# Patient Record
Sex: Female | Born: 1940 | Race: White | Hispanic: No | Smoking: Former smoker
Health system: Southern US, Community
[De-identification: ages and names within clinical notes are randomized; demographics above are authoritative.]

## PROBLEM LIST (undated history)

## (undated) DIAGNOSIS — I739 Peripheral vascular disease, unspecified: Secondary | ICD-10-CM

## (undated) DIAGNOSIS — K219 Gastro-esophageal reflux disease without esophagitis: Secondary | ICD-10-CM

## (undated) DIAGNOSIS — Z5189 Encounter for other specified aftercare: Secondary | ICD-10-CM

## (undated) DIAGNOSIS — I10 Essential (primary) hypertension: Secondary | ICD-10-CM

## (undated) DIAGNOSIS — E669 Obesity, unspecified: Secondary | ICD-10-CM

## (undated) DIAGNOSIS — Z936 Other artificial openings of urinary tract status: Secondary | ICD-10-CM

## (undated) DIAGNOSIS — E785 Hyperlipidemia, unspecified: Secondary | ICD-10-CM

## (undated) DIAGNOSIS — D649 Anemia, unspecified: Secondary | ICD-10-CM

## (undated) DIAGNOSIS — G629 Polyneuropathy, unspecified: Secondary | ICD-10-CM

## (undated) DIAGNOSIS — Z8679 Personal history of other diseases of the circulatory system: Secondary | ICD-10-CM

## (undated) DIAGNOSIS — Z89519 Acquired absence of unspecified leg below knee: Secondary | ICD-10-CM

## (undated) DIAGNOSIS — E13319 Other specified diabetes mellitus with unspecified diabetic retinopathy without macular edema: Secondary | ICD-10-CM

## (undated) DIAGNOSIS — IMO0001 Reserved for inherently not codable concepts without codable children: Secondary | ICD-10-CM

## (undated) DIAGNOSIS — Z8551 Personal history of malignant neoplasm of bladder: Secondary | ICD-10-CM

## (undated) DIAGNOSIS — H3530 Unspecified macular degeneration: Secondary | ICD-10-CM

## (undated) DIAGNOSIS — N183 Chronic kidney disease, stage 3 (moderate): Secondary | ICD-10-CM

## (undated) DIAGNOSIS — E1159 Type 2 diabetes mellitus with other circulatory complications: Secondary | ICD-10-CM

## (undated) DIAGNOSIS — I219 Acute myocardial infarction, unspecified: Secondary | ICD-10-CM

## (undated) DIAGNOSIS — I2510 Atherosclerotic heart disease of native coronary artery without angina pectoris: Secondary | ICD-10-CM

## (undated) HISTORY — PX: BACK SURGERY: SHX140

## (undated) HISTORY — PX: CORONARY STENT PLACEMENT: SHX1402

## (undated) HISTORY — PX: REVISION UROSTOMY CUTANEOUS: SUR1282

## (undated) HISTORY — PX: OTHER SURGICAL HISTORY: SHX169

## (undated) HISTORY — PX: ABDOMINAL HYSTERECTOMY: SHX81

## (undated) HISTORY — PX: CATARACT EXTRACTION W/ INTRAOCULAR LENS  IMPLANT, BILATERAL: SHX1307

---

## 1961-08-12 HISTORY — PX: APPENDECTOMY: SHX54

## 1998-04-18 ENCOUNTER — Ambulatory Visit (HOSPITAL_COMMUNITY): Admission: RE | Admit: 1998-04-18 | Discharge: 1998-04-18 | Payer: Self-pay | Admitting: Obstetrics and Gynecology

## 1998-10-25 ENCOUNTER — Other Ambulatory Visit: Admission: RE | Admit: 1998-10-25 | Discharge: 1998-10-25 | Payer: Self-pay | Admitting: Obstetrics and Gynecology

## 1998-11-30 ENCOUNTER — Other Ambulatory Visit: Admission: RE | Admit: 1998-11-30 | Discharge: 1998-11-30 | Payer: Self-pay | Admitting: Obstetrics and Gynecology

## 1999-05-14 ENCOUNTER — Other Ambulatory Visit: Admission: RE | Admit: 1999-05-14 | Discharge: 1999-05-14 | Payer: Self-pay | Admitting: Obstetrics and Gynecology

## 1999-10-19 ENCOUNTER — Ambulatory Visit (HOSPITAL_COMMUNITY): Admission: RE | Admit: 1999-10-19 | Discharge: 1999-10-19 | Payer: Self-pay | Admitting: *Deleted

## 1999-12-05 ENCOUNTER — Encounter: Payer: Self-pay | Admitting: General Surgery

## 1999-12-10 ENCOUNTER — Encounter (INDEPENDENT_AMBULATORY_CARE_PROVIDER_SITE_OTHER): Payer: Self-pay

## 1999-12-10 ENCOUNTER — Observation Stay (HOSPITAL_COMMUNITY): Admission: RE | Admit: 1999-12-10 | Discharge: 1999-12-11 | Payer: Self-pay | Admitting: General Surgery

## 1999-12-10 ENCOUNTER — Encounter: Payer: Self-pay | Admitting: General Surgery

## 1999-12-10 HISTORY — PX: LAPAROSCOPIC CHOLECYSTECTOMY: SUR755

## 2000-08-21 ENCOUNTER — Other Ambulatory Visit: Admission: RE | Admit: 2000-08-21 | Discharge: 2000-08-21 | Payer: Self-pay | Admitting: Obstetrics and Gynecology

## 2001-04-23 ENCOUNTER — Emergency Department (HOSPITAL_COMMUNITY): Admission: EM | Admit: 2001-04-23 | Discharge: 2001-04-23 | Payer: Self-pay | Admitting: *Deleted

## 2001-04-23 ENCOUNTER — Encounter: Payer: Self-pay | Admitting: Emergency Medicine

## 2001-06-17 ENCOUNTER — Other Ambulatory Visit: Admission: RE | Admit: 2001-06-17 | Discharge: 2001-06-17 | Payer: Self-pay | Admitting: Obstetrics and Gynecology

## 2001-06-26 ENCOUNTER — Encounter: Payer: Self-pay | Admitting: Obstetrics and Gynecology

## 2001-06-26 ENCOUNTER — Encounter: Admission: RE | Admit: 2001-06-26 | Discharge: 2001-06-26 | Payer: Self-pay | Admitting: Obstetrics and Gynecology

## 2003-03-03 ENCOUNTER — Encounter: Admission: RE | Admit: 2003-03-03 | Discharge: 2003-03-03 | Payer: Self-pay | Admitting: Gynecology

## 2003-03-03 ENCOUNTER — Encounter: Payer: Self-pay | Admitting: Gynecology

## 2003-03-13 ENCOUNTER — Inpatient Hospital Stay (HOSPITAL_COMMUNITY): Admission: EM | Admit: 2003-03-13 | Discharge: 2003-03-18 | Payer: Self-pay | Admitting: Emergency Medicine

## 2003-03-13 ENCOUNTER — Encounter: Payer: Self-pay | Admitting: Emergency Medicine

## 2003-03-14 ENCOUNTER — Encounter: Payer: Self-pay | Admitting: Internal Medicine

## 2003-03-16 ENCOUNTER — Encounter (INDEPENDENT_AMBULATORY_CARE_PROVIDER_SITE_OTHER): Payer: Self-pay | Admitting: *Deleted

## 2003-03-18 ENCOUNTER — Encounter: Payer: Self-pay | Admitting: Interventional Cardiology

## 2004-03-15 ENCOUNTER — Other Ambulatory Visit: Admission: RE | Admit: 2004-03-15 | Discharge: 2004-03-15 | Payer: Self-pay | Admitting: Gynecology

## 2006-10-28 ENCOUNTER — Other Ambulatory Visit: Admission: RE | Admit: 2006-10-28 | Discharge: 2006-10-28 | Payer: Self-pay | Admitting: Gynecology

## 2006-11-03 ENCOUNTER — Inpatient Hospital Stay (HOSPITAL_COMMUNITY): Admission: EM | Admit: 2006-11-03 | Discharge: 2006-11-07 | Payer: Self-pay | Admitting: *Deleted

## 2006-11-05 ENCOUNTER — Encounter (INDEPENDENT_AMBULATORY_CARE_PROVIDER_SITE_OTHER): Payer: Self-pay | Admitting: *Deleted

## 2006-11-05 HISTORY — PX: OTHER SURGICAL HISTORY: SHX169

## 2006-11-14 ENCOUNTER — Encounter: Admission: RE | Admit: 2006-11-14 | Discharge: 2006-11-14 | Payer: Self-pay | Admitting: Gynecology

## 2006-12-12 ENCOUNTER — Ambulatory Visit (HOSPITAL_COMMUNITY): Admission: RE | Admit: 2006-12-12 | Discharge: 2006-12-12 | Payer: Self-pay | Admitting: Orthopaedic Surgery

## 2006-12-29 ENCOUNTER — Encounter (INDEPENDENT_AMBULATORY_CARE_PROVIDER_SITE_OTHER): Payer: Self-pay | Admitting: Orthopaedic Surgery

## 2006-12-30 ENCOUNTER — Inpatient Hospital Stay (HOSPITAL_COMMUNITY): Admission: AD | Admit: 2006-12-30 | Discharge: 2006-12-31 | Payer: Self-pay | Admitting: Orthopaedic Surgery

## 2007-01-22 ENCOUNTER — Ambulatory Visit (HOSPITAL_COMMUNITY): Admission: RE | Admit: 2007-01-22 | Discharge: 2007-01-22 | Payer: Self-pay | Admitting: Orthopedic Surgery

## 2007-01-30 ENCOUNTER — Inpatient Hospital Stay (HOSPITAL_COMMUNITY): Admission: AD | Admit: 2007-01-30 | Discharge: 2007-02-03 | Payer: Self-pay | Admitting: Orthopaedic Surgery

## 2007-03-24 ENCOUNTER — Ambulatory Visit (HOSPITAL_COMMUNITY): Admission: RE | Admit: 2007-03-24 | Discharge: 2007-03-25 | Payer: Self-pay | Admitting: Orthopaedic Surgery

## 2007-04-17 ENCOUNTER — Encounter (HOSPITAL_BASED_OUTPATIENT_CLINIC_OR_DEPARTMENT_OTHER): Admission: RE | Admit: 2007-04-17 | Discharge: 2007-07-16 | Payer: Self-pay | Admitting: Surgery

## 2007-05-01 ENCOUNTER — Inpatient Hospital Stay (HOSPITAL_COMMUNITY): Admission: AD | Admit: 2007-05-01 | Discharge: 2007-05-06 | Payer: Self-pay | Admitting: Orthopaedic Surgery

## 2007-05-02 ENCOUNTER — Encounter (INDEPENDENT_AMBULATORY_CARE_PROVIDER_SITE_OTHER): Payer: Self-pay | Admitting: Orthopaedic Surgery

## 2007-05-02 HISTORY — PX: BELOW KNEE LEG AMPUTATION: SUR23

## 2007-09-14 ENCOUNTER — Encounter: Admission: RE | Admit: 2007-09-14 | Discharge: 2007-12-10 | Payer: Self-pay | Admitting: Orthopaedic Surgery

## 2010-11-26 ENCOUNTER — Other Ambulatory Visit: Payer: Self-pay | Admitting: Urology

## 2010-11-26 ENCOUNTER — Ambulatory Visit (HOSPITAL_BASED_OUTPATIENT_CLINIC_OR_DEPARTMENT_OTHER)
Admission: RE | Admit: 2010-11-26 | Discharge: 2010-11-26 | Disposition: A | Discharge status: Home / Self Care | Payer: MEDICARE | Source: Ambulatory Visit | Attending: Urology | Admitting: Urology

## 2010-11-26 DIAGNOSIS — D090 Carcinoma in situ of bladder: Secondary | ICD-10-CM | POA: Insufficient documentation

## 2010-11-26 DIAGNOSIS — I739 Peripheral vascular disease, unspecified: Secondary | ICD-10-CM | POA: Insufficient documentation

## 2010-11-26 DIAGNOSIS — G609 Hereditary and idiopathic neuropathy, unspecified: Secondary | ICD-10-CM | POA: Insufficient documentation

## 2010-11-26 DIAGNOSIS — E669 Obesity, unspecified: Secondary | ICD-10-CM | POA: Insufficient documentation

## 2010-11-26 DIAGNOSIS — Z79899 Other long term (current) drug therapy: Secondary | ICD-10-CM | POA: Insufficient documentation

## 2010-11-26 DIAGNOSIS — K219 Gastro-esophageal reflux disease without esophagitis: Secondary | ICD-10-CM | POA: Insufficient documentation

## 2010-11-26 DIAGNOSIS — Z01812 Encounter for preprocedural laboratory examination: Secondary | ICD-10-CM | POA: Insufficient documentation

## 2010-11-26 DIAGNOSIS — I10 Essential (primary) hypertension: Secondary | ICD-10-CM | POA: Insufficient documentation

## 2010-11-26 DIAGNOSIS — Z01810 Encounter for preprocedural cardiovascular examination: Secondary | ICD-10-CM | POA: Insufficient documentation

## 2010-11-26 DIAGNOSIS — E109 Type 1 diabetes mellitus without complications: Secondary | ICD-10-CM | POA: Insufficient documentation

## 2010-11-26 LAB — POCT I-STAT 4, (NA,K, GLUC, HGB,HCT)
Glucose, Bld: 99 mg/dL (ref 70–99)
HCT: 36 % (ref 36.0–46.0)
Hemoglobin: 12.2 g/dL (ref 12.0–15.0)
Potassium: 4.3 mEq/L (ref 3.5–5.1)
Sodium: 145 mEq/L (ref 135–145)

## 2010-11-26 LAB — GLUCOSE, CAPILLARY: Glucose-Capillary: 106 mg/dL — ABNORMAL HIGH (ref 70–99)

## 2010-11-29 NOTE — Op Note (Signed)
[name], [name]              ACCOUNT NO.:  [id number]  MEDICAL RECORD NO.:  [id number]            PATIENT TYPE:  LOCATION:                                 FACILITY:  PHYSICIAN:  Emilyanne Mcgough C. Vernie Ammons, M.D.       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  PROCEDURE:  Cystoscopy with transurethral resectional biopsy of bladder lesion and cold cup bladder biopsies.  SURGEON:  Michell Kader C. Vernie Ammons, M.D.  ANESTHESIA:  General.  SPECIMENS: 1. Cold cup biopsies from the posterior wall and floor of the bladder. 2. Resectional biopsies of 2 lesions within the bladder measuring     approximately 5 cm.  BLOOD LOSS:  None.  DRAINS:  None.  COMPLICATIONS:  None.  INDICATIONS:  The patient is a 70 year old female who I initially saw in January 2012.  She was having recurrent E-coli UTIs and her workup included cystoscopy which revealed erythematous patches in various locations of her bladder that was concerning for CIS but because she had had recent infections and her cytology was negative, I elected to repeat her cystoscopy in the office which was done on November 20, 2010.  At that time, there was an area on the floor of the bladder that appeared somewhat suspicious and cytology proved positive for malignant cells and therefore she is brought to the operating room for surgical treatment. The risks, complications, alternatives and limitations were discussed and she understands and elected to proceed.  DESCRIPTION OF OPERATION:  After informed consent, the patient was brought to the major OR, placed on table, administered general anesthesia, then moved to the dorsal lithotomy position.  Her genitalia was sterilely prepped and draped  and an official time-out was then performed.  The 28-French resectoscope sheath with Timberlake obturator was then introduced in the bladder.  The obturator removed and the resectoscope element with 12-degree lens inserted.  The bladder was fully and systematically inspected.  I noticed an erythematous region on the floor of the bladder involving the trigone and approaching the left ureteral orifice but not involving this.  The lesion essentially covered the left hemi-trigone.  There was also an erythematous region on the posterior wall with what appeared to be early papillary lesion of about 1 mm noted as well.  I obtained cold cup biopsies from the posterior lesion and from the floor of the bladder and sent these separately as such.  I then used the resectoscope to resect away the abnormal region on the floor the bladder and the posterior wall of the bladder.  I then fulgurated the surrounding mucosa.  Reinspection of the bladder then revealed no further worrisome lesions identifiable.  I therefore used the Microvasive evacuator to remove all of the portions of bladder lesions that were resected and these were sent to pathology separately. No perforation or bleeding was noted and I therefore drained the bladder and removed the resectoscope.  The patient was awakened and taken to the recovery room in stable and satisfactory condition.  She tolerated the procedure well.  She will be given  written instructions as well as a prescription for Vicodin HP #30 and Pyridium 200 mg #36, with followup in my office in 1 week.     Marysa Wessner C. Vernie Ammons, M.D.     MCO/MEDQ  D:  11/26/2010  T:  11/26/2010  Job:  [id number]  Electronically Signed by Ihor Gully M.D. on 11/29/2010 07:40:20 PM

## 2010-12-25 NOTE — Op Note (Signed)
NAMEROBERTINE, [name]              ACCOUNT NO.:  [id number]   MEDICAL RECORD NO.:  [id number]          PATIENT TYPE:  INP   LOCATION:  5001                         FACILITY:  [facility]   PHYSICIAN:  Vanita Panda. Magnus Ivan, M.D.DATE OF BIRTH:  19-May-1941   DATE OF PROCEDURE:  05/02/2007  DATE OF DISCHARGE:                               OPERATIVE REPORT   PREOPERATIVE DIAGNOSIS:  Peripheral vascular disease and chronic  nonhealing wounds with infection, right foot.   POSTOPERATIVE DIAGNOSIS:  Peripheral vascular disease and chronic  nonhealing wounds with infection, right foot.   PROCEDURE:  Right below-knee amputation.   SURGEON:  Vanita Panda. Magnus Ivan, M.D.   ANESTHESIA:  General.   ANTIBIOTICS:  1 g of IV Ancef.   TOURNIQUET TIME:  29 minutes.   BLOOD LOSS:  75 mL.   COMPLICATIONS:  None.   INDICATIONS:  [name] is a 70 year old female patient, well known to  me.  She has a long history of diabetes and peripheral vascular disease  and she has had chronic wound involving her right foot.  She has had  multiple irrigations and debridements and had to remove a considerable  amount of her bone from her foot.  We tried to get this to heal through  [facility] and hyper[facility]ic oxygen and were unsuccessful  in getting this to heal.  She presented with worsening infection from  multiple areas of the foot, and there was concern with this.  I talked  to her about a below-knee amputation.  The risks and benefits of this  were explained and well understood, and she agreed to proceed with  surgery.   PROCEDURE DESCRIPTION:  After informed consent was obtained, the  appropriate right leg was marked.  [name] was brought to the  operating room and placed supine on the operating table.  General  anesthesia was then obtained.  A nonsterile tourniquet was placed around  her upper leg.  Her leg was prepped and draped with DuraPrep and sterile  drapes including sterile  stockinette over the foot.  A timeout was  called.  She was identified as the correct patient, correct extremity.  I then used an Esmarch to wrap the leg and tourniquet was inflated to  350 mL of pressure.   Next I made an incision approximately about 12 cm distal to the tibial  tubercle and carried this in a fishmouth fashion medially and laterally  and then with a long posterior flap.  I then used a Cobb elevator to  raise the periosteum off the bone about 2 inches more proximal than the  skin incision.  I then used an oscillating saw to make a tibial cut and  feathered the end of the tibia.  Then I made a cut into the fibula  approximately 1-2 cm proximal to the tibia cut.  A larger blade was used  to remove the remainder of the leg, and it was passed off the table.  I  then used soft tissue scissors to debulk the muscle.  The neurovascular  bundles were all identified and the nerves were cut  to allow retraction  of the deep tissues.  I used 2-0 silk sutures to ligate the vessels.  The tourniquet was let down at 29 minutes, and the remainder of the  tissue did bleed.  I then tried to fashion the stump as best I could for  a cosmetic appearance, and I closed the fascial planes with interrupted  0 Vicryl suture followed by 2-0 Vicryl in the subcutaneous tissue and  staples on the skin.  Xeroform followed by a well-padded sterile  dressing were applied.  The patient was awakened, extubated and taken to  the recovery room in stable condition.      Vanita Panda. Magnus Ivan, M.D.  Electronically Signed     CYB/MEDQ  D:  05/02/2007  T:  05/03/2007  Job:  [id number]

## 2010-12-25 NOTE — Op Note (Signed)
NAMEBONNI, [name]              ACCOUNT NO.:  [id number]   MEDICAL RECORD NO.:  [id number]          PATIENT TYPE:  OIB   LOCATION:  5713                         FACILITY:  [facility]   PHYSICIAN:  Vanita Panda. Magnus Ivan, M.D.DATE OF BIRTH:  1941-05-09   DATE OF PROCEDURE:  03/24/2007  DATE OF DISCHARGE:                               OPERATIVE REPORT   PREOPERATIVE DIAGNOSIS:  Right chronic foot wound with exposed bone.   POSTOPERATIVE DIAGNOSIS:  Right chronic foot wound with necrotic  navicular bone.   PROCEDURE:  1. Irrigation and debridement of right foot wound using pulsatile      lavage, including removing of necrotic bone.  2. Delayed primary closure of right foot wound.   SURGEON:  Doneen Poisson, MD   ANESTHESIA:  IV sedation.   BLOOD LOSS:  Minimal.   COMPLICATIONS:  None.   ANTIBIOTICS:  600 mg IV clindamycin.   INDICATIONS:  Briefly [name] is a 70 year old whom I have been  following for some time with chronic wounds involving her right foot.  She did have active infection which cleared, but then she also had a  Charcot changes in the foot and had significant breakdown.  She then  opened up the medial wound and I have been debriding this in the office  and one time of surgery.  She developed continued breakdown of the  wound.  I recommended that she be brought to the operating room for a  thorough irrigation and debridement and likely vac sponge placement.  The risks, benefits of this were explained to her and well understood.  She agreed to proceed with surgery.   PROCEDURE:  After informed consent was obtained, the appropriate right  ankle was marked and she was brought to the operating room, placed  supine on the operating table.  Her foot was then prepped and draped  with scrub and paint.  A time-out was called.  She was identified as  correct patient, correct extremity.  IV sedation was on standby as she  had minimal pain.  Navicular that was  exposed was assessed and found to  be necrotic, so I removed this entirely using a rongeur as well as  scalpel.  I then used the scalpel to debride the edges of the tissue and  got good bleeding bone.  Using pulsatile lavage, I then thoroughly  irrigated the wound with 1500 mL of normal saline solution followed by  500 mL of bacitracin solution and again normal saline solution.  I then  was able to his  surprisingly loosely closed the wound with interrupted 2-0 nylon suture  using far-near-near-far format to get the wound closed.  Xeroform  followed by well-padded sterile dressing and Coban were placed over the  wound.  The patient was taken to recovery room in stable condition.      Vanita Panda. Magnus Ivan, M.D.  Electronically Signed     CYB/MEDQ  D:  03/24/2007  T:  03/25/2007  Job:  [id number]

## 2010-12-25 NOTE — Op Note (Signed)
[name], [name]              ACCOUNT NO.:  [id number]   MEDICAL RECORD NO.:  [id number]          PATIENT TYPE:  INP   LOCATION:  5029                         FACILITY:  [facility]   PHYSICIAN:  Vanita Panda. Magnus Ivan, M.D.DATE OF BIRTH:  23-Apr-1941   DATE OF PROCEDURE:  01/12/2007  DATE OF DISCHARGE:  12/31/2006                               OPERATIVE REPORT   PREOPERATIVE DIAGNOSIS:  Retained infection right foot status post great  toe amputation.   POSTOPERATIVE DIAGNOSIS:  Retained infection right foot status post  great toe amputation.   PROCEDURE:  Right foot first ray resection.   SURGEON:  Doneen Poisson, MD   ANESTHESIA:  Local ankle block with IV sedation.   FINDINGS:  Gross purulence first metatarsal head right foot.   BLOOD LOSS:  Minimal.   COMPLICATIONS:  None.   INDICATIONS:  Briefly Ms. Braaten is a 70 year old diabetic who underwent  a previous right great toe amputation due to her infection.  She  presents now with continued drainage at the level of the proximal  phalanx metatarsal MCP joint area.  It was recommend she undergo first  ray amputation.  The risks, benefits of this were explained and well  understood.  She agreed to proceed surgery.   PROCEDURE:  Informed consent obtained and appropriate right foot was  marked, she was brought to the operating room and placed supine on the  operative table.  Ankle had already been obtained by anesthesia.  I then  removed her previous sutures and extended incision back to expose the  first metatarsal head.  There was gross presence noted and this  dissected back to viable bone above the mid metatarsal area  using bone  cutting forceps I was able to resect the metatarsal and I sent off the  metatarsal head to pathology for identification of osteomyelitis.  The  wound was then copiously irrigated and closed and refashioned tissue to  close as a flap over the exposed metatarsal head.  This was closed with  interrupted 2-0 nylon suture.  Well-padded sterile dressing was applied.  She tolerated procedure well without complications.  She was taken to  recovery room in stable condition.      Vanita Panda. Magnus Ivan, M.D.  Electronically Signed     CYB/MEDQ  D:  01/12/2007  T:  01/13/2007  Job:  [id number]

## 2010-12-25 NOTE — Assessment & Plan Note (Signed)
[facility]   [name]              ACCOUNT NO.:  [id number]   MEDICAL RECORD NO.:  [id number]      DATE OF BIRTH:  [birth year]   PHYSICIAN:  Theresia Majors. Tanda Rockers, M.D. VISIT DATE:  04/21/2007                                   OFFICE VISIT   SUBJECTIVE:  [name] is a 70 year old lady referred by Dr.  Doneen Poisson for evaluation of a chronic wound on the right  foot.   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   RECOMMENDATION:  The wound was cultured in the clinic and a satellite  abscess was incised and drained. We started the patient on doxycycline  and Septra in the interim.  We are recommending that we proceed with  open management and a Cam walker and plan to proceed with hyperbaric  oxygen therapy as soon as a dive slot becomes available.   SUBJECTIVE:  [name] has had neuropathic ulceration involving the  right hallux starting in March 2008.  She has subsequently required  resection of the hallux as well as portions of the mid foot bones.  She  progressed to develop frank Charcot changes with collapse of the mid  foot and has been followed aggressively by Dr. Magnus Ivan.  In spite of  major efforts she has failed to completely heal her wound.  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   PAST MEDICAL HISTORY:  Remarkable for chronic obstructive pulmonary  disease, hypertension, reflux esophagitis, macular degeneration and  glaucoma.   ALLERGIES:  She is allergic to Biaxin.   CURRENT MEDICATIONS:  Include lisinopril, hydrochlorothiazide 20/12.5,  Benicar 20 mg daily, metoprolol 75 mg b.i.d. Glucophage a gram once  b.i.d., omeprazole 20 mg two daily.  Caduet 5/40 1 daily, Percocet 5/325  one to two q.6-8 h p.r.n.  She also takes eye drop for glaucoma.   PAST SURGICAL HISTORY:  Her surgery has included cataract extractions  bilaterally, back surgery in 1971 and  1991.  Cholecystectomy 2002 and  most recently an amputated right toe November 05, 2006.   FAMILY HISTORY:  Positive for hypertension, diabetes, stroke and heart  attack.   SOCIAL HISTORY:  Socially she is separated.  She has one adult daughter  who lives locally.  She is retired due to her back difficulties.   REVIEW OF SYSTEMS:  She specifically denies dyspnea on exertion angina  pectoris.  She has lost 30 pounds over the last year as a result of poor  appetite.  She denies syncope.  She denies any symptoms suggestive of  TIA's or extracranial occlusive disease.  She habitually takes a  laxative.  The remainder review of systems is negative.   PHYSICAL EXAMINATION:  On physical exam she is an anxious but pleasant  female in no acute distress.  Her blood pressure is 135/62, respirations  18, pulse rate is 85, temperature is 98.7, hemoglobin A1c is 7.3.  The  HEENT exam is clear.  Neck is supple.  Trachea is midline.  Thyroid is  nonpalpable.  Lungs: Clear.  The heart sounds were distant.  Abdomen is  soft.  Extremity exam is remarkable for bilateral two to three plus  edema. On the  right foot on the medial aspect there are two wounds, one  distally, one proximally. Neither which need debridement. Wound #1  distally on the foot was fluctuant. We performed an incision and  drainage with excision of the redundant tissue and a culture. This wound  appeared to be an old hematoma that have been secondarily infected.  It  was restricted to the subcutaneous tissue.  There was no evidence of  extension into the bone.  Wound #3 on the right lateral proximal foot  did not need debridement. Wound #4 on the right lateral distal foot had  a firm eschar. No drainage, no exudate wound. The entirety foot was  edematous with postsurgical changes consistent with osteectomies  described in Dr. Eliberto Ivory op note.  Wound #5 of the left fourth toe  dorsally was clean with healthy granulation and appeared to  be related  to trauma.  There was no evidence of ascending infection.  The patient  is insensate.  There are bilateral bounding 3+ dorsalis pedis pulses.  The thermistor shows the left foot to be 80.2 degrees and the right foot  is 87.   DISCUSSION:  [name] is a diabetic who is under reasonable control  but has developed Charcot complication related to an inflammatory focus  of the right foot.  In this setting we have started her on broad-  spectrum methicillin-resistant staph coverage using Septra and  doxycycline.  We will place her in a Cam walker to offload the foot  completely.  We will reevaluate her in 3 days.  If we can get her to the  point where we see some response with decrease in her temperature and we  feel confident that she has no active abscess ongoing, we will recommend  we put her in a total contact cast and proceed with hyperbaric oxygen  treatment.  We have discussed this treatment plan with the patient in  terms that she seems to understand.  She expresses gratitude for having  been seen in the clinic and indicates that she will comply as per above.  We will proceed with an aqua cell silver dressing, a Kerlix and 4x4s.  The Cam walker will be placed.  The patient has been counseled regarding  the development of increasing swelling or fever.  If this should occur  she is to call the clinic or to be seen in the emergency room.  The  patient was given an opportunity to ask questions.  She seems to  understand and indicates that she will be compliant.      Harold A. Tanda Rockers, M.D.  Electronically Signed     HAN/MEDQ  D:  04/21/2007  T:  04/22/2007  Job:  [id number]   cc:   Vanita Panda. Magnus Ivan, M.D.  Lyndee Leo. Janey Greaser, MD

## 2010-12-25 NOTE — Assessment & Plan Note (Signed)
[facility]   NAME:  [name], [name]              ACCOUNT NO.:  [id number]   MEDICAL RECORD NO.:  [id number]      DATE OF BIRTH:  1940/08/30   PHYSICIAN:  Maxwell Caul, M.D. VISIT DATE:  04/24/2007                                   OFFICE VISIT   [name] is seen today in followup from her visit of September 9.  She has a Charcot foot with Wagner's grade 4 diabetic ulcers.  Last time  she was here, she had Aquacel silver of applied to all of her wounds.  She was put on the [facility] waiting list.   WOUND EXAM:  Temperature is 96.  We defined four areas on her right foot.  What we had previously labeled  as #3 appears to have healed over.  However, she has extensive  ulcerations labeled 1 and 2 on the medial foot and on the right lateral  foot that we have labeled #4.  All three of these wounds as needed  extensive debridement using #15 blade.  We used EMLA for anesthesia.  Hemostasis was with direct pressure.  None of these appear to be  particularly infected.  There was nothing that needed culturing.   IMPRESSION:  Wagner's grade 4 diabetic wounds.  We reapplied Aquacel AG  to the wounds that we had previously labeled 1, 2, and 4.  We have re-  placed her in a Cam walker as mentioned.  The area to her left second  toe we just covered with __________ and continued in a healing sandal.  She will await hyperbaric oxygen.  There is fair amount of edema in the  right foot.  However, I think this is probably chronic.  She continues  on antibiotics.           ______________________________  Maxwell Caul, M.D.     MGR/MEDQ  D:  04/24/2007  T:  04/25/2007  Job:  [id number]

## 2010-12-25 NOTE — Op Note (Signed)
NAMEEDYNN, GILLOCK              ACCOUNT NO.:  [id number]   MEDICAL RECORD NO.:  [id number]          PATIENT TYPE:  INP   LOCATION:  5029                         FACILITY:  [facility]   PHYSICIAN:  Vanita Panda. Magnus Ivan, M.D.DATE OF BIRTH:  August 12, 1941   DATE OF PROCEDURE:  01/30/2007  DATE OF DISCHARGE:                               OPERATIVE REPORT   PREOPERATIVE DIAGNOSIS:  Right foot draining wound with Charcot changes  and questionable osteomyelitis.   POSTOPERATIVE DIAGNOSIS:  Right foot draining wound with Charcot changes  and questionable osteomyelitis.   PROCEDURE:  Irrigation and debridement of right foot with excision of  necrotic bone.   FINDINGS:  No gross purulence, midfoot collapse, questionable necrotic  medial cuneiform.   SURGEON:  Vanita Panda. Magnus Ivan, MD   ANESTHESIA:  General.   ANTIBIOTICS:  1 g vancomycin.   BLOOD LOSS:  Minimal.   TOURNIQUET TIME:  12 minutes.   INDICATIONS:  Briefly, Ms. Chancellor is a 70 year old patient well-known to  me.  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  She originally developed an infection in her right great  toe a couple months ago that led to amputation the tip of the great toe  after continued infection.  I debrided this in the office and then  eventually took her for a first ray amputation.  I allowed her to  weightbear as tolerated on this and she was doing quite well until about  a week ago, she developed neuropathic changes in her foot.  She was seen  by one of my partners in the office, who obtained an MRI and the MRI  with findings consistent with evidence more so of Charcot changes than  infection, and her white blood cell count was 8000.  She returned to the  office today and had a temperature of 100.2 and draining from a small  wound in her foot and she had further collapse of the midfoot on the  medial side with pressure changes from the collapse.  With an elevated  temperature, the white blood cell count was checked and it was found to  be 15,000.  I have recommended that she undergo irrigation and  debridement of the foot, started on IV antibiotics and with debridement  of necrotic bone.  The risks and benefits of this were explained to her  and well-understood, and she agreed to proceed with surgery.   PROCEDURE DESCRIPTION:  After informed consent was obtained, the  appropriate right leg was marked and she was brought to the operating  room and placed supine on the operating table.  General anesthesia was  then obtained, a nonsterile tourniquet placed around her upper right  leg.  Her leg was prepped and draped with Betadine scrub and paint.  Prior to elevating the tourniquet, a time-out was called and she was  identified as the correct patient and correct extremity, and I then  elevated the tourniquet to 300 mm of pressure.  I made an incision  directly in the midline of the foot over the medial aspect over the bony  prominence.  This was near the open wound.  I took this down to the  medial cuneiform area of the foot and the navicular.  There was noted  collapse and potential changes of osteomyelitis.  There was no gross  purulence noted.  I used an osteotome and a rongeur to debride the bone  back to a stable margins so there was no sharp bony prominence.  I then  irrigated the tissue with 300 mL of normal saline solution using  pulsatile lavage and then 500 mL of bacitracin solution.  The tourniquet  was let down at 12 minutes and hemostasis was easily obtained.  I closed  the skin with interrupted 2-0 nylon suture in a loose format.  Xeroform  followed by a well-padded sterile dressing was applied to the foot.  The  patient was awakened and extubated in stable condition.  All final  counts were correct and there were no complications noted.  Postoperatively I will keep her on IV antibiotics with strict elevation  of her foot and strict  instructions for nonweightbearing.  We will start  physical therapy as well.      Vanita Panda. Magnus Ivan, M.D.  Electronically Signed     CYB/MEDQ  D:  01/30/2007  T:  01/31/2007  Job:  [id number]

## 2010-12-25 NOTE — Assessment & Plan Note (Signed)
[facility]   NAMESHANDRIKA, [name]              ACCOUNT NO.:  [id number]   MEDICAL RECORD NO.:  [id number]      DATE OF BIRTH:  01-11-1941   PHYSICIAN:  Theresia Majors. Tanda Rockers, M.D. VISIT DATE:  04/30/2007                                   OFFICE VISIT   SUBJECTIVE:  [name] is a 70 year old lady who we were following in  the wound center for severe Charcot arthropathy and diabetic foot ulcer  Wagner grade 4.  She was initially seen on September 9 in referral from  Dr. Magnus Ivan.  She was treated with a CAM walker to facilitate  offloading and the patient was scheduled to begin hyperbaric oxygen  treatment as soon as a dive slot was available.  In the interim, she has  complained of drainage and local warmth to the foot.  She is not  complaining of pain.  Her sugars have been well-controlled and  consistent with her previous targets.  There has been no malodor.   OBJECTIVE:  Blood pressure is 124/74, respirations 18, pulse 76,  temperature is 98.6.  Inspection of the right lower extremity shows that there is mild 1+  edema on the pretibial level. At the foot, however, there is moderate  swelling with severe instability.  On the volar aspect of the foot,  there is an expressible bloody drainage which expelled old fragments of  bone, which were placed into a specimen vile and  forwarded to  pathology.  There is no particular malodor. Upon probing with the Q-tip,  this sinus on the volar aspect of the foot extends into the inner  osseous level of approximately 4 cm in the vertical projection and  laterally 5-6 cm circumferentially. The bones are is unstable and easily  expressed into the open wound. The capillary refill is brisk and there  is no evidence of petechiae or arterial insufficiency.  The pedal pulse  is palpable.  A medial wound on the right foot has a healthy-appearing  scar. Wound #4 on the right lateral distal foot is similarly covered  with a healthy  eschar. These wounds were measured, cataloged and  photographed, please refer to the data entries. The thermistor shows an  85 degree temperature on the right foot and a 78 temperature on the  left.   The patient's preoperative labs included a chest x-ray performed August  8 which was clear. Hemoglobin on June 22 was 8.2 with an hematocrit of  24. Electrolytes were normal with a creatinine of 0.85. Was mild  cardiomegaly on the chest film, no active lung disease.   ASSESSMENT:  Ms. Byer has had deterioration of her foot.  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  The  cavity is draining with the expulsion of the bony fragments. This area  of sinus was irrigated and a quarter inch Nu Gauze  packing was placed.  We applied a Kerlix around the foot and we will continue to protect the  foot with total offloading using a CAM walker.  She will continue on the  doxycycline and the Septra.  We will ask Dr. Magnus Ivan to reevaluate the  patient and her medical physician who is yet to be determined. We will  ask  that the patient be admitted to the hospital for possible parenteral  antibiotics with consultation with Dr. Magnus Ivan.  We will put on hold  her hyperbaric oxygen treatment until Dr. Magnus Ivan has had an  opportunity to reassess this patient.  We have explained the nature of  this lesion, our impression that there has been retrogression.  We have discussed the potential for limb loss as a result of the acute  deterioration and the patient seems to understand.  We have given her an  opportunity to ask questions.  She expresses gratitude for having been  seen in the clinic and indicates that she will be compliant.      Harold A. Tanda Rockers, M.D.  Electronically Signed     HAN/MEDQ  D:  04/30/2007  T:  04/30/2007  Job:  [id number]   cc:   Abigail Miyamoto, M.D.

## 2010-12-25 NOTE — Discharge Summary (Signed)
[name], [name]              ACCOUNT NO.:  [id number]   MEDICAL RECORD NO.:  [id number]          PATIENT TYPE:  INP   LOCATION:  5001                         FACILITY:  [facility]   PHYSICIAN:  Vanita Panda. Magnus Ivan, M.D.DATE OF BIRTH:  08/06/1941   DATE OF ADMISSION:  05/01/2007  DATE OF DISCHARGE:  05/06/2007                               DISCHARGE SUMMARY   ADMITTING DIAGNOSIS:  Right foot chronic infection.   DISCHARGE DIAGNOSIS:  Right foot chronic infection.   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   HOSPITAL COURSE:  Briefly, [name] is a 70 year old female with  diabetes who had a long history of right foot ischemia.  This all  started with the diabetic ulcer on her left great toe, and after  multiple surgeries this developed worsening areas of infection around  her foot.  We tried treatments at the wound treatment center, and it got  to the point where the infection was overwhelming.  It was recommended  that she undergo below-knee amputation due to the inability to heal  these wounds.  The risks and benefits of this were explained to her and  well understood.  She agreed to proceed with surgery.  She was taken and  admitted on May 01, 2007 from the clinic and started on IV  antibiotics.  She was then taken to the operating room May 04, 2007 where she underwent a right below-knee amputation without  complications.  She did have some acute blood loss anemia following  surgery, and this did require transfusion.  Following surgery we started  working her aggressively with physical therapy and occupational therapy  for mobilization and activities of daily living, and by the day of  discharge she was cleared for discharge to home with home health PT and  OT which was  recommended.  Her hemoglobin improved with transfusion,  and her BKA stump was found to be clean, dry, and intact.   DISPOSITION:  To home.   DISCHARGE INSTRUCTIONS:   While she is at home she will continue to work  with physical therapy and occupational therapy for mobilization.  Follow-  up will be established in the office in two weeks after discharge.   DISCHARGE MEDICATIONS:  1. Percocet 1-2 p.o. q.4-6 h. p.r.n.  2. She will continue all of her same other medications as she was on      preoperative and at home.      Vanita Panda. Magnus Ivan, M.D.  Electronically Signed     CYB/MEDQ  D:  05/28/2007  T:  05/29/2007  Job:  [id number]

## 2010-12-25 NOTE — Op Note (Signed)
[name], [name]              ACCOUNT NO.:  [id number]   MEDICAL RECORD NO.:  [id number]          PATIENT TYPE:  OIB   LOCATION:  5029                         FACILITY:  [facility]   PHYSICIAN:  Vanita Panda. Magnus Ivan, M.D.DATE OF BIRTH:  1940/09/06   DATE OF PROCEDURE:  12/29/2006  DATE OF DISCHARGE:                               OPERATIVE REPORT   PREOPERATIVE DIAGNOSIS:  Right diabetic foot infection status post great  toe amputation.   POSTOPERATIVE DIAGNOSIS:  Right foot first metatarsal head infection.   PROCEDURE:  Irrigation debridement of right foot with first ray  resection through the mid metatarsals.   SURGEON:  Vanita Panda. Magnus Ivan, MD.   ANESTHESIA:  1. Right ankle block/regional.  2. IV sedation.   ANTIBIOTICS:  IV clindamycin 600 mg.   BLOOD LOSS:  50 mL.   COMPLICATIONS:  None.   INDICATIONS:  [name] is a 71 year old diabetic who developed, about  2 months ago, infection of the tip of her great toe.  At the time I did  just a great toe amputation through the distal phalanx, barely into the  proximal phalanx.  She seemed to be healing this nicely but her blood  sugars remained elevated and she developed erythema and drainage from  this.  I took her back to just some stump of the proximal phalanx in the  operating room and this was actually appeared to heal nicely.  I saw her  even last week and the wound was dry, there was no erythema and she was  doing well.  She then presented to the office today and had drainage  from this again with erythema about her foot.  X-rays showed osteolysis  of the proximal phalanx at the MTP joint and concerning for further  flexion of the metatarsal, I recommended the first ray resection.  The  risks and benefits of this were explained to her and well understood.  She agreed to procedure with surgery.   DESCRIPTION OF PROCEDURE:  After informed consent was obtained,  appropriate right leg was marked and ankle block  was obtained by  anesthesia.  She  was brought to the operating room and placed supine on  the operating table.  IV sedation was obtained.  Her foot was prepped  and draped with Betadine scrub and paint.  I used a tower around the  ankle and then an Esmarch as a local tourniquet.  I then proceeded with  a fishmouth type incision of the first metatarsal and did account some  gross purulence in the metatarsal head.  I then resected the metatarsal  back to about the mid diaphysis the area and there was nice appearing  bone in this area.  I cleaned all the soft tissues and let the Esmarch  down at about 20 minutes.  Abundant bleeding was noted.  I then closed  deep tissue nicely over the bone with 2-0 Vicryl suture and I was to use  interrupted 2-0 nylon to close the skin over the metatarsal with no  tightness on this whatsoever.  A compressive dressing was then applied  and the  patient was taken to the recovery room in stable condition.  Xeroform was also applied.  There were no complications noted.  She is  being admitted for extended outpatient recovery for IV antibiotics and  elevation.      Vanita Panda. Magnus Ivan, M.D.  Electronically Signed     CYB/MEDQ  D:  12/29/2006  T:  12/30/2006  Job:  [id number]

## 2010-12-25 NOTE — Discharge Summary (Signed)
NAMESYRIANNA, SCHILLACI              ACCOUNT NO.:  [id number]   MEDICAL RECORD NO.:  [id number]          PATIENT TYPE:  INP   LOCATION:  5029                         FACILITY:  [facility]   PHYSICIAN:  Vanita Panda. Magnus Ivan, M.D.DATE OF BIRTH:  10/25/1940   DATE OF ADMISSION:  12/30/2006  DATE OF DISCHARGE:  12/31/2006                               DISCHARGE SUMMARY   ADMITTING DIAGNOSIS:  Retained infection in right foot status post great  toe amputation.   DISCHARGE DIAGNOSIS:  Retained infection in right foot status post great  toe amputation.   PROCEDURE:  Right foot first ray resection.   HOSPITAL COURSE:  Ms. Pala is a 70 year old diabetic who underwent a  previous right great toe amputation due to infection at the tip of her  toe.  She now presents with a continued drainage at the level of the  proximal phalanx metatarsal MCP joint areas.  It was recommended that  she undergo a first ray amputation.  The risks and benefits of this were  explained to her _____  to proceed with surgery.  She was taken to the  operating room on the day of admission, where she underwent a first ray  resection of the right foot without complications.  She was admitted for  extended outpatient recovery over night for IV antibiotics.  By the next  day, she was ambulating well and the wound was found to be clean, dry  and intact.  It was felt she could be discharged to home in stable  condition.   DISPOSITION:  To home.   DISCHARGE INSTRUCTIONS:  While she is at home, she will change her  dressings everyday and make sure these stay clean, dry and intact.  She  will try to limit her activities as much as she can, and I will see her  in the office in the next week.      Vanita Panda. Magnus Ivan, M.D.  Electronically Signed     CYB/MEDQ  D:  02/01/2007  T:  02/01/2007  Job:  [id number]

## 2010-12-28 NOTE — H&P (Signed)
[name], [name]              ACCOUNT NO.:  [id number]   MEDICAL RECORD NO.:  [id number]          PATIENT TYPE:  INP   LOCATION:  1831                         FACILITY:  [facility]   PHYSICIAN:  Hollice Espy, M.D.DATE OF BIRTH:  [birth year]   DATE OF ADMISSION:  11/03/2006  DATE OF DISCHARGE:                              HISTORY & PHYSICAL   PRIMARY CARE PHYSICIAN:  Al Decant. Janey Greaser, MD   CHIEF COMPLAINT:  Toe wound.   HISTORY OF PRESENT ILLNESS:  The patient is a 70 year old, white female  with past medical history of hypertension and diabetes mellitus with  secondary complications who has had an open wound on her right great toe  for an unknown length of time.  She does not do daily foot care.  She  noticed, however, about a week ago that she started having some redness  from her great toe that over the next several days started to spread to  involve a small portion of her foot.  She became concerned as to the  repeating erythema spreading and rather than keeping an appointment with  her podiatrist later on this week, she came into the emergency room.  In  the emergency room, she was found to have a white count of 10.6.  However, she also had an x-ray of her toe which showed eroding  osteomyelitis at the distal phalangeal tip.  With these findings, it was  thought the patient needed to come in for antibiotics and possible  surgical treatment.  Currently, the patient is doing well.  She denies  any headache, dysphagia, vision changes, palpitations, shortness of  breath, wheeze, cough, abdominal pain, hematuria, dysuria, constipation,  diarrhea, focal extremity numbness, weakness or pain.  Her review of  systems is otherwise negative.   PAST MEDICAL HISTORY:  1. Diabetes, secondary retinopathy and neuropathy.  2. History of hypertension.  She says her hypertension is well-      controlled.  3. History of atrial tachycardia.  4. History of vertigo.  5. History of herpes  simplex virus.   MEDICATIONS:  1. Benicar believed to be 20 mg p.o. daily.  2. Lisinopril believed to be 20 mg p.o. daily.  3. Metoprolol 75 mg p.o. believed to be daily.  4. Lantus 60 units subcu nightly.  5. Metformin 1000 mg p.o. b.i.d.   ALLERGIES:  MACROLIDES.   SOCIAL HISTORY:  She denies any current tobacco, alcohol or drug use.   FAMILY HISTORY:  Noncontributory.   PHYSICAL EXAMINATION:  VITAL SIGNS:  On admission, temperature 99.2,  heart rate 97, blood pressure 139/70, respirations 16, O2 saturations  98% on room air.  GENERAL:  The patient is alert and oriented x3 in no apparent distress.  HEENT:  Normocephalic, atraumatic.  Mucous membranes are moist.  She has  no carotid bruits.  HEART:  Regular rate and rhythm, S1, S2.  LUNGS:  Clear to auscultation bilaterally.  ABDOMEN:  Soft, nontender.  Obese, positive bowel sounds.  EXTREMITIES:  Her right great toe has a purulent draining wound about  the size of a quarter on the plantar surface of  the great toe.  She has  surrounding erythema and swelling that involves the portion of her foot  around the great toe.  The patient has neuropathy and no evidence of any  tenderness.  She has poor peripheral pulses.   LABORATORY DATA AND X-RAY FINDINGS:  White count 10.6, H&H 11.4 and  34.1, MCV 81, platelet count 334.  No shift.  I have ordered a BMET and  a hemoglobin A1c, both of which are pending.   ASSESSMENT/PLAN:  1. Osteomyelitis.  Will go ahead and check an MRI of the toe.  Despite      the x-ray findings, we will need to discuss with orthopedic surgery      about the possibility of amputation.  I have discussed this with      the patient and she is aware of this possibility.  In the meantime,      we will put her on intravenous vancomycin and Zosyn.  2. Diabetes mellitus.  Will continue her Lantus, decreased to 50 and      will make her compliant as far as diet and sliding scale goes.      Will also continue her  metformin.  3. Hypertension.  Continue metoprolol, Benicar and lisinopril.  Will      need to confirm with her primary care physician her medications.      Hollice Espy, M.D.  Electronically Signed     SKK/MEDQ  D:  11/03/2006  T:  11/03/2006  Job:  [id number]   cc:   Al Decant. Janey Greaser, MD

## 2010-12-28 NOTE — Discharge Summary (Signed)
[name], [name]              ACCOUNT NO.:  [id number]   MEDICAL RECORD NO.:  [id number]          PATIENT TYPE:  INP   LOCATION:  5727                         FACILITY:  [facility]   PHYSICIAN:  Kela Millin, M.D.DATE OF BIRTH:  1940-10-16   DATE OF ADMISSION:  11/03/2006  DATE OF DISCHARGE:  11/07/2006                               DISCHARGE SUMMARY   DISCHARGE DIAGNOSES:  1. Right foot cellulitis with right great toe distal signs of      osteomyelitis with purulent infection, status post right great toe      amputation through the proximal range of the phalanx.  2. Uncontrolled diabetes mellitus.  3. Hypertension.  4. History of atrial tachycardia.  5. History of vertigo.  6. History of herpes simplex virus.  7. History of diabetic retinopathy and neuropathy.   CONSULTATIONS:  Orthopedics, Vanita Panda. Magnus Ivan, M.D.   PROCEDURES AND STUDIES:  1. MRI of the right foot:  Findings consistent with osteomyelitis of      the distal pharyngeal bone of the great toe.  Nonspecific soft      tissue edema of the dorsum of the foot.  2. Status post right great toe amputation through the proximal range      of the phalanx per Dr. Magnus Ivan.   HISTORY:  The patient is a 70 year old white female with the above-  listed medical problems, who presented with complaints of worsening of  the wound on her right great toe.  It was reported that she had had an  open wound on that right great toe for an unknown length of time and she  admitted that she did not do daily foot care.  But that about a week  prior to presentation she started having some redness on that right  great toe and over the next several days it started to spread to involve  her forefoot.  She became concerned about the spreading erythema and so  came to the ER.  In the ER she had an x-ray of her foot, which eroding  osteomyelitis at the distal phalangeal tip.  She was admitted to the  hospital for IV antibiotics and  possible surgical treatment.   PHYSICAL EXAMINATION:  Her physical exam upon admission as per Dr.  Rito Ehrlich revealed a temperature of 99.2 with a pulse of 97, blood  pressure of 139/70, respiratory rate of 16, O2 saturation of 98% on room  air.   The pertinent findings on exam:  On her extremities, her right great toe  had a purulent drainage from the wound, which was about the size of a  quarter on the plantar surface of the great toe.  She also had  surrounding erythema and swelling that involved the portion of her foot  around the great toe.  No tenderness on palpation.  Diminished  peripheral pulses.   LABORATORY DATA:  Her white cell count 10.6 with a hemoglobin of 11.4,  hematocrit 34.1, MCV of 81, platelet count of 334.  Her sodium 134 with  a potassium of 3.9, chloride 100, CO2 of 25, blood glucose of 342, BUN  16, creatinine of 0.71.   HOSPITAL COURSE:  Problem 1.  RIGHT FOOT CELLULITIS WITH RIGHT GREAT TOE OSTEOMYELITIS:  Upon admission the patient was started on empiric broad-spectrum  antibiotics.  An MRI was done and the results are as stated above.  Orthopedics was consulted regarding possible amputation of that foot and  Dr. Magnus Ivan saw the patient.  On November 05, 2006, the patient had  amputation of that great toe per Dr. Magnus Ivan.  The patient tolerated  the procedure well.  The cultures were sent to lab.  The Gram smear  initially showed rare gram-positive cocci in pairs and in chains.  The  tissue cultures done did not show any organisms and no growth up to the  time of this dictation.  The patient has remained afebrile and  hemodynamically stable.  Dr. Magnus Ivan saw the patient in rounds today  and recommends that she be discharged home on oral antibiotics for one  more week.  He has also instructed the patient on dressing changes, and  she is to follow up with him in 2 weeks.   Problem 2.  UNCONTROLLED DIABETES MELLITUS:  The patient's blood sugars  were noted  to be elevated on admission.  The patient was placed on her  metformin as well as the Lantus.  While in the hospital, the Lantus dose  was increased for better blood glucose control.  She has been on Lantus  at 80 units q.h.s. along with her metformin, and she has been instructed  to monitor her blood sugars, keep a log, and take to her primary care  physician upon follow-up next week, and the dose of her medications to  be adjusted as appropriate for optimal blood glucose control.  I  discussed the importance of her blood glucose control and especially  emphasized the role that it has in wound healing, and she voiced  understanding.   Problem 3.  HYPERTENSION:  The patient was maintained on her  antihypertensives while the hospital, and she is to continue this upon  discharge.   DISCHARGE MEDICATIONS:  1. Lantus changed to 80 units subcu q.h.s.  2. Percocet one p.o. q.4-6h. p.r.n.  3. Augmentin 875 mg p.o. b.i.d. for 1 week.   The patient to continue her metformin, Benicar, metoprolol, lisinopril,  Caduet, aspirin as previously.   FOLLOW-UP CARE:  1. Dr. Magnus Ivan in 2 weeks.  Patient to call for appointment.  2. Dr. Janey Greaser in 1 week.  Patient to call for appointment.   SPECIAL INSTRUCTIONS:  Wound care as instructed per Dr. Magnus Ivan.   DISCHARGE CONDITION:  Improved/stable.      Kela Millin, M.D.  Electronically Signed     ACV/MEDQ  D:  11/07/2006  T:  11/07/2006  Job:  [id number]   cc:   Dr. Vivien Rota. Magnus Ivan, M.D.

## 2010-12-28 NOTE — H&P (Signed)
[name], [name]                        ACCOUNT NO.:  [id number]   MEDICAL RECORD NO.:  [id number]                   PATIENT TYPE:  INP   LOCATION:  2030                                 FACILITY:  [facility]   PHYSICIAN:  Lilyan Punt. Sydnee Levans, M.D.             DATE OF BIRTH:  Nov 10, 1940   DATE OF ADMISSION:  03/13/2003  DATE OF DISCHARGE:                                HISTORY & PHYSICAL   PRIMARY CARE PHYSICIAN:  Dr. Al Decant. Foreman.   HOSPITALISTS:  [facility].   CHIEF COMPLAINT:  Fever and chills.   HISTORY OF PRESENT ILLNESS:  [age]-year-old female with a history of  obesity, hypertension and vertigo, who experienced and abrupt onset of  chills and rigors midday today after she had been at church.  A nonsmoker,  she has been experiencing vertiginous symptoms for the past 7 to 10 days,  much worse than she can recollect.  There has been headache today but in  general, her activity levels have been such that she has been able to  ambulate and go about her usual duties.  Today, along with the chills and  rigors, there was no associated chest pain, sudden shortness of breath,  sudden cough or sputum production.  She appeared and felt that she was  panting heavily.  There has been some urinary frequency but no flank pains.  There is generalized weakness.  She relates a history of pneumonia in 1995  that presented and was similar to today.  Her chest x-ray in the ED reveals  no infiltrate or evidence of heart failure.  She denies any nausea, vomiting  or diarrhea, denies any macular rash nor any recent travel or insect or tick  bites.  Along with her headache today, there was abnormal vision, a tunnel  vision sensation.  She has diabetes and she takes this very infrequently;  the last time was about a week and a half ago and it was elevated at about  150.  Last month, she started Metformin, then had a glycosylated hemoglobin  of 11.6.  In addition, in the ED tonight,  there has been a fever of 104.7  with an abnormal urinalysis that is nitrite-positive.   PAST MEDICAL HISTORY:  1. Diabetes with complications of gastroparesis, having undergone an EKG in     March 2001; also, she has experienced diabetic peripheral neuropathy.  2. Vertigo history with a history of a CT in 2002 showing some     encephalomalacia and possible lacunar infarcts.  3. Obesity.  4. Hiatal hernia.  5. Hypertension.  6. Cholelithiasis with laparoscopic cholecystectomy two years ago.  7. Eye disease with glaucoma, cataracts and macular degeneration.  8. Appendectomy.  9. Tonsillectomy.  10.      Lumbar disk surgery.   MEDICATIONS:  1. Reglan 10 mg b.i.d.  2. Nexium 40 mg daily.  3. Celebrex 100 mg p.r.n.  4. Lipitor 20 mg  and this has recently been increased to 40 mg.  5. Benicar 20 mg.  6. Trusopt eye drops as well as one other type of eye drop which she cannot     recall.  7. Lisinopril -- dose is unknown.  8. Glucophage 1000 mg b.i.d.  9. Amaryl 4 mg she believes once daily.  10.      Tylenol PM in the evening for sleep.   SOCIAL HISTORY:  She is employed at [employer] as a Scientist, forensic.  She has  passive tobacco exposure and she, herself, quit in 12-27-1975.  Denies alcohol  use.  She is married and has a 37 year old daughter and two grandchildren  who live at home.   FAMILY HISTORY:  Her mother died on 2003-02-15 -- mother at 33 -- of heart  troubles and mild dementia.  Her father died at 18 years of age of sudden  heart-related death; that was in 12/26/1961.   REVIEW OF SYSTEMS:  ROS as above.   PHYSICAL EXAMINATION:  VITAL SIGNS:  Temperature is 104.7 initially, 90%  saturated on room air that improved with oxygen to 98% at 2 L, blood  pressure stable at 151/60, pulse rate 129 presently, but having as high as  150 to 160.  GENERAL:  This is a pleasant white female who appears mildly ill and  diaphoretic.  HEENT:  Oral membranes are slightly dry.  Nares clear.  Eyes  are  unremarkable.  Ears reveal no sign of infection.  NECK:  The neck is supple.  There is full range of motion.  There is no  rigidity.  LUNGS:  Lungs clear throughout.  No wheeze, rhonchi or rales.  CARDIAC:  Tachycardic.  No murmur, rub or gallop.  ABDOMEN:  Abdomen is soft, nontender, nondistended but is obese.  There are  a few healed scars.  EXTREMITIES:  Extremities are warm without cyanosis, clubbing or edema.  There is diminished sensory sense about both feet.  No ulcers or lesions.  Pulses in the feet, groin and wrist areas are intact.  NEUROLOGIC:  A nonfocal bedside exam without gait testing.  Cranial nerves  II-XII intact.   STUDIES:  Chest x-ray reveals no cardiomegaly, pulmonary infiltrates or  edema.   CBC shows a white count of 10.5 with 85% PMNs, a hemoglobin of 11.5 and  platelet count of 190,000.  BMET shows a normal sodium of 135, potassium  4.3, glucose 336, BUN 22 and creatinine that is not reported.  Urinalysis:  Specific gravity 1.019, greater than 1000 glucose, ketones positive, total  protein positive, nitrite positive, urine leukocyte esterase positive with a  microscopic revealing 11 to 20 wbc's per high-power field and many bacteria.  Cardiac markers:  P=C, myoglobin elevated at 173, MB 3.4, range of 1.0 to  5.9, troponin I range 0.02 to 0.5 and is elevated at 0.57.  Standard  troponin markers are being repeated.   EKG at 1841 revealed sinus tachycardia with a pulse rate of 151 and ST  segment depression leads II, III and F.  A repeat ECG at 2245 reveals  nonspecific T wave changes, pulse rate of 129.   ASSESSMENT AND PLAN:  1. Constellation of fevers, rigors, pyuria and nitrite-positive urine.  For     this she will be admitted, given empiric antibiotics, intravenous     quinolone, Cipro 750 mg q.12h. to start, while working up blood and urine     cultures further. 2. Uncontrolled diabetes.  She  sounds noncompliant with her therapies at     home.   Given her sliding-scale insulin, she will need tightening of her     outpatient medication regimen and we will need to check with her primary     doctors whether a Pneumovax was given.  3. Vertigo/visual changes.  For this, we will admit her to a telemetry bed.     We will recheck her cardiac enzymes and also check head CT to compare     with her old film, given her week-long vertigo process.  4. She will need further followup on blood pressures, cardiac enzymes,     hemoglobin and we will need to consider     cardiac evaluation, should the cardiac workup remain abnormal.  We     admitted her to a telemetry setting so that she may be observed closely,     given Lovenox prophylactically and have added aspirin and low-dose beta     blocker.                                                Lilyan Punt Sydnee Levans, M.D.    KCS/MEDQ  D:  03/13/2003  T:  03/14/2003  Job:  [id number]   cc:   Al Decant. Janey Greaser, MD  [address]  [place]  [place] [address]  Fax: [phone]

## 2010-12-28 NOTE — Procedures (Signed)
[place]  Patient:    [name], [name]                     MRN: [id number] Proc. Date: 10/19/99 Adm. Date:  [id number] Disc. Date: [id number] Attending:  Mingo Amber CC:         Lorne Skeens. Hoxworth, M.D.             Al Decant. Janey Greaser, M.D.                           Procedure Report  PROCEDURES:  Video upper endoscopy and screening sigmoidoscopy.  ENDOSCOPIST:  Roosvelt Harps, M.D.  INDICATIONS:  A 70 year old female complaining of dysphagia, sore throat, hoarse voice, belching, and reflux despite treatment with Protonix and then Prilosec. She is a type 2 diabetic.  On report, she has a hiatal hernia on an upper GI series and the suggestion of a Schatzkis ring.  I have not been able to locate these films.  PREPARATION:  She is n.p.o. since midnight.  PREPROCEDURE SEDATION:  She received a total of 60 mg of Demerol and 6 mg of Versed intravenously.  In addition, her throat was anesthetized with Hurricaine spray, and she was on 2 L of nasal cannula O2.  DESCRIPTION OF PROCEDURE:  The Olympus video upper endoscope was inserted via the mouth and through the upper esophageal sphincter with ease.  Intubation was then carried out well into the descending duodenum.  On withdrawal, the mucosa was carefully evaluated.  Descending duodenum and bulb appeared normal, as did the antrum of the stomach.  The fundus and body of the stomach were largely filled ith a significant gastric bezoar.  Retroflexed view of the GE junction did not demonstrate any significant hiatal hernia.  On withdrawal through this area, the Z-line occurred at 40 cm, and again, I could appreciate no hiatal hernia nor was there any obvious Schatzkis ring or obstruction to passage of the endoscope. No resistance could be felt whatsoever.  There were no signs of esophagitis throughout the length of the esophagus.  The patient tolerated this procedure well.  At the  conclusion, her position was reversed for procedure #2, video sigmoidoscopy for screening purposes.  No further sedation was administered.  The endoscope was inserted via the rectum and advanced very easily all the way to 65 cm.  On withdrawal, the mucosa was carefully evaluated and found to be entirely normal rom 65 cm to retroflexed view of the rectum.  There were no areas of neoplasia, inflammation, diverticulosis, or significant hemorrhoids.  The patient tolerated both procedures well.  Pulse, blood pressure, and oximetry testing were stable throughout.  She was observed in recovery for 45 minutes and discharged home alert with a benign abdomen.  IMPRESSION: 1. Normal screening sigmoidoscopy. 2. Upper endoscopy which does not correspond to the reported x-rays.  The patient    has a gastric bezoar, perhaps this residual food material in the distal    esophagus may have confused the picture.  I suspect she has gastroparesis    related to her diabetes.  I do not see anything suggestive of significant    stricturing that requires dilation, nor do I see any signs of obvious    esophagitis.  PLAN:  The patient is to continue proton pump inhibitor, and I am adding Reglan  10 mg before the largest meal of the day and at bed time.  I would ask her to return to the office in three weeks for followup.  Again, I will attempt to retrieve her x-rays to review. DD:  10/19/99 TD:  10/21/99 Job: [id number]

## 2010-12-28 NOTE — Op Note (Signed)
[place]. [facility]  Patient:    NOELI, LAVERY                     MRN: [id number] Proc. Date: 12/10/99 Adm. Date:  [id number] Attending:  Glenna Fellows T[place]an                           Operative Report  PREOPERATIVE DIAGNOSIS:  Symptomatic cholelithiasis.  POSTOPERATIVE DIAGNOSIS:  Symptomatic cholelithiasis.  PROCEDURE:  Laparoscopic cholecystectomy with intraoperative cholangiograms.  SURGEON:  Lorne Skeens. Hoxworth, M.D.  ASSISTANT:  Milus Mallick, M.D.  ANESTHESIA:  General.  PREOPERATIVE BRIEF HISTORY:  Ms. Kimble is a 70 year old white female who has a history of worsening abdominal complaints including upper abdominal pressure, pain, bloating, and nausea.  She has had an extensive workup including abdominal ultrasound, CT scan of the abdomen, and upper GI endoscopy.  She has been treated for reflux disease with some slight improvement but continues to have upper abdominal discomfort and tenderness and nausea, and CT scan has confirmed gallstones which were questionable on ultrasound.  Laparoscopic cholecystectomy has been recommended.  PREOPERATIVE PROCEDURES:  Indications, risks of bleeding, infection, bile lake, and bowel loop injury have been discussed and understood.  She is now brought to the operating room for this procedure.  DESCRIPTION OF PROCEDURE:  The patient is brought to the operating room and placed in the supine position on the operating table and general endotracheal anesthesia is induced.  Antibiotics have been given intravenously.  PFs were in place.  The abdomen was sterilely prepped and draped.  Local anesthesia was used to infiltrate the trocar sites prior to the incisions.  A 1-cm incision was made in the umbilicus and the dissection carried down to the midline fascia.  This was sharply incised for 1 cm.  The manner was under direct vision.  Through a mattress suture of #0 Vicryl, the ______ trocar  was inserted and pneumoperitoneum was established.  Under direct vision, a 10-mm trocar was placed in the subxiphoid area and two 5-mm trocars along the right subcostal margin.  The gallbladder was identified and the fundus grasped and elevated up over the liver.  The infundibula was retracted inferolaterally and fibrofatty tissue was stripped off the neck of the gallbladder toward the porta hepatis.  Exposure was slightly difficult due to the weight of the liver and the patients size, but adequate exposure was obtained, and the cystic duct was dissected free and its junction with the gallbladder thoroughly dissected.  Following this, operative cholangiograms were obtained through the cystic duct which showed good filling of a normal sized common bile duct and intrahepatic ducts with free flow into the duodenum and no filling defects. Following this, the clamps, catheter was removed, and the cystic duct was doubly clipped proximally and divided.  The cystic artery was identified next to the gallbladder wall enclosed triangle and was divided between the clips. The gallbladder was then dissected free from its bed using hook and fascial cautery and removed through the umbilicus.  Completed hemostasis was assured in the operative site, and the right upper quadrant was irrigated and suctioned until clear.  Trocars removed under direct vision.  The pursestring suture was secured at the umbilicus.  All CO2 was evacuated from the peritoneal cavity.  The skin incisions were closed with a subcuticular 4-0 Monocryl and Steri-Strips.  Sponge, needle, and instrument counts were correct.  Dry sterile dressings were applied  and the pt taken to recovery in good condition. DD:  12/10/99 TD:  12/10/99 Job: [id number]

## 2010-12-28 NOTE — Op Note (Signed)
[name], [name]              ACCOUNT NO.:  [id number]   MEDICAL RECORD NO.:  [id number]          PATIENT TYPE:  AMB   LOCATION:  SDS                          FACILITY:  [facility]   PHYSICIAN:  [name], M.D.DATE OF BIRTH:  03-Nov-1940   DATE OF PROCEDURE:  12/12/2006  DATE OF DISCHARGE:                               OPERATIVE REPORT   PREOPERATIVE DIAGNOSIS:  Right great toe infection, status post  amputation.   POSTOPERATIVE DIAGNOSIS:  Right great toe infection, status post  amputation.   PROCEDURE:  1. Irrigation and debridement with revision amputation of right great      toe.  2. Unroofing of eschar, right lateral foot.   SURGEON:  [name] Panda. Magnus Ivan, MD   ANESTHESIA:  Ankle block with IV sedation.   BLOOD LOSS:  Minimal.   COMPLICATIONS:  None.   INDICATIONS:  Briefly, [name] is a 70 year old diabetic female who  over a month ago I performed a right great toe amputation secondary to  gross purulence at the end of the toe; I just took it down to the  proximal phalanx.  I then follow her closely in office and her erythema  improve greatly over her entire foot.  Her blood glucose level normalize  and she had been doing quite well, except for a small area that opened  up on my incision and it started to drain.  The rest of the incisions  healed in its entirety, but she kept having persistent drainage from the  end of the toe, so I recommended she undergo revision amputation with  assessment of doing a 1st ray resection versus just cleaning the end of  the bone and the tissue; she agreed to proceed with surgery, given the  potential for continued infection.   PROCEDURE:  After informed consent was obtained, the appropriate right  ankle and foot were marked and an ankle block was obtained by  Anesthesia.  She was then brought to the operating room and placed  supine on the operating table.  IV sedation was obtained.  Her foot was  prepped with  Betadine scrub and paint.  I then went through my previous  incision in the toe and found a small touch of necrotic tissue just at  the tip and some purulence in this area, but the remainder of the tissue  looked viable.  I did use a rongeur to cut back the bone further and got  good bleeding bone and good bleeding surrounding tissue.  I shortened up  the skin and then irrigated copiously the tissue with 500 mL of  bacitracin solution followed by normal saline solution.  On the lateral  aspect of her foot, I used a 15 blade to unroof an eschar and got good  bleeding tissue of this on the lateral side of her foot near her  proximal phalanx of her 5th toe and this was good bleeding without full-  thickness or other exposed tissue.  I then irrigated all these tissues  thoroughly again, closed the great toe incision with interrupted 2-0  nylon suture.  Xeroform was placed  over the eschar as well as the toe  incision, followed by dressing sponges, soft Kling and Coban.  She was  taken to the recovery room in stable condition.  There were no  complications.  Postoperatively, I will leave her in this dressing and  have her ambulate in a postoperative shoe.  She can stop her antibiotics  at this point and I will see her in the office in the next 2-3 days for  a wound check and dressing change.      [name], M.D.  Electronically Signed     CYB/MEDQ  D:  12/12/2006  T:  12/13/2006  Job:  [id number]

## 2010-12-28 NOTE — Op Note (Signed)
[name], [name]              ACCOUNT NO.:  [id number]   MEDICAL RECORD NO.:  [id number]          PATIENT TYPE:  INP   LOCATION:  5727                         FACILITY:  [facility]   PHYSICIAN:  Vanita Panda. Magnus Ivan, M.D.DATE OF BIRTH:  March 09, 1941   DATE OF PROCEDURE:  11/05/2006  DATE OF DISCHARGE:                               OPERATIVE REPORT   PREOPERATIVE DIAGNOSIS:  Right great toe distal phalanx osteomyelitis  and noted purulent infection.   POSTOPERATIVE DIAGNOSIS:  Right great toe distal phalanx osteomyelitis  and noted purulent infection.   PROCEDURE:  Right great toe amputation through the proximal range of  phalanx.   SURGEON:  Doneen Poisson, M.D.   ANESTHESIA:  1. Right foot and ankle block.  2. IV sedation.   FINDINGS:  Grossly purulent material and necrotic bone, right great toe  distal phalanx.  Pathology and cultures sent and pending.   ESTIMATED BLOOD LOSS:  Minimal.   TOURNIQUET TIME:  Tourniquet local ankle, Esmarch for 10 minutes.   COMPLICATIONS:  None.   INDICATIONS:  Briefly, [name] is a 70 year old diabetic who, about  a week ago, developed a wound on the plantar aspect of her great toe  with drainage from this area.  She came to the emergency room, was  admitted to the medicine service for diabetes control.  Our service was  consulted and an MRI was obtained.  Plain films showed destruction of  the great toe distal phalanx and MRI confirmed this.  The MRI was  obtained by the general medicine service because of diffuse cellulitis  on the foot in general.  The remainder of the MRI showed the cellulitis  but not above the foot with no further evidence of bony destruction in  the foot.  It was recommended she undergo great toe amputation secondary  to the gross purulence and the now infected bone.  The risks and  benefits of this were explained to her and well understood.  She agreed  to proceed with surgery.   DESCRIPTION OF  PROCEDURE:  After informed consent was obtained and the  appropriate right foot was marked, a block was obtained by anesthesia.  She was then brought to the operating room and placed supine on the  operating table.  IV sedation was obtained.  Time-out was called to  identify the correct patient and the correct extremity.  The foot was  prepped and draped with Betadine scrub and paint.  I used an Esmarch to  wrap up the foot and used this as a local tourniquet.  I then made an  incision around the great toe and removed the great toe in its entirety  through the great toe IP joint.  I then used a bone cutting forceps to  cut back into the proximal phalanx and where I got nice normal appearing  bone.  I did clean this tissue and used bacitracin solution to irrigate  through the great toe.  I then removed the ankle Esmarch and there was  brisk bleeding noted.  Hemostasis was obtained.  I then  closed the tissue, then reapproximated the  skin with interrupted 2-0  nylon sutures.  Xeroform followed by well-padded sterile dressing were  applied.  The patient was stable throughout the case and there no  complications noted.  Final counts were correct.  She was taken awake  and alert to the recovery room in stable condition.      Vanita Panda. Magnus Ivan, M.D.  Electronically Signed     CYB/MEDQ  D:  11/05/2006  T:  11/05/2006  Job:  [id number]

## 2010-12-28 NOTE — Consult Note (Signed)
NAMESHENANDOAH, YEATS                        ACCOUNT NO.:  [id number]   MEDICAL RECORD NO.:  [id number]                   PATIENT TYPE:  INP   LOCATION:  2030                                 FACILITY:  [facility]   PHYSICIAN:  Armanda Magic, M.D.                  DATE OF BIRTH:  08-Jun-1941   DATE OF CONSULTATION:  03/14/2003  DATE OF DISCHARGE:                                   CONSULTATION   PRIMARY CARE PHYSICIAN:  Dr. Janey Greaser   CHIEF COMPLAINT:  Tachycardia and nonspecific ST wave changes on EKG with  slightly positive troponin level.   HISTORY OF PRESENT ILLNESS:  The patient is a very pleasant 70 year old  female with no prior cardiac history who was admitted to [facility]  on 03/13/2003, diagnosed with UTI and pyuria.  She was found to be in SVT at  150 with ST segment depression inferolaterally, questionably rate related  because the ST changes improved as the heart rate decreased.  The cardiac  enzymes thus far have been negative except for a borderline positive  troponin.  CPK-MBs are normal.  She has not had any chest pain or chest  pressure, no shortness of breath, PND, orthopnea, or lower extremity edema.  However, she does state that occasionally she has had palpitations in the  past only lasting for a transient period of time.   Cardiac risk factors include obesity, uncontrolled diabetes mellitus,  hypertension, hyperlipidemia, and a previous family history of coronary  disease with her father dying at 20 of an MI.  She also has a brother in his  60s who had bypass surgery.   PAST MEDICAL HISTORY:  1. Diabetes mellitus.  2. Gastroparesis.  3. Obesity.  4. Hypertension.  5. Hyperlipidemia.  6. History of vertigo.  7. History of hiatal hernia.   Of note, her diabetes mellitus is poorly controlled with a hemoglobin A1C  recently of 11.   PAST SURGICAL HISTORY:  1. Cholecystectomy.  2. Appendectomy.  3. Tonsillectomy.  4. Lumbar disk surgery.  5.  Glaucoma and cataracts.   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  She is  married.  She denies tobacco or alcohol use.  She did smoke but quit in  1977.   FAMILY HISTORY:  Her father died of MI at 30.  Her mother died of dementia  at 58.    MEDICATIONS:  1. Lipitor.  2. Benicar 20 mg daily.  3. Lisinopril.  4. Glucophage 1000 mg b.i.d.  5. Amaryl 4 mg a day.  6. Reglan b.i.d.  7. Nexium 40 mg a day.  8. Celebrex 200 mg daily. p.r.n.  9. Trusopt eye drops.   ALLERGIES:  BIAXIN and FLUROXENE EYEDROP DYE.   REVIEW OF SYSTEMS:  Other than as stated in HPI includes rigors and chills  recently in the setting of UTI as well as  vertigo.  Otherwise no other  Review of Systems positive.   PHYSICAL EXAMINATION:  VITAL SIGNS:  Temperature 100.9, blood pressure  126/66, pulse 115 to 150 but regular.  Respirations 20, O2 saturation 92% on  room air.  GENERAL:  Alert and oriented white female in no acute distress, obese.  HEENT:  Pupils are equal, round, and reactive to light and accommodation.  Extraocular muscles intact bilaterally.  NECK:  No bruits.  LUNGS:  Clear to auscultation throughout.  HEART: Regular rate and rhythm but tachycardia.  No murmurs, rubs, or  gallops.  Normal S1 and S2.  ABDOMEN:  Soft, nontender, nondistended, with active bowel sounds.  NO  hepatosplenomegaly.  EXTREMITIES:  No cyanosis, erythema, or edema.  Good distal pulses, +2  dorsalis pedis pulses.  NEUROLOGIC:  Nonfocal.  Mentation intact.   LABORATORY DATA:  White cell count 12, hematocrit 29.3, hemoglobin 10,  platelet count 181.  Sodium 138, potassium 3.8, chloride 106, bicarb 25, BUN  23, creatinine 1, glucose 264.  CPKs 58 and 72.  MBs 3.9 and 4.  Troponin  1.08 and 1.06.  Myoglobin 196.   EKG shows narrow complex tachycardia intermittently with an incomplete right  bundle branch block.  There is nonspecific ST-T wave abnormality in inferior  lateral leads which could  represent underlying ischemic or just rate-  dependent ST changes.   ASSESSMENT:  1. Urinary tract infection with fever, chills, rigors, and pyuria.  On IV     Cipro. Blood cultures and urine cultures are pending.  2. Tachycardia which is narrow complex, questionable sinus tachycardia from     fever and infection versus atrial flutter with 2:1 block.  3. Mild increase in troponin.  MB and CPKs are normal.  Questionably     secondary to tachycardia-induced enzyme release versus ischemia.  4. Vertigo.  5. Diabetes mellitus which is very uncontrolled with hemoglobin A1C of 11.5.  6. Hypertension which is stable.   PLAN:  1. Agree with initiation of beta blocker therapy for elevated heart rate.  2. Would add aspirin.  3. Check lipid panel and TSH.  4. A 2-D echocardiogram to asses left ventricular function once heart rate     decreases.  5. Will need noninvasive testing with adenosine Cardiolite prior to     discharge.  6. Adenocard IV now 6 mg to assess if patient's arrhythmia is sinus     tachycardia versus atrial flutter.  If atrial flutter is present, would     start IV Cardizem and heparin.                                               Armanda Magic, M.D.    TT/MEDQ  D:  03/14/2003  T:  03/14/2003  Job:  [id number]   cc:   Althea Grimmer. Luther Parody, M.D.  [address], [address]  [place]  [place] [address]  Fax: [phone]   Dr. Janey Greaser

## 2010-12-28 NOTE — Discharge Summary (Signed)
NAMECHASTY, RANDAL                        ACCOUNT NO.:  [id number]   MEDICAL RECORD NO.:  [id number]                   PATIENT TYPE:  INP   LOCATION:  2030                                 FACILITY:  [facility]   PHYSICIAN:  Jackie Plum, M.D.             DATE OF BIRTH:  [birth year]   DATE OF ADMISSION:  03/13/2003  DATE OF DISCHARGE:  03/18/2003                                 DISCHARGE SUMMARY   PRIMARY CARE PHYSICIAN:  Al Decant. Janey Greaser, M.D. of [facility].   CARDIOLOGIST:  Armanda Magic, M.D.   DISCHARGE DIAGNOSES:  1. Tachycardia unrelated to myocardial ischemia as determined by Cardiolite     stress test, resolved.  2. Sepsis with an urinary source, resolved.  3. 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  4. Type 2 diabetes, poorly controlled.  5. History of vertigo, stable.  6. Obesity.  7. Hiatal hernia.  8. Hypertension, stable.  9. History of cholelithiasis, status post laparoscopic cholecystectomy two     years ago.  10.      Eye disease with glaucoma, cataracts, and macular degeneration.  11.      Hyperlipidemia, stable.  12.      Abnormal TSH results of 0.011, requires followup.  T4 is 8.9, T3 is     3.3.   DISCHARGE MEDICATIONS:  1. Glucophage 1000 mg p.o. b.i.d. to start on March 21, 2003.  2. Amaryl 8 mg p.o. daily.  3. Reglan 10 mg b.i.d.  4. Nexium 40 mg daily.  5. Celebrex 100 mg daily p.r.n.  6. Lipitor 40 mg daily.  7. Benicar 20 mg daily.  8. Eye drops as prescribed by ophthalmologist.  9. Lisinopril 20 mg daily.  10.      Coated aspirin 325 mg daily.  11.      Lopressor 75 mg b.i.d.  12.      Cipro 500 mg b.i.d. x5 days.  13.      Glucotrol XL 5 mg daily.  14.      Penciclovir cream 1% to lip lesions q.4h. while awake.   ALLERGIES:  BIAXIN.   PROCEDURES:  Cardiolite stress test performed on August 5 and March 18, 2003.  Initial results reveal no significant areas of ischemia.  Anterior  wall motion is normal on dated  studies.  The patient tolerated the study  without difficulty and had no complications.   HISTORY OF PRESENT ILLNESS:  This is a very pleasant 70 year old female with  a history of obesity, hypertension, and vertigo, who experienced an abrupt  onset of chills and rigors mid-day on the day of presentation.  A nonsmoker,  she has been experiencing vertiginous symptoms for the past 7 to 10 days,  much worse then she can recollect.  There has been headache today, but in  general, activity levels have been such that she has been able to ambulate  and go about her  usual duties.  Today along with chills and rigors, there is  no associated chest pain, shortness of breath, sudden cough or sputum  production.  She appeared and felt that she was panting heavily.  There has  been some urinary frequency, but no flank pain.  There is generalized  weakness.  She relates a history of pneumonia in 1995, that presented  similar to her symptoms of today.  Her chest x-ray in the emergency  department reveals no infiltrate or evidence of heart failure.  She denies  any nausea, vomiting, diarrhea.  Denies any macular rash or any recent  travel, insect or tick bites.  Along with her headache today, there was  abnormal vision being a tunnel vision sensation.  She has diabetes and  checks her blood sugar quite infrequently, the last time was about 1-1/2  weeks ago, and it was elevated at about 150.  Last month she started  Glucophage, then had a hemoglobin A1c of 11.6.  In addition, in the  emergency department on another presentation there has been fever of 104.7  with an abnormal urinalysis that is nitrite positive.  The patient is  admitted for further evaluation and treatment.   HOSPITAL COURSE:  The patient was initially admitted to telemetry unit due  to being in PSVT while in the emergency department.  She did receive  Adenosine with good results.  Max rate while in PSVT was 151.  She converted  to sinus  rhythm after being given Adenocard.  Her heart rate did creep back  up into the 100 to 115 range over the next 48 to 72 hours during her  admission.  Dr. Armanda Magic was consulted, and she recommended adding beta  blocker to the patient's antihypertensive regimen.  Several doses of beta  blocker were tried.  The patient is currently on 75 mg b.i.d. which seems to  be controlling her rate in the 80s quite nicely.   The patient underwent 2-D echocardiogram which estimated her RV systolic  pressure at 35 mmHg.  The study found that left ventricular systolic  function was mild to moderately decreased with an ejection fraction of 40 to  45%.  This study was inadequate for evaluation of left ventricular wall  motion.  Left ventricular wall thickness was mildly increased.  There was  mild flattening of the intraventricular septum during diastole and right  ventricle was mildly dilated.  The patient then underwent stress Cardiolite  testing on the day prior to and of discharge administered by Dr. Mayford Knife.  Dr. Mayford Knife saw the patient on day of discharge and reported that the initial  results of the Cardiolite did not indicate any worrisome ischemia, and that  the patient was appropriate for discharge.  She can follow up the remaining  Cardiolite results with her primary M.D.   The patient is a known type 2 diabetic and was recently started on  Glucophage.  Because of the likelihood of the patient having to undergo some  type of dye study during this admission, her Glucophage was held on  admission and throughout her hospital stay.  The patient is instructed to  restart her Glucophage on March 21, 2003.  Blood sugars consistently ran in  the high 200 to low 300 range during this admission.  She was treated with  sliding scale insulin as well as Lantus insulin at night-time with good  results.  On March 16, 2003, the patient did have to be placed on Glucomander for hyperglycemia  which brought her  blood sugar down to  approximately 115.  Blood sugar at time of discharge is 285, however, the  patient did not receive her Lantus insulin on the night prior to discharge.  The patient reported on admission that her hemoglobin A1c most recently was  11.6.  This along with consistently elevated glucose readings in the  hospital indicates poorly controlled type 2 diabetes.  As noted, Glucotrol  was added to the patient's medication regimen.  In addition, her Amaryl  dosage was doubled from 4 to 8 mg.  She will require close followup of her  diabetes after discharge.  The patient is instructed to check her blood  sugars first thing in the morning and just before bedtime every day, to  record these readings, and to take them with her to all visits to her  primary M.D.  An order form is filled out for a home glucose monitor in the  event that the patient does not own one of her own.   As noted in the HPI, the patient was found to have an urinary tract  infection on admission and thought to be in early sepsis due to a very high  fever, and systemic signs and symptoms of sepsis, including tachycardia,  vertigo, hypothermia, and hyperglycemia.  She was treated with IV Cipro and  transferred to p.o. antibiotics 24 hours prior to discharge, and she remains  afebrile at time of discharge.  She is asymptomatic for UTI symptoms.  Urine  culture did grow Klebsiella pneumoniae, which is known to be sensitive to  Cipro.  She will continue her antibiotics for a total of 10 days.  The  patient did have positive blood cultures for Klebsiella pneumoniae which is  highly sensitive to Cipro.   On the day prior to discharge, the patient was noted to have multiple herpes  simplex I lesions to the right side of her lower lip.  These were initially  treated with p.o. acyclovir, and then the patient was changed over to  penciclovir cream which she is to continue until the lesions are gone.   On this admission,  the patient had an abnormally low TSH of 0.011, however,  T3 and T4 levels were normal.  At time of discharge, it is thought that the  patient's low TSH may have been the result of acute physiologic stress.  It  is recommended that she have a TSH repeated in two to three weeks time for  more accurate evaluation of her thyroid function.   DISCHARGE LABORATORY DATA:  White blood cell count 8.3, hemoglobin 10,  hematocrit 29.5, platelet count 250,000.  Repeat UA was negative for  infection, ketones, glucose.  T4 8.9, T3 3.3.  Total cholesterol 111,  triglycerides 181, HDL 24, LDL 51.  Sodium 133, potassium 3.5, glucose 372,  BUN 21, creatinine 1.1, BNP 709.  TSH 0.011.  Cardiac enzymes were negative.  Troponin was mildly elevated which was thought to be secondary to the  patient's tachycardia rather then to ischemia.   CONSULTATIONS:  Armanda Magic, M.D. of [facility].   CONDITION ON DISCHARGE:  Good.  DISPOSITION:  Discharged to home.   FOLLOWUP:  The patient is to follow up with her primary M.D. next week for  monitoring of particularly her blood sugar and her blood pressure and to  receive the remainder of the results from her Cardiolite stress test.  The  patient will also need TSH re-checked in three to six weeks.  Ellender Hose. Christian Mate, M.D.    SMD/MEDQ  D:  03/18/2003  T:  03/19/2003  Job:  [id number]   cc:   Armanda Magic, M.D.  [address], [address]  [place], [place] [address]  Fax: [phone]   Al Decant. Janey Greaser, MD  [address]  [place]  [place] [address]  Fax: [phone]

## 2011-04-01 ENCOUNTER — Other Ambulatory Visit: Payer: Self-pay | Admitting: Urology

## 2011-04-01 ENCOUNTER — Ambulatory Visit (HOSPITAL_BASED_OUTPATIENT_CLINIC_OR_DEPARTMENT_OTHER)
Admission: RE | Admit: 2011-04-01 | Discharge: 2011-04-01 | Disposition: A | Discharge status: Home / Self Care | Payer: Medicare Other | Source: Ambulatory Visit | Attending: Urology | Admitting: Urology

## 2011-04-01 DIAGNOSIS — E109 Type 1 diabetes mellitus without complications: Secondary | ICD-10-CM | POA: Insufficient documentation

## 2011-04-01 DIAGNOSIS — Z01812 Encounter for preprocedural laboratory examination: Secondary | ICD-10-CM | POA: Insufficient documentation

## 2011-04-01 DIAGNOSIS — I739 Peripheral vascular disease, unspecified: Secondary | ICD-10-CM | POA: Insufficient documentation

## 2011-04-01 DIAGNOSIS — G609 Hereditary and idiopathic neuropathy, unspecified: Secondary | ICD-10-CM | POA: Insufficient documentation

## 2011-04-01 DIAGNOSIS — E669 Obesity, unspecified: Secondary | ICD-10-CM | POA: Insufficient documentation

## 2011-04-01 DIAGNOSIS — I10 Essential (primary) hypertension: Secondary | ICD-10-CM | POA: Insufficient documentation

## 2011-04-01 DIAGNOSIS — D090 Carcinoma in situ of bladder: Secondary | ICD-10-CM | POA: Insufficient documentation

## 2011-04-01 LAB — GLUCOSE, CAPILLARY: Glucose-Capillary: 153 mg/dL — ABNORMAL HIGH (ref 70–99)

## 2011-04-01 LAB — POCT I-STAT 4, (NA,K, GLUC, HGB,HCT)
Glucose, Bld: 110 mg/dL — ABNORMAL HIGH (ref 70–99)
HCT: 36 % (ref 36.0–46.0)
Hemoglobin: 12.2 g/dL (ref 12.0–15.0)
Potassium: 3.8 mEq/L (ref 3.5–5.1)
Sodium: 141 mEq/L (ref 135–145)

## 2011-04-04 NOTE — Op Note (Signed)
[name], MACKNIGHT              ACCOUNT NO.:  [id number]  MEDICAL RECORD NO.:  [id number]  LOCATION:  [facility]                         FACILITY:  [facility]  PHYSICIAN:  Carmin Dibartolo C. Vernie Ammons, M.D.  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  PROCEDURE:  Cystoscopy with bladder biopsies and barbotage of the bladder.  SURGEON:  Brycelynn Stampley C. Vernie Ammons, MD  ANESTHESIA:  General.  SPECIMENS COLD CUP BIOPSIES: 1. Right wall. 2. Left wall. 3. Posterior wall. 4. Dome. 5. Floor and barbotage specimen.  All sent to Pathology.  DRAINS:  None.  BLOOD LOSS:  Minimal.  COMPLICATIONS:  None.  INDICATIONS:  The patient is a 70 year old female who was found to have erythematous patches within the bladder at various locations which were biopsied and revealed CIS.  She also had intravesical BCG induction therapy and returns to the operating room today for biopsy of the bladder after completing a 6-week course of weekly induction BCG therapy.  The procedure, its risks, and complications as well as alternatives were discussed.  The patient understands and has elected to proceed.  DESCRIPTION OF OPERATION:  After informed consent, the patient was brought to the major OR, placed on table, administered general anesthesia, and then moved to the dorsal lithotomy position.  Her genitalia was sterilely prepped and draped and an official time-out was then performed.  Cystoscopy was initially performed by introducing a 22-French cystoscope with 12-degree lens into the bladder.  I then drained the bladder and inspected the bladder with saline irrigation and noted what appeared to be an erythematous  patch in the dome region as well as some possible irregularity of the mucosa on the right and left walls as well as floor of the bladder.  These were noted and then I proceeded with barbotage of the bladder.  I filled the bladder with saline and used a Toomey syringe to obtain a barbotage specimen which was sent for cytology.  I then proceeded to obtain cold cup biopsies from the above-noted locations, but included the areas that appeared slightly suspicious.  After completing this, I fulgurated each of these and noted no evidence of bladder perforation.  The area near the dome which appeared to be the most suspicious was fulgurated in its entirety as well.  I inspected each of the biopsy sites and no bleeding was noted and therefore the bladder was drained and the patient was awakened and taken to recovery room in stable and satisfactory condition.  She tolerated procedure well and there were no intraoperative complications.  She will be given a prescription for Pyridium #20 and Vicodin #80 and follow up in my office in 1 week to go over the results of the pathology report.     Larone Kliethermes C. Vernie Ammons, M.D.     MCO/MEDQ  D:  04/01/2011  T:  04/02/2011  Job:  [id number]  Electronically Signed by Loraine Leriche  Ahmeer Tuman M.D. on 04/04/2011 09:12:15 PM

## 2011-05-23 LAB — CBC
HCT: 28.3 — ABNORMAL LOW
Hemoglobin: 9.3 — ABNORMAL LOW
MCHC: 33
MCV: 77.8 — ABNORMAL LOW
MCV: 78
Platelets: 424 — ABNORMAL HIGH
Platelets: 471 — ABNORMAL HIGH
Platelets: 572 — ABNORMAL HIGH
RBC: 3.12 — ABNORMAL LOW
RBC: 3.63 — ABNORMAL LOW
RBC: 3.71 — ABNORMAL LOW
RDW: 20.4 — ABNORMAL HIGH
WBC: 11.3 — ABNORMAL HIGH
WBC: 13.1 — ABNORMAL HIGH
WBC: 9.7

## 2011-05-23 LAB — BASIC METABOLIC PANEL
BUN: 22
BUN: 23
BUN: 23
CO2: 27
Calcium: 10.1
Calcium: 9.7
Chloride: 102
Chloride: 104
Creatinine, Ser: 1.09
Creatinine, Ser: 1.13
Creatinine, Ser: 1.28 — ABNORMAL HIGH
GFR calc Af Amer: 51 — ABNORMAL LOW
GFR calc Af Amer: 55 — ABNORMAL LOW
GFR calc non Af Amer: 41 — ABNORMAL LOW
GFR calc non Af Amer: 42 — ABNORMAL LOW
GFR calc non Af Amer: 50 — ABNORMAL LOW
Glucose, Bld: 68 — ABNORMAL LOW
Glucose, Bld: 71
Glucose, Bld: 83
Potassium: 5
Potassium: 5.4 — ABNORMAL HIGH
Sodium: 138

## 2011-05-23 LAB — DIFFERENTIAL
Basophils Absolute: 0
Eosinophils Relative: 4
Lymphocytes Relative: 29
Lymphs Abs: 3.8 — ABNORMAL HIGH
Neutro Abs: 8.2 — ABNORMAL HIGH
Neutrophils Relative %: 62

## 2011-05-23 LAB — CROSSMATCH
ABO/RH(D): O POS
Antibody Screen: NEGATIVE

## 2011-05-27 LAB — CBC
HCT: 23.8 — ABNORMAL LOW
HCT: 29.1 — ABNORMAL LOW
MCHC: 33.8
MCV: 73.3 — ABNORMAL LOW
MCV: 73.6 — ABNORMAL LOW
Platelets: 373
Platelets: 443 — ABNORMAL HIGH
RDW: 18.7 — ABNORMAL HIGH
WBC: 14 — ABNORMAL HIGH
WBC: 9.1

## 2011-05-27 LAB — CROSSMATCH: ABO/RH(D): O POS

## 2011-05-27 LAB — BASIC METABOLIC PANEL
BUN: 18
CO2: 24
Chloride: 103
Creatinine, Ser: 0.85
Potassium: 4.5

## 2011-05-29 LAB — ANAEROBIC CULTURE

## 2011-05-29 LAB — CBC
HCT: 22.5 — ABNORMAL LOW
HCT: 28.2 — ABNORMAL LOW
Hemoglobin: 7.6 — CL
Hemoglobin: 8.5 — ABNORMAL LOW
Hemoglobin: 9.4 — ABNORMAL LOW
MCHC: 33.9
MCHC: 34.1
MCV: 75.7 — ABNORMAL LOW
MCV: 76.1 — ABNORMAL LOW
RBC: 2.96 — ABNORMAL LOW
RBC: 3.12 — ABNORMAL LOW
RBC: 3.23 — ABNORMAL LOW
RBC: 3.72 — ABNORMAL LOW
RDW: 18.7 — ABNORMAL HIGH
WBC: 15.9 — ABNORMAL HIGH

## 2011-05-29 LAB — DIFFERENTIAL
Basophils Absolute: 0
Basophils Relative: 0
Basophils Relative: 0
Eosinophils Relative: 2
Monocytes Absolute: 1.2 — ABNORMAL HIGH
Monocytes Relative: 8
Monocytes Relative: 9
Neutro Abs: 7.9 — ABNORMAL HIGH
Neutro Abs: 9.4 — ABNORMAL HIGH
Neutrophils Relative %: 67

## 2011-05-29 LAB — BASIC METABOLIC PANEL
Chloride: 100
GFR calc non Af Amer: >60
Potassium: 3.9
Sodium: 133 — ABNORMAL LOW

## 2011-05-29 LAB — CROSSMATCH

## 2011-05-29 LAB — WOUND CULTURE

## 2011-06-10 ENCOUNTER — Ambulatory Visit (HOSPITAL_COMMUNITY)
Admission: AD | Admit: 2011-06-10 | Discharge: 2011-06-11 | Disposition: A | Discharge status: Home / Self Care | Payer: Medicare Other | Source: Ambulatory Visit | Attending: Orthopaedic Surgery | Admitting: Orthopaedic Surgery

## 2011-06-10 ENCOUNTER — Ambulatory Visit (HOSPITAL_COMMUNITY): Payer: Medicare Other

## 2011-06-10 DIAGNOSIS — I10 Essential (primary) hypertension: Secondary | ICD-10-CM | POA: Insufficient documentation

## 2011-06-10 DIAGNOSIS — K219 Gastro-esophageal reflux disease without esophagitis: Secondary | ICD-10-CM | POA: Insufficient documentation

## 2011-06-10 DIAGNOSIS — T8789 Other complications of amputation stump: Secondary | ICD-10-CM | POA: Insufficient documentation

## 2011-06-10 DIAGNOSIS — E119 Type 2 diabetes mellitus without complications: Secondary | ICD-10-CM | POA: Insufficient documentation

## 2011-06-10 DIAGNOSIS — Y835 Amputation of limb(s) as the cause of abnormal reaction of the patient, or of later complication, without mention of misadventure at the time of the procedure: Secondary | ICD-10-CM | POA: Insufficient documentation

## 2011-06-10 HISTORY — PX: OTHER SURGICAL HISTORY: SHX169

## 2011-06-10 LAB — BASIC METABOLIC PANEL
BUN: 24 mg/dL — ABNORMAL HIGH (ref 6–23)
GFR calc Af Amer: 50 mL/min — ABNORMAL LOW (ref >90)
GFR calc non Af Amer: 43 mL/min — ABNORMAL LOW (ref >90)
Potassium: 5 mEq/L (ref 3.5–5.1)
Sodium: 139 mEq/L (ref 135–145)

## 2011-06-10 LAB — PROTIME-INR
INR: 0.98 (ref 0.00–1.49)
Prothrombin Time: 13.2 seconds (ref 11.6–15.2)

## 2011-06-10 LAB — CBC
Platelets: 222 10*3/uL (ref 150–400)
RBC: 4.58 MIL/uL (ref 3.87–5.11)
WBC: 8.2 10*3/uL (ref 4.0–10.5)

## 2011-06-10 LAB — SURGICAL PCR SCREEN: MRSA, PCR: NEGATIVE

## 2011-06-10 LAB — GLUCOSE, CAPILLARY: Glucose-Capillary: 104 mg/dL — ABNORMAL HIGH (ref 70–99)

## 2011-06-11 LAB — GLUCOSE, CAPILLARY
Glucose-Capillary: 167 mg/dL — ABNORMAL HIGH (ref 70–99)
Glucose-Capillary: 254 mg/dL — ABNORMAL HIGH (ref 70–99)

## 2011-06-11 NOTE — Discharge Summary (Signed)
  [name], [name]              ACCOUNT NO.:  [id number]  MEDICAL RECORD NO.:  [id number]  LOCATION:  5002                         FACILITY:  [facility]  PHYSICIAN:  Vanita Panda. 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  PROCEDURE:  Irrigation and debridement of end of right below-knee amputation stump on June 10, 2011.  HOSPITAL COURSE:  [name] is a 70 year old female who in 2008 underwent a right below-knee amputation secondary to peripheral vascular disease and diabetes.  She has done well for a long period of time and has ambulated with a prosthetic.  She came to the office yesterday with a small wound at the end of her below-knee amputation stump and there was associated infection.  She was taken to the operating room, where she underwent a debridement of the end of the stump all the way down the bone.  I did not find any evidence of osteomyelitis and it seemed to appear that this was soft tissue mainly.  I was able to clean up the soft tissue, and debride the soft tissue and bone, and then closed the stump and put on a nice dressing.  She was then admitted for overnight antibiotics and observation.  By the day of discharge the next day, her pain was well controlled.  She was afebrile, stable vital signs.  Her dressing was intact and it was felt she could be discharged safely to home.  DISCHARGE DISPOSITION:  Discharged to home.  DISCHARGE INSTRUCTIONS:  While she is at home, she will only use her wheelchair and not wear her prosthetic until further notice.  I will see her back in the office next week.  DISCHARGE MEDICATIONS: 1. Norco as needed for pain. 2. Doxycycline as an  antibiotic. 3. She will continue every single one of the same home medications as     before as listed on the home medical reconciliation orders.     Vanita Panda. Magnus Ivan, M.D.     CYB/MEDQ  D:  06/11/2011  T:  06/11/2011  Job:  [id number]  Electronically Signed by Doneen Poisson M.D. on 06/11/2011 08:00:27 PM

## 2011-06-11 NOTE — Op Note (Signed)
[name], [name]              ACCOUNT NO.:  [id number]  MEDICAL RECORD NO.:  [id number]  LOCATION:  5002                         FACILITY:  [facility]  PHYSICIAN:  Vanita Panda. Magnus Ivan, M.D.DATE OF BIRTH:  1941-08-08  DATE OF PROCEDURE:  06/10/2011 DATE OF DISCHARGE:                              OPERATIVE REPORT   PREOPERATIVE DIAGNOSIS:  Right below-knee amputation stump, wound breakdown with questionable infection.  POSTOPERATIVE DIAGNOSIS:  Right below-knee amputation stump, wound breakdown with questionable infection.  PROCEDURES:  Irrigation and debridement of right below-knee amputation stump with debridement of skin, soft tissue, and bone.  FINDINGS:  No evidence of osteomyelitis with questionable soft tissue infection versus cyst from stump breakdown.  CULTURES:  Pending.  Questionable whether this is from poor-fitting prosthesis.  SURGEON:  Vanita Panda. Magnus Ivan, MD  ANESTHESIA:  General.  BLOOD LOSS:  Minimal.  TOURNIQUET TIME:  15 minutes.  COMPLICATIONS:  None.  INDICATIONS:  [name] is 70 year old patient well known to me.  She is a diabetic and in 2008, underwent a right below-knee amputation, was eventually fitted with a prosthesis and has done well for many years. Recently, she noted that she had a small wound that is opened up at the end of her stump and this was actually proximal to our incision.  She came into the office today and there was a small wound and I could express material from this, and it almost appeared to be a sebaceous type of cyst, but there is question whether there is infection with the drainage.  I talked to her about coming to the operating room for opening this further, cleaning the soft tissue and bone and closing this with the idea that she would need prosthetic refitting later once the wound is healed.  X-rays in the office did not show any evidence of osteomyelitis.  PROCEDURE DESCRIPTION:  After informed consent  was obtained, appropriate right BKA stump was marked.  She was brought to the operating room, placed on the operating table.  General anesthesia was then obtained.  A nonsterile tourniquet was placed around her upper right thigh and her leg was prepped and draped with DuraPrep, and the right leg was prepped with DuraPrep and sterile drapes.  A time-out was called to identify the correct patient, correct right BKA stump.  I then excised out this small tract that was mainly in the soft tissues.  I then made incision around this as well and dissected down to the bone.  I did not find any pocket of gross purulence, and I still think cultures and Gram stain, and then we gave her antibiotics which was a gram of Ancef.  I used a knife to debride this questionable necrotic tissue in this area.  I rongeured into the bone, it was not soft, and I even used an oscillating saw to just barely shave the end of the bone.  I then used 3 L of normal saline solution and pulsatile lavage to lavage out the wound and I closed withnice padding deeply over the bone with 0 Vicryl suture followed by 2-0 Vicryl subcutaneous tissue, and staples on the skin.  Xeroform followed by well-padded sterile dressing was applied.  She is awake and extubated and taken to recovery  room in stable condition.  Postoperatively, we will admit her for overnight observation, and then I will discharge her tomorrow with an idea that we will not let her ambulate on her prosthesis until this wound heals.     Vanita Panda. Magnus Ivan, M.D.     CYB/MEDQ  D:  06/10/2011  T:  06/11/2011  Job:  [id number]  Electronically Signed by Doneen Poisson M.D. on 06/11/2011 08:00:25 PM

## 2011-06-15 LAB — ANAEROBIC CULTURE

## 2011-09-27 ENCOUNTER — Other Ambulatory Visit: Payer: Self-pay | Admitting: Urology

## 2011-10-09 ENCOUNTER — Encounter (HOSPITAL_BASED_OUTPATIENT_CLINIC_OR_DEPARTMENT_OTHER): Payer: Self-pay | Admitting: *Deleted

## 2011-10-09 NOTE — Progress Notes (Signed)
NPO AFTER MN. ARRIVES AT 0830. NEEDS ISTAT. CURRENT EKG W/ CHART. WILL TAKE METOPROLOL AND PRILOSEC AM OF SURG. W/ SIP OF WATER.

## 2011-10-11 NOTE — H&P (Signed)
History of Present Illness        71 y/o white female with h/o TCC/CIS bladder cancer presents for acute evaluation of dysuria, frequency and urgency since Thursday 09/12/11.  She denies gross hematuria, flank pain, fever or chills.  Today, she has noticed some diffuse low back pain.  She has not been on abx recently.  She typically has silent UTIs which are only diagnosed on urinalysis or when she develops fever/chills/malaise.  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  She has had recent leg surgery which is why she has not been back for follow up until now.   Past Medical History Problems  1. History of  Arthritis V13.4 2. History of  Diabetes Mellitus 250.00 3. History of  Esophageal Reflux 530.81 4. Former Smoker V15.82 5. History of  Glaucoma 365.9  Surgical History Problems  1. History of  Amputation Of Leg Below Knee Right 2. History of  Appendectomy 3. History of  Back Surgery 4. History of  Cystoscopy With Biopsy 5. History of  Cystoscopy With Fulguration Large Lesion (Over 5cm) 6. History of  Eye Surgery 7. History of  Gallbladder Surgery  Current Meds 1. AmLODIPine Besylate 5 MG Oral Tablet; Therapy: (Recorded:21Feb2012) to 2. Aspirin 81 MG Oral Tablet; Therapy: (Recorded:21Feb2012) to 3. Benicar 20 MG Oral Tablet; Therapy: (Recorded:21Feb2012) to 4. CeleBREX 200 MG Oral Capsule; Therapy: (Recorded:21Feb2012) to 5. Furosemide 20 MG Oral Tablet; Therapy: 12Jan2012 to 6. Hydrochlorothiazide 12.5 MG Oral Tablet; Therapy: (Recorded:21Feb2012) to 7. Lantus SoloStar 100 UNIT/ML Subcutaneous Solution; Therapy: (Recorded:21Feb2012) to 8. MetFORMIN HCl 1000 MG Oral Tablet; Therapy: (Recorded:21Feb2012) to 9. Metoprolol Tartrate 50 MG Oral Tablet; Therapy: 12Jan2012 to 10. Omega 3 CAPS; Therapy: (Recorded:21Feb2012) to 11. PreserVision AREDS Oral Capsule; Therapy: (Recorded:05Feb2013) to 12. Simvastatin 20 MG Oral Tablet;  Therapy: (Recorded:21Feb2012) to 13. Uribel 118 MG Oral Capsule; TAKE 1 CAPSULE 3 times daily PRN burning; Therapy: 17Sep2012   to (Last Rx:17Sep2012) 14. Uribel 118 MG Oral Capsule; TAKE 1 CAPSULE 4 times daily PRN; Therapy: 17Sep2012 to (Last   Rx:17Sep2012)  Allergies Medication  1. Biaxin TABS  Family History Problems  1. Family history of  Acute Myocardial Infarction V17.3 2. Family history of  Diabetes Mellitus V18.0  Social History Problems  1. Caffeine Use 3-4 2. Marital History - Divorced V61.03 Denied  3. History of  Alcohol Use 4. History of  Retired From Work  Review of Systems Genitourinary, constitutional, skin, eye, otolaryngeal, hematologic/lymphatic, cardiovascular, pulmonary, endocrine, musculoskeletal, gastrointestinal, neurological and psychiatric system(s) were reviewed and pertinent findings if present are noted.  Genitourinary: urinary frequency, urinary urgency, dysuria, urinary hesitancy, urinary stream starts and stops and suprapubic pain.    Vitals Vital Signs Blood Pressure: 141 / 70 Temperature: 98 F Heart Rate: 86  Physical Exam Constitutional: Well nourished and well developed . No acute distress.  ENT:. The ears and nose are normal in appearance.  Neck: The appearance of the neck is normal and no neck mass is present.  Pulmonary: No respiratory distress and normal respiratory rhythm and effort.  Cardiovascular:. No peripheral edema.  Skin: Normal skin turgor, no visible rash and no visible skin lesions.  Neuro/Psych:. Mood and affect are appropriate.    Results/Data  The following clinical lab reports were reviewed: Marland Kitchen  Urinalysis Selected Results  UA With REFLEX  [name], [name]   Test Name Result Flag Reference  COLOR YELLOW  YELLOW  APPEARANCE CLOUDY A CLEAR  SPECIFIC  GRAVITY 1.025  1.005-1.030  pH 6.0  5.0-8.0  GLUCOSE > 1000 mg/dL A NEG  BILIRUBIN NEG  NEG  KETONE NEG mg/dL  NEG  BLOOD MOD A NEG  PROTEIN 100 mg/dL A NEG    UROBILINOGEN 0.2 mg/dL  5.2-8.4  NITRITE POS A NEG  LEUKOCYTE ESTERASE MOD A NEG  SQUAMOUS EPITHELIAL/HPF RARE  RARE  WBC TNTC WBC/hpf A <4  RBC 0-3 RBC/hpf  <4  BACTERIA MANY A RARE  CRYSTALS NONE SEEN  NEG  CASTS NONE SEEN  NEG   Assessment Assessed  1. Urinary Tract Infection 599.0 2. Pyuria 791.9  Plan Health Maintenance (V70.0)  1. UA With REFLEX  Done: 05Feb2013 11:52AM   Urine C&S sent.  Start Cipro 500mg  po BID x10 days emprically and adjust prn based on final report. Schedule cystoscopy with bladder bxs with Dr. Vernie Ammons. She will came by the office for drop off U/A prior to procedure and it was clear.

## 2011-10-14 ENCOUNTER — Encounter (HOSPITAL_BASED_OUTPATIENT_CLINIC_OR_DEPARTMENT_OTHER)
Admission: RE | Disposition: A | Discharge status: Home / Self Care | Payer: Self-pay | Source: Ambulatory Visit | Attending: Urology

## 2011-10-14 ENCOUNTER — Ambulatory Visit (HOSPITAL_BASED_OUTPATIENT_CLINIC_OR_DEPARTMENT_OTHER)
Admission: RE | Admit: 2011-10-14 | Discharge: 2011-10-14 | Disposition: A | Discharge status: Home / Self Care | Payer: Medicare Other | Source: Ambulatory Visit | Attending: Urology | Admitting: Urology

## 2011-10-14 ENCOUNTER — Ambulatory Visit (HOSPITAL_BASED_OUTPATIENT_CLINIC_OR_DEPARTMENT_OTHER): Payer: Medicare Other | Admitting: Anesthesiology

## 2011-10-14 ENCOUNTER — Encounter (HOSPITAL_BASED_OUTPATIENT_CLINIC_OR_DEPARTMENT_OTHER): Payer: Self-pay | Admitting: Anesthesiology

## 2011-10-14 ENCOUNTER — Encounter (HOSPITAL_BASED_OUTPATIENT_CLINIC_OR_DEPARTMENT_OTHER): Payer: Self-pay | Admitting: *Deleted

## 2011-10-14 DIAGNOSIS — R82998 Other abnormal findings in urine: Secondary | ICD-10-CM | POA: Insufficient documentation

## 2011-10-14 DIAGNOSIS — C679 Malignant neoplasm of bladder, unspecified: Secondary | ICD-10-CM

## 2011-10-14 DIAGNOSIS — K219 Gastro-esophageal reflux disease without esophagitis: Secondary | ICD-10-CM | POA: Insufficient documentation

## 2011-10-14 DIAGNOSIS — D090 Carcinoma in situ of bladder: Secondary | ICD-10-CM | POA: Insufficient documentation

## 2011-10-14 DIAGNOSIS — Z79899 Other long term (current) drug therapy: Secondary | ICD-10-CM | POA: Insufficient documentation

## 2011-10-14 DIAGNOSIS — N390 Urinary tract infection, site not specified: Secondary | ICD-10-CM | POA: Insufficient documentation

## 2011-10-14 DIAGNOSIS — E119 Type 2 diabetes mellitus without complications: Secondary | ICD-10-CM | POA: Insufficient documentation

## 2011-10-14 DIAGNOSIS — Z7982 Long term (current) use of aspirin: Secondary | ICD-10-CM | POA: Insufficient documentation

## 2011-10-14 HISTORY — DX: Unspecified macular degeneration: H35.30

## 2011-10-14 HISTORY — DX: Personal history of other diseases of the circulatory system: Z86.79

## 2011-10-14 HISTORY — DX: Polyneuropathy, unspecified: G62.9

## 2011-10-14 HISTORY — DX: Personal history of malignant neoplasm of bladder: Z85.51

## 2011-10-14 HISTORY — DX: Other specified diabetes mellitus with unspecified diabetic retinopathy without macular edema: E13.319

## 2011-10-14 HISTORY — DX: Anemia, unspecified: D64.9

## 2011-10-14 HISTORY — DX: Essential (primary) hypertension: I10

## 2011-10-14 HISTORY — DX: Encounter for other specified aftercare: Z51.89

## 2011-10-14 HISTORY — DX: Acquired absence of unspecified leg below knee: Z89.519

## 2011-10-14 HISTORY — PX: CYSTOSCOPY WITH BIOPSY: SHX5122

## 2011-10-14 HISTORY — DX: Reserved for inherently not codable concepts without codable children: IMO0001

## 2011-10-14 HISTORY — DX: Peripheral vascular disease, unspecified: I73.9

## 2011-10-14 HISTORY — DX: Hyperlipidemia, unspecified: E78.5

## 2011-10-14 HISTORY — DX: Gastro-esophageal reflux disease without esophagitis: K21.9

## 2011-10-14 LAB — POCT I-STAT 4, (NA,K, GLUC, HGB,HCT)
Glucose, Bld: 135 mg/dL — ABNORMAL HIGH (ref 70–99)
HCT: 35 % — ABNORMAL LOW (ref 36.0–46.0)
Hemoglobin: 11.9 g/dL — ABNORMAL LOW (ref 12.0–15.0)
Potassium: 3.8 mEq/L (ref 3.5–5.1)
Sodium: 144 mEq/L (ref 135–145)

## 2011-10-14 LAB — GLUCOSE, CAPILLARY: Glucose-Capillary: 138 mg/dL — ABNORMAL HIGH (ref 70–99)

## 2011-10-14 SURGERY — CYSTOSCOPY, WITH BIOPSY
Anesthesia: General | Site: Bladder | Wound class: Clean Contaminated

## 2011-10-14 MED ORDER — SODIUM CHLORIDE 0.9 % IR SOLN
Status: DC | PRN
  Administered 2011-10-14: 500 mL

## 2011-10-14 MED ORDER — GLYCOPYRROLATE 0.2 MG/ML IJ SOLN
INTRAMUSCULAR | Status: DC | PRN
  Administered 2011-10-14: 0.2 mg via INTRAVENOUS

## 2011-10-14 MED ORDER — PHENAZOPYRIDINE HCL 200 MG PO TABS
200.0000 mg | ORAL_TABLET | Freq: Once | ORAL | Status: AC
  Administered 2011-10-14: 200 mg via ORAL

## 2011-10-14 MED ORDER — FENTANYL CITRATE 0.05 MG/ML IJ SOLN: 25.0000 ug | INTRAMUSCULAR | Status: DC | PRN

## 2011-10-14 MED ORDER — STERILE WATER FOR IRRIGATION IR SOLN
Status: DC | PRN
  Administered 2011-10-14: 3000 mL

## 2011-10-14 MED ORDER — CIPROFLOXACIN IN D5W 400 MG/200ML IV SOLN
400.0000 mg | INTRAVENOUS | Status: AC
  Administered 2011-10-14: 400 mg via INTRAVENOUS

## 2011-10-14 MED ORDER — LACTATED RINGERS IV SOLN
INTRAVENOUS | Status: DC | PRN
  Administered 2011-10-14: 09:00:00 via INTRAVENOUS

## 2011-10-14 MED ORDER — BELLADONNA ALKALOIDS-OPIUM 16.2-60 MG RE SUPP
RECTAL | Status: DC | PRN
  Administered 2011-10-14: 1 via RECTAL

## 2011-10-14 MED ORDER — ONDANSETRON HCL 4 MG/2ML IJ SOLN
INTRAMUSCULAR | Status: DC | PRN
  Administered 2011-10-14: 4 mg via INTRAVENOUS

## 2011-10-14 MED ORDER — HYDROCODONE-ACETAMINOPHEN 10-300 MG PO TABS: 1.0000 | ORAL_TABLET | Freq: Four times a day (QID) | ORAL | Status: DC | PRN

## 2011-10-14 MED ORDER — FUROSEMIDE 20 MG PO TABS
10.0000 mg | ORAL_TABLET | Freq: Every day | ORAL | Status: DC
  Administered 2011-10-14: 10 mg via ORAL
  Filled 2011-10-14: qty 0.5

## 2011-10-14 MED ORDER — KETOROLAC TROMETHAMINE 30 MG/ML IJ SOLN
INTRAMUSCULAR | Status: DC | PRN
  Administered 2011-10-14: 30 mg via INTRAVENOUS

## 2011-10-14 MED ORDER — CIPROFLOXACIN IN D5W 400 MG/200ML IV SOLN
INTRAVENOUS | Status: DC | PRN
  Administered 2011-10-14: 400 mg via INTRAVENOUS

## 2011-10-14 MED ORDER — LACTATED RINGERS IV SOLN
INTRAVENOUS | Status: DC
  Administered 2011-10-14: 09:00:00 via INTRAVENOUS

## 2011-10-14 MED ORDER — LACTATED RINGERS IV SOLN: INTRAVENOUS | Status: DC

## 2011-10-14 MED ORDER — OXYBUTYNIN CHLORIDE 5 MG PO TABS
5.0000 mg | ORAL_TABLET | Freq: Once | ORAL | Status: AC
  Administered 2011-10-14: 5 mg via ORAL

## 2011-10-14 MED ORDER — LIDOCAINE HCL (CARDIAC) 20 MG/ML IV SOLN
INTRAVENOUS | Status: DC | PRN
  Administered 2011-10-14: 100 mg via INTRAVENOUS

## 2011-10-14 MED ORDER — PROPOFOL 10 MG/ML IV EMUL
INTRAVENOUS | Status: DC | PRN
  Administered 2011-10-14: 200 mg via INTRAVENOUS

## 2011-10-14 MED ORDER — FENTANYL CITRATE 0.05 MG/ML IJ SOLN
INTRAMUSCULAR | Status: DC | PRN
  Administered 2011-10-14 (×2): 50 ug via INTRAVENOUS

## 2011-10-14 MED ORDER — PHENAZOPYRIDINE HCL 200 MG PO TABS: 200.0000 mg | ORAL_TABLET | Freq: Three times a day (TID) | ORAL | Status: AC | PRN

## 2011-10-14 SURGICAL SUPPLY — 41 items
ADAPTER CATH URET PLST 4-6FR (CATHETERS) IMPLANT
ADPR CATH URET STRL DISP 4-6FR (CATHETERS)
BAG DRAIN URO-CYSTO SKYTR STRL (DRAIN) ×3 IMPLANT
BAG DRN UROCATH (DRAIN) ×2
BASKET LASER NITINOL 1.9FR (BASKET) IMPLANT
BASKET SEGURA 3FR (UROLOGICAL SUPPLIES) IMPLANT
BASKET STNLS GEMINI 4WIRE 3FR (BASKET) IMPLANT
BASKET ZERO TIP NITINOL 2.4FR (BASKET) IMPLANT
BRUSH URET BIOPSY 3F (UROLOGICAL SUPPLIES) IMPLANT
BSKT STON RTRVL 120 1.9FR (BASKET)
BSKT STON RTRVL GEM 120X11 3FR (BASKET)
BSKT STON RTRVL ZERO TP 2.4FR (BASKET)
CANISTER SUCT LVC 12 LTR MEDI- (MISCELLANEOUS) ×2 IMPLANT
CATH INTERMIT  6FR 70CM (CATHETERS) IMPLANT
CATH URET 5FR 28IN CONE TIP (BALLOONS)
CATH URET 5FR 28IN OPEN ENDED (CATHETERS) IMPLANT
CATH URET 5FR 70CM CONE TIP (BALLOONS) IMPLANT
CLOTH BEACON ORANGE TIMEOUT ST (SAFETY) ×3 IMPLANT
DRAPE CAMERA CLOSED 9X96 (DRAPES) ×3 IMPLANT
ELECT REM PT RETURN 9FT ADLT (ELECTROSURGICAL)
ELECTRODE REM PT RTRN 9FT ADLT (ELECTROSURGICAL) IMPLANT
GLOVE BIO SURGEON STRL SZ8 (GLOVE) ×3 IMPLANT
GLOVE BIOGEL PI IND STRL 6.5 (GLOVE) ×2 IMPLANT
GLOVE BIOGEL PI INDICATOR 6.5 (GLOVE) ×2
GOWN STRL REIN XL XLG (GOWN DISPOSABLE) ×3 IMPLANT
GOWN SURGICAL LARGE (GOWNS) ×2 IMPLANT
GOWN XL W/COTTON TOWEL STD (GOWNS) ×3 IMPLANT
GUIDEWIRE 0.038 PTFE COATED (WIRE) ×1 IMPLANT
GUIDEWIRE ANG ZIPWIRE 038X150 (WIRE) IMPLANT
GUIDEWIRE STR DUAL SENSOR (WIRE) IMPLANT
IV NS IRRIG 3000ML ARTHROMATIC (IV SOLUTION) IMPLANT
KIT BALLIN UROMAX 15FX10 (LABEL) IMPLANT
KIT BALLN UROMAX 15FX4 (MISCELLANEOUS) IMPLANT
KIT BALLN UROMAX 26 75X4 (MISCELLANEOUS)
NS IRRIG 500ML POUR BTL (IV SOLUTION) ×2 IMPLANT
PACK CYSTOSCOPY (CUSTOM PROCEDURE TRAY) ×3 IMPLANT
SET HIGH PRES BAL DIL (LABEL)
SHEATH URET ACCESS 12FR/35CM (UROLOGICAL SUPPLIES) IMPLANT
SHEATH URET ACCESS 12FR/55CM (UROLOGICAL SUPPLIES) IMPLANT
SYRINGE IRR TOOMEY STRL 70CC (SYRINGE) ×2 IMPLANT
WATER STERILE IRR 3000ML UROMA (IV SOLUTION) ×3 IMPLANT

## 2011-10-14 NOTE — Progress Notes (Signed)
Reported to D. Burleson, Rn as caregiver

## 2011-10-14 NOTE — Op Note (Signed)
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   SURGEON:  Surgeon(s): Garnett Farm  ANESTHESIA:   General  EBL:  Minimal   SPECIMEN: Biopsies from the right wall, left wall, posterior wall and bladder base for pathologic evaluation and barbotaged specimen for cytology.  DISPOSITION OF SPECIMEN:  PATHOLOGY  Indication: Mrs. 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 She underwent a six-week induction course of full-strength BCG and repeat bladder biopsies were positive for CIS. We therefore discussed the treatment options and she elected to proceed with a second course of BCG. After completing that she was doing well but developed some irritative voiding symptoms and a urine culture was found to be positive. After being treated with antibiotics she has now presented for repeat cystoscopy with bladder biopsies.  Description of operation: The patient was taken to the operating room and administered general anesthesia. He was then placed on the table and moved to the dorsal lithotomy position after which his genitalia was sterilely prepped and draped. An official timeout was then performed.  The 22 French cystoscope was passed under direct vision into the bladder. I noted some old scars on the right and left walls of the bladder inferiorly as well as what appeared to be some possible early papillary lesions on the posterior wall near the floor of the bladder. The ureteral orifices were noted to be in normal position and of normal configuration. No definite papillary tumors were identified in the bladder.  The cold cup biopsy forceps were then introduced and cold cup biopsies were obtained from the above-noted  locations. I then fulgurated these biopsy sites. I then used normal saline to perform barbotaged of the bladder and this was sent for cytology. The bladder was drained, the cystoscope was removed and the patient was awakened and taken to the recovery room in stable and satisfactory condition. She tolerated procedure well no intraoperative complications.  PLAN OF CARE: Discharge to home after PACU  PATIENT DISPOSITION:  PACU - hemodynamically stable.

## 2011-10-14 NOTE — Progress Notes (Signed)
Spoke w/ amanda yarbrough,PA .  Informed of bladder scan of 132( the most )and the avg volume being 109-112. 2 liters iv fluids as well as 1 liter po intake w/ .only 10 cc voided.lasix 10mg  po given at 1649. No sob, denies dyspnea.  But does have some peripheral edema in lle.  Dr. Vernie Ammons aware. Order obtained to increase iv to 100cc/hr x 1 1/2 hrs. If these abnormal clinical findings persist, appropriate workup will be completed. The patient understands that follow up is required to elucidate the situation. No void call amanda.

## 2011-10-14 NOTE — Anesthesia Preprocedure Evaluation (Addendum)
Anesthesia Evaluation  Patient identified by MRN, date of birth, ID band Patient awake    Reviewed: Allergy & Precautions, H&P , NPO status , Patient's Chart, lab work & pertinent test results, reviewed documented beta blocker date and time   Airway Mallampati: II TM Distance: >3 FB Neck ROM: full    Dental No notable dental hx. (+) Teeth Intact and Dental Advisory Given   Pulmonary neg pulmonary ROS,  breath sounds clear to auscultation  Pulmonary exam normal       Cardiovascular Exercise Tolerance: Good hypertension, Rhythm:regular Rate:Normal     Neuro/Psych Periph. neuropathy negative neurological ROS  negative psych ROS   GI/Hepatic negative GI ROS, Neg liver ROS, GERD-  Medicated and Controlled,  Endo/Other  Diabetes mellitus-, Well Controlled, Type 2, Insulin DependentMorbid obesity  Renal/GU negative Renal ROS  negative genitourinary   Musculoskeletal   Abdominal (+) + obese,   Peds  Hematology negative hematology ROS (+)   Anesthesia Other Findings   Reproductive/Obstetrics negative OB ROS                          Anesthesia Physical Anesthesia Plan  ASA: III  Anesthesia Plan: General LMA   Post-op Pain Management:    Induction: Intravenous  Airway Management Planned: LMA  Additional Equipment:   Intra-op Plan:   Post-operative Plan:   Informed Consent: I have reviewed the patients History and Physical, chart, labs and discussed the procedure including the risks, benefits and alternatives for the proposed anesthesia with the patient or authorized representative who has indicated his/her understanding and acceptance.   Dental Advisory Given  Plan Discussed with: CRNA and Surgeon  Anesthesia Plan Comments:         Anesthesia Quick Evaluation

## 2011-10-14 NOTE — Progress Notes (Signed)
C/o peripheral edema in l ankle and leg. Pt asking to take mormal dose of lasix.  Dr. Rica Mast paged and informed.  Pt to take dose at home.   Dr. Vernie Ammons paged via beeper.

## 2011-10-14 NOTE — Progress Notes (Signed)
Dr. Vernie Ammons informed of inability to urinate x 10cc's. Pt drinking, talking.  Denies sob.but has peripheral edema l leg/ankle. Ok to give lasix 10mg  per at home.  Pt aware.  Will continue to monitor

## 2011-10-14 NOTE — Anesthesia Postprocedure Evaluation (Signed)
  Anesthesia Post-op Note  Patient: [name]  Procedure(s) Performed: Procedure(s) (LRB): CYSTOSCOPY WITH BIOPSY (N/A)  Patient Location: PACU  Anesthesia Type: General  Level of Consciousness: awake and alert   Airway and Oxygen Therapy: Patient Spontanous Breathing  Post-op Pain: mild  Post-op Assessment: Post-op Vital signs reviewed, Patient's Cardiovascular Status Stable, Respiratory Function Stable, Patent Airway and No signs of Nausea or vomiting  Post-op Vital Signs: stable  Complications: No apparent anesthesia complications

## 2011-10-14 NOTE — Interval H&P Note (Signed)
History and Physical Interval Note:  10/14/2011 8:26 AM  [name]  has presented today for surgery, with the diagnosis of history of bladder cancer  The various methods of treatment have been discussed with the patient and family. After consideration of risks, benefits and other options for treatment, the patient has consented to  Procedure(s) (LRB): CYSTOSCOPY (N/A) as a surgical intervention .  The patients' history has been reviewed, patient examined, no change in status, stable for surgery.  I have reviewed the patients' chart and labs.  Questions were answered to the patient's satisfaction.     Garnett Farm

## 2011-10-14 NOTE — Progress Notes (Signed)
Reported to B. Johnson, RN as caregiver 

## 2011-10-14 NOTE — Anesthesia Procedure Notes (Signed)
Procedure Name: LMA Insertion Date/Time: 10/14/2011 9:53 AM Performed by: Iline Oven Pre-anesthesia Checklist: Patient identified, Emergency Drugs available, Suction available and Patient being monitored Patient Re-evaluated:Patient Re-evaluated prior to inductionOxygen Delivery Method: Circle System Utilized Preoxygenation: Pre-oxygenation with 100% oxygen Intubation Type: IV induction Ventilation: Mask ventilation without difficulty LMA: LMA inserted LMA Size: 4.0 Number of attempts: 1 Airway Equipment and Method: bite block Placement Confirmation: positive ETCO2 Tube secured with: Tape Dental Injury: Teeth and Oropharynx as per pre-operative assessment

## 2011-10-14 NOTE — Transfer of Care (Signed)
Immediate Anesthesia Transfer of Care Note  Patient: [name]  Procedure(s) Performed: Procedure(s) (LRB): CYSTOSCOPY (N/A)  Patient Location: PACU  Anesthesia Type: General  Level of Consciousness: awake, sedated, patient cooperative and responds to stimulation  Airway & Oxygen Therapy: Patient Spontanous Breathing and Patient connected to face mask oxygen  Post-op Assessment: Report given to PACU RN, Post -op Vital signs reviewed and stable and Patient moving all extremities  Post vital signs: Reviewed and stable  Complications: No apparent anesthesia complications

## 2011-10-15 ENCOUNTER — Encounter (HOSPITAL_BASED_OUTPATIENT_CLINIC_OR_DEPARTMENT_OTHER): Payer: Self-pay | Admitting: Urology

## 2011-10-15 LAB — GLUCOSE, CAPILLARY: Glucose-Capillary: 91 mg/dL (ref 70–99)

## 2012-09-29 ENCOUNTER — Encounter (HOSPITAL_COMMUNITY): Payer: Self-pay | Admitting: *Deleted

## 2012-09-29 ENCOUNTER — Emergency Department (HOSPITAL_COMMUNITY)
Admission: EM | Admit: 2012-09-29 | Discharge: 2012-09-30 | Disposition: A | Discharge status: Home / Self Care | Payer: Medicare Other | Attending: Emergency Medicine | Admitting: Emergency Medicine

## 2012-09-29 DIAGNOSIS — Z5189 Encounter for other specified aftercare: Secondary | ICD-10-CM

## 2012-09-29 DIAGNOSIS — I739 Peripheral vascular disease, unspecified: Secondary | ICD-10-CM | POA: Insufficient documentation

## 2012-09-29 DIAGNOSIS — Z8739 Personal history of other diseases of the musculoskeletal system and connective tissue: Secondary | ICD-10-CM | POA: Insufficient documentation

## 2012-09-29 DIAGNOSIS — I10 Essential (primary) hypertension: Secondary | ICD-10-CM | POA: Insufficient documentation

## 2012-09-29 DIAGNOSIS — I059 Rheumatic mitral valve disease, unspecified: Secondary | ICD-10-CM | POA: Insufficient documentation

## 2012-09-29 DIAGNOSIS — Z90710 Acquired absence of both cervix and uterus: Secondary | ICD-10-CM | POA: Insufficient documentation

## 2012-09-29 DIAGNOSIS — Z87891 Personal history of nicotine dependence: Secondary | ICD-10-CM | POA: Insufficient documentation

## 2012-09-29 DIAGNOSIS — Z906 Acquired absence of other parts of urinary tract: Secondary | ICD-10-CM | POA: Insufficient documentation

## 2012-09-29 DIAGNOSIS — D649 Anemia, unspecified: Secondary | ICD-10-CM | POA: Insufficient documentation

## 2012-09-29 DIAGNOSIS — Z8719 Personal history of other diseases of the digestive system: Secondary | ICD-10-CM | POA: Insufficient documentation

## 2012-09-29 DIAGNOSIS — Z7902 Long term (current) use of antithrombotics/antiplatelets: Secondary | ICD-10-CM | POA: Insufficient documentation

## 2012-09-29 DIAGNOSIS — I2510 Atherosclerotic heart disease of native coronary artery without angina pectoris: Secondary | ICD-10-CM | POA: Insufficient documentation

## 2012-09-29 DIAGNOSIS — Z9889 Other specified postprocedural states: Secondary | ICD-10-CM | POA: Insufficient documentation

## 2012-09-29 DIAGNOSIS — E1339 Other specified diabetes mellitus with other diabetic ophthalmic complication: Secondary | ICD-10-CM | POA: Insufficient documentation

## 2012-09-29 DIAGNOSIS — E11319 Type 2 diabetes mellitus with unspecified diabetic retinopathy without macular edema: Secondary | ICD-10-CM | POA: Insufficient documentation

## 2012-09-29 DIAGNOSIS — E785 Hyperlipidemia, unspecified: Secondary | ICD-10-CM | POA: Insufficient documentation

## 2012-09-29 DIAGNOSIS — Z791 Long term (current) use of non-steroidal anti-inflammatories (NSAID): Secondary | ICD-10-CM | POA: Insufficient documentation

## 2012-09-29 DIAGNOSIS — Z79899 Other long term (current) drug therapy: Secondary | ICD-10-CM | POA: Insufficient documentation

## 2012-09-29 DIAGNOSIS — IMO0002 Reserved for concepts with insufficient information to code with codable children: Secondary | ICD-10-CM | POA: Insufficient documentation

## 2012-09-29 DIAGNOSIS — S88119A Complete traumatic amputation at level between knee and ankle, unspecified lower leg, initial encounter: Secondary | ICD-10-CM | POA: Insufficient documentation

## 2012-09-29 DIAGNOSIS — Y833 Surgical operation with formation of external stoma as the cause of abnormal reaction of the patient, or of later complication, without mention of misadventure at the time of the procedure: Secondary | ICD-10-CM | POA: Insufficient documentation

## 2012-09-29 DIAGNOSIS — Z8679 Personal history of other diseases of the circulatory system: Secondary | ICD-10-CM | POA: Insufficient documentation

## 2012-09-29 DIAGNOSIS — Z8669 Personal history of other diseases of the nervous system and sense organs: Secondary | ICD-10-CM | POA: Insufficient documentation

## 2012-09-29 DIAGNOSIS — K219 Gastro-esophageal reflux disease without esophagitis: Secondary | ICD-10-CM | POA: Insufficient documentation

## 2012-09-29 DIAGNOSIS — Z794 Long term (current) use of insulin: Secondary | ICD-10-CM | POA: Insufficient documentation

## 2012-09-29 DIAGNOSIS — Z8551 Personal history of malignant neoplasm of bladder: Secondary | ICD-10-CM | POA: Insufficient documentation

## 2012-09-29 DIAGNOSIS — E875 Hyperkalemia: Secondary | ICD-10-CM | POA: Insufficient documentation

## 2012-09-29 DIAGNOSIS — Z7982 Long term (current) use of aspirin: Secondary | ICD-10-CM | POA: Insufficient documentation

## 2012-09-29 LAB — COMPREHENSIVE METABOLIC PANEL
Alkaline Phosphatase: 104 U/L (ref 39–117)
BUN: 37 mg/dL — ABNORMAL HIGH (ref 6–23)
CO2: 16 mEq/L — ABNORMAL LOW (ref 19–32)
Chloride: 110 mEq/L (ref 96–112)
Creatinine, Ser: 1.47 mg/dL — ABNORMAL HIGH (ref 0.50–1.10)
GFR calc non Af Amer: 35 mL/min — ABNORMAL LOW (ref >90)
Glucose, Bld: 173 mg/dL — ABNORMAL HIGH (ref 70–99)
Potassium: 5.5 mEq/L — ABNORMAL HIGH (ref 3.5–5.1)
Total Bilirubin: 0.3 mg/dL (ref 0.3–1.2)

## 2012-09-29 LAB — CBC WITH DIFFERENTIAL/PLATELET
HCT: 24.8 % — ABNORMAL LOW (ref 36.0–46.0)
Hemoglobin: 8.2 g/dL — ABNORMAL LOW (ref 12.0–15.0)
Lymphocytes Relative: 15 % (ref 12–46)
Lymphs Abs: 2.1 10*3/uL (ref 0.7–4.0)
MCHC: 33.1 g/dL (ref 30.0–36.0)
Monocytes Absolute: 0.9 10*3/uL (ref 0.1–1.0)
Monocytes Relative: 7 % (ref 3–12)
Neutro Abs: 9.9 10*3/uL — ABNORMAL HIGH (ref 1.7–7.7)
Neutrophils Relative %: 73 % (ref 43–77)
RBC: 2.93 MIL/uL — ABNORMAL LOW (ref 3.87–5.11)
WBC: 13.7 10*3/uL — ABNORMAL HIGH (ref 4.0–10.5)

## 2012-09-29 LAB — TYPE AND SCREEN
ABO/RH(D): O POS
Antibody Screen: NEGATIVE

## 2012-09-29 MED ORDER — MORPHINE SULFATE 2 MG/ML IJ SOLN
2.0000 mg | Freq: Once | INTRAMUSCULAR | Status: AC
  Administered 2012-09-30: 2 mg via INTRAVENOUS
  Filled 2012-09-29: qty 1

## 2012-09-29 MED ORDER — ACETAMINOPHEN 325 MG PO TABS
650.0000 mg | ORAL_TABLET | Freq: Once | ORAL | Status: AC
  Administered 2012-09-29: 650 mg via ORAL
  Filled 2012-09-29: qty 2

## 2012-09-29 MED ORDER — ONDANSETRON HCL 4 MG/2ML IJ SOLN
4.0000 mg | Freq: Once | INTRAMUSCULAR | Status: AC
  Administered 2012-09-30: 4 mg via INTRAVENOUS
  Filled 2012-09-29: qty 2

## 2012-09-29 MED ORDER — SODIUM CHLORIDE 0.9 % IV BOLUS (SEPSIS)
1000.0000 mL | Freq: Once | INTRAVENOUS | Status: AC
  Administered 2012-09-29: 1000 mL via INTRAVENOUS

## 2012-09-29 MED ORDER — SODIUM CHLORIDE 0.9 % IV SOLN: INTRAVENOUS | Status: DC

## 2012-09-29 MED ORDER — CYCLOBENZAPRINE HCL 10 MG PO TABS
5.0000 mg | ORAL_TABLET | Freq: Once | ORAL | Status: AC
  Administered 2012-09-30: 5 mg via ORAL
  Filled 2012-09-29: qty 2
  Filled 2012-09-29: qty 1

## 2012-09-29 NOTE — ED Provider Notes (Signed)
History     CSN: [id number]  Arrival date & time 09/29/12  1741   First MD Initiated Contact with Patient 09/29/12 2045      Chief Complaint  Patient presents with  . Wound Infection    (Consider location/radiation/quality/duration/timing/severity/associated sxs/prior treatment) HPI  Pt with a very complicated history presents to the ED with complaints of urine and bleeding coming from her wounds as well as a large amount of fluids coming from her surgical wounds. The Urostomy was placed on January 28,2014 during the removal of her bladder, a total hysterectomy and part of her vagina was removed as well. Pt says that since being discharge from the hospital she has also had a heart attack. She had the procedure done by Dr. Willette Pa at [facility], Urologist. She wanted to get to [facility] this afternoon instead of come here to [facility] but was unable to get there due to her symptoms and being unable to get a ride. Her home health nurse recommended that she come to the ER due to her wounds appearing to not be healing well. Due to the weather the at home nurse was unable to come to tend to the Urostomy and wounds for 5 days. She has chronic conditions hypertension, DM, CAD, GERD, arthritis. of  Her temperature and BP are WNL.    Past Medical History  Diagnosis Date  . Hypertension   . Hyperlipidemia   . History of atrial tachycardia CONTROLLED W/ LOPRESSOR  . Peripheral vascular disease S/P RIGHT BKA  . Headache   . Peripheral neuropathy LEGS AND HANDS  . Arthritis   . S/P BKA (below knee amputation) unilateral RIGHT  . GERD (gastroesophageal reflux disease)   . Anemia   . Blood transfusion   . Diabetes mellitus ORAL AND INSULIN MEDS  . Macular degeneration of both eyes   . Glaucoma(365)   . Retinopathy due to secondary diabetes mellitus   . Insomnia   . Constipation   . History of bladder cancer TCC AND CIS  . MI (mitral incompetence)   . Coronary artery disease     Past Surgical  History  Procedure Laterality Date  . I & d right below knee amputation wound  06-10-2011  . Cysto / resection bladder bx's  04-01-11  &  11-26-10  . Below knee leg amputation  05-02-2007    RIGHT  . Right foot i & d / removal necrotic bone  JUN  &  AUG 2008  . Right great toe amputation  11-05-2006  . Laparoscopic cholecystectomy  12-10-1999  . Back surgery  1991   &  1971  . Appendectomy  1963  . Cataract extraction w/ intraocular lens  implant, bilateral    . Cystoscopy with biopsy  10/14/2011    Procedure: CYSTOSCOPY WITH BIOPSY;  Surgeon: Garnett Farm, MD;  Location: [facility];  Service: Urology;  Laterality: N/A;  BLADDER BIOPSY  . Coronary stent placement    . Abdominal hysterectomy    . Revision urostomy cutaneous    . Revision urostomy cutaneous      No family history on file.  History  Substance Use Topics  . Smoking status: Former Smoker -- 2.00 packs/day for 17 years    Types: Cigarettes    Quit date: 10/09/1975  . Smokeless tobacco: Never Used  . Alcohol Use: Yes     Comment: RARE    OB History   Grav Para Term Preterm Abortions TAB SAB Ect Mult Living  Review of Systems  All other systems reviewed and are negative.    Allergies  Clarithromycin  Home Medications   Current Outpatient Rx  Name  Route  Sig  Dispense  Refill  . aspirin 81 MG chewable tablet   Oral   Chew 81 mg by mouth daily.         Marland Kitchen atorvastatin (LIPITOR) 80 MG tablet   Oral   Take 80 mg by mouth daily.         . bisacodyl (DULCOLAX) 5 MG EC tablet   Oral   Take 5 mg by mouth 2 (two) times daily.         . celecoxib (CELEBREX) 100 MG capsule   Oral   Take 100 mg by mouth daily.         . clopidogrel (PLAVIX) 75 MG tablet   Oral   Take 75 mg by mouth daily.         . hydrochlorothiazide (MICROZIDE) 12.5 MG capsule   Oral   Take 12.5 mg by mouth daily as needed (for fluid retention).         . Hydrocodone-Acetaminophen  (VICODIN HP) 10-300 MG TABS   Oral   Take 1-2 tablets by mouth every 6 (six) hours as needed.   20 each   0   . Insulin Glargine (LANTUS [place])   Subcutaneous   Inject 55 Units into the skin 2 (two) times daily.          . insulin lispro (HUMALOG) 100 UNIT/ML injection   Subcutaneous   Inject into the skin 4 (four) times daily. PER SLIDING SCALE         . lisinopril (PRINIVIL,ZESTRIL) 5 MG tablet   Oral   Take 5 mg by mouth daily.         . Melatonin 10 MG TABS   Oral   Take 1 tablet by mouth at bedtime.         . metoprolol succinate (TOPROL-XL) 50 MG 24 hr tablet   Oral   Take 50 mg by mouth daily. Take with or immediately following a meal.         . Multiple Vitamins-Minerals (VISION-VITE PRESERVE PO)   Oral   Take 1 tablet by mouth daily.          Marland Kitchen omeprazole (PRILOSEC) 20 MG capsule   Oral   Take 20 mg by mouth every morning.         Marland Kitchen OVER THE COUNTER MEDICATION   Oral   Take 4-5 tablets by mouth at bedtime. "Vegetable Lax" has to take 4 to 5 at night to remain loose           BP 113/66  Pulse 110  Temp(Src) 100.2 F (37.9 C) (Rectal)  Resp 17  SpO2 100%  Physical Exam  Nursing note and vitals reviewed. Constitutional: She appears well-developed and well-nourished. No distress.  HENT:  Head: Normocephalic and atraumatic.  Eyes: Pupils are equal, round, and reactive to light.  Neck: Normal range of motion. Neck supple.  Cardiovascular: Normal rate and regular rhythm.   Pulmonary/Chest: Effort normal.  Abdominal: Soft.    Musculoskeletal:       Legs: Neurological: She is alert.  Skin: Skin is warm and dry.    ED Course  Procedures (including critical care time)  Labs Reviewed  CBC WITH DIFFERENTIAL - Abnormal; Notable for the following:    WBC 13.7 (*)    RBC 2.93 (*)  Hemoglobin 8.2 (*)    HCT 24.8 (*)    RDW 17.3 (*)    Neutro Abs 9.9 (*)    All other components within normal limits  COMPREHENSIVE METABOLIC PANEL -  Abnormal; Notable for the following:    Potassium 5.5 (*)    CO2 16 (*)    Glucose, Bld 173 (*)    BUN 37 (*)    Creatinine, Ser 1.47 (*)    Albumin 2.5 (*)    GFR calc non Af Amer 35 (*)    GFR calc Af Amer 40 (*)    All other components within normal limits  CULTURE, BLOOD (ROUTINE X 2)  CULTURE, BLOOD (ROUTINE X 2)  LACTIC ACID, PLASMA  TYPE AND SCREEN   No results found.   1. Visit for wound check   2. Hyperkalemia   3. Anemia       MDM   Date: 09/29/2012  Rate: 104  Rhythm:sinus tachycardia  QRS Axis: normal  Intervals: normal  ST/T Wave abnormalities: normal  Conduction Disutrbances:none  Narrative Interpretation:   Old EKG Reviewed: unchanged   Discussed case with Dr. Jeraldine Loots, will call Transfer Service to speak with Urologist at [facility]  Discussed case with [facility]. Roselyn Bering at [facility] and discussed the case with him. Labs, noteably hemoglobin had improved 0.5 points from previous lab value a couple of weeks ago without blood transfusion. With the procedure that the patient had it is normal to have vaginal discharge and some bleeding for a while. Dr. Willette Pa, the patients urologist actually saw patient yesterday and evaluated all of her complaints. Her clinical picture has not changed from them until now.  Plan:   IV Fluids  Treat potassium Re-check Potassium Replace Ostomy bag -If patients potassium and symptoms have improved she can be dc'd. [facility] will call her tomorrow to check on her and if needed she can go back to [facility] and they well treat her.    ___ Discussed plan with patient and made Dr. Jeraldine Loots aware of plan. Everyone is on board.     2:44am- pt stay extended because of getting materials for the ostomy bag and difficulty getting IV access. Surgical nurse actually came down to replace bag for [place]. CBG is 103, pt received  Liter of fluids. Her vitals remain stable here in the ED. Potassium on recheck after fluids is 4.7 which is now normal. Pt  to be dc, [facility] will call her today to check on her.   Pt has been advised of the symptoms that warrant their return to the ED. Patient has voiced understanding and has agreed to follow-up with the PCP or specialist.       Dorthula Matas, PA 09/30/12 0246  Dorthula Matas, PA 09/30/12 [phone]

## 2012-09-29 NOTE — ED Notes (Signed)
Lannette Donath, [phone]

## 2012-09-29 NOTE — ED Notes (Signed)
Pt with urostomy placed on 1/28 d/t bladder CA.  Pt noticed discomfort today around the ostomy.  Surgical suture sites under fold of R abd and under ostomy dressing.  Urine is leaking out of openings.  Pt is also concerned with sacral ulcer that has not been addressed by MD in 1 week.

## 2012-09-29 NOTE — ED Notes (Signed)
Attempted to start IV X 2 RN without success with contact IV team for IV start.

## 2012-09-30 NOTE — Progress Notes (Signed)
Ed RN called Chartered loss adjuster to ED 25 for help with several open wounds and dressing. Stoma to be included in the dressing change. Author accompanied with Ranae Palms RN III. Wound cleansed with betadine and packed with 1' dressing below umbilicus area. Pubic wound also cleansed with betadine and covered with mepilex dressing. Sacral wound cleansed and dressed with mepilex dressing. Stoma intact. Benzoin applied with new bag appliance applied. [name]

## 2012-09-30 NOTE — ED Notes (Signed)
  CBG 103  

## 2012-10-01 NOTE — ED Provider Notes (Signed)
  Medical screening examination/treatment/procedure(s) were performed by non-physician practitioner and as supervising physician I was immediately available for consultation/collaboration.  On my exam the patient was in no distress.  The patient's wounds, though not healed, did not look overtly infected.  The patient was mentating appropriately, with vital signs were appropriate for her recent intervention.  After evaluation here, discussed with her surgical team, she was discharged in stable condition with next a followup, and return precautions.  I saw the ECG (if appropriate), relevant labs and studies - I agree with the interpretation.    Gerhard Munch, MD 10/01/12 [phone]

## 2012-10-06 LAB — CULTURE, BLOOD (ROUTINE X 2): Culture: NO GROWTH

## 2012-11-13 ENCOUNTER — Inpatient Hospital Stay (HOSPITAL_COMMUNITY)
Admission: EM | Admit: 2012-11-13 | Discharge: 2012-11-18 | DRG: 689 | Disposition: A | Discharge status: Home Health | Payer: Medicare Other | Attending: Internal Medicine | Admitting: Internal Medicine

## 2012-11-13 ENCOUNTER — Encounter (HOSPITAL_COMMUNITY): Payer: Self-pay | Admitting: Emergency Medicine

## 2012-11-13 DIAGNOSIS — N390 Urinary tract infection, site not specified: Principal | ICD-10-CM | POA: Diagnosis present

## 2012-11-13 DIAGNOSIS — Z8551 Personal history of malignant neoplasm of bladder: Secondary | ICD-10-CM

## 2012-11-13 DIAGNOSIS — I739 Peripheral vascular disease, unspecified: Secondary | ICD-10-CM | POA: Diagnosis present

## 2012-11-13 DIAGNOSIS — E872 Acidosis, unspecified: Secondary | ICD-10-CM | POA: Diagnosis present

## 2012-11-13 DIAGNOSIS — S88119A Complete traumatic amputation at level between knee and ankle, unspecified lower leg, initial encounter: Secondary | ICD-10-CM

## 2012-11-13 DIAGNOSIS — Z79899 Other long term (current) drug therapy: Secondary | ICD-10-CM

## 2012-11-13 DIAGNOSIS — Z794 Long term (current) use of insulin: Secondary | ICD-10-CM

## 2012-11-13 DIAGNOSIS — G9340 Encephalopathy, unspecified: Secondary | ICD-10-CM

## 2012-11-13 DIAGNOSIS — I2510 Atherosclerotic heart disease of native coronary artery without angina pectoris: Secondary | ICD-10-CM | POA: Diagnosis present

## 2012-11-13 DIAGNOSIS — E119 Type 2 diabetes mellitus without complications: Secondary | ICD-10-CM | POA: Diagnosis present

## 2012-11-13 DIAGNOSIS — E1139 Type 2 diabetes mellitus with other diabetic ophthalmic complication: Secondary | ICD-10-CM | POA: Diagnosis present

## 2012-11-13 DIAGNOSIS — G9341 Metabolic encephalopathy: Secondary | ICD-10-CM | POA: Diagnosis present

## 2012-11-13 DIAGNOSIS — K219 Gastro-esophageal reflux disease without esophagitis: Secondary | ICD-10-CM | POA: Diagnosis present

## 2012-11-13 DIAGNOSIS — H409 Unspecified glaucoma: Secondary | ICD-10-CM | POA: Diagnosis present

## 2012-11-13 DIAGNOSIS — E785 Hyperlipidemia, unspecified: Secondary | ICD-10-CM | POA: Diagnosis present

## 2012-11-13 DIAGNOSIS — C679 Malignant neoplasm of bladder, unspecified: Secondary | ICD-10-CM

## 2012-11-13 DIAGNOSIS — E11319 Type 2 diabetes mellitus with unspecified diabetic retinopathy without macular edema: Secondary | ICD-10-CM | POA: Diagnosis present

## 2012-11-13 DIAGNOSIS — H3530 Unspecified macular degeneration: Secondary | ICD-10-CM | POA: Diagnosis present

## 2012-11-13 DIAGNOSIS — N289 Disorder of kidney and ureter, unspecified: Secondary | ICD-10-CM | POA: Diagnosis present

## 2012-11-13 DIAGNOSIS — E1149 Type 2 diabetes mellitus with other diabetic neurological complication: Secondary | ICD-10-CM | POA: Diagnosis present

## 2012-11-13 DIAGNOSIS — E1142 Type 2 diabetes mellitus with diabetic polyneuropathy: Secondary | ICD-10-CM | POA: Diagnosis present

## 2012-11-13 DIAGNOSIS — I10 Essential (primary) hypertension: Secondary | ICD-10-CM | POA: Diagnosis present

## 2012-11-13 LAB — URINE MICROSCOPIC-ADD ON

## 2012-11-13 LAB — URINALYSIS, ROUTINE W REFLEX MICROSCOPIC
Bilirubin Urine: NEGATIVE
Glucose, UA: NEGATIVE mg/dL
Ketones, ur: NEGATIVE mg/dL
Protein, ur: 100 mg/dL — AB

## 2012-11-13 LAB — CBC
HCT: 28.7 % — ABNORMAL LOW (ref 36.0–46.0)
MCV: 82 fL (ref 78.0–100.0)
Platelets: 297 10*3/uL (ref 150–400)
RBC: 3.5 MIL/uL — ABNORMAL LOW (ref 3.87–5.11)
WBC: 8.9 10*3/uL (ref 4.0–10.5)

## 2012-11-13 LAB — POCT I-STAT, CHEM 8
BUN: 44 mg/dL — ABNORMAL HIGH (ref 6–23)
Chloride: 122 mEq/L — ABNORMAL HIGH (ref 96–112)
Creatinine, Ser: 1.5 mg/dL — ABNORMAL HIGH (ref 0.50–1.10)
Glucose, Bld: 185 mg/dL — ABNORMAL HIGH (ref 70–99)
Potassium: 4.7 mEq/L (ref 3.5–5.1)

## 2012-11-13 MED ORDER — SODIUM CHLORIDE 0.9 % IV SOLN: INTRAVENOUS | Status: DC

## 2012-11-13 NOTE — ED Notes (Signed)
Bed:WA06<BR> Expected date:<BR> Expected time:<BR> Means of arrival:<BR> Comments:<BR>

## 2012-11-13 NOTE — ED Notes (Signed)
Past week Pt has felt increasingly weak. Pt unable to transfer from bed to wheelchair. Wednesday Ptt started taking double of blood pressure due to doctor prescription because doctor was concerned about heart rate. Has not had much of appetite. Pt fell this morning after trying to transfer from bed chair.

## 2012-11-14 ENCOUNTER — Emergency Department (HOSPITAL_COMMUNITY): Payer: Medicare Other

## 2012-11-14 ENCOUNTER — Encounter (HOSPITAL_COMMUNITY): Payer: Self-pay | Admitting: Internal Medicine

## 2012-11-14 DIAGNOSIS — G9340 Encephalopathy, unspecified: Secondary | ICD-10-CM

## 2012-11-14 DIAGNOSIS — E872 Acidosis: Secondary | ICD-10-CM | POA: Diagnosis present

## 2012-11-14 DIAGNOSIS — I10 Essential (primary) hypertension: Secondary | ICD-10-CM | POA: Diagnosis present

## 2012-11-14 DIAGNOSIS — E119 Type 2 diabetes mellitus without complications: Secondary | ICD-10-CM

## 2012-11-14 LAB — SAMPLE TO BLOOD BANK

## 2012-11-14 LAB — COMPREHENSIVE METABOLIC PANEL
AST: 17 U/L (ref 0–37)
BUN: 43 mg/dL — ABNORMAL HIGH (ref 6–23)
CO2: 10 mEq/L — CL (ref 19–32)
Chloride: 111 mEq/L (ref 96–112)
Creatinine, Ser: 1.62 mg/dL — ABNORMAL HIGH (ref 0.50–1.10)
GFR calc non Af Amer: 31 mL/min — ABNORMAL LOW (ref >90)
Total Bilirubin: 0.2 mg/dL — ABNORMAL LOW (ref 0.3–1.2)

## 2012-11-14 LAB — BLOOD GAS, ARTERIAL
Drawn by: 244901
FIO2: 0.21 %
pCO2 arterial: 26 mmHg — ABNORMAL LOW (ref 35.0–45.0)
pO2, Arterial: 99.5 mmHg (ref 80.0–100.0)

## 2012-11-14 LAB — CBC
MCH: 26.3 pg (ref 26.0–34.0)
MCHC: 32.5 g/dL (ref 30.0–36.0)
Platelets: 260 10*3/uL (ref 150–400)
RBC: 3.35 MIL/uL — ABNORMAL LOW (ref 3.87–5.11)
RDW: 17.7 % — ABNORMAL HIGH (ref 11.5–15.5)

## 2012-11-14 LAB — BASIC METABOLIC PANEL
Calcium: 10.8 mg/dL — ABNORMAL HIGH (ref 8.4–10.5)
Chloride: 110 mEq/L (ref 96–112)
Creatinine, Ser: 1.52 mg/dL — ABNORMAL HIGH (ref 0.50–1.10)
GFR calc Af Amer: 32 mL/min — ABNORMAL LOW (ref >90)
GFR calc non Af Amer: 33 mL/min — ABNORMAL LOW (ref >90)
Potassium: 4.7 mEq/L (ref 3.5–5.1)
Sodium: 133 mEq/L — ABNORMAL LOW (ref 135–145)

## 2012-11-14 LAB — GLUCOSE, CAPILLARY

## 2012-11-14 MED ORDER — SODIUM CHLORIDE 0.9 % IJ SOLN
3.0000 mL | Freq: Two times a day (BID) | INTRAMUSCULAR | Status: DC
  Administered 2012-11-16: 11:00:00 via INTRAVENOUS
  Administered 2012-11-16 – 2012-11-18 (×4): 3 mL via INTRAVENOUS

## 2012-11-14 MED ORDER — PANTOPRAZOLE SODIUM 40 MG PO TBEC
40.0000 mg | DELAYED_RELEASE_TABLET | Freq: Every day | ORAL | Status: DC
  Administered 2012-11-14 – 2012-11-18 (×5): 40 mg via ORAL
  Filled 2012-11-14 (×5): qty 1

## 2012-11-14 MED ORDER — CELECOXIB 100 MG PO CAPS
100.0000 mg | ORAL_CAPSULE | Freq: Every evening | ORAL | Status: DC
  Administered 2012-11-14: 100 mg via ORAL
  Filled 2012-11-14 (×3): qty 1

## 2012-11-14 MED ORDER — VITAMIN D3 25 MCG (1000 UNIT) PO TABS
5000.0000 [IU] | ORAL_TABLET | Freq: Every morning | ORAL | Status: DC
  Administered 2012-11-14 – 2012-11-18 (×5): 5000 [IU] via ORAL
  Filled 2012-11-14 (×6): qty 5

## 2012-11-14 MED ORDER — INSULIN ASPART 100 UNIT/ML ~~LOC~~ SOLN
0.0000 [IU] | Freq: Three times a day (TID) | SUBCUTANEOUS | Status: DC
  Administered 2012-11-14: 4 [IU] via SUBCUTANEOUS
  Administered 2012-11-15: 7 [IU] via SUBCUTANEOUS
  Administered 2012-11-15: 3 [IU] via SUBCUTANEOUS
  Administered 2012-11-16: 4 [IU] via SUBCUTANEOUS
  Administered 2012-11-16: 11 [IU] via SUBCUTANEOUS
  Administered 2012-11-17 (×2): 3 [IU] via SUBCUTANEOUS
  Administered 2012-11-17: 4 [IU] via SUBCUTANEOUS
  Administered 2012-11-18: 7 [IU] via SUBCUTANEOUS

## 2012-11-14 MED ORDER — SENNA 8.6 MG PO TABS
1.0000 | ORAL_TABLET | Freq: Every day | ORAL | Status: DC | PRN
  Administered 2012-11-14: 17.2 mg via ORAL
  Filled 2012-11-14: qty 2

## 2012-11-14 MED ORDER — ASPIRIN 81 MG PO CHEW
81.0000 mg | CHEWABLE_TABLET | Freq: Every morning | ORAL | Status: DC
  Administered 2012-11-14 – 2012-11-18 (×5): 81 mg via ORAL
  Filled 2012-11-14 (×5): qty 1

## 2012-11-14 MED ORDER — OMEGA-3-ACID ETHYL ESTERS 1 G PO CAPS
1.0000 g | ORAL_CAPSULE | Freq: Every evening | ORAL | Status: DC
  Administered 2012-11-14 – 2012-11-17 (×4): 1 g via ORAL
  Filled 2012-11-14 (×6): qty 1

## 2012-11-14 MED ORDER — CLOPIDOGREL BISULFATE 75 MG PO TABS
75.0000 mg | ORAL_TABLET | Freq: Every day | ORAL | Status: DC
  Administered 2012-11-14 – 2012-11-18 (×5): 75 mg via ORAL
  Filled 2012-11-14 (×6): qty 1

## 2012-11-14 MED ORDER — DOCUSATE SODIUM 100 MG PO CAPS
100.0000 mg | ORAL_CAPSULE | Freq: Two times a day (BID) | ORAL | Status: DC
  Administered 2012-11-14 – 2012-11-18 (×9): 100 mg via ORAL
  Filled 2012-11-14 (×10): qty 1

## 2012-11-14 MED ORDER — LORATADINE 10 MG PO TABS
10.0000 mg | ORAL_TABLET | Freq: Every day | ORAL | Status: DC | PRN
Start: 2012-11-14 — End: 2012-11-18
  Filled 2012-11-14: qty 1

## 2012-11-14 MED ORDER — HEPARIN SODIUM (PORCINE) 5000 UNIT/ML IJ SOLN
5000.0000 [IU] | Freq: Three times a day (TID) | INTRAMUSCULAR | Status: DC
  Administered 2012-11-14 – 2012-11-18 (×13): 5000 [IU] via SUBCUTANEOUS
  Filled 2012-11-14 (×18): qty 1

## 2012-11-14 MED ORDER — INSULIN GLARGINE 100 UNIT/ML ~~LOC~~ SOLN
40.0000 [IU] | Freq: Two times a day (BID) | SUBCUTANEOUS | Status: DC
  Administered 2012-11-14 – 2012-11-18 (×9): 40 [IU] via SUBCUTANEOUS
  Filled 2012-11-14 (×12): qty 0.4

## 2012-11-14 MED ORDER — DEXTROSE 5 % IV SOLN
1.0000 g | INTRAVENOUS | Status: DC
  Administered 2012-11-14 – 2012-11-17 (×4): 1 g via INTRAVENOUS
  Filled 2012-11-14 (×5): qty 10

## 2012-11-14 MED ORDER — SODIUM CHLORIDE 0.9 % IV SOLN
INTRAVENOUS | Status: DC
  Administered 2012-11-14: 01:00:00 via INTRAVENOUS

## 2012-11-14 MED ORDER — HYDROCODONE-ACETAMINOPHEN 10-325 MG PO TABS
1.0000 | ORAL_TABLET | Freq: Four times a day (QID) | ORAL | Status: DC | PRN
  Administered 2012-11-16: 1 via ORAL
  Filled 2012-11-14: qty 1

## 2012-11-14 MED ORDER — NITROGLYCERIN 0.4 MG SL SUBL: 0.4000 mg | SUBLINGUAL_TABLET | SUBLINGUAL | Status: DC | PRN

## 2012-11-14 MED ORDER — ATORVASTATIN CALCIUM 80 MG PO TABS
80.0000 mg | ORAL_TABLET | Freq: Every evening | ORAL | Status: DC
  Administered 2012-11-14 – 2012-11-17 (×4): 80 mg via ORAL
  Filled 2012-11-14 (×6): qty 1

## 2012-11-14 MED ORDER — METOPROLOL SUCCINATE ER 100 MG PO TB24
100.0000 mg | ORAL_TABLET | Freq: Every evening | ORAL | Status: DC
  Administered 2012-11-14 – 2012-11-17 (×4): 100 mg via ORAL
  Filled 2012-11-14 (×6): qty 1

## 2012-11-14 MED ORDER — DEXTROSE 5 % IV SOLN
1.0000 g | Freq: Once | INTRAVENOUS | Status: AC
  Administered 2012-11-14: 1 g via INTRAVENOUS
  Filled 2012-11-14: qty 10

## 2012-11-14 MED ORDER — LISINOPRIL 5 MG PO TABS
5.0000 mg | ORAL_TABLET | Freq: Every morning | ORAL | Status: DC
  Administered 2012-11-14 – 2012-11-15 (×2): 5 mg via ORAL
  Filled 2012-11-14 (×3): qty 1

## 2012-11-14 MED ORDER — STERILE WATER FOR INJECTION IV SOLN
INTRAVENOUS | Status: DC
  Administered 2012-11-14 – 2012-11-16 (×4): via INTRAVENOUS
  Filled 2012-11-14 (×8): qty 850

## 2012-11-14 MED ORDER — INSULIN GLARGINE 100 UNIT/ML ~~LOC~~ SOLN: 30.0000 [IU] | Freq: Two times a day (BID) | SUBCUTANEOUS | Status: DC

## 2012-11-14 NOTE — ED Notes (Signed)
Critical CO2 called by Gavin Pound in lab. CO2 10. MD notified

## 2012-11-14 NOTE — ED Notes (Signed)
Pt transported to CT scan.

## 2012-11-14 NOTE — ED Notes (Signed)
Jesse Fall [phone] Password "Man"

## 2012-11-14 NOTE — Progress Notes (Signed)
Notified Claiborne Billings, Midlevel regarding pt's bp. No new orders given. Pt denies any complications and is currently drinking coffee. Will continue to monitor.

## 2012-11-14 NOTE — ED Notes (Signed)
Urostomy draining to foley bag.

## 2012-11-14 NOTE — ED Provider Notes (Signed)
History     CSN: [id number]  Arrival date & time 11/13/12  2247   First MD Initiated Contact with Patient 11/14/12 0013      Chief Complaint  Patient presents with  . Weakness    (Consider location/radiation/quality/duration/timing/severity/associated sxs/prior treatment) HPI History provided by patient and her daughter bedside. Increased confusion and generalized weakness tonight at home. Fell on her buttocks will transfer him from her bed to wheelchair tonight.  She has multiple complaints related to previous bladder resection 2 months ago, secondary to cancer, with operation at [facility].  She is concerned that she was sent home too early. She states right now that she needs to be admitted to hospital but and able to say specifically what for - she is very much against the idea of skilled nursing.  No fevers. No vomiting. No change in urine output her ostomy. No headaches. No abdominal pain. No chest pain or shortness of breath. No new medications. Symptoms moderate in severity. Past Medical History  Diagnosis Date  . Hypertension   . Hyperlipidemia   . History of atrial tachycardia CONTROLLED W/ LOPRESSOR  . Peripheral vascular disease S/P RIGHT BKA  . Headache   . Peripheral neuropathy LEGS AND HANDS  . Arthritis   . S/P BKA (below knee amputation) unilateral RIGHT  . GERD (gastroesophageal reflux disease)   . Anemia   . Blood transfusion   . Diabetes mellitus ORAL AND INSULIN MEDS  . Macular degeneration of both eyes   . Glaucoma(365)   . Retinopathy due to secondary diabetes mellitus   . Insomnia   . Constipation   . History of bladder cancer TCC AND CIS  . MI (mitral incompetence)   . Coronary artery disease     Past Surgical History  Procedure Laterality Date  . I & d right below knee amputation wound  06-10-2011  . Cysto / resection bladder bx's  04-01-11  &  11-26-10  . Below knee leg amputation  05-02-2007    RIGHT  . Right foot i & d / removal necrotic  bone  JUN  &  AUG 2008  . Right great toe amputation  11-05-2006  . Laparoscopic cholecystectomy  12-10-1999  . Back surgery  1991   &  1971  . Appendectomy  1963  . Cataract extraction w/ intraocular lens  implant, bilateral    . Cystoscopy with biopsy  10/14/2011    Procedure: CYSTOSCOPY WITH BIOPSY;  Surgeon: Garnett Farm, MD;  Location: [facility];  Service: Urology;  Laterality: N/A;  BLADDER BIOPSY  . Coronary stent placement    . Abdominal hysterectomy    . Revision urostomy cutaneous    . Revision urostomy cutaneous      No family history on file.  History  Substance Use Topics  . Smoking status: Former Smoker -- 2.00 packs/day for 17 years    Types: Cigarettes    Quit date: 10/09/1975  . Smokeless tobacco: Never Used  . Alcohol Use: Yes     Comment: RARE    OB History   Grav Para Term Preterm Abortions TAB SAB Ect Mult Living                  Review of Systems  Constitutional: Positive for fatigue. Negative for fever and chills.  HENT: Negative for neck pain and neck stiffness.   Eyes: Negative for pain.  Respiratory: Negative for shortness of breath.   Cardiovascular: Negative for chest pain.  Gastrointestinal: Negative for abdominal pain.  Genitourinary: Negative for dysuria.  Musculoskeletal: Negative for back pain.  Skin: Negative for rash.  Neurological: Negative for headaches.  All other systems reviewed and are negative.    Allergies  Clarithromycin; Lisinopril; and Metformin and related  Home Medications   Current Outpatient Rx  Name  Route  Sig  Dispense  Refill  . aspirin 81 MG chewable tablet   Oral   Chew 81 mg by mouth every morning.          Marland Kitchen atorvastatin (LIPITOR) 80 MG tablet   Oral   Take 80 mg by mouth every evening.          . docusate sodium (COLACE) 100 MG capsule   Oral   Take 100 mg by mouth 2 (two) times daily.         Marland Kitchen lisinopril (PRINIVIL,ZESTRIL) 5 MG tablet   Oral   Take 5 mg by mouth  every morning.          . bisacodyl (DULCOLAX) 5 MG EC tablet   Oral   Take 5 mg by mouth 2 (two) times daily.         . celecoxib (CELEBREX) 100 MG capsule   Oral   Take 100 mg by mouth daily.         . clopidogrel (PLAVIX) 75 MG tablet   Oral   Take 75 mg by mouth daily.         . hydrochlorothiazide (MICROZIDE) 12.5 MG capsule   Oral   Take 12.5 mg by mouth daily as needed (for fluid retention).         . Hydrocodone-Acetaminophen (VICODIN HP) 10-300 MG TABS   Oral   Take 1-2 tablets by mouth every 6 (six) hours as needed.   20 each   0   . Insulin Glargine (LANTUS [place])   Subcutaneous   Inject 55 Units into the skin 2 (two) times daily.          . insulin lispro (HUMALOG) 100 UNIT/ML injection   Subcutaneous   Inject into the skin 4 (four) times daily. PER SLIDING SCALE         . Melatonin 10 MG TABS   Oral   Take 1 tablet by mouth at bedtime.         . Multiple Vitamins-Minerals (VISION-VITE PRESERVE PO)   Oral   Take 1 tablet by mouth daily.          Marland Kitchen omeprazole (PRILOSEC) 20 MG capsule   Oral   Take 20 mg by mouth every morning.         Marland Kitchen OVER THE COUNTER MEDICATION   Oral   Take 4-5 tablets by mouth at bedtime. "Vegetable Lax" has to take 4 to 5 at night to remain loose           BP 126/67  Temp(Src) 97.6 F (36.4 C) (Oral)  Resp 15  SpO2 100%  Physical Exam  Constitutional: She appears well-developed and well-nourished.  HENT:  Head: Normocephalic and atraumatic.  Mouth/Throat: Oropharynx is clear and moist.  Eyes: EOM are normal. Pupils are equal, round, and reactive to light.  Neck: Normal range of motion. Neck supple.  Cardiovascular: Normal rate, regular rhythm and intact distal pulses.   Pulmonary/Chest: Effort normal and breath sounds normal. No respiratory distress.  Abdominal: Soft. Bowel sounds are normal. She exhibits no distension. There is no tenderness. There is no rebound and no guarding.  Urostomy bag  and  place  Musculoskeletal: She exhibits no edema.  Right lower ext prosthesis in place  Neurological: She is alert. No cranial nerve deficit.  Oriented to self, time and place and after sometime able to tell me who the president is  Skin: Skin is warm and dry.    ED Course  Procedures (including critical care time)  Results for orders placed during the hospital encounter of 11/13/12  CBC      Result Value Range   WBC 8.9  4.0 - 10.5 K/uL   RBC 3.50 (*) 3.87 - 5.11 MIL/uL   Hemoglobin 9.1 (*) 12.0 - 15.0 g/dL   HCT 16.1 (*) 09.6 - 04.5 %   MCV 82.0  78.0 - 100.0 fL   MCH 26.0  26.0 - 34.0 pg   MCHC 31.7  30.0 - 36.0 g/dL   RDW 40.9 (*) 81.1 - 91.4 %   Platelets 297  150 - 400 K/uL  COMPREHENSIVE METABOLIC PANEL      Result Value Range   Sodium 131 (*) 135 - 145 mEq/L   Potassium 4.5  3.5 - 5.1 mEq/L   Chloride 111  96 - 112 mEq/L   CO2 10 (*) 19 - 32 mEq/L   Glucose, Bld 184 (*) 70 - 99 mg/dL   BUN 43 (*) 6 - 23 mg/dL   Creatinine, Ser 7.82 (*) 0.50 - 1.10 mg/dL   Calcium 95.6 (*) 8.4 - 10.5 mg/dL   Total Protein 6.6  6.0 - 8.3 g/dL   Albumin 2.8 (*) 3.5 - 5.2 g/dL   AST 17  0 - 37 U/L   ALT 23  0 - 35 U/L   Alkaline Phosphatase 111  39 - 117 U/L   Total Bilirubin 0.2 (*) 0.3 - 1.2 mg/dL   GFR calc non Af Amer 31 (*) >90 mL/min   GFR calc Af Amer 36 (*) >90 mL/min  LIPASE, BLOOD      Result Value Range   Lipase 93 (*) 11 - 59 U/L  URINALYSIS, ROUTINE W REFLEX MICROSCOPIC      Result Value Range   Color, Urine YELLOW  YELLOW   APPearance CLOUDY (*) CLEAR   Specific Gravity, Urine 1.016  1.005 - 1.030   pH 6.5  5.0 - 8.0   Glucose, UA NEGATIVE  NEGATIVE mg/dL   Hgb urine dipstick SMALL (*) NEGATIVE   Bilirubin Urine NEGATIVE  NEGATIVE   Ketones, ur NEGATIVE  NEGATIVE mg/dL   Protein, ur 213 (*) NEGATIVE mg/dL   Urobilinogen, UA 0.2  0.0 - 1.0 mg/dL   Nitrite NEGATIVE  NEGATIVE   Leukocytes, UA LARGE (*) NEGATIVE  GLUCOSE, CAPILLARY      Result Value Range    Glucose-Capillary 181 (*) 70 - 99 mg/dL   Comment 1 Documented in Chart     Comment 2 Notify RN    URINE MICROSCOPIC-ADD ON      Result Value Range   Squamous Epithelial / LPF RARE  RARE   WBC, UA 21-50  <3 WBC/hpf   Bacteria, UA MANY (*) RARE   Crystals CA OXALATE CRYSTALS (*) NEGATIVE  POCT I-STAT, CHEM 8      Result Value Range   Sodium 139  135 - 145 mEq/L   Potassium 4.7  3.5 - 5.1 mEq/L   Chloride 122 (*) 96 - 112 mEq/L   BUN 44 (*) 6 - 23 mg/dL   Creatinine, Ser 0.86 (*) 0.50 - 1.10 mg/dL   Glucose, Bld 578 (*)  70 - 99 mg/dL   Calcium, Ion 1.61 (*) 1.13 - 1.30 mmol/L   TCO2 12  0 - 100 mmol/L   Hemoglobin 9.9 (*) 12.0 - 15.0 g/dL   HCT 09.6 (*) 04.5 - 40.9 %   Comment NOTIFIED PHYSICIAN    SAMPLE TO BLOOD BANK      Result Value Range   Blood Bank Specimen SAMPLE AVAILABLE FOR TESTING     Sample Expiration 11/17/2012     Dg Chest 2 View  11/14/2012  *RADIOLOGY REPORT*  Clinical Data: Weakness after falling.  History of hypertension, heart attack, bladder cancer.  CHEST - 2 VIEW  Comparison: 06/10/2011  Findings: Heart size is within normal limits accounting for the AP position.  Markings at the right lung base are slightly prominent but appear chronic.  There are no new focal consolidations or pleural effusions.  Old rib fractures are noted.  IMPRESSION:  1.  Stable right lower lobe densities. 2.  No evidence for acute cardiopulmonary disease.   Original Report Authenticated By: Norva Pavlov, M.D.    Dg Lumbar Spine Complete  11/14/2012  *RADIOLOGY REPORT*  Clinical Data: Low back pain after fall.  LUMBAR SPINE - COMPLETE 4+ VIEW  Comparison: None.  Findings: Five lumbar type vertebrae.  Diffuse bone demineralization.  Normal alignment of the lumbar vertebrae and facet joints.  Degenerative changes throughout the lumbar spine with narrowed lumbar interspaces and anterior osteophytes. Bridging osteophytes in the lower thoracic and upper lumbar spine. No vertebral compression  deformities.  No focal bone lesion or bone destruction.  Bone cortex and trabecular architecture appear intact.  Vascular calcifications.  IMPRESSION: Degenerative changes in the lumbar spine.  No displaced fractures identified.   Original Report Authenticated By: Burman Nieves, M.D.    Ct Head Wo Contrast  11/14/2012  *RADIOLOGY REPORT*  Clinical Data: Increasingly weak.  Unable to transfer from bed to wheel chair.  Decreased appetite.  Fell this morning.  CT HEAD WITHOUT CONTRAST  Technique:  Contiguous axial images were obtained from the base of the skull through the vertex without contrast.  Comparison: None.  Findings: There is encephalomalacia in the right temporal lobe, consistent with old infarct.  There is central cortical atrophy. Periventricular white matter changes are consistent with small vessel disease. There is no evidence for hemorrhage, mass lesion, or acute infarction.  No calvarial fracture.  Note is made of atherosclerotic calcification of the internal carotid arteries.  IMPRESSION:  1.  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 2.  Atrophy and small vessel disease. 3. No evidence for acute intracranial abnormality.   Original Report Authenticated By: Norva Pavlov, M.D.    IVFs provided  2:07 AM MED consult d/w DR Julian Reil, plan admit. IV ABx. IV fluids. U Cx pending   MDM  Confusion/ AMS/ UTI/ ostomy  UA, labs, imaging  IVFs  MED admit          Sunnie Nielsen, MD 11/14/12 [phone]

## 2012-11-14 NOTE — H&P (Signed)
[facility] History and Physical  [name]:[id number] DOB: 1941-04-11 DOA: 11/13/2012  Referring physician: ED PCP: Gaye Alken, MD  Specialists: Stefano Gaul)  Chief Complaint: AMS  HPI: [name] is a 72 y.o. female who presents with c/o generalized weakness, increased confusion.  Symptoms have been worse since she got an ilial conduit at [facility] she states, but really worse since Sunday.  She fell down while transferring from wheelchair to bed tonight, thankfully uninjured, the patient has been brought in by EMS.  Family is highly concerned about their ability to care for her.  In the ED patient was found to have bacteruira with pyuria possibly representing a UTI although no SIRS criteria were noted decision was made to treat given her AMS, additionally and significantly the patient was noted to have a fairly severe non-anion gap metabolic acidosis, hospitalist has been asked to admit.  Review of Systems: 12 systems reviewed and otherwise negative.  Past Medical History  Diagnosis Date  . Hypertension   . Hyperlipidemia   . History of atrial tachycardia CONTROLLED W/ LOPRESSOR  . Peripheral vascular disease S/P RIGHT BKA  . Headache   . Peripheral neuropathy LEGS AND HANDS  . Arthritis   . S/P BKA (below knee amputation) unilateral RIGHT  . GERD (gastroesophageal reflux disease)   . Anemia   . Blood transfusion   . Diabetes mellitus ORAL AND INSULIN MEDS  . Macular degeneration of both eyes   . Glaucoma(365)   . Retinopathy due to secondary diabetes mellitus   . Insomnia   . Constipation   . History of bladder cancer TCC AND CIS  . MI (mitral incompetence)   . Coronary artery disease    Past Surgical History  Procedure Laterality Date  . I & d right below knee amputation wound  06-10-2011  . Cysto / resection bladder bx's  04-01-11  &  11-26-10  . Below knee leg amputation  05-02-2007    RIGHT  . Right foot i & d / removal necrotic bone   JUN  &  AUG 2008  . Right great toe amputation  11-05-2006  . Laparoscopic cholecystectomy  12-10-1999  . Back surgery  1991   &  1971  . Appendectomy  1963  . Cataract extraction w/ intraocular lens  implant, bilateral    . Cystoscopy with biopsy  10/14/2011    Procedure: CYSTOSCOPY WITH BIOPSY;  Surgeon: Garnett Farm, MD;  Location: [facility];  Service: Urology;  Laterality: N/A;  BLADDER BIOPSY  . Coronary stent placement    . Abdominal hysterectomy    . Revision urostomy cutaneous    . Revision urostomy cutaneous     Social History:  reports that she quit smoking about 37 years ago. Her smoking use included Cigarettes. She has a 34 pack-year smoking history. She has never used smokeless tobacco. She reports that  drinks alcohol. She reports that she does not use illicit drugs.   Allergies  Allergen Reactions  . Clarithromycin Itching  . Lisinopril Other (See Comments)    Increased creatinine levels (pt currently taking low dose)  . Metformin And Related Other (See Comments)    Increased creatinine levels    Family History  Problem Relation Age of Onset  . Heart failure Mother     died age 41  . Sudden death Father 27     Prior to Admission medications   Medication Sig Start Date End Date Taking? Authorizing Provider  aspirin  81 MG chewable tablet Chew 81 mg by mouth every morning.    Yes Historical Provider, MD  atorvastatin (LIPITOR) 80 MG tablet Take 80 mg by mouth every evening.    Yes Historical Provider, MD  celecoxib (CELEBREX) 100 MG capsule Take 100 mg by mouth every evening.    Yes Historical Provider, MD  cholecalciferol (VITAMIN D) 1000 UNITS tablet Take 5,000 Units by mouth every morning.   Yes Historical Provider, MD  clopidogrel (PLAVIX) 75 MG tablet Take 75 mg by mouth daily.   Yes Historical Provider, MD  docusate sodium (COLACE) 100 MG capsule Take 100 mg by mouth 2 (two) times daily.   Yes Historical Provider, MD  fish oil-omega-3 fatty  acids 1000 MG capsule Take 1 g by mouth every evening.   Yes Historical Provider, MD  HYDROcodone-acetaminophen (NORCO) 10-325 MG per tablet Take 1 tablet by mouth every 6 (six) hours as needed for pain.   Yes Historical Provider, MD  insulin glargine (LANTUS) 100 UNIT/ML injection Inject 30-80 Units into the skin 2 (two) times daily. 80 units in the morning and 30 units at night   Yes Historical Provider, MD  insulin lispro (HUMALOG) 100 UNIT/ML injection Inject 20-50 Units into the skin daily as needed for high blood sugar. PER SLIDING SCALE   Yes Historical Provider, MD  lisinopril (PRINIVIL,ZESTRIL) 5 MG tablet Take 5 mg by mouth every morning.    Yes Historical Provider, MD  loratadine (CLARITIN) 10 MG tablet Take 10 mg by mouth daily as needed for allergies.   Yes Historical Provider, MD  Melatonin 10 MG TABS Take 1 tablet by mouth at bedtime.   Yes Historical Provider, MD  metoprolol succinate (TOPROL-XL) 100 MG 24 hr tablet Take 100 mg by mouth every evening. Take with or immediately following a meal.   Yes Historical Provider, MD  Multiple Vitamins-Minerals (PRESERVISION AREDS PO) Take 1 capsule by mouth 2 (two) times daily.   Yes Historical Provider, MD  nitroGLYCERIN (NITROSTAT) 0.4 MG SL tablet Place 0.4 mg under the tongue every 5 (five) minutes as needed for chest pain.   Yes Historical Provider, MD  omeprazole (PRILOSEC) 20 MG capsule Take 20 mg by mouth every morning.   Yes Historical Provider, MD  senna (SENOKOT) 8.6 MG TABS Take 1-3 tablets by mouth daily as needed (constipation). "Swiss Kriss Tabs"-active ingredient=Senna   Yes Historical Provider, MD   Physical Exam: Filed Vitals:   11/13/12 2305 11/14/12 0333 11/14/12 0334  BP: 126/67 110/62   Pulse:   70  Temp: 97.6 F (36.4 C)    TempSrc: Oral    Resp: 15  13  SpO2: 100%  96%    General:  NAD, resting comfortably in bed Eyes: PEERLA EOMI ENT: mucous membranes moist Neck: supple w/o JVD Cardiovascular: RRR w/o  MRG Respiratory: CTA B Abdomen: soft, nt, nd, bs+, urostomy bag in place Skin: no rash nor lesion Musculoskeletal: MAE, 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 Psychiatric: normal tone and affect Neurologic: AAOx3, stuttering speech  Labs on Admission:  Basic Metabolic Panel:  Recent Labs Lab 11/13/12 2310 11/13/12 2332  NA 131* 139  K 4.5 4.7  CL 111 122*  CO2 10*  --   GLUCOSE 184* 185*  BUN 43* 44*  CREATININE 1.62* 1.50*  CALCIUM 11.1*  --    Liver Function Tests:  Recent Labs Lab 11/13/12 2310  AST 17  ALT 23  ALKPHOS 111  BILITOT 0.2*  PROT 6.6  ALBUMIN 2.8*    Recent  Labs Lab 11/13/12 2310  LIPASE 93*   No results found for this basename: AMMONIA,  in the last 168 hours CBC:  Recent Labs Lab 11/13/12 2310 11/13/12 2332  WBC 8.9  --   HGB 9.1* 9.9*  HCT 28.7* 29.0*  MCV 82.0  --   PLT 297  --    Cardiac Enzymes: No results found for this basename: CKTOTAL, CKMB, CKMBINDEX, TROPONINI,  in the last 168 hours  BNP (last 3 results) No results found for this basename: PROBNP,  in the last 8760 hours CBG:  Recent Labs Lab 11/13/12 2302  GLUCAP 181*    Radiological Exams on Admission: Dg Chest 2 View  11/14/2012  *RADIOLOGY REPORT*  Clinical Data: Weakness after falling.  History of hypertension, heart attack, bladder cancer.  CHEST - 2 VIEW  Comparison: 06/10/2011  Findings: Heart size is within normal limits accounting for the AP position.  Markings at the right lung base are slightly prominent but appear chronic.  There are no new focal consolidations or pleural effusions.  Old rib fractures are noted.  IMPRESSION:  1.  Stable right lower lobe densities. 2.  No evidence for acute cardiopulmonary disease.   Original Report Authenticated By: Norva Pavlov, M.D.    Dg Lumbar Spine Complete  11/14/2012  *RADIOLOGY REPORT*  Clinical Data: Low back pain after fall.  LUMBAR SPINE - COMPLETE 4+ VIEW  Comparison: None.  Findings: Five lumbar type vertebrae.  Diffuse  bone demineralization.  Normal alignment of the lumbar vertebrae and facet joints.  Degenerative changes throughout the lumbar spine with narrowed lumbar interspaces and anterior osteophytes. Bridging osteophytes in the lower thoracic and upper lumbar spine. No vertebral compression deformities.  No focal bone lesion or bone destruction.  Bone cortex and trabecular architecture appear intact.  Vascular calcifications.  IMPRESSION: Degenerative changes in the lumbar spine.  No displaced fractures identified.   Original Report Authenticated By: Burman Nieves, M.D.    Ct Head Wo Contrast  11/14/2012  *RADIOLOGY REPORT*  Clinical Data: Increasingly weak.  Unable to transfer from bed to wheel chair.  Decreased appetite.  Fell this morning.  CT HEAD WITHOUT CONTRAST  Technique:  Contiguous axial images were obtained from the base of the skull through the vertex without contrast.  Comparison: None.  Findings: There is encephalomalacia in the right temporal lobe, consistent with old infarct.  There is central cortical atrophy. Periventricular white matter changes are consistent with small vessel disease. There is no evidence for hemorrhage, mass lesion, or acute infarction.  No calvarial fracture.  Note is made of atherosclerotic calcification of the internal carotid arteries.  IMPRESSION:  1.  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 2.  Atrophy and small vessel disease. 3. No evidence for acute intracranial abnormality.   Original Report Authenticated By: Norva Pavlov, M.D.     EKG: Independently reviewed.  Assessment/Plan Principal Problem:   Metabolic acidosis, normal anion gap (NAG) Active Problems:   DM2 (diabetes mellitus, type 2)   HTN (hypertension)   1. 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 Briant Cedar  who agreed with the assessment and treatment via use of bicarb. 2. AMS - most likely this is due to #1, alternatively this could be due to a number of issues including but not limited to UTI and Stroke.  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 3. DM2 - will put patient on lantus 40 BID (this is down from her total daily dose she takes at home of 110), will put her on a high dose SSI, CBG checks AC/HS 4. HTN - continue home meds.    Code Status: Full (must indicate code status--if unknown or must be presumed, indicate so) Family Communication: Spoke with son at bedside (indicate person spoken with, if applicable, with phone number if by telephone) Disposition Plan: Admit to inpatient (indicate anticipated LOS)  Time spent: 70 min  Jovanny Stephanie M. [facility] Pager [phone]  If 7PM-7AM, please contact night-coverage [email or web address] Password [facility] 11/14/2012, 4:45 AM

## 2012-11-14 NOTE — ED Notes (Signed)
RT at bedside.

## 2012-11-14 NOTE — Progress Notes (Signed)
[facility] PROGRESS NOTE  CHEYAN FREES NWG:[id number] DOB: 08-26-1940 DOA: 11/13/2012 PCP: Gaye Alken, MD  Assessment/Plan:   1. 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 Briant Cedar who agreed with the assessment and treatment via use of bicarb. 1. AMS - most likely this is due to #1, alternatively this could be due to a number of issues including but not limited to UTI and Stroke. 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 2. DM2 - will put patient on lantus 40 BID (this is down from her total daily dose she takes at home of 110), will put her on a high dose SSI, CBG checks AC/HS 3. HTN - continue home meds.   Antibiotics:  Rocephin 4/5  HPI/Subjective: Comfortable.   Objective: Filed Vitals:   11/14/12 0333 11/14/12 0334 11/14/12 0521 11/14/12 0631  BP: 110/62  112/49 118/50  Pulse:  70 72 73  Temp:    98.2 F (36.8 C)  TempSrc:    Oral  Resp:  13 16 16   Height:    5\' 3"  (1.6 m)  Weight:    92.171 kg (203 lb 3.2 oz)  SpO2:  96% 100% 100%    Intake/Output Summary (Last 24 hours) at 11/14/12 1105 Last data filed at 11/14/12 0700  Gross per 24 hour  Intake    385 ml  Output    400 ml  Net    -15 ml   Filed Weights   11/14/12 0631  Weight: 92.171 kg (203 lb 3.2 oz)    Exam: Cardiovascular: RRR w/o MRG  Respiratory: CTA B  Abdomen: soft, nt, nd, bs+, urostomy bag in place  Skin: no rash nor lesion  Musculoskeletal: MAE, 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  Psychiatric: normal tone and affect  Neurologic: AAOx3, stuttering  speech   Data Reviewed: Basic Metabolic Panel:  Recent Labs Lab 11/13/12 2310 11/13/12 2332 11/14/12 0455  NA 131* 139 133*  K 4.5 4.7 4.7  CL 111 122* 113*  CO2 10*  --  10*  GLUCOSE 184* 185* 115*  BUN 43* 44* 42*  CREATININE 1.62* 1.50* 1.52*  CALCIUM 11.1*  --  10.8*   Liver Function Tests:  Recent Labs Lab 11/13/12 2310  AST 17  ALT 23  ALKPHOS 111  BILITOT 0.2*  PROT 6.6  ALBUMIN 2.8*    Recent Labs Lab 11/13/12 2310  LIPASE 93*   No results found for this basename: AMMONIA,  in the last 168 hours CBC:  Recent Labs Lab 11/13/12 2310 11/13/12 2332 11/14/12 0455  WBC 8.9  --  9.1  HGB 9.1* 9.9* 8.8*  HCT 28.7* 29.0* 27.1*  MCV 82.0  --  80.9  PLT 297  --  260   Cardiac Enzymes: No results found for this basename: CKTOTAL, CKMB, CKMBINDEX, TROPONINI,  in the last 168 hours BNP (last 3 results) No results found for this basename: PROBNP,  in the last 8760 hours CBG:  Recent Labs Lab 11/13/12 2302 11/14/12 0757  GLUCAP 181* 109*    No results found for this or any previous  visit (from the past 240 hour(s)).   Studies: Dg Chest 2 View  11/14/2012  *RADIOLOGY REPORT*  Clinical Data: Weakness after falling.  History of hypertension, heart attack, bladder cancer.  CHEST - 2 VIEW  Comparison: 06/10/2011  Findings: Heart size is within normal limits accounting for the AP position.  Markings at the right lung base are slightly prominent but appear chronic.  There are no new focal consolidations or pleural effusions.  Old rib fractures are noted.  IMPRESSION:  1.  Stable right lower lobe densities. 2.  No evidence for acute cardiopulmonary disease.   Original Report Authenticated By: Norva Pavlov, M.D.    Dg Lumbar Spine Complete  11/14/2012  *RADIOLOGY REPORT*  Clinical Data: Low back pain after fall.  LUMBAR SPINE - COMPLETE 4+ VIEW  Comparison: None.  Findings: Five lumbar type vertebrae.  Diffuse bone demineralization.  Normal alignment of the  lumbar vertebrae and facet joints.  Degenerative changes throughout the lumbar spine with narrowed lumbar interspaces and anterior osteophytes. Bridging osteophytes in the lower thoracic and upper lumbar spine. No vertebral compression deformities.  No focal bone lesion or bone destruction.  Bone cortex and trabecular architecture appear intact.  Vascular calcifications.  IMPRESSION: Degenerative changes in the lumbar spine.  No displaced fractures identified.   Original Report Authenticated By: Burman Nieves, M.D.    Ct Head Wo Contrast  11/14/2012  *RADIOLOGY REPORT*  Clinical Data: Increasingly weak.  Unable to transfer from bed to wheel chair.  Decreased appetite.  Fell this morning.  CT HEAD WITHOUT CONTRAST  Technique:  Contiguous axial images were obtained from the base of the skull through the vertex without contrast.  Comparison: None.  Findings: There is encephalomalacia in the right temporal lobe, consistent with old infarct.  There is central cortical atrophy. Periventricular white matter changes are consistent with small vessel disease. There is no evidence for hemorrhage, mass lesion, or acute infarction.  No calvarial fracture.  Note is made of atherosclerotic calcification of the internal carotid arteries.  IMPRESSION:  1.  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 2.  Atrophy and small vessel disease. 3. No evidence for acute intracranial abnormality.   Original Report Authenticated By: Norva Pavlov, M.D.     Scheduled Meds: . aspirin  81 mg Oral q morning - 10a  . atorvastatin  80 mg Oral QPM  . cefTRIAXone (ROCEPHIN)  IV  1 g Intravenous Q24H  . celecoxib  100 mg Oral QPM  . cholecalciferol  5,000 Units Oral q morning - 10a  . clopidogrel  75 mg Oral Daily  . docusate sodium  100 mg Oral BID  . heparin  5,000 Units Subcutaneous Q8H  . insulin aspart  0-20 Units Subcutaneous TID WC  . insulin glargine  40 Units Subcutaneous BID  . lisinopril  5 mg Oral q morning - 10a  .  metoprolol succinate  100 mg Oral QPM  . omega-3 acid ethyl esters  1 g Oral QPM  . pantoprazole  40 mg Oral Daily  . sodium chloride  3 mL Intravenous Q12H   Continuous Infusions: .  sodium bicarbonate 150 mEq in sterile water 1000 mL infusion 75 mL/hr at 11/14/12 0443    Principal Problem:   Metabolic acidosis, normal anion gap (NAG) Active Problems:   DM2 (diabetes mellitus, type 2)   HTN (hypertension)        Charlina Dwight  [facility] Pager [phone]. If 7PM-7AM, please contact night-coverage at [email or web address], password [facility] 11/14/2012, 11:05 AM  LOS: 1 day

## 2012-11-15 LAB — GLUCOSE, CAPILLARY
Glucose-Capillary: 115 mg/dL — ABNORMAL HIGH (ref 70–99)
Glucose-Capillary: 190 mg/dL — ABNORMAL HIGH (ref 70–99)
Glucose-Capillary: 142 mg/dL — ABNORMAL HIGH (ref 70–99)

## 2012-11-15 LAB — URINE CULTURE

## 2012-11-15 NOTE — Progress Notes (Signed)
[facility] PROGRESS NOTE  [name]:[id number] DOB: [birth year] DOA: 11/13/2012 PCP: Gaye Alken, MD  Assessment/Plan:   1. 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 Briant Cedar who agreed with the assessment and treatment via use of bicarb. 1. AMS - most likely this is due to #1, alternatively this could be due to a number of issues including but not limited to UTI and Stroke. Will keep the patient on rocephin empirically for UTI . Her urine cultures show mutiple bacterial morphotypes, will repeat urine cultures and continue with rocephin empirically.  2. DM2 - will put patient on lantus 40 BID (this is down from her total daily dose she takes at home of 110), will put her on a high dose SSI, CBG checks AC/HS 3. HTN - continue home meds. 4. DVT prophylaxis.   Antibiotics:  Rocephin 4/5  HPI/Subjective: Comfortable.   Objective: Filed Vitals:   11/14/12 1500 11/14/12 2112 11/15/12 0541 11/15/12 1426  BP: 96/38 107/34 101/51 101/40  Pulse:  72 88 96  Temp:  98.4 F (36.9 C) 98.4 F (36.9 C) 99.2 F (37.3 C)  TempSrc:  Oral Oral Oral  Resp:  18 16 18   Height:      Weight:      SpO2:  99% 99% 97%    Intake/Output Summary (Last 24 hours) at 11/15/12 1908 Last data filed at 11/15/12 1800  Gross per 24 hour  Intake   3245 ml  Output   2376 ml  Net    869 ml   Filed Weights   11/14/12 0631  Weight: 92.171 kg (203 lb 3.2 oz)    Exam: Cardiovascular: RRR w/o MRG  Respiratory: CTA B  Abdomen: soft, nt, nd, bs+, urostomy bag in place  Skin: no rash nor lesion  Musculoskeletal: MAE, 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  Psychiatric: normal tone and affect  Neurologic: AAOx3, stuttering speech   Data Reviewed: Basic Metabolic  Panel:  Recent Labs Lab 11/13/12 2310 11/13/12 2332 11/14/12 0455 11/14/12 2009  NA 131* 139 133* 133*  K 4.5 4.7 4.7 4.7  CL 111 122* 113* 110  CO2 10*  --  10* 13*  GLUCOSE 184* 185* 115* 205*  BUN 43* 44* 42* 41*  CREATININE 1.62* 1.50* 1.52* 1.76*  CALCIUM 11.1*  --  10.8* 10.5   Liver Function Tests:  Recent Labs Lab 11/13/12 2310  AST 17  ALT 23  ALKPHOS 111  BILITOT 0.2*  PROT 6.6  ALBUMIN 2.8*    Recent Labs Lab 11/13/12 2310  LIPASE 93*   No results found for this basename: AMMONIA,  in the last 168 hours CBC:  Recent Labs Lab 11/13/12 2310 11/13/12 2332 11/14/12 0455  WBC 8.9  --  9.1  HGB 9.1* 9.9* 8.8*  HCT 28.7* 29.0* 27.1*  MCV 82.0  --  80.9  PLT 297  --  260   Cardiac Enzymes: No results found for this basename: CKTOTAL, CKMB, CKMBINDEX, TROPONINI,  in the last 168 hours BNP (last 3 results) No results found for this basename: PROBNP,  in the last 8760 hours CBG:  Recent Labs Lab 11/14/12 1708 11/14/12 2245 11/15/12 0740 11/15/12 1157 11/15/12 1745  GLUCAP 115* 190* 81 142* 226*    Recent Results (from the past 240  hour(s))  URINE CULTURE     Status: None   Collection Time    11/13/12 11:00 PM      Result Value Range Status   Specimen Description URINE, CATHETERIZED   Final   Special Requests NONE   Final   Culture  Setup Time 11/14/2012 05:43   Final   Colony Count >=100,000 COLONIES/ML   Final   Culture     Final   Value: Multiple bacterial morphotypes present, none predominant. Suggest appropriate recollection if clinically indicated.   Report Status 11/15/2012 FINAL   Final     Studies: Dg Chest 2 View  11/14/2012  *RADIOLOGY REPORT*  Clinical Data: Weakness after falling.  History of hypertension, heart attack, bladder cancer.  CHEST - 2 VIEW  Comparison: 06/10/2011  Findings: Heart size is within normal limits accounting for the AP position.  Markings at the right lung base are slightly prominent but appear chronic.   There are no new focal consolidations or pleural effusions.  Old rib fractures are noted.  IMPRESSION:  1.  Stable right lower lobe densities. 2.  No evidence for acute cardiopulmonary disease.   Original Report Authenticated By: Norva Pavlov, M.D.    Dg Lumbar Spine Complete  11/14/2012  *RADIOLOGY REPORT*  Clinical Data: Low back pain after fall.  LUMBAR SPINE - COMPLETE 4+ VIEW  Comparison: None.  Findings: Five lumbar type vertebrae.  Diffuse bone demineralization.  Normal alignment of the lumbar vertebrae and facet joints.  Degenerative changes throughout the lumbar spine with narrowed lumbar interspaces and anterior osteophytes. Bridging osteophytes in the lower thoracic and upper lumbar spine. No vertebral compression deformities.  No focal bone lesion or bone destruction.  Bone cortex and trabecular architecture appear intact.  Vascular calcifications.  IMPRESSION: Degenerative changes in the lumbar spine.  No displaced fractures identified.   Original Report Authenticated By: Burman Nieves, M.D.    Ct Head Wo Contrast  11/14/2012  *RADIOLOGY REPORT*  Clinical Data: Increasingly weak.  Unable to transfer from bed to wheel chair.  Decreased appetite.  Fell this morning.  CT HEAD WITHOUT CONTRAST  Technique:  Contiguous axial images were obtained from the base of the skull through the vertex without contrast.  Comparison: None.  Findings: There is encephalomalacia in the right temporal lobe, consistent with old infarct.  There is central cortical atrophy. Periventricular white matter changes are consistent with small vessel disease. There is no evidence for hemorrhage, mass lesion, or acute infarction.  No calvarial fracture.  Note is made of atherosclerotic calcification of the internal carotid arteries.  IMPRESSION:  1.  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 2.  Atrophy and small vessel disease. 3. No evidence for acute intracranial abnormality.   Original Report Authenticated By:  Norva Pavlov, M.D.     Scheduled Meds: . aspirin  81 mg Oral q morning - 10a  . atorvastatin  80 mg Oral QPM  . cefTRIAXone (ROCEPHIN)  IV  1 g Intravenous Q24H  . cholecalciferol  5,000 Units Oral q morning - 10a  . clopidogrel  75 mg Oral Daily  . docusate sodium  100 mg Oral BID  . heparin  5,000 Units Subcutaneous Q8H  . insulin aspart  0-20 Units Subcutaneous TID WC  . insulin glargine  40 Units Subcutaneous BID  . metoprolol succinate  100 mg Oral QPM  . omega-3 acid ethyl esters  1 g Oral QPM  . pantoprazole  40 mg Oral Daily  . sodium chloride  3  mL Intravenous Q12H   Continuous Infusions: .  sodium bicarbonate 150 mEq in sterile water 1000 mL infusion 75 mL/hr at 11/15/12 1319    Principal Problem:   Metabolic acidosis, normal anion gap (NAG) Active Problems:   DM2 (diabetes mellitus, type 2)   HTN (hypertension)        [name]  [facility] Pager [phone]. If 7PM-7AM, please contact night-coverage at [email or web address], password [facility] 11/15/2012, 7:08 PM  LOS: 2 days

## 2012-11-16 ENCOUNTER — Inpatient Hospital Stay (HOSPITAL_COMMUNITY): Payer: Medicare Other

## 2012-11-16 LAB — BASIC METABOLIC PANEL
BUN: 35 mg/dL — ABNORMAL HIGH (ref 6–23)
GFR calc non Af Amer: 42 mL/min — ABNORMAL LOW (ref >90)
Glucose, Bld: 164 mg/dL — ABNORMAL HIGH (ref 70–99)
Potassium: 3.8 mEq/L (ref 3.5–5.1)

## 2012-11-16 LAB — GLUCOSE, CAPILLARY
Glucose-Capillary: 117 mg/dL — ABNORMAL HIGH (ref 70–99)
Glucose-Capillary: 273 mg/dL — ABNORMAL HIGH (ref 70–99)

## 2012-11-16 MED ORDER — INSULIN ASPART 100 UNIT/ML ~~LOC~~ SOLN
3.0000 [IU] | Freq: Once | SUBCUTANEOUS | Status: AC
  Administered 2012-11-16: 3 [IU] via SUBCUTANEOUS

## 2012-11-16 MED ORDER — INSULIN ASPART 100 UNIT/ML ~~LOC~~ SOLN
3.0000 [IU] | Freq: Three times a day (TID) | SUBCUTANEOUS | Status: DC
  Administered 2012-11-16 – 2012-11-18 (×6): 3 [IU] via SUBCUTANEOUS

## 2012-11-16 NOTE — Progress Notes (Signed)
[facility] PROGRESS NOTE  [name] MWN:[id number] DOB: 09-03-1940 DOA: 11/13/2012 PCP: Gaye Alken, MD  Assessment/Plan:   1. 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 Briant Cedar who agreed with the assessment and treatment via use of bicarb. 1. AMS - most likely this is due to #1,  Resolved. alternatively this could be due to a number of issues including but not limited to UTI and Stroke. Will keep the patient on rocephin empirically for UTI . Her urine cultures show mutiple bacterial morphotypes, will repeat urine cultures and continue with rocephin empirically.  2. DM2 - will put patient on lantus 40 BID (this is down from her total daily dose she takes at home of 110), will put her on a high dose SSI, CBG checks AC/HS 3. HTN - continue home meds. 4. DVT prophylaxis.  5. Right hip pain: she reports since the catheterization in march, her right thigh and hip has bothered her. Will get hip x rays and PT Evaluation.  Antibiotics:  Rocephin 4/5  HPI/Subjective: Comfortable.   Objective: Filed Vitals:   11/15/12 1426 11/15/12 2200 11/16/12 0456 11/16/12 1300  BP: 101/40 128/41 139/49 143/45  Pulse: 96 80 93 108  Temp: 99.2 F (37.3 C) 97.8 F (36.6 C) 99 F (37.2 C) 98.5 F (36.9 C)  TempSrc: Oral Oral Oral Oral  Resp: 18 20 18 18   Height:      Weight:      SpO2: 97% 98% 98% 97%    Intake/Output Summary (Last 24 hours) at 11/16/12 1605 Last data filed at 11/16/12 1500  Gross per 24 hour  Intake   2703 ml  Output   2877 ml  Net   -174 ml   Filed Weights   11/14/12 0631  Weight: 92.171 kg (203 lb 3.2 oz)    Exam: Cardiovascular: RRR w/o MRG  Respiratory: CTA B  Abdomen: soft, nt, nd, bs+, urostomy bag in place  Skin: no  rash nor lesion  Musculoskeletal: MAE, 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  Psychiatric: normal tone and affect  Neurologic: AAOx3, stuttering speech   Data Reviewed: Basic Metabolic Panel:  Recent Labs Lab 11/13/12 2310 11/13/12 2332 11/14/12 0455 11/14/12 2009 11/16/12 0445  NA 131* 139 133* 133* 136  K 4.5 4.7 4.7 4.7 3.8  CL 111 122* 113* 110 107  CO2 10*  --  10* 13* 21  GLUCOSE 184* 185* 115* 205* 164*  BUN 43* 44* 42* 41* 35*  CREATININE 1.62* 1.50* 1.52* 1.76* 1.26*  CALCIUM 11.1*  --  10.8* 10.5 10.1   Liver Function Tests:  Recent Labs Lab 11/13/12 2310  AST 17  ALT 23  ALKPHOS 111  BILITOT 0.2*  PROT 6.6  ALBUMIN 2.8*    Recent Labs Lab 11/13/12 2310  LIPASE 93*   No results found for this basename: AMMONIA,  in the last 168 hours CBC:  Recent Labs Lab 11/13/12 2310 11/13/12 2332 11/14/12 0455  WBC 8.9  --  9.1  HGB 9.1* 9.9* 8.8*  HCT 28.7* 29.0* 27.1*  MCV 82.0  --  80.9  PLT 297  --  260   Cardiac Enzymes: No results found for this basename: CKTOTAL, CKMB, CKMBINDEX, TROPONINI,  in the last 168 hours BNP (last 3 results) No results found  for this basename: PROBNP,  in the last 8760 hours CBG:  Recent Labs Lab 11/15/12 1157 11/15/12 1745 11/15/12 2335 11/16/12 0742 11/16/12 1139  GLUCAP 142* 226* 272* 117* 195*    Recent Results (from the past 240 hour(s))  URINE CULTURE     Status: None   Collection Time    11/13/12 11:00 PM      Result Value Range Status   Specimen Description URINE, CATHETERIZED   Final   Special Requests NONE   Final   Culture  Setup Time 11/14/2012 05:43   Final   Colony Count >=100,000 COLONIES/ML   Final   Culture     Final   Value: Multiple bacterial morphotypes present, none predominant. Suggest appropriate recollection if clinically indicated.   Report Status 11/15/2012 FINAL   Final     Studies: No results found.  Scheduled Meds: . aspirin  81 mg Oral q morning - 10a  . atorvastatin  80 mg Oral  QPM  . cefTRIAXone (ROCEPHIN)  IV  1 g Intravenous Q24H  . cholecalciferol  5,000 Units Oral q morning - 10a  . clopidogrel  75 mg Oral Daily  . docusate sodium  100 mg Oral BID  . heparin  5,000 Units Subcutaneous Q8H  . insulin aspart  0-20 Units Subcutaneous TID WC  . insulin glargine  40 Units Subcutaneous BID  . metoprolol succinate  100 mg Oral QPM  . omega-3 acid ethyl esters  1 g Oral QPM  . pantoprazole  40 mg Oral Daily  . sodium chloride  3 mL Intravenous Q12H   Continuous Infusions:    Principal Problem:   Metabolic acidosis, normal anion gap (NAG) Active Problems:   DM2 (diabetes mellitus, type 2)   HTN (hypertension)        [name]  [facility] Pager [phone]. If 7PM-7AM, please contact night-coverage at [email or web address], password [facility] 11/16/2012, 4:05 PM  LOS: 3 days

## 2012-11-16 NOTE — Progress Notes (Signed)
Inpatient Diabetes Program Recommendations  AACE/ADA: New Consensus Statement on Inpatient Glycemic Control (2013)  Target Ranges:  Prepandial:   less than 140 mg/dL      Peak postprandial:   less than 180 mg/dL (1-2 hours)      Critically ill patients:  140 - 180 mg/dL     Results for LOURENE, HOSTON (MRN [id number]) as of 11/16/2012 12:26  Ref. Range 11/15/2012 07:40 11/15/2012 11:57 11/15/2012 17:45 11/15/2012 23:35  Glucose-Capillary Latest Range: 70-99 mg/dL 81 130 (H) 865 (H) 784 (H)    Results for KRISHAUNA, SCHATZMAN (MRN [id number]) as of 11/16/2012 12:26  Ref. Range 11/16/2012 07:42 11/16/2012 11:39  Glucose-Capillary Latest Range: 70-99 mg/dL 132 (H) 440 (H)   Patient having elevated postprandial glucose levels.  Currently getting Lantus 40 units bid + Novolog Resistant correction scale (SSI)  MD- Please consider adding low dose meal coverage to current in-hospital regimen Novolog 3 units tid with meals   Will follow. Ambrose Finland RN, MSN, CDE Diabetes Coordinator Inpatient Diabetes Program [phone]

## 2012-11-17 LAB — BASIC METABOLIC PANEL
BUN: 25 mg/dL — ABNORMAL HIGH (ref 6–23)
Calcium: 10.4 mg/dL (ref 8.4–10.5)
GFR calc non Af Amer: 52 mL/min — ABNORMAL LOW (ref >90)
Glucose, Bld: 183 mg/dL — ABNORMAL HIGH (ref 70–99)
Potassium: 3.8 mEq/L (ref 3.5–5.1)

## 2012-11-17 LAB — URINE CULTURE: Colony Count: >=100000

## 2012-11-17 LAB — GLUCOSE, CAPILLARY: Glucose-Capillary: 162 mg/dL — ABNORMAL HIGH (ref 70–99)

## 2012-11-17 NOTE — Care Management Note (Addendum)
    Page 1 of 2   11/18/2012     1:18:56 PM   CARE MANAGEMENT NOTE 11/18/2012  Patient:  [name], [name]   Account Number:  [id number]  Date Initiated:  11/14/2012  Documentation initiated by:  Lanier Clam  Subjective/Objective Assessment:   ADMITTED W/AMS.R BKA.     Action/Plan:   FROM HOME.USED [facility] IN PAST.   Anticipated DC Date:  11/18/2012   Anticipated DC Plan:  HOME W HOME HEALTH SERVICES      DC Planning Services  CM consult      Choice offered to / List presented to:  C-1 Patient        HH arranged  HH-1 RN  HH-2 PT      [facility] agency  [facility]   Status of service:  Completed, signed off Medicare Important Message given?   (If response is "NO", the following Medicare IM given date fields will be blank) Date Medicare IM given:   Date Additional Medicare IM given:    Discharge Disposition:  HOME W HOME HEALTH SERVICES  Per UR Regulation:  Reviewed for med. necessity/level of care/duration of stay  If discussed at Long Length of Stay Meetings, dates discussed:    Comments:  11/18/12 Vonn Sliger RN,BSN NCM [phone] [facility] AWARE OF HH ORDERS,& D/C. 11/17/12 Daryan Buell RN,BSN NCM [phone] RECEIVED CALL FROM [facility]-THEY ARE UNABLE TO SERVICE PATIENT.PATIENT MADE AWARE,& SHE CHOSE [facility](LEFT VM) FOR REFERRAL.HHRN/PT,FAXED HH ORDERS TO [phone] WITH CONFIRMATION. PATIENT AGREE TO [facility] IF NEEDED.PT-HH.FAXED FACE SHEET,H&P,PT EVAL,HHRNPT ORDER TO ZOX#[phone] W/CONFIRMATION.

## 2012-11-17 NOTE — Evaluation (Signed)
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   Pt would benefit from acute PT services in order to improve independence with mobility to prepare for d/c home.  Pt reports her daughter is having hip surgery at [facility] tomorrow and will not be likely to assist upon d/c.  Pt reports numbness and light touch absent to anterior thigh and groin since catheterization in March, which she reports has made her use assistive device due to "something doesn't feel right" in her R LE.  Pt unable to describe into further detail.      PT Assessment  Patient needs continued PT services    Follow Up Recommendations  Home health PT    Does the patient have the potential to tolerate intense rehabilitation      Barriers to Discharge        Equipment Recommendations  None recommended by PT    Recommendations for Other Services     Frequency Min 3X/week    Precautions / Restrictions Precautions Precautions: Fall Precaution Comments: R BKA with prosthesis, ileal conduit Restrictions Weight Bearing Restrictions: No   Pertinent Vitals/Pain n/a      Mobility  Bed Mobility Bed Mobility: Supine to Sit Supine to Sit: 6: Modified independent (Device/Increase time) Transfers Transfers: Sit to Stand;Stand to Sit Sit to Stand: 4: Min assist;With upper extremity assist;From chair/3-in-1;From bed Stand to Sit: 4: Min assist;With upper extremity assist;To chair/3-in-1 Details for Transfer Assistance: assist for sit to stands with seated rest break during ambulation due to nausea otherwise min/guard Ambulation/Gait Ambulation/Gait Assistance: 4: Min guard Ambulation Distance (Feet): 40 Feet Assistive  device: Rolling walker Ambulation/Gait Assistance Details: pt reports more confidence with RW, also attempted only using hand rail however pt reports something in R LE "just doesn't feel right" but unable to be more descriptive, pt became nauseated so chair brought to pt Gait Pattern: Step-through pattern;Decreased stride length;Trunk flexed General Gait Details: verbal cues for use of RW    Exercises     PT Diagnosis: Difficulty walking  PT Problem List: Decreased activity tolerance;Decreased mobility;Decreased balance;Decreased knowledge of use of DME PT Treatment Interventions: DME instruction;Gait training;Functional mobility training;Therapeutic activities;Patient/family education   PT Goals Acute Rehab PT Goals PT Goal Formulation: With patient Time For Goal Achievement: 11/24/12 Potential to Achieve Goals: Good Pt will go Sit to Stand: with modified independence PT Goal: Sit to Stand - Progress: Goal set today Pt will go Stand to Sit: with modified independence PT Goal: Stand to Sit - Progress: Goal set today Pt will Ambulate: 51 - 150 feet;with modified independence;with least restrictive assistive device PT Goal: Ambulate - Progress: Goal set today Pt will Perform Home Exercise Program: with supervision, verbal cues required/provided PT Goal: Perform Home Exercise Program - Progress: Goal set today  Visit Information  Last PT Received On: 11/17/12 Assistance Needed: +1    Subjective Data  Subjective: I feel like my prosthetic needs to turn out more. Patient Stated Goal: return to ambulation without assistive device   Prior Functioning  Home Living Lives With: Daughter Type of Home: House Home Access: Stairs to enter Entrance Stairs-Number of Steps: 1 Home Layout: One level Home Adaptive Equipment: Walker - rolling;Straight cane;Bedside commode/3-in-1;Shower chair with  back Additional Comments: daughter is going into hip surgery tomorrow at [facility] and will be unable to  assist Prior Function Level of Independence: Independent with assistive device(s) Comments: bird bathes as pt reports fearful of falling in shower, pt states she normally does not use assistive device only prosthesis up until catheter for stent placement after "heart attack" approx one month ago Communication Communication: No difficulties    Cognition  Cognition Overall Cognitive Status: Appears within functional limits for tasks assessed/performed Arousal/Alertness: Awake/alert Orientation Level: Appears intact for tasks assessed Behavior During Session: [facility] for tasks performed    Extremity/Trunk Assessment Right Upper Extremity Assessment RUE ROM/Strength/Tone: [facility] for tasks assessed Left Upper Extremity Assessment LUE ROM/Strength/Tone: WFL for tasks assessed Right Lower Extremity Assessment RLE ROM/Strength/Tone: [facility] for tasks assessed (R BKA, no pain with hip active ROM) RLE Sensation: Deficits;History of peripheral neuropathy RLE Sensation Deficits: pt reports decreased light touch sensation to anterior thigh and groin since catheterization in March, able to feel light touch to lateral thigh Left Lower Extremity Assessment LLE ROM/Strength/Tone: [facility] for tasks assessed LLE Sensation: History of peripheral neuropathy   Balance    End of Session PT - End of Session Equipment Utilized During Treatment: Right lower extremity prosthesis Activity Tolerance: Other (comment) (limited by nausea) Patient left: in chair;with call bell/phone within reach;with nursing in room Nurse Communication: Mobility status;Other (comment) (nausea, numb/no light touch anterior R thigh)  GP     [name],[name] 11/17/2012, 1:02 PM Zenovia Jarred, PT, DPT 11/17/2012 Pager: [phone]

## 2012-11-17 NOTE — Progress Notes (Signed)
[facility] PROGRESS NOTE  [name] AVW:[id number] DOB: [birth year] DOA: 11/13/2012 PCP: Gaye Alken, MD  Assessment/Plan:   1. Non anion gap metabolic acidosis -  Resolved. most likely secondary to ileal conduit, starting bicarb GTT at 75 cc/hr, repeat BMP  showed improvement in the bicarbonate, and bicarb drip was stopped.  ileal conduit is a common and known cause of NAG metabolic acidosis, curb sided Dr. Briant Cedar who agreed with the assessment and treatment via use of bicarb. 2. AMS - most likely this is due to #1,  Resolved. alternatively this could be due to a number of issues including but not limited to UTI and Stroke. Will keep the patient on rocephin empirically for UTI . Her urine cultures show mutiple bacterial morphotypes, repeat urine cultures show enterococcus and sensitivities are pending.  She is currently on rocephin, will add vancomycin today.   3. DM2 - will put patient on lantus 40 BID (this is down from her total daily dose she takes at home of 110), will put her on a high dose SSI, CBG checks AC/HS. CBG (last 3)   Recent Labs  11/16/12 2140 11/17/12 0746 11/17/12 1130  GLUCAP 273* 175* 216*   Added 3 units tidac novolog premeal coverage.   4. HTN - continue home meds. 5. DVT prophylaxis.  6. Right hip pain: possibly from arthritis changes. Hip x rays not significant.  PT EVAL recommended home health PT.   Antibiotics:  Rocephin 4/5  HPI/Subjective: Comfortable.   Objective: Filed Vitals:   11/16/12 1300 11/16/12 2143 11/17/12 0519 11/17/12 1435  BP: 143/45 118/47 132/58 142/79  Pulse: 108 101 74 100  Temp: 98.5 F (36.9 C) 98.7 F (37.1 C) 98.2 F (36.8 C) 97.6 F (36.4 C)  TempSrc: Oral Oral Oral Oral  Resp: 18 18 18    Height:      Weight:      SpO2: 97% 98% 97%     Intake/Output Summary (Last 24 hours) at 11/17/12 1440 Last data filed at 11/17/12 0527  Gross per 24 hour  Intake    293 ml  Output   1400 ml  Net   -1107 ml   Filed Weights   11/14/12 0631  Weight: 92.171 kg (203 lb 3.2 oz)    Exam: Cardiovascular: RRR w/o MRG  Respiratory: CTA B  Abdomen: soft, nt, nd, bs+, urostomy bag in place  Skin: no rash nor lesion  Musculoskeletal: MAE, 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  Psychiatric: normal tone and affect  Neurologic: AAOx3, stuttering speech   Data Reviewed: Basic Metabolic Panel:  Recent Labs Lab 11/13/12 2310 11/13/12 2332 11/14/12 0455 11/14/12 2009 11/16/12 0445 11/17/12 0939  NA 131* 139 133* 133* 136 136  K 4.5 4.7 4.7 4.7 3.8 3.8  CL 111 122* 113* 110 107 105  CO2 10*  --  10* 13* 21 24  GLUCOSE 184* 185* 115* 205* 164* 183*  BUN 43* 44* 42* 41* 35* 25*  CREATININE 1.62* 1.50* 1.52* 1.76* 1.26* 1.05  CALCIUM 11.1*  --  10.8* 10.5 10.1 10.4   Liver Function Tests:  Recent Labs Lab 11/13/12 2310  AST 17  ALT 23  ALKPHOS 111  BILITOT 0.2*  PROT 6.6  ALBUMIN 2.8*    Recent Labs Lab 11/13/12 2310  LIPASE 93*   No results found for this basename: AMMONIA,  in the last 168 hours CBC:  Recent Labs Lab 11/13/12 2310 11/13/12 2332 11/14/12 0455  WBC 8.9  --  9.1  HGB 9.1*  9.9* 8.8*  HCT 28.7* 29.0* 27.1*  MCV 82.0  --  80.9  PLT 297  --  260   Cardiac Enzymes: No results found for this basename: CKTOTAL, CKMB, CKMBINDEX, TROPONINI,  in the last 168 hours BNP (last 3 results) No results found for this basename: PROBNP,  in the last 8760 hours CBG:  Recent Labs Lab 11/16/12 1139 11/16/12 1634 11/16/12 2140 11/17/12 0746 11/17/12 1130  GLUCAP 195* 262* 273* 175* 216*    Recent Results (from the past 240 hour(s))  URINE CULTURE     Status: None   Collection Time    11/13/12 11:00 PM      Result Value Range Status   Specimen Description URINE, CATHETERIZED   Final   Special Requests NONE   Final   Culture  Setup Time 11/14/2012 05:43   Final   Colony Count >=100,000 COLONIES/ML   Final   Culture     Final   Value: Multiple bacterial  morphotypes present, none predominant. Suggest appropriate recollection if clinically indicated.   Report Status 11/15/2012 FINAL   Final  URINE CULTURE     Status: None   Collection Time    11/15/12  1:24 PM      Result Value Range Status   Specimen Description URINE, CATHETERIZED   Final   Special Requests NONE   Final   Culture  Setup Time 11/15/2012 21:15   Final   Colony Count >=100,000 COLONIES/ML   Final   Culture ENTEROCOCCUS SPECIES   Final   Report Status PENDING   Incomplete     Studies: Dg Hip Complete Right  11/16/2012  *RADIOLOGY REPORT*  Clinical Data: Right hip pain.  RIGHT HIP - COMPLETE 2+ VIEW  Comparison: None.  Findings: There is no fracture or dislocation or joint space narrowing.  Minimal osteophyte formation on the right acetabulum. No appreciable joint effusion.  Slight calcifications at the gluteal tendon insertions on the greater trochanter.  IMPRESSION: No acute abnormalities.  Mild degenerative changes as described.   Original Report Authenticated By: Francene Boyers, M.D.     Scheduled Meds: . aspirin  81 mg Oral q morning - 10a  . atorvastatin  80 mg Oral QPM  . cefTRIAXone (ROCEPHIN)  IV  1 g Intravenous Q24H  . cholecalciferol  5,000 Units Oral q morning - 10a  . clopidogrel  75 mg Oral Daily  . docusate sodium  100 mg Oral BID  . heparin  5,000 Units Subcutaneous Q8H  . insulin aspart  0-20 Units Subcutaneous TID WC  . insulin aspart  3 Units Subcutaneous TID WC  . insulin glargine  40 Units Subcutaneous BID  . metoprolol succinate  100 mg Oral QPM  . omega-3 acid ethyl esters  1 g Oral QPM  . pantoprazole  40 mg Oral Daily  . sodium chloride  3 mL Intravenous Q12H   Continuous Infusions:    Principal Problem:   Metabolic acidosis, normal anion gap (NAG) Active Problems:   DM2 (diabetes mellitus, type 2)   HTN (hypertension)        Aleczander Fandino  [facility] Pager [phone]. If 7PM-7AM, please contact night-coverage at  [email or web address], password [facility] 11/17/2012, 2:40 PM  LOS: 4 days

## 2012-11-17 NOTE — Progress Notes (Signed)
Pt. Nauseous after ambulation with therapy. Relieved by resting in chair and sips of ginger ale.

## 2012-11-18 LAB — GLUCOSE, CAPILLARY: Glucose-Capillary: 228 mg/dL — ABNORMAL HIGH (ref 70–99)

## 2012-11-18 MED ORDER — HYDROCODONE-ACETAMINOPHEN 10-325 MG PO TABS: 1.0000 | ORAL_TABLET | Freq: Three times a day (TID) | ORAL | Status: DC | PRN

## 2012-11-18 MED ORDER — AMPICILLIN 500 MG PO CAPS: 500.0000 mg | ORAL_CAPSULE | Freq: Four times a day (QID) | ORAL | Status: DC

## 2012-11-18 MED ORDER — AMPICILLIN 500 MG PO CAPS
500.0000 mg | ORAL_CAPSULE | Freq: Four times a day (QID) | ORAL | Status: DC
  Administered 2012-11-18: 500 mg via ORAL
  Filled 2012-11-18 (×5): qty 1

## 2012-11-18 NOTE — Progress Notes (Signed)
Physical Therapy Treatment Patient Details Name: [name] MRN: [id number] DOB: 12-16-1940 Today's Date: 11/18/2012 Time: 4098-1191 PT Time Calculation (min): 52 min  PT Assessment / Plan / Recommendation Comments on Treatment Session  Pt ambulated with RW and SPC.  Pt hopes to progress to ambulation without assistive device with further PT.  Pt required assist to begin donning suspension for prosthetic.  Pt also correctly provided information on ply sizes to properly fit into prosthetic (due to a little loose yesterday).  Pt and therapist also discussed HH vs outpatient therapy as well as that numb sensation of anterior R thigh since cath in March may or may not go away however pt would need further gait training to improve mobility and confidence as pt's goal to ambulate without assistive device (PLOF).  Recommend HHPT with hopeful quick progression to outpatient. Pt also agreed to continue using RW at home for safety.    Follow Up Recommendations  Home health PT     Does the patient have the potential to tolerate intense rehabilitation     Barriers to Discharge        Equipment Recommendations  None recommended by PT    Recommendations for Other Services    Frequency     Plan Frequency remains appropriate;Discharge plan remains appropriate    Precautions / Restrictions Precautions Precautions: Fall Precaution Comments: R BKA with prosthesis, ileal conduit Restrictions Weight Bearing Restrictions: No   Pertinent Vitals/Pain n/a    Mobility  Bed Mobility Bed Mobility: Supine to Sit Supine to Sit: 6: Modified independent (Device/Increase time) Transfers Transfers: Sit to Stand;Stand to Sit Sit to Stand: With upper extremity assist;From chair/3-in-1;5: Supervision;From bed;From toilet Stand to Sit: With upper extremity assist;To chair/3-in-1;5: Supervision;To toilet Details for Transfer Assistance: supervision for safety Ambulation/Gait Ambulation/Gait Assistance: 4:  Min guard;5: Supervision Ambulation Distance (Feet): 60 Feet (x2) Assistive device: Rolling walker;Straight cane Ambulation/Gait Assistance Details: pt ambulated with RW 60 feet also attempting to use rail with RW a little ahead and walk to RW, then after seated rest break another 60 feet with SPC, verbal cue for correct use of cane, pt reports head feels "funny" but denies lightheadedness or dizziness, pt also required short standing rest break after 30 feet with each device Gait Pattern: Step-through pattern;Decreased stride length;Trunk flexed    Exercises     PT Diagnosis:    PT Problem List:   PT Treatment Interventions:     PT Goals Acute Rehab PT Goals PT Goal: Sit to Stand - Progress: Progressing toward goal PT Goal: Stand to Sit - Progress: Progressing toward goal PT Goal: Ambulate - Progress: Progressing toward goal  Visit Information  Last PT Received On: 11/18/12 Assistance Needed: +1    Subjective Data  Subjective: I'm going home today. Patient Stated Goal: return to ambulation without assistive device   Cognition  Cognition Overall Cognitive Status: Appears within functional limits for tasks assessed/performed Arousal/Alertness: Awake/alert Orientation Level: Appears intact for tasks assessed Behavior During Session: [facility] for tasks performed    Balance     End of Session PT - End of Session Equipment Utilized During Treatment: Right lower extremity prosthesis Activity Tolerance: Patient tolerated treatment well Patient left: in chair;with call bell/phone within reach   GP     Januel Doolan,KATHrine E 11/18/2012, 12:12 PM Zenovia Jarred, PT, DPT 11/18/2012 Pager: [phone]

## 2012-11-18 NOTE — Plan of Care (Signed)
Problem: Phase II Progression Outcomes Goal: Discharge plan established Outcome: Progressing Anticipated discharge is 11/18/12

## 2012-11-18 NOTE — Discharge Summary (Signed)
[facility]                                                                                   [name], is a 72 y.o. female  DOB Feb 27, 1941  MRN [id number].  Admission date:  11/13/2012  Discharge Date:  11/18/2012  Primary MD  Gaye Alken, MD  Admitting Physician  Hillary Bow, DO  Admission Diagnosis  Hypercalcemia [275.42] Metabolic acidosis, normal anion gap (NAG) [276.2] Metabolic acidosis [276.2] HTN (hypertension) [401.9] UTI (lower urinary tract infection) [599.0] Acute encephalopathy [348.30] DM2 (diabetes mellitus, type 2) [250.00]  Discharge Diagnosis     Principal Problem:   Metabolic acidosis, normal anion gap (NAG) Active Problems:   DM2 (diabetes mellitus, type 2)   HTN (hypertension)      Past Medical History  Diagnosis Date  . Hypertension   . Hyperlipidemia   . History of atrial tachycardia CONTROLLED W/ LOPRESSOR  . Peripheral vascular disease S/P RIGHT BKA  . Headache   . Peripheral neuropathy LEGS AND HANDS  . Arthritis   . S/P BKA (below knee amputation) unilateral RIGHT  . GERD (gastroesophageal reflux disease)   . Anemia   . Blood transfusion   . Diabetes mellitus ORAL AND INSULIN MEDS  . Macular degeneration of both eyes   . Glaucoma(365)   . Retinopathy due to secondary diabetes mellitus   . Insomnia   . Constipation   . History of bladder cancer TCC AND CIS  . MI (mitral incompetence)   . Coronary artery disease     Past Surgical History  Procedure Laterality Date  . I & d right below knee amputation wound  06-10-2011  . Cysto / resection bladder bx's  04-01-11  &  11-26-10  . Below knee leg amputation  05-02-2007    RIGHT  . Right foot i & d / removal necrotic bone  JUN  &  AUG 2008  . Right great toe amputation  11-05-2006  . Laparoscopic cholecystectomy  12-10-1999  . Back surgery  1991   &  1971  . Appendectomy  1963  . Cataract extraction w/ intraocular lens  implant, bilateral     . Cystoscopy with biopsy  10/14/2011    Procedure: CYSTOSCOPY WITH BIOPSY;  Surgeon: Garnett Farm, MD;  Location: [facility];  Service: Urology;  Laterality: N/A;  BLADDER BIOPSY  . Coronary stent placement    . Abdominal hysterectomy    . Revision urostomy cutaneous    . Revision urostomy cutaneous       Recommendations for primary care physician for things to follow:   He is followed BMP closely for metabolic acidosis normal anion gap.   Discharge Diagnoses:   Principal Problem:   Metabolic acidosis, normal anion gap (NAG) Active Problems:   DM2 (diabetes mellitus, type 2)   HTN (hypertension)    Discharge Condition: Stable   Diet recommendation: See Discharge Instructions below   Consults Renal over the phone    History of present illness and  Hospital Course:     Kindly see H&P for history of present illness and admission details, please review complete Labs, Consult reports  and Test reports for all details in brief [name], is a 72 y.o. female, patient was admitted for metabolic encephalopathy due to combination of UTI, non-anion gap metabolic acidosis(due to acute renal insufficiency along with recent Ileal Conduit), her mentation is back to baseline with treatment of metabolic acidosis with IV bicarbonate which has been stopped 2 days ago with stable bicarbonate levels now, her UTI has been treated with Rocephin initially and now been transitioned to oral ampicillin according to her sensitivities for 5 more days. Patient has been requested to follow with her primary care physician on a close basis to get a BMP rechecked to ensure bicarbonate levels are stable, she will also follow with nephrologist Dr. Kathe Mariner one time post discharge. If symptomatic acidosis occurs again chronic oral bicarbonate supplementation can be considered.   Patient has right leg amputation in the past and uses a prosthesis in that leg, of note recently she had a  surgery at [facility] for bladder cancer requiring a Ileal conduit, she says that her right hip has been hurting ever since the surgery few weeks ago, is here are stable, I question if she has sustained some musculoskeletal strain, home PT has been ordered for the same along with pain medications.   For her recent surgery for bladder cancer she will follow with the surgeon at [facility] Dr. Joseph Art.   She also has type 2 diabetes mellitus her home regimen will be initiated upon discharge she has been requested to do Accu-Cheks q. a.c. at bedtime.    Today   Subjective:   [name] today has no headache,no chest abdominal pain,no new weakness tingling or numbness, feels much better wants to go home today.    Objective:   Blood pressure 125/44, pulse 85, temperature 98.5 F (36.9 C), temperature source Oral, resp. rate 18, height 5\' 3"  (1.6 m), weight 92.171 kg (203 lb 3.2 oz), SpO2 98.00%.   Intake/Output Summary (Last 24 hours) at 11/18/12 1023 Last data filed at 11/18/12 0700  Gross per 24 hour  Intake    530 ml  Output   1900 ml  Net  -1370 ml    Exam Awake Alert, Oriented *3, No new F.N deficits, Normal affect [place].AT,PERRAL Supple Neck,No JVD, No cervical lymphadenopathy appriciated.  Symmetrical Chest wall movement, Good air movement bilaterally, CTAB RRR,No Gallops,Rubs or new Murmurs, No Parasternal Heave +ve B.Sounds, Abd Soft, Non tender, No organomegaly appriciated, No rebound -guarding or rigidity. Ileal conduit site appears stable No Cyanosis, Clubbing or edema, No new Rash or bruise, R.Leg with prosthesis.  Data Review   Major procedures and Radiology Reports - PLEASE review detailed and final reports for all details in brief -      Dg Chest 2 View  11/14/2012  *RADIOLOGY REPORT*  Clinical Data: Weakness after falling.  History of hypertension, heart attack, bladder cancer.  CHEST - 2 VIEW  Comparison: 06/10/2011  Findings: Heart size is within  normal limits accounting for the AP position.  Markings at the right lung base are slightly prominent but appear chronic.  There are no new focal consolidations or pleural effusions.  Old rib fractures are noted.  IMPRESSION:  1.  Stable right lower lobe densities. 2.  No evidence for acute cardiopulmonary disease.   Original Report Authenticated By: Norva Pavlov, M.D.    Dg Lumbar Spine Complete  11/14/2012  *RADIOLOGY REPORT*  Clinical Data: Low back pain after fall.  LUMBAR SPINE - COMPLETE 4+ VIEW  Comparison: None.  Findings: Five lumbar type vertebrae.  Diffuse bone demineralization.  Normal alignment of the lumbar vertebrae and facet joints.  Degenerative changes throughout the lumbar spine with narrowed lumbar interspaces and anterior osteophytes. Bridging osteophytes in the lower thoracic and upper lumbar spine. No vertebral compression deformities.  No focal bone lesion or bone destruction.  Bone cortex and trabecular architecture appear intact.  Vascular calcifications.  IMPRESSION: Degenerative changes in the lumbar spine.  No displaced fractures identified.   Original Report Authenticated By: Burman Nieves, M.D.    Dg Hip Complete Right  11/16/2012  *RADIOLOGY REPORT*  Clinical Data: Right hip pain.  RIGHT HIP - COMPLETE 2+ VIEW  Comparison: None.  Findings: There is no fracture or dislocation or joint space narrowing.  Minimal osteophyte formation on the right acetabulum. No appreciable joint effusion.  Slight calcifications at the gluteal tendon insertions on the greater trochanter.  IMPRESSION: No acute abnormalities.  Mild degenerative changes as described.   Original Report Authenticated By: Francene Boyers, M.D.    Ct Head Wo Contrast  11/14/2012  *RADIOLOGY REPORT*  Clinical Data: Increasingly weak.  Unable to transfer from bed to wheel chair.  Decreased appetite.  Fell this morning.  CT HEAD WITHOUT CONTRAST  Technique:  Contiguous axial images were obtained from the base of the skull  through the vertex without contrast.  Comparison: None.  Findings: There is encephalomalacia in the right temporal lobe, consistent with old infarct.  There is central cortical atrophy. Periventricular white matter changes are consistent with small vessel disease. There is no evidence for hemorrhage, mass lesion, or acute infarction.  No calvarial fracture.  Note is made of atherosclerotic calcification of the internal carotid arteries.  IMPRESSION:  1.  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 2.  Atrophy and small vessel disease. 3. No evidence for acute intracranial abnormality.   Original Report Authenticated By: Norva Pavlov, M.D.     Micro Results     Recent Results (from the past 240 hour(s))  URINE CULTURE     Status: None   Collection Time    11/13/12 11:00 PM      Result Value Range Status   Specimen Description URINE, CATHETERIZED   Final   Special Requests NONE   Final   Culture  Setup Time 11/14/2012 05:43   Final   Colony Count >=100,000 COLONIES/ML   Final   Culture     Final   Value: Multiple bacterial morphotypes present, none predominant. Suggest appropriate recollection if clinically indicated.   Report Status 11/15/2012 FINAL   Final  URINE CULTURE     Status: None   Collection Time    11/15/12  1:24 PM      Result Value Range Status   Specimen Description URINE, CATHETERIZED   Final   Special Requests NONE   Final   Culture  Setup Time 11/15/2012 21:15   Final   Colony Count >=100,000 COLONIES/ML   Final   Culture ENTEROCOCCUS SPECIES   Final   Report Status 11/17/2012 FINAL   Final   Organism ID, Bacteria ENTEROCOCCUS SPECIES   Final     CBC w Diff: Lab Results  Component Value Date   WBC 9.1 11/14/2012   HGB 8.8* 11/14/2012   HCT 27.1* 11/14/2012   PLT 260 11/14/2012   LYMPHOPCT 15 09/29/2012   MONOPCT 7 09/29/2012   EOSPCT 5 09/29/2012   BASOPCT 0 09/29/2012    CMP: Lab Results  Component Value Date  NA 136 11/17/2012   K 3.8 11/17/2012   CL 105  11/17/2012   CO2 24 11/17/2012   BUN 25* 11/17/2012   CREATININE 1.05 11/17/2012   PROT 6.6 11/13/2012   ALBUMIN 2.8* 11/13/2012   BILITOT 0.2* 11/13/2012   ALKPHOS 111 11/13/2012   AST 17 11/13/2012   ALT 23 11/13/2012  .  No results found for this basename: HGBA1C    CBG (last 3)   Recent Labs  11/17/12 1645 11/17/12 2113 11/18/12 0810  GLUCAP 216* 162* 110*      Discharge Instructions     Follow with Primary MD Gaye Alken, MD in 2 days   Get CBC, BMP, checked 2 days by Primary MD and again as instructed by your Primary MD.   Get Medicines reviewed and adjusted.  Please request your Prim.MD to go over all Hospital Tests and Procedure/Radiological results at the follow up, please get all Hospital records sent to your Prim MD by signing hospital release before you go home.  Activity: As tolerated with Full fall precautions use walker/cane & assistance as needed   Diet:  Healthy low carbohydrate  Accuchecks 4 times/day, Once in AM empty stomach and then before each meal. Log in all results and show them to your Prim.MD in 3 days. If any glucose reading is under 80 or above 300 call your Prim MD immidiately. Follow Low glucose instructions for glucose under 80 as instructed.   For Heart failure patients - Check your Weight same time everyday, if you gain over 2 pounds, or you develop in leg swelling, experience more shortness of breath or chest pain, call your Primary MD immediately. Follow Cardiac Low Salt Diet and 1.8 lit/day fluid restriction.  Disposition Home    If you experience worsening of your admission symptoms, develop shortness of breath, life threatening emergency, suicidal or homicidal thoughts you must seek medical attention immediately by calling 911 or calling your MD immediately  if symptoms less severe.  You Must read complete instructions/literature along with all the possible adverse reactions/side effects for all the Medicines you take and that have  been prescribed to you. Take any new Medicines after you have completely understood and accpet all the possible adverse reactions/side effects.   Do not drive and provide baby sitting services if your were admitted for syncope or siezures until you have seen by Primary MD or a Neurologist and advised to do so again.  Do not drive when taking Pain medications.    Do not take more than prescribed Pain, Sleep and Anxiety Medications  Special Instructions: If you have smoked or chewed Tobacco  in the last 2 yrs please stop smoking, stop any regular Alcohol  and or any Recreational drug use.  Wear Seat belts while driving.    Follow-up Information   Follow up with Gaye Alken, MD. Schedule an appointment as soon as possible for a visit in 4 days. (CBC and BMP check)    Contact information:   [address] [place] [address] [phone]       Follow up with Willette Pa, MD. Schedule an appointment as soon as possible for a visit in 2 weeks.   Contact information:   [facility] [address] [facility] [phone]       Follow up with Donato Schultz, MD. Schedule an appointment as soon as possible for a visit in 1 week.   Contact information:   [address] [place] [address] [phone]  Follow up with Dagoberto Ligas., MD. Schedule an appointment as soon as possible for a visit in 2 weeks. (Acidosis follow up)    Contact information:   [address] [facility] [place] [address] [phone]         Discharge Medications     Medication List    STOP taking these medications       lisinopril 5 MG tablet  Commonly known as:  PRINIVIL,ZESTRIL      TAKE these medications       ampicillin 500 MG capsule  Commonly known as:  PRINCIPEN  Take 1 capsule (500 mg total) by mouth every 6 (six) hours.     aspirin 81 MG chewable tablet  Chew 81 mg by mouth every morning.     atorvastatin 80 MG tablet   Commonly known as:  LIPITOR  Take 80 mg by mouth every evening.     celecoxib 100 MG capsule  Commonly known as:  CELEBREX  Take 100 mg by mouth every evening.     cholecalciferol 1000 UNITS tablet  Commonly known as:  VITAMIN D  Take 5,000 Units by mouth every morning.     clopidogrel 75 MG tablet  Commonly known as:  PLAVIX  Take 75 mg by mouth daily.     docusate sodium 100 MG capsule  Commonly known as:  COLACE  Take 100 mg by mouth 2 (two) times daily.     fish oil-omega-3 fatty acids 1000 MG capsule  Take 1 g by mouth every evening.     HYDROcodone-acetaminophen 10-325 MG per tablet  Commonly known as:  NORCO  Take 1 tablet by mouth every 8 (eight) hours as needed for pain.     insulin glargine 100 UNIT/ML injection  Commonly known as:  LANTUS  Inject 30-80 Units into the skin 2 (two) times daily. 80 units in the morning and 30 units at night     insulin lispro 100 UNIT/ML injection  Commonly known as:  HUMALOG  Inject 20-50 Units into the skin daily as needed for high blood sugar. PER SLIDING SCALE     loratadine 10 MG tablet  Commonly known as:  CLARITIN  Take 10 mg by mouth daily as needed for allergies.     Melatonin 10 MG Tabs  Take 1 tablet by mouth at bedtime.     metoprolol succinate 100 MG 24 hr tablet  Commonly known as:  TOPROL-XL  Take 100 mg by mouth every evening. Take with or immediately following a meal.     nitroGLYCERIN 0.4 MG SL tablet  Commonly known as:  NITROSTAT  Place 0.4 mg under the tongue every 5 (five) minutes as needed for chest pain.     omeprazole 20 MG capsule  Commonly known as:  PRILOSEC  Take 20 mg by mouth every morning.     PRESERVISION AREDS PO  Take 1 capsule by mouth 2 (two) times daily.     senna 8.6 MG Tabs  Commonly known as:  SENOKOT  Take 1-3 tablets by mouth daily as needed (constipation). "Swiss Kriss Tabs"-active ingredient=Senna      ASK your doctor about these medications       bisacodyl 5 MG EC  tablet  Commonly known as:  DULCOLAX  Take 5 mg by mouth 2 (two) times daily.           Total Time in preparing paper work, data evaluation and todays exam - 35 minutes  Cattleya Dobratz K M.D  on 11/18/2012 at 10:23 AM  [facility] Office  [phone]

## 2012-11-24 ENCOUNTER — Other Ambulatory Visit: Payer: Self-pay | Admitting: Nephrology

## 2012-11-24 DIAGNOSIS — N183 Chronic kidney disease, stage 3 unspecified: Secondary | ICD-10-CM

## 2012-12-03 ENCOUNTER — Ambulatory Visit
Admission: RE | Admit: 2012-12-03 | Discharge: 2012-12-03 | Disposition: A | Discharge status: Home / Self Care | Payer: Medicare Other | Source: Ambulatory Visit | Attending: Nephrology | Admitting: Nephrology

## 2013-01-26 ENCOUNTER — Encounter (HOSPITAL_COMMUNITY): Payer: Self-pay | Admitting: Emergency Medicine

## 2013-01-26 ENCOUNTER — Emergency Department (HOSPITAL_COMMUNITY): Payer: Medicare Other

## 2013-01-26 ENCOUNTER — Inpatient Hospital Stay (HOSPITAL_COMMUNITY)
Admission: EM | Admit: 2013-01-26 | Discharge: 2013-01-28 | DRG: 313 | Disposition: A | Discharge status: Home / Self Care | Payer: Medicare Other | Attending: Internal Medicine | Admitting: Internal Medicine

## 2013-01-26 DIAGNOSIS — R079 Chest pain, unspecified: Principal | ICD-10-CM

## 2013-01-26 DIAGNOSIS — Z936 Other artificial openings of urinary tract status: Secondary | ICD-10-CM

## 2013-01-26 DIAGNOSIS — E1139 Type 2 diabetes mellitus with other diabetic ophthalmic complication: Secondary | ICD-10-CM | POA: Diagnosis present

## 2013-01-26 DIAGNOSIS — I252 Old myocardial infarction: Secondary | ICD-10-CM

## 2013-01-26 DIAGNOSIS — I498 Other specified cardiac arrhythmias: Secondary | ICD-10-CM | POA: Diagnosis present

## 2013-01-26 DIAGNOSIS — Z79899 Other long term (current) drug therapy: Secondary | ICD-10-CM

## 2013-01-26 DIAGNOSIS — K219 Gastro-esophageal reflux disease without esophagitis: Secondary | ICD-10-CM | POA: Diagnosis present

## 2013-01-26 DIAGNOSIS — E11319 Type 2 diabetes mellitus with unspecified diabetic retinopathy without macular edema: Secondary | ICD-10-CM | POA: Diagnosis present

## 2013-01-26 DIAGNOSIS — N289 Disorder of kidney and ureter, unspecified: Secondary | ICD-10-CM

## 2013-01-26 DIAGNOSIS — Z794 Long term (current) use of insulin: Secondary | ICD-10-CM

## 2013-01-26 DIAGNOSIS — E860 Dehydration: Secondary | ICD-10-CM

## 2013-01-26 DIAGNOSIS — R739 Hyperglycemia, unspecified: Secondary | ICD-10-CM

## 2013-01-26 DIAGNOSIS — Z8551 Personal history of malignant neoplasm of bladder: Secondary | ICD-10-CM

## 2013-01-26 DIAGNOSIS — I129 Hypertensive chronic kidney disease with stage 1 through stage 4 chronic kidney disease, or unspecified chronic kidney disease: Secondary | ICD-10-CM | POA: Diagnosis present

## 2013-01-26 DIAGNOSIS — C679 Malignant neoplasm of bladder, unspecified: Secondary | ICD-10-CM

## 2013-01-26 DIAGNOSIS — G5690 Unspecified mononeuropathy of unspecified upper limb: Secondary | ICD-10-CM | POA: Diagnosis present

## 2013-01-26 DIAGNOSIS — Z6837 Body mass index (BMI) 37.0-37.9, adult: Secondary | ICD-10-CM

## 2013-01-26 DIAGNOSIS — E872 Acidosis, unspecified: Secondary | ICD-10-CM

## 2013-01-26 DIAGNOSIS — E1159 Type 2 diabetes mellitus with other circulatory complications: Secondary | ICD-10-CM | POA: Diagnosis present

## 2013-01-26 DIAGNOSIS — S88119A Complete traumatic amputation at level between knee and ankle, unspecified lower leg, initial encounter: Secondary | ICD-10-CM

## 2013-01-26 DIAGNOSIS — G5790 Unspecified mononeuropathy of unspecified lower limb: Secondary | ICD-10-CM | POA: Diagnosis present

## 2013-01-26 DIAGNOSIS — E119 Type 2 diabetes mellitus without complications: Secondary | ICD-10-CM

## 2013-01-26 DIAGNOSIS — Z9861 Coronary angioplasty status: Secondary | ICD-10-CM

## 2013-01-26 DIAGNOSIS — Z7982 Long term (current) use of aspirin: Secondary | ICD-10-CM

## 2013-01-26 DIAGNOSIS — R7309 Other abnormal glucose: Secondary | ICD-10-CM

## 2013-01-26 DIAGNOSIS — E785 Hyperlipidemia, unspecified: Secondary | ICD-10-CM | POA: Diagnosis present

## 2013-01-26 DIAGNOSIS — I259 Chronic ischemic heart disease, unspecified: Secondary | ICD-10-CM

## 2013-01-26 DIAGNOSIS — I2510 Atherosclerotic heart disease of native coronary artery without angina pectoris: Secondary | ICD-10-CM

## 2013-01-26 DIAGNOSIS — E669 Obesity, unspecified: Secondary | ICD-10-CM | POA: Diagnosis present

## 2013-01-26 DIAGNOSIS — Z7902 Long term (current) use of antithrombotics/antiplatelets: Secondary | ICD-10-CM

## 2013-01-26 DIAGNOSIS — H3530 Unspecified macular degeneration: Secondary | ICD-10-CM | POA: Diagnosis present

## 2013-01-26 DIAGNOSIS — R000 Tachycardia, unspecified: Secondary | ICD-10-CM | POA: Diagnosis present

## 2013-01-26 DIAGNOSIS — N183 Chronic kidney disease, stage 3 unspecified: Secondary | ICD-10-CM

## 2013-01-26 DIAGNOSIS — I10 Essential (primary) hypertension: Secondary | ICD-10-CM

## 2013-01-26 DIAGNOSIS — Z87891 Personal history of nicotine dependence: Secondary | ICD-10-CM

## 2013-01-26 DIAGNOSIS — E876 Hypokalemia: Secondary | ICD-10-CM

## 2013-01-26 DIAGNOSIS — E1100 Type 2 diabetes mellitus with hyperosmolarity without nonketotic hyperglycemic-hyperosmolar coma (NKHHC): Secondary | ICD-10-CM

## 2013-01-26 DIAGNOSIS — I739 Peripheral vascular disease, unspecified: Secondary | ICD-10-CM | POA: Diagnosis present

## 2013-01-26 DIAGNOSIS — R9431 Abnormal electrocardiogram [ECG] [EKG]: Secondary | ICD-10-CM

## 2013-01-26 HISTORY — DX: Atherosclerotic heart disease of native coronary artery without angina pectoris: I25.10

## 2013-01-26 HISTORY — DX: Chronic kidney disease, stage 3 unspecified: N18.30

## 2013-01-26 LAB — POCT I-STAT TROPONIN I

## 2013-01-26 LAB — BASIC METABOLIC PANEL
Calcium: 10.1 mg/dL (ref 8.4–10.5)
Creatinine, Ser: 1.38 mg/dL — ABNORMAL HIGH (ref 0.50–1.10)
GFR calc non Af Amer: 37 mL/min — ABNORMAL LOW (ref >90)
Glucose, Bld: 644 mg/dL (ref 70–99)
Sodium: 132 mEq/L — ABNORMAL LOW (ref 135–145)

## 2013-01-26 LAB — GLUCOSE, CAPILLARY
Glucose-Capillary: 548 mg/dL — ABNORMAL HIGH (ref 70–99)
Glucose-Capillary: 436 mg/dL — ABNORMAL HIGH (ref 70–99)
Glucose-Capillary: 286 mg/dL — ABNORMAL HIGH (ref 70–99)
Glucose-Capillary: 188 mg/dL — ABNORMAL HIGH (ref 70–99)

## 2013-01-26 LAB — TROPONIN I: Troponin I: <0.3 ng/mL (ref <0.30)

## 2013-01-26 LAB — CBC
Hemoglobin: 11.7 g/dL — ABNORMAL LOW (ref 12.0–15.0)
MCH: 25.3 pg — ABNORMAL LOW (ref 26.0–34.0)
MCHC: 32.2 g/dL (ref 30.0–36.0)
Platelets: 182 10*3/uL (ref 150–400)

## 2013-01-26 LAB — TSH: TSH: 0.494 u[IU]/mL (ref 0.350–4.500)

## 2013-01-26 MED ORDER — NITROGLYCERIN 2 % TD OINT
1.0000 [in_us] | TOPICAL_OINTMENT | Freq: Four times a day (QID) | TRANSDERMAL | Status: DC
  Administered 2013-01-27 (×2): 1 [in_us] via TOPICAL
  Filled 2013-01-26: qty 30

## 2013-01-26 MED ORDER — ATORVASTATIN CALCIUM 80 MG PO TABS
80.0000 mg | ORAL_TABLET | Freq: Every day | ORAL | Status: DC
  Administered 2013-01-27: 80 mg via ORAL
  Filled 2013-01-26 (×2): qty 1

## 2013-01-26 MED ORDER — CYCLOBENZAPRINE HCL 10 MG PO TABS: 10.0000 mg | ORAL_TABLET | Freq: Once | ORAL | Status: DC

## 2013-01-26 MED ORDER — PANTOPRAZOLE SODIUM 40 MG PO TBEC: 40.0000 mg | DELAYED_RELEASE_TABLET | Freq: Every day | ORAL | Status: DC

## 2013-01-26 MED ORDER — INSULIN REGULAR BOLUS VIA INFUSION
0.0000 [IU] | Freq: Three times a day (TID) | INTRAVENOUS | Status: DC
  Filled 2013-01-26: qty 10

## 2013-01-26 MED ORDER — FUROSEMIDE 40 MG PO TABS
40.0000 mg | ORAL_TABLET | Freq: Every day | ORAL | Status: DC
  Administered 2013-01-27: 40 mg via ORAL
  Filled 2013-01-26: qty 1

## 2013-01-26 MED ORDER — BISACODYL 5 MG PO TBEC: 5.0000 mg | DELAYED_RELEASE_TABLET | Freq: Every day | ORAL | Status: DC | PRN

## 2013-01-26 MED ORDER — OMEGA-3-ACID ETHYL ESTERS 1 G PO CAPS
1.0000 g | ORAL_CAPSULE | Freq: Two times a day (BID) | ORAL | Status: DC
  Administered 2013-01-26 – 2013-01-28 (×4): 1 g via ORAL
  Filled 2013-01-26 (×5): qty 1

## 2013-01-26 MED ORDER — MELATONIN 10 MG PO TABS: 1.0000 | ORAL_TABLET | Freq: Every day | ORAL | Status: DC

## 2013-01-26 MED ORDER — SODIUM CHLORIDE 0.9 % IV SOLN
INTRAVENOUS | Status: AC
  Filled 2013-01-26: qty 1

## 2013-01-26 MED ORDER — ATORVASTATIN CALCIUM 80 MG PO TABS
80.0000 mg | ORAL_TABLET | Freq: Every evening | ORAL | Status: DC
  Filled 2013-01-26: qty 1

## 2013-01-26 MED ORDER — METOPROLOL TARTRATE 1 MG/ML IV SOLN
5.0000 mg | Freq: Once | INTRAVENOUS | Status: AC
  Administered 2013-01-26: 5 mg via INTRAVENOUS
  Filled 2013-01-26: qty 5

## 2013-01-26 MED ORDER — OMEGA-3 FATTY ACIDS 1000 MG PO CAPS
1.0000 g | ORAL_CAPSULE | Freq: Every evening | ORAL | Status: DC
  Filled 2013-01-26: qty 1

## 2013-01-26 MED ORDER — NITROGLYCERIN 0.4 MG SL SUBL: 0.4000 mg | SUBLINGUAL_TABLET | SUBLINGUAL | Status: DC | PRN

## 2013-01-26 MED ORDER — FERROUS SULFATE 325 (65 FE) MG PO TABS
325.0000 mg | ORAL_TABLET | Freq: Every day | ORAL | Status: DC
  Filled 2013-01-26 (×2): qty 1

## 2013-01-26 MED ORDER — DEXTROSE-NACL 5-0.45 % IV SOLN: INTRAVENOUS | Status: DC

## 2013-01-26 MED ORDER — VITAMIN D3 25 MCG (1000 UNIT) PO TABS
5000.0000 [IU] | ORAL_TABLET | Freq: Every day | ORAL | Status: DC
  Administered 2013-01-26 – 2013-01-27 (×2): 5000 [IU] via ORAL
  Filled 2013-01-26 (×3): qty 5

## 2013-01-26 MED ORDER — CLOPIDOGREL BISULFATE 75 MG PO TABS
75.0000 mg | ORAL_TABLET | Freq: Every day | ORAL | Status: DC
Start: 2013-01-26 — End: 2013-01-28
  Administered 2013-01-27 – 2013-01-28 (×3): 75 mg via ORAL
  Filled 2013-01-26 (×3): qty 1

## 2013-01-26 MED ORDER — FERROUS SULFATE 325 (65 FE) MG PO TABS
325.0000 mg | ORAL_TABLET | Freq: Every day | ORAL | Status: DC
  Administered 2013-01-26 – 2013-01-27 (×2): 325 mg via ORAL
  Filled 2013-01-26 (×3): qty 1

## 2013-01-26 MED ORDER — DEXTROSE 50 % IV SOLN: 25.0000 mL | INTRAVENOUS | Status: DC | PRN

## 2013-01-26 MED ORDER — METOPROLOL SUCCINATE ER 100 MG PO TB24
100.0000 mg | ORAL_TABLET | Freq: Every evening | ORAL | Status: DC
  Filled 2013-01-26: qty 1

## 2013-01-26 MED ORDER — HYDROCODONE-ACETAMINOPHEN 10-325 MG PO TABS
1.0000 | ORAL_TABLET | Freq: Three times a day (TID) | ORAL | Status: DC | PRN
  Administered 2013-01-27 – 2013-01-28 (×3): 1 via ORAL
  Filled 2013-01-26 (×3): qty 1

## 2013-01-26 MED ORDER — PANTOPRAZOLE SODIUM 40 MG PO TBEC
40.0000 mg | DELAYED_RELEASE_TABLET | Freq: Every day | ORAL | Status: DC
  Administered 2013-01-27 – 2013-01-28 (×2): 40 mg via ORAL
  Filled 2013-01-26 (×2): qty 1

## 2013-01-26 MED ORDER — HEPARIN BOLUS VIA INFUSION
4000.0000 [IU] | Freq: Once | INTRAVENOUS | Status: DC
  Filled 2013-01-26: qty 4000

## 2013-01-26 MED ORDER — ACETAMINOPHEN 325 MG PO TABS
650.0000 mg | ORAL_TABLET | Freq: Four times a day (QID) | ORAL | Status: DC | PRN
  Administered 2013-01-27: 650 mg via ORAL
  Filled 2013-01-26: qty 2

## 2013-01-26 MED ORDER — ACETAMINOPHEN 650 MG RE SUPP: 650.0000 mg | Freq: Four times a day (QID) | RECTAL | Status: DC | PRN

## 2013-01-26 MED ORDER — ENOXAPARIN SODIUM 30 MG/0.3ML ~~LOC~~ SOLN
30.0000 mg | SUBCUTANEOUS | Status: DC
  Filled 2013-01-26: qty 0.3

## 2013-01-26 MED ORDER — INSULIN REGULAR BOLUS VIA INFUSION
0.0000 [IU] | Freq: Three times a day (TID) | INTRAVENOUS | Status: DC
  Administered 2013-01-26: 8.6 [IU] via INTRAVENOUS
  Filled 2013-01-26: qty 10

## 2013-01-26 MED ORDER — OMEGA-3 FATTY ACIDS 1000 MG PO CAPS: 1.0000 g | ORAL_CAPSULE | Freq: Every day | ORAL | Status: DC

## 2013-01-26 MED ORDER — SODIUM CHLORIDE 0.9 % IV BOLUS (SEPSIS)
500.0000 mL | Freq: Once | INTRAVENOUS | Status: AC
  Administered 2013-01-26: 500 mL via INTRAVENOUS

## 2013-01-26 MED ORDER — VITAMIN D3 25 MCG (1000 UNIT) PO TABS
5000.0000 [IU] | ORAL_TABLET | Freq: Every evening | ORAL | Status: DC
  Filled 2013-01-26: qty 5

## 2013-01-26 MED ORDER — HEPARIN (PORCINE) IN NACL 100-0.45 UNIT/ML-% IJ SOLN
1350.0000 [IU]/h | INTRAMUSCULAR | Status: DC
  Administered 2013-01-26: 1300 [IU]/h via INTRAVENOUS
  Administered 2013-01-27: 1400 [IU]/h via INTRAVENOUS
  Administered 2013-01-28: 1350 [IU]/h via INTRAVENOUS
  Filled 2013-01-26 (×6): qty 250

## 2013-01-26 MED ORDER — ONDANSETRON HCL 4 MG/2ML IJ SOLN: 4.0000 mg | Freq: Four times a day (QID) | INTRAMUSCULAR | Status: DC | PRN

## 2013-01-26 MED ORDER — SODIUM CHLORIDE 0.9 % IV SOLN
INTRAVENOUS | Status: DC
  Administered 2013-01-26: 14:00:00 via INTRAVENOUS

## 2013-01-26 MED ORDER — SODIUM CHLORIDE 0.9 % IV SOLN
INTRAVENOUS | Status: DC
  Administered 2013-01-26: 11.3 [IU]/h via INTRAVENOUS
  Administered 2013-01-26: 4.9 [IU]/h via INTRAVENOUS
  Filled 2013-01-26: qty 1

## 2013-01-26 MED ORDER — METOPROLOL SUCCINATE ER 100 MG PO TB24
100.0000 mg | ORAL_TABLET | Freq: Every day | ORAL | Status: DC
  Administered 2013-01-26 – 2013-01-27 (×2): 100 mg via ORAL
  Filled 2013-01-26 (×3): qty 1

## 2013-01-26 MED ORDER — SODIUM CHLORIDE 0.9 % IV SOLN: INTRAVENOUS | Status: DC

## 2013-01-26 MED ORDER — ASPIRIN 81 MG PO CHEW
81.0000 mg | CHEWABLE_TABLET | Freq: Every morning | ORAL | Status: DC
  Administered 2013-01-27 – 2013-01-28 (×2): 81 mg via ORAL
  Filled 2013-01-26: qty 1

## 2013-01-26 MED ORDER — SODIUM CHLORIDE 0.9 % IJ SOLN
3.0000 mL | Freq: Two times a day (BID) | INTRAMUSCULAR | Status: DC
  Administered 2013-01-27: 3 mL via INTRAVENOUS
  Administered 2013-01-27: 10:00:00 via INTRAVENOUS
  Administered 2013-01-28: 3 mL via INTRAVENOUS

## 2013-01-26 MED ORDER — ONDANSETRON HCL 4 MG PO TABS: 4.0000 mg | ORAL_TABLET | Freq: Four times a day (QID) | ORAL | Status: DC | PRN

## 2013-01-26 MED ORDER — SENNA 8.6 MG PO TABS
4.0000 | ORAL_TABLET | Freq: Every day | ORAL | Status: DC
  Administered 2013-01-26 – 2013-01-27 (×2): 34.4 mg via ORAL
  Filled 2013-01-26 (×3): qty 4

## 2013-01-26 MED ORDER — NITROGLYCERIN 2 % TD OINT
1.0000 [in_us] | TOPICAL_OINTMENT | Freq: Once | TRANSDERMAL | Status: AC
  Administered 2013-01-26: 1 [in_us] via TOPICAL
  Filled 2013-01-26: qty 1

## 2013-01-26 NOTE — ED Notes (Signed)
Attempted report 

## 2013-01-26 NOTE — H&P (Signed)
[facility] History and Physical  [name] ZOX:[id number] DOB: [birth year] DOA: 01/26/2013  Referring physician: Dr. Lynelle Doctor PCP: Gaye Alken, MD  Specialists: *  Chief Complaint: Chest pain  HPI: [name] is a 72 y.o. female with history of coronary artery disease with stent S/P MI in January 2014 2 days following surgery for bladder cancer at [facility], uncontrolled diabetes mellitus, hypertension, hyperlipidemia peripheral vascular disease, GERD, Renal insufficiency who presents with above complaints. She states that today she had regularly scheduled appointment with Dr. Darrick Penna in a just before she left arm she began having a mild chest pain with some shortness of breath and when she got to the office for chest pain are worsened-up to about 8-9/10 in intensity, with radiation to her left arm and associated with shortness of breath, and she felt cold and clammy. She denies nausea or vomiting. She alerted the staff at the office was taken to her room, given sublingual nitroglycerin she states within about 2 minutes her pain resolved. EMS was called, she given aspirin and was transported to the ED. Patient states that the pain lasted a total of about 30 minutes. She states that when she had the cardiac cath at [facility] in January 2014, and a stent was placed, she was told that she had 3 other blockages about 70%. Upon arrival in the ED patient had a Troponin done which was negative, blood glucose on Bmet was 644. Cardiology was consulted, she was started on IV insulin per glucose stabilizer and admitted to medicine service for further evaluation and management. Pt sates she missed insulin doses last pm and this am.  Review of Systems: The patient denies anorexia, fever, weight loss, vision loss, decreased hearing, hoarseness, syncope, balance deficits, hemoptysis, abdominal pain, melena, hematochezia, hematuria, incontinence, genital sores, muscle weakness, suspicious skin lesions,  transient blindness, difficulty walking, depression, unusual weight change, abnormal bleeding.    Past Medical History  Diagnosis Date  . Hypertension   . Hyperlipidemia   . History of atrial tachycardia CONTROLLED W/ LOPRESSOR  . Peripheral vascular disease S/P RIGHT BKA  . Headache(784.0)   . Peripheral neuropathy LEGS AND HANDS  . Arthritis   . S/P BKA (below knee amputation) unilateral RIGHT  . GERD (gastroesophageal reflux disease)   . Anemia   . Blood transfusion   . Diabetes mellitus ORAL AND INSULIN MEDS  . Macular degeneration of both eyes   . Glaucoma   . Retinopathy due to secondary diabetes mellitus   . Insomnia   . Constipation   . History of bladder cancer TCC AND CIS  . MI (mitral incompetence)   . Coronary artery disease    Past Surgical History  Procedure Laterality Date  . I & d right below knee amputation wound  06-10-2011  . Cysto / resection bladder bx's  04-01-11  &  11-26-10  . Below knee leg amputation  05-02-2007    RIGHT  . Right foot i & d / removal necrotic bone  JUN  &  AUG 2008  . Right great toe amputation  11-05-2006  . Laparoscopic cholecystectomy  12-10-1999  . Back surgery  1991   &  1971  . Appendectomy  1963  . Cataract extraction w/ intraocular lens  implant, bilateral    . Cystoscopy with biopsy  10/14/2011    Procedure: CYSTOSCOPY WITH BIOPSY;  Surgeon: Garnett Farm, MD;  Location: [facility];  Service: Urology;  Laterality: N/A;  BLADDER BIOPSY  . Coronary  stent placement    . Abdominal hysterectomy    . Revision urostomy cutaneous    . Revision urostomy cutaneous     Social History:  reports that she quit smoking about 37 years ago. Her smoking use included Cigarettes. She has a 34 pack-year smoking history. She has never used smokeless tobacco. She reports that  drinks alcohol. She reports that she does not use illicit drugs.  where does patient live-t home  Can patient participate in ADLs-yes  Allergies   Allergen Reactions  . Clarithromycin Itching  . Lisinopril Other (See Comments)    Increased creatinine levels (pt currently taking low dose)  . Metformin And Related Other (See Comments)    Increased creatinine levels    Family History  Problem Relation Age of Onset  . Heart failure Mother     died age 55  . Sudden death Father 25   Prior to Admission medications   Medication Sig Start Date End Date Taking? Authorizing Provider  acetaminophen (TYLENOL) 500 MG tablet Take 1,000 mg by mouth as needed for pain.   Yes Historical Provider, MD  aspirin 81 MG chewable tablet Chew 81 mg by mouth every morning.    Yes Historical Provider, MD  atorvastatin (LIPITOR) 80 MG tablet Take 80 mg by mouth every evening.    Yes Historical Provider, MD  cholecalciferol (VITAMIN D) 1000 UNITS tablet Take 5,000 Units by mouth every evening.    Yes Historical Provider, MD  clopidogrel (PLAVIX) 75 MG tablet Take 75 mg by mouth daily.   Yes Historical Provider, MD  ferrous sulfate 325 (65 FE) MG tablet Take 325 mg by mouth daily.   Yes Historical Provider, MD  fish oil-omega-3 fatty acids 1000 MG capsule Take 1 g by mouth every evening.   Yes Historical Provider, MD  HYDROcodone-acetaminophen (NORCO) 10-325 MG per tablet Take 1 tablet by mouth every 8 (eight) hours as needed for pain. 11/18/12  Yes Leroy Sea, MD  insulin glargine (LANTUS) 100 UNIT/ML injection Inject 30-80 Units into the skin 2 (two) times daily. 55 units in the morning and 60 units at night   Yes Historical Provider, MD  insulin lispro (HUMALOG) 100 UNIT/ML injection Inject 20-50 Units into the skin daily as needed for high blood sugar. PER SLIDING SCALE   Yes Historical Provider, MD  Melatonin 10 MG TABS Take 1 tablet by mouth at bedtime.   Yes Historical Provider, MD  metoprolol succinate (TOPROL-XL) 100 MG 24 hr tablet Take 100 mg by mouth every evening. Take with or immediately following a meal.   Yes Historical Provider, MD   Multiple Vitamins-Minerals (PRESERVISION AREDS PO) Take 1 capsule by mouth 2 (two) times daily.   Yes Historical Provider, MD  nitroGLYCERIN (NITROSTAT) 0.4 MG SL tablet Place 0.4 mg under the tongue every 5 (five) minutes as needed for chest pain.   Yes Historical Provider, MD  omeprazole (PRILOSEC) 20 MG capsule Take 20 mg by mouth daily as needed (gurd).    Yes Historical Provider, MD  senna (SENOKOT) 8.6 MG TABS Take 4 tablets by mouth at bedtime. "Swiss Kriss Tabs"-active ingredient=Senna   Yes Historical Provider, MD  SODIUM BICARBONATE PO Take 20 g by mouth 2 (two) times daily.   Yes Historical Provider, MD   Physical Exam: Filed Vitals:   01/26/13 1500 01/26/13 1515 01/26/13 1530 01/26/13 1600  BP: 134/53  123/63 140/68  Pulse: 88 90 89 90  Temp:    97.8 F (36.6 C)  TempSrc:  Oral  Resp: 13 13 12 16   Height:   5\' 3"  (1.6 m)   Weight:   97.2 kg (214 lb 4.6 oz)   SpO2: 98% 99% 98% 100%    Constitutional: Vital signs reviewed.  Patient is a well-developed and obese female in no acute distress and cooperative with exam. Alert and oriented x3.  Head: Normocephalic and atraumatic Nose: No erythema or drainage noted.  Turbinates normal Mouth: no erythema or exudates, MMM Eyes: PERRL, EOMI, conjunctivae normal, No scleral icterus.  Neck: Supple, Trachea midline normal ROM, No JVD, mass, thyromegaly, or carotid bruit present.  Cardiovascular: RRR, S1 normal, S2 normal, no MRG, pulses symmetric and intact bilaterally Pulmonary/Chest: normal respiratory effort, CTAB, no wheezes, rales, or rhonchi Abdominal: Soft. Mild epigastric tenderness, non-distended, urostomy bag present on left clear yellowish liq/urine; bowel sounds are normal, no masses, organomegaly, or guarding present.  GU: no CVA tenderness Extremities: R.BKA, LLE +1 edema, no cyanosis Neurological: A&O x3, Strength is normal, cranial nerve II-XII are grossly intact, no focal motor deficit, sensory intact to light touch  bilaterally.  Skin: Warm, dry and intact. No rash, cyanosis, or clubbing.  Psychiatric: Normal mood and affect. speech and behavior is normal. Judgment and thought content normal. Cognition and memory are normal.     Labs on Admission:  Basic Metabolic Panel:  Recent Labs Lab 01/26/13 1205  NA 132*  K 3.8  CL 94*  CO2 24  GLUCOSE 644*  BUN 59*  CREATININE 1.38*  CALCIUM 10.1   Liver Function Tests: No results found for this basename: AST, ALT, ALKPHOS, BILITOT, PROT, ALBUMIN,  in the last 168 hours No results found for this basename: LIPASE, AMYLASE,  in the last 168 hours No results found for this basename: AMMONIA,  in the last 168 hours CBC:  Recent Labs Lab 01/26/13 1205  WBC 5.9  HGB 11.7*  HCT 36.3  MCV 78.4  PLT 182   Cardiac Enzymes: No results found for this basename: CKTOTAL, CKMB, CKMBINDEX, TROPONINI,  in the last 168 hours  BNP (last 3 results) No results found for this basename: PROBNP,  in the last 8760 hours CBG:  Recent Labs Lab 01/26/13 1434 01/26/13 1538 01/26/13 1644  GLUCAP 548* 504* 436*    Radiological Exams on Admission: Dg Chest Port 1 View  01/26/2013   *RADIOLOGY REPORT*  Clinical Data: Chest pain.  PORTABLE CHEST - 1 VIEW  Comparison: 11/14/2012.  Findings: The cardiac silhouette, mediastinal and hilar contours are within normal limits and stable.  There is tortuosity and calcification of the thoracic aorta.  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 No acute infiltrates, edema or effusions.  IMPRESSION: Chronic basilar scarring changes but no acute pulmonary findings.   Original Report Authenticated By: Rudie Meyer, M.D.    EKG: Independently reviewed. ST depression present inferiorly and laterally  Assessment/Plan Principal Problem:   Chest pain -As discussed above in pt with CAD S/P in January  with stent placement -cycle CEs, obtain ECHO for EF -continue plavix/ASA,  B-blocker and statin -place on NTP, and start heparin drip as per cards recs -Dr Anne Fu to see in am for further eval and management  Active Problems:  Uncontrolled DM2 (diabetes mellitus, type 2)/Hyperglycemia without ketosis -continue iv insulin via glucostabilizer -monitor accuchecks, follow and transition to lantus when clinically appropriate -likely secondary to noncompliance, she states she missed her insulin last pm and today   Transitional cell carcinoma of bladder  -s/p surgery Jan  2014   HTN (hypertension) -continue out pt meds   CAD (coronary artery disease)   Sinus tachycardia   Renal insufficiency -improved( cr 1.76 11/2012), follow and recheck. She is followed by Dr Deterding     Code Status: Full Family Communication: None at bedside Disposition Plan: Admit to step down  Time spent: >35mins  Kela Millin [facility] Pager [phone]  If 7PM-7AM, please contact night-coverage [email or web address] Password [facility] 01/26/2013, 5:19 PM

## 2013-01-26 NOTE — ED Notes (Signed)
Pt arrives via EMS from [facility] today for check up. Pt began having substernal chest pain while there radiating to left arm with SOB. Pt received 1 spray of nitro in office. Got relief from 8/10 to 0/10 at present. Received 4 baby ASA by EMs, 20g right hand, EKG. Nstemi in Feb.

## 2013-01-26 NOTE — Evaluation (Signed)
Received call from nurse about pt requesting melatonin for sleep. Takes this at home. CP free. VSS.  Added melatonin on to regimen.

## 2013-01-26 NOTE — Progress Notes (Signed)

## 2013-01-26 NOTE — Consult Note (Addendum)
Admit date: 01/26/2013 Referring Physician   Dr. Lynelle Doctor Primary Physician  Dr. Darrick Penna Primary Cardiologist  Dr. Donato Schultz Reason for Consultation  Chest pain  [id number] is a 72yo female with a histry of HTN/dyslipidemia/atrial tachcyardia/PVD/DM/CAD who reports she was admitted to the hospital in January at [facility] for bladder cancer and had surgery which included a hysterectomy and part of her vagina was removed. She now has a urostomy. She reports 2 days after she had her surgery she had been doing well and she had acute onset of shortness of breath without chest pain. It was determined that she was having an MI and she did have one stent placed. She does not know what blood vessel, but does state she was told she had 3 other blockages that were about 70%. She states she felt fine today and was feeling nervous and shaky because she had an appointment with her nephrologist in followup for some renal failure that she suffered in April. She states about 10:30 she started getting a central chest pain that she describes as a tightness. She states her left arm felt funny like it was swollen and tight. She also had some shortness of breath and some mild diaphoresis. She states that pain was constant. She drove herself to see her nephrologist for her appointment today. When she got to the office the discomfort got a lot worse. She informed the staff and they immediately put her in a room and gave her nitroglycerin sublingual x1 and she reports her pain was gone in 2-3 minutes. EMS was called and transported her to the ED. They gave her aspirin. She states currently her pain is a 0/10. She states at its worst her pain was a 9/10. She states she's had one or 2 other milder symptoms in the past 3 weeks.   Cardiology is now asked to consult for further treatment of CP.    PMH:   Past Medical History  Diagnosis Date  . Hypertension   . Hyperlipidemia   . History of atrial tachycardia CONTROLLED W/ LOPRESSOR  .  Peripheral vascular disease S/P RIGHT BKA  . Headache(784.0)   . Peripheral neuropathy LEGS AND HANDS  . Arthritis   . S/P BKA (below knee amputation) unilateral RIGHT  . GERD (gastroesophageal reflux disease)   . Anemia   . Blood transfusion   . Diabetes mellitus ORAL AND INSULIN MEDS  . Macular degeneration of both eyes   . Glaucoma   . Retinopathy due to secondary diabetes mellitus   . Insomnia   . Constipation   . History of bladder cancer TCC AND CIS  . MI (mitral incompetence)   . Coronary artery disease      PSH:   Past Surgical History  Procedure Laterality Date  . I & d right below knee amputation wound  06-10-2011  . Cysto / resection bladder bx's  04-01-11  &  11-26-10  . Below knee leg amputation  05-02-2007    RIGHT  . Right foot i & d / removal necrotic bone  JUN  &  AUG 2008  . Right great toe amputation  11-05-2006  . Laparoscopic cholecystectomy  12-10-1999  . Back surgery  1991   &  1971  . Appendectomy  1963  . Cataract extraction w/ intraocular lens  implant, bilateral    . Cystoscopy with biopsy  10/14/2011    Procedure: CYSTOSCOPY WITH BIOPSY;  Surgeon: Garnett Farm, MD;  Location: [facility];  Service: Urology;  Laterality: N/A;  BLADDER BIOPSY  . Coronary stent placement    . Abdominal hysterectomy    . Revision urostomy cutaneous    . Revision urostomy cutaneous      Allergies:  Clarithromycin; Lisinopril; and Metformin and related Prior to Admit Meds:   (Not in a hospital admission) Fam HX:    Family History  Problem Relation Age of Onset  . Heart failure Mother     died age 40  . Sudden death Father 32   Social HX:    History   Social History  . Marital Status: Legally Separated    Spouse Name: N/A    Number of Children: N/A  . Years of Education: N/A   Occupational History  . Not on file.   Social History Main Topics  . Smoking status: Former Smoker -- 2.00 packs/day for 17 years    Types: Cigarettes    Quit  date: 10/09/1975  . Smokeless tobacco: Never Used  . Alcohol Use: Yes     Comment: RARE  . Drug Use: No  . Sexually Active: Not on file   Other Topics Concern  . Not on file   Social History Narrative  . No narrative on file     ROS:  All 11 ROS were addressed and are negative except what is stated in the HPI  Physical Exam: Blood pressure 141/58, pulse 90, temperature 98.6 F (37 C), resp. rate 13, SpO2 99.00%.    General: Well developed, well nourished, in no acute distress Head: Eyes PERRLA, No xanthomas.   Normal cephalic and atramatic  Lungs:   Clear bilaterally to auscultation and percussion. Heart:   HRRR S1 S2 Pulses are 2+ & equal.            No carotid bruit. No JVD.  No abdominal bruits. No femoral bruits. Abdomen: Bowel sounds are positive, abdomen soft and non-tender without masses or                  Hernia's noted. Msk:  Back normal, normal gait. Normal strength and tone for age. Extremities:   No clubbing, cyanosis or edema.  DP +1 Neuro: Alert and oriented X 3. Psych:  Good affect, responds appropriately    Labs:   Lab Results  Component Value Date   WBC 5.9 01/26/2013   HGB 11.7* 01/26/2013   HCT 36.3 01/26/2013   MCV 78.4 01/26/2013   PLT 182 01/26/2013    Recent Labs Lab 01/26/13 1205  NA 132*  K 3.8  CL 94*  CO2 24  BUN 59*  CREATININE 1.38*  CALCIUM 10.1  GLUCOSE 644*   No results found for this basename: PTT   Lab Results  Component Value Date   INR 1.03 01/26/2013   INR 0.98 06/10/2011       Radiology:  Dg Chest Port 1 View  01/26/2013   *RADIOLOGY REPORT*  Clinical Data: Chest pain.  PORTABLE CHEST - 1 VIEW  Comparison: 11/14/2012.  Findings: The cardiac silhouette, mediastinal and hilar contours are within normal limits and stable.  There is tortuosity and calcification of the thoracic aorta.  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 No acute infiltrates, edema or effusions.   IMPRESSION: Chronic basilar scarring changes but no acute pulmonary findings.   Original Report Authenticated By: Rudie Meyer, M.D.    EKG:  Sinus tachycardia with nonspecific ST depression in the inferior leads and V1 and V2  ASSESSMENT:  1. Chest  pain with nonspecific ST depression new from EKG 2012 now resolved after SL NTG.  Initial troponin normal.  Symptoms are concerning for ischemia and EKG shows new ST depression compared to February 2014. 2.  History of recent NSTEMI resulting in stent placement at [facility] after bladder surgery - records not available at this time but per patient she had residual blockage in other vessels 3.  Sinus tachcyardia 4.  DM with markedly elevated BS 5.  HTN 6.  CAD with recent NSTEMI with stent per patient at [facility] 5.  Dyslipidemia  PLAN:   1.  Cycle cardiac enzymes 2.  IV Heparin gtt per pharmacy/NTPaste 3.  2D echo assess LVF 4.  Get records from [facility] 5.  ASA/statin/Plavix/beta blocker 6.  Check TSH secondary to tachcardia 7.  Renal insufficiency followed by Dr. Darrick Penna 8.  NPO after midnight for further testing in am determined by Dr. Anne Fu.  Would consider nuclear stress test first to assess for an area if ischemia and avoid repeat cath if possible given underlying renal insufficiency.  She is in the 6 month window for stent restenosis.  Quintella Reichert, MD  01/26/2013  3:33 PM

## 2013-01-26 NOTE — ED Notes (Signed)
Attempted report X2 

## 2013-01-26 NOTE — Progress Notes (Addendum)
ANTICOAGULATION CONSULT NOTE - Initial Consult  Pharmacy Consult for Heparin Indication: chest pain/ACS  Allergies  Allergen Reactions  . Clarithromycin Itching  . Lisinopril Other (See Comments)    Increased creatinine levels (pt currently taking low dose)  . Metformin And Related Other (See Comments)    Increased creatinine levels    Patient Measurements: Height: 5\' 3"  (160 cm) Weight: 214 lb 4.6 oz (97.2 kg) IBW/kg (Calculated) : 52.4 Heparin Dosing Weight: 77 Kg  Vital Signs: Temp: 97.8 F (36.6 C) (06/17 1600) Temp src: Oral (06/17 1600) BP: 140/68 mmHg (06/17 1600) Pulse Rate: 90 (06/17 1600)  Labs:  Recent Labs  01/26/13 1205 01/26/13 1244  HGB 11.7*  --   HCT 36.3  --   PLT 182  --   APTT  --  29  LABPROT  --  13.4  INR  --  1.03  CREATININE 1.38*  --     Estimated Creatinine Clearance: 41.5 ml/min (by C-G formula based on Cr of 1.38).   Medical History: Past Medical History  Diagnosis Date  . Hypertension   . Hyperlipidemia   . History of atrial tachycardia CONTROLLED W/ LOPRESSOR  . Peripheral vascular disease S/P RIGHT BKA  . Headache(784.0)   . Peripheral neuropathy LEGS AND HANDS  . Arthritis   . S/P BKA (below knee amputation) unilateral RIGHT  . GERD (gastroesophageal reflux disease)   . Anemia   . Blood transfusion   . Diabetes mellitus ORAL AND INSULIN MEDS  . Macular degeneration of both eyes   . Glaucoma   . Retinopathy due to secondary diabetes mellitus   . Insomnia   . Constipation   . History of bladder cancer TCC AND CIS  . MI (mitral incompetence)   . Coronary artery disease     Assessment: 72 year old female with a history of recent NSTEMI resulting in stent placement at [facility] after bladder surgery (January 2014).  At her doctor's office, she experienced chest pain and was transferred to [facility].  Has underlying renal insufficiency.  Planning nuclear stress test in AM.  She is in the 6 month window for stent  restenosis.  Goal of Therapy:  Heparin level 0.3-0.7 units/ml Monitor platelets by anticoagulation protocol: Yes   Plan:  1) Heparin 4000 units iv bolus x 1 2) Heparin drip at 1300 units / hr 3) Heparin level 6 hours after heparin begins 4) Daily heparin level / CBC  Thank you. Okey Regal, PharmD [phone]  01/26/2013,5:18 PM

## 2013-01-26 NOTE — ED Notes (Signed)
Pharmacy tech at bedside 

## 2013-01-26 NOTE — ED Notes (Signed)
548 CBG

## 2013-01-26 NOTE — ED Provider Notes (Addendum)
History     CSN: [id number]  Arrival date & time 01/26/13  1157   First MD Initiated Contact with Patient 01/26/13 1204      Chief Complaint  Patient presents with  . Chest Pain    (Consider location/radiation/quality/duration/timing/severity/associated sxs/prior treatment) HPI Patient reports she was admitted to the hospital in January at [facility] for bladder cancer and had surgery which included a hysterectomy and part of her vagina was removed. She now has a urostomy. She reports 2 days after she had her surgery she had been doing well and she had acute onset of shortness of breath without chest pain. It was determined that she was having an MI and she did have one stent placed. She does not know what blood vessel, but does state she was told she had 3 other blockages that were about 70%. She states she felt fine today and was feeling nervous and shaky because she had an appointment with her nephrologist in followup for some renal failure that she suffered in April. She states about 10:30 she started getting a central chest pain that she describes as a tightness. She states her left arm felt funny like it was swollen and tight. She also had some shortness of breath and some mild diaphoresis. She states that pain was constant and also sharp. She drove herself to see her nephrologist for her appointment today. When she got to the office the discomfort got a lot worse. She informed the staff and they immediately put her in a room and gave her nitroglycerin sublingual x1 and she reports her pain was gone in 2-3 minutes. EMS was called and transported her to the ED. They gave her aspirin. She states currently her pain is a 0/10. She states at its worst her pain was a 9/10. She states she's had one or 2 other milder symptoms in the past 3 weeks.   PCP Dr Carlota Raspberry Cardiologist Dr Anne Fu Nephrologist Dr Deterding  Past Medical History  Diagnosis Date  . Hypertension   . Hyperlipidemia   . History of  atrial tachycardia CONTROLLED W/ LOPRESSOR  . Peripheral vascular disease S/P RIGHT BKA  . Headache(784.0)   . Peripheral neuropathy LEGS AND HANDS  . Arthritis   . S/P BKA (below knee amputation) unilateral RIGHT  . GERD (gastroesophageal reflux disease)   . Anemia   . Blood transfusion   . Diabetes mellitus ORAL AND INSULIN MEDS  . Macular degeneration of both eyes   . Glaucoma   . Retinopathy due to secondary diabetes mellitus   . Insomnia   . Constipation   . History of bladder cancer TCC AND CIS  . MI (mitral incompetence)   . Coronary artery disease     Past Surgical History  Procedure Laterality Date  . I & d right below knee amputation wound  06-10-2011  . Cysto / resection bladder bx's  04-01-11  &  11-26-10  . Below knee leg amputation  05-02-2007    RIGHT  . Right foot i & d / removal necrotic bone  JUN  &  AUG 2008  . Right great toe amputation  11-05-2006  . Laparoscopic cholecystectomy  12-10-1999  . Back surgery  1991   &  1971  . Appendectomy  1963  . Cataract extraction w/ intraocular lens  implant, bilateral    . Cystoscopy with biopsy  10/14/2011    Procedure: CYSTOSCOPY WITH BIOPSY;  Surgeon: Garnett Farm, MD;  Location: [facility];  Service: Urology;  Laterality: N/A;  BLADDER BIOPSY  . Coronary stent placement    . Abdominal hysterectomy    . Revision urostomy cutaneous    . Revision urostomy cutaneous      Family History  Problem Relation Age of Onset  . Heart failure Mother     died age 36  . Sudden death Father 69    History  Substance Use Topics  . Smoking status: Former Smoker -- 2.00 packs/day for 17 years    Types: Cigarettes    Quit date: 10/09/1975  . Smokeless tobacco: Never Used  . Alcohol Use: Yes     Comment: RARE  lives at home  OB History   Grav Para Term Preterm Abortions TAB SAB Ect Mult Living                  Review of Systems  All other systems reviewed and are negative.    Allergies   Clarithromycin; Lisinopril; and Metformin and related  Home Medications   Current Outpatient Rx  Name  Route  Sig  Dispense  Refill  . acetaminophen (TYLENOL) 500 MG tablet   Oral   Take 1,000 mg by mouth as needed for pain.         Marland Kitchen aspirin 81 MG chewable tablet   Oral   Chew 81 mg by mouth every morning.          Marland Kitchen atorvastatin (LIPITOR) 80 MG tablet   Oral   Take 80 mg by mouth every evening.          . cholecalciferol (VITAMIN D) 1000 UNITS tablet   Oral   Take 5,000 Units by mouth every evening.          . clopidogrel (PLAVIX) 75 MG tablet   Oral   Take 75 mg by mouth daily.         . ferrous sulfate 325 (65 FE) MG tablet   Oral   Take 325 mg by mouth daily.         . fish oil-omega-3 fatty acids 1000 MG capsule   Oral   Take 1 g by mouth every evening.         Marland Kitchen HYDROcodone-acetaminophen (NORCO) 10-325 MG per tablet   Oral   Take 1 tablet by mouth every 8 (eight) hours as needed for pain.   10 tablet   0   . insulin glargine (LANTUS) 100 UNIT/ML injection   Subcutaneous   Inject 30-80 Units into the skin 2 (two) times daily. 55 units in the morning and 60 units at night         . insulin lispro (HUMALOG) 100 UNIT/ML injection   Subcutaneous   Inject 20-50 Units into the skin daily as needed for high blood sugar. PER SLIDING SCALE         . Melatonin 10 MG TABS   Oral   Take 1 tablet by mouth at bedtime.         . metoprolol succinate (TOPROL-XL) 100 MG 24 hr tablet   Oral   Take 100 mg by mouth every evening. Take with or immediately following a meal.         . Multiple Vitamins-Minerals (PRESERVISION AREDS PO)   Oral   Take 1 capsule by mouth 2 (two) times daily.         . nitroGLYCERIN (NITROSTAT) 0.4 MG SL tablet   Sublingual   Place 0.4 mg under the tongue every 5 (five)  minutes as needed for chest pain.         Marland Kitchen omeprazole (PRILOSEC) 20 MG capsule   Oral   Take 20 mg by mouth daily as needed (gurd).           . senna (SENOKOT) 8.6 MG TABS   Oral   Take 4 tablets by mouth at bedtime. "Swiss Kriss Tabs"-active ingredient=Senna         . SODIUM BICARBONATE PO   Oral   Take 20 g by mouth 2 (two) times daily.           BP 135/87  Pulse 122  Temp(Src) 98.6 F (37 C)  Resp 16  SpO2 97%  Vital signs normal except tachycardia  Physical Exam  Nursing note and vitals reviewed. Constitutional: She is oriented to person, place, and time. She appears well-developed and well-nourished.  Non-toxic appearance. She does not appear ill. No distress.  HENT:  Head: Normocephalic and atraumatic.  Right Ear: External ear normal.  Left Ear: External ear normal.  Nose: Nose normal. No mucosal edema or rhinorrhea.  Mouth/Throat: Oropharynx is clear and moist and mucous membranes are normal. No dental abscesses or edematous.  Eyes: Conjunctivae and EOM are normal. Pupils are equal, round, and reactive to light.  Neck: Normal range of motion and full passive range of motion without pain. Neck supple.  Cardiovascular: Regular rhythm and normal heart sounds.  Tachycardia present.  Exam reveals no gallop and no friction rub.   No murmur heard. Pulmonary/Chest: Effort normal and breath sounds normal. No respiratory distress. She has no wheezes. She has no rhonchi. She has no rales. She exhibits no tenderness and no crepitus.  Abdominal: Soft. Normal appearance and bowel sounds are normal. She exhibits no distension. There is no tenderness. There is no rebound and no guarding.  Musculoskeletal: Normal range of motion. She exhibits edema. She exhibits no tenderness.  Moves all extremities well. , s/p right BKA  Mild edema around left ankle  Neurological: She is alert and oriented to person, place, and time. She has normal strength. No cranial nerve deficit.  Skin: Skin is warm, dry and intact. No rash noted. No erythema. No pallor.  Psychiatric: She has a normal mood and affect. Her speech is normal and  behavior is normal. Her mood appears not anxious.    ED Course  Procedures (including critical care time)  Medications  nitroGLYCERIN (NITROSTAT) SL tablet 0.4 mg (not administered)  dextrose 5 %-0.45 % sodium chloride infusion (not administered)  insulin regular bolus via infusion 0-10 Units (not administered)  insulin regular (NOVOLIN R,HUMULIN R) 1 Units/mL in sodium chloride 0.9 % 100 mL infusion (not administered)  dextrose 50 % solution 25 mL (not administered)  0.9 %  sodium chloride infusion ( Intravenous New Bag/Given 01/26/13 1425)  nitroGLYCERIN (NITROGLYN) 2 % ointment 1 inch (1 inch Topical Given 01/26/13 1314)  sodium chloride 0.9 % bolus 500 mL (0 mLs Intravenous Stopped 01/26/13 1403)  metoprolol (LOPRESSOR) injection 5 mg (5 mg Intravenous Given 01/26/13 1430)   Patient had nitroglycerin paste applied. Her hyperglycemia was treated with insulin drip.  1356 PM patient discussed with Dr. Mayford Knife, cardiologist. She wants the hospitalist to admit the patient because of all of her underlying medical problems and they will consult on her.  Pt given test results, states she didn't take any of her am meds except for her bicarbonate, and last night she "felt fuzzy" and didn't take her insulin.   Dr Donna Bernard, admit  to step down, team 10  Results for orders placed during the hospital encounter of 01/26/13  CBC      Result Value Range   WBC 5.9  4.0 - 10.5 K/uL   RBC 4.63  3.87 - 5.11 MIL/uL   Hemoglobin 11.7 (*) 12.0 - 15.0 g/dL   HCT 19.1  47.8 - 29.5 %   MCV 78.4  78.0 - 100.0 fL   MCH 25.3 (*) 26.0 - 34.0 pg   MCHC 32.2  30.0 - 36.0 g/dL   RDW 62.1 (*) 30.8 - 65.7 %   Platelets 182  150 - 400 K/uL  BASIC METABOLIC PANEL      Result Value Range   Sodium 132 (*) 135 - 145 mEq/L   Potassium 3.8  3.5 - 5.1 mEq/L   Chloride 94 (*) 96 - 112 mEq/L   CO2 24  19 - 32 mEq/L   Glucose, Bld 644 (*) 70 - 99 mg/dL   BUN 59 (*) 6 - 23 mg/dL   Creatinine, Ser 8.46 (*) 0.50 - 1.10 mg/dL    Calcium 96.2  8.4 - 10.5 mg/dL   GFR calc non Af Amer 37 (*) >90 mL/min   GFR calc Af Amer 43 (*) >90 mL/min  APTT      Result Value Range   aPTT 29  24 - 37 seconds  PROTIME-INR      Result Value Range   Prothrombin Time 13.4  11.6 - 15.2 seconds   INR 1.03  0.00 - 1.49  POCT I-STAT TROPONIN I      Result Value Range   Troponin i, poc 0.00  0.00 - 0.08 ng/mL   Comment 3            Laboratory interpretation all normal except hyperglycemia, compensatory hyponatremia, renal insufficiency   Dg Chest Port 1 View  01/26/2013   *RADIOLOGY REPORT*  Clinical Data: Chest pain.  PORTABLE CHEST - 1 VIEW  Comparison: 11/14/2012.  Findings: The cardiac silhouette, mediastinal and hilar contours are within normal limits and stable.  There is tortuosity and calcification of the thoracic aorta.  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 No acute infiltrates, edema or effusions.  IMPRESSION: Chronic basilar scarring changes but no acute pulmonary findings.   Original Report Authenticated By: Rudie Meyer, M.D.      Date: 01/26/2013  Rate: 125  Rhythm: sinus tachycardia  QRS Axis: normal  Intervals: normal  ST/T Wave abnormalities: ST depressions inferiorly and ST depressions laterally  Conduction Disutrbances:none  Narrative Interpretation:   Old EKG Reviewed: changes noted from 11/26/2010, HR was 69, no ST depressions seen     1. Chest pain at rest   2. Coronary heart disease   3. Hyperglycemia without ketosis   4. Tachycardia   5. Renal insufficiency   6. Abnormal EKG     Plan admission   Devoria Albe, MD, FACEP    MDM patient has known coronary artery disease and has started having chest pain the last 3 weeks, today was the worst. She also has been noncompliant with her medication, she did not take her insulin  last night or this morning which explains her hyperglycemia. She also has a tachycardia that has persisted despite a fluid  bolus most likely from not taking her metoprolol. She was given IV metoprolol in the ED. She was also started on an insulin drip for her hyperglycemia.           Torrie Namba  Hildred Laser, MD 01/26/13 1433  Ward Givens, MD 01/26/13 1950

## 2013-01-27 DIAGNOSIS — E860 Dehydration: Secondary | ICD-10-CM | POA: Diagnosis present

## 2013-01-27 DIAGNOSIS — E876 Hypokalemia: Secondary | ICD-10-CM | POA: Diagnosis present

## 2013-01-27 LAB — BASIC METABOLIC PANEL
BUN: 52 mg/dL — ABNORMAL HIGH (ref 6–23)
Chloride: 103 mEq/L (ref 96–112)
GFR calc Af Amer: 46 mL/min — ABNORMAL LOW (ref >90)
GFR calc non Af Amer: 40 mL/min — ABNORMAL LOW (ref >90)
Potassium: 2.9 mEq/L — ABNORMAL LOW (ref 3.5–5.1)
Sodium: 136 mEq/L (ref 135–145)

## 2013-01-27 LAB — CBC
MCH: 24.8 pg — ABNORMAL LOW (ref 26.0–34.0)
MCHC: 32.7 g/dL (ref 30.0–36.0)
Platelets: 191 10*3/uL (ref 150–400)
RDW: 16.4 % — ABNORMAL HIGH (ref 11.5–15.5)

## 2013-01-27 LAB — HEPARIN LEVEL (UNFRACTIONATED): Heparin Unfractionated: 0.31 IU/mL (ref 0.30–0.70)

## 2013-01-27 LAB — GLUCOSE, CAPILLARY
Glucose-Capillary: 159 mg/dL — ABNORMAL HIGH (ref 70–99)
Glucose-Capillary: 151 mg/dL — ABNORMAL HIGH (ref 70–99)
Glucose-Capillary: 160 mg/dL — ABNORMAL HIGH (ref 70–99)
Glucose-Capillary: 197 mg/dL — ABNORMAL HIGH (ref 70–99)

## 2013-01-27 LAB — TROPONIN I: Troponin I: <0.3 ng/mL (ref <0.30)

## 2013-01-27 MED ORDER — POTASSIUM CHLORIDE CRYS ER 20 MEQ PO TBCR
40.0000 meq | EXTENDED_RELEASE_TABLET | Freq: Two times a day (BID) | ORAL | Status: AC
  Administered 2013-01-27 – 2013-01-28 (×2): 40 meq via ORAL
  Filled 2013-01-27 (×2): qty 2

## 2013-01-27 MED ORDER — INSULIN GLARGINE 100 UNIT/ML ~~LOC~~ SOLN
30.0000 [IU] | Freq: Once | SUBCUTANEOUS | Status: AC
  Administered 2013-01-27: 30 [IU] via SUBCUTANEOUS
  Filled 2013-01-27: qty 0.3

## 2013-01-27 MED ORDER — INSULIN ASPART 100 UNIT/ML ~~LOC~~ SOLN
0.0000 [IU] | Freq: Three times a day (TID) | SUBCUTANEOUS | Status: DC
  Administered 2013-01-28: 15 [IU] via SUBCUTANEOUS
  Administered 2013-01-28: 11 [IU] via SUBCUTANEOUS

## 2013-01-27 MED ORDER — INSULIN ASPART 100 UNIT/ML ~~LOC~~ SOLN
0.0000 [IU] | SUBCUTANEOUS | Status: DC
  Administered 2013-01-27: 4 [IU] via SUBCUTANEOUS
  Administered 2013-01-27: 5 [IU] via SUBCUTANEOUS
  Administered 2013-01-27: 4 [IU] via SUBCUTANEOUS

## 2013-01-27 MED ORDER — DEXTROSE-NACL 5-0.45 % IV SOLN
INTRAVENOUS | Status: DC
  Administered 2013-01-27: 75 mL/h via INTRAVENOUS

## 2013-01-27 MED ORDER — SODIUM CHLORIDE 0.9 % IV SOLN
INTRAVENOUS | Status: DC
  Administered 2013-01-27 – 2013-01-28 (×2): via INTRAVENOUS

## 2013-01-27 MED ORDER — ISOSORBIDE MONONITRATE ER 30 MG PO TB24
30.0000 mg | ORAL_TABLET | Freq: Every day | ORAL | Status: DC
  Administered 2013-01-27 – 2013-01-28 (×2): 30 mg via ORAL
  Filled 2013-01-27 (×2): qty 1

## 2013-01-27 NOTE — Progress Notes (Signed)
ANTICOAGULATION CONSULT NOTE Pharmacy Consult for Heparin Indication: chest pain/ACS  Allergies  Allergen Reactions  . Clarithromycin Itching  . Lisinopril Other (See Comments)    Increased creatinine levels (pt currently taking low dose)  . Metformin And Related Other (See Comments)    Increased creatinine levels    Patient Measurements: Height: 5\' 3"  (160 cm) Weight: 214 lb 4.6 oz (97.2 kg) IBW/kg (Calculated) : 52.4 Heparin Dosing Weight: 77 Kg  Vital Signs: Temp: 97.9 F (36.6 C) (06/18 0449) Temp src: Oral (06/18 0449) BP: 118/58 mmHg (06/18 0449) Pulse Rate: 76 (06/18 0449)  Labs:  Recent Labs  01/26/13 1205 01/26/13 1244 01/26/13 1727 01/26/13 2220 01/27/13 0550  HGB 11.7*  --   --   --  10.4*  HCT 36.3  --   --   --  31.8*  PLT 182  --   --   --  191  APTT  --  29  --   --   --   LABPROT  --  13.4  --   --   --   INR  --  1.03  --   --   --   HEPARINUNFRC  --   --   --   --  0.31  CREATININE 1.38*  --   --   --   --   TROPONINI  --   --  <0.30 <0.30  --     Estimated Creatinine Clearance: 41.5 ml/min (by C-G formula based on Cr of 1.38).  Assessment: 72 year old female with chest pain for heparin Goal of Therapy:  Heparin level 0.3-0.7 units/ml Monitor platelets by anticoagulation protocol: Yes   Plan:  Increase Heparin 1400 units/hr to keep in range F/U after stress test  Geannie Risen, PharmD, BCPS  01/27/2013,6:31 AM

## 2013-01-27 NOTE — Progress Notes (Signed)
[facility] Progress Note [name] ZOX:[id number] DOB: April 28, 1941 DOA: 01/26/2013 PCP: Gaye Alken, MD  Brief narrative: 72 year old patient with known CAD and recent MI with subsequent stent placement January 2014 after undergoing extensive surgery for bladder cancer at [facility]. Also has underlying chronic kidney disease stage III, diabetes, hypertension, and peripheral vascular disease status post right BKA. She had gone to see her nephrologist and began having typical chest pain associated with shortness of breath diaphoresis and radiation to the left arm while at the physician's office. She was given sublingual nitroglycerin with resolution of chest pain. She was transported to [place] where initial troponin was negative & EKG showed subtle ST segment depression in the inferior and septal leads. In addition her serum glucose was 644. Cardiology was consulted. She was started on IV insulin.  Assessment/Plan:  Chest pain/CAD (coronary artery disease) -reported with residual disease in several vessels and recent single vessel PTCA/stent -Cards following: plan ECHO to see if RWMA and Myoview in am- cath only if necessary given CKD -cont med rxn including Plavix and IV Heparin -repeat EKG in am  Type 2 diabetes mellitus with hyperosmolar nonketotic hyperglycemia -CBG better so transition to Lantus and SSI -Carb mod diet  HTN (hypertension) -BP controlled  CKD (chronic kidney disease) stage 3, GFR 30-59 ml/min -Scr stable and near baseline- BUN up due to [facility] from hyyperglycemia  Hypokalemia -oral replete  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 Denies shortness of breath.  Objective: Blood pressure 116/49, pulse 70, temperature 98.3 F (36.8 C), temperature source Oral, resp. rate 13, height 5\' 3"  (1.6 m), weight 97.2 kg (214 lb 4.6 oz), SpO2 97.00%.  Intake/Output Summary (Last 24 hours) at 01/27/13 1116 Last data filed at 01/27/13 1100  Gross per 24 hour  Intake 1084.25 ml  Output   1325 ml  Net -240.75 ml    Exam: General: No acute respiratory distress Lungs: Clear to auscultation bilaterally without wheezes or crackles, RA Cardiovascular: Regular rate and rhythm without murmur gallop or rub normal S1 and S2, no peripheral edema or JVD Abdomen: Nontender, nondistended, soft, bowel sounds positive, no rebound, no ascites, no appreciable mass Musculoskeletal: No significant cyanosis, clubbing of extremities-previous right BKA Neurological: Alert and oriented x 3, moves all extremities x 3 without focal neurological deficits, CN 2-12 intact  Scheduled Meds: Scheduled Meds: . aspirin  81 mg Oral q morning - 10a  . atorvastatin  80 mg Oral q1800  . cholecalciferol  5,000 Units Oral QHS  . clopidogrel  75 mg Oral Daily  . cyclobenzaprine  10 mg Oral Once  . ferrous sulfate  325 mg Oral QHS  . furosemide  40 mg Oral Daily  . heparin  4,000 Units Intravenous Once  . insulin aspart  0-20 Units Subcutaneous Q4H  . isosorbide mononitrate  30 mg Oral Daily  . metoprolol succinate  100 mg Oral QHS  . omega-3 acid ethyl esters  1 g Oral BID  . pantoprazole  40 mg Oral Q1200  . potassium chloride  40 mEq Oral BID  . senna  4 tablet Oral QHS  . sodium chloride  3 mL Intravenous Q12H   Continuous Infusions: . sodium chloride 75 mL/hr at 01/27/13 0919  . heparin 1,400 Units/hr (01/27/13 1100)     Data Reviewed: Basic Metabolic Panel:  Recent Labs Lab 01/26/13 1205 01/27/13 0550  NA 132* 136  K 3.8 2.9*  CL 94* 103  CO2 24 25  GLUCOSE 644* 161*  BUN 59* 52*  CREATININE 1.38* 1.31*  CALCIUM 10.1 9.9   CBC:  Recent Labs Lab 01/26/13 1205 01/27/13 0550  WBC 5.9 7.9  HGB 11.7* 10.4*  HCT 36.3 31.8*  MCV 78.4 75.7*  PLT 182 191   Cardiac Enzymes:  Recent Labs Lab 01/26/13 1727 01/26/13 2220 01/27/13 0550  TROPONINI <0.30 <0.30 <0.30   CBG:  Recent Labs Lab 01/26/13 2239 01/27/13 0009 01/27/13 0443 01/27/13 0621 01/27/13 0721  GLUCAP 153* 142* 159* 151* 160*    Recent Results (from the past 240 hour(s))  MRSA PCR SCREENING     Status: None   Collection Time    01/26/13  4:33 PM      Result Value Range Status   MRSA by PCR NEGATIVE  NEGATIVE Final   Comment:            The GeneXpert MRSA Assay (FDA     approved for NASAL specimens     only), is one component of a     comprehensive MRSA colonization     surveillance program. It is not     intended to diagnose MRSA     infection nor to guide or     monitor treatment for     MRSA infections.     Studies:  Recent x-ray studies have been reviewed in detail by the Attending Physician   Junious Silk, ANP [facility] Office  [phone] Pager [phone]  **If unable to reach the above provider after paging please contact the Flow Manager @ [phone]  On-Call/Text Page:      [email or web address]      password [email or web address]  If 7PM-7AM, please contact night-coverage [email or web address] Password [facility] 01/27/2013, 11:16 AM   LOS: 1 day   I have personally examined this patient and reviewed the entire database. I have reviewed the above note, made any necessary editorial changes, and agree with its content.  Lonia Blood, MD [facility]

## 2013-01-27 NOTE — Progress Notes (Signed)
Utilization Review Completed. 01/27/2013

## 2013-01-27 NOTE — Progress Notes (Signed)
Nutrition Brief Note  Malnutrition Screening Tool result is inaccurate.  Please consult if nutrition needs are identified.  Mikhala Kenan, MS RD LDN Clinical Inpatient Dietitian Pager: [phone] Weekend/After hours pager: [phone]   

## 2013-01-27 NOTE — Progress Notes (Signed)
  Echocardiogram 2D Echocardiogram has been performed.  [name] FRANCES 01/27/2013, 7:07 PM

## 2013-01-27 NOTE — Progress Notes (Signed)
Subjective:  She feels much better today. No residual chest pain, no shortness of breath, sitting up, hungry. She states that she did forget her insulin previously.  She tells me of 2 separate episodes of chest discomfort. At one time she had this while driving, squeezing-like sensation. This episode was described as a pressure/squeezing in the center of her chest with some left arm discomfort as well. She was concerned. When she took her nitroglycerin, she stated that it subsided.  Objective:  Vital Signs in the last 24 hours: Temp:  [97.6 F (36.4 C)-98.6 F (37 C)] 98.3 F (36.8 C) (06/18 0730) Pulse Rate:  [70-134] 70 (06/18 0730) Resp:  [10-19] 13 (06/18 0730) BP: (116-172)/(49-87) 116/49 mmHg (06/18 0730) SpO2:  [96 %-100 %] 97 % (06/18 0730) Weight:  [97.2 kg (214 lb 4.6 oz)] 97.2 kg (214 lb 4.6 oz) (06/18 0449)  Intake/Output from previous day: 06/17 0701 - 06/18 0700 In: 63 [I.V.:63] Out: 1325 [Urine:1325]   Physical Exam: General: Well developed, well nourished, in no acute distress. Head:  Normocephalic and atraumatic. Lungs: Clear to auscultation and percussion. Heart: Normal S1 and S2. Occasional ectopy. No murmur, rubs or gallops.  Abdomen: soft, non-tender, positive bowel sounds. Obese Extremities: No clubbing or cyanosis. No edema. Prior amputation/prosthesis noted Neurologic: Alert and oriented x 3.    Lab Results:  Recent Labs  01/26/13 1205 01/27/13 0550  WBC 5.9 7.9  HGB 11.7* 10.4*  PLT 182 191    Recent Labs  01/26/13 1205 01/27/13 0550  NA 132* 136  K 3.8 2.9*  CL 94* 103  CO2 24 25  GLUCOSE 644* 161*  BUN 59* 52*  CREATININE 1.38* 1.31*    Recent Labs  01/26/13 2220 01/27/13 0550  TROPONINI <0.30 <0.30    Imaging: Dg Chest Port 1 View  01/26/2013   *RADIOLOGY REPORT*  Clinical Data: Chest pain.  PORTABLE CHEST - 1 VIEW  Comparison: 11/14/2012.  Findings: The cardiac silhouette, mediastinal and hilar contours are within normal  limits and stable.  There is tortuosity and calcification of the thoracic aorta.  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 No acute infiltrates, edema or effusions.  IMPRESSION: Chronic basilar scarring changes but no acute pulmonary findings.   Original Report Authenticated By: Rudie Meyer, M.D.    Telemetry: Sinus rhythm, rare PVCs. No evidence of ventricular tachycardia (artifact). Personally viewed.   EKG:  ST segment depression noted on EKG during tachycardia.  Cardiac Studies:  Awaiting echocardiogram.  Cardiac catheterization reviewed from [facility]-February of 2014- 20-30% distal left main Hazy ostium of the LAD, 40% mid LAD 20-30% ostial circumflex Tandem 70-90% proximal proximal left circumflex-stented lesion Residual 70% obtuse marginal, inferior branch  30-40% mid RCA stenosis.  Assessment/Plan:   72 year old with prior non-ST elevation myocardial infarction postoperatively at [facility] with drug-eluting stent in circumflex artery placed at that time with residual 70% obtuse marginal and mild lesions as described above here with abnormal EKG, ST segment depression, tachycardia, uncontrolled diabetes, anginal symptoms currently resolved.  1. Angina-currently reassuring troponin. Tachycardia has resolved. Glucose improved. Hypokalemia noted and I have written for potassium 40 mEq x2 doses, renal insufficiency noted. Potassium has likely been driven into the cells due to insulin.  Plan today is to obtain an echocardiogram to evaluate for any evidence of wall motion abnormality. Replete electrolytes. Ensure proper stability of glucose.  Tomorrow, I will write for her to obtain a nuclear stress test to evaluate for ischemia  especially focusing on the lateral wall distribution or her previous 70% lesion was noted. We discussed the risks and benefits of cardiac catheterization in light of her renal issues, risk of  contrast-induced nephropathy. We will start off by evaluating for any evidence of significant ischemia.  I will also add isosorbide 30 mg once a day to her drug regimen. I will discontinue nitroglycerin patch. She is already on beta blocker.  Continue with aspirin, Plavix.  2. Diabetes-per primary team. Resolution of hyperglycemia noted.  3. Hypokalemia-I have written for potassium as above.  4. Coronary artery disease-as described above  5. Obesity-encourage weight loss.    Charolett Yarrow 01/27/2013, 9:03 AM

## 2013-01-28 ENCOUNTER — Inpatient Hospital Stay (HOSPITAL_COMMUNITY): Payer: Medicare Other

## 2013-01-28 DIAGNOSIS — R9431 Abnormal electrocardiogram [ECG] [EKG]: Secondary | ICD-10-CM

## 2013-01-28 LAB — CBC
MCH: 25.7 pg — ABNORMAL LOW (ref 26.0–34.0)
MCV: 77.6 fL — ABNORMAL LOW (ref 78.0–100.0)
Platelets: 162 10*3/uL (ref 150–400)
RBC: 4.01 MIL/uL (ref 3.87–5.11)
RDW: 16.7 % — ABNORMAL HIGH (ref 11.5–15.5)

## 2013-01-28 LAB — BASIC METABOLIC PANEL
Calcium: 9.3 mg/dL (ref 8.4–10.5)
Creatinine, Ser: 1.3 mg/dL — ABNORMAL HIGH (ref 0.50–1.10)
GFR calc Af Amer: 47 mL/min — ABNORMAL LOW (ref >90)
GFR calc non Af Amer: 40 mL/min — ABNORMAL LOW (ref >90)
Sodium: 134 mEq/L — ABNORMAL LOW (ref 135–145)

## 2013-01-28 LAB — GLUCOSE, CAPILLARY
Glucose-Capillary: 266 mg/dL — ABNORMAL HIGH (ref 70–99)
Glucose-Capillary: 290 mg/dL — ABNORMAL HIGH (ref 70–99)

## 2013-01-28 MED ORDER — INSULIN GLARGINE 100 UNIT/ML ~~LOC~~ SOLN
30.0000 [IU] | Freq: Two times a day (BID) | SUBCUTANEOUS | Status: DC
  Administered 2013-01-28: 30 [IU] via SUBCUTANEOUS
  Filled 2013-01-28 (×2): qty 0.3

## 2013-01-28 MED ORDER — INSULIN GLARGINE 100 UNIT/ML ~~LOC~~ SOLN
30.0000 [IU] | Freq: Every day | SUBCUTANEOUS | Status: DC
  Filled 2013-01-28: qty 0.3

## 2013-01-28 MED ORDER — ISOSORBIDE MONONITRATE ER 30 MG PO TB24: 30.0000 mg | ORAL_TABLET | Freq: Every day | ORAL | Status: DC

## 2013-01-28 MED ORDER — MELATONIN 10 MG PO TABS: 1.0000 | ORAL_TABLET | Freq: Every day | ORAL | Status: DC

## 2013-01-28 MED ORDER — TECHNETIUM TC 99M SESTAMIBI - CARDIOLITE: 10.0000 | Freq: Once | INTRAVENOUS | Status: DC | PRN

## 2013-01-28 MED ORDER — REGADENOSON 0.4 MG/5ML IV SOLN
0.4000 mg | Freq: Once | INTRAVENOUS | Status: AC
  Administered 2013-01-28: 0.4 mg via INTRAVENOUS
  Filled 2013-01-28: qty 5

## 2013-01-28 MED ORDER — TECHNETIUM TC 99M SESTAMIBI - CARDIOLITE
30.0000 | Freq: Once | INTRAVENOUS | Status: AC | PRN
  Administered 2013-01-28: 30 via INTRAVENOUS

## 2013-01-28 MED ORDER — TECHNETIUM TC 99M SESTAMIBI GENERIC - CARDIOLITE
10.0000 | Freq: Once | INTRAVENOUS | Status: AC | PRN
  Administered 2013-01-28: 10 via INTRAVENOUS

## 2013-01-28 NOTE — Progress Notes (Signed)
ANTICOAGULATION CONSULT NOTE Pharmacy Consult for Heparin Indication: chest pain/ACS  Allergies  Allergen Reactions  . Clarithromycin Itching  . Lisinopril Other (See Comments)    Increased creatinine levels (pt currently taking low dose)  . Metformin And Related Other (See Comments)    Increased creatinine levels    Patient Measurements: Height: 5\' 3"  (160 cm) Weight: 214 lb 4.6 oz (97.2 kg) IBW/kg (Calculated) : 52.4 Heparin Dosing Weight: 77 Kg  Vital Signs: Temp: 98.1 F (36.7 C) (06/19 1417) Temp src: Oral (06/19 1417) BP: 157/69 mmHg (06/19 1417) Pulse Rate: 73 (06/19 1417)  Labs:  Recent Labs  01/26/13 1205 01/26/13 1244 01/26/13 1727 01/26/13 2220 01/27/13 0550 01/28/13 0440  HGB 11.7*  --   --   --  10.4* 10.3*  HCT 36.3  --   --   --  31.8* 31.1*  PLT 182  --   --   --  191 162  APTT  --  29  --   --   --   --   LABPROT  --  13.4  --   --   --   --   INR  --  1.03  --   --   --   --   HEPARINUNFRC  --   --   --   --  0.31 0.62  CREATININE 1.38*  --   --   --  1.31* 1.30*  TROPONINI  --   --  <0.30 <0.30 <0.30  --     Estimated Creatinine Clearance: 44.1 ml/min (by C-G formula based on Cr of 1.3).  Assessment: 72 year old female with Hx  CAD and stent at [facility] in January 14 presents with chest pain.  Heparin drip 1400 uts/hr HL 0.62 big jump from yesterday with small dose change.  No bleeding noted, CBC stable. Goal of Therapy:  Heparin level 0.3-0.7 units/ml Monitor platelets by anticoagulation protocol: Yes   Plan:  Decrease Heparin 1350 units/hr  Daily HL, cbc  Leota Sauers Pharm.D. CPP, BCPS Clinical Pharmacist [phone] 01/28/2013 2:35 PM

## 2013-01-28 NOTE — Progress Notes (Signed)
Discharged to home with family office visits in place teaching done  

## 2013-01-28 NOTE — Discharge Summary (Signed)
Physician Discharge Summary  Patient ID: [name] MRN: [id number] DOB/AGE: May 08, 1941 72 y.o.  Admit date: 01/26/2013 Discharge date: 01/28/2013  Primary Care Physician:  Gaye Alken, MD  Discharge Diagnoses:    . Chest pain- resolved . CAD (coronary artery disease) . Sinus tachycardia . CKD (chronic kidney disease) stage 3, GFR 30-59 ml/min . Type 2 diabetes mellitus with hyperosmolar nonketotic hyperglycemia . HTN (hypertension) . 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 Dehydration . Hypokalemia  Consults:Cardiology- [place], Dr Anne Fu    Recommendations for Outpatient Follow-up:  1) Patient will likely need endocrinology referral for her uncontrolled diabetes 2) Dr. Anne Fu to follow in cardiology office in 2 weeks  Allergies:   Allergies  Allergen Reactions  . Clarithromycin Itching  . Lisinopril Other (See Comments)    Increased creatinine levels (pt currently taking low dose)  . Metformin And Related Other (See Comments)    Increased creatinine levels     Discharge Medications:   Medication List    TAKE these medications       acetaminophen 500 MG tablet  Commonly known as:  TYLENOL  Take 1,000 mg by mouth as needed for pain.     aspirin 81 MG chewable tablet  Chew 81 mg by mouth every morning.     atorvastatin 80 MG tablet  Commonly known as:  LIPITOR  Take 80 mg by mouth every evening.     cholecalciferol 1000 UNITS tablet  Commonly known as:  VITAMIN D  Take 5,000 Units by mouth every evening.     clopidogrel 75 MG tablet  Commonly known as:  PLAVIX  Take 75 mg by mouth daily.     ferrous sulfate 325 (65 FE) MG tablet  Take 325 mg by mouth daily.     fish oil-omega-3 fatty acids 1000 MG capsule  Take 1 g by mouth every evening.     HYDROcodone-acetaminophen 10-325 MG per tablet  Commonly known as:  NORCO  Take 1 tablet by mouth every 8 (eight) hours as needed for pain.     insulin glargine 100  UNIT/ML injection  Commonly known as:  LANTUS  Inject 30-80 Units into the skin 2 (two) times daily. 55 units in the morning and 60 units at night     insulin lispro 100 UNIT/ML injection  Commonly known as:  HUMALOG  Inject 20-50 Units into the skin daily as needed for high blood sugar. PER SLIDING SCALE     isosorbide mononitrate 30 MG 24 hr tablet  Commonly known as:  IMDUR  Take 1 tablet (30 mg total) by mouth daily.     Melatonin 10 MG Tabs  Take 1 tablet by mouth at bedtime.     metoprolol succinate 100 MG 24 hr tablet  Commonly known as:  TOPROL-XL  Take 100 mg by mouth every evening. Take with or immediately following a meal.     nitroGLYCERIN 0.4 MG SL tablet  Commonly known as:  NITROSTAT  Place 0.4 mg under the tongue every 5 (five) minutes as needed for chest pain.     omeprazole 20 MG capsule  Commonly known as:  PRILOSEC  Take 20 mg by mouth daily as needed (gurd).     PRESERVISION AREDS PO  Take 1 capsule by mouth 2 (two) times daily.     senna 8.6 MG Tabs  Commonly known as:  SENOKOT  Take 4 tablets by mouth at bedtime. "Swiss Kriss Tabs"-active ingredient=Senna     SODIUM BICARBONATE  PO  Take 20 g by mouth 2 (two) times daily.         Brief H and P: For complete details please refer to admission H and P, but in brief, patient is a 72 year old female with history of CAD  with stent S/P MI in January 2014 2 days following surgery for bladder cancer at [facility], uncontrolled DM, hypertension, hyperlipidemia peripheral vascular disease, GERD, Renal insufficiency presented with chest pain. Chest pain are worsened-up to about 8-9/10 in intensity, with radiation to her left arm and associated with shortness of breath, and she felt cold and clammy. She denied nausea or vomiting. She alerted the staff at the Dr. Deterding's office where she was there for her regular appointment and was given sublingual nitroglycerin and within about 2 minutes, her pain resolved.  Patient  states that the pain lasted a total of about 30 minutes. She states that when she had the cardiac cath at [facility] in January 2014, and a stent was placed, she was told that she had 3 other blockages about 70%.  Upon arrival in the ED patient had a Troponin done which was negative, blood glucose on Bmet was 644. Cardiology was consulted, she was started on IV insulin per glucose stabilizer and admitted to medicine service for further evaluation and management. Pt sates she missed insulin doses last pm and the morning of admission.   Hospital Course:  72 year old patient with known CAD and recent MI with subsequent stent placement January 2014 after undergoing extensive surgery for bladder cancer at [facility]. Also has underlying chronic kidney disease stage III, diabetes, hypertension, and peripheral vascular disease status post right BKA. She had gone to see her nephrologist and began having typical chest pain associated with shortness of breath diaphoresis and radiation to the left arm while at the physician's office. She was given sublingual nitroglycerin with resolution of chest pain. She was transported to [place] where initial troponin was negative & EKG showed subtle ST segment depression in the inferior and septal leads. In addition her serum glucose was 644. Cardiology was consulted. She was started on IV insulin and was admitted to step down unit Chest pain/CAD (coronary artery disease)  -reported with residual disease in several vessels and recent single vessel PTCA/stent. Cardiology was consulted and patient was started on IV heparin. 2D Echo showed EF of 55-60%, no regional wall motion abnormalities Patient was continued on aspirin, Plavix, statin and beta blocker. She underwent nuclear medicine stress test which showed normal wall motion, EF of 57%, no definitive evidence of prior infarction or pharmacological induced ischemia. Patient had a relatively-large area of attenuation involving  the lateral wall extending towards the left ventricular apex without associated wall abnormality. I discussed in detail with Dr. Anne Fu who has been following the patient regarding the nuclear medicine stress test. Per Dr. scans the large area of attenuation involving the lateral wall and left ventricular apex is from her prior NSTEMI. Cardiology recommendations are to maximize her medical management and Dr. Dione Housekeeper will follow her in the office in 2 weeks  -Type 2 diabetes mellitus with hyperosmolar nonketotic hyperglycemia: Resolved. Patient was initially started on IV glucose stabilizer and subsequently transitioned to Lantus and sliding scale insulin. She had missed 2 doses of Lantus prior to the admission which likely caused hyperglycemia. Recommended her strongly to keep a log of her blood sugars and discuss it with her primary care physician. She may need endocrinology referral for better control of diabetes.  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     Day of Discharge BP 157/69  Pulse 73  Temp(Src) 98.1 F (36.7 C) (Oral)  Resp 16  Ht 5\' 3"  (1.6 m)  Wt 97.2 kg (214 lb 4.6 oz)  BMI 37.97 kg/m2  SpO2 99%  Physical Exam: General: Alert and awake oriented x3 not in any acute distress. HEENT: anicteric sclera, pupils reactive to light and accommodation CVS: S1-S2 clear no murmur rubs or gallops Chest: clear to auscultation bilaterally, no wheezing rales or rhonchi Abdomen: soft nontender, nondistended, normal bowel sounds, no organomegaly Extremities: no cyanosis, clubbing or edema noted bilaterally Neuro: Cranial nerves II-XII intact, no focal neurological deficits   The  results of significant diagnostics from this hospitalization (including imaging, microbiology, ancillary and laboratory) are listed below for reference.    LAB RESULTS: Basic Metabolic Panel:  Recent Labs Lab 01/27/13 0550 01/28/13 0440  NA 136 134*  K 2.9* 4.4  CL 103 103  CO2 25 21  GLUCOSE 161* 342*  BUN 52* 45*  CREATININE 1.31* 1.30*  CALCIUM 9.9 9.3   Liver Function Tests: No results found for this basename: AST, ALT, ALKPHOS, BILITOT, PROT, ALBUMIN,  in the last 168 hours No results found for this basename: LIPASE, AMYLASE,  in the last 168 hours No results found for this basename: AMMONIA,  in the last 168 hours CBC:  Recent Labs Lab 01/27/13 0550 01/28/13 0440  WBC 7.9 7.0  HGB 10.4* 10.3*  HCT 31.8* 31.1*  MCV 75.7* 77.6*  PLT 191 162   Cardiac Enzymes:  Recent Labs Lab 01/26/13 2220 01/27/13 0550  TROPONINI <0.30 <0.30   BNP: No components found with this basename: POCBNP,  CBG:  Recent Labs Lab 01/28/13 0656 01/28/13 1123  GLUCAP 329* 266*    Significant Diagnostic Studies:  Dg Chest Port 1 View  01/26/2013   *RADIOLOGY REPORT*  Clinical Data: Chest pain.  PORTABLE CHEST - 1 VIEW  Comparison: 11/14/2012.  Findings: The cardiac silhouette, mediastinal and hilar contours are within normal limits and stable.  There is tortuosity and calcification of the thoracic aorta.  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 No acute infiltrates, edema or effusions.  IMPRESSION: Chronic basilar scarring changes but no acute pulmonary findings.   Original Report Authenticated By: Rudie Meyer, M.D.    2D ECHO: Study Conclusions  - Left ventricle: The cavity size was normal. Systolic function was normal. The estimated ejection fraction was in the range of 55% to 60%. Wall motion was normal; there were no regional wall motion abnormalities. - Mitral valve: Mild regurgitation. - Right ventricle: The cavity size  was mildly dilated. Wall thickness was normal. - Right atrium: The atrium was mildly dilated.    Disposition and Follow-up:     Discharge Orders   Future Orders Complete By Expires     Diet Carb Modified  As directed     Increase activity slowly  As directed         DISPOSITION: Home   DIET: carb modified diet  ACTIVITY: as tolerated  DISCHARGE FOLLOW-UP Follow-up Information   Follow up with Gaye Alken, MD. Schedule an appointment as soon as possible for a visit in 10  days. (for hospital follow-up and about your diabetes)    Contact information:   [address] [place] [address] [phone]       Follow up with Donato Schultz, MD. Schedule an appointment as soon as possible for a visit in 2 weeks. (please call office tomorrow am to confirm appointment)    Contact information:   [address] [place] [address] [phone]       Time spent on Discharge: 45 mins  Signed:   Deette Revak M.D. [facility] 01/28/2013, 3:10 PM Pager: [phone]

## 2013-01-28 NOTE — Progress Notes (Signed)
PHARMACIST - PHYSICIAN ORDER COMMUNICATION  CONCERNING: P&T Medication Policy on Herbal Medications  DESCRIPTION:  This patient's order for:  Melatonin 10mg    has been noted.  This product(s) is classified as an "herbal" or natural product. Due to a lack of definitive safety studies or FDA approval, nonstandard manufacturing practices, plus the potential risk of unknown drug-drug interactions while on inpatient medications, the Pharmacy and Therapeutics Committee does not permit the use of "herbal" or natural products of this type within [facility].   ACTION TAKEN: The pharmacy department is unable to verify this order at this time and your patient has been informed of this safety policy. Please reevaluate patient's clinical condition at discharge and address if the herbal or natural product(s) should be resumed at that time.  Janice Coffin [phone]

## 2013-01-28 NOTE — Progress Notes (Addendum)
Nuclear study completed and brief review of images reveal lateral scar with mild peri-infarct ischemia. Also suggestion of distal anterior and apical mild ischemia. Plan will be per Dr. Anne Fu with options to intensify meds and/or consider further intervention if appropriate anatomy.

## 2013-03-30 ENCOUNTER — Telehealth (HOSPITAL_COMMUNITY): Payer: Self-pay | Admitting: Cardiac Rehabilitation

## 2013-03-30 NOTE — Telephone Encounter (Signed)
Multiple phone calls to pt to enroll in cardiac rehab.  unfortuanately pt has had multiple medical issues this year.  Pt states she is feeling better at this time however she is not interested in participating in cardiac rehab.

## 2013-05-28 ENCOUNTER — Other Ambulatory Visit (HOSPITAL_COMMUNITY): Payer: Self-pay

## 2013-05-31 ENCOUNTER — Encounter (HOSPITAL_COMMUNITY): Payer: Medicare Other

## 2013-06-08 ENCOUNTER — Emergency Department (HOSPITAL_COMMUNITY): Payer: Medicare Other

## 2013-06-08 ENCOUNTER — Emergency Department (HOSPITAL_COMMUNITY)
Admission: EM | Admit: 2013-06-08 | Discharge: 2013-06-08 | Disposition: A | Discharge status: Home / Self Care | Payer: Medicare Other | Attending: Emergency Medicine | Admitting: Emergency Medicine

## 2013-06-08 ENCOUNTER — Encounter (HOSPITAL_COMMUNITY): Payer: Self-pay | Admitting: Emergency Medicine

## 2013-06-08 DIAGNOSIS — Z8551 Personal history of malignant neoplasm of bladder: Secondary | ICD-10-CM | POA: Insufficient documentation

## 2013-06-08 DIAGNOSIS — Z79899 Other long term (current) drug therapy: Secondary | ICD-10-CM | POA: Insufficient documentation

## 2013-06-08 DIAGNOSIS — Z87891 Personal history of nicotine dependence: Secondary | ICD-10-CM | POA: Insufficient documentation

## 2013-06-08 DIAGNOSIS — E11319 Type 2 diabetes mellitus with unspecified diabetic retinopathy without macular edema: Secondary | ICD-10-CM | POA: Insufficient documentation

## 2013-06-08 DIAGNOSIS — I2510 Atherosclerotic heart disease of native coronary artery without angina pectoris: Secondary | ICD-10-CM | POA: Insufficient documentation

## 2013-06-08 DIAGNOSIS — D649 Anemia, unspecified: Secondary | ICD-10-CM | POA: Insufficient documentation

## 2013-06-08 DIAGNOSIS — E785 Hyperlipidemia, unspecified: Secondary | ICD-10-CM | POA: Insufficient documentation

## 2013-06-08 DIAGNOSIS — K219 Gastro-esophageal reflux disease without esophagitis: Secondary | ICD-10-CM | POA: Insufficient documentation

## 2013-06-08 DIAGNOSIS — Z7982 Long term (current) use of aspirin: Secondary | ICD-10-CM | POA: Insufficient documentation

## 2013-06-08 DIAGNOSIS — Z794 Long term (current) use of insulin: Secondary | ICD-10-CM | POA: Insufficient documentation

## 2013-06-08 DIAGNOSIS — M129 Arthropathy, unspecified: Secondary | ICD-10-CM | POA: Insufficient documentation

## 2013-06-08 DIAGNOSIS — E876 Hypokalemia: Secondary | ICD-10-CM | POA: Insufficient documentation

## 2013-06-08 DIAGNOSIS — Z7902 Long term (current) use of antithrombotics/antiplatelets: Secondary | ICD-10-CM | POA: Insufficient documentation

## 2013-06-08 DIAGNOSIS — N390 Urinary tract infection, site not specified: Secondary | ICD-10-CM | POA: Diagnosis present

## 2013-06-08 DIAGNOSIS — Z9861 Coronary angioplasty status: Secondary | ICD-10-CM | POA: Insufficient documentation

## 2013-06-08 DIAGNOSIS — R110 Nausea: Secondary | ICD-10-CM | POA: Insufficient documentation

## 2013-06-08 DIAGNOSIS — I10 Essential (primary) hypertension: Secondary | ICD-10-CM | POA: Insufficient documentation

## 2013-06-08 DIAGNOSIS — K5900 Constipation, unspecified: Secondary | ICD-10-CM | POA: Diagnosis present

## 2013-06-08 DIAGNOSIS — E1139 Type 2 diabetes mellitus with other diabetic ophthalmic complication: Secondary | ICD-10-CM | POA: Insufficient documentation

## 2013-06-08 LAB — URINALYSIS W MICROSCOPIC + REFLEX CULTURE
Bilirubin Urine: NEGATIVE
Ketones, ur: NEGATIVE mg/dL
Nitrite: POSITIVE — AB
Protein, ur: 100 mg/dL — AB
Urobilinogen, UA: 0.2 mg/dL (ref 0.0–1.0)

## 2013-06-08 LAB — CBC WITH DIFFERENTIAL/PLATELET
Basophils Absolute: 0.1 10*3/uL (ref 0.0–0.1)
Basophils Relative: 0 % (ref 0–1)
Eosinophils Absolute: 0.1 10*3/uL (ref 0.0–0.7)
Eosinophils Relative: 1 % (ref 0–5)
Lymphs Abs: 1.2 10*3/uL (ref 0.7–4.0)
MCH: 27.4 pg (ref 26.0–34.0)
MCV: 76.9 fL — ABNORMAL LOW (ref 78.0–100.0)
Neutrophils Relative %: 86 % — ABNORMAL HIGH (ref 43–77)
Platelets: 254 10*3/uL (ref 150–400)
RBC: 5.36 MIL/uL — ABNORMAL HIGH (ref 3.87–5.11)
RDW: 14.4 % (ref 11.5–15.5)

## 2013-06-08 LAB — COMPREHENSIVE METABOLIC PANEL
ALT: 25 U/L (ref 0–35)
AST: 23 U/L (ref 0–37)
Albumin: 3.9 g/dL (ref 3.5–5.2)
Alkaline Phosphatase: 142 U/L — ABNORMAL HIGH (ref 39–117)
Calcium: 10.2 mg/dL (ref 8.4–10.5)
GFR calc Af Amer: 40 mL/min — ABNORMAL LOW (ref >90)
Potassium: 2.8 mEq/L — ABNORMAL LOW (ref 3.5–5.1)
Sodium: 126 mEq/L — ABNORMAL LOW (ref 135–145)
Total Protein: 8.1 g/dL (ref 6.0–8.3)

## 2013-06-08 LAB — LIPASE, BLOOD: Lipase: 189 U/L — ABNORMAL HIGH (ref 11–59)

## 2013-06-08 MED ORDER — IOHEXOL 300 MG/ML  SOLN
25.0000 mL | INTRAMUSCULAR | Status: AC
  Administered 2013-06-08: 25 mL via ORAL

## 2013-06-08 MED ORDER — ONDANSETRON HCL 4 MG/2ML IJ SOLN
INTRAMUSCULAR | Status: AC
  Filled 2013-06-08: qty 2

## 2013-06-08 MED ORDER — FENTANYL CITRATE 0.05 MG/ML IJ SOLN
25.0000 ug | Freq: Once | INTRAMUSCULAR | Status: AC
  Administered 2013-06-08: 25 ug via INTRAVENOUS
  Filled 2013-06-08: qty 2

## 2013-06-08 MED ORDER — DEXTROSE 5 % IV SOLN
1.0000 g | Freq: Once | INTRAVENOUS | Status: AC
  Administered 2013-06-08: 1 g via INTRAVENOUS
  Filled 2013-06-08: qty 10

## 2013-06-08 MED ORDER — INSULIN ASPART 100 UNIT/ML ~~LOC~~ SOLN
5.0000 [IU] | Freq: Once | SUBCUTANEOUS | Status: AC
  Administered 2013-06-08: 5 [IU] via INTRAVENOUS
  Filled 2013-06-08: qty 1

## 2013-06-08 MED ORDER — SODIUM CHLORIDE 0.9 % IV BOLUS (SEPSIS)
500.0000 mL | Freq: Once | INTRAVENOUS | Status: AC
  Administered 2013-06-08: 500 mL via INTRAVENOUS

## 2013-06-08 MED ORDER — ONDANSETRON HCL 4 MG/2ML IJ SOLN
4.0000 mg | Freq: Once | INTRAMUSCULAR | Status: AC
  Administered 2013-06-08: 4 mg via INTRAVENOUS
  Filled 2013-06-08: qty 2

## 2013-06-08 MED ORDER — IOHEXOL 300 MG/ML  SOLN
80.0000 mL | Freq: Once | INTRAMUSCULAR | Status: AC | PRN
  Administered 2013-06-08: 80 mL via INTRAVENOUS

## 2013-06-08 MED ORDER — POTASSIUM CHLORIDE CRYS ER 20 MEQ PO TBCR
40.0000 meq | EXTENDED_RELEASE_TABLET | Freq: Once | ORAL | Status: AC
  Administered 2013-06-08: 40 meq via ORAL
  Filled 2013-06-08: qty 2

## 2013-06-08 MED ORDER — CEFPODOXIME PROXETIL 200 MG PO TABS: 200.0000 mg | ORAL_TABLET | Freq: Two times a day (BID) | ORAL | Status: DC

## 2013-06-08 MED ORDER — POLYETHYLENE GLYCOL 3350 17 G PO PACK: 17.0000 g | PACK | Freq: Every day | ORAL | Status: DC

## 2013-06-08 MED ORDER — POTASSIUM CHLORIDE 10 MEQ/100ML IV SOLN
10.0000 meq | Freq: Once | INTRAVENOUS | Status: AC
  Administered 2013-06-08: 10 meq via INTRAVENOUS
  Filled 2013-06-08: qty 100

## 2013-06-08 MED ORDER — SODIUM CHLORIDE 0.9 % IV BOLUS (SEPSIS)
500.0000 mL | INTRAVENOUS | Status: AC
  Administered 2013-06-08: 500 mL via INTRAVENOUS

## 2013-06-08 NOTE — ED Notes (Addendum)
Pt up to bedside commode for another BM. EDP aware, holding enema at this time.

## 2013-06-08 NOTE — ED Notes (Signed)
Pt comfortable with d/c and f/u instructions. Prescriptions x2. 

## 2013-06-08 NOTE — ED Notes (Signed)
Prior to administering Soap suds enema, pt requested to use bedside commode.

## 2013-06-08 NOTE — ED Notes (Signed)
Pt reports she is impacted and has tried all night long to have a BM, reports she has had a little bit of watery stool but hasn't had a BM in 4 days. Reports she tried an enema at home 2 hrs ago, no relief. Pt reports she takes a vegetable laxative everyday. sts she had sx in January for a urostomy and reports that her doctor told her she might get constipated some. Pt in nad, skin warm and dry, resp e/u. Pt brought in by wheelchair.

## 2013-06-08 NOTE — ED Provider Notes (Signed)
CSN: [id number]     Arrival date & time 06/08/13  0744 History   First MD Initiated Contact with Patient 06/08/13 [phone]     No chief complaint on file.  (Consider location/radiation/quality/duration/timing/severity/associated sxs/prior Treatment) Patient is a 72 y.o. female presenting with abdominal pain. The history is provided by the patient.  Abdominal Pain Pain location:  Generalized Pain quality: cramping   Pain radiates to:  Does not radiate Pain severity:  Mild Onset quality:  Gradual Duration:  1 day Timing:  Intermittent Progression:  Waxing and waning Chronicity:  New Context comment:  Constipation Relieved by:  Nothing Worsened by:  Nothing tried Ineffective treatments: vegetable laxatives. Associated symptoms: nausea   Associated symptoms: no chest pain, no cough, no diarrhea, no dysuria, no fatigue, no fever, no hematuria, no shortness of breath and no vomiting     Past Medical History  Diagnosis Date  . Hypertension   . Hyperlipidemia   . History of atrial tachycardia CONTROLLED W/ LOPRESSOR  . Peripheral vascular disease S/P RIGHT BKA  . Headache(784.0)   . Peripheral neuropathy LEGS AND HANDS  . Arthritis   . S/P BKA (below knee amputation) unilateral RIGHT  . GERD (gastroesophageal reflux disease)   . Anemia   . Blood transfusion   . Diabetes mellitus ORAL AND INSULIN MEDS  . Macular degeneration of both eyes   . Glaucoma   . Retinopathy due to secondary diabetes mellitus   . Insomnia   . Constipation   . History of bladder cancer TCC AND CIS  . MI (mitral incompetence)   . Coronary artery disease    Past Surgical History  Procedure Laterality Date  . I & d right below knee amputation wound  06-10-2011  . Cysto / resection bladder bx's  04-01-11  &  11-26-10  . Below knee leg amputation  05-02-2007    RIGHT  . Right foot i & d / removal necrotic bone  JUN  &  AUG 2008  . Right great toe amputation  11-05-2006  . Laparoscopic cholecystectomy   12-10-1999  . Back surgery  1991   &  1971  . Appendectomy  1963  . Cataract extraction w/ intraocular lens  implant, bilateral    . Cystoscopy with biopsy  10/14/2011    Procedure: CYSTOSCOPY WITH BIOPSY;  Surgeon: Garnett Farm, MD;  Location: [facility];  Service: Urology;  Laterality: N/A;  BLADDER BIOPSY  . Coronary stent placement    . Abdominal hysterectomy    . Revision urostomy cutaneous    . Revision urostomy cutaneous     Family History  Problem Relation Age of Onset  . Heart failure Mother     died age 68  . Sudden death Father 10   History  Substance Use Topics  . Smoking status: Former Smoker -- 2.00 packs/day for 17 years    Types: Cigarettes    Quit date: 10/09/1975  . Smokeless tobacco: Never Used  . Alcohol Use: Yes     Comment: RARE   OB History   Grav Para Term Preterm Abortions TAB SAB Ect Mult Living                 Review of Systems  Constitutional: Negative for fever and fatigue.  HENT: Negative for congestion and drooling.   Eyes: Negative for pain.  Respiratory: Negative for cough and shortness of breath.   Cardiovascular: Negative for chest pain.  Gastrointestinal: Positive for nausea and abdominal pain. Negative  for vomiting and diarrhea.  Genitourinary: Negative for dysuria and hematuria.  Musculoskeletal: Negative for back pain, gait problem and neck pain.  Skin: Negative for color change.  Neurological: Negative for dizziness and headaches.  Hematological: Negative for adenopathy.  Psychiatric/Behavioral: Negative for behavioral problems.  All other systems reviewed and are negative.    Allergies  Clarithromycin; Lisinopril; and Metformin and related  Home Medications   Current Outpatient Rx  Name  Route  Sig  Dispense  Refill  . acetaminophen (TYLENOL) 500 MG tablet   Oral   Take 1,000 mg by mouth as needed for pain.         Marland Kitchen aspirin 81 MG chewable tablet   Oral   Chew 81 mg by mouth every morning.           Marland Kitchen atorvastatin (LIPITOR) 80 MG tablet   Oral   Take 80 mg by mouth every evening.          . cholecalciferol (VITAMIN D) 1000 UNITS tablet   Oral   Take 5,000 Units by mouth every evening.          . clopidogrel (PLAVIX) 75 MG tablet   Oral   Take 75 mg by mouth daily.         . ferrous sulfate 325 (65 FE) MG tablet   Oral   Take 325 mg by mouth daily.         . fish oil-omega-3 fatty acids 1000 MG capsule   Oral   Take 1 g by mouth every evening.         Marland Kitchen HYDROcodone-acetaminophen (NORCO) 10-325 MG per tablet   Oral   Take 1 tablet by mouth every 8 (eight) hours as needed for pain.   10 tablet   0   . insulin glargine (LANTUS) 100 UNIT/ML injection   Subcutaneous   Inject 30-80 Units into the skin 2 (two) times daily. 55 units in the morning and 60 units at night         . insulin lispro (HUMALOG) 100 UNIT/ML injection   Subcutaneous   Inject 20-50 Units into the skin daily as needed for high blood sugar. PER SLIDING SCALE         . isosorbide mononitrate (IMDUR) 30 MG 24 hr tablet   Oral   Take 1 tablet (30 mg total) by mouth daily.   30 tablet   3   . Melatonin 10 MG TABS   Oral   Take 1 tablet by mouth at bedtime.         . metoprolol succinate (TOPROL-XL) 100 MG 24 hr tablet   Oral   Take 100 mg by mouth every evening. Take with or immediately following a meal.         . Multiple Vitamins-Minerals (PRESERVISION AREDS PO)   Oral   Take 1 capsule by mouth 2 (two) times daily.         . nitroGLYCERIN (NITROSTAT) 0.4 MG SL tablet   Sublingual   Place 0.4 mg under the tongue every 5 (five) minutes as needed for chest pain.         Marland Kitchen omeprazole (PRILOSEC) 20 MG capsule   Oral   Take 20 mg by mouth daily as needed (gurd).          . senna (SENOKOT) 8.6 MG TABS   Oral   Take 4 tablets by mouth at bedtime. "Swiss Kriss Tabs"-active ingredient=Senna         .  SODIUM BICARBONATE PO   Oral   Take 20 g by mouth 2 (two) times  daily.          BP 119/83  Pulse 95  Temp(Src) 97.5 F (36.4 C) (Oral)  Resp 16  SpO2 98% Physical Exam  Nursing note and vitals reviewed. Constitutional: She is oriented to person, place, and time. She appears well-developed and well-nourished.  HENT:  Head: Normocephalic.  Mouth/Throat: Oropharynx is clear and moist. No oropharyngeal exudate.  Eyes: Conjunctivae and EOM are normal. Pupils are equal, round, and reactive to light.  Neck: Normal range of motion. Neck supple.  Cardiovascular: Normal rate, regular rhythm, normal heart sounds and intact distal pulses.  Exam reveals no gallop and no friction rub.   No murmur heard. Pulmonary/Chest: Effort normal and breath sounds normal. No respiratory distress. She has no wheezes.  Abdominal: Soft. Bowel sounds are normal. She exhibits distension (mild). There is tenderness (mild diffuse non-focal ttp). There is no rebound and no guarding.  Ileostomy site w/ clear appearing urine. No evidence of surrounding infection.   Genitourinary:  Mildly inflamed external hemorrhoids on rectal exam. Small amount of medium/soft stool in the rectal vault. Brown stool. No blood seen.   Musculoskeletal: Normal range of motion. She exhibits no edema and no tenderness.  Neurological: She is alert and oriented to person, place, and time.  Skin: Skin is warm and dry.  Psychiatric: She has a normal mood and affect. Her behavior is normal.    ED Course  Fecal disimpaction Date/Time: 06/09/2013 9:21 AM Performed by: Purvis Sheffield, S Authorized by: Purvis Sheffield, S Consent: Verbal consent obtained. written consent not obtained. Risks and benefits: risks, benefits and alternatives were discussed Consent given by: patient Patient understanding: patient states understanding of the procedure being performed Required items: required blood products, implants, devices, and special equipment available Patient identity confirmed: verbally with patient,  arm band, provided demographic data and hospital-assigned identification number Time out: Immediately prior to procedure a "time out" was called to verify the correct patient, procedure, equipment, support staff and site/side marked as required. Preparation: Patient was prepped and draped in the usual sterile fashion. Local anesthesia used: no Patient sedated: no Patient tolerance: Patient tolerated the procedure well with no immediate complications.   (including critical care time) Labs Review Labs Reviewed  CBC WITH DIFFERENTIAL - Abnormal; Notable for the following:    WBC 14.6 (*)    RBC 5.36 (*)    MCV 76.9 (*)    Neutrophils Relative % 86 (*)    Neutro Abs 12.5 (*)    Lymphocytes Relative 8 (*)    All other components within normal limits  COMPREHENSIVE METABOLIC PANEL - Abnormal; Notable for the following:    Sodium 126 (*)    Potassium 2.8 (*)    Chloride 90 (*)    Glucose, Bld 548 (*)    BUN 81 (*)    Creatinine, Ser 1.47 (*)    Alkaline Phosphatase 142 (*)    GFR calc non Af Amer 35 (*)    GFR calc Af Amer 40 (*)    All other components within normal limits  LIPASE, BLOOD - Abnormal; Notable for the following:    Lipase 189 (*)    All other components within normal limits  URINALYSIS W MICROSCOPIC + REFLEX CULTURE - Abnormal; Notable for the following:    APPearance TURBID (*)    Glucose, UA >1000 (*)    Hgb urine dipstick MODERATE (*)  Protein, ur 100 (*)    Nitrite POSITIVE (*)    Leukocytes, UA MODERATE (*)    Bacteria, UA MANY (*)    All other components within normal limits  URINE CULTURE   Imaging Review Ct Abdomen Pelvis W Contrast  06/08/2013   CLINICAL DATA:  Constipation  EXAM: CT ABDOMEN AND PELVIS WITH CONTRAST  TECHNIQUE: Multidetector CT imaging of the abdomen and pelvis was performed using the standard protocol following bolus administration of intravenous contrast.  CONTRAST:  80mL OMNIPAQUE IOHEXOL 300 MG/ML  SOLN  COMPARISON:  None.   FINDINGS: There is a segmental branching nodular pattern in the right lower lobe which suggests acute or chronic infection.  No pericardial fluid. No focal hepatic lesion. Post cholecystectomy. The pancreas, spleen, adrenal glands, and kidneys are normal. No evidence of hydronephrosis. This in an ileal conduit in the right lower quadrant. The urostomy exits the right mid abdomen without complicating features.  The stomach and small bowel are normal. No evidence of bowel dilatation. There is a small bowel anastomosis in the right abdomen without evidence obstruction. There is fluid stool in the ascending, transverse and descending colon. Stool ball in the rectum which measures 5.5 x 8.0 by 11 cm.  Abdominal aorta is normal in caliber. No retroperitoneal or periportal lymphadenopathy. No pelvic adenopathy. Post hysterectomy anatomy. Post bladder resection.  IMPRESSION: 1. Moderate to large stool ball within the rectum. Proximal to this stool ball there is fluid stool consists with recent enemas. 2. 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 3. Acute versus chronic pulmonary infection in the right lower lobe.   Electronically Signed   By: Genevive Bi M.D.   On: 06/08/2013 11:38    EKG Interpretation     Ventricular Rate:  91 PR Interval:  159 QRS Duration: 101 QT Interval:  399 QTC Calculation: 491 R Axis:   79 Text Interpretation:  Sinus rhythm Ventricular premature complex Non-specific ST depressions in precordial leads which are not changed significantly from previous tracing            MDM   1. Constipation   2. UTI (lower urinary tract infection)   3. Hypokalemia    8:08 AM 72 y.o. female with a history of multiple abdominal surgeries who presents with constipation for 4 days. The patient notes that she's been taking laxatives since then without relief. She notes mild intermittent cramping abdominal pain which began yesterday evening and persisted throughout the night. She denies any  vomiting but has had nausea. She is afebrile and vital signs are unremarkable here. Rectal exam showed a small amount of soft to medium consistency stool in the rectal vault. Stool is brown in color. The patient had mildly inflamed hemorrhoids.Will get IVF, fentanyl for pain, CT abd, labs.   4:12 PM: Imaging shows stool ball. I performed a rectal disimpaction and then a soap suds enema was performed. Pt had several large bm's, now feeling much better. The pt's UA is cw UTI, but this is urine from a urostomy pouch and is likely contaminated. Will give rocephin x 1 and tx w/ vantin regardless d/t abd pain and elev wbc although I suspect her sx are d/t constipation. The pt was also found to have hypokalemia, got 10 meq potassium IV and 40 meq po here. She has known hypokalemia and is on po supplementation at home. Mild hyponatremia found here as well. Will rec close f/u w/ pcp for recheck of this. Non specific elev of lipase, no evidence of pancreatitis on  CT.  Elevated BS, got IVF and insulin IV here, normal Anion gap. Pt states she did take her insulin today.  I have discussed the diagnosis/risks/treatment options with the patient and believe the pt to be eligible for discharge home to follow-up with pcp in 2-3 days. We also discussed returning to the ED immediately if new or worsening sx occur. We discussed the sx which are most concerning (e.g., abd pain, fever, vomiting) that necessitate immediate return. Any new prescriptions provided to the patient are listed below.  Discharge Medication List as of 06/08/2013  4:14 PM    START taking these medications   Details  cefpodoxime (VANTIN) 200 MG tablet Take 1 tablet (200 mg total) by mouth 2 (two) times daily., Starting 06/08/2013, Until Discontinued, Print    polyethylene glycol (MIRALAX / GLYCOLAX) packet Take 17 g by mouth daily., Starting 06/08/2013, Until Discontinued, Print         Junius Argyle, MD 06/09/13 [phone]

## 2013-06-08 NOTE — ED Notes (Signed)
Pt finished drinking oral CT contrast, will notify CT tech.

## 2013-06-08 NOTE — ED Notes (Signed)
Assisted Romeo Apple, MD with attempted depaction of fecal matter with little success

## 2013-06-08 NOTE — ED Notes (Signed)
Pt had medium BM with multiple solid stool and approx 50mL liquid stool

## 2013-06-08 NOTE — ED Notes (Signed)
Soap Suds enema ordered

## 2013-06-08 NOTE — ED Notes (Signed)
Pt c/o increased nausea and pain at this time. Dr. Romeo Apple made aware, states to give Fentanyl and Zofran that was previously held.

## 2013-06-09 LAB — URINE CULTURE: Colony Count: >=100000

## 2013-08-24 ENCOUNTER — Ambulatory Visit (INDEPENDENT_AMBULATORY_CARE_PROVIDER_SITE_OTHER): Payer: Medicare HMO | Admitting: Cardiology

## 2013-08-24 ENCOUNTER — Encounter: Payer: Self-pay | Admitting: Cardiology

## 2013-08-24 VITALS — BP 151/75 | HR 67 | Ht 63.0 in | Wt 211.0 lb

## 2013-08-24 DIAGNOSIS — Z89611 Acquired absence of right leg above knee: Secondary | ICD-10-CM | POA: Insufficient documentation

## 2013-08-24 DIAGNOSIS — E1159 Type 2 diabetes mellitus with other circulatory complications: Secondary | ICD-10-CM

## 2013-08-24 DIAGNOSIS — E669 Obesity, unspecified: Secondary | ICD-10-CM

## 2013-08-24 DIAGNOSIS — E1151 Type 2 diabetes mellitus with diabetic peripheral angiopathy without gangrene: Secondary | ICD-10-CM

## 2013-08-24 DIAGNOSIS — I739 Peripheral vascular disease, unspecified: Secondary | ICD-10-CM

## 2013-08-24 DIAGNOSIS — I2510 Atherosclerotic heart disease of native coronary artery without angina pectoris: Secondary | ICD-10-CM

## 2013-08-24 DIAGNOSIS — I252 Old myocardial infarction: Secondary | ICD-10-CM

## 2013-08-24 DIAGNOSIS — M25519 Pain in unspecified shoulder: Secondary | ICD-10-CM

## 2013-08-24 DIAGNOSIS — I798 Other disorders of arteries, arterioles and capillaries in diseases classified elsewhere: Secondary | ICD-10-CM

## 2013-08-24 DIAGNOSIS — M25512 Pain in left shoulder: Secondary | ICD-10-CM

## 2013-08-24 DIAGNOSIS — S78119A Complete traumatic amputation at level between unspecified hip and knee, initial encounter: Secondary | ICD-10-CM

## 2013-08-24 NOTE — Patient Instructions (Signed)
Your physician recommends that you continue on your current medications as directed. Please refer to the Current Medication list given to you today.  Your physician wants you to follow-up in: 6 months follow-up with Dr. Skains. You will receive a reminder letter in the mail two months in advance. If you don't receive a letter, please call our office to schedule the follow-up appointment.  

## 2013-08-24 NOTE — Progress Notes (Signed)
[name] [address], [address], [place]  [address] Phone: [phone] Fax:  [phone]  Date:  08/24/2013   ID:  [name], DOB Jul 08, 1941, MRN [id number]  PCP:  Gerrit Heck, MD   History of Present Illness: [name] is a 73 y.o. female with coronary artery disease status post MI in January of 2014 following bladder surgery at [facility], uncontrolled diabetes, hypertension, hyperlipidemia, peripheral vascular disease, chronic kidney disease here for followup. Prior hospitalizations 01/26/13 with chest pain, radiation to left arm with shortness of breath. Troponin was normal, EKG showed subtle ST segment depression in inferior leads. Echocardiogram showed normal EF, nuclear stress test showed no definitive evidence of prior infarction or ischemia. Attenuation artifact noted. Large area of attenuation involving the lateral wall may be from prior infarct. Medical management.  Overall she is doing very well. Occasionally will have left shoulder/left lateral arm pain she wonders if her rotator cuff is injured. She does not wish to have any further surgeries.   Wt Readings from Last 3 Encounters:  08/24/13 211 lb (95.709 kg)  01/27/13 214 lb 4.6 oz (97.2 kg)  11/14/12 203 lb 3.2 oz (92.171 kg)     Past Medical History  Diagnosis Date  . Hypertension   . Hyperlipidemia   . History of atrial tachycardia CONTROLLED W/ LOPRESSOR  . Peripheral vascular disease S/P RIGHT BKA  . Headache(784.0)   . Peripheral neuropathy LEGS AND HANDS  . Arthritis   . S/P BKA (below knee amputation) unilateral RIGHT  . GERD (gastroesophageal reflux disease)   . Anemia   . Blood transfusion   . Diabetes mellitus ORAL AND INSULIN MEDS  . Macular degeneration of both eyes   . Glaucoma   . Retinopathy due to secondary diabetes mellitus   . Insomnia   . Constipation   . History of bladder cancer TCC AND CIS  . MI (mitral incompetence)   . Coronary artery disease     Past  Surgical History  Procedure Laterality Date  . I & d right below knee amputation wound  06-10-2011  . Cysto / resection bladder bx's  04-01-11  &  11-26-10  . Below knee leg amputation  05-02-2007    RIGHT  . Right foot i & d / removal necrotic bone  JUN  &  AUG 2008  . Right great toe amputation  11-05-2006  . Laparoscopic cholecystectomy  12-10-1999  . Back surgery  [place]  . Appendectomy  1963  . Cataract extraction w/ intraocular lens  implant, bilateral    . Cystoscopy with biopsy  10/14/2011    Procedure: CYSTOSCOPY WITH BIOPSY;  Surgeon: Claybon Jabs, MD;  Location: [facility];  Service: Urology;  Laterality: N/A;  BLADDER BIOPSY  . Coronary stent placement    . Abdominal hysterectomy    . Revision urostomy cutaneous    . Revision urostomy cutaneous      Current Outpatient Prescriptions  Medication Sig Dispense Refill  . acetaminophen (TYLENOL) 500 MG tablet Take 1,000 mg by mouth as needed for pain.      Marland Kitchen aspirin 81 MG chewable tablet Chew 81 mg by mouth at bedtime.       Marland Kitchen atorvastatin (LIPITOR) 80 MG tablet Take 80 mg by mouth every evening.       . clopidogrel (PLAVIX) 75 MG tablet Take 75 mg by mouth daily.      . furosemide (LASIX)  40 MG tablet       . insulin glargine (LANTUS) 100 UNIT/ML injection Inject 55-60 Units into the skin 2 (two) times daily. 55 units in the morning and 60 units at night      . insulin lispro (HUMALOG) 100 UNIT/ML injection Inject 20-50 Units into the skin daily as needed for high blood sugar. PER SLIDING SCALE      . isosorbide mononitrate (IMDUR) 30 MG 24 hr tablet Take 1 tablet (30 mg total) by mouth daily.  30 tablet  3  . Melatonin 10 MG TABS Take 10 mg by mouth at bedtime.       . metoprolol succinate (TOPROL-XL) 100 MG 24 hr tablet Take 100 mg by mouth every evening. Take with or immediately following a meal.      . Multiple Vitamins-Minerals (PRESERVISION AREDS PO) Take 1 capsule by mouth 2 (two) times daily.       . nitroGLYCERIN (NITROSTAT) 0.4 MG SL tablet Place 0.4 mg under the tongue every 5 (five) minutes as needed for chest pain.      Marland Kitchen omeprazole (PRILOSEC) 20 MG capsule Take 20 mg by mouth daily as needed (gurd).       . polyethylene glycol (MIRALAX / GLYCOLAX) packet Take 17 g by mouth daily.  14 each  0  . potassium chloride SA (K-DUR,KLOR-CON) 20 MEQ tablet       . Psyllium (VEGETABLE LAXATIVE PO) Take 4 tablets by mouth at bedtime.      . SODIUM BICARBONATE PO Take 20 g by mouth 2 (two) times daily.       No current facility-administered medications for this visit.    Allergies:    Allergies  Allergen Reactions  . Clarithromycin Itching  . Lisinopril Other (See Comments)    Increased creatinine levels (pt currently taking low dose)  . Metformin And Related Other (See Comments)    Increased creatinine levels    Social History:  The patient  reports that she quit smoking about 37 years ago. Her smoking use included Cigarettes. She has a 34 pack-year smoking history. She has never used smokeless tobacco. She reports that she drinks alcohol. She reports that she does not use illicit drugs.   ROS:  Please see the history of present illness.   No syncope, no fevers, no bleeding.   PHYSICAL EXAM: VS:  BP 151/75  Pulse 67  Ht 5\' 3"  (1.6 m)  Wt 211 lb (95.709 kg)  BMI 37.39 kg/m2 Well nourished, well developed, in no acute distress HEENT: normal Neck: no JVD Cardiac:  normal S1, S2; RRR; no murmur Lungs:  clear to auscultation bilaterally, no wheezing, rhonchi or rales Abd: soft, nontender, no hepatomegaly Ext: no edemaRight BKA Skin: warm and dry Neuro: no focal abnormalities noted  EKG:  No current EKG. Labs: 6/14-LDL 58. Excellent.  ASSESSMENT AND PLAN:  1. Coronary artery disease-circumflex stent in the setting of old MI following bladder cancer surgery. Currently doing well, medical therapy. Nuclear stress test 6/14 reassuring. 2. Old myocardial infarction-as above.  Medical management. Prior OM stent. 3. Obesity-encourage weight loss. Low carbohydrate. 4. Peripheral vascular disease-right above-knee amputation. Aggressive management. 5. Diabetes-per Dr. Drema Dallas and team. 6. Left shoulder pain-asked her to address this with Dr. Drema Dallas. Perhaps physical therapy would be helpful.  Signed, Candee Furbish, MD [facility]  08/24/2013 10:56 AM

## 2013-09-27 ENCOUNTER — Other Ambulatory Visit: Payer: Self-pay | Admitting: Cardiology

## 2013-09-27 ENCOUNTER — Other Ambulatory Visit (HOSPITAL_COMMUNITY): Payer: Self-pay

## 2013-09-27 ENCOUNTER — Other Ambulatory Visit (HOSPITAL_COMMUNITY): Payer: Self-pay | Admitting: *Deleted

## 2013-09-28 ENCOUNTER — Inpatient Hospital Stay (HOSPITAL_COMMUNITY): Admission: RE | Admit: 2013-09-28 | Payer: Medicare HMO | Source: Ambulatory Visit

## 2013-10-04 ENCOUNTER — Encounter (HOSPITAL_COMMUNITY)
Admission: RE | Admit: 2013-10-04 | Discharge: 2013-10-04 | Disposition: A | Discharge status: Home / Self Care | Payer: Medicare HMO | Source: Ambulatory Visit | Attending: Nephrology | Admitting: Nephrology

## 2013-10-04 DIAGNOSIS — N183 Chronic kidney disease, stage 3 unspecified: Secondary | ICD-10-CM | POA: Insufficient documentation

## 2013-10-04 DIAGNOSIS — D509 Iron deficiency anemia, unspecified: Secondary | ICD-10-CM | POA: Insufficient documentation

## 2013-10-04 MED ORDER — SODIUM CHLORIDE 0.9 % IV SOLN: INTRAVENOUS | Status: DC

## 2013-10-04 MED ORDER — SODIUM CHLORIDE 0.9 % IV SOLN
1020.0000 mg | Freq: Once | INTRAVENOUS | Status: AC
  Administered 2013-10-04: 1020 mg via INTRAVENOUS
  Filled 2013-10-04: qty 34

## 2013-11-03 ENCOUNTER — Encounter (HOSPITAL_BASED_OUTPATIENT_CLINIC_OR_DEPARTMENT_OTHER): Payer: Medicare HMO | Attending: General Surgery

## 2013-11-03 DIAGNOSIS — E669 Obesity, unspecified: Secondary | ICD-10-CM | POA: Insufficient documentation

## 2013-11-03 DIAGNOSIS — I10 Essential (primary) hypertension: Secondary | ICD-10-CM | POA: Insufficient documentation

## 2013-11-03 DIAGNOSIS — E1169 Type 2 diabetes mellitus with other specified complication: Secondary | ICD-10-CM | POA: Insufficient documentation

## 2013-11-03 DIAGNOSIS — S88119A Complete traumatic amputation at level between knee and ankle, unspecified lower leg, initial encounter: Secondary | ICD-10-CM | POA: Insufficient documentation

## 2013-11-03 DIAGNOSIS — I2510 Atherosclerotic heart disease of native coronary artery without angina pectoris: Secondary | ICD-10-CM | POA: Insufficient documentation

## 2013-11-03 DIAGNOSIS — Z79899 Other long term (current) drug therapy: Secondary | ICD-10-CM | POA: Insufficient documentation

## 2013-11-03 DIAGNOSIS — C679 Malignant neoplasm of bladder, unspecified: Secondary | ICD-10-CM | POA: Insufficient documentation

## 2013-11-03 DIAGNOSIS — L97509 Non-pressure chronic ulcer of other part of unspecified foot with unspecified severity: Secondary | ICD-10-CM | POA: Insufficient documentation

## 2013-11-03 DIAGNOSIS — Z794 Long term (current) use of insulin: Secondary | ICD-10-CM | POA: Insufficient documentation

## 2013-11-04 NOTE — Progress Notes (Signed)
[facility]  NAME:  [name], [name]              ACCOUNT NO.:  [id number]  MEDICAL RECORD NO.:  [id number]      DATE OF BIRTH:  03/27/1941  PHYSICIAN:  Judene Companion, M.D.           VISIT DATE:                                  OFFICE VISIT   This is a 73 year old morbidly obese, diabetic lady who comes to [place] with an ulcer on her left great toe.  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  She is a type 2 diabetic, and has been for 25 years.  She weighs 208 pounds, she is 5 feet 2 inches tall.  She has had many other medical problems including carcinoma of the bladder. She has an ileal loop after having a cystectomy.  She also has ileal conduit.  She has had macular degeneration and glaucoma.  She has had myocardial infarction and she has had a stent placed.  Her medicines are the following; she takes aspirin 1 a day, fish oil, omeprazole, metoprolol, isosorbide, melatonin, NovoFine insulin, NovoLog, Lasix, Humalog.  She also takes Lantus 100 units every day. She takes hydrocodone for pain.  She also takes nitroglycerin and Klonopin.  Her vital signs showed a blood pressure of 139/84, respirations 16, pulse 69, temperature 97.  I looked at the wound and felt it was reasonably clean and decided to treat it with silver collagen and change the dressing every day and to offload it.  The ulcer is quite small, only 0.5 cm in diameter.  She does have a warm foot with good capillary refill, and according to her, her vascular doctor said that she had good vascular supply of her left leg.  DIAGNOSES: 1. Diabetic ulcer, left great toe, [place] 2. 2. Obesity. 3. Coronary artery disease. 4. Hypertension. 5. Type 2 diabetes. 6. 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     Judene Companion, M.D.     PP/MEDQ  D:  11/03/2013  T:  11/04/2013  Job:  [id number]

## 2013-11-10 ENCOUNTER — Encounter (HOSPITAL_BASED_OUTPATIENT_CLINIC_OR_DEPARTMENT_OTHER): Payer: Medicare HMO | Attending: General Surgery

## 2013-11-10 DIAGNOSIS — E1169 Type 2 diabetes mellitus with other specified complication: Secondary | ICD-10-CM | POA: Insufficient documentation

## 2013-11-10 DIAGNOSIS — L97509 Non-pressure chronic ulcer of other part of unspecified foot with unspecified severity: Secondary | ICD-10-CM | POA: Insufficient documentation

## 2013-11-17 ENCOUNTER — Other Ambulatory Visit: Payer: Self-pay | Admitting: Cardiology

## 2013-11-18 ENCOUNTER — Other Ambulatory Visit: Payer: Self-pay | Admitting: *Deleted

## 2013-11-18 MED ORDER — ATORVASTATIN CALCIUM 80 MG PO TABS: 80.0000 mg | ORAL_TABLET | Freq: Every evening | ORAL | Status: DC

## 2013-11-18 MED ORDER — METOPROLOL SUCCINATE ER 100 MG PO TB24: 100.0000 mg | ORAL_TABLET | Freq: Every evening | ORAL | Status: DC

## 2013-11-19 LAB — GLUCOSE, CAPILLARY: Glucose-Capillary: 392 mg/dL — ABNORMAL HIGH (ref 70–99)

## 2014-01-01 ENCOUNTER — Other Ambulatory Visit: Payer: Self-pay | Admitting: Cardiology

## 2014-02-10 ENCOUNTER — Encounter (HOSPITAL_BASED_OUTPATIENT_CLINIC_OR_DEPARTMENT_OTHER): Payer: Commercial Managed Care - HMO | Attending: Internal Medicine

## 2014-02-10 DIAGNOSIS — I2510 Atherosclerotic heart disease of native coronary artery without angina pectoris: Secondary | ICD-10-CM | POA: Diagnosis not present

## 2014-02-10 DIAGNOSIS — Z79899 Other long term (current) drug therapy: Secondary | ICD-10-CM | POA: Insufficient documentation

## 2014-02-10 DIAGNOSIS — Z794 Long term (current) use of insulin: Secondary | ICD-10-CM | POA: Diagnosis not present

## 2014-02-10 DIAGNOSIS — L84 Corns and callosities: Secondary | ICD-10-CM | POA: Diagnosis not present

## 2014-02-10 DIAGNOSIS — Z7902 Long term (current) use of antithrombotics/antiplatelets: Secondary | ICD-10-CM | POA: Diagnosis not present

## 2014-02-10 DIAGNOSIS — H3530 Unspecified macular degeneration: Secondary | ICD-10-CM | POA: Insufficient documentation

## 2014-02-10 DIAGNOSIS — Z8551 Personal history of malignant neoplasm of bladder: Secondary | ICD-10-CM | POA: Insufficient documentation

## 2014-02-10 DIAGNOSIS — E1169 Type 2 diabetes mellitus with other specified complication: Secondary | ICD-10-CM | POA: Insufficient documentation

## 2014-02-10 DIAGNOSIS — E785 Hyperlipidemia, unspecified: Secondary | ICD-10-CM | POA: Diagnosis not present

## 2014-02-10 DIAGNOSIS — L97509 Non-pressure chronic ulcer of other part of unspecified foot with unspecified severity: Secondary | ICD-10-CM | POA: Diagnosis not present

## 2014-02-10 DIAGNOSIS — Z7982 Long term (current) use of aspirin: Secondary | ICD-10-CM | POA: Diagnosis not present

## 2014-02-10 DIAGNOSIS — S88119A Complete traumatic amputation at level between knee and ankle, unspecified lower leg, initial encounter: Secondary | ICD-10-CM | POA: Insufficient documentation

## 2014-02-10 NOTE — Progress Notes (Signed)
[facility]  NAME:  [name], [name]              ACCOUNT NO.:  [id number]  MEDICAL RECORD NO.:  [id number]      DATE OF BIRTH:  1940/10/24  PHYSICIAN:  Ricard Dillon, M.D. VISIT DATE:  02/10/2014                                  OFFICE VISIT   HISTORY OF PRESENT ILLNESS:  [name] is a 73 year old patient who arrives for our review of wound on the plantar aspect of her left foot in between the fourth and fifth metatarsophalangeal joints.  She was recently discharged from this clinic in April at which time she had a wound on the dorsal left first toe.  In healing this, the patient tells me she had removed part of her shoe over the first toe and also taken out the insole of her shoe in order to allow room for the toe to be better offloaded.  She has not replaced the insole.  The history is that for the last 10-14 days, she noted some soreness, but was not really sure what the problem was, and there was a blister, which is open.  She has not been doing anything to this in terms of dressing.  PAST MEDICAL HISTORY: 1. 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 2. History of bladder cancer. 3. Hyperlipidemia. 4. Coronary artery disease. 5. Macular degeneration. 6. Right BKA. 7. Bladder removal with an ileal conduit. 8. Cardiac stents.  MEDICATION:  List is reviewed and includes: 1. ASA 81 daily. 2. Lipitor 80 daily. 3. Plavix 75 daily. 4. Lasix 40 daily. 5. Lantus 55 units b.i.d. 6. Melatonin 10 mg at bedtime. 7. Toprol-XL 100 daily. 8. Nitrostat 0.4 p.r.n. 9. Prilosec 20 daily. 10.MiraLAX 17 g a day.  PHYSICAL EXAMINATION:  VASCULAR:  The patient has palpable pulses in the dorsalis pedis.  Her ankle brachial index calculated in this clinic at 1.02. VITAL SIGNS:  Temperature is 97.9, pulse 60, respirations 18, blood pressure 136/86.  The area is a superficial wound on the plantar left foot.  This measures 1 x 1.3 x 0.1.  It had a gritty  surface eschar, which I debrided with a #15 scalpel.  She tolerated this well.  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  The area was debrided.  We dressed this with silver alginate foam, Kerlix, and net.  We made adjustments to her shoe including replacing her previous custom insole and felt off-loading of the lateral part of her foot below the metatarsal heads.  She will leave this dressing in place for a week.  I suspect this all may be friction related to her shoe.  Unfortunately, changing the shoe is not an option as she has the previous prosthesis on the right leg.  Her balance issues simply would not allow more aggressive off-loading of the left foot.  We will see her again in a week's time.          ______________________________ Ricard Dillon, M.D.     MGR/MEDQ  D:  02/10/2014  T:  02/10/2014  Job:  [id number]

## 2014-02-17 DIAGNOSIS — E785 Hyperlipidemia, unspecified: Secondary | ICD-10-CM | POA: Diagnosis not present

## 2014-02-17 DIAGNOSIS — E1169 Type 2 diabetes mellitus with other specified complication: Secondary | ICD-10-CM | POA: Diagnosis not present

## 2014-02-17 DIAGNOSIS — L84 Corns and callosities: Secondary | ICD-10-CM | POA: Diagnosis not present

## 2014-02-17 DIAGNOSIS — L97509 Non-pressure chronic ulcer of other part of unspecified foot with unspecified severity: Secondary | ICD-10-CM | POA: Diagnosis not present

## 2014-02-24 DIAGNOSIS — E1169 Type 2 diabetes mellitus with other specified complication: Secondary | ICD-10-CM | POA: Diagnosis not present

## 2014-02-24 DIAGNOSIS — E785 Hyperlipidemia, unspecified: Secondary | ICD-10-CM | POA: Diagnosis not present

## 2014-02-24 DIAGNOSIS — L84 Corns and callosities: Secondary | ICD-10-CM | POA: Diagnosis not present

## 2014-02-24 DIAGNOSIS — L97509 Non-pressure chronic ulcer of other part of unspecified foot with unspecified severity: Secondary | ICD-10-CM | POA: Diagnosis not present

## 2014-03-03 DIAGNOSIS — E1169 Type 2 diabetes mellitus with other specified complication: Secondary | ICD-10-CM | POA: Diagnosis not present

## 2014-03-03 DIAGNOSIS — E785 Hyperlipidemia, unspecified: Secondary | ICD-10-CM | POA: Diagnosis not present

## 2014-03-03 DIAGNOSIS — L84 Corns and callosities: Secondary | ICD-10-CM | POA: Diagnosis not present

## 2014-03-03 DIAGNOSIS — L97509 Non-pressure chronic ulcer of other part of unspecified foot with unspecified severity: Secondary | ICD-10-CM | POA: Diagnosis not present

## 2014-03-10 DIAGNOSIS — L84 Corns and callosities: Secondary | ICD-10-CM | POA: Diagnosis not present

## 2014-03-10 DIAGNOSIS — E785 Hyperlipidemia, unspecified: Secondary | ICD-10-CM | POA: Diagnosis not present

## 2014-03-10 DIAGNOSIS — L97509 Non-pressure chronic ulcer of other part of unspecified foot with unspecified severity: Secondary | ICD-10-CM | POA: Diagnosis not present

## 2014-03-10 DIAGNOSIS — E1169 Type 2 diabetes mellitus with other specified complication: Secondary | ICD-10-CM | POA: Diagnosis not present

## 2014-03-17 ENCOUNTER — Encounter (HOSPITAL_BASED_OUTPATIENT_CLINIC_OR_DEPARTMENT_OTHER): Payer: Commercial Managed Care - HMO | Attending: Internal Medicine

## 2014-03-17 DIAGNOSIS — L97409 Non-pressure chronic ulcer of unspecified heel and midfoot with unspecified severity: Secondary | ICD-10-CM | POA: Insufficient documentation

## 2014-03-17 DIAGNOSIS — E1169 Type 2 diabetes mellitus with other specified complication: Secondary | ICD-10-CM | POA: Diagnosis not present

## 2014-03-24 DIAGNOSIS — L97409 Non-pressure chronic ulcer of unspecified heel and midfoot with unspecified severity: Secondary | ICD-10-CM | POA: Diagnosis not present

## 2014-03-24 DIAGNOSIS — E1169 Type 2 diabetes mellitus with other specified complication: Secondary | ICD-10-CM | POA: Diagnosis not present

## 2014-03-31 DIAGNOSIS — E1169 Type 2 diabetes mellitus with other specified complication: Secondary | ICD-10-CM | POA: Diagnosis not present

## 2014-03-31 DIAGNOSIS — L97409 Non-pressure chronic ulcer of unspecified heel and midfoot with unspecified severity: Secondary | ICD-10-CM | POA: Diagnosis not present

## 2014-04-07 DIAGNOSIS — L97409 Non-pressure chronic ulcer of unspecified heel and midfoot with unspecified severity: Secondary | ICD-10-CM | POA: Diagnosis not present

## 2014-04-07 DIAGNOSIS — E1169 Type 2 diabetes mellitus with other specified complication: Secondary | ICD-10-CM | POA: Diagnosis not present

## 2014-04-14 ENCOUNTER — Encounter (HOSPITAL_BASED_OUTPATIENT_CLINIC_OR_DEPARTMENT_OTHER): Payer: Commercial Managed Care - HMO | Attending: Internal Medicine

## 2014-04-14 DIAGNOSIS — E1169 Type 2 diabetes mellitus with other specified complication: Secondary | ICD-10-CM | POA: Diagnosis not present

## 2014-04-14 DIAGNOSIS — L97409 Non-pressure chronic ulcer of unspecified heel and midfoot with unspecified severity: Secondary | ICD-10-CM | POA: Diagnosis not present

## 2014-04-21 DIAGNOSIS — L97409 Non-pressure chronic ulcer of unspecified heel and midfoot with unspecified severity: Secondary | ICD-10-CM | POA: Diagnosis not present

## 2014-04-21 DIAGNOSIS — E1169 Type 2 diabetes mellitus with other specified complication: Secondary | ICD-10-CM | POA: Diagnosis not present

## 2014-04-28 DIAGNOSIS — L97409 Non-pressure chronic ulcer of unspecified heel and midfoot with unspecified severity: Secondary | ICD-10-CM | POA: Diagnosis not present

## 2014-04-28 DIAGNOSIS — E1169 Type 2 diabetes mellitus with other specified complication: Secondary | ICD-10-CM | POA: Diagnosis not present

## 2014-05-05 DIAGNOSIS — L97409 Non-pressure chronic ulcer of unspecified heel and midfoot with unspecified severity: Secondary | ICD-10-CM | POA: Diagnosis not present

## 2014-05-05 DIAGNOSIS — E1169 Type 2 diabetes mellitus with other specified complication: Secondary | ICD-10-CM | POA: Diagnosis not present

## 2014-05-12 ENCOUNTER — Encounter (HOSPITAL_BASED_OUTPATIENT_CLINIC_OR_DEPARTMENT_OTHER): Payer: Medicare HMO | Attending: Internal Medicine

## 2014-05-12 DIAGNOSIS — E13621 Other specified diabetes mellitus with foot ulcer: Secondary | ICD-10-CM | POA: Diagnosis present

## 2014-05-12 DIAGNOSIS — L97529 Non-pressure chronic ulcer of other part of left foot with unspecified severity: Secondary | ICD-10-CM | POA: Diagnosis not present

## 2014-05-19 DIAGNOSIS — L97529 Non-pressure chronic ulcer of other part of left foot with unspecified severity: Secondary | ICD-10-CM | POA: Diagnosis not present

## 2014-05-19 DIAGNOSIS — E13621 Other specified diabetes mellitus with foot ulcer: Secondary | ICD-10-CM | POA: Diagnosis not present

## 2014-05-26 DIAGNOSIS — E13621 Other specified diabetes mellitus with foot ulcer: Secondary | ICD-10-CM | POA: Diagnosis not present

## 2014-05-26 DIAGNOSIS — L97529 Non-pressure chronic ulcer of other part of left foot with unspecified severity: Secondary | ICD-10-CM | POA: Diagnosis not present

## 2014-06-02 DIAGNOSIS — E13621 Other specified diabetes mellitus with foot ulcer: Secondary | ICD-10-CM | POA: Diagnosis not present

## 2014-06-02 DIAGNOSIS — L97529 Non-pressure chronic ulcer of other part of left foot with unspecified severity: Secondary | ICD-10-CM | POA: Diagnosis not present

## 2014-06-09 DIAGNOSIS — L97529 Non-pressure chronic ulcer of other part of left foot with unspecified severity: Secondary | ICD-10-CM | POA: Diagnosis not present

## 2014-06-09 DIAGNOSIS — E13621 Other specified diabetes mellitus with foot ulcer: Secondary | ICD-10-CM | POA: Diagnosis not present

## 2014-06-13 ENCOUNTER — Encounter (HOSPITAL_BASED_OUTPATIENT_CLINIC_OR_DEPARTMENT_OTHER): Payer: Commercial Managed Care - HMO | Attending: Internal Medicine

## 2014-06-13 DIAGNOSIS — L97429 Non-pressure chronic ulcer of left heel and midfoot with unspecified severity: Secondary | ICD-10-CM | POA: Diagnosis not present

## 2014-06-17 DIAGNOSIS — L97429 Non-pressure chronic ulcer of left heel and midfoot with unspecified severity: Secondary | ICD-10-CM | POA: Diagnosis not present

## 2014-06-24 DIAGNOSIS — L97429 Non-pressure chronic ulcer of left heel and midfoot with unspecified severity: Secondary | ICD-10-CM | POA: Diagnosis not present

## 2014-06-25 ENCOUNTER — Other Ambulatory Visit: Payer: Self-pay | Admitting: Cardiology

## 2014-06-27 ENCOUNTER — Other Ambulatory Visit: Payer: Self-pay | Admitting: Cardiology

## 2014-06-29 ENCOUNTER — Other Ambulatory Visit: Payer: Self-pay

## 2014-07-01 DIAGNOSIS — L97429 Non-pressure chronic ulcer of left heel and midfoot with unspecified severity: Secondary | ICD-10-CM | POA: Diagnosis not present

## 2014-07-11 DIAGNOSIS — L97429 Non-pressure chronic ulcer of left heel and midfoot with unspecified severity: Secondary | ICD-10-CM | POA: Diagnosis not present

## 2014-07-28 ENCOUNTER — Encounter (HOSPITAL_BASED_OUTPATIENT_CLINIC_OR_DEPARTMENT_OTHER): Payer: Commercial Managed Care - HMO | Attending: Internal Medicine

## 2014-07-28 DIAGNOSIS — E08621 Diabetes mellitus due to underlying condition with foot ulcer: Secondary | ICD-10-CM | POA: Diagnosis not present

## 2014-07-28 DIAGNOSIS — L97229 Non-pressure chronic ulcer of left calf with unspecified severity: Secondary | ICD-10-CM | POA: Insufficient documentation

## 2014-07-28 DIAGNOSIS — L97529 Non-pressure chronic ulcer of other part of left foot with unspecified severity: Secondary | ICD-10-CM | POA: Diagnosis not present

## 2014-07-28 DIAGNOSIS — I872 Venous insufficiency (chronic) (peripheral): Secondary | ICD-10-CM | POA: Insufficient documentation

## 2014-08-16 ENCOUNTER — Ambulatory Visit (HOSPITAL_COMMUNITY)
Admission: RE | Admit: 2014-08-16 | Discharge: 2014-08-16 | Disposition: A | Discharge status: Home / Self Care | Payer: PPO | Source: Ambulatory Visit | Attending: Internal Medicine | Admitting: Internal Medicine

## 2014-08-16 ENCOUNTER — Other Ambulatory Visit: Payer: Self-pay | Admitting: Internal Medicine

## 2014-08-16 ENCOUNTER — Encounter (HOSPITAL_BASED_OUTPATIENT_CLINIC_OR_DEPARTMENT_OTHER): Payer: PPO | Attending: Internal Medicine

## 2014-08-16 DIAGNOSIS — M85872 Other specified disorders of bone density and structure, left ankle and foot: Secondary | ICD-10-CM | POA: Insufficient documentation

## 2014-08-16 DIAGNOSIS — E119 Type 2 diabetes mellitus without complications: Secondary | ICD-10-CM | POA: Diagnosis not present

## 2014-08-16 DIAGNOSIS — S91102A Unspecified open wound of left great toe without damage to nail, initial encounter: Secondary | ICD-10-CM | POA: Diagnosis present

## 2014-08-16 DIAGNOSIS — E11621 Type 2 diabetes mellitus with foot ulcer: Secondary | ICD-10-CM | POA: Diagnosis present

## 2014-08-16 DIAGNOSIS — M7989 Other specified soft tissue disorders: Secondary | ICD-10-CM | POA: Insufficient documentation

## 2014-08-16 DIAGNOSIS — X58XXXA Exposure to other specified factors, initial encounter: Secondary | ICD-10-CM | POA: Diagnosis not present

## 2014-08-16 DIAGNOSIS — L97524 Non-pressure chronic ulcer of other part of left foot with necrosis of bone: Secondary | ICD-10-CM | POA: Insufficient documentation

## 2014-08-16 DIAGNOSIS — M2042 Other hammer toe(s) (acquired), left foot: Secondary | ICD-10-CM | POA: Diagnosis not present

## 2014-08-16 DIAGNOSIS — M7662 Achilles tendinitis, left leg: Secondary | ICD-10-CM | POA: Insufficient documentation

## 2014-08-16 DIAGNOSIS — M869 Osteomyelitis, unspecified: Secondary | ICD-10-CM

## 2014-08-17 ENCOUNTER — Other Ambulatory Visit: Payer: Self-pay | Admitting: Cardiology

## 2014-08-17 ENCOUNTER — Ambulatory Visit (INDEPENDENT_AMBULATORY_CARE_PROVIDER_SITE_OTHER): Payer: PPO | Admitting: Cardiology

## 2014-08-17 ENCOUNTER — Encounter: Payer: Self-pay | Admitting: Cardiology

## 2014-08-17 VITALS — BP 128/70 | HR 102 | Ht 63.0 in | Wt 211.0 lb

## 2014-08-17 DIAGNOSIS — E1151 Type 2 diabetes mellitus with diabetic peripheral angiopathy without gangrene: Secondary | ICD-10-CM

## 2014-08-17 DIAGNOSIS — I2510 Atherosclerotic heart disease of native coronary artery without angina pectoris: Secondary | ICD-10-CM

## 2014-08-17 DIAGNOSIS — Z89611 Acquired absence of right leg above knee: Secondary | ICD-10-CM

## 2014-08-17 DIAGNOSIS — I10 Essential (primary) hypertension: Secondary | ICD-10-CM

## 2014-08-17 DIAGNOSIS — I2583 Coronary atherosclerosis due to lipid rich plaque: Secondary | ICD-10-CM

## 2014-08-17 DIAGNOSIS — E118 Type 2 diabetes mellitus with unspecified complications: Secondary | ICD-10-CM

## 2014-08-17 MED ORDER — ISOSORBIDE MONONITRATE ER 30 MG PO TB24: 30.0000 mg | ORAL_TABLET | Freq: Every day | ORAL | Status: DC

## 2014-08-17 NOTE — Progress Notes (Signed)
[name]. [address] [place], [place]  [address] Phone: [phone] Fax:  [phone]  Date:  08/17/2014   ID:  [name], DOB [birth year], MRN [id number]  PCP:  Gerrit Heck, MD   History of Present Illness: [name] is a 74 y.o. female with coronary artery disease status post MI in January of 2014 following bladder surgery at [facility], uncontrolled diabetes, hypertension, hyperlipidemia, peripheral vascular disease with right knee BKA, chronic kidney disease here for followup.  Prior hospitalizations 01/26/13 with chest pain, radiation to left arm with shortness of breath. Troponin was normal, EKG showed subtle ST segment depression in inferior leads. Echocardiogram showed normal EF, nuclear stress test showed no definitive evidence of prior infarction or ischemia. Attenuation artifact noted. Large area of attenuation involving the lateral wall may be from prior infarct. Medical management.  She has been battling ulcerative wounds on her left foot. Prior right BKA. Prior skin grafting.   Wt Readings from Last 3 Encounters:  08/17/14 211 lb (95.709 kg)  10/04/13 210 lb (95.255 kg)  08/24/13 211 lb (95.709 kg)     Past Medical History  Diagnosis Date  . Hypertension   . Hyperlipidemia   . History of atrial tachycardia CONTROLLED W/ LOPRESSOR  . Peripheral vascular disease S/P RIGHT BKA  . Headache(784.0)   . Peripheral neuropathy LEGS AND HANDS  . Arthritis   . S/P BKA (below knee amputation) unilateral RIGHT  . GERD (gastroesophageal reflux disease)   . Anemia   . Blood transfusion   . Diabetes mellitus ORAL AND INSULIN MEDS  . Macular degeneration of both eyes   . Glaucoma   . Retinopathy due to secondary diabetes mellitus   . Insomnia   . Constipation   . History of bladder cancer TCC AND CIS  . MI (mitral incompetence)   . Coronary artery disease     Past Surgical History  Procedure Laterality Date  . I & d right below knee  amputation wound  06-10-2011  . Cysto / resection bladder bx's  04-01-11  &  11-26-10  . Below knee leg amputation  05-02-2007    RIGHT  . Right foot i & d / removal necrotic bone  JUN  &  AUG 2008  . Right great toe amputation  11-05-2006  . Laparoscopic cholecystectomy  12-10-1999  . Back surgery  [employer]  . Appendectomy  1963  . Cataract extraction w/ intraocular lens  implant, bilateral    . Cystoscopy with biopsy  10/14/2011    Procedure: CYSTOSCOPY WITH BIOPSY;  Surgeon: Claybon Jabs, MD;  Location: [facility];  Service: Urology;  Laterality: N/A;  BLADDER BIOPSY  . Coronary stent placement    . Abdominal hysterectomy    . Revision urostomy cutaneous    . Revision urostomy cutaneous      Current Outpatient Prescriptions  Medication Sig Dispense Refill  . acetaminophen (TYLENOL) 500 MG tablet Take 1,000 mg by mouth as needed for pain.    Marland Kitchen aspirin 81 MG chewable tablet Chew 81 mg by mouth at bedtime.     Marland Kitchen atorvastatin (LIPITOR) 80 MG tablet Take 1 tablet (80 mg total) by mouth every evening. 90 tablet 2  . clopidogrel (PLAVIX) 75 MG tablet TAKE ONE TABLET BY MOUTH DAILY 30 tablet 0  . furosemide (LASIX) 40 MG tablet Take 80 mg by mouth 2 (two) times daily.     . insulin  glargine (LANTUS) 100 UNIT/ML injection Inject 55-60 Units into the skin 2 (two) times daily. 55 units in the morning and 60 units at night    . insulin lispro (HUMALOG) 100 UNIT/ML injection Inject 20-50 Units into the skin daily as needed for high blood sugar. PER SLIDING SCALE    . isosorbide mononitrate (IMDUR) 30 MG 24 hr tablet Take 1 tablet (30 mg total) by mouth daily. 30 tablet 3  . Melatonin 10 MG TABS Take 10 mg by mouth at bedtime.     . metoprolol succinate (TOPROL-XL) 100 MG 24 hr tablet Take 1 tablet (100 mg total) by mouth every evening. Take with or immediately following a meal. 90 tablet 2  . Multiple Vitamins-Minerals (PRESERVISION AREDS PO) Take 1 capsule by mouth 2 (two)  times daily.    . nitroGLYCERIN (NITROSTAT) 0.4 MG SL tablet Place 0.4 mg under the tongue every 5 (five) minutes as needed for chest pain.    Marland Kitchen omeprazole (PRILOSEC) 20 MG capsule Take 20 mg by mouth daily as needed (gurd).     . polyethylene glycol (MIRALAX / GLYCOLAX) packet Take 17 g by mouth daily. 14 each 0  . potassium chloride SA (K-DUR,KLOR-CON) 20 MEQ tablet Take 20 mEq by mouth daily.     . Psyllium (VEGETABLE LAXATIVE PO) Take 4 tablets by mouth at bedtime.    . SODIUM BICARBONATE PO Take 20 g by mouth 2 (two) times daily.     No current facility-administered medications for this visit.    Allergies:    Allergies  Allergen Reactions  . Clarithromycin Itching  . Lisinopril Other (See Comments)    Increased creatinine levels (pt currently taking low dose)  . Metformin And Related Other (See Comments)    Increased creatinine levels    Social History:  The patient  reports that she quit smoking about 38 years ago. Her smoking use included Cigarettes. She has a 34 pack-year smoking history. She has never used smokeless tobacco. She reports that she drinks alcohol. She reports that she does not use illicit drugs.   ROS:  Please see the history of present illness.   No syncope, no fevers, no bleeding.   PHYSICAL EXAM: VS:  BP 128/70 mmHg  Pulse 102  Ht 5\' 3"  (1.6 m)  Wt 211 lb (95.709 kg)  BMI 37.39 kg/m2 Well nourished, well developed, in no acute distress HEENT: normal Neck: no JVD Cardiac:  normal S1, S2; RRR; no murmur Lungs:  clear to auscultation bilaterally, no wheezing, rhonchi or rales Abd: soft, nontender, no hepatomegaly Ext: no edemaRight BKA Skin: warm and dry, left foot wrapped, edematous leg, boot Neuro: no focal abnormalities noted  EKG: 08/17/14-sinus tachycardia rate 102 with nonspecific ST-T wave changes Labs: 07/20/14-hemoglobin A1c 13.3, BUN 54, creatinine 1.5 to, sodium 134, potassium 4.2. LDL 43, HDL 25 6/14-LDL 58. Excellent.  ASSESSMENT AND  PLAN:  1. Coronary artery disease-circumflex stent in the setting of old MI following bladder cancer surgery. Currently doing well, medical therapy. Nuclear stress test 6/14 reassuring. She will occasionally have anginal symptoms. Last one approximately 4 months ago. Took nitroglycerin, extra isosorbide, this helped but the episode was quite severe she states. She had one episode prior to that that was brief and relieved with one nitroglycerin. We will continue to monitor if she has any further worsening symptoms. I expressed the importance of her continuing to work on her diabetes. 2. Old myocardial infarction-as above. Medical management. Prior OM stent. 3. Obesity-encourage weight loss.  Low carbohydrate. 4. Peripheral vascular disease-right below-knee amputation. Aggressive management. 5. Diabetes-per Dr. Drema Dallas and team. Poor control with hemoglobin A1c of 13. She is going to see an endocrinologist next week. 6. Six-month follow-up  Signed, Candee Furbish, MD [facility]  08/17/2014 2:32 PM

## 2014-08-17 NOTE — Patient Instructions (Signed)
Your physician recommends that you continue on your current medications as directed. Please refer to the Current Medication list given to you today.  Your physician wants you to follow-up in: 6 months with Dr. Skains. You will receive a reminder letter in the mail two months in advance. If you don't receive a letter, please call our office to schedule the follow-up appointment.  

## 2014-08-25 DIAGNOSIS — L97524 Non-pressure chronic ulcer of other part of left foot with necrosis of bone: Secondary | ICD-10-CM | POA: Diagnosis not present

## 2014-08-25 DIAGNOSIS — E11621 Type 2 diabetes mellitus with foot ulcer: Secondary | ICD-10-CM | POA: Diagnosis not present

## 2014-08-26 ENCOUNTER — Other Ambulatory Visit: Payer: Self-pay | Admitting: Internal Medicine

## 2014-08-26 DIAGNOSIS — M869 Osteomyelitis, unspecified: Secondary | ICD-10-CM

## 2014-08-29 ENCOUNTER — Ambulatory Visit (HOSPITAL_COMMUNITY)
Admission: RE | Admit: 2014-08-29 | Discharge: 2014-08-29 | Disposition: A | Discharge status: Home / Self Care | Payer: PPO | Source: Ambulatory Visit | Attending: Surgery | Admitting: Surgery

## 2014-08-29 ENCOUNTER — Other Ambulatory Visit: Payer: Self-pay | Admitting: Internal Medicine

## 2014-08-29 DIAGNOSIS — Z89511 Acquired absence of right leg below knee: Secondary | ICD-10-CM | POA: Diagnosis not present

## 2014-08-29 DIAGNOSIS — L98491 Non-pressure chronic ulcer of skin of other sites limited to breakdown of skin: Secondary | ICD-10-CM | POA: Diagnosis not present

## 2014-08-29 DIAGNOSIS — Z87891 Personal history of nicotine dependence: Secondary | ICD-10-CM | POA: Diagnosis not present

## 2014-08-29 DIAGNOSIS — E119 Type 2 diabetes mellitus without complications: Secondary | ICD-10-CM | POA: Insufficient documentation

## 2014-08-29 DIAGNOSIS — E785 Hyperlipidemia, unspecified: Secondary | ICD-10-CM | POA: Insufficient documentation

## 2014-08-29 DIAGNOSIS — I10 Essential (primary) hypertension: Secondary | ICD-10-CM | POA: Insufficient documentation

## 2014-09-01 ENCOUNTER — Other Ambulatory Visit: Payer: Self-pay | Admitting: Internal Medicine

## 2014-09-01 DIAGNOSIS — L97524 Non-pressure chronic ulcer of other part of left foot with necrosis of bone: Secondary | ICD-10-CM | POA: Diagnosis not present

## 2014-09-01 DIAGNOSIS — E11621 Type 2 diabetes mellitus with foot ulcer: Secondary | ICD-10-CM | POA: Diagnosis not present

## 2014-09-02 NOTE — Progress Notes (Signed)
[facility]  NAME:  [name], FIORINI              ACCOUNT NO.:  [id number]  MEDICAL RECORD NO.:  [id number]      DATE OF BIRTH:  March 20, 1941  PHYSICIAN:  Ricard Dillon, M.D. VISIT DATE:  09/01/2014                                  OFFICE VISIT   FACILITY:  [facility].  HISTORY:  [name] is a 74 year old patient whom we had been following in this clinic on several occasions.  She was discharged from the clinic in April 2015 for a wound on her dorsal left toe that had healed out. She re-presented on 02/10/2014 for a wound on her left foot between the fourth and fifth metatarsophalangeal joints.  In spite of the fact she has had a previous right BKA, she was able to tolerate a total contact cast.  Then we healed this out on November 30th.  She re-presented to Dr. Lindon Romp earlier this month with new wounds substantially on the right lateral aspect of her left first metatarsal head.  Cultures of this area ultimately grew MRSA.  Her ABIs done in the Vascular Clinic really are not very good showing an ankle-brachial index of 0.43 and a toe brachial index of 0.41.  She has monophasic waveforms.  LABORATORY DATA:  Lab work has shown a white count of 11.4.  Her BUN is 65 and creatinine of 2.11.  She has chronic renal failure.  Her sedimentation rate is 80.  On examination in the last week and this week, there was a large area of necrosis over her lateral and plantar first metatarsal head.  The tissue was not viable.  Probing down to the bone today with surrounding almost shock like material, I wondered whether this was uric acid and/or heterotopic calcifications or if this is simply necrotic bone.  I sent samples of these for CNS and histology.  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 I also think she has infection on the lateral aspect of her foot.  She is not systemically unwell.  She is to have an MRI on Tuesday.  I  am going to continue her on these antibiotics and refer her to Vascular Surgery to see if there is any consideration for anything other than a below-knee amputation on the left.  I have told the patient, I do not think this is going to be a salvageable situation.          ______________________________ Ricard Dillon, M.D.     MGR/MEDQ  D:  09/01/2014  T:  09/02/2014  Job:  [id number]

## 2014-09-05 ENCOUNTER — Other Ambulatory Visit: Payer: Self-pay | Admitting: Internal Medicine

## 2014-09-05 ENCOUNTER — Encounter: Payer: Self-pay | Admitting: Vascular Surgery

## 2014-09-05 ENCOUNTER — Other Ambulatory Visit: Payer: Self-pay | Admitting: *Deleted

## 2014-09-05 DIAGNOSIS — M62572 Muscle wasting and atrophy, not elsewhere classified, left ankle and foot: Secondary | ICD-10-CM | POA: Diagnosis not present

## 2014-09-05 DIAGNOSIS — I739 Peripheral vascular disease, unspecified: Secondary | ICD-10-CM

## 2014-09-05 DIAGNOSIS — N289 Disorder of kidney and ureter, unspecified: Secondary | ICD-10-CM | POA: Insufficient documentation

## 2014-09-05 DIAGNOSIS — M62562 Muscle wasting and atrophy, not elsewhere classified, left lower leg: Secondary | ICD-10-CM | POA: Insufficient documentation

## 2014-09-05 DIAGNOSIS — Z89511 Acquired absence of right leg below knee: Secondary | ICD-10-CM | POA: Diagnosis not present

## 2014-09-05 DIAGNOSIS — I70245 Atherosclerosis of native arteries of left leg with ulceration of other part of foot: Secondary | ICD-10-CM | POA: Diagnosis not present

## 2014-09-05 DIAGNOSIS — R937 Abnormal findings on diagnostic imaging of other parts of musculoskeletal system: Secondary | ICD-10-CM | POA: Insufficient documentation

## 2014-09-05 DIAGNOSIS — E11621 Type 2 diabetes mellitus with foot ulcer: Secondary | ICD-10-CM

## 2014-09-05 DIAGNOSIS — L97529 Non-pressure chronic ulcer of other part of left foot with unspecified severity: Secondary | ICD-10-CM | POA: Diagnosis present

## 2014-09-05 DIAGNOSIS — M869 Osteomyelitis, unspecified: Secondary | ICD-10-CM

## 2014-09-05 LAB — POCT I-STAT CREATININE: Creatinine, Ser: 1.6 mg/dL — ABNORMAL HIGH (ref 0.50–1.10)

## 2014-09-06 ENCOUNTER — Ambulatory Visit (HOSPITAL_COMMUNITY)
Admission: RE | Admit: 2014-09-06 | Discharge: 2014-09-06 | Disposition: A | Discharge status: Home / Self Care | Payer: PPO | Source: Ambulatory Visit | Attending: Internal Medicine | Admitting: Internal Medicine

## 2014-09-06 ENCOUNTER — Ambulatory Visit (INDEPENDENT_AMBULATORY_CARE_PROVIDER_SITE_OTHER): Payer: PPO | Admitting: Vascular Surgery

## 2014-09-06 ENCOUNTER — Other Ambulatory Visit: Payer: Self-pay

## 2014-09-06 ENCOUNTER — Encounter: Payer: Self-pay | Admitting: Vascular Surgery

## 2014-09-06 ENCOUNTER — Ambulatory Visit (INDEPENDENT_AMBULATORY_CARE_PROVIDER_SITE_OTHER)
Admission: RE | Admit: 2014-09-06 | Discharge: 2014-09-06 | Disposition: A | Discharge status: Home / Self Care | Payer: PPO | Source: Ambulatory Visit | Attending: Vascular Surgery | Admitting: Vascular Surgery

## 2014-09-06 VITALS — BP 134/49 | HR 101 | Temp 99.0°F | Ht 64.0 in | Wt 211.0 lb

## 2014-09-06 DIAGNOSIS — M62572 Muscle wasting and atrophy, not elsewhere classified, left ankle and foot: Secondary | ICD-10-CM | POA: Insufficient documentation

## 2014-09-06 DIAGNOSIS — Z89511 Acquired absence of right leg below knee: Secondary | ICD-10-CM | POA: Insufficient documentation

## 2014-09-06 DIAGNOSIS — I70245 Atherosclerosis of native arteries of left leg with ulceration of other part of foot: Secondary | ICD-10-CM | POA: Diagnosis not present

## 2014-09-06 DIAGNOSIS — I70269 Atherosclerosis of native arteries of extremities with gangrene, unspecified extremity: Secondary | ICD-10-CM

## 2014-09-06 DIAGNOSIS — M869 Osteomyelitis, unspecified: Secondary | ICD-10-CM

## 2014-09-06 DIAGNOSIS — R937 Abnormal findings on diagnostic imaging of other parts of musculoskeletal system: Secondary | ICD-10-CM | POA: Insufficient documentation

## 2014-09-06 DIAGNOSIS — L97529 Non-pressure chronic ulcer of other part of left foot with unspecified severity: Secondary | ICD-10-CM | POA: Insufficient documentation

## 2014-09-06 DIAGNOSIS — I739 Peripheral vascular disease, unspecified: Secondary | ICD-10-CM

## 2014-09-06 NOTE — Progress Notes (Signed)
Patient name: [name] MRN: [id number] DOB: [birth year] Sex: female   Referred by: Drema Dallas  Reason for referral:  Chief Complaint  Patient presents with  . New Evaluation    vascular consult    HISTORY OF PRESENT ILLNESS: The patient is seen today for evaluation of gangrenous changes of her left foot. She is very pleasant 74 year old with a very complex past medical history. She did have nonhealing ulcerations of her right foot in 2008 and had a partial foot amputation and subsequently a below-knee amputation by Dr. Ninfa Linden. She has had issues with wound problems in her left foot since the last 6-8 months. She did have healing of this and then has had recurrences. She does have neuropathy with not much pain associated with her left foot. She does walk with a right leg prosthesis. The mortise changes of her left great toe been progressive and she is developing changes in her right foot as well. She did undergo an MRI yesterday which I have reviewed as well. Her past history is significant for bladder cancer. She had a resection of this with an extensive surgery at [facility] and had a myocardial infarction following this. She also has a history of renal insufficiency but has not been on hemodialysis and does have stage III chronic renal insufficiency  Past Medical History  Diagnosis Date  . Hypertension   . Hyperlipidemia   . History of atrial tachycardia CONTROLLED W/ LOPRESSOR  . Peripheral vascular disease S/P RIGHT BKA  . Headache(784.0)   . Peripheral neuropathy LEGS AND HANDS  . Arthritis   . S/P BKA (below knee amputation) unilateral RIGHT  . GERD (gastroesophageal reflux disease)   . Anemia   . Blood transfusion   . Diabetes mellitus ORAL AND INSULIN MEDS  . Macular degeneration of both eyes   . Glaucoma   . Retinopathy due to secondary diabetes mellitus   . Insomnia   . Constipation   . History of bladder cancer TCC AND CIS  . MI (mitral  incompetence)   . Coronary artery disease   . Chronic kidney disease   . Cancer     bladder    Past Surgical History  Procedure Laterality Date  . I & d right below knee amputation wound  06-10-2011  . Cysto / resection bladder bx's  04-01-11  &  11-26-10  . Below knee leg amputation  05-02-2007    RIGHT  . Right foot i & d / removal necrotic bone  JUN  &  AUG 2008  . Right great toe amputation  11-05-2006  . Laparoscopic cholecystectomy  12-10-1999  . Back surgery  [place]  . Appendectomy  1963  . Cataract extraction w/ intraocular lens  implant, bilateral    . Cystoscopy with biopsy  10/14/2011    Procedure: CYSTOSCOPY WITH BIOPSY;  Surgeon: Claybon Jabs, MD;  Location: [facility];  Service: Urology;  Laterality: N/A;  BLADDER BIOPSY  . Coronary stent placement    . Abdominal hysterectomy    . Revision urostomy cutaneous    . Revision urostomy cutaneous      History   Social History  . Marital Status: Legally Separated    Spouse Name: N/A    Number of Children: N/A  . Years of Education: N/A   Occupational History  . Not on file.   Social History Main Topics  . Smoking status: Former Smoker -- 2.00  packs/day for 17 years    Types: Cigarettes    Quit date: 10/09/1975  . Smokeless tobacco: Never Used  . Alcohol Use: 0.0 oz/week    0 Not specified per week     Comment: RARE  . Drug Use: No  . Sexual Activity: Not on file   Other Topics Concern  . Not on file   Social History Narrative    Family History  Problem Relation Age of Onset  . Heart failure Mother     died age 26  . Sudden death Father 47  . Heart disease Father   . Hypertension Father   . Heart attack Father   . Diabetes Brother   . Heart attack Brother   . AAA (abdominal aortic aneurysm) Brother     Allergies as of 09/06/2014 - Review Complete 09/06/2014  Allergen Reaction Noted  . Clarithromycin Itching 10/09/2011  . Lisinopril Other (See Comments) 11/14/2012  .  Metformin and related Other (See Comments) 11/14/2012    Current Outpatient Prescriptions on File Prior to Visit  Medication Sig Dispense Refill  . acetaminophen (TYLENOL) 500 MG tablet Take 1,000 mg by mouth as needed for pain.    Marland Kitchen aspirin 81 MG chewable tablet Chew 81 mg by mouth at bedtime.     Marland Kitchen atorvastatin (LIPITOR) 80 MG tablet Take 1 tablet (80 mg total) by mouth every evening. 90 tablet 2  . clopidogrel (PLAVIX) 75 MG tablet TAKE ONE TABLET BY MOUTH DAILY 30 tablet 0  . furosemide (LASIX) 40 MG tablet Take 80 mg by mouth 2 (two) times daily.     . insulin glargine (LANTUS) 100 UNIT/ML injection Inject 55-60 Units into the skin 2 (two) times daily. 55 units in the morning and 60 units at night    . insulin lispro (HUMALOG) 100 UNIT/ML injection Inject 20-50 Units into the skin daily as needed for high blood sugar. PER SLIDING SCALE    . isosorbide mononitrate (IMDUR) 30 MG 24 hr tablet Take 1 tablet (30 mg total) by mouth daily. 30 tablet 11  . Melatonin 10 MG TABS Take 10 mg by mouth at bedtime.     . metoprolol succinate (TOPROL-XL) 100 MG 24 hr tablet TAKE 1 TABLET (100 MG TOTAL) BY MOUTH EVERY EVENING. TAKE WITH OR IMMEDIATELY FOLLOWING A MEAL. 90 tablet 1  . Multiple Vitamins-Minerals (PRESERVISION AREDS PO) Take 1 capsule by mouth 2 (two) times daily.    . nitroGLYCERIN (NITROSTAT) 0.4 MG SL tablet Place 0.4 mg under the tongue every 5 (five) minutes as needed for chest pain.    Marland Kitchen omeprazole (PRILOSEC) 20 MG capsule Take 20 mg by mouth daily as needed (gurd).     . polyethylene glycol (MIRALAX / GLYCOLAX) packet Take 17 g by mouth daily. 14 each 0  . potassium chloride SA (K-DUR,KLOR-CON) 20 MEQ tablet Take 20 mEq by mouth daily.     . Psyllium (VEGETABLE LAXATIVE PO) Take 4 tablets by mouth at bedtime.    . SODIUM BICARBONATE PO Take 20 g by mouth 2 (two) times daily.     No current facility-administered medications on file prior to visit.     REVIEW OF  SYSTEMS:  Positives indicated with an "X"  CARDIOVASCULAR:  [ ]  chest pain   [ ]  chest pressure   [ ]  palpitations   [ ]  orthopnea   [ ]  dyspnea on exertion   [ ]  claudication   [ ]  rest pain   [ ]  DVT   [ ]   phlebitis PULMONARY:   [ ]  productive cough   [ ]  asthma   [ ]  wheezing NEUROLOGIC:   [ ]  weakness  [x ] paresthesias  [ ]  aphasia  [ ]  amaurosis  [ x] dizziness HEMATOLOGIC:   [ ]  bleeding problems   [ ]  clotting disorders MUSCULOSKELETAL:  [ ]  joint pain   [ ]  joint swelling GASTROINTESTINAL: [ ]   blood in stool  [ ]   hematemesis GENITOURINARY:  [ ]   dysuria  [ ]   hematuria PSYCHIATRIC:  [ ]  history of major depression INTEGUMENTARY:  [ ]  rashes  [x]  ulcers CONSTITUTIONAL:  [ ]  fever   [ ]  chills  PHYSICAL EXAMINATION:  General: The patient is a well-nourished female, in no acute distress. Vital signs are BP 134/49 mmHg  Pulse 101  Temp(Src) 99 F (37.2 C) (Oral)  Ht 5\' 4"  (1.626 m)  Wt 211 lb (95.709 kg)  BMI 36.20 kg/m2  SpO2 99% Pulmonary: There is a good air exchange   Musculoskeletal: Prosthesis in place on right below-knee amputation. Neurologic: No focal weakness or paresthesias are detected, Skin: Extensive ulceration over the medial first metatarsal head with the apparent bone involvement. Erythema superficially for ulceration over the fifth metatarsal head laterally. Erythema over her mid calf circumferentially with some superficial abrasions. Psychiatric: The patient has normal affect. Cardiovascular: Palpable radial pulses bilaterally. Absent left popliteal and distal pulses   [facility] Vascular Lab Studies:  Ordered and Independently Reviewed diminished flow in left foot with poor visualization related to edema. Does have biphasic flow at the level of the popliteal with a patent superficial femoral artery by duplex.  Impression and Plan:  Very difficult problem. I had a very long discussion with the patient and her friend present. I explained that she could  potentially have a viable partial foot amputation if we could improve blood flow to her foot. I did explain that she may have a non-reconstructable situation. Explain the only option would be for arteriogram to see if she has a lesion amenable to either endovascular stenting or bypass. Her creatinine is 1.6. I did explain the potential risk for renal dysfunction with arteriography. I would proceed with CO2 angiogram to determine if she has treatable lesion. I feel that there is certainly a high likelihood that there is nothing reconstructable. She does wish to proceed with arteriogram for evaluation of any possible chance for healing a partial foot amputation since she is ambulatory with her prosthesis. We will also involve Dr. Ninfa Linden once we have more information regarding her revascularization possibility.    Lekeisha Arenas [facility] Office: [phone]

## 2014-09-08 ENCOUNTER — Encounter (HOSPITAL_COMMUNITY): Payer: Self-pay | Admitting: Vascular Surgery

## 2014-09-08 ENCOUNTER — Encounter (HOSPITAL_COMMUNITY)
Admission: RE | Disposition: A | Discharge status: Home / Self Care | Payer: Self-pay | Source: Ambulatory Visit | Attending: Vascular Surgery

## 2014-09-08 ENCOUNTER — Other Ambulatory Visit: Payer: Self-pay

## 2014-09-08 ENCOUNTER — Ambulatory Visit (HOSPITAL_COMMUNITY)
Admission: RE | Admit: 2014-09-08 | Discharge: 2014-09-08 | Disposition: A | Discharge status: Home / Self Care | Payer: PPO | Source: Ambulatory Visit | Attending: Vascular Surgery | Admitting: Vascular Surgery

## 2014-09-08 DIAGNOSIS — K219 Gastro-esophageal reflux disease without esophagitis: Secondary | ICD-10-CM | POA: Diagnosis not present

## 2014-09-08 DIAGNOSIS — I129 Hypertensive chronic kidney disease with stage 1 through stage 4 chronic kidney disease, or unspecified chronic kidney disease: Secondary | ICD-10-CM | POA: Diagnosis not present

## 2014-09-08 DIAGNOSIS — I2510 Atherosclerotic heart disease of native coronary artery without angina pectoris: Secondary | ICD-10-CM | POA: Insufficient documentation

## 2014-09-08 DIAGNOSIS — Z7982 Long term (current) use of aspirin: Secondary | ICD-10-CM | POA: Diagnosis not present

## 2014-09-08 DIAGNOSIS — G629 Polyneuropathy, unspecified: Secondary | ICD-10-CM | POA: Diagnosis not present

## 2014-09-08 DIAGNOSIS — I771 Stricture of artery: Secondary | ICD-10-CM | POA: Insufficient documentation

## 2014-09-08 DIAGNOSIS — Z87891 Personal history of nicotine dependence: Secondary | ICD-10-CM | POA: Diagnosis not present

## 2014-09-08 DIAGNOSIS — Z888 Allergy status to other drugs, medicaments and biological substances status: Secondary | ICD-10-CM | POA: Insufficient documentation

## 2014-09-08 DIAGNOSIS — E785 Hyperlipidemia, unspecified: Secondary | ICD-10-CM | POA: Diagnosis not present

## 2014-09-08 DIAGNOSIS — Z881 Allergy status to other antibiotic agents status: Secondary | ICD-10-CM | POA: Insufficient documentation

## 2014-09-08 DIAGNOSIS — N183 Chronic kidney disease, stage 3 (moderate): Secondary | ICD-10-CM | POA: Insufficient documentation

## 2014-09-08 DIAGNOSIS — E11319 Type 2 diabetes mellitus with unspecified diabetic retinopathy without macular edema: Secondary | ICD-10-CM | POA: Insufficient documentation

## 2014-09-08 DIAGNOSIS — I96 Gangrene, not elsewhere classified: Secondary | ICD-10-CM | POA: Diagnosis not present

## 2014-09-08 DIAGNOSIS — L97909 Non-pressure chronic ulcer of unspecified part of unspecified lower leg with unspecified severity: Secondary | ICD-10-CM

## 2014-09-08 DIAGNOSIS — I70262 Atherosclerosis of native arteries of extremities with gangrene, left leg: Secondary | ICD-10-CM

## 2014-09-08 DIAGNOSIS — I70299 Other atherosclerosis of native arteries of extremities, unspecified extremity: Secondary | ICD-10-CM | POA: Diagnosis present

## 2014-09-08 HISTORY — PX: ABDOMINAL ANGIOGRAM: SHX5499

## 2014-09-08 LAB — GLUCOSE, CAPILLARY
GLUCOSE-CAPILLARY: 179 mg/dL — AB (ref 70–99)
Glucose-Capillary: 263 mg/dL — ABNORMAL HIGH (ref 70–99)
Glucose-Capillary: 192 mg/dL — ABNORMAL HIGH (ref 70–99)

## 2014-09-08 LAB — POCT I-STAT, CHEM 8
BUN: 57 mg/dL — ABNORMAL HIGH (ref 6–23)
CREATININE: 1.6 mg/dL — AB (ref 0.50–1.10)
Calcium, Ion: 1.34 mmol/L — ABNORMAL HIGH (ref 1.13–1.30)
Chloride: 97 mmol/L (ref 96–112)
GLUCOSE: 277 mg/dL — AB (ref 70–99)
HCT: 37 % (ref 36.0–46.0)
Hemoglobin: 12.6 g/dL (ref 12.0–15.0)
POTASSIUM: 5.3 mmol/L — AB (ref 3.5–5.1)
SODIUM: 133 mmol/L — AB (ref 135–145)
TCO2: 25 mmol/L (ref 0–100)

## 2014-09-08 LAB — POCT ACTIVATED CLOTTING TIME
ACTIVATED CLOTTING TIME: 171 s
ACTIVATED CLOTTING TIME: 214 s

## 2014-09-08 SURGERY — ABDOMINAL ANGIOGRAM
Anesthesia: LOCAL

## 2014-09-08 MED ORDER — MIDAZOLAM HCL 2 MG/2ML IJ SOLN
INTRAMUSCULAR | Status: AC
  Filled 2014-09-08: qty 2

## 2014-09-08 MED ORDER — HEPARIN (PORCINE) IN NACL 2-0.9 UNIT/ML-% IJ SOLN
INTRAMUSCULAR | Status: AC
  Filled 2014-09-08: qty 1000

## 2014-09-08 MED ORDER — SODIUM CHLORIDE 0.9 % IV SOLN
INTRAVENOUS | Status: DC
  Administered 2014-09-08: 1000 mL via INTRAVENOUS

## 2014-09-08 MED ORDER — FENTANYL CITRATE 0.05 MG/ML IJ SOLN
INTRAMUSCULAR | Status: AC
  Filled 2014-09-08: qty 2

## 2014-09-08 MED ORDER — MORPHINE SULFATE 2 MG/ML IJ SOLN: 2.0000 mg | INTRAMUSCULAR | Status: DC | PRN

## 2014-09-08 MED ORDER — OXYCODONE-ACETAMINOPHEN 5-325 MG PO TABS: 1.0000 | ORAL_TABLET | ORAL | Status: DC | PRN

## 2014-09-08 MED ORDER — LIDOCAINE HCL (PF) 1 % IJ SOLN
INTRAMUSCULAR | Status: AC
  Filled 2014-09-08: qty 30

## 2014-09-08 MED ORDER — SODIUM CHLORIDE 0.9 % IV SOLN
1.0000 mL/kg/h | INTRAVENOUS | Status: DC
  Administered 2014-09-08: 1 mL/kg/h via INTRAVENOUS

## 2014-09-08 NOTE — Interval H&P Note (Signed)
[facility]  History and Physical Update  The patient was interviewed and re-examined.  The patient's previous History and Physical has been reviewed and is unchanged from Dr. Luther Parody consult.  There is no change in the plan of care: Left leg runoff, possible intervention.  Adele Barthel, MD [facility] Office: [phone] Pager: [phone]  09/08/2014, 8:13 AM

## 2014-09-08 NOTE — Progress Notes (Signed)
Sheath Pull  Site area: 6Fr 45cm arterial sheath removed from right femoral artery  Site Prior to Removal: level 0  Pressure Applied For 20 MINUTES   Minutes Beginning at 1153  Manual: Yes   Patient Status During Pull: Without complaints, vitals stable  Post Pull Groin Site:Level 0  Post Pull Instructions Given:Yes; hold pressure if sneezing, coughing or laughing, keep right leg straight and maintain bedrest for 6 hours, if noted any change to site or pain hold pressure and call for assistance.  Post Pull Pulses Present:Present, BKA but palpable behind knee  Dressing Applied:Yes, gauze and clear tegaderm

## 2014-09-08 NOTE — Op Note (Signed)
OPERATIVE NOTE   PROCEDURE: 1.  right common femoral artery cannulation under ultrasound guidance 2.  Placement of catheter in aorta 3.  Aortogram with carbon dioxide 4.  Second order arterial selection 5.  Left leg runoff (carbon dioxide and IV contrast) 6.  Failed attempt to cross left popliteal artery occlusion  PRE-OPERATIVE DIAGNOSIS: left foot gangrene  POST-OPERATIVE DIAGNOSIS: same as above   SURGEON: Adele Barthel, MD  ANESTHESIA: conscious sedation  ESTIMATED BLOOD LOSS: 50 cc  CONTRAST: 65 cc (IV contrast)  FINDING(S):  Aorta: patent   Superior mesenteric artery: patent Celiac artery: patent   Right Left  RA patent patent  CIA patent patent  EIA patent patent  IIA patent patent  CFA patent patent  SFA  Patent, occludes distally  PFA  Patent  Pop  Occluded proximally, with diminutive residual lumen  Trif  Patent, aberrant takeoff above knee of peroneal and anterior tibial artery   AT  Aberrant takeoff, Patent but diminutive  Pero  Aberrant, takeoff, Patent, dominant runoff  PT  Occluded   SPECIMEN(S):  none  INDICATIONS:   [name] is a 74 y.o. female who presents with left foot gangrene.  The patient presents for: aortogram, left leg runoff, and possible intervention.  I discussed with the patient the nature of angiographic procedures, especially the limited patencies of any endovascular intervention.  The patient is aware of that the risks of an angiographic procedure include but are not limited to: bleeding, infection, access site complications, renal failure, embolization, rupture of vessel, dissection, possible need for emergent surgical intervention, possible need for surgical procedures to treat the patient's pathology, and stroke and death.  The patient is aware of the risks and agrees to proceed.  DESCRIPTION: After full informed consent was obtained from the patient, the patient was brought back to the angiography suite.  The patient was  placed supine upon the angiography table and connected to monitoring equipment.  The patient was then given conscious sedation, the amounts of which are documented in the patient's chart.  The patient was prepped and drape in the standard fashion for an angiographic procedure.  At this point, attention was turned to the right groin.  Under ultrasound guidance, the right common femoral artery was cannulated with a micropuncture needle.  The microwire was advanced into the iliac arterial system.  The needle was exchanged for a microsheath, which was loaded into the common femoral artery over the wire.  The microwire was exchanged for a [facility] wire which was advanced into the aorta.  The microsheath was then exchanged for a 5-Fr sheath which was loaded into the common femoral artery.  The Omniflush catheter was then loaded over the wire up to the level of L1.  The catheter was connected to the carbon dioxide circuit.  After de-airring and de-clotting the circuit, a carbon dioxide aortogram was completed.  The findings are listed above.  The [facility] wire was replaced in the catheter, and using the [place] and Omniflush catheter, the left common iliac artery was selected.  The catheter and wire were advanced into the external iliac artery.  The wire was removed and the catheter connected to the carbon dioxide circuit.  The left leg was imaged in stations with carbon dioxide down to left knee.  The catheter was connected to the power injector and two power injections completed to image the remains portions of the left leg.    Based on the images, I felt an attempt at recannulation  and angioplasty was reasonable.  The wire was exchanged for a Rosen wire.  The patient's right femoral sheath was exchanged for a 6-Fr Destination sheath, which was lodged in the left common femoral artery.  The dilator was removed.  The patient was given 8000 units of Heparin intravenously, which was a therapeutic bolus.  I loaded a Sleek  catheter over the wire and exchanged the wire for a long Glidewire.  Using this combination, I was able to get to the distal superficial femoral artery/popliteal artery occlusion.  Unfortunately, hypertrophied collaterals made it impossible to cross the occlusion at this level.  I removed the catheter and wire and pulled the sheath back into the right external iliac artery.  The sheath was aspirated.  No clots were present and the sheath was reloaded with heparinized saline.  The right sheath will be removed once anticoagulation has resolved.  Based on the images, this patient should be considered for a left femoral to popliteal vs peroneal bypass (aberrant anatomy).  COMPLICATIONS: none  CONDITION: stable  Adele Barthel, MD [facility] Office: [phone] Pager: [phone]  09/08/2014, 9:39 AM

## 2014-09-08 NOTE — Discharge Instructions (Signed)

## 2014-09-08 NOTE — H&P (View-Only) (Signed)
Patient name: [name] MRN: [id number] DOB: Apr 08, 1941 Sex: female   Referred by: Drema Dallas  Reason for referral:  Chief Complaint  Patient presents with  . New Evaluation    vascular consult    HISTORY OF PRESENT ILLNESS: The patient is seen today for evaluation of gangrenous changes of her left foot. She is very pleasant 74 year old with a very complex past medical history. She did have nonhealing ulcerations of her right foot in 2008 and had a partial foot amputation and subsequently a below-knee amputation by Dr. Ninfa Linden. She has had issues with wound problems in her left foot since the last 6-8 months. She did have healing of this and then has had recurrences. She does have neuropathy with not much pain associated with her left foot. She does walk with a right leg prosthesis. The mortise changes of her left great toe been progressive and she is developing changes in her right foot as well. She did undergo an MRI yesterday which I have reviewed as well. Her past history is significant for bladder cancer. She had a resection of this with an extensive surgery at [facility] and had a myocardial infarction following this. She also has a history of renal insufficiency but has not been on hemodialysis and does have stage III chronic renal insufficiency  Past Medical History  Diagnosis Date  . Hypertension   . Hyperlipidemia   . History of atrial tachycardia CONTROLLED W/ LOPRESSOR  . Peripheral vascular disease S/P RIGHT BKA  . Headache(784.0)   . Peripheral neuropathy LEGS AND HANDS  . Arthritis   . S/P BKA (below knee amputation) unilateral RIGHT  . GERD (gastroesophageal reflux disease)   . Anemia   . Blood transfusion   . Diabetes mellitus ORAL AND INSULIN MEDS  . Macular degeneration of both eyes   . Glaucoma   . Retinopathy due to secondary diabetes mellitus   . Insomnia   . Constipation   . History of bladder cancer TCC AND CIS  . MI (mitral  incompetence)   . Coronary artery disease   . Chronic kidney disease   . Cancer     bladder    Past Surgical History  Procedure Laterality Date  . I & d right below knee amputation wound  06-10-2011  . Cysto / resection bladder bx's  04-01-11  &  11-26-10  . Below knee leg amputation  05-02-2007    RIGHT  . Right foot i & d / removal necrotic bone  JUN  &  AUG 2008  . Right great toe amputation  11-05-2006  . Laparoscopic cholecystectomy  12-10-1999  . Back surgery  [place]  . Appendectomy  1963  . Cataract extraction w/ intraocular lens  implant, bilateral    . Cystoscopy with biopsy  10/14/2011    Procedure: CYSTOSCOPY WITH BIOPSY;  Surgeon: Claybon Jabs, MD;  Location[facility];  Service: Urology;  Laterality: N/A;  BLADDER BIOPSY  . Coronary stent placement    . Abdominal hysterectomy    . Revision urostomy cutaneous    . Revision urostomy cutaneous      History   Social History  . Marital Status: Legally Separated    Spouse Name: N/A    Number of Children: N/A  . Years of Education: N/A   Occupational History  . Not on file.   Social History Main Topics  . Smoking status: Former Smoker -- 2.00  packs/day for 17 years    Types: Cigarettes    Quit date: 10/09/1975  . Smokeless tobacco: Never Used  . Alcohol Use: 0.0 oz/week    0 Not specified per week     Comment: RARE  . Drug Use: No  . Sexual Activity: Not on file   Other Topics Concern  . Not on file   Social History Narrative    Family History  Problem Relation Age of Onset  . Heart failure Mother     died age 55  . Sudden death Father 72  . Heart disease Father   . Hypertension Father   . Heart attack Father   . Diabetes Brother   . Heart attack Brother   . AAA (abdominal aortic aneurysm) Brother     Allergies as of 09/06/2014 - Review Complete 09/06/2014  Allergen Reaction Noted  . Clarithromycin Itching 10/09/2011  . Lisinopril Other (See Comments) 11/14/2012  .  Metformin and related Other (See Comments) 11/14/2012    Current Outpatient Prescriptions on File Prior to Visit  Medication Sig Dispense Refill  . acetaminophen (TYLENOL) 500 MG tablet Take 1,000 mg by mouth as needed for pain.    Marland Kitchen aspirin 81 MG chewable tablet Chew 81 mg by mouth at bedtime.     Marland Kitchen atorvastatin (LIPITOR) 80 MG tablet Take 1 tablet (80 mg total) by mouth every evening. 90 tablet 2  . clopidogrel (PLAVIX) 75 MG tablet TAKE ONE TABLET BY MOUTH DAILY 30 tablet 0  . furosemide (LASIX) 40 MG tablet Take 80 mg by mouth 2 (two) times daily.     . insulin glargine (LANTUS) 100 UNIT/ML injection Inject 55-60 Units into the skin 2 (two) times daily. 55 units in the morning and 60 units at night    . insulin lispro (HUMALOG) 100 UNIT/ML injection Inject 20-50 Units into the skin daily as needed for high blood sugar. PER SLIDING SCALE    . isosorbide mononitrate (IMDUR) 30 MG 24 hr tablet Take 1 tablet (30 mg total) by mouth daily. 30 tablet 11  . Melatonin 10 MG TABS Take 10 mg by mouth at bedtime.     . metoprolol succinate (TOPROL-XL) 100 MG 24 hr tablet TAKE 1 TABLET (100 MG TOTAL) BY MOUTH EVERY EVENING. TAKE WITH OR IMMEDIATELY FOLLOWING A MEAL. 90 tablet 1  . Multiple Vitamins-Minerals (PRESERVISION AREDS PO) Take 1 capsule by mouth 2 (two) times daily.    . nitroGLYCERIN (NITROSTAT) 0.4 MG SL tablet Place 0.4 mg under the tongue every 5 (five) minutes as needed for chest pain.    Marland Kitchen omeprazole (PRILOSEC) 20 MG capsule Take 20 mg by mouth daily as needed (gurd).     . polyethylene glycol (MIRALAX / GLYCOLAX) packet Take 17 g by mouth daily. 14 each 0  . potassium chloride SA (K-DUR,KLOR-CON) 20 MEQ tablet Take 20 mEq by mouth daily.     . Psyllium (VEGETABLE LAXATIVE PO) Take 4 tablets by mouth at bedtime.    . SODIUM BICARBONATE PO Take 20 g by mouth 2 (two) times daily.     No current facility-administered medications on file prior to visit.     REVIEW OF  SYSTEMS:  Positives indicated with an "X"  CARDIOVASCULAR:  [ ]  chest pain   [ ]  chest pressure   [ ]  palpitations   [ ]  orthopnea   [ ]  dyspnea on exertion   [ ]  claudication   [ ]  rest pain   [ ]  DVT   [ ]   phlebitis PULMONARY:   [ ]  productive cough   [ ]  asthma   [ ]  wheezing NEUROLOGIC:   [ ]  weakness  [x ] paresthesias  [ ]  aphasia  [ ]  amaurosis  [ x] dizziness HEMATOLOGIC:   [ ]  bleeding problems   [ ]  clotting disorders MUSCULOSKELETAL:  [ ]  joint pain   [ ]  joint swelling GASTROINTESTINAL: [ ]   blood in stool  [ ]   hematemesis GENITOURINARY:  [ ]   dysuria  [ ]   hematuria PSYCHIATRIC:  [ ]  history of major depression INTEGUMENTARY:  [ ]  rashes  [x]  ulcers CONSTITUTIONAL:  [ ]  fever   [ ]  chills  PHYSICAL EXAMINATION:  General: The patient is a well-nourished female, in no acute distress. Vital signs are BP 134/49 mmHg  Pulse 101  Temp(Src) 99 F (37.2 C) (Oral)  Ht 5\' 4"  (1.626 m)  Wt 211 lb (95.709 kg)  BMI 36.20 kg/m2  SpO2 99% Pulmonary: There is a good air exchange   Musculoskeletal: Prosthesis in place on right below-knee amputation. Neurologic: No focal weakness or paresthesias are detected, Skin: Extensive ulceration over the medial first metatarsal head with the apparent bone involvement. Erythema superficially for ulceration over the fifth metatarsal head laterally. Erythema over her mid calf circumferentially with some superficial abrasions. Psychiatric: The patient has normal affect. Cardiovascular: Palpable radial pulses bilaterally. Absent left popliteal and distal pulses   [facility] Vascular Lab Studies:  Ordered and Independently Reviewed diminished flow in left foot with poor visualization related to edema. Does have biphasic flow at the level of the popliteal with a patent superficial femoral artery by duplex.  Impression and Plan:  Very difficult problem. I had a very long discussion with the patient and her friend present. I explained that she could  potentially have a viable partial foot amputation if we could improve blood flow to her foot. I did explain that she may have a non-reconstructable situation. Explain the only option would be for arteriogram to see if she has a lesion amenable to either endovascular stenting or bypass. Her creatinine is 1.6. I did explain the potential risk for renal dysfunction with arteriography. I would proceed with CO2 angiogram to determine if she has treatable lesion. I feel that there is certainly a high likelihood that there is nothing reconstructable. She does wish to proceed with arteriogram for evaluation of any possible chance for healing a partial foot amputation since she is ambulatory with her prosthesis. We will also involve Dr. Ninfa Linden once we have more information regarding her revascularization possibility.    Takeya Marquis [facility] Office: [phone]

## 2014-09-09 ENCOUNTER — Encounter (HOSPITAL_COMMUNITY): Payer: Self-pay | Admitting: *Deleted

## 2014-09-09 LAB — POCT ACTIVATED CLOTTING TIME: Activated Clotting Time: 288 seconds

## 2014-09-09 NOTE — Progress Notes (Signed)
Pt denies SOB and chest pain but is under the care of Dr. Marlou Porch, cardiology. Pt Made aware to stop otc vitamins,  herbal medications ( melatonin, Fish oil) and NSAIDs.. Pt stated that her last dose of Plavix was this am (09/09/14)  and  that she takes her Aspirin in the evening.

## 2014-09-09 NOTE — Progress Notes (Signed)
Pt made aware to not take any insulin the morning of procedure. Pt stated that she was instructed by MD's staff to take half dose of insulin the night before procedure. Pt verbalized understanding of instructions.

## 2014-09-11 MED ORDER — DEXTROSE 5 % IV SOLN
1.5000 g | INTRAVENOUS | Status: AC
  Administered 2014-09-12: 1.5 g via INTRAVENOUS
  Filled 2014-09-11: qty 1.5

## 2014-09-12 ENCOUNTER — Inpatient Hospital Stay (HOSPITAL_COMMUNITY): Payer: PPO | Admitting: Anesthesiology

## 2014-09-12 ENCOUNTER — Encounter (HOSPITAL_COMMUNITY): Payer: Self-pay | Admitting: *Deleted

## 2014-09-12 ENCOUNTER — Encounter (HOSPITAL_COMMUNITY)
Admission: RE | Disposition: A | Discharge status: Skilled Nursing Facility | Payer: Self-pay | Source: Ambulatory Visit | Attending: Vascular Surgery

## 2014-09-12 ENCOUNTER — Inpatient Hospital Stay (HOSPITAL_COMMUNITY)
Admission: RE | Admit: 2014-09-12 | Discharge: 2014-09-20 | DRG: 253 | Disposition: A | Discharge status: Skilled Nursing Facility | Payer: PPO | Source: Ambulatory Visit | Attending: Vascular Surgery | Admitting: Vascular Surgery

## 2014-09-12 DIAGNOSIS — E860 Dehydration: Secondary | ICD-10-CM | POA: Diagnosis not present

## 2014-09-12 DIAGNOSIS — M10072 Idiopathic gout, left ankle and foot: Secondary | ICD-10-CM | POA: Diagnosis present

## 2014-09-12 DIAGNOSIS — E1165 Type 2 diabetes mellitus with hyperglycemia: Secondary | ICD-10-CM | POA: Diagnosis present

## 2014-09-12 DIAGNOSIS — I252 Old myocardial infarction: Secondary | ICD-10-CM

## 2014-09-12 DIAGNOSIS — L97529 Non-pressure chronic ulcer of other part of left foot with unspecified severity: Secondary | ICD-10-CM | POA: Diagnosis present

## 2014-09-12 DIAGNOSIS — N183 Chronic kidney disease, stage 3 unspecified: Secondary | ICD-10-CM | POA: Diagnosis present

## 2014-09-12 DIAGNOSIS — E785 Hyperlipidemia, unspecified: Secondary | ICD-10-CM | POA: Diagnosis present

## 2014-09-12 DIAGNOSIS — I70262 Atherosclerosis of native arteries of extremities with gangrene, left leg: Principal | ICD-10-CM | POA: Diagnosis present

## 2014-09-12 DIAGNOSIS — G629 Polyneuropathy, unspecified: Secondary | ICD-10-CM | POA: Diagnosis present

## 2014-09-12 DIAGNOSIS — I739 Peripheral vascular disease, unspecified: Secondary | ICD-10-CM | POA: Diagnosis not present

## 2014-09-12 DIAGNOSIS — Z89511 Acquired absence of right leg below knee: Secondary | ICD-10-CM | POA: Diagnosis not present

## 2014-09-12 DIAGNOSIS — Z8551 Personal history of malignant neoplasm of bladder: Secondary | ICD-10-CM

## 2014-09-12 DIAGNOSIS — H3500 Unspecified background retinopathy: Secondary | ICD-10-CM | POA: Diagnosis not present

## 2014-09-12 DIAGNOSIS — Z7902 Long term (current) use of antithrombotics/antiplatelets: Secondary | ICD-10-CM

## 2014-09-12 DIAGNOSIS — K219 Gastro-esophageal reflux disease without esophagitis: Secondary | ICD-10-CM | POA: Diagnosis not present

## 2014-09-12 DIAGNOSIS — Z87891 Personal history of nicotine dependence: Secondary | ICD-10-CM | POA: Diagnosis not present

## 2014-09-12 DIAGNOSIS — I129 Hypertensive chronic kidney disease with stage 1 through stage 4 chronic kidney disease, or unspecified chronic kidney disease: Secondary | ICD-10-CM | POA: Diagnosis present

## 2014-09-12 DIAGNOSIS — Z794 Long term (current) use of insulin: Secondary | ICD-10-CM | POA: Diagnosis not present

## 2014-09-12 DIAGNOSIS — H3530 Unspecified macular degeneration: Secondary | ICD-10-CM | POA: Diagnosis not present

## 2014-09-12 DIAGNOSIS — D62 Acute posthemorrhagic anemia: Secondary | ICD-10-CM | POA: Diagnosis not present

## 2014-09-12 DIAGNOSIS — I2510 Atherosclerotic heart disease of native coronary artery without angina pectoris: Secondary | ICD-10-CM | POA: Diagnosis not present

## 2014-09-12 DIAGNOSIS — R000 Tachycardia, unspecified: Secondary | ICD-10-CM | POA: Diagnosis not present

## 2014-09-12 DIAGNOSIS — Z7982 Long term (current) use of aspirin: Secondary | ICD-10-CM

## 2014-09-12 DIAGNOSIS — L97909 Non-pressure chronic ulcer of unspecified part of unspecified lower leg with unspecified severity: Secondary | ICD-10-CM

## 2014-09-12 DIAGNOSIS — L97429 Non-pressure chronic ulcer of left heel and midfoot with unspecified severity: Secondary | ICD-10-CM | POA: Diagnosis present

## 2014-09-12 DIAGNOSIS — I96 Gangrene, not elsewhere classified: Secondary | ICD-10-CM

## 2014-09-12 DIAGNOSIS — S81809A Unspecified open wound, unspecified lower leg, initial encounter: Secondary | ICD-10-CM | POA: Diagnosis present

## 2014-09-12 DIAGNOSIS — H409 Unspecified glaucoma: Secondary | ICD-10-CM | POA: Diagnosis not present

## 2014-09-12 DIAGNOSIS — I70299 Other atherosclerosis of native arteries of extremities, unspecified extremity: Secondary | ICD-10-CM

## 2014-09-12 DIAGNOSIS — D649 Anemia, unspecified: Secondary | ICD-10-CM

## 2014-09-12 DIAGNOSIS — I471 Supraventricular tachycardia: Secondary | ICD-10-CM | POA: Diagnosis not present

## 2014-09-12 HISTORY — PX: FEMORAL-POPLITEAL BYPASS GRAFT: SHX937

## 2014-09-12 HISTORY — DX: Acute myocardial infarction, unspecified: I21.9

## 2014-09-12 LAB — COMPREHENSIVE METABOLIC PANEL
ALT: 15 U/L (ref 0–35)
AST: 22 U/L (ref 0–37)
Albumin: 2.8 g/dL — ABNORMAL LOW (ref 3.5–5.2)
Alkaline Phosphatase: 144 U/L — ABNORMAL HIGH (ref 39–117)
Anion gap: 10 (ref 5–15)
BILIRUBIN TOTAL: 0.3 mg/dL (ref 0.3–1.2)
BUN: 48 mg/dL — ABNORMAL HIGH (ref 6–23)
CHLORIDE: 96 mmol/L (ref 96–112)
CO2: 26 mmol/L (ref 19–32)
Calcium: 10.6 mg/dL — ABNORMAL HIGH (ref 8.4–10.5)
Creatinine, Ser: 1.71 mg/dL — ABNORMAL HIGH (ref 0.50–1.10)
GFR calc Af Amer: 33 mL/min — ABNORMAL LOW (ref >90)
GFR calc non Af Amer: 28 mL/min — ABNORMAL LOW (ref >90)
GLUCOSE: 206 mg/dL — AB (ref 70–99)
Potassium: 5 mmol/L (ref 3.5–5.1)
Sodium: 132 mmol/L — ABNORMAL LOW (ref 135–145)
Total Protein: 7 g/dL (ref 6.0–8.3)

## 2014-09-12 LAB — CBC
HCT: 33.8 % — ABNORMAL LOW (ref 36.0–46.0)
HEMOGLOBIN: 10.8 g/dL — AB (ref 12.0–15.0)
MCH: 26.1 pg (ref 26.0–34.0)
MCHC: 32 g/dL (ref 30.0–36.0)
MCV: 81.6 fL (ref 78.0–100.0)
Platelets: 299 10*3/uL (ref 150–400)
RBC: 4.14 MIL/uL (ref 3.87–5.11)
RDW: 13.8 % (ref 11.5–15.5)
WBC: 9 10*3/uL (ref 4.0–10.5)

## 2014-09-12 LAB — TYPE AND SCREEN
ABO/RH(D): O POS
ANTIBODY SCREEN: NEGATIVE

## 2014-09-12 LAB — URINE MICROSCOPIC-ADD ON

## 2014-09-12 LAB — URINALYSIS, ROUTINE W REFLEX MICROSCOPIC
Bilirubin Urine: NEGATIVE
Glucose, UA: 100 mg/dL — AB
Ketones, ur: NEGATIVE mg/dL
Nitrite: NEGATIVE
Protein, ur: 100 mg/dL — AB
Specific Gravity, Urine: 1.014 (ref 1.005–1.030)
UROBILINOGEN UA: 0.2 mg/dL (ref 0.0–1.0)
pH: 7.5 (ref 5.0–8.0)

## 2014-09-12 LAB — SURGICAL PCR SCREEN
MRSA, PCR: NEGATIVE
Staphylococcus aureus: NEGATIVE

## 2014-09-12 LAB — POCT I-STAT GLUCOSE
Glucose, Bld: 74 mg/dL (ref 70–99)
Glucose, Bld: 94 mg/dL (ref 70–99)
Operator id: 190282
Operator id: 190282

## 2014-09-12 LAB — GLUCOSE, CAPILLARY
GLUCOSE-CAPILLARY: 253 mg/dL — AB (ref 70–99)
GLUCOSE-CAPILLARY: 112 mg/dL — AB (ref 70–99)
Glucose-Capillary: 113 mg/dL — ABNORMAL HIGH (ref 70–99)
Glucose-Capillary: 120 mg/dL — ABNORMAL HIGH (ref 70–99)

## 2014-09-12 LAB — PROTIME-INR
INR: 1.04 (ref 0.00–1.49)
PROTHROMBIN TIME: 13.7 s (ref 11.6–15.2)

## 2014-09-12 LAB — APTT: APTT: 29 s (ref 24–37)

## 2014-09-12 LAB — POCT I-STAT 4, (NA,K, GLUC, HGB,HCT)
GLUCOSE: 127 mg/dL — AB (ref 70–99)
HCT: 28 % — ABNORMAL LOW (ref 36.0–46.0)
Hemoglobin: 9.5 g/dL — ABNORMAL LOW (ref 12.0–15.0)
POTASSIUM: 4.4 mmol/L (ref 3.5–5.1)
Sodium: 133 mmol/L — ABNORMAL LOW (ref 135–145)

## 2014-09-12 SURGERY — BYPASS GRAFT FEMORAL-POPLITEAL ARTERY
Anesthesia: General | Site: Leg Upper | Laterality: Left

## 2014-09-12 MED ORDER — INSULIN GLARGINE 100 UNIT/ML ~~LOC~~ SOLN
55.0000 [IU] | Freq: Every day | SUBCUTANEOUS | Status: DC
  Administered 2014-09-13 – 2014-09-20 (×6): 55 [IU] via SUBCUTANEOUS
  Filled 2014-09-12 (×11): qty 0.55

## 2014-09-12 MED ORDER — INSULIN GLARGINE 100 UNIT/ML ~~LOC~~ SOLN
60.0000 [IU] | Freq: Every day | SUBCUTANEOUS | Status: DC
  Administered 2014-09-12 – 2014-09-19 (×7): 60 [IU] via SUBCUTANEOUS
  Filled 2014-09-12 (×10): qty 0.6

## 2014-09-12 MED ORDER — PANTOPRAZOLE SODIUM 40 MG PO TBEC
40.0000 mg | DELAYED_RELEASE_TABLET | Freq: Every day | ORAL | Status: DC
  Administered 2014-09-12 – 2014-09-20 (×9): 40 mg via ORAL
  Filled 2014-09-12 (×8): qty 1

## 2014-09-12 MED ORDER — SUCCINYLCHOLINE CHLORIDE 20 MG/ML IJ SOLN
INTRAMUSCULAR | Status: AC
  Filled 2014-09-12: qty 1

## 2014-09-12 MED ORDER — POTASSIUM CHLORIDE CRYS ER 20 MEQ PO TBCR: 20.0000 meq | EXTENDED_RELEASE_TABLET | Freq: Every day | ORAL | Status: DC | PRN

## 2014-09-12 MED ORDER — DOCUSATE SODIUM 100 MG PO CAPS
100.0000 mg | ORAL_CAPSULE | Freq: Every day | ORAL | Status: DC
  Administered 2014-09-13 – 2014-09-20 (×8): 100 mg via ORAL
  Filled 2014-09-12 (×8): qty 1

## 2014-09-12 MED ORDER — ACETAMINOPHEN 325 MG PO TABS
325.0000 mg | ORAL_TABLET | ORAL | Status: DC | PRN
  Administered 2014-09-13 – 2014-09-17 (×9): 650 mg via ORAL
  Filled 2014-09-12 (×9): qty 2

## 2014-09-12 MED ORDER — GLYCOPYRROLATE 0.2 MG/ML IJ SOLN
INTRAMUSCULAR | Status: AC
  Filled 2014-09-12: qty 3

## 2014-09-12 MED ORDER — ONDANSETRON HCL 4 MG/2ML IJ SOLN: 4.0000 mg | Freq: Once | INTRAMUSCULAR | Status: DC | PRN

## 2014-09-12 MED ORDER — SODIUM CHLORIDE 0.9 % IR SOLN
Status: DC | PRN
  Administered 2014-09-12: 500 mL
  Administered 2014-09-12: 12:00:00

## 2014-09-12 MED ORDER — SODIUM CHLORIDE 0.9 % IV SOLN
INTRAVENOUS | Status: DC
  Administered 2014-09-12: 12:00:00 via INTRAVENOUS
  Administered 2014-09-12: 10 mL/h via INTRAVENOUS
  Administered 2014-09-12: 16:00:00 via INTRAVENOUS

## 2014-09-12 MED ORDER — METOPROLOL TARTRATE 1 MG/ML IV SOLN: 2.0000 mg | INTRAVENOUS | Status: DC | PRN

## 2014-09-12 MED ORDER — CHLORHEXIDINE GLUCONATE CLOTH 2 % EX PADS: 6.0000 | MEDICATED_PAD | Freq: Once | CUTANEOUS | Status: DC

## 2014-09-12 MED ORDER — OXYCODONE HCL 5 MG PO TABS
5.0000 mg | ORAL_TABLET | ORAL | Status: DC | PRN
  Administered 2014-09-12: 5 mg via ORAL
  Administered 2014-09-13 – 2014-09-16 (×6): 10 mg via ORAL
  Administered 2014-09-16 – 2014-09-17 (×2): 5 mg via ORAL
  Administered 2014-09-17: 10 mg via ORAL
  Administered 2014-09-18 – 2014-09-20 (×7): 5 mg via ORAL
  Filled 2014-09-12: qty 1
  Filled 2014-09-12: qty 2
  Filled 2014-09-12: qty 1
  Filled 2014-09-12 (×3): qty 2
  Filled 2014-09-12 (×4): qty 1
  Filled 2014-09-12: qty 2
  Filled 2014-09-12: qty 1
  Filled 2014-09-12 (×4): qty 2
  Filled 2014-09-12 (×2): qty 1

## 2014-09-12 MED ORDER — FENTANYL CITRATE 0.05 MG/ML IJ SOLN
INTRAMUSCULAR | Status: DC | PRN
  Administered 2014-09-12: 25 ug via INTRAVENOUS
  Administered 2014-09-12 (×2): 50 ug via INTRAVENOUS
  Administered 2014-09-12: 25 ug via INTRAVENOUS
  Administered 2014-09-12: 150 ug via INTRAVENOUS

## 2014-09-12 MED ORDER — NITROGLYCERIN 0.4 MG SL SUBL: 0.4000 mg | SUBLINGUAL_TABLET | SUBLINGUAL | Status: DC | PRN

## 2014-09-12 MED ORDER — ATORVASTATIN CALCIUM 80 MG PO TABS
80.0000 mg | ORAL_TABLET | Freq: Every evening | ORAL | Status: DC
  Administered 2014-09-12 – 2014-09-19 (×7): 80 mg via ORAL
  Filled 2014-09-12 (×9): qty 1

## 2014-09-12 MED ORDER — PHENOL 1.4 % MT LIQD: 1.0000 | OROMUCOSAL | Status: DC | PRN

## 2014-09-12 MED ORDER — ONDANSETRON HCL 4 MG/2ML IJ SOLN: 4.0000 mg | Freq: Four times a day (QID) | INTRAMUSCULAR | Status: DC | PRN

## 2014-09-12 MED ORDER — ISOSORBIDE MONONITRATE ER 30 MG PO TB24
30.0000 mg | ORAL_TABLET | Freq: Every day | ORAL | Status: DC
  Administered 2014-09-14 – 2014-09-20 (×6): 30 mg via ORAL
  Filled 2014-09-12 (×8): qty 1

## 2014-09-12 MED ORDER — DEXTROSE 5 % IV SOLN
1.5000 g | Freq: Two times a day (BID) | INTRAVENOUS | Status: AC
  Administered 2014-09-13 (×2): 1.5 g via INTRAVENOUS
  Filled 2014-09-12 (×2): qty 1.5

## 2014-09-12 MED ORDER — PROTAMINE SULFATE 10 MG/ML IV SOLN
INTRAVENOUS | Status: AC
Start: 2014-09-12 — End: 2014-09-12
  Filled 2014-09-12: qty 10

## 2014-09-12 MED ORDER — DEXTROSE 50 % IV SOLN
INTRAVENOUS | Status: AC
  Filled 2014-09-12: qty 50

## 2014-09-12 MED ORDER — EPHEDRINE SULFATE 50 MG/ML IJ SOLN
INTRAMUSCULAR | Status: DC | PRN
  Administered 2014-09-12: 10 mg via INTRAVENOUS

## 2014-09-12 MED ORDER — SODIUM CHLORIDE 0.9 % IV SOLN: 500.0000 mL | Freq: Once | INTRAVENOUS | Status: AC | PRN

## 2014-09-12 MED ORDER — SODIUM CHLORIDE 0.9 % IV SOLN
10.0000 mg | INTRAVENOUS | Status: DC | PRN
  Administered 2014-09-12: 40 ug/min via INTRAVENOUS

## 2014-09-12 MED ORDER — HEPARIN SODIUM (PORCINE) 1000 UNIT/ML IJ SOLN
INTRAMUSCULAR | Status: AC
  Filled 2014-09-12: qty 2

## 2014-09-12 MED ORDER — HYDROMORPHONE HCL 1 MG/ML IJ SOLN
INTRAMUSCULAR | Status: AC
  Administered 2014-09-12: 0.5 mg via INTRAVENOUS
  Filled 2014-09-12: qty 1

## 2014-09-12 MED ORDER — ONDANSETRON HCL 4 MG/2ML IJ SOLN
INTRAMUSCULAR | Status: AC
  Filled 2014-09-12: qty 2

## 2014-09-12 MED ORDER — 0.9 % SODIUM CHLORIDE (POUR BTL) OPTIME
TOPICAL | Status: DC | PRN
  Administered 2014-09-12: 3000 mL

## 2014-09-12 MED ORDER — CLOPIDOGREL BISULFATE 75 MG PO TABS
75.0000 mg | ORAL_TABLET | Freq: Every day | ORAL | Status: DC
  Administered 2014-09-15 – 2014-09-20 (×6): 75 mg via ORAL
  Filled 2014-09-12 (×7): qty 1

## 2014-09-12 MED ORDER — FENTANYL CITRATE 0.05 MG/ML IJ SOLN
INTRAMUSCULAR | Status: AC
  Filled 2014-09-12: qty 5

## 2014-09-12 MED ORDER — SODIUM CHLORIDE 0.9 % IV SOLN
INTRAVENOUS | Status: DC
  Administered 2014-09-14 (×2): via INTRAVENOUS

## 2014-09-12 MED ORDER — FUROSEMIDE 80 MG PO TABS
80.0000 mg | ORAL_TABLET | Freq: Two times a day (BID) | ORAL | Status: DC
  Administered 2014-09-12 – 2014-09-16 (×7): 80 mg via ORAL
  Filled 2014-09-12 (×10): qty 1

## 2014-09-12 MED ORDER — PROTAMINE SULFATE 10 MG/ML IV SOLN
INTRAVENOUS | Status: DC | PRN
  Administered 2014-09-12: 50 mg via INTRAVENOUS

## 2014-09-12 MED ORDER — POLYETHYLENE GLYCOL 3350 17 G PO PACK
17.0000 g | PACK | Freq: Every day | ORAL | Status: DC
  Administered 2014-09-13 – 2014-09-20 (×7): 17 g via ORAL
  Filled 2014-09-12 (×10): qty 1

## 2014-09-12 MED ORDER — INSULIN GLARGINE 100 UNIT/ML ~~LOC~~ SOLN: 55.0000 [IU] | Freq: Two times a day (BID) | SUBCUTANEOUS | Status: DC

## 2014-09-12 MED ORDER — MUPIROCIN 2 % EX OINT
TOPICAL_OINTMENT | CUTANEOUS | Status: AC
  Filled 2014-09-12: qty 22

## 2014-09-12 MED ORDER — HEPARIN SODIUM (PORCINE) 1000 UNIT/ML IJ SOLN
INTRAMUSCULAR | Status: DC | PRN
  Administered 2014-09-12: 8000 [IU] via INTRAVENOUS

## 2014-09-12 MED ORDER — LIDOCAINE HCL (CARDIAC) 20 MG/ML IV SOLN
INTRAVENOUS | Status: DC | PRN
  Administered 2014-09-12: 100 mg via INTRAVENOUS

## 2014-09-12 MED ORDER — PROPOFOL 10 MG/ML IV BOLUS
INTRAVENOUS | Status: AC
  Filled 2014-09-12: qty 20

## 2014-09-12 MED ORDER — HYDRALAZINE HCL 20 MG/ML IJ SOLN: 5.0000 mg | INTRAMUSCULAR | Status: DC | PRN

## 2014-09-12 MED ORDER — MUPIROCIN 2 % EX OINT
1.0000 | TOPICAL_OINTMENT | Freq: Once | CUTANEOUS | Status: AC
Start: 2014-09-12 — End: 2014-09-12
  Administered 2014-09-12: 1 via TOPICAL

## 2014-09-12 MED ORDER — DEXTROSE 50 % IV SOLN
INTRAVENOUS | Status: DC | PRN
  Administered 2014-09-12 (×2): 12.5 g via INTRAVENOUS

## 2014-09-12 MED ORDER — PROPOFOL 10 MG/ML IV BOLUS
INTRAVENOUS | Status: DC | PRN
  Administered 2014-09-12: 110 mg via INTRAVENOUS

## 2014-09-12 MED ORDER — MIDAZOLAM HCL 2 MG/2ML IJ SOLN
INTRAMUSCULAR | Status: AC
  Filled 2014-09-12: qty 2

## 2014-09-12 MED ORDER — GUAIFENESIN-DM 100-10 MG/5ML PO SYRP: 15.0000 mL | ORAL_SOLUTION | ORAL | Status: DC | PRN

## 2014-09-12 MED ORDER — ROCURONIUM BROMIDE 100 MG/10ML IV SOLN
INTRAVENOUS | Status: DC | PRN
  Administered 2014-09-12: 50 mg via INTRAVENOUS

## 2014-09-12 MED ORDER — MORPHINE SULFATE 2 MG/ML IJ SOLN
2.0000 mg | INTRAMUSCULAR | Status: DC | PRN
  Administered 2014-09-12 (×2): 2 mg via INTRAVENOUS
  Administered 2014-09-13 (×3): 4 mg via INTRAVENOUS
  Administered 2014-09-13: 2 mg via INTRAVENOUS
  Administered 2014-09-13: 4 mg via INTRAVENOUS
  Administered 2014-09-13 – 2014-09-15 (×5): 2 mg via INTRAVENOUS
  Administered 2014-09-15 – 2014-09-19 (×3): 4 mg via INTRAVENOUS
  Administered 2014-09-20: 2 mg via INTRAVENOUS
  Filled 2014-09-12 (×3): qty 1
  Filled 2014-09-12 (×2): qty 2
  Filled 2014-09-12: qty 1
  Filled 2014-09-12: qty 2
  Filled 2014-09-12: qty 1
  Filled 2014-09-12 (×3): qty 2
  Filled 2014-09-12 (×6): qty 1

## 2014-09-12 MED ORDER — MIDAZOLAM HCL 5 MG/5ML IJ SOLN
INTRAMUSCULAR | Status: DC | PRN
  Administered 2014-09-12: 2 mg via INTRAVENOUS

## 2014-09-12 MED ORDER — HYDROMORPHONE HCL 1 MG/ML IJ SOLN
0.2500 mg | INTRAMUSCULAR | Status: DC | PRN
  Administered 2014-09-12 (×2): 0.5 mg via INTRAVENOUS

## 2014-09-12 MED ORDER — DEXAMETHASONE SODIUM PHOSPHATE 4 MG/ML IJ SOLN
INTRAMUSCULAR | Status: AC
Start: 2014-09-12 — End: 2014-09-12
  Filled 2014-09-12: qty 1

## 2014-09-12 MED ORDER — ARTIFICIAL TEARS OP OINT
TOPICAL_OINTMENT | OPHTHALMIC | Status: DC | PRN
  Administered 2014-09-12: 1 via OPHTHALMIC

## 2014-09-12 MED ORDER — GLYCOPYRROLATE 0.2 MG/ML IJ SOLN
INTRAMUSCULAR | Status: DC | PRN
  Administered 2014-09-12: 0.4 mg via INTRAVENOUS

## 2014-09-12 MED ORDER — ONDANSETRON HCL 4 MG/2ML IJ SOLN
INTRAMUSCULAR | Status: DC | PRN
  Administered 2014-09-12: 4 mg via INTRAVENOUS

## 2014-09-12 MED ORDER — NEOSTIGMINE METHYLSULFATE 10 MG/10ML IV SOLN
INTRAVENOUS | Status: DC | PRN
  Administered 2014-09-12: 3 mg via INTRAVENOUS

## 2014-09-12 MED ORDER — MEPERIDINE HCL 25 MG/ML IJ SOLN: 6.2500 mg | INTRAMUSCULAR | Status: DC | PRN

## 2014-09-12 MED ORDER — MELATONIN 10 MG PO TABS: 10.0000 mg | ORAL_TABLET | Freq: Every day | ORAL | Status: DC

## 2014-09-12 MED ORDER — METOPROLOL SUCCINATE ER 100 MG PO TB24
100.0000 mg | ORAL_TABLET | Freq: Every day | ORAL | Status: DC
  Administered 2014-09-13 – 2014-09-20 (×8): 100 mg via ORAL
  Filled 2014-09-12 (×11): qty 1

## 2014-09-12 MED ORDER — LABETALOL HCL 5 MG/ML IV SOLN
10.0000 mg | INTRAVENOUS | Status: DC | PRN
  Filled 2014-09-12: qty 4

## 2014-09-12 MED ORDER — ASPIRIN 81 MG PO CHEW
81.0000 mg | CHEWABLE_TABLET | Freq: Every day | ORAL | Status: DC
  Administered 2014-09-13 – 2014-09-19 (×7): 81 mg via ORAL
  Filled 2014-09-12 (×8): qty 1

## 2014-09-12 MED ORDER — ROCURONIUM BROMIDE 50 MG/5ML IV SOLN
INTRAVENOUS | Status: AC
  Filled 2014-09-12: qty 1

## 2014-09-12 MED ORDER — LIDOCAINE HCL (CARDIAC) 20 MG/ML IV SOLN
INTRAVENOUS | Status: AC
  Filled 2014-09-12: qty 5

## 2014-09-12 MED ORDER — ACETAMINOPHEN 650 MG RE SUPP: 325.0000 mg | RECTAL | Status: DC | PRN

## 2014-09-12 MED ORDER — POTASSIUM CHLORIDE CRYS ER 20 MEQ PO TBCR
20.0000 meq | EXTENDED_RELEASE_TABLET | Freq: Every day | ORAL | Status: DC
  Administered 2014-09-12 – 2014-09-20 (×9): 20 meq via ORAL
  Filled 2014-09-12 (×10): qty 1

## 2014-09-12 MED ORDER — NEOSTIGMINE METHYLSULFATE 10 MG/10ML IV SOLN
INTRAVENOUS | Status: AC
  Filled 2014-09-12: qty 1

## 2014-09-12 SURGICAL SUPPLY — 69 items
APL SKNCLS STERI-STRIP NONHPOA (GAUZE/BANDAGES/DRESSINGS) ×2
BANDAGE ESMARK 6X9 LF (GAUZE/BANDAGES/DRESSINGS) IMPLANT
BENZOIN TINCTURE PRP APPL 2/3 (GAUZE/BANDAGES/DRESSINGS) ×5 IMPLANT
BNDG CMPR 9X6 STRL LF SNTH (GAUZE/BANDAGES/DRESSINGS)
BNDG ESMARK 6X9 LF (GAUZE/BANDAGES/DRESSINGS)
BNDG GAUZE ELAST 4 BULKY (GAUZE/BANDAGES/DRESSINGS) ×2 IMPLANT
CANISTER SUCTION 2500CC (MISCELLANEOUS) ×3 IMPLANT
CANNULA VESSEL 3MM 2 BLNT TIP (CANNULA) ×6 IMPLANT
CATH EMB 4FR 80CM (CATHETERS) ×2 IMPLANT
CLIP LIGATING EXTRA MED SLVR (CLIP) ×3 IMPLANT
CLIP LIGATING EXTRA SM BLUE (MISCELLANEOUS) ×3 IMPLANT
CLOSURE STERI-STRIP 1/2X4 (GAUZE/BANDAGES/DRESSINGS) ×2
CLOSURE WOUND 1/2 X4 (GAUZE/BANDAGES/DRESSINGS) ×2
CLSR STERI-STRIP ANTIMIC 1/2X4 (GAUZE/BANDAGES/DRESSINGS) ×2 IMPLANT
CUFF TOURNIQUET SINGLE 34IN LL (TOURNIQUET CUFF) IMPLANT
CUFF TOURNIQUET SINGLE 44IN (TOURNIQUET CUFF) IMPLANT
DRAIN SNY 10X20 3/4 PERF (WOUND CARE) IMPLANT
DRAPE X-RAY CASS 24X20 (DRAPES) IMPLANT
DRSG COVADERM 4X10 (GAUZE/BANDAGES/DRESSINGS) IMPLANT
DRSG COVADERM 4X6 (GAUZE/BANDAGES/DRESSINGS) ×2 IMPLANT
DRSG COVADERM 4X8 (GAUZE/BANDAGES/DRESSINGS) ×2 IMPLANT
ELECT REM PT RETURN 9FT ADLT (ELECTROSURGICAL) ×3
ELECTRODE REM PT RTRN 9FT ADLT (ELECTROSURGICAL) ×1 IMPLANT
EVACUATOR SILICONE 100CC (DRAIN) IMPLANT
GAUZE SPONGE 4X4 12PLY STRL (GAUZE/BANDAGES/DRESSINGS) ×5 IMPLANT
GLOVE BIO SURGEON STRL SZ 6 (GLOVE) ×4 IMPLANT
GLOVE BIO SURGEON STRL SZ7.5 (GLOVE) ×2 IMPLANT
GLOVE BIOGEL PI IND STRL 6.5 (GLOVE) IMPLANT
GLOVE BIOGEL PI IND STRL 7.0 (GLOVE) IMPLANT
GLOVE BIOGEL PI IND STRL 8 (GLOVE) IMPLANT
GLOVE BIOGEL PI INDICATOR 6.5 (GLOVE) ×2
GLOVE BIOGEL PI INDICATOR 7.0 (GLOVE) ×2
GLOVE BIOGEL PI INDICATOR 8 (GLOVE) ×2
GLOVE ECLIPSE 6.5 STRL STRAW (GLOVE) ×4 IMPLANT
GLOVE SS BIOGEL STRL SZ 7.5 (GLOVE) ×1 IMPLANT
GLOVE SUPERSENSE BIOGEL SZ 7.5 (GLOVE) ×2
GLOVE SURG SS PI 7.0 STRL IVOR (GLOVE) ×4 IMPLANT
GOWN STRL REUS W/ TWL LRG LVL3 (GOWN DISPOSABLE) ×3 IMPLANT
GOWN STRL REUS W/ TWL XL LVL3 (GOWN DISPOSABLE) IMPLANT
GOWN STRL REUS W/TWL LRG LVL3 (GOWN DISPOSABLE) ×18
GOWN STRL REUS W/TWL XL LVL3 (GOWN DISPOSABLE) ×6
INSERT FOGARTY SM (MISCELLANEOUS) IMPLANT
KIT BASIN OR (CUSTOM PROCEDURE TRAY) ×3 IMPLANT
KIT ROOM TURNOVER OR (KITS) ×3 IMPLANT
LIQUID BAND (GAUZE/BANDAGES/DRESSINGS) ×2 IMPLANT
NS IRRIG 1000ML POUR BTL (IV SOLUTION) ×6 IMPLANT
PACK PERIPHERAL VASCULAR (CUSTOM PROCEDURE TRAY) ×3 IMPLANT
PAD ARMBOARD 7.5X6 YLW CONV (MISCELLANEOUS) ×6 IMPLANT
PADDING CAST COTTON 6X4 STRL (CAST SUPPLIES) IMPLANT
PROBE PENCIL 8 MHZ STRL DISP (MISCELLANEOUS) ×2 IMPLANT
SET COLLECT BLD 21X3/4 12 (NEEDLE) IMPLANT
STAPLER VISISTAT 35W (STAPLE) IMPLANT
STOPCOCK 4 WAY LG BORE MALE ST (IV SETS) ×2 IMPLANT
STRIP CLOSURE SKIN 1/2X4 (GAUZE/BANDAGES/DRESSINGS) ×3 IMPLANT
SUT ETHILON 3 0 PS 1 (SUTURE) IMPLANT
SUT PROLENE 5 0 C 1 24 (SUTURE) ×7 IMPLANT
SUT PROLENE 6 0 CC (SUTURE) ×9 IMPLANT
SUT SILK 2 0 SH (SUTURE) ×3 IMPLANT
SUT SILK 3 0 (SUTURE) ×3
SUT SILK 3-0 18XBRD TIE 12 (SUTURE) IMPLANT
SUT VIC AB 2-0 CTX 36 (SUTURE) ×8 IMPLANT
SUT VIC AB 3-0 SH 27 (SUTURE) ×9
SUT VIC AB 3-0 SH 27X BRD (SUTURE) ×2 IMPLANT
SUT VICRYL 4-0 PS2 18IN ABS (SUTURE) ×2 IMPLANT
SYR 3ML LL SCALE MARK (SYRINGE) ×2 IMPLANT
TRAY FOLEY CATH 16FRSI W/METER (SET/KITS/TRAYS/PACK) ×3 IMPLANT
TUBING EXTENTION W/L.L. (IV SETS) IMPLANT
UNDERPAD 30X30 INCONTINENT (UNDERPADS AND DIAPERS) ×3 IMPLANT
WATER STERILE IRR 1000ML POUR (IV SOLUTION) ×3 IMPLANT

## 2014-09-12 NOTE — Anesthesia Preprocedure Evaluation (Addendum)
Anesthesia Evaluation  Patient identified by MRN, date of birth, ID band Patient awake    Reviewed: Allergy & Precautions, NPO status , Patient's Chart, lab work & pertinent test results  Airway Mallampati: I  TM Distance: >3 FB Neck ROM: Full    Dental  (+) Teeth Intact, Dental Advisory Given   Pulmonary former smoker,          Cardiovascular hypertension, Pt. on medications + CAD and + Past MI     Neuro/Psych    GI/Hepatic GERD-  Medicated and Controlled,  Endo/Other  diabetes, Type 2, Oral Hypoglycemic Agents  Renal/GU CRFRenal disease     Musculoskeletal   Abdominal   Peds  Hematology   Anesthesia Other Findings   Reproductive/Obstetrics                            Anesthesia Physical Anesthesia Plan  ASA: III  Anesthesia Plan: General   Post-op Pain Management:    Induction: Intravenous  Airway Management Planned: LMA  Additional Equipment:   Intra-op Plan:   Post-operative Plan: Extubation in OR  Informed Consent: I have reviewed the patients History and Physical, chart, labs and discussed the procedure including the risks, benefits and alternatives for the proposed anesthesia with the patient or authorized representative who has indicated his/her understanding and acceptance.     Plan Discussed with: CRNA and Surgeon  Anesthesia Plan Comments:         Anesthesia Quick Evaluation

## 2014-09-12 NOTE — Anesthesia Postprocedure Evaluation (Signed)
Anesthesia Post Note  Patient: [name]  Procedure(s) Performed: Procedure(s) (LRB):  LEFT FEMORAL-POPLITEAL ARTERY BYPASS GRAFT USING NON REVERSE LEFT GREATER SAPHENOUS VEIN (Left)  Anesthesia type: general  Patient location: PACU  Post pain: Pain level controlled  Post assessment: Patient's Cardiovascular Status Stable  Last Vitals:  Filed Vitals:   09/12/14 1730  BP: 148/48  Pulse: 88  Temp: 36.6 C  Resp: 11    Post vital signs: Reviewed and stable  Level of consciousness: sedated  Complications: No apparent anesthesia complications

## 2014-09-12 NOTE — Interval H&P Note (Signed)
History and Physical Interval Note:  09/12/2014 11:15 AM  [name]  has presented today for surgery, with the diagnosis of Left foot gangrene I96  The various methods of treatment have been discussed with the patient and family. After consideration of risks, benefits and other options for treatment, the patient has consented to  Procedure(s): BYPASS GRAFT FEMORAL-POPLITEAL VERSUS PERONEAL ARTERY (Left) as a surgical intervention .  The patient's history has been reviewed, patient examined, no change in status, stable for surgery.  I have reviewed the patient's chart and labs.  Questions were answered to the patient's satisfaction.     Raneshia Derick

## 2014-09-12 NOTE — Interval H&P Note (Signed)
History and Physical Interval Note:  09/12/2014 11:04 AM  [name]  has presented today for surgery, with the diagnosis of Left foot gangrene I96  The various methods of treatment have been discussed with the patient and family. After consideration of risks, benefits and other options for treatment, the patient has consented to  Procedure(s): BYPASS GRAFT FEMORAL-POPLITEAL VERSUS PERONEAL ARTERY (Left) as a surgical intervention .  The patient's history has been reviewed, patient examined, no change in status, stable for surgery.  I have reviewed the patient's chart and labs.  Questions were answered to the patient's satisfaction.     [name]

## 2014-09-12 NOTE — Transfer of Care (Signed)
Immediate Anesthesia Transfer of Care Note  Patient: [name]  Procedure(s) Performed: Procedure(s):  LEFT FEMORAL-POPLITEAL ARTERY BYPASS GRAFT USING NON REVERSE LEFT GREATER SAPHENOUS VEIN (Left)  Patient Location: PACU  Anesthesia Type:General  Level of Consciousness: awake, alert , oriented and patient cooperative  Airway & Oxygen Therapy: Patient Spontanous Breathing and Patient connected to nasal cannula oxygen  Post-op Assessment: Report given to RN and Post -op Vital signs reviewed and stable  Post vital signs: Reviewed and stable  Last Vitals:  Filed Vitals:   09/12/14 0941  BP: 166/75  Pulse: 111  Temp: 37.4 C  Resp: 18    Complications: No apparent anesthesia complications

## 2014-09-12 NOTE — Consult Note (Signed)
  Dr. Donnetta Hutching asked if I could come by and see [name] since she is well-known to me.  She has a previous right BKA that I did in 2008.  She has PVD and a chronic wound involving mainly her left foot first ray.  She did undergo bypass grafting of her left leg by Dr. Donnetta Hutching this afternoon.  She told me that she developed the left foot wound a while ago and has been receiving treatment at the [place].  On exam, her left great toe metatarsal has an open wound medially with exposed bone, changes consistent with gout, and well as gross purulence consistent with osteo.  She has a wound on the lateral aspect of her foot that is trying to heal.  I think at a minimum, she needs a left foot first ray resection.  I will plan to take her to the OR late Wednesday this week.  She has significant neuropathy, so surgery may be able to be performed using light anesthesia and an ankle block only.  She understands this fully and agrees.  I'll see her again tomorrow.

## 2014-09-12 NOTE — Anesthesia Procedure Notes (Signed)
Procedure Name: Intubation Date/Time: 09/12/2014 11:52 AM Performed by: Suzy Bouchard Pre-anesthesia Checklist: Patient identified, Timeout performed, Emergency Drugs available, Suction available and Patient being monitored Patient Re-evaluated:Patient Re-evaluated prior to inductionOxygen Delivery Method: Circle system utilized Preoxygenation: Pre-oxygenation with 100% oxygen Intubation Type: IV induction Ventilation: Mask ventilation without difficulty Laryngoscope Size: Miller and 2 Grade View: Grade I Tube type: Oral Tube size: 7.5 mm Number of attempts: 1 Airway Equipment and Method: Stylet Placement Confirmation: ETT inserted through vocal cords under direct vision,  breath sounds checked- equal and bilateral and positive ETCO2 Secured at: 22 cm Tube secured with: Tape Dental Injury: Teeth and Oropharynx as per pre-operative assessment

## 2014-09-12 NOTE — Op Note (Signed)
OPERATIVE REPORT  DATE OF SURGERY: 09/12/2014  PATIENT: [name] STOERMER, 74 y.o. female MRN: [id number]  DOB: 1940/11/10  PRE-OPERATIVE DIAGNOSIS: Left lower extremity ischemia with nonhealing wound first metatarsal head  POST-OPERATIVE DIAGNOSIS:  Same  PROCEDURE: Left femoral to anterior tibial bypass with translocated non-reversed great saphenous vein  SURGEON:  Curt Jews, M.D.  PHYSICIAN ASSISTANT: Samantha [name] PA-C  ANESTHESIA:  Gen.  EBL: 200 ml  Total I/O In: 1000 [I.V.:1000] Out: 285 [Urine:285]  BLOOD ADMINISTERED: None  DRAINS: None  SPECIMEN: None  COUNTS CORRECT:  YES  PLAN OF CARE: PACU   PATIENT DISPOSITION:  PACU - hemodynamically stable  PROCEDURE DETAILS: The patient was taken to the operating placed supine position where the area of the left groin left leg were prepped and draped in sterile fashion. Patient had a large pannus and this was taped superiorly to give access to the left common femoral artery. SonoSite ultrasound was used to visualize the saphenous vein which was of good caliber from mid calf to the mid thigh. This area was marked. An incision was made over the saphenous vein and the proximal calf. Patient had extensive subcutaneous edema. The saphenous vein was of good caliber. Tributary branches were ligated with 3 or 4-0 silk ties and divided. The fascia was opened through the same incision and the popliteal vein was identified. The patient had a variant anatomy as was seen on preoperative arteriogram. Anterior tibial was a dominant runoff vessel to the foot and this had a high takeoff behind the knee and tract medially and then laterally through the interosseous membrane. This vessel was identified. It was a large vessel with significant calcification. There was no pulse on this vessel. The vein was harvested leaving several skin bridges. The vein was exposed and the above-knee position on the medial thigh and remained good caliber. The  superficial femoral artery was isolated through the same incision at the adductor canal. This was extremely calcified with minimal pulse and was not adequate for inflow. For this reason a separate incision was made over the vein up to the level of the groin. A oblique incision was made at this level did not go into the panniculus. Superior retraction was used to expose the common superficial femoral and profundus femoris arteries. The patient had extensive calcification in the common femoral artery extending down to the origin of the superficial femoral artery. The vein was identified at the saphenofemoral junction. Tributary branches of the saphenofemoral junction were ligated and divided. The vein was occluded with a Cooley clamp at the saphenofemoral junction and was transected. The saphenofemoral junction was oversewn with a small cuff of saphenous vein using 2 layers of 5-0 Prolene suture. The saphenous vein was harvested down to the mid calf. The vein was cannulated with a vessel cannula was gently dilated with heparinized saline. I was of excellent caliber throughout its course. A tunnel was created from the below-knee popliteal level to the groin. The patient was given 9000 units intravenous heparin. After adequate circulation time the common femoral artery was occluded with a Henley clamp and the superficial femoral function was arteries were occluded with Vesseloops. There was extensive calcification in the common femoral artery. The artery was opened with 11 blade some longitudinally with Potts scissors. The calcification was such that anastomosis was not possible here and therefore the common femoral artery was endarterectomized down onto the superficial femoral artery. Several tacking sutures were used at the orifice of the superficial femoral artery  to prevent dissection. There was a good feathering endpoint on the deep femoral artery. The vein was brought onto the field was non-reversed. The vein was  opened longitudinally and was sewn end-to-side to the junction of the common and superficial femoral artery with a running 5-0 Prolene suture. After completion of the anastomosis clamps removed. Several additional sutures were required for hemostasis. Next the [employer] was brought onto the field and was used to lyse the vein valves giving excellent flow through the vein. The vein was brought to the prior created tunnel. The anterior tibial artery was occluded proximally and distally and was opened with 11 blade some ulcerative with Potts scissors. It was extensively calcified but did have a good caliber. The vein was cut to appropriate length was spatulated and sewn end-to-side to the artery with a running 6-0 Prolene suture. Prior to completion of the anastomosis the usual flushing maneuvers were undertaken. Anastomosis completed and clamps were removed. Patient had excellent graft dependent Doppler flow in the anterior tibial towards the ankle. The patient was given 50 mg of protamine to reverse the heparin. Wounds irrigated with saline. Hemostasis tablet cautery. Wounds were closed with 2-0 Vicryl in the fascia sent all the incisions. The Hasson was closed with 301 4-0 subcuticular Vicryl sutures. Sterile dressing was applied the patient was taken to the recovery room in stable condition   Curt Jews, M.D. 09/12/2014 3:59 PM

## 2014-09-12 NOTE — H&P (View-Only) (Signed)
Patient name: [name] MRN: [id number] DOB: 10-16-1940 Sex: female   Referred by: Drema Dallas  Reason for referral:  Chief Complaint  Patient presents with  . New Evaluation    vascular consult    HISTORY OF PRESENT ILLNESS: The patient is seen today for evaluation of gangrenous changes of her left foot. She is very pleasant 74 year old with a very complex past medical history. She did have nonhealing ulcerations of her right foot in 2008 and had a partial foot amputation and subsequently a below-knee amputation by Dr. Ninfa Linden. She has had issues with wound problems in her left foot since the last 6-8 months. She did have healing of this and then has had recurrences. She does have neuropathy with not much pain associated with her left foot. She does walk with a right leg prosthesis. The mortise changes of her left great toe been progressive and she is developing changes in her right foot as well. She did undergo an MRI yesterday which I have reviewed as well. Her past history is significant for bladder cancer. She had a resection of this with an extensive surgery at [facility] and had a myocardial infarction following this. She also has a history of renal insufficiency but has not been on hemodialysis and does have stage III chronic renal insufficiency  Past Medical History  Diagnosis Date  . Hypertension   . Hyperlipidemia   . History of atrial tachycardia CONTROLLED W/ LOPRESSOR  . Peripheral vascular disease S/P RIGHT BKA  . Headache(784.0)   . Peripheral neuropathy LEGS AND HANDS  . Arthritis   . S/P BKA (below knee amputation) unilateral RIGHT  . GERD (gastroesophageal reflux disease)   . Anemia   . Blood transfusion   . Diabetes mellitus ORAL AND INSULIN MEDS  . Macular degeneration of both eyes   . Glaucoma   . Retinopathy due to secondary diabetes mellitus   . Insomnia   . Constipation   . History of bladder cancer TCC AND CIS  . MI (mitral  incompetence)   . Coronary artery disease   . Chronic kidney disease   . Cancer     bladder    Past Surgical History  Procedure Laterality Date  . I & d right below knee amputation wound  06-10-2011  . Cysto / resection bladder bx's  04-01-11  &  11-26-10  . Below knee leg amputation  05-02-2007    RIGHT  . Right foot i & d / removal necrotic bone  JUN  &  AUG 2008  . Right great toe amputation  11-05-2006  . Laparoscopic cholecystectomy  12-10-1999  . Back surgery  [facility]  . Appendectomy  1963  . Cataract extraction w/ intraocular lens  implant, bilateral    . Cystoscopy with biopsy  10/14/2011    Procedure: CYSTOSCOPY WITH BIOPSY;  Surgeon: Claybon Jabs, MD;  Location: [facility];  Service: Urology;  Laterality: N/A;  BLADDER BIOPSY  . Coronary stent placement    . Abdominal hysterectomy    . Revision urostomy cutaneous    . Revision urostomy cutaneous      History   Social History  . Marital Status: Legally Separated    Spouse Name: N/A    Number of Children: N/A  . Years of Education: N/A   Occupational History  . Not on file.   Social History Main Topics  . Smoking status: Former Smoker -- 2.00  packs/day for 17 years    Types: Cigarettes    Quit date: 10/09/1975  . Smokeless tobacco: Never Used  . Alcohol Use: 0.0 oz/week    0 Not specified per week     Comment: RARE  . Drug Use: No  . Sexual Activity: Not on file   Other Topics Concern  . Not on file   Social History Narrative    Family History  Problem Relation Age of Onset  . Heart failure Mother     died age 38  . Sudden death Father 58  . Heart disease Father   . Hypertension Father   . Heart attack Father   . Diabetes Brother   . Heart attack Brother   . AAA (abdominal aortic aneurysm) Brother     Allergies as of 09/06/2014 - Review Complete 09/06/2014  Allergen Reaction Noted  . Clarithromycin Itching 10/09/2011  . Lisinopril Other (See Comments) 11/14/2012  .  Metformin and related Other (See Comments) 11/14/2012    Current Outpatient Prescriptions on File Prior to Visit  Medication Sig Dispense Refill  . acetaminophen (TYLENOL) 500 MG tablet Take 1,000 mg by mouth as needed for pain.    Marland Kitchen aspirin 81 MG chewable tablet Chew 81 mg by mouth at bedtime.     Marland Kitchen atorvastatin (LIPITOR) 80 MG tablet Take 1 tablet (80 mg total) by mouth every evening. 90 tablet 2  . clopidogrel (PLAVIX) 75 MG tablet TAKE ONE TABLET BY MOUTH DAILY 30 tablet 0  . furosemide (LASIX) 40 MG tablet Take 80 mg by mouth 2 (two) times daily.     . insulin glargine (LANTUS) 100 UNIT/ML injection Inject 55-60 Units into the skin 2 (two) times daily. 55 units in the morning and 60 units at night    . insulin lispro (HUMALOG) 100 UNIT/ML injection Inject 20-50 Units into the skin daily as needed for high blood sugar. PER SLIDING SCALE    . isosorbide mononitrate (IMDUR) 30 MG 24 hr tablet Take 1 tablet (30 mg total) by mouth daily. 30 tablet 11  . Melatonin 10 MG TABS Take 10 mg by mouth at bedtime.     . metoprolol succinate (TOPROL-XL) 100 MG 24 hr tablet TAKE 1 TABLET (100 MG TOTAL) BY MOUTH EVERY EVENING. TAKE WITH OR IMMEDIATELY FOLLOWING A MEAL. 90 tablet 1  . Multiple Vitamins-Minerals (PRESERVISION AREDS PO) Take 1 capsule by mouth 2 (two) times daily.    . nitroGLYCERIN (NITROSTAT) 0.4 MG SL tablet Place 0.4 mg under the tongue every 5 (five) minutes as needed for chest pain.    Marland Kitchen omeprazole (PRILOSEC) 20 MG capsule Take 20 mg by mouth daily as needed (gurd).     . polyethylene glycol (MIRALAX / GLYCOLAX) packet Take 17 g by mouth daily. 14 each 0  . potassium chloride SA (K-DUR,KLOR-CON) 20 MEQ tablet Take 20 mEq by mouth daily.     . Psyllium (VEGETABLE LAXATIVE PO) Take 4 tablets by mouth at bedtime.    . SODIUM BICARBONATE PO Take 20 g by mouth 2 (two) times daily.     No current facility-administered medications on file prior to visit.     REVIEW OF  SYSTEMS:  Positives indicated with an "X"  CARDIOVASCULAR:  [ ]  chest pain   [ ]  chest pressure   [ ]  palpitations   [ ]  orthopnea   [ ]  dyspnea on exertion   [ ]  claudication   [ ]  rest pain   [ ]  DVT   [ ]   phlebitis PULMONARY:   [ ]  productive cough   [ ]  asthma   [ ]  wheezing NEUROLOGIC:   [ ]  weakness  [x ] paresthesias  [ ]  aphasia  [ ]  amaurosis  [ x] dizziness HEMATOLOGIC:   [ ]  bleeding problems   [ ]  clotting disorders MUSCULOSKELETAL:  [ ]  joint pain   [ ]  joint swelling GASTROINTESTINAL: [ ]   blood in stool  [ ]   hematemesis GENITOURINARY:  [ ]   dysuria  [ ]   hematuria PSYCHIATRIC:  [ ]  history of major depression INTEGUMENTARY:  [ ]  rashes  [x]  ulcers CONSTITUTIONAL:  [ ]  fever   [ ]  chills  PHYSICAL EXAMINATION:  General: The patient is a well-nourished female, in no acute distress. Vital signs are BP 134/49 mmHg  Pulse 101  Temp(Src) 99 F (37.2 C) (Oral)  Ht 5\' 4"  (1.626 m)  Wt 211 lb (95.709 kg)  BMI 36.20 kg/m2  SpO2 99% Pulmonary: There is a good air exchange   Musculoskeletal: Prosthesis in place on right below-knee amputation. Neurologic: No focal weakness or paresthesias are detected, Skin: Extensive ulceration over the medial first metatarsal head with the apparent bone involvement. Erythema superficially for ulceration over the fifth metatarsal head laterally. Erythema over her mid calf circumferentially with some superficial abrasions. Psychiatric: The patient has normal affect. Cardiovascular: Palpable radial pulses bilaterally. Absent left popliteal and distal pulses   [facility] Vascular Lab Studies:  Ordered and Independently Reviewed diminished flow in left foot with poor visualization related to edema. Does have biphasic flow at the level of the popliteal with a patent superficial femoral artery by duplex.  Impression and Plan:  Very difficult problem. I had a very long discussion with the patient and her friend present. I explained that she could  potentially have a viable partial foot amputation if we could improve blood flow to her foot. I did explain that she may have a non-reconstructable situation. Explain the only option would be for arteriogram to see if she has a lesion amenable to either endovascular stenting or bypass. Her creatinine is 1.6. I did explain the potential risk for renal dysfunction with arteriography. I would proceed with CO2 angiogram to determine if she has treatable lesion. I feel that there is certainly a high likelihood that there is nothing reconstructable. She does wish to proceed with arteriogram for evaluation of any possible chance for healing a partial foot amputation since she is ambulatory with her prosthesis. We will also involve Dr. Ninfa Linden once we have more information regarding her revascularization possibility.    [name] [facility] [place] Office: [phone]

## 2014-09-13 ENCOUNTER — Encounter (HOSPITAL_COMMUNITY): Payer: Self-pay | Admitting: Vascular Surgery

## 2014-09-13 LAB — CBC
HEMATOCRIT: 29.2 % — AB (ref 36.0–46.0)
HEMOGLOBIN: 9.3 g/dL — AB (ref 12.0–15.0)
MCH: 26.5 pg (ref 26.0–34.0)
MCHC: 31.8 g/dL (ref 30.0–36.0)
MCV: 83.2 fL (ref 78.0–100.0)
PLATELETS: 289 10*3/uL (ref 150–400)
RBC: 3.51 MIL/uL — ABNORMAL LOW (ref 3.87–5.11)
RDW: 13.9 % (ref 11.5–15.5)
WBC: 8.6 10*3/uL (ref 4.0–10.5)

## 2014-09-13 LAB — BASIC METABOLIC PANEL
Anion gap: 6 (ref 5–15)
BUN: 39 mg/dL — ABNORMAL HIGH (ref 6–23)
CHLORIDE: 100 mmol/L (ref 96–112)
CO2: 27 mmol/L (ref 19–32)
Calcium: 9.3 mg/dL (ref 8.4–10.5)
Creatinine, Ser: 1.52 mg/dL — ABNORMAL HIGH (ref 0.50–1.10)
GFR calc Af Amer: 38 mL/min — ABNORMAL LOW (ref >90)
GFR calc non Af Amer: 33 mL/min — ABNORMAL LOW (ref >90)
GLUCOSE: 171 mg/dL — AB (ref 70–99)
POTASSIUM: 4.9 mmol/L (ref 3.5–5.1)
SODIUM: 133 mmol/L — AB (ref 135–145)

## 2014-09-13 LAB — GLUCOSE, CAPILLARY
GLUCOSE-CAPILLARY: 166 mg/dL — AB (ref 70–99)
GLUCOSE-CAPILLARY: 284 mg/dL — AB (ref 70–99)
Glucose-Capillary: 194 mg/dL — ABNORMAL HIGH (ref 70–99)

## 2014-09-13 MED ORDER — INFLUENZA VAC SPLIT QUAD 0.5 ML IM SUSY
0.5000 mL | PREFILLED_SYRINGE | INTRAMUSCULAR | Status: AC
  Administered 2014-09-20: 0.5 mL via INTRAMUSCULAR
  Filled 2014-09-13 (×2): qty 0.5

## 2014-09-13 NOTE — Evaluation (Signed)
Physical Therapy Evaluation Patient Details Name: [name] MRN: [id number] DOB: Feb 13, 1941 Today's Date: 09/13/2014   History of Present Illness  pt presents post L Femoral to Anterior Tibial Bypass with plan for L first ray amputation on 09/14/14.    Clinical Impression  Pt very eager to get to sitting EOB and change her position.  Pt assists with mobility, though very limited by pain in L LE.  Pt is planned for amputation of L first ray on 09/14/14.  Will continue to follow after next surgery and need new orders post-op.      Follow Up Recommendations CIR    Equipment Recommendations  None recommended by PT    Recommendations for Other Services Rehab consult     Precautions / Restrictions Precautions Precautions: Fall Precaution Comments: pt with R BKA and prosthetic LE.   Restrictions Weight Bearing Restrictions: No      Mobility  Bed Mobility Overal bed mobility: Needs Assistance;+2 for physical assistance Bed Mobility: Supine to Sit     Supine to sit: Mod assist;+2 for physical assistance;HOB elevated     General bed mobility comments: pt needs A with L LE and bringing trunk up to sitting.  pt utilizes bed rail to A with trying to pull self up to sitting position.    Transfers                    Ambulation/Gait                Stairs            Wheelchair Mobility    Modified Rankin (Stroke Patients Only)       Balance Overall balance assessment: Needs assistance Sitting-balance support: No upper extremity supported;Feet supported Sitting balance-Leahy Scale: Good                                       Pertinent Vitals/Pain Pain Assessment: 0-10 Pain Score: 7  Pain Location: L LE Pain Descriptors / Indicators: Aching Pain Intervention(s): Monitored during session;Premedicated before session;Repositioned    Home Living Family/patient expects to be discharged to:: Inpatient rehab                       Prior Function Level of Independence: Independent with assistive device(s)               Hand Dominance        Extremity/Trunk Assessment   Upper Extremity Assessment: Defer to OT evaluation           Lower Extremity Assessment: RLE deficits/detail;LLE deficits/detail RLE Deficits / Details: R BKA in 2008 and demos good functional movement.   LLE Deficits / Details: New bypass and wound on L foot with plan for first ray amputation tomorrow.    Cervical / Trunk Assessment: Normal  Communication   Communication: No difficulties  Cognition Arousal/Alertness: Awake/alert Behavior During Therapy: WFL for tasks assessed/performed Overall Cognitive Status: Within Functional Limits for tasks assessed                      General Comments      Exercises        Assessment/Plan    PT Assessment Patient needs continued PT services  PT Diagnosis Difficulty walking;Generalized weakness   PT Problem List Decreased strength;Decreased activity tolerance;Decreased range of motion;Decreased balance;Decreased mobility;Decreased coordination;Decreased knowledge  of use of DME;Pain;Obesity  PT Treatment Interventions DME instruction;Gait training;Functional mobility training;Therapeutic activities;Therapeutic exercise;Balance training;Patient/family education   PT Goals (Current goals can be found in the Care Plan section) Acute Rehab PT Goals Patient Stated Goal: Get home with her dog.   PT Goal Formulation: With patient Time For Goal Achievement: 09/27/14 Potential to Achieve Goals: Good    Frequency Min 3X/week   Barriers to discharge Decreased caregiver support      Co-evaluation               End of Session Equipment Utilized During Treatment: Oxygen Activity Tolerance: Patient tolerated treatment well Patient left: in bed;with call bell/phone within reach (Sitting EOB) Nurse Communication: Mobility status         Time: 6979-4801 PT Time  Calculation (min) (ACUTE ONLY): 31 min   Charges:   PT Evaluation $Initial PT Evaluation Tier I: 1 Procedure PT Treatments $Therapeutic Activity: 8-22 mins   PT G CodesCatarina Hartshorn, [place] 09/13/2014, 3:34 PM

## 2014-09-13 NOTE — Progress Notes (Signed)
Patient ID: [name], female   DOB: [birth year], 74 y.o.   MRN: [id number] [name] has been recovering well from her surgery yesterday.  She understands fully that we will proceed to the OR late tomorrow afternoon for a left foot 1st ray amputation secondary to her chronic wound, necrosis, and infection.

## 2014-09-13 NOTE — Progress Notes (Signed)
Utilization review completed.  

## 2014-09-13 NOTE — Progress Notes (Signed)
Pt HR ST 120-130's MD aware. PO Metoprolol given. Will continue to monitor.

## 2014-09-13 NOTE — Progress Notes (Signed)
Rehab Admissions Coordinator Note:  Patient was screened by Cleatrice Burke for appropriateness for an Inpatient Acute Rehab Consult per PT recommendation  At this time, we are recommending awaiting further surgery plans before determining dispo needs. I will follow .  Cleatrice Burke 09/13/2014, 3:46 PM  I can be reached at [phone].

## 2014-09-13 NOTE — Progress Notes (Signed)
OT Cancellation Note  Patient Details Name: ZELIE ASBILL MRN: [id number] DOB: October 29, 1940   Cancelled Treatment:    Reason Eval/Treat Not Completed: Other (comment). Spoke with nurse and she suggested waiting with therapy right now as pt's HR up to 130's when nurse is talking to her.  Benito Mccreedy OTR/L [phone]  09/13/2014, 11:11 AM

## 2014-09-13 NOTE — Progress Notes (Signed)
Pt's blood sugar 193. Paged MD concerning correctional insulin coverage.

## 2014-09-13 NOTE — Progress Notes (Addendum)
   [facility]   Subjective  - Pain has been an issue.  Her HR has been in the 110's-121 likely secondary to pain.  We will restart her BB this am PO.   Objective 119/34 119 99.2 F (37.3 C) (Oral) 14 99%  Intake/Output Summary (Last 24 hours) at 09/13/14 0735 Last data filed at 09/13/14 0445  Gross per 24 hour  Intake   2340 ml  Output   1335 ml  Net   1005 ml    Left groin clean and dry, placed 4 x 4 in groin. Distally dressings intact without hematoma. Doppler DP and PT Lungs non labored breathing Heart tachycardia   Assessment/Planning: POD #1 Left femoral to anterior tibial bypass with translocated non-reversed great saphenous vein  Pain issues will give PO followed by IV morphine PRN Tachycardia will start PO BB this am Lungs stable UO stable 60-150 cc hourly Abdomin soft taking PO liquids without difficulty Consult by Dr Ninfa Linden: at a minimum, she needs a left foot first ray resection. I will plan to take her to the OR late Wednesday this week. She has significant neuropathy, so surgery may be able to be performed using light anesthesia and an ankle block only. Left groin keep clean and dry ABD pad TID  [name] [facility] 09/13/2014 7:35 AM --  Laboratory Lab Results:  Recent Labs  09/12/14 0949 09/12/14 1503 09/13/14 0250  WBC 9.0  --  8.6  HGB 10.8* 9.5* 9.3*  HCT 33.8* 28.0* 29.2*  PLT 299  --  289   BMET  Recent Labs  09/12/14 0949  09/12/14 1503 09/13/14 0250  NA 132*  --  133* 133*  K 5.0  --  4.4 4.9  CL 96  --   --  100  CO2 26  --   --  27  GLUCOSE 206*  < > 127* 171*  BUN 48*  --   --  39*  CREATININE 1.71*  --   --  1.52*  CALCIUM 10.6*  --   --  9.3  < > = values in this interval not displayed.  COAG Lab Results  Component Value Date   INR 1.04 09/12/2014   INR 1.03 01/26/2013   INR 0.98 06/10/2011   No results found for: PTT    I have examined the patient, reviewed and agree  with above. Having more than typical amount of pain from her incision but the is comfortable with pain meds. Excellent biphasic signal anterior tibial with Doppler. Dressings intact. Appreciate Dr. Trevor Mace assistance. Sodium low. This has been issue in the past and has been on oral sodium supplementation which we will resume.  Burel Kahre, MD 09/13/2014 10:33 AM

## 2014-09-13 NOTE — Progress Notes (Signed)
Inpatient Diabetes Program Recommendations  AACE/ADA: New Consensus Statement on Inpatient Glycemic Control (2013)  Target Ranges:  Prepandial:   less than 140 mg/dL      Peak postprandial:   less than 180 mg/dL (1-2 hours)      Critically ill patients:  140 - 180 mg/dL   Pt ordered home dose of lantus. Pt also takes a "SSI" of humalog from 20-50 units tidwc. Pt ordered cho modified diet and is eating.  Inpatient Diabetes Program Recommendations Correction (SSI): Please add some correction insullin-moderate tidwc while here   Thank you, Rosita Kea, RN, CNS, Diabetes Coordinator  Pager [phone]) 8:00 am to 10:00pm Office [phone])  8:40m - 5:00 pm

## 2014-09-14 ENCOUNTER — Encounter (HOSPITAL_COMMUNITY)
Admission: RE | Disposition: A | Discharge status: Skilled Nursing Facility | Payer: Self-pay | Source: Ambulatory Visit | Attending: Vascular Surgery

## 2014-09-14 ENCOUNTER — Inpatient Hospital Stay (HOSPITAL_COMMUNITY): Payer: PPO | Admitting: Anesthesiology

## 2014-09-14 DIAGNOSIS — Z9889 Other specified postprocedural states: Secondary | ICD-10-CM

## 2014-09-14 HISTORY — PX: AMPUTATION: SHX166

## 2014-09-14 LAB — COMPREHENSIVE METABOLIC PANEL
ALBUMIN: 1.8 g/dL — AB (ref 3.5–5.2)
ALT: 76 U/L — ABNORMAL HIGH (ref 0–35)
ANION GAP: 8 (ref 5–15)
AST: 74 U/L — AB (ref 0–37)
Alkaline Phosphatase: 329 U/L — ABNORMAL HIGH (ref 39–117)
BUN: 37 mg/dL — AB (ref 6–23)
CO2: 23 mmol/L (ref 19–32)
CREATININE: 1.87 mg/dL — AB (ref 0.50–1.10)
Calcium: 9 mg/dL (ref 8.4–10.5)
Chloride: 101 mmol/L (ref 96–112)
GFR, EST AFRICAN AMERICAN: 30 mL/min — AB (ref >90)
GFR, EST NON AFRICAN AMERICAN: 26 mL/min — AB (ref >90)
Glucose, Bld: 209 mg/dL — ABNORMAL HIGH (ref 70–99)
POTASSIUM: 4.3 mmol/L (ref 3.5–5.1)
Sodium: 132 mmol/L — ABNORMAL LOW (ref 135–145)
TOTAL PROTEIN: 5.5 g/dL — AB (ref 6.0–8.3)
Total Bilirubin: 0.6 mg/dL (ref 0.3–1.2)

## 2014-09-14 LAB — GLUCOSE, CAPILLARY
GLUCOSE-CAPILLARY: 218 mg/dL — AB (ref 70–99)
Glucose-Capillary: 209 mg/dL — ABNORMAL HIGH (ref 70–99)
Glucose-Capillary: 173 mg/dL — ABNORMAL HIGH (ref 70–99)
Glucose-Capillary: 174 mg/dL — ABNORMAL HIGH (ref 70–99)

## 2014-09-14 SURGERY — AMPUTATION, FOOT, RAY
Anesthesia: General | Site: Foot | Laterality: Left

## 2014-09-14 MED ORDER — OXYCODONE HCL 5 MG/5ML PO SOLN: 5.0000 mg | Freq: Once | ORAL | Status: DC | PRN

## 2014-09-14 MED ORDER — DIPHENHYDRAMINE HCL 50 MG/ML IJ SOLN
INTRAMUSCULAR | Status: AC
  Administered 2014-09-14: 6.5 mg via INTRAVENOUS
  Filled 2014-09-14: qty 1

## 2014-09-14 MED ORDER — OXYCODONE HCL 5 MG PO TABS
ORAL_TABLET | ORAL | Status: AC
  Administered 2014-09-14: 10 mg via ORAL
  Filled 2014-09-14: qty 2

## 2014-09-14 MED ORDER — 0.9 % SODIUM CHLORIDE (POUR BTL) OPTIME
TOPICAL | Status: DC | PRN
  Administered 2014-09-14: 1000 mL

## 2014-09-14 MED ORDER — CEFAZOLIN SODIUM-DEXTROSE 2-3 GM-% IV SOLR: 2.0000 g | Freq: Once | INTRAVENOUS | Status: DC

## 2014-09-14 MED ORDER — ONDANSETRON HCL 4 MG/2ML IJ SOLN
INTRAMUSCULAR | Status: DC | PRN
Start: 2014-09-14 — End: 2014-09-14
  Administered 2014-09-14: 4 mg via INTRAVENOUS

## 2014-09-14 MED ORDER — OXYCODONE HCL 5 MG PO TABS: 5.0000 mg | ORAL_TABLET | Freq: Once | ORAL | Status: DC | PRN

## 2014-09-14 MED ORDER — ACETAMINOPHEN 325 MG PO TABS
ORAL_TABLET | ORAL | Status: AC
  Administered 2014-09-14: 650 mg via ORAL
  Filled 2014-09-14: qty 2

## 2014-09-14 MED ORDER — SODIUM BICARBONATE 650 MG PO TABS: 20000.0000 mg | ORAL_TABLET | Freq: Two times a day (BID) | ORAL | Status: DC

## 2014-09-14 MED ORDER — FENTANYL CITRATE 0.05 MG/ML IJ SOLN
INTRAMUSCULAR | Status: AC
  Filled 2014-09-14: qty 5

## 2014-09-14 MED ORDER — PROPOFOL 10 MG/ML IV BOLUS
INTRAVENOUS | Status: DC | PRN
  Administered 2014-09-14: 140 mg via INTRAVENOUS

## 2014-09-14 MED ORDER — LIDOCAINE HCL (CARDIAC) 20 MG/ML IV SOLN
INTRAVENOUS | Status: DC | PRN
  Administered 2014-09-14: 50 mg via INTRAVENOUS

## 2014-09-14 MED ORDER — SODIUM BICARBONATE 650 MG PO TABS
1300.0000 mg | ORAL_TABLET | Freq: Two times a day (BID) | ORAL | Status: DC
  Administered 2014-09-14 – 2014-09-20 (×13): 1300 mg via ORAL
  Filled 2014-09-14 (×15): qty 2

## 2014-09-14 MED ORDER — CEFAZOLIN SODIUM-DEXTROSE 2-3 GM-% IV SOLR
INTRAVENOUS | Status: DC | PRN
  Administered 2014-09-14: 2 g via INTRAVENOUS

## 2014-09-14 MED ORDER — FENTANYL CITRATE 0.05 MG/ML IJ SOLN
INTRAMUSCULAR | Status: AC
  Administered 2014-09-14: 50 ug via INTRAVENOUS
  Filled 2014-09-14: qty 2

## 2014-09-14 MED ORDER — FENTANYL CITRATE 0.05 MG/ML IJ SOLN
INTRAMUSCULAR | Status: DC | PRN
  Administered 2014-09-14: 50 ug via INTRAVENOUS

## 2014-09-14 MED ORDER — ONDANSETRON HCL 4 MG/2ML IJ SOLN: 4.0000 mg | Freq: Four times a day (QID) | INTRAMUSCULAR | Status: DC | PRN

## 2014-09-14 MED ORDER — DIPHENHYDRAMINE HCL 50 MG/ML IJ SOLN
6.2500 mg | Freq: Once | INTRAMUSCULAR | Status: AC
  Administered 2014-09-14: 6.5 mg via INTRAVENOUS

## 2014-09-14 MED ORDER — PHENYLEPHRINE HCL 10 MG/ML IJ SOLN
INTRAMUSCULAR | Status: DC | PRN
  Administered 2014-09-14 (×2): 80 ug via INTRAVENOUS

## 2014-09-14 MED ORDER — FENTANYL CITRATE 0.05 MG/ML IJ SOLN
25.0000 ug | INTRAMUSCULAR | Status: DC | PRN
  Administered 2014-09-14 (×2): 50 ug via INTRAVENOUS

## 2014-09-14 SURGICAL SUPPLY — 40 items
BANDAGE ESMARK 6X9 LF (GAUZE/BANDAGES/DRESSINGS) IMPLANT
BLADE SAW SGTL 81X20 HD (BLADE) ×1 IMPLANT
BLADE SURG 10 STRL SS (BLADE) ×2 IMPLANT
BNDG CMPR 9X6 STRL LF SNTH (GAUZE/BANDAGES/DRESSINGS)
BNDG COHESIVE 4X5 TAN STRL (GAUZE/BANDAGES/DRESSINGS) ×2 IMPLANT
BNDG ESMARK 6X9 LF (GAUZE/BANDAGES/DRESSINGS)
BNDG GAUZE ELAST 4 BULKY (GAUZE/BANDAGES/DRESSINGS) ×1 IMPLANT
CUFF TOURNIQUET SINGLE 34IN LL (TOURNIQUET CUFF) IMPLANT
CUFF TOURNIQUET SINGLE 44IN (TOURNIQUET CUFF) IMPLANT
DRAPE U-SHAPE 47X51 STRL (DRAPES) ×4 IMPLANT
DURAPREP 26ML APPLICATOR (WOUND CARE) ×1 IMPLANT
ELECT REM PT RETURN 9FT ADLT (ELECTROSURGICAL) ×2
ELECTRODE REM PT RTRN 9FT ADLT (ELECTROSURGICAL) ×1 IMPLANT
GAUZE SPONGE 4X4 12PLY STRL (GAUZE/BANDAGES/DRESSINGS) ×2 IMPLANT
GAUZE XEROFORM 5X9 LF (GAUZE/BANDAGES/DRESSINGS) ×2 IMPLANT
GLOVE BIO SURGEON STRL SZ8 (GLOVE) ×2 IMPLANT
GLOVE BIOGEL PI IND STRL 8 (GLOVE) ×1 IMPLANT
GLOVE BIOGEL PI INDICATOR 8 (GLOVE) ×1
GLOVE ORTHO TXT STRL SZ7.5 (GLOVE) ×2 IMPLANT
GOWN STRL REUS W/ TWL LRG LVL3 (GOWN DISPOSABLE) ×1 IMPLANT
GOWN STRL REUS W/ TWL XL LVL3 (GOWN DISPOSABLE) ×4 IMPLANT
GOWN STRL REUS W/TWL LRG LVL3 (GOWN DISPOSABLE) ×2
GOWN STRL REUS W/TWL XL LVL3 (GOWN DISPOSABLE) ×2
KIT BASIN OR (CUSTOM PROCEDURE TRAY) ×2 IMPLANT
KIT ROOM TURNOVER OR (KITS) ×2 IMPLANT
NS IRRIG 1000ML POUR BTL (IV SOLUTION) ×2 IMPLANT
PACK ORTHO EXTREMITY (CUSTOM PROCEDURE TRAY) ×2 IMPLANT
PAD ARMBOARD 7.5X6 YLW CONV (MISCELLANEOUS) ×4 IMPLANT
PAD CAST 4YDX4 CTTN HI CHSV (CAST SUPPLIES) ×1 IMPLANT
PADDING CAST COTTON 4X4 STRL (CAST SUPPLIES)
SPONGE GAUZE 4X4 12PLY STER LF (GAUZE/BANDAGES/DRESSINGS) ×1 IMPLANT
SPONGE LAP 18X18 X RAY DECT (DISPOSABLE) ×4 IMPLANT
STAPLER VISISTAT 35W (STAPLE) ×2 IMPLANT
STOCKINETTE IMPERVIOUS LG (DRAPES) IMPLANT
SUT ETHILON 2 0 PSLX (SUTURE) ×4 IMPLANT
TOWEL OR 17X24 6PK STRL BLUE (TOWEL DISPOSABLE) ×2 IMPLANT
TOWEL OR 17X26 10 PK STRL BLUE (TOWEL DISPOSABLE) ×2 IMPLANT
TUBE CONNECTING 12X1/4 (SUCTIONS) ×2 IMPLANT
WATER STERILE IRR 1000ML POUR (IV SOLUTION) ×2 IMPLANT
YANKAUER SUCT BULB TIP NO VENT (SUCTIONS) ×2 IMPLANT

## 2014-09-14 NOTE — Progress Notes (Signed)
VASCULAR LAB PRELIMINARY  ARTERIAL  ABI completed:    RIGHT    LEFT    PRESSURE WAVEFORM  PRESSURE WAVEFORM  BRACHIAL 129 Triphasic BRACHIAL 138 Triphasic  AT BKA  AT 99 Sharp Monophasic  PT BKA  PT 71 Sharp Diminished Monophasic Possibly Collateral Vessel    RIGHT LEFT  ABI BKA 0.72   Patient has been a right BKA for several years. The left ABI indicates moderate arterial flow at rest negative the anterior tibial artery. Flow noted in the posterior tibial region may actually be collateral vessel  Milton Sagona, RVS 09/14/2014, 12:08 PM

## 2014-09-14 NOTE — Transfer of Care (Signed)
Immediate Anesthesia Transfer of Care Note  Patient: [name]  Procedure(s) Performed: Procedure(s): LEFT FOOT FIRST RAY AMPUTATION (Left)  Patient Location: PACU  Anesthesia Type:General  Level of Consciousness: awake, alert  and oriented  Airway & Oxygen Therapy: Patient Spontanous Breathing and Patient connected to nasal cannula oxygen  Post-op Assessment: Report given to RN and Post -op Vital signs reviewed and stable  Post vital signs: Reviewed and stable  Last Vitals:  Filed Vitals:   09/14/14 1613  BP:   Pulse:   Temp: 37.4 C  Resp:     Complications: No apparent anesthesia complications

## 2014-09-14 NOTE — Anesthesia Postprocedure Evaluation (Signed)
  Anesthesia Post-op Note  Patient: [name]  Procedure(s) Performed: Procedure(s): LEFT FOOT FIRST RAY AMPUTATION (Left)  Patient Location: PACU  Anesthesia Type:General  Level of Consciousness: awake and alert   Airway and Oxygen Therapy: Patient Spontanous Breathing  Post-op Pain: moderate  Post-op Assessment: Post-op Vital signs reviewed, Patient's Cardiovascular Status Stable and Respiratory Function Stable  Post-op Vital Signs: Reviewed  Filed Vitals:   09/14/14 1815  BP: 125/59  Pulse: 101  Temp:   Resp: 13    Complications: No apparent anesthesia complications

## 2014-09-14 NOTE — OR Nursing (Signed)
Dr Ninfa Linden requested that great toe not be sent to pathology

## 2014-09-14 NOTE — Progress Notes (Signed)
OT Cancellation Note  Patient Details Name: [name] MRN: [id number] DOB: Jan 08, 1941   Cancelled Treatment:    Reason Eval/Treat Not Completed: Other (comment). Pt plans to have surgery today. Will plan to evaluate after surgery.  Benito Mccreedy OTR/L [phone] 09/14/2014, 9:41 AM

## 2014-09-14 NOTE — Brief Op Note (Signed)
09/12/2014 - 09/14/2014  5:53 PM  PATIENT:  [name]  74 y.o. female  PRE-OPERATIVE DIAGNOSIS:  left foot necrosis, peripheral vascular disease  POST-OPERATIVE DIAGNOSIS:  left foot necrosis , peripheral vascular disease  PROCEDURE:  Procedure(s): LEFT FOOT FIRST RAY AMPUTATION (Left)  SURGEON:  Surgeon(s) and Role:    * Mcarthur Rossetti, MD - Primary  ANESTHESIA:   general  EBL:  Total I/O In: 850 [I.V.:850] Out: 1700 [Urine:1700]  BLOOD ADMINISTERED:none  DRAINS: none   LOCAL MEDICATIONS USED:  NONE  SPECIMEN:  No Specimen  DISPOSITION OF SPECIMEN:  N/A  COUNTS:  YES  TOURNIQUET:  * No tourniquets in log *  DICTATION: .Other Dictation: Dictation Number [id number]  PLAN OF CARE: Admit to inpatient   PATIENT DISPOSITION:  PACU - hemodynamically stable.   Delay start of Pharmacological VTE agent (>24hrs) due to surgical blood loss or risk of bleeding: no

## 2014-09-14 NOTE — Anesthesia Preprocedure Evaluation (Signed)
Anesthesia Evaluation  Patient identified by MRN, date of birth, ID band Patient awake    Reviewed: Allergy & Precautions, NPO status , Patient's Chart, lab work & pertinent test results  Airway Mallampati: II   Neck ROM: full    Dental   Pulmonary former smoker,          Cardiovascular hypertension, + CAD, + Past MI and + Peripheral Vascular Disease     Neuro/Psych  Headaches,  Neuromuscular disease    GI/Hepatic GERD-  ,  Endo/Other  diabetes, Type 2  Renal/GU Renal InsufficiencyRenal disease     Musculoskeletal  (+) Arthritis -,   Abdominal   Peds  Hematology   Anesthesia Other Findings   Reproductive/Obstetrics                             Anesthesia Physical Anesthesia Plan  ASA: III  Anesthesia Plan: General   Post-op Pain Management:    Induction: Intravenous  Airway Management Planned: LMA  Additional Equipment:   Intra-op Plan:   Post-operative Plan:   Informed Consent: I have reviewed the patients History and Physical, chart, labs and discussed the procedure including the risks, benefits and alternatives for the proposed anesthesia with the patient or authorized representative who has indicated his/her understanding and acceptance.     Plan Discussed with: CRNA, Anesthesiologist and Surgeon  Anesthesia Plan Comments:         Anesthesia Quick Evaluation

## 2014-09-14 NOTE — Progress Notes (Addendum)
   [facility]  Subjective  - Doing well over all.  Plan for her to go to the OR today with Dr. Ninfa Linden.   Objective 121/51 115 98.6 F (37 C) (Oral) 15 100%  Intake/Output Summary (Last 24 hours) at 09/14/14 1034 Last data filed at 09/14/14 0700  Gross per 24 hour  Intake   1460 ml  Output   1250 ml  Net    210 ml    Doppler AT/PT signals left foot, dressing in place lower foot Heart tachycardia Lungs non labored breathing Diet NPO for surgery Groin dry distal incisions dressing left in place will change tomorrow  Assessment/Planning: POD #2 Left femoral to anterior tibial bypass with translocated non-reversed great saphenous vein Pain better controlled today  NPO going to OR today  [name] [facility] 09/14/2014 10:34 AM --  Laboratory Lab Results:  Recent Labs  09/12/14 0949 09/12/14 1503 09/13/14 0250  WBC 9.0  --  8.6  HGB 10.8* 9.5* 9.3*  HCT 33.8* 28.0* 29.2*  PLT 299  --  289   BMET  Recent Labs  09/12/14 0949  09/12/14 1503 09/13/14 0250  NA 132*  --  133* 133*  K 5.0  --  4.4 4.9  CL 96  --   --  100  CO2 26  --   --  27  GLUCOSE 206*  < > 127* 171*  BUN 48*  --   --  39*  CREATININE 1.71*  --   --  1.52*  CALCIUM 10.6*  --   --  9.3  < > = values in this interval not displayed.  COAG Lab Results  Component Value Date   INR 1.04 09/12/2014   INR 1.03 01/26/2013   INR 0.98 06/10/2011   No results found for: PTT    I have examined the patient, reviewed and agree with above. A more comfortable today. Palpable popliteal graft pulse. For rate amputation first digit today with Dr. Ninfa Linden  [name] Quigg, MD 09/14/2014 10:55 AM

## 2014-09-14 NOTE — Progress Notes (Addendum)
Inpatient Diabetes Program Recommendations  AACE/ADA: New Consensus Statement on Inpatient Glycemic Control (2013)  Target Ranges:  Prepandial:   less than 140 mg/dL      Peak postprandial:   less than 180 mg/dL (1-2 hours)      Critically ill patients:  140 - 180 mg/dL     Results for AHLIVIA, SALAHUDDIN (MRN [id number]) as of 09/14/2014 10:26  Ref. Range 09/13/2014 08:36 09/13/2014 11:57 09/13/2014 21:41  Glucose-Capillary Latest Range: 70-99 mg/dL 166 (H) 194 (H) 284 (H)    Current Orders: Lantus 55 units QAM      Lantus 60 units QHS    MD- Please add Novolog Moderate SSI tid ac + HS   **Called RN caring for patient today.  Asked RN to please call MD for Novolog SSI orders.    Will follow Wyn Quaker RN, MSN, CDE Diabetes Coordinator Inpatient Diabetes Program Team Pager: [phone] (8a-10p)

## 2014-09-14 NOTE — Progress Notes (Signed)
Pt co's itching around IV & on arms. No redness, swelling or hives.  Anesthesia aware. Benadryl given as ordered. Will continue to monitor.

## 2014-09-15 ENCOUNTER — Encounter (HOSPITAL_COMMUNITY): Payer: Self-pay | Admitting: Orthopaedic Surgery

## 2014-09-15 DIAGNOSIS — R5381 Other malaise: Secondary | ICD-10-CM

## 2014-09-15 DIAGNOSIS — S81802S Unspecified open wound, left lower leg, sequela: Secondary | ICD-10-CM

## 2014-09-15 DIAGNOSIS — Z89511 Acquired absence of right leg below knee: Secondary | ICD-10-CM

## 2014-09-15 LAB — GLUCOSE, CAPILLARY
GLUCOSE-CAPILLARY: 134 mg/dL — AB (ref 70–99)
GLUCOSE-CAPILLARY: 226 mg/dL — AB (ref 70–99)
GLUCOSE-CAPILLARY: 185 mg/dL — AB (ref 70–99)

## 2014-09-15 NOTE — Evaluation (Signed)
Physical Therapy Evaluation Patient Details Name: [name] MRN: [id number] DOB: Mar 18, 1941 Today's Date: 09/15/2014   History of Present Illness  pt presents post L Femoral to Anterior Tibial Bypass with L first ray amputation on 09/14/14.    Clinical Impression  Pt very eager to get up to chair.  Pt needed A donning prosthetic LE and A throughout transfer.  Continue to feel pt would benefit from CIR at D/C to maximize independence prior to returning to home.  Will continue to follow.      Follow Up Recommendations CIR    Equipment Recommendations  None recommended by PT    Recommendations for Other Services Rehab consult     Precautions / Restrictions Precautions Precautions: Fall Precaution Comments: pt with R BKA and prosthetic LE.   Restrictions Weight Bearing Restrictions: Yes LLE Weight Bearing: Weight bearing as tolerated Other Position/Activity Restrictions: WBAT in forefoot offloading Darco      Mobility  Bed Mobility               General bed mobility comments: Sitting EOB on arrival.    Transfers Overall transfer level: Needs assistance Equipment used: None Transfers: Squat Pivot Transfers     Squat pivot transfers: Min assist;+2 safety/equipment     General transfer comment: pt utilizes recliner placed in front of her to A with coming to squat position and pivots towards L side.  2 people present to A with set-up of transfer and for safety.    Ambulation/Gait                Stairs            Wheelchair Mobility    Modified Rankin (Stroke Patients Only)       Balance Overall balance assessment: Needs assistance Sitting-balance support: No upper extremity supported;Feet supported Sitting balance-Leahy Scale: Good     Standing balance support: Bilateral upper extremity supported;During functional activity Standing balance-Leahy Scale: Poor                               Pertinent Vitals/Pain Pain Assessment:  0-10 Pain Score: 6  Pain Location: L LE Pain Descriptors / Indicators: Aching Pain Intervention(s): Monitored during session;Premedicated before session;Repositioned    Home Living Family/patient expects to be discharged to:: Inpatient rehab                      Prior Function Level of Independence: Independent with assistive device(s)               Hand Dominance        Extremity/Trunk Assessment   Upper Extremity Assessment: Defer to OT evaluation             RLE Deficits / Details: R BKA in 2008 and demos good functional movement.   LLE Deficits / Details: L Femoral bypass and new L first ray amputation.  Edema and pain limiting strength and mobility.    Cervical / Trunk Assessment: Normal  Communication   Communication: No difficulties  Cognition Arousal/Alertness: Awake/alert Behavior During Therapy: WFL for tasks assessed/performed Overall Cognitive Status: Within Functional Limits for tasks assessed                      General Comments      Exercises        Assessment/Plan    PT Assessment Patient needs continued PT services  PT  Diagnosis Difficulty walking;Generalized weakness   PT Problem List Decreased strength;Decreased activity tolerance;Decreased range of motion;Decreased balance;Decreased mobility;Decreased coordination;Decreased knowledge of use of DME;Pain;Obesity  PT Treatment Interventions DME instruction;Gait training;Functional mobility training;Therapeutic activities;Therapeutic exercise;Balance training;Patient/family education   PT Goals (Current goals can be found in the Care Plan section) Acute Rehab PT Goals Patient Stated Goal: Get home with her dog.   PT Goal Formulation: With patient Time For Goal Achievement: 09/27/14 Potential to Achieve Goals: Good    Frequency Min 3X/week   Barriers to discharge Decreased caregiver support      Co-evaluation               End of Session Equipment Utilized  During Treatment: Gait belt Activity Tolerance: Patient tolerated treatment well Patient left: in chair;with call bell/phone within reach;with nursing/sitter in room Nurse Communication: Mobility status         Time: 4132-4401 PT Time Calculation (min) (ACUTE ONLY): 28 min   Charges:   PT Evaluation $PT Re-evaluation: 1 Procedure PT Treatments $Therapeutic Activity: 8-22 mins   PT G CodesCatarina Hartshorn, [place] 09/15/2014, 11:25 AM

## 2014-09-15 NOTE — Progress Notes (Signed)
Orthopedic Tech Progress Note Patient Details:  [name] 17-Mar-1941 [id number]  Ortho Devices Type of Ortho Device: Darco shoe Ortho Device/Splint Interventions: Application   Katheren Shams 09/15/2014, 6:12 AM

## 2014-09-15 NOTE — Progress Notes (Signed)
Patient ID: [name], female   DOB: [birth year], 74 y.o.   MRN: [id number] Her left foot dressing is clean and dry.  She has been up with both PT and OT and is being evaluated by Inpatient Rehab.  I'll leave that dressing on for the next 5 days.  I'll also continue to follow-up with her while she is here.

## 2014-09-15 NOTE — Progress Notes (Signed)
Subjective: Interval History: none..   Objective: Vital signs in last 24 hours: Temp:  [97.8 F (36.6 C)-100.9 F (38.3 C)] 98.7 F (37.1 C) (02/04 0802) Pulse Rate:  [77-126] 125 (02/04 1000) Resp:  [10-48] 15 (02/04 1000) BP: (99-139)/(43-93) 109/47 mmHg (02/04 0802) SpO2:  [86 %-100 %] 99 % (02/04 1000)  Intake/Output from previous day: 02/03 0701 - 02/04 0700 In: 0932 [P.O.:240; I.V.:1450] Out: 2700 [Urine:2700] Intake/Output this shift: Total I/O In: 240 [P.O.:240] Out: -   Easily palpable left popliteal pulse. Foot well-perfused. Surgical incisions healing nicely. Dressing intact from great toe amputation  Lab Results:  Recent Labs  09/12/14 1503 09/13/14 0250  WBC  --  8.6  HGB 9.5* 9.3*  HCT 28.0* 29.2*  PLT  --  289   BMET  Recent Labs  09/13/14 0250 09/14/14 1914  NA 133* 132*  K 4.9 4.3  CL 100 101  CO2 27 23  GLUCOSE 171* 209*  BUN 39* 37*  CREATININE 1.52* 1.87*  CALCIUM 9.3 9.0    Studies/Results: Mr Foot Left Wo Contrast  09/09/2014   ADDENDUM REPORT: 09/09/2014 08:57  ADDENDUM: The original report was by Dr. Van Clines. The following addendum is by Dr. Van Clines:  I discussed this case with Dr. Quentin Cornwall at 8:50 a.m. on 09/09/2014.  He noted that the patient did have exposed bone along the great toe wound, but also he took a sample of the thick fluid from the region which did demonstrate crystal deposition compatible with gout. It is quite possible that the extensive edema in the first metatarsal and first digit proximal phalanx as well as the medial sesamoid is multifactorial -clearly there is biochemical evidence of gout (although no large marginal erosion is seen on imaging), but the presence of exposed periosteum would also suggest a high likelihood of concomitant osteomyelitis.   Electronically Signed   By: Sherryl Barters M.D.   On: 09/09/2014 08:57   09/09/2014   CLINICAL DATA:  Diabetes. Peripheral vascular disease. Wounds  along the right lateral aspect of the first metatarsal head. Renal insufficiency.  EXAM: MRI OF THE LEFT FOREFOOT WITHOUT CONTRAST  TECHNIQUE: Multiplanar, multisequence MR imaging was performed. No intravenous contrast was administered.  COMPARISON:  08/16/2014  FINDINGS: Abnormal osseous edema in the first metatarsal head and adjacent shaft, medial sesamoid adjacent to the first metatarsal head, the second metatarsal head and metaphysis, the third toe proximal phalanx, the fourth toe proximal phalanx, in the fifth digit metatarsal head and adjacent shaft. If although the bony demineralization on conventional radiography reduces conspicuity, the transverse band of low signal in the fifth metatarsal head is suspicious for a fracture as a potential cause for the osseous edema. Moreover, there is some potential linear low T1 signal in the head of the second metatarsal which could be from a fracture. The proximal phalanx of the great toe is mildly shortened.  Dorsal subcutaneous edema noted in the foot. There is fatty atrophy of the plantar musculature of the foot. Medial to the head of the first metatarsal there is irregularity and disruption of the skin surface potentially representing ulceration. No definite joint effusion.  IMPRESSION: 1. Abnormal osseous edema in the heads of the first, second, and fifth metatarsals; and in the proximal phalanges of the first, third, and fourth digits. Some of this, particularly the involvement in the first digit, is highly suspicious for osteomyelitis. However, the abnormal edema in the head of the fifth metatarsal and probably that in the second metatarsal  appears to probably be due to nondisplaced fracture. 2. Subcutaneous edema in the dorsal foot possibly from cellulitis. 3. Muscular atrophy. 4. Skin ulceration medial to the head of the first metatarsal.  Electronically Signed: By: Sherryl Barters M.D. On: 09/06/2014 09:02   Dg Foot Complete Left  08/16/2014   CLINICAL  DATA:  Diabetic with left great toe wound. Evaluate for osteomyelitis.  EXAM: LEFT FOOT - COMPLETE 3+ VIEW  COMPARISON:  None.  FINDINGS: Overlying bandage obscures bony detail. Moderate osteopenia. Hammertoe deformities. Forefoot soft tissue swelling. No radiopaque foreign object. No gross osseous destruction. No soft tissue gas. Small Achilles spur.  IMPRESSION: Soft tissue swelling, without plain film evidence of osteomyelitis. Mild degradation secondary to overlying bandage. If ongoing clinical concern of osteomyelitis, consider contrast-enhanced MRI.   Electronically Signed   By: Abigail Miyamoto M.D.   On: 08/16/2014 12:55   Anti-infectives: Anti-infectives    Start     Dose/Rate Route Frequency Ordered Stop   09/14/14 1745  ceFAZolin (ANCEF) IVPB 2 g/50 mL premix  Status:  Discontinued     2 g100 mL/hr over 30 Minutes Intravenous  Once 09/14/14 1733 09/14/14 1958   09/13/14 0000  cefUROXime (ZINACEF) 1.5 g in dextrose 5 % 50 mL IVPB     1.5 g100 mL/hr over 30 Minutes Intravenous Every 12 hours 09/12/14 1730 09/13/14 1205   09/11/14 1354  cefUROXime (ZINACEF) 1.5 g in dextrose 5 % 50 mL IVPB     1.5 g100 mL/hr over 30 Minutes Intravenous 30 min pre-op 09/11/14 1354 09/12/14 1154      Assessment/Plan: s/p Procedure(s): LEFT FOOT FIRST RAY AMPUTATION (Left) Stable postop day 3 from left femoral to anterior tibial bypass. Did well with partial foot amputation yesterday. Mobilization per Dr. Ninfa Linden.   LOS: 3 days   Drako Maese 09/15/2014, 10:16 AM

## 2014-09-15 NOTE — Op Note (Signed)
NAMEJISSEL, SLAVENS              ACCOUNT NO.:  [id number]  MEDICAL RECORD NO.:  [id number]  LOCATION:  3S10C                        FACILITY:  [facility]  PHYSICIAN:  Lind Guest. Ninfa Linden, M.D.DATE OF BIRTH:  02/14/1941  DATE OF PROCEDURE:  09/14/2014 DATE OF DISCHARGE:                              OPERATIVE REPORT   PREOPERATIVE DIAGNOSES:  Left foot ischemia with first ray exposed bone, gouty arthropathy changes, and infection.  POSTOPERATIVE DIAGNOSES:  Left foot ischemia with first ray exposed bone, gouty arthropathy changes, and infection.  PROCEDURE:  Left foot first ray resection/amputation.  SURGEON:  Lind Guest. Ninfa Linden, M.D.  ANESTHESIA:  General.  ANTIBIOTICS:  2 g of IV Ancef.  BLOOD LOSS:  Less than 50 mL.  COMPLICATIONS:  None.  INDICATIONS:  Ms. Stinnette is a very pleasant 74 year old female, well known to me.  Two days ago, she had arterial bypass grafting of her left leg due to severe ischemic changes and chronic wounds on her left foot, mainly involving her first ray.  Her first ray has an exposed MTP joint with gouty changes and obvious gross purulence.  In light of the improved blood flow to her foot, she understands that she still will not heal this wound and given the fact the infections down the bone, we are recommending a great toe amputation with the first ray resection to the metatarsal.  She understands this completely.  She has a wound on the lateral aspect of her foot, it just appears to be a scab.  I am going to unroof this as well to see if we can get some healing.  The risks and benefits of surgery were explained to her in detail and she does wish to proceed.  DESCRIPTION OF PROCEDURE:  After informed consent was obtained, appropriate left foot was marked.  She was brought to the operating room, placed supine on the operating table.  General anesthesia was then obtained.  Her left foot and ankle were prepped and draped with  Betadine scrub and paint.  Time-out was called, she was identified as correct patient, correct left foot.  I then easily removed the eschar with a sharp #10 blade over the lateral aspect of her foot.  It was not full thickness at all and there was excellent bleeding.  I then used a wedge resection to remove the great toe.  We found gross purulence.  I was able to use an oscillating saw to cut the metatarsal at the proximal third of the metatarsal.  I then used a #10 blade to remove necrotic skin and soft tissue and tendons around the great toe.  I thoroughly irrigated this as well and got good clean tissue and very brisk blood flow, was very pleasing to see significant amount of blood flow around this area and bleeding.  Hemostasis was obtained with electrocautery.  I then irrigated the wound thoroughly again with normal saline solution. I was able to reapproximate the skin with nice padding with interrupted 2-0 nylon suture.  Xeroform and well- padded sterile dressing were placed around the foot.  She was awakened, extubated, and taken to the recovery room in stable condition.  All final counts were correct.  There  were no complications noted.     Lind Guest. Ninfa Linden, M.D.     CYB/MEDQ  D:  09/14/2014  T:  09/15/2014  Job:  [id number]

## 2014-09-15 NOTE — Progress Notes (Signed)
Orthopedic Tech Progress Note Patient Details:  [name] July 06, 1941 [id number] Patient already was supplied with darco shoe by 3rd shift Ortho tech Patient ID: [name], female   DOB: 10/01/1940, 74 y.o.   MRN: [id number]   Fenton Foy 09/15/2014, 12:17 PM

## 2014-09-15 NOTE — Evaluation (Signed)
Occupational Therapy Evaluation Patient Details Name: [name] MRN: [id number] DOB: May 05, 1941 Today's Date: 09/15/2014    History of Present Illness pt presents post L Femoral to Anterior Tibial Bypass with L first ray amputation on 09/14/14.     Clinical Impression   PTA pt was independent with use of assistive devices. Pt is motivated to return to independence and to get home to her dog. Pt is limited by LLE pain and decreased ROM and requires (A) for functional mobility and LB ADLs. Pt would be a good candidate for CIR due to her motivation to return home. Pt will benefit from acute OT to address independence with ADLs.     Follow Up Recommendations  CIR;Supervision/Assistance - 24 hour    Equipment Recommendations  None recommended by OT    Recommendations for Other Services       Precautions / Restrictions Precautions Precautions: Fall Precaution Comments: pt with R BKA and prosthetic LE.   Restrictions Weight Bearing Restrictions: Yes LLE Weight Bearing: Weight bearing as tolerated Other Position/Activity Restrictions: WBAT in forefoot offloading Darco      Mobility Bed Mobility Overal bed mobility: Needs Assistance;+2 for physical assistance Bed Mobility: Sit to Supine       Sit to supine: Mod assist   General bed mobility comments: (A) to get LEs onto bed. pt able to assist with UEs to scoot up in bed.   Transfers Overall transfer level: Needs assistance Equipment used: None Transfers: Stand Pivot Transfers   Stand pivot transfers: Min assist       General transfer comment: Positioned recliner in front of Left side of bed. Lowered upper rail and raised lower rail to allow pt to hold rail to stand. Pt then pivoted to her left to sit with Min (A) from behind.     Balance Overall balance assessment: Needs assistance Sitting-balance support: No upper extremity supported;Feet supported Sitting balance-Leahy Scale: Good     Standing balance support:  Bilateral upper extremity supported;During functional activity Standing balance-Leahy Scale: Poor                              ADL Overall ADL's : Needs assistance/impaired Eating/Feeding: Independent;Sitting   Grooming: Set up;Sitting   Upper Body Bathing: Set up;Sitting   Lower Body Bathing: Sit to/from stand;Maximal assistance   Upper Body Dressing : Set up;Sitting   Lower Body Dressing: Maximal assistance;Sit to/from stand   Toilet Transfer: Minimal assistance;Stand-pivot (recliner>bed) Toilet Transfer Details (indicate cue type and reason): positioned recliner chair in front of bed. Lowered rail on bed and raised rail to the right to enable pt to stand and pivot to LEFT side per her preference. Pt required min A to stand and for safety.            General ADL Comments: Pt limited by LLE pain but required min (A). HR rose to 112 during mobility but quickly returned to mid 80's.      Vision  Pt reports no change from baseline.                    Perception Perception Perception Tested?: No       Pertinent Vitals/Pain Pain Assessment: 0-10 Pain Score: 7  Pain Location: LLE after moving Pain Descriptors / Indicators: Aching;Shooting Pain Intervention(s): Limited activity within patient's tolerance;Monitored during session;Repositioned;Patient requesting pain meds-RN notified     Hand Dominance     Extremity/Trunk Assessment Upper  Extremity Assessment Upper Extremity Assessment: Overall WFL for tasks assessed   Lower Extremity Assessment Lower Extremity Assessment: Defer to PT evaluation   Cervical / Trunk Assessment Cervical / Trunk Assessment: Normal   Communication Communication Communication: No difficulties   Cognition Arousal/Alertness: Awake/alert Behavior During Therapy: WFL for tasks assessed/performed Overall Cognitive Status: Within Functional Limits for tasks assessed                                Home Living  Family/patient expects to be discharged to:: Inpatient rehab                                        Prior Functioning/Environment Level of Independence: Independent with assistive device(s)        Comments: pt reports that she sponge bathes but is Independent    OT Diagnosis: Generalized weakness;Acute pain   OT Problem List: Decreased strength;Decreased range of motion;Decreased activity tolerance;Impaired balance (sitting and/or standing);Pain   OT Treatment/Interventions: Self-care/ADL training;Therapeutic exercise;Energy conservation;DME and/or AE instruction;Therapeutic activities;Patient/family education;Balance training    OT Goals(Current goals can be found in the care plan section) Acute Rehab OT Goals Patient Stated Goal: to get back to independence and be with my dog OT Goal Formulation: With patient Time For Goal Achievement: 09/29/14 Potential to Achieve Goals: Good ADL Goals Pt Will Perform Grooming: with min assist;standing Pt Will Perform Lower Body Bathing: with min assist;sit to/from stand Pt Will Perform Lower Body Dressing: with min assist;sit to/from stand Pt Will Transfer to Toilet: with min assist;ambulating;bedside commode Pt Will Perform Toileting - Clothing Manipulation and hygiene: with min assist;sit to/from stand  OT Frequency: Min 2X/week    End of Session Equipment Utilized During Treatment: Gait belt;Other (comment) (LLE boot, RLE prosthetic) Nurse Communication: Patient requests pain meds  Activity Tolerance: Patient tolerated treatment well Patient left: in bed;with call bell/phone within reach   Time: 1453-1517 OT Time Calculation (min): 24 min Charges:  OT General Charges $OT Visit: 1 Procedure OT Evaluation $Initial OT Evaluation Tier I: 1 Procedure OT Treatments $Self Care/Home Management : 8-22 mins G-Codes:    Villa Herb M 10/02/2014, 4:03 PM  Cyndie Chime, OTR/L Occupational Therapist [phone]  (pager)

## 2014-09-15 NOTE — Progress Notes (Signed)
Medicare Important Message given? YES  (If response is "NO", the following Medicare IM given date fields will be blank)  Date Medicare IM given: 09/15/14 Medicare IM given by:  Dahlia Client [employer]

## 2014-09-15 NOTE — Progress Notes (Signed)
Rehab Admissions Coordinator Note:  Patient was screened by Cleatrice Burke for appropriateness for an Inpatient Acute Rehab Consult per PT recommendation  At this time, we are recommending Inpatient Rehab consult. Please place order.   Cleatrice Burke 09/15/2014, 2:47 PM  I can be reached at [phone].

## 2014-09-15 NOTE — Consult Note (Signed)
Physical Medicine and Rehabilitation Consult   Reason for Consult: Left 1st Ray amputation and L-Fem-Tib bypass graft due to ischemic LLE with gangrenous foot wound. H/o R-BKA Referring Physician: Dr. Donnetta Hutching   HPI: [name] is a 74 y.o. 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 She was admitted on 09/12/14 for Left femoral to anterior tibial bypass with translocated non-reversed great saphenous vein by Dr. Donnetta Hutching. Dr. Kathrynn Speed was consulted for input on foot wound and recommended amputation of left great toe. On 09/14/14, patient underwent left foot 1st Ray amputation and is WBAT with offloading Darco shoe. PT/OT evaluations done yesterday and patient was limited by pain but showed good motivation. CIR was recommended by MD and Rehab team  for follow up therapy.     Review of Systems  HENT: Negative for hearing loss.   Eyes: Positive for blurred vision.  Respiratory: Negative for cough and shortness of breath.   Cardiovascular: Negative for chest pain and palpitations.  Musculoskeletal: Positive for back pain.  Neurological: Negative for headaches.  Psychiatric/Behavioral: Negative for depression. The patient is not nervous/anxious.       Past Medical History  Diagnosis Date  . Hypertension   . Hyperlipidemia   . History of atrial tachycardia CONTROLLED W/ LOPRESSOR  . Peripheral vascular disease S/P RIGHT BKA  . Headache(784.0)   . Peripheral neuropathy LEGS AND HANDS  . Arthritis   . S/P BKA (below knee amputation) unilateral RIGHT  . GERD (gastroesophageal reflux disease)   . Anemia   . Blood transfusion   . Diabetes mellitus ORAL AND INSULIN MEDS  . Macular degeneration of both eyes   . Glaucoma   . Retinopathy due to secondary diabetes mellitus   . Insomnia   . Constipation   . History of bladder cancer TCC AND  CIS  . MI (mitral incompetence)   . Coronary artery disease   . Chronic kidney disease   . Cancer     bladder  . Myocardial infarction     Past Surgical History  Procedure Laterality Date  . I & d right below knee amputation wound  06-10-2011  . Cysto / resection bladder bx's  04-01-11  &  11-26-10  . Below knee leg amputation  05-02-2007    RIGHT  . Right foot i & d / removal necrotic bone  JUN  &  AUG 2008  . Right great toe amputation  11-05-2006  . Laparoscopic cholecystectomy  12-10-1999  . Back surgery  [place]  . Appendectomy  1963  . Cataract extraction w/ intraocular lens  implant, bilateral    . Cystoscopy with biopsy  10/14/2011    Procedure: CYSTOSCOPY WITH BIOPSY;  Surgeon: Claybon Jabs, MD;  Location: [facility];  Service: Urology;  Laterality: N/A;  BLADDER BIOPSY  . Coronary stent placement    . Abdominal hysterectomy    . Revision urostomy cutaneous    . Revision urostomy cutaneous    . Abdominal angiogram N/A 09/08/2014    Procedure: ABDOMINAL ANGIOGRAM;  Surgeon: Conrad Reidville, MD;  Location: [facility] CATH LAB;  Service: Cardiovascular;  Laterality: N/A;  . Femoral-popliteal bypass graft Left 09/12/2014    Procedure:  LEFT FEMORAL-POPLITEAL ARTERY BYPASS GRAFT USING NON REVERSE LEFT GREATER SAPHENOUS VEIN;  Surgeon: Rosetta Posner, MD;  Location: [facility] OR;  Service: Vascular;  Laterality: Left;  . Amputation Left 09/14/2014    Procedure: LEFT FOOT FIRST RAY AMPUTATION;  Surgeon: Mcarthur Rossetti, MD;  Location: [place];  Service: Orthopedics;  Laterality: Left;    Family History  Problem Relation Age of Onset  . Heart failure Mother     died age 17  . Sudden death Father 20  . Heart disease Father   . Hypertension Father   . Heart attack Father   . Diabetes Brother   . Heart attack Brother   . AAA (abdominal aortic aneurysm) Brother     Social History:  Divorced but ex husband lives next door/supportive.   Independent for ambulating  household distances and uses wheelchair for longer distances.  Manges her home. Handicapped daughter lives with her.  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 reports that she quit smoking about 38 years ago. Her smoking use included Cigarettes. She has a 34 pack-year smoking history. She has never used smokeless tobacco. She reports that she drinks alcohol. She reports that she does not use illicit drugs.    Allergies  Allergen Reactions  . Biaxin [Clarithromycin] Itching  . Clarithromycin Itching  . Lisinopril Other (See Comments)    Increased creatinine levels (pt currently taking low dose)  . Metformin And Related Other (See Comments)    Increased creatinine levels    Medications Prior to Admission  Medication Sig Dispense Refill  . acetaminophen (TYLENOL) 500 MG tablet Take 1,000 mg by mouth every 6 (six) hours as needed.     Marland Kitchen amoxicillin (AMOXIL) 500 MG tablet Take 500 mg by mouth 3 (three) times daily.    Marland Kitchen aspirin 81 MG chewable tablet Chew 81 mg by mouth at bedtime.     Marland Kitchen atorvastatin (LIPITOR) 80 MG tablet Take 1 tablet (80 mg total) by mouth every evening. 90 tablet 2  . furosemide (LASIX) 40 MG tablet Take 80 mg by mouth 2 (two) times daily.     . insulin glargine (LANTUS) 100 UNIT/ML injection Inject 55-60 Units into the skin 2 (two) times daily. 55 units in the morning and 60 units at night    . insulin lispro (HUMALOG) 100 UNIT/ML injection Inject 20-50 Units into the skin daily as needed for high blood sugar. PER SLIDING SCALE    . isosorbide mononitrate (IMDUR) 30 MG 24 hr tablet Take 1 tablet (30 mg total) by mouth daily. 30 tablet 11  . Melatonin 10 MG TABS Take 10 mg by mouth at bedtime.     . metoprolol succinate (TOPROL-XL) 100 MG 24 hr tablet TAKE 1 TABLET (100 MG TOTAL) BY MOUTH EVERY EVENING. TAKE WITH OR IMMEDIATELY FOLLOWING A MEAL. 90 tablet 1  . Multiple Vitamins-Minerals (PRESERVISION AREDS PO) Take 1 capsule by mouth 2 (two) times daily.    . nitroGLYCERIN (NITROSTAT)  0.4 MG SL tablet Place 0.4 mg under the tongue every 5 (five) minutes as needed for chest pain.    Marland Kitchen omeprazole (PRILOSEC) 20 MG capsule Take 20 mg by mouth daily as needed (gurd).     . polyethylene glycol (MIRALAX / GLYCOLAX) packet Take 17 g by mouth daily. 14 each 0  . potassium chloride SA (K-DUR,KLOR-CON) 20 MEQ tablet Take 20 mEq by mouth daily.     . Psyllium (VEGETABLE LAXATIVE PO) Take 4 tablets by mouth at bedtime.    . SODIUM BICARBONATE PO Take 20 g by mouth 2 (two) times daily.    . clopidogrel (PLAVIX) 75 MG tablet  TAKE ONE TABLET BY MOUTH DAILY 30 tablet 0    Home: Home Living Family/patient expects to be discharged to:: Inpatient rehab Living Arrangements: Non-relatives/Friends, Children  Functional History: Prior Function Level of Independence: Independent with assistive device(s) Functional Status:  Mobility: Bed Mobility Overal bed mobility: Needs Assistance, +2 for physical assistance Bed Mobility: Supine to Sit Supine to sit: Mod assist, +2 for physical assistance, HOB elevated General bed mobility comments: Sitting EOB on arrival.   Transfers Overall transfer level: Needs assistance Equipment used: None Transfers: Squat Pivot Transfers Squat pivot transfers: Min assist, +2 safety/equipment General transfer comment: pt utilizes recliner placed in front of her to A with coming to squat position and pivots towards L side.  2 people present to A with set-up of transfer and for safety.        ADL:    Cognition: Cognition Overall Cognitive Status: Within Functional Limits for tasks assessed Orientation Level: Oriented X4 Cognition Arousal/Alertness: Awake/alert Behavior During Therapy: WFL for tasks assessed/performed Overall Cognitive Status: Within Functional Limits for tasks assessed  Blood pressure 112/40, pulse 108, temperature 99.3 F (37.4 C), temperature source Oral, resp. rate 13, height 5\' 4"  (1.626 m), weight 95.709 kg (211 lb), SpO2 98  %. Physical Exam  Nursing note and vitals reviewed. Constitutional: She is oriented to person, place, and time. She appears well-developed and well-nourished.  HENT:  Head: Normocephalic and atraumatic.  Eyes: Conjunctivae are normal. Pupils are equal, round, and reactive to light.  Neck: Normal range of motion. Neck supple.  Cardiovascular: Normal rate and regular rhythm.   Respiratory: Effort normal and breath sounds normal.  GI: Soft. Bowel sounds are normal. She exhibits no distension. There is no tenderness.  Patent urostomy mid-abdominal area.   Musculoskeletal: She exhibits tenderness (left shin. ).  Left thigh and calf incisions intact with steri strips in place. R-BKA well healed. 2+ edema LLE.   Neurological: She is alert and oriented to person, place, and time.  Moves BUE without difficulty.  Able to lift left off pillow 2+/5 HF, KE . Ankle limited due to pain/wrap. Sensation decreased to LT at the foot  Skin: Skin is warm and dry.  Left shin with erythematous blistered area. Compressive dressing left foot.   Psychiatric: She has a normal mood and affect. Her behavior is normal. Judgment and thought content normal.    Results for orders placed or performed during the hospital encounter of 09/12/14 (from the past 24 hour(s))  Glucose, capillary     Status: Abnormal   Collection Time: 09/14/14  4:06 PM  Result Value Ref Range   Glucose-Capillary 209 (H) 70 - 99 mg/dL   Comment 1 Documented in Chart   Glucose, capillary     Status: Abnormal   Collection Time: 09/14/14  6:00 PM  Result Value Ref Range   Glucose-Capillary 173 (H) 70 - 99 mg/dL  Comprehensive metabolic panel     Status: Abnormal   Collection Time: 09/14/14  7:14 PM  Result Value Ref Range   Sodium 132 (L) 135 - 145 mmol/L   Potassium 4.3 3.5 - 5.1 mmol/L   Chloride 101 96 - 112 mmol/L   CO2 23 19 - 32 mmol/L   Glucose, Bld 209 (H) 70 - 99 mg/dL   BUN 37 (H) 6 - 23 mg/dL   Creatinine, Ser 1.87 (H) 0.50 -  1.10 mg/dL   Calcium 9.0 8.4 - 10.5 mg/dL   Total Protein 5.5 (L) 6.0 - 8.3 g/dL   Albumin  1.8 (L) 3.5 - 5.2 g/dL   AST 74 (H) 0 - 37 U/L   ALT 76 (H) 0 - 35 U/L   Alkaline Phosphatase 329 (H) 39 - 117 U/L   Total Bilirubin 0.6 0.3 - 1.2 mg/dL   GFR calc non Af Amer 26 (L) >90 mL/min   GFR calc Af Amer 30 (L) >90 mL/min   Anion gap 8 5 - 15  Glucose, capillary     Status: Abnormal   Collection Time: 09/14/14  9:44 PM  Result Value Ref Range   Glucose-Capillary 174 (H) 70 - 99 mg/dL  Glucose, capillary     Status: Abnormal   Collection Time: 09/15/14  8:01 AM  Result Value Ref Range   Glucose-Capillary 134 (H) 70 - 99 mg/dL  Glucose, capillary     Status: Abnormal   Collection Time: 09/15/14  3:31 PM  Result Value Ref Range   Glucose-Capillary 226 (H) 70 - 99 mg/dL   No results found.  Assessment/Plan: Diagnosis: left first toe amp and BPG. Hx of right BKA 8 yrs ago 1. Does the need for close, 24 hr/day medical supervision in concert with the patient's rehab needs make it unreasonable for this patient to be served in a less intensive setting? Yes 2. Co-Morbidities requiring supervision/potential complications: pain control, wound care, CKD, DM2, htn 3. Due to bladder management, bowel management, safety, skin/wound care, disease management, medication administration, pain management and patient education, does the patient require 24 hr/day rehab nursing? Yes 4. Does the patient require coordinated care of a physician, rehab nurse, PT (1-2 hrs/day, 5 days/week) and OT (1-2 hrs/day, 5 days/week) to address physical and functional deficits in the context of the above medical diagnosis(es)? Yes Addressing deficits in the following areas: balance, endurance, locomotion, strength, transferring, bowel/bladder control, bathing, dressing, feeding, grooming and toileting 5. Can the patient actively participate in an intensive therapy program of at least 3 hrs of therapy per day at least 5 days  per week? Yes 6. The potential for patient to make measurable gains while on inpatient rehab is excellent 7. Anticipated functional outcomes upon discharge from inpatient rehab are modified independent  with PT, modified independent with OT, n/a with SLP. 8. Estimated rehab length of stay to reach the above functional goals is: 7 days 9. Does the patient have adequate social supports and living environment to accommodate these discharge functional goals? Yes 10. Anticipated D/C setting: Home 11. Anticipated post D/C treatments: [place] therapy 12. Overall Rehab/Functional Prognosis: excellent  RECOMMENDATIONS: This patient's condition is appropriate for continued rehabilitative care in the following setting: CIR Patient has agreed to participate in recommended program. Yes Note that insurance prior authorization may be required for reimbursement for recommended care.  Comment: Rehab Admissions Coordinator to follow up.  Thanks,  Meredith Staggers, MD, Mellody Drown     09/15/2014

## 2014-09-16 DIAGNOSIS — I739 Peripheral vascular disease, unspecified: Secondary | ICD-10-CM

## 2014-09-16 LAB — GLUCOSE, CAPILLARY
GLUCOSE-CAPILLARY: 150 mg/dL — AB (ref 70–99)
Glucose-Capillary: 122 mg/dL — ABNORMAL HIGH (ref 70–99)
Glucose-Capillary: 111 mg/dL — ABNORMAL HIGH (ref 70–99)

## 2014-09-16 LAB — CBC WITH DIFFERENTIAL/PLATELET
BASOS PCT: 0 % (ref 0–1)
Basophils Absolute: 0 10*3/uL (ref 0.0–0.1)
Eosinophils Absolute: 0.5 10*3/uL (ref 0.0–0.7)
Eosinophils Relative: 6 % — ABNORMAL HIGH (ref 0–5)
HCT: 26.2 % — ABNORMAL LOW (ref 36.0–46.0)
Hemoglobin: 8.3 g/dL — ABNORMAL LOW (ref 12.0–15.0)
Lymphocytes Relative: 14 % (ref 12–46)
Lymphs Abs: 1.3 10*3/uL (ref 0.7–4.0)
MCH: 26.3 pg (ref 26.0–34.0)
MCHC: 31.7 g/dL (ref 30.0–36.0)
MCV: 82.9 fL (ref 78.0–100.0)
MONO ABS: 0.8 10*3/uL (ref 0.1–1.0)
Monocytes Relative: 9 % (ref 3–12)
Neutro Abs: 6.9 10*3/uL (ref 1.7–7.7)
Neutrophils Relative %: 71 % (ref 43–77)
PLATELETS: 342 10*3/uL (ref 150–400)
RBC: 3.16 MIL/uL — ABNORMAL LOW (ref 3.87–5.11)
RDW: 14.2 % (ref 11.5–15.5)
WBC: 9.6 10*3/uL (ref 4.0–10.5)

## 2014-09-16 LAB — CBC
HEMATOCRIT: 23.1 % — AB (ref 36.0–46.0)
HEMOGLOBIN: 7.4 g/dL — AB (ref 12.0–15.0)
MCH: 26.3 pg (ref 26.0–34.0)
MCHC: 32 g/dL (ref 30.0–36.0)
MCV: 82.2 fL (ref 78.0–100.0)
Platelets: 317 10*3/uL (ref 150–400)
RBC: 2.81 MIL/uL — AB (ref 3.87–5.11)
RDW: 14.3 % (ref 11.5–15.5)
WBC: 8.7 10*3/uL (ref 4.0–10.5)

## 2014-09-16 LAB — TSH: TSH: 1.325 u[IU]/mL (ref 0.350–4.500)

## 2014-09-16 MED ORDER — INSULIN ASPART 100 UNIT/ML ~~LOC~~ SOLN: 0.0000 [IU] | Freq: Every day | SUBCUTANEOUS | Status: DC

## 2014-09-16 MED ORDER — SODIUM CHLORIDE 0.9 % IV SOLN
INTRAVENOUS | Status: DC
  Administered 2014-09-16 – 2014-09-18 (×4): via INTRAVENOUS

## 2014-09-16 MED ORDER — INSULIN ASPART 100 UNIT/ML ~~LOC~~ SOLN
0.0000 [IU] | Freq: Three times a day (TID) | SUBCUTANEOUS | Status: DC
  Administered 2014-09-16: 2 [IU] via SUBCUTANEOUS
  Administered 2014-09-16: 1 [IU] via SUBCUTANEOUS
  Administered 2014-09-17 (×2): 2 [IU] via SUBCUTANEOUS
  Administered 2014-09-17: 3 [IU] via SUBCUTANEOUS
  Administered 2014-09-18 – 2014-09-19 (×3): 1 [IU] via SUBCUTANEOUS
  Administered 2014-09-20: 3 [IU] via SUBCUTANEOUS

## 2014-09-16 NOTE — Progress Notes (Addendum)
Pt's HR elevated sinus tach 130s-160s. [name], PA at bedside to see pt, consulted cardiology. Scheduled p.o. metoprolol recently given at 0934. Will continue to monitor.

## 2014-09-16 NOTE — Progress Notes (Addendum)
Rehab admissions - Evaluated for possible admission.  I met briefly with patient and left rehab brochures at the bedside.  Patient would like to admit to inpatient rehab.  I will seek insurance authorization.  I will follow up again Monday am.  Call me for questions.  # [phone]

## 2014-09-16 NOTE — Consult Note (Signed)
CARDIOLOGY CONSULT NOTE     Patient ID: ANNESHA DELGRECO MRN: [id number] DOB/AGE: Nov 05, 1940 74 y.o.  Admit date: 09/12/2014 Referring Physician Tinnie Gens MD Primary Physician Gerrit Heck, MD Primary Cardiologist Candee Furbish MD Reason for Consultation tachycardia  HPI: Very pleasant 74 yo WF admitted 09/12/14 for treatment of osteomyelitis with nonhealing wound of first metatarsal head. She underwent left fem-anterior tibial bypass on 09/12/14. She had amputation of the left first toe on 09/14/14. Today she was noted to have increased HR with sinus tachycardia and brief runs of PAT. These are asymptomatic. She does feel dehydrated. She has no chest pain or SOB. She is not aware of tachycardia or palpitations. She has been on metoprolol 100 mg daily.  She has a history of coronary artery disease status post MI in January of 2014 following bladder surgery at [facility], uncontrolled diabetes, hypertension, hyperlipidemia, peripheral vascular disease with right knee BKA, chronic kidney disease.  Prior hospitalizations 01/26/13 with chest pain, radiation to left arm with shortness of breath. Troponin was normal, EKG showed subtle ST segment depression in inferior leads. Echocardiogram showed normal EF, nuclear stress test showed no definitive evidence of prior infarction or ischemia. Attenuation artifact noted. Large area of attenuation involving the lateral wall may be from prior infarct. Medical management.  Past Medical History  Diagnosis Date  . Hypertension   . Hyperlipidemia   . History of atrial tachycardia CONTROLLED W/ LOPRESSOR  . Peripheral vascular disease S/P RIGHT BKA  . Headache(784.0)   . Peripheral neuropathy LEGS AND HANDS  . Arthritis   . S/P BKA (below knee amputation) unilateral RIGHT  . GERD (gastroesophageal reflux disease)   . Anemia   . Blood transfusion   . Diabetes mellitus ORAL AND INSULIN MEDS  . Macular degeneration of both eyes   . Glaucoma   . Retinopathy  due to secondary diabetes mellitus   . Insomnia   . Constipation   . History of bladder cancer TCC AND CIS  . MI (mitral incompetence)   . Coronary artery disease   . Chronic kidney disease   . Cancer     bladder  . Myocardial infarction     Family History  Problem Relation Age of Onset  . Heart failure Mother     died age 83  . Sudden death Father 61  . Heart disease Father   . Hypertension Father   . Heart attack Father   . Diabetes Brother   . Heart attack Brother   . AAA (abdominal aortic aneurysm) Brother     History   Social History  . Marital Status: Divorced    Spouse Name: N/A    Number of Children: N/A  . Years of Education: N/A   Occupational History  . Not on file.   Social History Main Topics  . Smoking status: Former Smoker -- 2.00 packs/day for 17 years    Types: Cigarettes    Quit date: 10/09/1975  . Smokeless tobacco: Never Used  . Alcohol Use: 0.0 oz/week    0 Not specified per week     Comment: RARE  . Drug Use: No  . Sexual Activity: Not on file   Other Topics Concern  . Not on file   Social History Narrative    Past Surgical History  Procedure Laterality Date  . I & d right below knee amputation wound  06-10-2011  . Cysto / resection bladder bx's  04-01-11  &  11-26-10  . Below knee leg amputation  05-02-2007    RIGHT  . Right foot i & d / removal necrotic bone  JUN  &  AUG 2008  . Right great toe amputation  11-05-2006  . Laparoscopic cholecystectomy  12-10-1999  . Back surgery  [place]  . Appendectomy  1963  . Cataract extraction w/ intraocular lens  implant, bilateral    . Cystoscopy with biopsy  10/14/2011    Procedure: CYSTOSCOPY WITH BIOPSY;  Surgeon: Claybon Jabs, MD;  Location: [facility];  Service: Urology;  Laterality: N/A;  BLADDER BIOPSY  . Coronary stent placement    . Abdominal hysterectomy    . Revision urostomy cutaneous    . Revision urostomy cutaneous    . Abdominal angiogram N/A  09/08/2014    Procedure: ABDOMINAL ANGIOGRAM;  Surgeon: Conrad [place], MD;  Location: [facility] CATH LAB;  Service: Cardiovascular;  Laterality: N/A;  . Femoral-popliteal bypass graft Left 09/12/2014    Procedure:  LEFT FEMORAL-POPLITEAL ARTERY BYPASS GRAFT USING NON REVERSE LEFT GREATER SAPHENOUS VEIN;  Surgeon: Rosetta Posner, MD;  Location: [place];  Service: Vascular;  Laterality: Left;  . Amputation Left 09/14/2014    Procedure: LEFT FOOT FIRST RAY AMPUTATION;  Surgeon: Mcarthur Rossetti, MD;  Location: [place];  Service: Orthopedics;  Laterality: Left;     Prescriptions prior to admission  Medication Sig Dispense Refill Last Dose  . acetaminophen (TYLENOL) 500 MG tablet Take 1,000 mg by mouth every 6 (six) hours as needed.    Past Month at Unknown time  . amoxicillin (AMOXIL) 500 MG tablet Take 500 mg by mouth 3 (three) times daily.   09/12/2014 at Unknown time  . aspirin 81 MG chewable tablet Chew 81 mg by mouth at bedtime.    friday1/29  . atorvastatin (LIPITOR) 80 MG tablet Take 1 tablet (80 mg total) by mouth every evening. 90 tablet 2 Past Week at Unknown time  . furosemide (LASIX) 40 MG tablet Take 80 mg by mouth 2 (two) times daily.    1/29  . insulin glargine (LANTUS) 100 UNIT/ML injection Inject 55-60 Units into the skin 2 (two) times daily. 55 units in the morning and 60 units at night   09/11/2014 at Unknown time  . insulin lispro (HUMALOG) 100 UNIT/ML injection Inject 20-50 Units into the skin daily as needed for high blood sugar. PER SLIDING SCALE   09/11/2014 at Unknown time  . isosorbide mononitrate (IMDUR) 30 MG 24 hr tablet Take 1 tablet (30 mg total) by mouth daily. 30 tablet 11 09/12/2014 at Unknown time  . Melatonin 10 MG TABS Take 10 mg by mouth at bedtime.    1/30  . metoprolol succinate (TOPROL-XL) 100 MG 24 hr tablet TAKE 1 TABLET (100 MG TOTAL) BY MOUTH EVERY EVENING. TAKE WITH OR IMMEDIATELY FOLLOWING A MEAL. 90 tablet 1 09/12/2014 at Unknown time  . Multiple Vitamins-Minerals  (PRESERVISION AREDS PO) Take 1 capsule by mouth 2 (two) times daily.   1/29  . nitroGLYCERIN (NITROSTAT) 0.4 MG SL tablet Place 0.4 mg under the tongue every 5 (five) minutes as needed for chest pain.   More than a month at Unknown time  . omeprazole (PRILOSEC) 20 MG capsule Take 20 mg by mouth daily as needed (gurd).    1/29  . polyethylene glycol (MIRALAX / GLYCOLAX) packet Take 17 g by mouth daily. 14 each 0 Past Week at Unknown time  . potassium chloride SA (K-DUR,KLOR-CON) 20 MEQ tablet Take 20 mEq  by mouth daily.    Past Week at Unknown time  . Psyllium (VEGETABLE LAXATIVE PO) Take 4 tablets by mouth at bedtime.   Past Week at Unknown time  . SODIUM BICARBONATE PO Take 20 g by mouth 2 (two) times daily.   Past Week at Unknown time  . clopidogrel (PLAVIX) 75 MG tablet TAKE ONE TABLET BY MOUTH DAILY 30 tablet 0 1/29     ROS: As noted in HPI. All other systems are reviewed and are negative unless otherwise mentioned.   Physical Exam: Blood pressure 132/45, pulse 105, temperature 99.8 F (37.7 C), temperature source Oral, resp. rate 12, height 5\' 4"  (1.626 m), weight 211 lb (95.709 kg), SpO2 98 %. Current Weight  09/12/14 211 lb (95.709 kg)  09/06/14 211 lb (95.709 kg)  09/05/14 211 lb (95.709 kg)    GENERAL:  Well appearing, obese WF in NAD HEENT:  PERRL, EOMI, sclera are clear. Oropharynx is clear. Mucus membranes are dry. NECK:  No jugular venous distention, carotid upstroke brisk and symmetric, no bruits, no thyromegaly or adenopathy LUNGS:  Clear to auscultation bilaterally CHEST:  Unremarkable HEART:  RRR,  PMI not displaced or sustained,S1 and S2 within normal limits, no S3, no S4: no clicks, no rubs, no murmurs ABD:  Soft, nontender. BS +, no masses or bruits. No hepatomegaly, no splenomegaly EXT:  Right BKA. Left foot in surgical dressing.  SKIN:  Warm and dry.  No rashes NEURO:  Alert and oriented x 3. Cranial nerves II through XII intact. PSYCH:  Cognitively  intact    Labs:   Lab Results  Component Value Date   WBC 8.6 09/13/2014   HGB 9.3* 09/13/2014   HCT 29.2* 09/13/2014   MCV 83.2 09/13/2014   PLT 289 09/13/2014    Recent Labs Lab 09/14/14 1914  NA 132*  K 4.3  CL 101  CO2 23  BUN 37*  CREATININE 1.87*  CALCIUM 9.0  PROT 5.5*  BILITOT 0.6  ALKPHOS 329*  ALT 76*  AST 74*  GLUCOSE 209*   Lab Results  Component Value Date   TROPONINI <0.30 01/27/2013   TROPONINI <0.30 01/26/2013   TROPONINI <0.30 01/26/2013   No results found for: CHOL No results found for: HDL No results found for: LDLCALC No results found for: TRIG No results found for: CHOLHDL No results found for: LDLDIRECT  No results found for: PROBNP Lab Results  Component Value Date   TSH 0.494 01/26/2013   No results found for: HGBA1C  Radiology: No results found.  EKG: 08/17/14 sinus tachycardia. Otherwise normal. I have personally reviewed and interpreted this study.   ASSESSMENT AND PLAN:  1. Sinus tachycardia with short runs of PAT. Asymptomatic. Increased cardiac stress with recent surgeries with deconditioning. I think she is also volume depleted with decreased PO intake and increased BUN/creatinine. Will check CBC and TSH. Will hydrate overnight. Continue current metoprolol dose. Hold lasix for now.  2. 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 S/p fem-tib BPG 3. CAD without angina. 4. HTN 5. DM    Signed: Keyundra Fant [place], [place]  09/16/2014, 10:48 AM

## 2014-09-16 NOTE — Progress Notes (Signed)
Report called to Angie, RN on 2west. Pt's VSS, pain controlled with medication, all due meds given. Personal belongings with pt, family at bedside. CCMD and Elink notified, monitor remained on pt for transfer. Pt transferred to 2w08.

## 2014-09-16 NOTE — Progress Notes (Signed)
   [facility]  Subjective  - Sitting up eating breakfast.     Objective 132/45 105 99.8 F (37.7 C) (Oral) 12 98%  Intake/Output Summary (Last 24 hours) at 09/16/14 1009 Last data filed at 09/16/14 0432  Gross per 24 hour  Intake    560 ml  Output   2225 ml  Net  -1665 ml    Doppler signals PT/AT left Foot dressing in place Incisions C/D/I, ABD in groin Heart tachycardia 120-160's Lungs non labored breathing   Assessment/Planning: POD #3 Left femoral to anterior tibial bypass with translocated non-reversed great saphenous vein  Tachycardia on PO metoprolol, will order CBC Will consult cardiology Transfer to 2W later today CIR consult in place   [place], [place] 09/16/2014 10:09 AM --  Laboratory Lab Results: No results for input(s): WBC, HGB, HCT, PLT in the last 72 hours. BMET  Recent Labs  09/14/14 1914  NA 132*  K 4.3  CL 101  CO2 23  GLUCOSE 209*  BUN 37*  CREATININE 1.87*  CALCIUM 9.0    COAG Lab Results  Component Value Date   INR 1.04 09/12/2014   INR 1.03 01/26/2013   INR 0.98 06/10/2011   No results found for: PTT

## 2014-09-16 NOTE — Progress Notes (Signed)
Patient to room 2w08 from 3s. Patient is A/O, VSS, temp 101.2 (tylenol given). POC discussed, patient verbalized understanding. [place], RN

## 2014-09-17 DIAGNOSIS — D649 Anemia, unspecified: Secondary | ICD-10-CM

## 2014-09-17 DIAGNOSIS — I2510 Atherosclerotic heart disease of native coronary artery without angina pectoris: Secondary | ICD-10-CM

## 2014-09-17 DIAGNOSIS — I471 Supraventricular tachycardia: Secondary | ICD-10-CM

## 2014-09-17 DIAGNOSIS — D6489 Other specified anemias: Secondary | ICD-10-CM

## 2014-09-17 DIAGNOSIS — E860 Dehydration: Secondary | ICD-10-CM

## 2014-09-17 DIAGNOSIS — N183 Chronic kidney disease, stage 3 (moderate): Secondary | ICD-10-CM

## 2014-09-17 LAB — GLUCOSE, CAPILLARY
GLUCOSE-CAPILLARY: 232 mg/dL — AB (ref 70–99)
GLUCOSE-CAPILLARY: 204 mg/dL — AB (ref 70–99)
Glucose-Capillary: 287 mg/dL — ABNORMAL HIGH (ref 70–99)
Glucose-Capillary: 204 mg/dL — ABNORMAL HIGH (ref 70–99)
Glucose-Capillary: 188 mg/dL — ABNORMAL HIGH (ref 70–99)
Glucose-Capillary: 157 mg/dL — ABNORMAL HIGH (ref 70–99)
Glucose-Capillary: 158 mg/dL — ABNORMAL HIGH (ref 70–99)
Glucose-Capillary: 131 mg/dL — ABNORMAL HIGH (ref 70–99)

## 2014-09-17 LAB — BASIC METABOLIC PANEL
Anion gap: 7 (ref 5–15)
BUN: 31 mg/dL — ABNORMAL HIGH (ref 6–23)
CO2: 26 mmol/L (ref 19–32)
Calcium: 8.2 mg/dL — ABNORMAL LOW (ref 8.4–10.5)
Chloride: 97 mmol/L (ref 96–112)
Creatinine, Ser: 1.69 mg/dL — ABNORMAL HIGH (ref 0.50–1.10)
GFR calc Af Amer: 34 mL/min — ABNORMAL LOW (ref >90)
GFR calc non Af Amer: 29 mL/min — ABNORMAL LOW (ref >90)
Glucose, Bld: 147 mg/dL — ABNORMAL HIGH (ref 70–99)
POTASSIUM: 3.4 mmol/L — AB (ref 3.5–5.1)
SODIUM: 130 mmol/L — AB (ref 135–145)

## 2014-09-17 LAB — PREPARE RBC (CROSSMATCH)

## 2014-09-17 NOTE — Progress Notes (Signed)
Subjective: Interval History: none.. Comfortable. Better heart rate control  Objective: Vital signs in last 24 hours: Temp:  [98.9 F (37.2 C)-101.2 F (38.4 C)] 98.9 F (37.2 C) (02/06 0628) Pulse Rate:  [92-112] 92 (02/06 0628) Resp:  [15-17] 15 (02/06 0628) BP: (110-130)/(45-52) 119/46 mmHg (02/06 0832) SpO2:  [94 %-98 %] 94 % (02/06 0628)  Intake/Output from previous day: 02/05 0701 - 02/06 0700 In: 402.5 [I.V.:402.5] Out: 2250 [Urine:2250] Intake/Output this shift: Total I/O In: 120 [P.O.:120] Out: -   Surgical incisions healing well. Palpable popliteal pulse. Foot dressing intact.  Lab Results:  Recent Labs  09/16/14 1137 09/16/14 1815  WBC 9.6 8.7  HGB 8.3* 7.4*  HCT 26.2* 23.1*  PLT 342 317   BMET  Recent Labs  09/14/14 1914 09/17/14 0329  NA 132* 130*  K 4.3 3.4*  CL 101 97  CO2 23 26  GLUCOSE 209* 147*  BUN 37* 31*  CREATININE 1.87* 1.69*  CALCIUM 9.0 8.2*    Studies/Results: Mr Foot Left Wo Contrast  09/09/2014   ADDENDUM REPORT: 09/09/2014 08:57  ADDENDUM: The original report was by Dr. Van Clines. The following addendum is by Dr. Van Clines:  I discussed this case with Dr. Quentin Cornwall at 8:50 a.m. on 09/09/2014.  He noted that the patient did have exposed bone along the great toe wound, but also he took a sample of the thick fluid from the region which did demonstrate crystal deposition compatible with gout. It is quite possible that the extensive edema in the first metatarsal and first digit proximal phalanx as well as the medial sesamoid is multifactorial -clearly there is biochemical evidence of gout (although no large marginal erosion is seen on imaging), but the presence of exposed periosteum would also suggest a high likelihood of concomitant osteomyelitis.   Electronically Signed   By: Sherryl Barters M.D.   On: 09/09/2014 08:57   09/09/2014   CLINICAL DATA:  Diabetes. Peripheral vascular disease. Wounds along the right lateral  aspect of the first metatarsal head. Renal insufficiency.  EXAM: MRI OF THE LEFT FOREFOOT WITHOUT CONTRAST  TECHNIQUE: Multiplanar, multisequence MR imaging was performed. No intravenous contrast was administered.  COMPARISON:  08/16/2014  FINDINGS: Abnormal osseous edema in the first metatarsal head and adjacent shaft, medial sesamoid adjacent to the first metatarsal head, the second metatarsal head and metaphysis, the third toe proximal phalanx, the fourth toe proximal phalanx, in the fifth digit metatarsal head and adjacent shaft. If although the bony demineralization on conventional radiography reduces conspicuity, the transverse band of low signal in the fifth metatarsal head is suspicious for a fracture as a potential cause for the osseous edema. Moreover, there is some potential linear low T1 signal in the head of the second metatarsal which could be from a fracture. The proximal phalanx of the great toe is mildly shortened.  Dorsal subcutaneous edema noted in the foot. There is fatty atrophy of the plantar musculature of the foot. Medial to the head of the first metatarsal there is irregularity and disruption of the skin surface potentially representing ulceration. No definite joint effusion.  IMPRESSION: 1. Abnormal osseous edema in the heads of the first, second, and fifth metatarsals; and in the proximal phalanges of the first, third, and fourth digits. Some of this, particularly the involvement in the first digit, is highly suspicious for osteomyelitis. However, the abnormal edema in the head of the fifth metatarsal and probably that in the second metatarsal appears to probably be due to nondisplaced fracture.  2. Subcutaneous edema in the dorsal foot possibly from cellulitis. 3. Muscular atrophy. 4. Skin ulceration medial to the head of the first metatarsal.  Electronically Signed: By: Sherryl Barters M.D. On: 09/06/2014 09:02   Anti-infectives: Anti-infectives    Start     Dose/Rate Route Frequency  Ordered Stop   09/14/14 1745  ceFAZolin (ANCEF) IVPB 2 g/50 mL premix  Status:  Discontinued     2 g100 mL/hr over 30 Minutes Intravenous  Once 09/14/14 1733 09/14/14 1958   09/13/14 0000  cefUROXime (ZINACEF) 1.5 g in dextrose 5 % 50 mL IVPB     1.5 g100 mL/hr over 30 Minutes Intravenous Every 12 hours 09/12/14 1730 09/13/14 1205   09/11/14 1354  cefUROXime (ZINACEF) 1.5 g in dextrose 5 % 50 mL IVPB     1.5 g100 mL/hr over 30 Minutes Intravenous 30 min pre-op 09/11/14 1354 09/12/14 1154      Assessment/Plan: s/p Procedure(s): LEFT FOOT FIRST RAY AMPUTATION (Left) Doing well overall. Appreciate cardiology's assistance. Continued with post operative blood loss anemia with hemoglobin of 7.3. Will transfuse 1 unit packed cells. Discussed this with the patient understands and agrees   LOS: 5 days   [name] 09/17/2014, 12:12 PM

## 2014-09-17 NOTE — Progress Notes (Addendum)
SUBJECTIVE:  Feels weak this am  OBJECTIVE:   Vitals:   Filed Vitals:   09/16/14 1742 09/16/14 2133 09/17/14 0628 09/17/14 0832  BP: 130/52 110/45 115/48 119/46  Pulse: 112 98 92   Temp: 101.2 F (38.4 C) 99.2 F (37.3 C) 98.9 F (37.2 C)   TempSrc: Oral Oral Oral   Resp: 16 17 15    Height:      Weight:      SpO2: 98% 97% 94%    I&O's:   Intake/Output Summary (Last 24 hours) at 09/17/14 0841 Last data filed at 09/17/14 0500  Gross per 24 hour  Intake  402.5 ml  Output   2250 ml  Net -1847.5 ml   TELEMETRY: Reviewed telemetry pt in NSR:     PHYSICAL EXAM General: Well developed, well nourished, in no acute distress Head: Eyes PERRLA, No xanthomas.   Normal cephalic and atramatic  Lungs:   Clear bilaterally to auscultation and percussion. Heart:   HRRR S1 S2 Pulses are 2+ & equal. Abdomen: Bowel sounds are positive, abdomen soft and non-tender without masses  Extremities:   Right BKA.  Left foot in surgical dressing Neuro: Alert and oriented X 3. Psych:  Good affect, responds appropriately   LABS: Basic Metabolic Panel:  Recent Labs  09/14/14 1914 09/17/14 0329  NA 132* 130*  K 4.3 3.4*  CL 101 97  CO2 23 26  GLUCOSE 209* 147*  BUN 37* 31*  CREATININE 1.87* 1.69*  CALCIUM 9.0 8.2*   Liver Function Tests:  Recent Labs  09/14/14 1914  AST 74*  ALT 76*  ALKPHOS 329*  BILITOT 0.6  PROT 5.5*  ALBUMIN 1.8*   No results for input(s): LIPASE, AMYLASE in the last 72 hours. CBC:  Recent Labs  09/16/14 1137 09/16/14 1815  WBC 9.6 8.7  NEUTROABS 6.9  --   HGB 8.3* 7.4*  HCT 26.2* 23.1*  MCV 82.9 82.2  PLT 342 317   Cardiac Enzymes: No results for input(s): CKTOTAL, CKMB, CKMBINDEX, TROPONINI in the last 72 hours. BNP: Invalid input(s): POCBNP D-Dimer: No results for input(s): DDIMER in the last 72 hours. Hemoglobin A1C: No results for input(s): HGBA1C in the last 72 hours. Fasting Lipid Panel: No results for input(s): CHOL, HDL,  LDLCALC, TRIG, CHOLHDL, LDLDIRECT in the last 72 hours. Thyroid Function Tests:  Recent Labs  09/16/14 1137  TSH 1.325   Anemia Panel: No results for input(s): VITAMINB12, FOLATE, FERRITIN, TIBC, IRON, RETICCTPCT in the last 72 hours. Coag Panel:   Lab Results  Component Value Date   INR 1.04 09/12/2014   INR 1.03 01/26/2013   INR 0.98 06/10/2011    RADIOLOGY: Mr Foot Left Wo Contrast  09/09/2014   ADDENDUM REPORT: 09/09/2014 08:57  ADDENDUM: The original report was by Dr. Van Clines. The following addendum is by Dr. Van Clines:  I discussed this case with Dr. Quentin Cornwall at 8:50 a.m. on 09/09/2014.  He noted that the patient did have exposed bone along the great toe wound, but also he took a sample of the thick fluid from the region which did demonstrate crystal deposition compatible with gout. It is quite possible that the extensive edema in the first metatarsal and first digit proximal phalanx as well as the medial sesamoid is multifactorial -clearly there is biochemical evidence of gout (although no large marginal erosion is seen on imaging), but the presence of exposed periosteum would also suggest a high likelihood of concomitant osteomyelitis.   Electronically Signed  By: Sherryl Barters M.D.   On: 09/09/2014 08:57   09/09/2014   CLINICAL DATA:  Diabetes. Peripheral vascular disease. Wounds along the right lateral aspect of the first metatarsal head. Renal insufficiency.  EXAM: MRI OF THE LEFT FOREFOOT WITHOUT CONTRAST  TECHNIQUE: Multiplanar, multisequence MR imaging was performed. No intravenous contrast was administered.  COMPARISON:  08/16/2014  FINDINGS: Abnormal osseous edema in the first metatarsal head and adjacent shaft, medial sesamoid adjacent to the first metatarsal head, the second metatarsal head and metaphysis, the third toe proximal phalanx, the fourth toe proximal phalanx, in the fifth digit metatarsal head and adjacent shaft. If although the bony  demineralization on conventional radiography reduces conspicuity, the transverse band of low signal in the fifth metatarsal head is suspicious for a fracture as a potential cause for the osseous edema. Moreover, there is some potential linear low T1 signal in the head of the second metatarsal which could be from a fracture. The proximal phalanx of the great toe is mildly shortened.  Dorsal subcutaneous edema noted in the foot. There is fatty atrophy of the plantar musculature of the foot. Medial to the head of the first metatarsal there is irregularity and disruption of the skin surface potentially representing ulceration. No definite joint effusion.  IMPRESSION: 1. Abnormal osseous edema in the heads of the first, second, and fifth metatarsals; and in the proximal phalanges of the first, third, and fourth digits. Some of this, particularly the involvement in the first digit, is highly suspicious for osteomyelitis. However, the abnormal edema in the head of the fifth metatarsal and probably that in the second metatarsal appears to probably be due to nondisplaced fracture. 2. Subcutaneous edema in the dorsal foot possibly from cellulitis. 3. Muscular atrophy. 4. Skin ulceration medial to the head of the first metatarsal.  Electronically Signed: By: Sherryl Barters M.D. On: 09/06/2014 09:02    ASSESSMENT AND PLAN:  1. Sinus tachycardia with short runs of PAT. Asymptomatic. Increased cardiac stress with recent surgeries with deconditioning. I think she is also volume depleted with decreased PO intake and increased BUN/creatinine. Creatinine improved after IVF hydration overnight. TSH ok but Hb down to 7.4 and she was febrile last night which could also be driving tachycardia.  HR improved this am.  Continue current metoprolol dose. Continue to hold lasix for now. Consider PRBC transfusion for anemia and probable volume contraction, especially in light of underlying CAD. 2. PAD s/p BKA and amputation of left  first toe. S/p fem-tib BPG 3. CAD without angina. 4. HTN 5. DM    Grae Leathers R, MD  09/17/2014  8:41 AM

## 2014-09-18 LAB — TYPE AND SCREEN
ABO/RH(D): O POS
ANTIBODY SCREEN: NEGATIVE
UNIT DIVISION: 0

## 2014-09-18 LAB — CBC
HEMATOCRIT: 25.1 % — AB (ref 36.0–46.0)
HEMOGLOBIN: 7.9 g/dL — AB (ref 12.0–15.0)
MCH: 25.9 pg — ABNORMAL LOW (ref 26.0–34.0)
MCHC: 31.5 g/dL (ref 30.0–36.0)
MCV: 82.3 fL (ref 78.0–100.0)
Platelets: 283 10*3/uL (ref 150–400)
RBC: 3.05 MIL/uL — ABNORMAL LOW (ref 3.87–5.11)
RDW: 14.7 % (ref 11.5–15.5)
WBC: 5.2 10*3/uL (ref 4.0–10.5)

## 2014-09-18 LAB — BASIC METABOLIC PANEL
Anion gap: 6 (ref 5–15)
BUN: 31 mg/dL — AB (ref 6–23)
CHLORIDE: 102 mmol/L (ref 96–112)
CO2: 24 mmol/L (ref 19–32)
CREATININE: 1.42 mg/dL — AB (ref 0.50–1.10)
Calcium: 8.1 mg/dL — ABNORMAL LOW (ref 8.4–10.5)
GFR calc Af Amer: 41 mL/min — ABNORMAL LOW (ref >90)
GFR calc non Af Amer: 36 mL/min — ABNORMAL LOW (ref >90)
GLUCOSE: 132 mg/dL — AB (ref 70–99)
POTASSIUM: 3.9 mmol/L (ref 3.5–5.1)
Sodium: 132 mmol/L — ABNORMAL LOW (ref 135–145)

## 2014-09-18 LAB — GLUCOSE, CAPILLARY
GLUCOSE-CAPILLARY: 138 mg/dL — AB (ref 70–99)
GLUCOSE-CAPILLARY: 129 mg/dL — AB (ref 70–99)
Glucose-Capillary: 116 mg/dL — ABNORMAL HIGH (ref 70–99)
Glucose-Capillary: 133 mg/dL — ABNORMAL HIGH (ref 70–99)

## 2014-09-18 NOTE — Progress Notes (Signed)
Subjective: Interval History: none.. Comfortable  Objective: Vital signs in last 24 hours: Temp:  [98.3 F (36.8 C)-99 F (37.2 C)] 98.8 F (37.1 C) (02/07 0505) Pulse Rate:  [76-86] 85 (02/07 0505) Resp:  [15-18] 15 (02/07 0505) BP: (98-114)/(37-45) 111/41 mmHg (02/07 0505) SpO2:  [93 %-99 %] 97 % (02/07 0505)  Intake/Output from previous day: 02/06 0701 - 02/07 0700 In: 1355 [P.O.:240; I.V.:750; Blood:365] Out: 750 [Urine:750] Intake/Output this shift:    3+ popliteal graft pulse. Foot well-perfused. Foot dressing intact  Lab Results:  Recent Labs  09/16/14 1815 09/18/14 0430  WBC 8.7 5.2  HGB 7.4* 7.9*  HCT 23.1* 25.1*  PLT 317 283   BMET  Recent Labs  09/17/14 0329 09/18/14 0430  NA 130* 132*  K 3.4* 3.9  CL 97 102  CO2 26 24  GLUCOSE 147* 132*  BUN 31* 31*  CREATININE 1.69* 1.42*  CALCIUM 8.2* 8.1*    Studies/Results: Mr Foot Left Wo Contrast  09/09/2014   ADDENDUM REPORT: 09/09/2014 08:57  ADDENDUM: The original report was by Dr. Van Clines. The following addendum is by Dr. Van Clines:  I discussed this case with Dr. Quentin Cornwall at 8:50 a.m. on 09/09/2014.  He noted that the patient did have exposed bone along the great toe wound, but also he took a sample of the thick fluid from the region which did demonstrate crystal deposition compatible with gout. It is quite possible that the extensive edema in the first metatarsal and first digit proximal phalanx as well as the medial sesamoid is multifactorial -clearly there is biochemical evidence of gout (although no large marginal erosion is seen on imaging), but the presence of exposed periosteum would also suggest a high likelihood of concomitant osteomyelitis.   Electronically Signed   By: Sherryl Barters M.D.   On: 09/09/2014 08:57   09/09/2014   CLINICAL DATA:  Diabetes. Peripheral vascular disease. Wounds along the right lateral aspect of the first metatarsal head. Renal insufficiency.  EXAM: MRI  OF THE LEFT FOREFOOT WITHOUT CONTRAST  TECHNIQUE: Multiplanar, multisequence MR imaging was performed. No intravenous contrast was administered.  COMPARISON:  08/16/2014  FINDINGS: Abnormal osseous edema in the first metatarsal head and adjacent shaft, medial sesamoid adjacent to the first metatarsal head, the second metatarsal head and metaphysis, the third toe proximal phalanx, the fourth toe proximal phalanx, in the fifth digit metatarsal head and adjacent shaft. If although the bony demineralization on conventional radiography reduces conspicuity, the transverse band of low signal in the fifth metatarsal head is suspicious for a fracture as a potential cause for the osseous edema. Moreover, there is some potential linear low T1 signal in the head of the second metatarsal which could be from a fracture. The proximal phalanx of the great toe is mildly shortened.  Dorsal subcutaneous edema noted in the foot. There is fatty atrophy of the plantar musculature of the foot. Medial to the head of the first metatarsal there is irregularity and disruption of the skin surface potentially representing ulceration. No definite joint effusion.  IMPRESSION: 1. Abnormal osseous edema in the heads of the first, second, and fifth metatarsals; and in the proximal phalanges of the first, third, and fourth digits. Some of this, particularly the involvement in the first digit, is highly suspicious for osteomyelitis. However, the abnormal edema in the head of the fifth metatarsal and probably that in the second metatarsal appears to probably be due to nondisplaced fracture. 2. Subcutaneous edema in the dorsal foot possibly from  cellulitis. 3. Muscular atrophy. 4. Skin ulceration medial to the head of the first metatarsal.  Electronically Signed: By: Sherryl Barters M.D. On: 09/06/2014 09:02   Anti-infectives: Anti-infectives    Start     Dose/Rate Route Frequency Ordered Stop   09/14/14 1745  ceFAZolin (ANCEF) IVPB 2 g/50 mL  premix  Status:  Discontinued     2 g100 mL/hr over 30 Minutes Intravenous  Once 09/14/14 1733 09/14/14 1958   09/13/14 0000  cefUROXime (ZINACEF) 1.5 g in dextrose 5 % 50 mL IVPB     1.5 g100 mL/hr over 30 Minutes Intravenous Every 12 hours 09/12/14 1730 09/13/14 1205   09/11/14 1354  cefUROXime (ZINACEF) 1.5 g in dextrose 5 % 50 mL IVPB     1.5 g100 mL/hr over 30 Minutes Intravenous 30 min pre-op 09/11/14 1354 09/12/14 1154      Assessment/Plan: s/p Procedure(s): LEFT FOOT FIRST RAY AMPUTATION (Left) Stable overall. Anemia improved with 1 unit transfusion yesterday. Continue to mobilize.   LOS: 6 days   Saleen Peden 09/18/2014, 9:23 AM

## 2014-09-18 NOTE — Clinical Social Work Psychosocial (Signed)
Clinical Social Work Department BRIEF PSYCHOSOCIAL ASSESSMENT 09/18/2014  Patient:  [name], [name]     Account Number:  [id number]     Admit date:  09/12/2014  Clinical Social Worker:  Lovey Newcomer  Date/Time:  09/18/2014 02:01 PM  Referred by:  Physician  Date Referred:  09/18/2014 Referred for  SNF Placement   Other Referral:   NA   Interview type:  Patient Other interview type:   Patient alert and oriented at time of assessment.    PSYCHOSOCIAL DATA Living Status:  FAMILY Admitted from facility:   Level of care:   Primary support name:  [name] Primary support relationship to patient:  CHILD, ADULT Degree of support available:   Support is fair.    CURRENT CONCERNS Current Concerns  Post-Acute Placement   Other Concerns:   NA    SOCIAL WORK ASSESSMENT / PLAN CSW met with patient at bedside to complete assessment. Patient sitting at EOB and appears to be in a good mood. CSW explained reason for visit (CIR VS SNF placement) and patient willing to engage with CSW. Patient reports that she was admitted from home where she lives with her daughter who is disabled. The patient shares that her daughter is able to take care of herself but is very limited in her abilities to care for the patient at home as she is wheel chair bound due to a past hip fracture. The patient states that prior to this admission she and her daughter were managing fine at home.    CSW inquired about patient's understanding of the reason she was admitted to the hospital. Patient was able to share that she was admitted for a non-healing wound she has been having trouble with "for months" now. The patient shared the extensive interventions that have been tried to help her left foot. Patient states, "I just want to get well, I need some healing time." Patient's understanding of the cause of the wounds is her poor circulation, but states she had a recent procedure done that should help with this,  and this makes her feel better about her ability to heel and hopefully walk again.    Patient would prefer to DC to CIR if able, but will consider SNF as a backup option. CSW explained SNF search/placement process and answered patient's questions. Patient does not report any SNF preference. CSW will followup with available bed offers.   Assessment/plan status:  Psychosocial Support/Ongoing Assessment of Needs Other assessment/ plan:   Complete Fl2, Fax, PASRR   Information/referral to community resources:   CSW contact information and SNF list given.    PATIENT'S/FAMILY'S RESPONSE TO PLAN OF CARE: Patient agreeable to SNF placement as backup to CIR admission. Patient was appropriately engaged in assessment, shows good insight into her diagnosis/prognosis, and was appreciative of CSW visit. Report will be left for weekday CSW.       [name] MSW, [place], [place], [phone]

## 2014-09-18 NOTE — Clinical Social Work Placement (Signed)
Clinical Social Work Department CLINICAL SOCIAL WORK PLACEMENT NOTE 09/18/2014  Patient:  [name], [name]  Account Number:  [id number] Admit date:  09/12/2014  Clinical Social Worker:  Kemper Durie, [place]  Date/time:  09/18/2014 03:40 PM  Clinical Social Work is seeking post-discharge placement for this patient at the following level of care:   SKILLED NURSING   (*CSW will update this form in Epic as items are completed)   09/18/2014  Patient/family provided with [facility] Department of Clinical Social Work's list of facilities offering this level of care within the geographic area requested by the patient (or if unable, by the patient's family).  09/18/2014  Patient/family informed of their freedom to choose among providers that offer the needed level of care, that participate in Medicare, Medicaid or managed care program needed by the patient, have an available bed and are willing to accept the patient.  09/18/2014  Patient/family informed of [facility]' ownership interest in [facility], as well as of the fact that they are under no obligation to receive care at this facility.  PASARR submitted to EDS on 09/18/2014 PASARR number received on 09/18/2014  FL2 transmitted to all facilities in geographic area requested by pt/family on  09/18/2014 FL2 transmitted to all facilities within larger geographic area on   Patient informed that his/her managed care company has contracts with or will negotiate with  certain facilities, including the following:     Patient/family informed of bed offers received:   Patient chooses bed at  Physician recommends and patient chooses bed at    Patient to be transferred to  on   Patient to be transferred to facility by  Patient and family notified of transfer on  Name of family member notified:    The following physician request were entered in Epic:   Additional Comments:    Liz Beach MSW, [place], [place],  [phone]

## 2014-09-18 NOTE — Progress Notes (Signed)
Patient Name: [name] Date of Encounter: 09/18/2014     Active Problems:   CAD (coronary artery disease)   Sinus tachycardia   CKD (chronic kidney disease) stage 3, GFR 30-59 ml/min   Dehydration   Non-healing wound of lower extremity   Anemia    SUBJECTIVE  Some back/neck pain and foot pain. But otherwise no complaints.   CURRENT MEDS . aspirin  81 mg Oral QHS  . atorvastatin  80 mg Oral QPM  . clopidogrel  75 mg Oral Daily  . docusate sodium  100 mg Oral Daily  . Influenza vac split quadrivalent PF  0.5 mL Intramuscular Tomorrow-1000  . insulin aspart  0-5 Units Subcutaneous QHS  . insulin aspart  0-9 Units Subcutaneous TID WC  . insulin glargine  55 Units Subcutaneous Q breakfast  . insulin glargine  60 Units Subcutaneous QHS  . isosorbide mononitrate  30 mg Oral Daily  . metoprolol succinate  100 mg Oral Daily  . pantoprazole  40 mg Oral Daily  . polyethylene glycol  17 g Oral Daily  . potassium chloride SA  20 mEq Oral Daily  . sodium bicarbonate  1,300 mg Oral BID    OBJECTIVE  Filed Vitals:   09/17/14 1611 09/17/14 1658 09/17/14 2015 09/18/14 0505  BP: 98/37 102/45 114/42 111/41  Pulse: 82 76 76 85  Temp: 98.3 F (36.8 C) 98.8 F (37.1 C) 98.4 F (36.9 C) 98.8 F (37.1 C)  TempSrc: Oral Oral Oral Oral  Resp: 17 16 18 15   Height:      Weight:      SpO2: 93% 95% 99% 97%    Intake/Output Summary (Last 24 hours) at 09/18/14 0807 Last data filed at 09/18/14 0700  Gross per 24 hour  Intake   1355 ml  Output    750 ml  Net    605 ml   Filed Weights   09/12/14 0941  Weight: 211 lb (95.709 kg)    PHYSICAL EXAM  General: Well developed, well nourished, in no acute distress Head: Eyes PERRLA, No xanthomas. Normal cephalic and atramatic Lungs: Clear bilaterally to auscultation and percussion. Heart: HRRR S1 S2 Pulses are 2+ & equal. Abdomen: Bowel sounds are positive, abdomen soft and non-tender without masses  Extremities:  Right BKA. Left foot in surgical dressing Neuro: Alert and oriented X 3. Psych: Good affect, responds appropriately  Accessory Clinical Findings  CBC  Recent Labs  09/16/14 1137 09/16/14 1815 09/18/14 0430  WBC 9.6 8.7 5.2  NEUTROABS 6.9  --   --   HGB 8.3* 7.4* 7.9*  HCT 26.2* 23.1* 25.1*  MCV 82.9 82.2 82.3  PLT 342 317 681   Basic Metabolic Panel  Recent Labs  09/17/14 0329 09/18/14 0430  NA 130* 132*  K 3.4* 3.9  CL 97 102  CO2 26 24  GLUCOSE 147* 132*  BUN 31* 31*  CREATININE 1.69* 1.42*  CALCIUM 8.2* 8.1*   Thyroid Function Tests  Recent Labs  09/16/14 1137  TSH 1.325    TELE  NSR   Radiology/Studies  Mr Foot Left Wo Contrast  09/09/2014   ADDENDUM REPORT: 09/09/2014 08:57  ADDENDUM: The original report was by Dr. Van Clines. The following addendum is by Dr. Van Clines:  I discussed this case with Dr. Quentin Cornwall at 8:50 a.m. on 09/09/2014.  He noted that the patient did have exposed bone along the great toe wound, but also he took a sample of the thick fluid from the  region which did demonstrate crystal deposition compatible with gout. It is quite possible that the extensive edema in the first metatarsal and first digit proximal phalanx as well as the medial sesamoid is multifactorial -clearly there is biochemical evidence of gout (although no large marginal erosion is seen on imaging), but the presence of exposed periosteum would also suggest a high likelihood of concomitant osteomyelitis.   Electronically Signed   By: Sherryl Barters M.D.   On: 09/09/2014 08:57   09/09/2014   CLINICAL DATA:  Diabetes. Peripheral vascular disease. Wounds along the right lateral aspect of the first metatarsal head. Renal insufficiency.  EXAM: MRI OF THE LEFT FOREFOOT WITHOUT CONTRAST  TECHNIQUE: Multiplanar, multisequence MR imaging was performed. No intravenous contrast was administered.  COMPARISON:  08/16/2014  FINDINGS: Abnormal osseous edema in the first  metatarsal head and adjacent shaft, medial sesamoid adjacent to the first metatarsal head, the second metatarsal head and metaphysis, the third toe proximal phalanx, the fourth toe proximal phalanx, in the fifth digit metatarsal head and adjacent shaft. If although the bony demineralization on conventional radiography reduces conspicuity, the transverse band of low signal in the fifth metatarsal head is suspicious for a fracture as a potential cause for the osseous edema. Moreover, there is some potential linear low T1 signal in the head of the second metatarsal which could be from a fracture. The proximal phalanx of the great toe is mildly shortened.  Dorsal subcutaneous edema noted in the foot. There is fatty atrophy of the plantar musculature of the foot. Medial to the head of the first metatarsal there is irregularity and disruption of the skin surface potentially representing ulceration. No definite joint effusion.  IMPRESSION: 1. Abnormal osseous edema in the heads of the first, second, and fifth metatarsals; and in the proximal phalanges of the first, third, and fourth digits. Some of this, particularly the involvement in the first digit, is highly suspicious for osteomyelitis. However, the abnormal edema in the head of the fifth metatarsal and probably that in the second metatarsal appears to probably be due to nondisplaced fracture. 2. Subcutaneous edema in the dorsal foot possibly from cellulitis. 3. Muscular atrophy. 4. Skin ulceration medial to the head of the first metatarsal.  Electronically Signed: By: Sherryl Barters M.D. On: 09/06/2014 09:02    ASSESSMENT AND PLAN  1. Sinus tachycardia with short runs of PAT. Asymptomatic. Now resolved. -- Likley from increased cardiac stress with recent surgeries with deconditioning. Additionally, she was hypovolemic with decreased PO intake and increased BUN/creatinine, anemic and febrile.  -- Creatinine improved after IVF hydration and blood. 1.87-->  1.69--> 1.42. Continue to hold lasix for now. -- TSH ok  -- Hb down to 7.4. S/p 1Unit PRBC transfusion for anemia and probable volume contraction, especially in light of underlying CAD. H/H 7.9/25.1 today -- Continue current metoprolol dose.   2. 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 S/p fem-tib BPG  3. CAD without angina.   4. HTN  5. DM   Signed, Eileen Stanford PA-C  Pager [phone]  I have personally seen and examined the patient and agree with the note as outlined by Angelena Form, PA.  Hg improved after transfusion.  Creatinine continues to improve with holding Lasix and IVF hydration.  Continue BB/ASA/statin/Plavix/nitrates for CAD.  She has not had any angina.    Signed: Fransico Him, MD [facility] 08/18/2014

## 2014-09-19 LAB — CBC
HEMATOCRIT: 29.9 % — AB (ref 36.0–46.0)
HEMOGLOBIN: 9.4 g/dL — AB (ref 12.0–15.0)
MCH: 26.7 pg (ref 26.0–34.0)
MCHC: 31.4 g/dL (ref 30.0–36.0)
MCV: 84.9 fL (ref 78.0–100.0)
PLATELETS: 388 10*3/uL (ref 150–400)
RBC: 3.52 MIL/uL — ABNORMAL LOW (ref 3.87–5.11)
RDW: 15 % (ref 11.5–15.5)
WBC: 7.3 10*3/uL (ref 4.0–10.5)

## 2014-09-19 LAB — BASIC METABOLIC PANEL
Anion gap: 9 (ref 5–15)
BUN: 27 mg/dL — ABNORMAL HIGH (ref 6–23)
CHLORIDE: 100 mmol/L (ref 96–112)
CO2: 25 mmol/L (ref 19–32)
Calcium: 8.6 mg/dL (ref 8.4–10.5)
Creatinine, Ser: 1.3 mg/dL — ABNORMAL HIGH (ref 0.50–1.10)
GFR calc Af Amer: 46 mL/min — ABNORMAL LOW (ref >90)
GFR, EST NON AFRICAN AMERICAN: 40 mL/min — AB (ref >90)
Glucose, Bld: 165 mg/dL — ABNORMAL HIGH (ref 70–99)
Potassium: 4.5 mmol/L (ref 3.5–5.1)
SODIUM: 134 mmol/L — AB (ref 135–145)

## 2014-09-19 LAB — GLUCOSE, CAPILLARY: Glucose-Capillary: 84 mg/dL (ref 70–99)

## 2014-09-19 NOTE — Progress Notes (Signed)
Rehab admissions - I have not heard back from insurance case manager yet.  I will update all once I know decision regarding potential inpatient rehab admission.  Call me for questions.  #[phone]

## 2014-09-19 NOTE — Progress Notes (Signed)
Patient Name: [name] Date of Encounter: 09/19/2014     Active Problems:   CAD (coronary artery disease)   Sinus tachycardia   CKD (chronic kidney disease) stage 3, GFR 30-59 ml/min   Dehydration   Non-healing wound of lower extremity   Anemia    SUBJECTIVE  Feeling "like she is going to live today." Much improved. Sitting up. Feeling well. No complaints  CURRENT MEDS . aspirin  81 mg Oral QHS  . atorvastatin  80 mg Oral QPM  . clopidogrel  75 mg Oral Daily  . docusate sodium  100 mg Oral Daily  . Influenza vac split quadrivalent PF  0.5 mL Intramuscular Tomorrow-1000  . insulin aspart  0-5 Units Subcutaneous QHS  . insulin aspart  0-9 Units Subcutaneous TID WC  . insulin glargine  55 Units Subcutaneous Q breakfast  . insulin glargine  60 Units Subcutaneous QHS  . isosorbide mononitrate  30 mg Oral Daily  . metoprolol succinate  100 mg Oral Daily  . pantoprazole  40 mg Oral Daily  . polyethylene glycol  17 g Oral Daily  . potassium chloride SA  20 mEq Oral Daily  . sodium bicarbonate  1,300 mg Oral BID    OBJECTIVE  Filed Vitals:   09/19/14 0500 09/19/14 0805 09/19/14 0910 09/19/14 0945  BP: 125/46 136/47  134/47  Pulse: 77 92  92  Temp: 98.2 F (36.8 C)     TempSrc: Oral     Resp: 18     Height:      Weight:      SpO2: 96%  99%     Intake/Output Summary (Last 24 hours) at 09/19/14 1006 Last data filed at 09/19/14 0648  Gross per 24 hour  Intake    180 ml  Output   2200 ml  Net  -2020 ml   Filed Weights   09/12/14 0941  Weight: 211 lb (95.709 kg)    PHYSICAL EXAM  General: Well developed, well nourished, in no acute distress Head: Eyes PERRLA, No xanthomas. Normal cephalic and atramatic Lungs: Clear bilaterally to auscultation and percussion. Heart: HRRR S1 S2 Pulses are 2+ & equal. Abdomen: Bowel sounds are positive, abdomen soft and non-tender without masses  Extremities: Right BKA. Left foot in surgical  dressing Neuro: Alert and oriented X 3. Psych: Good affect, responds appropriately  Accessory Clinical Findings  CBC  Recent Labs  09/16/14 1137 09/16/14 1815 09/18/14 0430  WBC 9.6 8.7 5.2  NEUTROABS 6.9  --   --   HGB 8.3* 7.4* 7.9*  HCT 26.2* 23.1* 25.1*  MCV 82.9 82.2 82.3  PLT 342 317 094   Basic Metabolic Panel  Recent Labs  09/17/14 0329 09/18/14 0430  NA 130* 132*  K 3.4* 3.9  CL 97 102  CO2 26 24  GLUCOSE 147* 132*  BUN 31* 31*  CREATININE 1.69* 1.42*  CALCIUM 8.2* 8.1*   Thyroid Function Tests  Recent Labs  09/16/14 1137  TSH 1.325    TELE  NSR   Radiology/Studies  Mr Foot Left Wo Contrast  09/09/2014   ADDENDUM REPORT: 09/09/2014 08:57  ADDENDUM: The original report was by Dr. Van Clines. The following addendum is by Dr. Van Clines:  I discussed this case with Dr. Quentin Cornwall at 8:50 a.m. on 09/09/2014.  He noted that the patient did have exposed bone along the great toe wound, but also he took a sample of the thick fluid from the region which did demonstrate crystal deposition  compatible with gout. It is quite possible that the extensive edema in the first metatarsal and first digit proximal phalanx as well as the medial sesamoid is multifactorial -clearly there is biochemical evidence of gout (although no large marginal erosion is seen on imaging), but the presence of exposed periosteum would also suggest a high likelihood of concomitant osteomyelitis.   Electronically Signed   By: Sherryl Barters M.D.   On: 09/09/2014 08:57   09/09/2014   CLINICAL DATA:  Diabetes. Peripheral vascular disease. Wounds along the right lateral aspect of the first metatarsal head. Renal insufficiency.  EXAM: MRI OF THE LEFT FOREFOOT WITHOUT CONTRAST  TECHNIQUE: Multiplanar, multisequence MR imaging was performed. No intravenous contrast was administered.  COMPARISON:  08/16/2014  FINDINGS: Abnormal osseous edema in the first metatarsal head and adjacent shaft,  medial sesamoid adjacent to the first metatarsal head, the second metatarsal head and metaphysis, the third toe proximal phalanx, the fourth toe proximal phalanx, in the fifth digit metatarsal head and adjacent shaft. If although the bony demineralization on conventional radiography reduces conspicuity, the transverse band of low signal in the fifth metatarsal head is suspicious for a fracture as a potential cause for the osseous edema. Moreover, there is some potential linear low T1 signal in the head of the second metatarsal which could be from a fracture. The proximal phalanx of the great toe is mildly shortened.  Dorsal subcutaneous edema noted in the foot. There is fatty atrophy of the plantar musculature of the foot. Medial to the head of the first metatarsal there is irregularity and disruption of the skin surface potentially representing ulceration. No definite joint effusion.  IMPRESSION: 1. Abnormal osseous edema in the heads of the first, second, and fifth metatarsals; and in the proximal phalanges of the first, third, and fourth digits. Some of this, particularly the involvement in the first digit, is highly suspicious for osteomyelitis. However, the abnormal edema in the head of the fifth metatarsal and probably that in the second metatarsal appears to probably be due to nondisplaced fracture. 2. Subcutaneous edema in the dorsal foot possibly from cellulitis. 3. Muscular atrophy. 4. Skin ulceration medial to the head of the first metatarsal.  Electronically Signed: By: Sherryl Barters M.D. On: 09/06/2014 09:02    ASSESSMENT AND PLAN  [name] is a 74 y.o. 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 She was admitted on 09/12/14 for Left femoral to anterior tibial bypass with translocated non-reversed great saphenous vein by Dr. Donnetta Hutching.  Cardiology was  consulted for sinus tach with short runs of PAT.   1. Sinus tachycardia with short runs of PAT. Asymptomatic. Now resolved. -- Likley from increased cardiac stress with recent surgeries with deconditioning. Additionally, she was hypovolemic with decreased PO intake and increased BUN/creatinine, anemic and febrile.  -- Creatinine improved after IVF hydration and blood. 1.87--> 1.69--> 1.42. Continue to hold lasix for now. BMET pending this AM -- TSH ok  -- Hb down to 7.4. S/p 1Unit PRBC transfusion for anemia and probable volume contraction, especially in light of underlying CAD. H/H pending today  -- Continue current metoprolol dose.   2. 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 S/p fem-tib BPG  3. CAD without angina.  --  Continue BB/ASA/statin/Plavix/nitrates for CAD.   4. HTN-well controlled.  5. DM   6. Anemia- Hg improved  after transfusion. H/H pending today  Signed, Eileen Stanford PA-C  Pager [phone]  I have seen and examined the patient along with Angelena Form R PA-C.  I have reviewed the chart, notes and new data.  I agree with PA's note.  PLAN: No further arrhythmia. Please call us back as needed  Sanda Klein, MD, [facility] [place] [phone] 09/19/2014, 10:56 AM

## 2014-09-19 NOTE — Progress Notes (Signed)
Medicare Important Message given? YES  (If response is "NO", the following Medicare IM given date fields will be blank)  Date Medicare IM given: 09/19/14 Medicare IM given by:  Micaila Ziemba  

## 2014-09-19 NOTE — Care Management Note (Signed)
    Page 1 of 1   09/19/2014     4:24:23 PM CARE MANAGEMENT NOTE 09/19/2014  Patient:  [name], [name]   Account Number:  [id number]  Date Initiated:  09/13/2014  Documentation initiated by:  Marvetta Gibbons  Subjective/Objective Assessment:   Pt admitted s/p fempop bypass graft     Action/Plan:   PTA pt lived at home with help of friend- PT/OT evals pending- NCM to follow for recommendations   Anticipated DC Date:  09/20/2014   Anticipated DC Plan:  [place]  In-house referral  Clinical Social Worker      DC Planning Services  CM consult      Choice offered to / List presented to:             Status of service:  In process, will continue to follow Medicare Important Message given?  YES (If response is "NO", the following Medicare IM given date fields will be blank) Date Medicare IM given:  09/19/2014 Medicare IM given by:  Marvetta Gibbons Date Additional Medicare IM given:   Additional Medicare IM given by:    Discharge Disposition:    Per UR Regulation:  Reviewed for med. necessity/level of care/duration of stay  If discussed at [place] of Stay Meetings, dates discussed:   09/20/2014    Comments:  09/19/14- 1600- Marvetta Gibbons RN, BSN [place] from [place] with CIR- pt's insurance has denied CIR- pt however agreeable to STSNF as secondary option- CSW to f/u with pt regarding placement options.  09/16/14- 1000-Myna Freimark RN, bSN [phone] CIR consulted

## 2014-09-19 NOTE — Progress Notes (Addendum)
  [facility] Progress Note  09/19/2014 7:39 AM 5 Days Post-Op  Subjective:  Left leg "aching" improved from yesterday. Denies CP, SOB.   Tmax 99.1 BP sys 110s-120s HR max 95 02 98% RA  Filed Vitals:   09/19/14 0500  BP: 125/46  Pulse: 77  Temp: 98.2 F (36.8 C)  Resp: 18    Physical Exam: Incisions:  Left groin and left medial leg incisions clean dry and intact.  Extremities:  Palpable left popliteal graft pulse. Foot is warm and well perfused. Dressing clean and intact.  Cardiac: RRR Lungs: clear to auscultation  CBC    Component Value Date/Time   WBC 5.2 09/18/2014 0430   RBC 3.05* 09/18/2014 0430   HGB 7.9* 09/18/2014 0430   HCT 25.1* 09/18/2014 0430   PLT 283 09/18/2014 0430   MCV 82.3 09/18/2014 0430   MCH 25.9* 09/18/2014 0430   MCHC 31.5 09/18/2014 0430   RDW 14.7 09/18/2014 0430   LYMPHSABS 1.3 09/16/2014 1137   MONOABS 0.8 09/16/2014 1137   EOSABS 0.5 09/16/2014 1137   BASOSABS 0.0 09/16/2014 1137    BMET    Component Value Date/Time   NA 132* 09/18/2014 0430   K 3.9 09/18/2014 0430   CL 102 09/18/2014 0430   CO2 24 09/18/2014 0430   GLUCOSE 132* 09/18/2014 0430   BUN 31* 09/18/2014 0430   CREATININE 1.42* 09/18/2014 0430   CALCIUM 8.1* 09/18/2014 0430   GFRNONAA 36* 09/18/2014 0430   GFRAA 41* 09/18/2014 0430    INR    Component Value Date/Time   INR 1.04 09/12/2014 0949     Intake/Output Summary (Last 24 hours) at 09/19/14 0739 Last data filed at 09/19/14 0648  Gross per 24 hour  Intake    540 ml  Output   2200 ml  Net  -1660 ml     Assessment:  74 y.o. female is s/p: Left femoral to anterior tibial bypass with translocated non-reversed great saphenous vein 7 Days Post-Op; s/p left first ray resection/amputation 5 Days Post-Op  Plan: -Incisions intact. Foot well perfused. Meticulous hygiene to left groin. Keep dry with tid abd pads.  -Sinus tach: resolved.  Appreciate cardiology following. Creatinine has  improved to 1.42 yesterday. Lasix per cardiology.  -Hgb yesterday 7.9.  -Continue to mobilize. -CIR to follow-up today.       Virgina Jock, PA-C [facility] Office: [phone] Pager: [phone] 09/19/2014 7:39 AM     I have examined the patient, reviewed and agree with above. Comfortable. Wounds healing from his surgical incisions. Foot well-perfused  Flora Parks, MD 09/19/2014 3:54 PM

## 2014-09-19 NOTE — Progress Notes (Signed)
Occupational Therapy Treatment Patient Details Name: [name] MRN: [id number] DOB: 23-Mar-1941 Today's Date: 09/19/2014    History of present illness pt presents post L Femoral to Anterior Tibial Bypass with L first ray amputation on 09/14/14.  PMHx of R BKA   OT comments  Patient is progressing towards goals, continue plan of care. Patient continues to be motivated to be as independent as possible and is a good candidate for comprehensive rehab on CIR.   Follow Up Recommendations  CIR;Supervision/Assistance - 24 hour    Equipment Recommendations   (TBD next venue of care)    Recommendations for Other Services  None at this time    Precautions / Restrictions Precautions Precautions: Fall Precaution Comments: pt with R BKA and prosthetic LE.   Restrictions Weight Bearing Restrictions: Yes LLE Weight Bearing: Weight bearing as tolerated Other Position/Activity Restrictions: WBAT in forefoot offloading Darco       Mobility Bed Mobility Overal bed mobility: Needs Assistance Bed Mobility: Rolling;Sidelying to Sit Rolling: Min assist Sidelying to sit: Mod assist       General bed mobility comments: cues for sequence with assist to shift hips to EOB and elevate trunk from surface  Transfers Overall transfer level: Needs assistance   Transfers: Sit to/from Stand Sit to Stand: Min assist;From elevated surface         General transfer comment: cues for hand placement, sequence and safety with transfers. Increased time EOB to don prosthesis prior to standing and elevated bed to lock in prosthesis    Balance Overall balance assessment: Needs assistance   Sitting balance-Leahy Scale: Good       Standing balance-Leahy Scale: Fair    ADL General ADL Comments: Patient engaged in PNF stretching exercises in order to increase overall independence in the areas of ADLs and functional mobility/transfers. Patient with increased stiffness throughout body and will benefit from  continued PNF training/exercises.                 Cognition   Behavior During Therapy: WFL for tasks assessed/performed Overall Cognitive Status: Within Functional Limits for tasks assessed Area of Impairment: Problem solving   Problem Solving: Slow processing                   Pertinent Vitals/ Pain       Pain Assessment: 0-10 Pain Score: 3  Pain Location: left groin and LLE Pain Descriptors / Indicators: Sore Pain Intervention(s): Repositioned;Monitored during session;Premedicated before session         Frequency Min 2X/week     Progress Toward Goals  OT Goals(current goals can now be found in the care plan section)  Progress towards OT goals: Progressing toward goals     Plan Discharge plan remains appropriate          Activity Tolerance Patient tolerated treatment well   Patient Left in chair;with call bell/phone within reach     Time: 1002-1029 OT Time Calculation (min): 27 min  Charges: OT General Charges $OT Visit: 1 Procedure OT Treatments $Self Care/Home Management : 8-22 mins $Therapeutic Exercise: 8-22 mins  Makenlee Mckeag , MS, OTR/L, CLT Pager: [phone]  09/19/2014, 10:40 AM

## 2014-09-19 NOTE — Progress Notes (Signed)
Rehab admissions - We have received a denial from [employer] for acute inpatient rehab admission.  Options now will be SNF placement or HH therapies if patient prefers.  I have let the case manager know of denial.  Call me for questions.  #[phone]

## 2014-09-19 NOTE — Progress Notes (Signed)
Physical Therapy Treatment Patient Details Name: [name] MRN: [id number] DOB: [birth year] Today's Date: 09/19/2014    History of Present Illness pt presents post L Femoral to Anterior Tibial Bypass with L first ray amputation on 09/14/14.  PMHx of R BKA    PT Comments    Pt pleasant but limited by pain in LLE groin for mobility. Pt with increased time EOB and assist to don prosthesis prior to OOB. Pt will benefit from premedication for future sessions to increase activity tolerance. Pt encouraged to be OOB daily and educated for Darco shoe wear. Will continue to follow.   Follow Up Recommendations  CIR     Equipment Recommendations       Recommendations for Other Services       Precautions / Restrictions Precautions Precautions: Fall Precaution Comments: pt with R BKA and prosthetic LE.   Restrictions LLE Weight Bearing: Weight bearing as tolerated Other Position/Activity Restrictions: WBAT in forefoot offloading Darco    Mobility  Bed Mobility Overal bed mobility: Needs Assistance Bed Mobility: Rolling;Sidelying to Sit Rolling: Min assist Sidelying to sit: Mod assist       General bed mobility comments: cues for sequence with assist to shift hips to EOB and elevate trunk from surface  Transfers Overall transfer level: Needs assistance   Transfers: Sit to/from Stand Sit to Stand: Min assist;From elevated surface         General transfer comment: cues for hand placement, sequence and safety with transfers. Increased time EOB to don prosthesis prior to standing and elevated bed to lock in prosthesis  Ambulation/Gait Ambulation/Gait assistance: Min assist;+2 safety/equipment Ambulation Distance (Feet): 5 Feet Assistive device: Rolling walker (2 wheeled) Gait Pattern/deviations: Step-to pattern;Wide base of support   Gait velocity interpretation: Below normal speed for age/gender General Gait Details: cues for sequence safety, balance with weight on heel of  darco shoe LLE as well as position in RW since pt tends to stand too close to Rw. Gait limited by pain in LLE    Stairs            Wheelchair Mobility    Modified Rankin (Stroke Patients Only)       Balance Overall balance assessment: Needs assistance   Sitting balance-Leahy Scale: Good       Standing balance-Leahy Scale: Fair                      Cognition Arousal/Alertness: Awake/alert Behavior During Therapy: WFL for tasks assessed/performed Overall Cognitive Status: Impaired/Different from baseline Area of Impairment: Problem solving             Problem Solving: Slow processing      Exercises      General Comments        Pertinent Vitals/Pain Pain Score: 4  Pain Location: left groin at rest, 8/10 with activity Pain Descriptors / Indicators: Aching Pain Intervention(s): Repositioned;RN gave pain meds during session    Home Living                      Prior Function            PT Goals (current goals can now be found in the care plan section) Progress towards PT goals: Progressing toward goals    Frequency       PT Plan Current plan remains appropriate    Co-evaluation             End of Session Equipment Utilized  During Treatment: Gait belt Activity Tolerance: Patient tolerated treatment well Patient left: in chair;with call bell/phone within reach     Time: 0852-0921 PT Time Calculation (min) (ACUTE ONLY): 29 min  Charges:  $Gait Training: 8-22 mins $Therapeutic Activity: 8-22 mins                    G Codes:      Melford Aase Sep 23, 2014, 10:36 AM Elwyn Reach, [place]

## 2014-09-20 ENCOUNTER — Telehealth: Payer: Self-pay | Admitting: Vascular Surgery

## 2014-09-20 DIAGNOSIS — I70262 Atherosclerosis of native arteries of extremities with gangrene, left leg: Secondary | ICD-10-CM | POA: Diagnosis not present

## 2014-09-20 LAB — CBC
HCT: 27.8 % — ABNORMAL LOW (ref 36.0–46.0)
Hemoglobin: 8.7 g/dL — ABNORMAL LOW (ref 12.0–15.0)
MCH: 26.6 pg (ref 26.0–34.0)
MCHC: 31.3 g/dL (ref 30.0–36.0)
MCV: 85 fL (ref 78.0–100.0)
PLATELETS: 362 10*3/uL (ref 150–400)
RBC: 3.27 MIL/uL — ABNORMAL LOW (ref 3.87–5.11)
RDW: 15.1 % (ref 11.5–15.5)
WBC: 6.2 10*3/uL (ref 4.0–10.5)

## 2014-09-20 LAB — GLUCOSE, CAPILLARY
GLUCOSE-CAPILLARY: 124 mg/dL — AB (ref 70–99)
Glucose-Capillary: 164 mg/dL — ABNORMAL HIGH (ref 70–99)
Glucose-Capillary: 128 mg/dL — ABNORMAL HIGH (ref 70–99)
Glucose-Capillary: 249 mg/dL — ABNORMAL HIGH (ref 70–99)

## 2014-09-20 LAB — BASIC METABOLIC PANEL
Anion gap: 6 (ref 5–15)
BUN: 28 mg/dL — AB (ref 6–23)
CHLORIDE: 102 mmol/L (ref 96–112)
CO2: 27 mmol/L (ref 19–32)
Calcium: 8.7 mg/dL (ref 8.4–10.5)
Creatinine, Ser: 1.13 mg/dL — ABNORMAL HIGH (ref 0.50–1.10)
GFR calc Af Amer: 54 mL/min — ABNORMAL LOW (ref >90)
GFR, EST NON AFRICAN AMERICAN: 47 mL/min — AB (ref >90)
Glucose, Bld: 129 mg/dL — ABNORMAL HIGH (ref 70–99)
POTASSIUM: 4.1 mmol/L (ref 3.5–5.1)
Sodium: 135 mmol/L (ref 135–145)

## 2014-09-20 MED ORDER — OXYCODONE HCL 5 MG PO TABS: 5.0000 mg | ORAL_TABLET | ORAL | Status: DC | PRN

## 2014-09-20 NOTE — Clinical Social Work Psychosocial (Signed)
[facility] [facility].  CSW attempted to contact pt daughter- left message.  CSW will continue to follow.  Domenica Reamer, [place] Social Worker [phone]

## 2014-09-20 NOTE — Telephone Encounter (Signed)
No answer, no vm- unable to reach. Mailed letter, dpm

## 2014-09-20 NOTE — Telephone Encounter (Signed)
-----   Message from Gabriel Earing, [place] sent at 09/20/2014 10:39 AM EST ----- This pt did have ABI's so not needed at the office!  Thanks

## 2014-09-20 NOTE — Progress Notes (Signed)
Physical Therapy Treatment Patient Details Name: [name] MRN: [id number] DOB: 04-22-1941 Today's Date: 09/20/2014    History of Present Illness pt presents post L Femoral to Anterior Tibial Bypass with L first ray amputation on 09/14/14.  PMHx of R BKA    PT Comments    Pt was seen for better quality gait today and PT was told by pt that CIR cannot take her.  SNF is recommended as pt is going to need more independent function to go home and is facing a remake on her RLE prosthesis, at 20+ ply socks.  Her plan is to go to SNF after having time to consider her options.  Follow Up Recommendations  SNF;Supervision/Assistance - 24 hour (CIR is full)     Equipment Recommendations  None recommended by PT    Recommendations for Other Services Rehab consult     Precautions / Restrictions Precautions Precautions: Fall Precaution Comments: pt with R BKA and prosthetic LE.   Required Braces or Orthoses: Other Brace/Splint (Darco shoe LLE) Restrictions Weight Bearing Restrictions: Yes LLE Weight Bearing: Weight bearing as tolerated Other Position/Activity Restrictions: WBAT in forefoot offloading Darco    Mobility  Bed Mobility               General bed mobility comments: up when PT entered  Transfers Overall transfer level: Needs assistance Equipment used: Rolling walker (2 wheeled) Transfers: Sit to/from [facility] Sit to Stand: Min assist;Mod assist (lower surface) Stand pivot transfers: Min assist       General transfer comment: reminders for sequence and safety  Ambulation/Gait Ambulation/Gait assistance: Min assist Ambulation Distance (Feet): 25 Feet Assistive device: Rolling walker (2 wheeled) Gait Pattern/deviations: Step-through pattern;Step-to pattern;Decreased stride length;Decreased stance time - left;Decreased dorsiflexion - left;Decreased weight shift to left;Narrow base of support Gait velocity: reduced Gait velocity interpretation:  Below normal speed for age/gender General Gait Details: Pt is slow and methodical and very much more in control than last note, with careful presentation to PT   Stairs            Wheelchair Mobility    Modified Rankin (Stroke Patients Only)       Balance Overall balance assessment: Needs assistance Sitting-balance support: Feet supported Sitting balance-Leahy Scale: Good   Postural control: Posterior lean Standing balance support: Bilateral upper extremity supported Standing balance-Leahy Scale: Fair                      Cognition Arousal/Alertness: Awake/alert Behavior During Therapy: WFL for tasks assessed/performed Overall Cognitive Status: Within Functional Limits for tasks assessed                      Exercises      General Comments General comments (skin integrity, edema, etc.): Pt was also seen for 1156 to 1231      Pertinent Vitals/Pain Pain Assessment: 0-10 Pain Score: 4  Pain Location: L foot Pain Intervention(s): Limited activity within patient's tolerance;Monitored during session;Premedicated before session;Repositioned    Home Living                      Prior Function            PT Goals (current goals can now be found in the care plan section) Acute Rehab PT Goals Patient Stated Goal: Home and independent Progress towards PT goals: Progressing toward goals    Frequency  Min 3X/week    PT Plan Current plan  remains appropriate    Co-evaluation             End of Session Equipment Utilized During Treatment: Gait belt Activity Tolerance: Patient tolerated treatment well Patient left: in chair;with call bell/phone within reach     Time: 1114-1129 PT Time Calculation (min) (ACUTE ONLY): 15 min  Charges:  $Gait Training: 8-22 mins $Therapeutic Activity: 23-37 mins                    G Codes:      Ramond Dial 28-Sep-2014, 12:56 PM   Mee Hives, PT MS Acute Rehab Dept. Number: [phone]

## 2014-09-20 NOTE — Discharge Summary (Signed)
Discharge Summary     [name] 15-Feb-1941 74 y.o. female  [id number]  Admission Date: 09/12/2014  Discharge Date: 09/20/14  Physician: Rosetta Posner, MD  Admission Diagnosis: Left foot gangrene I96 left foot necrosis, peripheral vascular disease   HPI:   This is a 74 y.o. female who is seen today for evaluation of gangrenous changes of her left foot. She is very pleasant 74 year old with a very complex past medical history. She did have nonhealing ulcerations of her right foot in 2008 and had a partial foot amputation and subsequently a below-knee amputation by Dr. Ninfa Linden. She has had issues with wound problems in her left foot since the last 6-8 months. She did have healing of this and then has had recurrences. She does have neuropathy with not much pain associated with her left foot. She does walk with a right leg prosthesis. The mortise changes of her left great toe been progressive and she is developing changes in her right foot as well. She did undergo an MRI yesterday which I have reviewed as well. Her past history is significant for bladder cancer. She had a resection of this with an extensive surgery at [facility] and had a myocardial infarction following this. She also has a history of renal insufficiency but has not been on hemodialysis and does have stage III chronic renal insufficiency  Hospital Course:  The patient was admitted to the hospital and taken to the operating room on 09/12/2014  Left femoral to anterior tibial bypass with translocated non-reversed great saphenous vein     The pt tolerated the procedure well and was transported to the PACU in good condition.   By POD 1, she was having pain issues and this was brought under control. An orthopedic consult was obtained.  Post operative ABI's on 09/14/14 are as follows:  RIGHT    LEFT    PRESSURE WAVEFORM  PRESSURE WAVEFORM  BRACHIAL 129 Triphasic BRACHIAL 138 Triphasic  AT  BKA  AT 99 Sharp Monophasic  PT BKA  PT 71 Sharp Diminished Monophasic Possibly Collateral Vessel    RIGHT LEFT  ABI BKA 0.72   On 09/14/14, she was taken back to the operating room by Dr. Ninfa Linden and underwent a left foot first ray amputation for left foot ischemia with first ray exposed bone, gouty arthropathy changes and infection.  She tolerated well.  A darco shoe was brought to the pt.  Post operatively, per Dr. Ninfa Linden, her left foot dressing is clean and dry.  He will leave the dressing for a total of 5 days and remove it in his office.  On POD 4, she was tachycardic on po metoprolol.  A consult to cardiology was placed.  She was found to be in ST with short runs of PAT.  She was asymptomatic.  With the increased stress of surgery and deconditioning as well as volume depletion, she was hydrated overnight.  Lasix was held.  She did have acute surgical blood loss anemia and was transfused one unit of PRBC's.  Her creatinine did improve from 1.87 to 1.13 with hydration.  Her insurance has denied CIR and she will be transferred to a rehab facility.  The remainder of the hospital course consisted of increasing mobilization and increasing intake of solids without difficulty.  CBC    Component Value Date/Time   WBC 6.2 09/20/2014 0553   RBC 3.27* 09/20/2014 0553   HGB 8.7* 09/20/2014 0553   HCT 27.8* 09/20/2014 6213  PLT 362 09/20/2014 0553   MCV 85.0 09/20/2014 0553   MCH 26.6 09/20/2014 0553   MCHC 31.3 09/20/2014 0553   RDW 15.1 09/20/2014 0553   LYMPHSABS 1.3 09/16/2014 1137   MONOABS 0.8 09/16/2014 1137   EOSABS 0.5 09/16/2014 1137   BASOSABS 0.0 09/16/2014 1137    BMET    Component Value Date/Time   NA 135 09/20/2014 0553   K 4.1 09/20/2014 0553   CL 102 09/20/2014 0553   CO2 27 09/20/2014 0553   GLUCOSE 129* 09/20/2014 0553   BUN 28* 09/20/2014 0553   CREATININE 1.13* 09/20/2014 0553   CALCIUM 8.7 09/20/2014 0553   GFRNONAA 47* 09/20/2014  0553   GFRAA 54* 09/20/2014 0553      Discharge Instructions    Call MD for:  redness, tenderness, or signs of infection (pain, swelling, bleeding, redness, odor or green/yellow discharge around incision site)    Complete by:  As directed      Call MD for:  severe or increased pain, loss or decreased feeling  in affected limb(s)    Complete by:  As directed      Call MD for:  temperature >100.5    Complete by:  As directed      Discharge wound care:    Complete by:  As directed   Wash the groin wound with soap and water daily and pat dry. (No tub bath-only shower)  Then put a dry gauze or washcloth there to keep this area dry daily and as needed.  Do not use Vaseline or neosporin on your incisions.  Only use soap and water on your incisions and then protect and keep dry.  This is crucial to keep dry to help prevent infection in the left groin.  ABD pad to left groin TID and as needed to wick moisture.     Driving Restrictions    Complete by:  As directed   No driving for 2 weeks     Lifting restrictions    Complete by:  As directed   No heavy lifting for 4 weeks     Resume previous diet    Complete by:  As directed            Discharge Diagnosis:  Left foot gangrene I96 left foot necrosis, peripheral vascular disease  Secondary Diagnosis: Patient Active Problem List   Diagnosis Date Noted  . Anemia 09/17/2014  . Non-healing wound of lower extremity 09/12/2014  . Atherosclerosis of artery of extremity with ulceration 09/08/2014  . Coronary artery disease 08/24/2013  . 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 Peripheral vascular disease 08/24/2013  . Diabetes mellitus with peripheral vascular disease 08/24/2013  . Status post above knee amputation of right lower extremity 08/24/2013  . Left shoulder pain 08/24/2013  . Obesity, unspecified 08/24/2013  . Constipation 06/08/2013  . UTI (lower urinary tract infection) 06/08/2013  . Dehydration 01/27/2013  . Hypokalemia  01/27/2013  . Chest pain 01/26/2013  . CAD (coronary artery disease) 01/26/2013  . Sinus tachycardia 01/26/2013  . CKD (chronic kidney disease) stage 3, GFR 30-59 ml/min 01/26/2013  . Type 2 diabetes mellitus with hyperosmolar nonketotic hyperglycemia 01/26/2013  . Metabolic acidosis, normal anion gap (NAG) 11/14/2012  . DM2 (diabetes mellitus, type 2) 11/14/2012  . HTN (hypertension) 11/14/2012  . 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 Hypertension   . Hyperlipidemia   .  History of atrial tachycardia CONTROLLED W/ LOPRESSOR  . Peripheral vascular disease S/P RIGHT BKA  . Headache(784.0)   . Peripheral neuropathy LEGS AND HANDS  . Arthritis   . S/P BKA (below knee amputation) unilateral RIGHT  . GERD (gastroesophageal reflux disease)   . Anemia   . Blood transfusion   . Diabetes mellitus ORAL AND INSULIN MEDS  . Macular degeneration of both eyes   . Glaucoma   . Retinopathy due to secondary diabetes mellitus   . Insomnia   . Constipation   . History of bladder cancer TCC AND CIS  . MI (mitral incompetence)   . Coronary artery disease   . Chronic kidney disease   . Cancer     bladder  . Myocardial infarction        Medication List    TAKE these medications        acetaminophen 500 MG tablet  Commonly known as:  TYLENOL  Take 1,000 mg by mouth every 6 (six) hours as needed.     amoxicillin 500 MG tablet  Commonly known as:  AMOXIL  Take 500 mg by mouth 3 (three) times daily.     aspirin 81 MG chewable tablet  Chew 81 mg by mouth at bedtime.     atorvastatin 80 MG tablet  Commonly known as:  LIPITOR  Take 1 tablet (80 mg total) by mouth every evening.     clopidogrel 75 MG tablet  Commonly known as:  PLAVIX  TAKE ONE TABLET BY MOUTH DAILY     furosemide 40 MG tablet  Commonly known as:  LASIX  Take 80 mg by mouth 2 (two) times  daily.     insulin glargine 100 UNIT/ML injection  Commonly known as:  LANTUS  Inject 55-60 Units into the skin 2 (two) times daily. 55 units in the morning and 60 units at night     insulin lispro 100 UNIT/ML injection  Commonly known as:  HUMALOG  Inject 20-50 Units into the skin daily as needed for high blood sugar. PER SLIDING SCALE     isosorbide mononitrate 30 MG 24 hr tablet  Commonly known as:  IMDUR  Take 1 tablet (30 mg total) by mouth daily.     Melatonin 10 MG Tabs  Take 10 mg by mouth at bedtime.     metoprolol succinate 100 MG 24 hr tablet  Commonly known as:  TOPROL-XL  TAKE 1 TABLET (100 MG TOTAL) BY MOUTH EVERY EVENING. TAKE WITH OR IMMEDIATELY FOLLOWING A MEAL.     nitroGLYCERIN 0.4 MG SL tablet  Commonly known as:  NITROSTAT  Place 0.4 mg under the tongue every 5 (five) minutes as needed for chest pain.     omeprazole 20 MG capsule  Commonly known as:  PRILOSEC  Take 20 mg by mouth daily as needed (gurd).     oxyCODONE 5 MG immediate release tablet  Commonly known as:  Oxy IR/ROXICODONE  Take 1-2 tablets (5-10 mg total) by mouth every 4 (four) hours as needed for moderate pain.     polyethylene glycol packet  Commonly known as:  MIRALAX / GLYCOLAX  Take 17 g by mouth daily.     potassium chloride SA 20 MEQ tablet  Commonly known as:  K-DUR,KLOR-CON  Take 20 mEq by mouth daily.     PRESERVISION AREDS PO  Take 1 capsule by mouth 2 (two) times daily.     SODIUM BICARBONATE PO  Take 20 g by mouth 2 (two)  times daily.     VEGETABLE LAXATIVE PO  Take 4 tablets by mouth at bedtime.        Roxicodone #30 No Refill  Disposition: SNF  Patient's condition: is Good  Follow up: 1. Dr. Donnetta Hutching in 2 weeks 2. Dr. Jean Rosenthal one week.  Discharge Instructions: Wash the groin wound with soap and water daily and pat dry. (No tub bath-only shower)  Then put a dry gauze or washcloth there to keep this area dry daily and as needed.  Do not use  Vaseline or neosporin on your incisions.  Only use soap and water on your incisions and then protect and keep dry.  IT IS IMPORTANT TO KEEP LEFT GROIN DRY AND KEEP AN ABD PAD OVER THE INCISION TO HELP WICK MOISTURE AND CHANGE THIS TID AND AS NEEDED TO HELP PREVENT INFECTION AS SHE IS AT HIGHER RISK!!   Leontine Locket, PA-C [facility] [phone] 09/20/2014  10:36 AM  - For VQI Registry use --- Instructions: Press F2 to tab through selections.  Delete question if not applicable.   Post-op:  Wound infection: No  Graft infection: No  Transfusion: Yes  If yes, 1 units given New Arrhythmia: Yes-ST Ipsilateral amputation: no, [ ]  Minor, [ ] , [ ]  AKA Discharge patency: [x ] Primary, [ ]  Primary assisted, [ ]  Secondary, [ ]  Occluded Patency judged by: [ ]  Dopper only, [ ]  Palpable graft pulse, [ ]  Palpable popliteal distal pulse, [ ]  ABI inc. > 0.15, [ ]  Duplex Discharge ABI: R 0.72, L BKA Discharge TBI: R , L  D/C Ambulatory Status: Ambulatory with Assistance  Complications: MI: No, [ ]  Troponin only, [ ]  EKG or Clinical CHF: No Resp failure:No, [ ]  Pneumonia, [ ]  Ventilator Chg in renal function: Yes, [ x] Inc. Cr > 0.5-back to baseline, [ ]  Temp. Dialysis, [ ]  Permanent dialysis Stroke: No, [ ]  Minor, [ ]  Major Return to OR: No  Reason for return to OR: [ ]  Bleeding, [ ]  Infection, [ ]  Thrombosis, [ ]  Revision  Discharge medications: Statin use:  yes ASA use:  yes Plavix use:  yes Beta blocker use: yes Coumadin use: no

## 2014-09-20 NOTE — Progress Notes (Addendum)
  Progress Note    09/20/2014 8:12 AM 6 Days Post-Op  Subjective:  Had some foot pain this morning, but this has improved.  Pain medicine is working.  Afebrile 329'V-916'O systolic HR 06'Y NSR 04% RA  Filed Vitals:   09/20/14 0500  BP: 113/46  Pulse: 85  Temp: 98.6 F (37 C)  Resp: 16    Physical Exam: Lungs:  Non labored Incisions:  C/d/i with steri strips in place on leg; left groin Extremities:  Left foot is wrapped; +biphasic doppler flow left AT/peroneal; warm Abdomen:  obese  CBC    Component Value Date/Time   WBC 6.2 09/20/2014 0553   RBC 3.27* 09/20/2014 0553   HGB 8.7* 09/20/2014 0553   HCT 27.8* 09/20/2014 0553   PLT 362 09/20/2014 0553   MCV 85.0 09/20/2014 0553   MCH 26.6 09/20/2014 0553   MCHC 31.3 09/20/2014 0553   RDW 15.1 09/20/2014 0553   LYMPHSABS 1.3 09/16/2014 1137   MONOABS 0.8 09/16/2014 1137   EOSABS 0.5 09/16/2014 1137   BASOSABS 0.0 09/16/2014 1137    BMET    Component Value Date/Time   NA 135 09/20/2014 0553   K 4.1 09/20/2014 0553   CL 102 09/20/2014 0553   CO2 27 09/20/2014 0553   GLUCOSE 129* 09/20/2014 0553   BUN 28* 09/20/2014 0553   CREATININE 1.13* 09/20/2014 0553   CALCIUM 8.7 09/20/2014 0553   GFRNONAA 47* 09/20/2014 0553   GFRAA 54* 09/20/2014 0553    INR    Component Value Date/Time   INR 1.04 09/12/2014 0949     Intake/Output Summary (Last 24 hours) at 09/20/14 5997 Last data filed at 09/20/14 0700  Gross per 24 hour  Intake    480 ml  Output   1500 ml  Net  -1020 ml     Assessment:  74 y.o. female is s/p:  Left femoral to anterior tibial bypass with translocated non-reversed great saphenous vein  8 Days Post-Op And Left foot first ray resection/amputation (ortho) 5 Days Post Op   Plan: -pt has biphasic doppler signals left AT/Peroneal -DVT prophylaxis: not on any at this time due to high risk of bleeding.  Hgb is down today from 9.4 to 8.7.  Will hold off on Lovenox at this time and recheck  hgb in am  -creatinine continues to improve. -it is crucial to keep an abd pad in the left groin to help wick moisture to try and help prevent wound infection as she is high risk.   -heplock IV -insurance has denied CIR-will order social work consult for placement or possibly HHPT if she improves here. (she has not walked in the halls)    Leontine Locket, [place] [facility] [phone] 09/20/2014 8:12 AM    I have examined the patient, reviewed and agree with above. Easily palpable popliteal graft pulse. Okay for discharge when placement is arranged.  Yashua Bracco, MD 09/20/2014 8:41 AM

## 2014-09-20 NOTE — Clinical Social Work Note (Signed)
Patient will discharge to [facility] SNF Anticipated discharge date:09/20/14 Family notified: daughter and pt friend notified of DC Transportation by [employer]- scheduled for 2:30pm  CSW signing off.  Domenica Reamer, [place] Social Worker [phone]

## 2014-09-20 NOTE — Consult Note (Signed)
Met with the patient at bedside to offer [place] Management services as benefit of [employer] insurance. Patient agreeable to services and will receive post hospital discharge call and will be evaluated for monthly home visits. Patient had a lot of question regarding her disposition.  Patient signed consent and copy of [place] management folder.   Of note, [facility] Care Management services does not replace or interfere with any services that are arranged by inpatient case management or social work. For additional questions or referrals please contact, Natividad Brood, RN BSN CCM, [facility] Liaison at [phone].

## 2014-09-20 NOTE — Telephone Encounter (Signed)
-----   Message from Gabriel Earing, [place] sent at 09/20/2014 10:35 AM EST ----- S/p Left femoral to anterior tibial bypass with translocated non-reversed great saphenous vein  8 Days Post-Op  F/u with Dr. Donnetta Hutching in 2 weeks.  Will need ABI's if she does not get them before being d/c'd to facility today.  Thanks, samantha

## 2014-09-20 NOTE — Progress Notes (Signed)
Pt D/C to skill facility per MD order, report in regards to pt current medical status given to an LPN Antionette Jimmye Norman and all querstions answered. Pt transported via the EMS with supporting document.

## 2014-09-20 NOTE — Discharge Instructions (Signed)
You may put all of your weight on your left foot in the special post-op shoe. Keep your foot dressing clean and dry. Can place a new dry dressing daily to every other day.

## 2014-09-21 LAB — GLUCOSE, CAPILLARY: GLUCOSE-CAPILLARY: 149 mg/dL — AB (ref 70–99)

## 2014-10-03 ENCOUNTER — Encounter: Payer: Self-pay | Admitting: Vascular Surgery

## 2014-10-04 ENCOUNTER — Encounter: Payer: Self-pay | Admitting: Vascular Surgery

## 2014-10-04 ENCOUNTER — Ambulatory Visit (INDEPENDENT_AMBULATORY_CARE_PROVIDER_SITE_OTHER): Payer: Self-pay | Admitting: Vascular Surgery

## 2014-10-04 VITALS — BP 127/45 | HR 67 | Resp 18 | Ht 64.0 in | Wt 212.0 lb

## 2014-10-04 DIAGNOSIS — I70269 Atherosclerosis of native arteries of extremities with gangrene, unspecified extremity: Secondary | ICD-10-CM

## 2014-10-04 NOTE — Progress Notes (Signed)
Patient is today for follow-up of her left femoral to anterior tibial bypass with translocated non-reversed great saphenous vein on 09/12/2014. She subsequently underwent great toe amputation with Dr. Ninfa Linden several days later. She has been in a assisted living facility and plans to go home at the end of this week. She looks quite well today. She is in great spirits. She is walking with her right below-knee prosthesis and the noncontact boot on the left foot. She saw Dr. Ninfa Linden and had sutures removed yesterday. I did examine her toe amputation site and is healing quite nicely.  Her vein harvesting surgical incisions are all healing as well. She does have easily palpable popliteal graft pulse.  Impression and plan successful [name] result of femoral anterior tibial bypass for limb salvage. She will see [place] again in 3 months for ankle arm indices and graft scan she'll notify should she develop any difficulty in the interim

## 2014-10-05 NOTE — Addendum Note (Signed)
Addended by: Mena Goes on: 10/05/2014 01:32 PM   Modules accepted: Orders

## 2014-10-18 ENCOUNTER — Encounter: Payer: Self-pay | Admitting: Vascular Surgery

## 2014-10-18 ENCOUNTER — Telehealth: Payer: Self-pay | Admitting: *Deleted

## 2014-10-18 ENCOUNTER — Ambulatory Visit (INDEPENDENT_AMBULATORY_CARE_PROVIDER_SITE_OTHER): Payer: Self-pay | Admitting: Vascular Surgery

## 2014-10-18 ENCOUNTER — Telehealth: Payer: Self-pay

## 2014-10-18 VITALS — BP 146/65 | HR 102 | Temp 98.7°F | Ht 64.0 in | Wt 216.0 lb

## 2014-10-18 DIAGNOSIS — I70269 Atherosclerosis of native arteries of extremities with gangrene, unspecified extremity: Secondary | ICD-10-CM

## 2014-10-18 MED ORDER — OXYCODONE HCL 5 MG PO TABS: 5.0000 mg | ORAL_TABLET | Freq: Four times a day (QID) | ORAL | Status: DC | PRN

## 2014-10-18 NOTE — Telephone Encounter (Signed)
Patient had gone to [facility] stating she had not received her pain medication prescription but upon calling them in the meantime she had found it and was returning to [place] with it.

## 2014-10-18 NOTE — Addendum Note (Signed)
Addended by: Mena Goes on: 10/18/2014 11:25 AM   Modules accepted: Orders

## 2014-10-18 NOTE — Telephone Encounter (Signed)
Phone call from pt.  Reported since Sunday she had 2 blisters develop on left lower leg.  Reported that there is redness from her ankle to the mid calf.  Stated she had a gauze wrap on the left lower leg from the ankle to up above the incision, and was changing it about every other day, when she noticed the change on Sunday.  Reported the blisters have drained now, but her leg is very sore and has been "throbbing."  Reported continued swelling of left lower leg.  Reported the swelling does improve overnight, but doesn't completely resolve.  Appt. given for today at 10:30 AM to evaluate symptoms left LE.  Agrees with plan.

## 2014-10-18 NOTE — Progress Notes (Signed)
Since today with concern of superficial ulceration on her calf. She has had the ulcerations in the past. This does appear to be typical of venous ulcerations with some swelling. She is completely healed surgical incisions and her great toe transmetatarsal amputation is healing extremely well. She does have 2-3+ dorsalis pedis pulse with a well-perfused foot.  I did discuss options regarding treatment for this. I explained she could have Silvadene daily dressing with Ace wrap or Unna boot. She has had improvement in the past and was quite successful. We will place the Unna boot weekly and I will see her again in 3

## 2014-10-24 ENCOUNTER — Encounter: Payer: Self-pay | Admitting: Vascular Surgery

## 2014-10-25 ENCOUNTER — Ambulatory Visit (INDEPENDENT_AMBULATORY_CARE_PROVIDER_SITE_OTHER): Payer: PPO | Admitting: *Deleted

## 2014-10-25 DIAGNOSIS — L98499 Non-pressure chronic ulcer of skin of other sites with unspecified severity: Secondary | ICD-10-CM | POA: Diagnosis not present

## 2014-10-25 DIAGNOSIS — I70269 Atherosclerosis of native arteries of extremities with gangrene, unspecified extremity: Secondary | ICD-10-CM

## 2014-10-25 DIAGNOSIS — I739 Peripheral vascular disease, unspecified: Secondary | ICD-10-CM

## 2014-10-25 NOTE — Progress Notes (Signed)
Removed Unna boot from left leg.  Small amount drainage noted on [employer].  No odor noted. Small ulcerations appear to be healing.  Cleaned left leg with CarraKlenz.  Mrs. Beverley states leg swelling is better today.  Reapplied [employer] to left leg.  Reminded Mrs. Issacs of weekly Unna Boot change scheduled for next Tuesday, March 22.

## 2014-10-29 ENCOUNTER — Other Ambulatory Visit: Payer: Self-pay | Admitting: Cardiology

## 2014-10-31 ENCOUNTER — Encounter: Payer: Self-pay | Admitting: Vascular Surgery

## 2014-11-01 ENCOUNTER — Ambulatory Visit (INDEPENDENT_AMBULATORY_CARE_PROVIDER_SITE_OTHER): Payer: PPO | Admitting: *Deleted

## 2014-11-01 DIAGNOSIS — I739 Peripheral vascular disease, unspecified: Secondary | ICD-10-CM

## 2014-11-01 DIAGNOSIS — I70269 Atherosclerosis of native arteries of extremities with gangrene, unspecified extremity: Secondary | ICD-10-CM

## 2014-11-01 NOTE — Progress Notes (Signed)
Patient in for unna boot change.  I cleansed the leg with Pete Pelt then applied unna boot to left leg.  Wound showed no sign of drainage or redness.  Patient is returning next week for a follow appointment.

## 2014-11-07 ENCOUNTER — Encounter: Payer: Self-pay | Admitting: Vascular Surgery

## 2014-11-08 ENCOUNTER — Encounter: Payer: Self-pay | Admitting: Vascular Surgery

## 2014-11-08 ENCOUNTER — Ambulatory Visit (INDEPENDENT_AMBULATORY_CARE_PROVIDER_SITE_OTHER): Payer: Self-pay | Admitting: Vascular Surgery

## 2014-11-08 VITALS — BP 128/82 | HR 83 | Resp 16 | Ht 63.0 in | Wt 214.0 lb

## 2014-11-08 DIAGNOSIS — I739 Peripheral vascular disease, unspecified: Secondary | ICD-10-CM

## 2014-11-08 DIAGNOSIS — I70209 Unspecified atherosclerosis of native arteries of extremities, unspecified extremity: Secondary | ICD-10-CM

## 2014-11-08 DIAGNOSIS — L98499 Non-pressure chronic ulcer of skin of other sites with unspecified severity: Secondary | ICD-10-CM

## 2014-11-08 NOTE — Addendum Note (Signed)
Addended by: Mena Goes on: 11/08/2014 04:54 PM   Modules accepted: Orders

## 2014-11-08 NOTE — Progress Notes (Signed)
    Established Previous Bypass  History of Present Illness  [name] is a 74 y.o. (11/05/1940) female who presents for follow-up s/p left lower femoral to anterior tibial bypass with translocated non-reversed great saphenous vein on 09/12/14. She subsequently underwent left great toe amputation by Dr. Ninfa Linden several days later. She presented to the [facility] office three weeks ago with superficial ulcerations of her left lower leg that was consistent with venous stasis ulcers, of which she was had before. She underwent weekly unna boot dressings and is here for follow-up.   She denies any significant pain. She is ambulating well with her right below-knee prosthesis.   Physical Examination  Filed Vitals:   11/08/14 1445  BP: 128/82  Pulse: 83  Resp: 16  Height: 5\' 3"  (1.6 m)  Weight: 214 lb (97.07 kg)   Body mass index is 37.92 kg/(m^2).  Physical Examination  Filed Vitals:   11/08/14 1445  BP: 128/82  Pulse: 83  Resp: 16  Height: 5\' 3"  (1.6 m)  Weight: 214 lb (97.07 kg)   Body mass index is 37.92 kg/(m^2).  General: A&O x 3, WDWN in NAD Vascular: left bypass incisions well healed. Ulcerations of left lower leg well healed. No current ulcerations. Left great toe amputation site well healed. 2+ left dorsalis pedis pulse.   Medical Decision Making  [name] is a 74 y.o. female who is s/p left femoral to anterior tibial bypass with translocated non-reversed great saphenous vein on 09/12/2014. She presented with superficial venous ulcerations and has undergone unna boot dressings for the past three weeks. Her ulcerations are well healed today. Her surgical incisions and great toe amputation sites are healed. She has a  2+ left dorsalis pedis pulse. She no longer needs any unna boot dressings and can resume wearing her regular compression stockings. She will follow in three months with ABIs and left bypass duplex.   Virgina Jock, PA-C [facility]  [place] Office: [phone] Pager: [phone]  11/08/2014, 3:21 PM  This patient was seen in conjunction with Dr. Donnetta Hutching    I have examined the patient, reviewed and agree with above. Doing extremely well.  First toe Amputation healed. Venous stasis ulcerations improved and will convert her compression garment only. Easily palpable dorsalis pedis pulse. Will be seen again in 3 months  EARLY, TODD, MD 11/08/2014 3:35 PM

## 2014-12-13 ENCOUNTER — Other Ambulatory Visit (HOSPITAL_COMMUNITY): Payer: Self-pay | Admitting: Nephrology

## 2014-12-13 ENCOUNTER — Ambulatory Visit (HOSPITAL_COMMUNITY)
Admission: RE | Admit: 2014-12-13 | Discharge: 2014-12-13 | Disposition: A | Discharge status: Home / Self Care | Payer: PPO | Source: Ambulatory Visit | Attending: Vascular Surgery | Admitting: Vascular Surgery

## 2014-12-13 DIAGNOSIS — R609 Edema, unspecified: Secondary | ICD-10-CM

## 2014-12-27 ENCOUNTER — Other Ambulatory Visit: Payer: Self-pay | Admitting: Cardiology

## 2015-01-03 ENCOUNTER — Ambulatory Visit: Payer: PPO | Admitting: Vascular Surgery

## 2015-01-03 ENCOUNTER — Other Ambulatory Visit (HOSPITAL_COMMUNITY): Payer: PPO

## 2015-01-03 ENCOUNTER — Encounter (HOSPITAL_COMMUNITY): Payer: PPO

## 2015-02-08 ENCOUNTER — Encounter: Payer: Self-pay | Admitting: Vascular Surgery

## 2015-02-14 ENCOUNTER — Ambulatory Visit (INDEPENDENT_AMBULATORY_CARE_PROVIDER_SITE_OTHER)
Admission: RE | Admit: 2015-02-14 | Discharge: 2015-02-14 | Disposition: A | Discharge status: Home / Self Care | Payer: PPO | Source: Ambulatory Visit | Attending: Vascular Surgery | Admitting: Vascular Surgery

## 2015-02-14 ENCOUNTER — Other Ambulatory Visit: Payer: Self-pay | Admitting: Vascular Surgery

## 2015-02-14 ENCOUNTER — Ambulatory Visit (HOSPITAL_COMMUNITY)
Admission: RE | Admit: 2015-02-14 | Discharge: 2015-02-14 | Disposition: A | Discharge status: Home / Self Care | Payer: PPO | Source: Ambulatory Visit | Attending: Vascular Surgery | Admitting: Vascular Surgery

## 2015-02-14 ENCOUNTER — Ambulatory Visit (INDEPENDENT_AMBULATORY_CARE_PROVIDER_SITE_OTHER): Payer: PPO | Admitting: Vascular Surgery

## 2015-02-14 ENCOUNTER — Encounter: Payer: Self-pay | Admitting: Vascular Surgery

## 2015-02-14 VITALS — BP 126/61 | HR 68 | Resp 16 | Ht 63.0 in | Wt 204.0 lb

## 2015-02-14 DIAGNOSIS — Z48812 Encounter for surgical aftercare following surgery on the circulatory system: Secondary | ICD-10-CM

## 2015-02-14 DIAGNOSIS — Z89511 Acquired absence of right leg below knee: Secondary | ICD-10-CM | POA: Diagnosis not present

## 2015-02-14 DIAGNOSIS — I739 Peripheral vascular disease, unspecified: Secondary | ICD-10-CM

## 2015-02-14 DIAGNOSIS — L98499 Non-pressure chronic ulcer of skin of other sites with unspecified severity: Secondary | ICD-10-CM

## 2015-02-14 DIAGNOSIS — Z89412 Acquired absence of left great toe: Secondary | ICD-10-CM | POA: Insufficient documentation

## 2015-02-14 DIAGNOSIS — I70209 Unspecified atherosclerosis of native arteries of extremities, unspecified extremity: Secondary | ICD-10-CM

## 2015-02-14 DIAGNOSIS — L97229 Non-pressure chronic ulcer of left calf with unspecified severity: Secondary | ICD-10-CM | POA: Insufficient documentation

## 2015-02-14 DIAGNOSIS — I70269 Atherosclerosis of native arteries of extremities with gangrene, unspecified extremity: Secondary | ICD-10-CM | POA: Diagnosis not present

## 2015-02-14 NOTE — Progress Notes (Signed)
Patient is today for follow-up of her left femoral to anterior tibial bypass. This was done on the February 2016. She continues to have some changes consistent with venous stasis disease on her calf. She is completely healed her left toe amputation.  Past Medical History  Diagnosis Date  . Hypertension   . Hyperlipidemia   . History of atrial tachycardia CONTROLLED W/ LOPRESSOR  . Peripheral vascular disease S/P RIGHT BKA  . Headache(784.0)   . Peripheral neuropathy LEGS AND HANDS  . Arthritis   . S/P BKA (below knee amputation) unilateral RIGHT  . GERD (gastroesophageal reflux disease)   . Anemia   . Blood transfusion   . Diabetes mellitus ORAL AND INSULIN MEDS  . Macular degeneration of both eyes   . Glaucoma   . Retinopathy due to secondary diabetes mellitus   . Insomnia   . Constipation   . History of bladder cancer TCC AND CIS  . MI (mitral incompetence)   . Coronary artery disease   . Chronic kidney disease   . Cancer     bladder  . Myocardial infarction     History  Substance Use Topics  . Smoking status: Former Smoker -- 2.00 packs/day for 17 years    Types: Cigarettes    Quit date: 10/09/1975  . Smokeless tobacco: Never Used  . Alcohol Use: 0.0 oz/week    0 Standard drinks or equivalent per week     Comment: RARE    Family History  Problem Relation Age of Onset  . Heart failure Mother     died age 32  . Sudden death Father 30  . Heart disease Father   . Hypertension Father   . Heart attack Father   . Diabetes Brother   . Heart attack Brother   . AAA (abdominal aortic aneurysm) Brother     Allergies  Allergen Reactions  . Biaxin [Clarithromycin] Itching  . Clarithromycin Itching  . Lisinopril Other (See Comments)    Increased creatinine levels (pt currently taking low dose)  . Metformin And Related Other (See Comments)    Increased creatinine levels     Current outpatient prescriptions:  .  acetaminophen (TYLENOL) 500 MG tablet, Take 1,000 mg  by mouth every 6 (six) hours as needed. , Disp: , Rfl:  .  aspirin 81 MG chewable tablet, Chew 81 mg by mouth at bedtime. , Disp: , Rfl:  .  atorvastatin (LIPITOR) 80 MG tablet, TAKE 1 TABLET (80 MG TOTAL) BY MOUTH EVERY EVENING., Disp: 90 tablet, Rfl: 1 .  clopidogrel (PLAVIX) 75 MG tablet, TAKE ONE TABLET BY MOUTH DAILY, Disp: 90 tablet, Rfl: 1 .  furosemide (LASIX) 40 MG tablet, Take 80 mg by mouth 2 (two) times daily. , Disp: , Rfl:  .  insulin glargine (LANTUS) 100 UNIT/ML injection, Inject 55-60 Units into the skin 2 (two) times daily. 55 units in the morning and 60 units at night, Disp: , Rfl:  .  insulin lispro (HUMALOG) 100 UNIT/ML injection, Inject 20-50 Units into the skin daily as needed for high blood sugar. PER SLIDING SCALE, Disp: , Rfl:  .  isosorbide mononitrate (IMDUR) 30 MG 24 hr tablet, Take 1 tablet (30 mg total) by mouth daily., Disp: 30 tablet, Rfl: 11 .  Melatonin 10 MG TABS, Take 10 mg by mouth at bedtime. , Disp: , Rfl:  .  metoprolol succinate (TOPROL-XL) 100 MG 24 hr tablet, TAKE 1 TABLET (100 MG TOTAL) BY MOUTH EVERY EVENING. TAKE WITH OR IMMEDIATELY  FOLLOWING A MEAL., Disp: 90 tablet, Rfl: 1 .  Multiple Vitamins-Minerals (PRESERVISION AREDS PO), Take 1 capsule by mouth 2 (two) times daily., Disp: , Rfl:  .  nitroGLYCERIN (NITROSTAT) 0.4 MG SL tablet, Place 0.4 mg under the tongue every 5 (five) minutes as needed for chest pain., Disp: , Rfl:  .  omeprazole (PRILOSEC) 20 MG capsule, Take 20 mg by mouth daily as needed (gurd). , Disp: , Rfl:  .  polyethylene glycol (MIRALAX / GLYCOLAX) packet, Take 17 g by mouth daily., Disp: 14 each, Rfl: 0 .  potassium chloride SA (K-DUR,KLOR-CON) 20 MEQ tablet, Take 20 mEq by mouth daily. , Disp: , Rfl:  .  Psyllium (VEGETABLE LAXATIVE PO), Take 4 tablets by mouth at bedtime., Disp: , Rfl:  .  SODIUM BICARBONATE PO, Take 20 g by mouth 2 (two) times daily., Disp: , Rfl:  .  amoxicillin (AMOXIL) 500 MG tablet, Take 500 mg by mouth 3  (three) times daily., Disp: , Rfl:  .  oxyCODONE (OXY IR/ROXICODONE) 5 MG immediate release tablet, Take 1 tablet (5 mg total) by mouth every 6 (six) hours as needed for moderate pain. (Patient not taking: Reported on 11/08/2014), Disp: 20 tablet, Rfl: 0  Filed Vitals:   02/14/15 1620  BP: 126/61  Pulse: 68  Resp: 16  Height: 5\' 3"  (1.6 m)  Weight: 204 lb (92.534 kg)  SpO2: 98%    Body mass index is 36.15 kg/(m^2).       On physical exam her surgical incisions are all well-healed. She has a 2+ dorsalis pedis pulse. She has a Silvadene dressing on the ulcerations on her calf.  She underwent noninvasive studies are office today and this reveals triphasic dorsalis pedis signal with a normal ankle arm index. Duplex imaging of her graft shows no evidence of significant stenosis.  Impression and plan: Stable vascular disease with patent femoral to anterior tibial bypass with normal flow to her foot. We'll continue her usual activity and see [place] again in 6 months with protocol follow-up

## 2015-02-15 NOTE — Addendum Note (Signed)
Addended by: Dorthula Rue L on: 02/15/2015 04:07 PM   Modules accepted: Orders

## 2015-03-13 ENCOUNTER — Other Ambulatory Visit: Payer: Self-pay | Admitting: Cardiology

## 2015-03-22 ENCOUNTER — Other Ambulatory Visit: Payer: Self-pay | Admitting: Cardiology

## 2015-04-28 ENCOUNTER — Encounter: Payer: Self-pay | Admitting: Cardiology

## 2015-04-28 ENCOUNTER — Ambulatory Visit (INDEPENDENT_AMBULATORY_CARE_PROVIDER_SITE_OTHER): Payer: PPO | Admitting: Cardiology

## 2015-04-28 VITALS — BP 132/64 | HR 87 | Ht 63.0 in | Wt 205.4 lb

## 2015-04-28 DIAGNOSIS — I739 Peripheral vascular disease, unspecified: Secondary | ICD-10-CM | POA: Diagnosis not present

## 2015-04-28 DIAGNOSIS — Z89611 Acquired absence of right leg above knee: Secondary | ICD-10-CM | POA: Diagnosis not present

## 2015-04-28 DIAGNOSIS — I2510 Atherosclerotic heart disease of native coronary artery without angina pectoris: Secondary | ICD-10-CM | POA: Diagnosis not present

## 2015-04-28 DIAGNOSIS — I2583 Coronary atherosclerosis due to lipid rich plaque: Secondary | ICD-10-CM

## 2015-04-28 DIAGNOSIS — E1151 Type 2 diabetes mellitus with diabetic peripheral angiopathy without gangrene: Secondary | ICD-10-CM | POA: Diagnosis not present

## 2015-04-28 DIAGNOSIS — I252 Old myocardial infarction: Secondary | ICD-10-CM

## 2015-04-28 NOTE — Patient Instructions (Addendum)
Medication Instructions:  Your physician has recommended you make the following change in your medication:   1-STOP Plavix  Labwork: NONE  Testing/Procedures: NONE  Follow-Up: Your physician wants you to follow-up in: 6 month with Dr. Marlou Porch. You will receive a reminder letter in the mail two months in advance. If you don't receive a letter, please call our office to schedule the follow-up appointment.

## 2015-04-28 NOTE — Progress Notes (Signed)
[facility]. [address], [address], [place]  [address] Phone: [phone] Fax:  [phone]  Date:  04/28/2015   ID:  [name], DOB 09-Aug-1941, MRN [id number]  PCP:  Gerrit Heck, MD   History of Present Illness: [name] is a 74 y.o. female with coronary artery disease status post MI in January of 2014 following bladder surgery at [facility], uncontrolled diabetes, hypertension, hyperlipidemia, peripheral vascular disease with right knee BKA, chronic kidney disease here for followup.  Prior hospitalizations 01/26/13 with chest pain, radiation to left arm with shortness of breath. Troponin was normal, EKG showed subtle ST segment depression in inferior leads. Echocardiogram showed normal EF, nuclear stress test showed no definitive evidence of prior infarction or ischemia. Attenuation artifact noted. Large area of attenuation involving the lateral wall may be from prior infarct. Medical management.   Prior right BKA. Prior skin grafting.  She has been doing very well, no chest pain, no shortness of breath.  Had 2 spells with heart. Took NTG. Once Sunday morning. Played piano at church. Did not want to go hosptal. Took Imdur and metoprolol.   CKD - Dr. Jimmy Footman. Increased lasix.   Her daughter has fallen, broke her hip.   Wt Readings from Last 3 Encounters:  04/28/15 205 lb 6.4 oz (93.169 kg)  02/14/15 204 lb (92.534 kg)  11/08/14 214 lb (97.07 kg)     Past Medical History  Diagnosis Date  . Hypertension   . Hyperlipidemia   . History of atrial tachycardia CONTROLLED W/ LOPRESSOR  . Peripheral vascular disease S/P RIGHT BKA  . Headache(784.0)   . Peripheral neuropathy LEGS AND HANDS  . Arthritis   . S/P BKA (below knee amputation) unilateral RIGHT  . GERD (gastroesophageal reflux disease)   . Anemia   . Blood transfusion   . Diabetes mellitus ORAL AND INSULIN MEDS  . Macular degeneration of both eyes   . Glaucoma   . Retinopathy due to  secondary diabetes mellitus   . Insomnia   . Constipation   . History of bladder cancer TCC AND CIS  . MI (mitral incompetence)   . Coronary artery disease   . Chronic kidney disease   . Cancer     bladder  . Myocardial infarction     Past Surgical History  Procedure Laterality Date  . I & d right below knee amputation wound  06-10-2011  . Cysto / resection bladder bx's  04-01-11  &  11-26-10  . Below knee leg amputation  05-02-2007    RIGHT  . Right foot i & d / removal necrotic bone  JUN  &  AUG 2008  . Right great toe amputation  11-05-2006  . Laparoscopic cholecystectomy  12-10-1999  . Back surgery  [place]  . Appendectomy  1963  . Cataract extraction w/ intraocular lens  implant, bilateral    . Cystoscopy with biopsy  10/14/2011    Procedure: CYSTOSCOPY WITH BIOPSY;  Surgeon: Claybon Jabs, MD;  Location: [facility];  Service: Urology;  Laterality: N/A;  BLADDER BIOPSY  . Coronary stent placement    . Abdominal hysterectomy    . Revision urostomy cutaneous    . Revision urostomy cutaneous    . Abdominal angiogram N/A 09/08/2014    Procedure: ABDOMINAL ANGIOGRAM;  Surgeon: Conrad Lampasas, MD;  Location: [facility] CATH LAB;  Service: Cardiovascular;  Laterality: N/A;  . Femoral-popliteal bypass graft Left 09/12/2014  Procedure:  LEFT FEMORAL-POPLITEAL ARTERY BYPASS GRAFT USING NON REVERSE LEFT GREATER SAPHENOUS VEIN;  Surgeon: Rosetta Posner, MD;  Location: [place];  Service: Vascular;  Laterality: Left;  . Amputation Left 09/14/2014    Procedure: LEFT FOOT FIRST RAY AMPUTATION;  Surgeon: Mcarthur Rossetti, MD;  Location: [facility];  Service: Orthopedics;  Laterality: Left;    Current Outpatient Prescriptions  Medication Sig Dispense Refill  . acetaminophen (TYLENOL) 500 MG tablet Take 1,000 mg by mouth every 6 (six) hours as needed.     Marland Kitchen aspirin 81 MG chewable tablet Chew 81 mg by mouth at bedtime.     Marland Kitchen atorvastatin (LIPITOR) 80 MG tablet TAKE 1 TABLET (80 MG  TOTAL) BY MOUTH EVERY EVENING. 90 tablet 1  . clopidogrel (PLAVIX) 75 MG tablet TAKE ONE TABLET BY MOUTH DAILY 90 tablet 1  . furosemide (LASIX) 40 MG tablet Take 80 mg by mouth 2 (two) times daily.     . insulin glargine (LANTUS) 100 UNIT/ML injection Inject 55-60 Units into the skin 2 (two) times daily. 55 units in the morning and 60 units at night    . insulin lispro (HUMALOG) 100 UNIT/ML injection Inject 20-50 Units into the skin daily as needed for high blood sugar. PER SLIDING SCALE    . isosorbide mononitrate (IMDUR) 30 MG 24 hr tablet Take 1 tablet (30 mg total) by mouth daily. 30 tablet 11  . Melatonin 10 MG TABS Take 10 mg by mouth at bedtime.     . metoprolol succinate (TOPROL-XL) 100 MG 24 hr tablet TAKE 1 TABLET (100 MG TOTAL) BY MOUTH EVERY EVENING. TAKE WITH OR IMMEDIATELY FOLLOWING A MEAL. 90 tablet 0  . Multiple Vitamins-Minerals (PRESERVISION AREDS PO) Take 1 capsule by mouth 2 (two) times daily.    . nitroGLYCERIN (NITROSTAT) 0.4 MG SL tablet Place 0.4 mg under the tongue every 5 (five) minutes as needed for chest pain.    Marland Kitchen omeprazole (PRILOSEC) 20 MG capsule Take 20 mg by mouth daily as needed (gurd).     . polyethylene glycol (MIRALAX / GLYCOLAX) packet Take 17 g by mouth daily. 14 each 0  . potassium chloride SA (K-DUR,KLOR-CON) 20 MEQ tablet Take 20 mEq by mouth daily.     . Psyllium (VEGETABLE LAXATIVE PO) Take 4 tablets by mouth at bedtime.    . SODIUM BICARBONATE PO Take 20 g by mouth 2 (two) times daily.     No current facility-administered medications for this visit.    Allergies:    Allergies  Allergen Reactions  . Clarithromycin Itching  . Lisinopril Other (See Comments)    Increased creatinine levels (pt currently taking low dose)  . Metformin And Related Other (See Comments)    Increased creatinine levels    Social History:  The patient  reports that she quit smoking about 39 years ago. Her smoking use included Cigarettes. She has a 34 pack-year smoking  history. She has never used smokeless tobacco. She reports that she drinks alcohol. She reports that she does not use illicit drugs.   ROS:  Please see the history of present illness.   No syncope, no fevers, no bleeding.   PHYSICAL EXAM: VS:  BP 132/64 mmHg  Pulse 87  Ht 5\' 3"  (1.6 m)  Wt 205 lb 6.4 oz (93.169 kg)  BMI 36.39 kg/m2  SpO2 98% Well nourished, well developed, in no acute distress HEENT: normal Neck: no JVD Cardiac:  normal S1, S2; RRR; no murmurectopy Lungs:  clear  to auscultation bilaterally, no wheezing, rhonchi or rales Abd: soft, nontender, no hepatomegaly Ext: no edemaRight BKA Skin: warm and dry, left foot wrapped, edematous leg, boot Neuro: no focal abnormalities noted  EKG: 08/17/14-sinus tachycardia rate 102 with nonspecific ST-T wave changes Labs: 07/20/14-hemoglobin A1c 13.3, BUN 54, creatinine 1.5 to, sodium 134, potassium 4.2. LDL 43, HDL 25 6/14-LDL 58. Excellent.  ASSESSMENT AND PLAN:  1. Coronary artery disease-circumflex stent in the setting of old MI following bladder cancer surgery. Currently doing well, medical therapy. Nuclear stress test 6/14 reassuring. It is been well over one year since the placement, we will discontinue Plavix. Reduce bleeding risk. She does have some minor bruising. She will occasionally have anginal symptoms.  Took nitroglycerin, extra isosorbide, this helped but the episode was quite severe she states. She had one episode prior to that that was brief and relieved with one nitroglycerin. We will continue to monitor if she has any further worsening symptoms. I expressed the importance of her continuing to work on her diabetes. 2. Old myocardial infarction-as above. Medical management. Prior OM stent. 3. Obesity-encourage weight loss. Low carbohydrate. 4. Peripheral vascular disease-right below-knee amputation. Aggressive management. Continue aspirin. 5. Diabetes-per Dr. Drema Dallas and team. Hemoglobin A1c of 13 previously, now 13.3.   endocrinologist  6. Six-month follow-up  Signed, Candee Furbish, MD [facility]  04/28/2015 2:57 PM

## 2015-06-07 ENCOUNTER — Other Ambulatory Visit: Payer: Self-pay | Admitting: Cardiology

## 2015-08-23 ENCOUNTER — Other Ambulatory Visit: Payer: Self-pay | Admitting: Cardiology

## 2015-08-25 ENCOUNTER — Encounter: Payer: Self-pay | Admitting: Family

## 2015-08-29 ENCOUNTER — Ambulatory Visit (INDEPENDENT_AMBULATORY_CARE_PROVIDER_SITE_OTHER)
Admission: RE | Admit: 2015-08-29 | Discharge: 2015-08-29 | Disposition: A | Discharge status: Home / Self Care | Payer: PPO | Source: Ambulatory Visit | Attending: Family | Admitting: Family

## 2015-08-29 ENCOUNTER — Ambulatory Visit (INDEPENDENT_AMBULATORY_CARE_PROVIDER_SITE_OTHER): Payer: PPO | Admitting: Family

## 2015-08-29 ENCOUNTER — Encounter: Payer: Self-pay | Admitting: Family

## 2015-08-29 ENCOUNTER — Ambulatory Visit (HOSPITAL_COMMUNITY)
Admission: RE | Admit: 2015-08-29 | Discharge: 2015-08-29 | Disposition: A | Discharge status: Home / Self Care | Payer: PPO | Source: Ambulatory Visit | Attending: Family | Admitting: Family

## 2015-08-29 VITALS — BP 100/50 | HR 92 | Ht 63.0 in | Wt 195.0 lb

## 2015-08-29 DIAGNOSIS — Z89511 Acquired absence of right leg below knee: Secondary | ICD-10-CM

## 2015-08-29 DIAGNOSIS — Z95828 Presence of other vascular implants and grafts: Secondary | ICD-10-CM

## 2015-08-29 DIAGNOSIS — I779 Disorder of arteries and arterioles, unspecified: Secondary | ICD-10-CM

## 2015-08-29 DIAGNOSIS — Z48812 Encounter for surgical aftercare following surgery on the circulatory system: Secondary | ICD-10-CM | POA: Insufficient documentation

## 2015-08-29 DIAGNOSIS — Z87891 Personal history of nicotine dependence: Secondary | ICD-10-CM | POA: Diagnosis not present

## 2015-08-29 DIAGNOSIS — E1151 Type 2 diabetes mellitus with diabetic peripheral angiopathy without gangrene: Secondary | ICD-10-CM

## 2015-08-29 DIAGNOSIS — I70269 Atherosclerosis of native arteries of extremities with gangrene, unspecified extremity: Secondary | ICD-10-CM

## 2015-08-29 DIAGNOSIS — Z89611 Acquired absence of right leg above knee: Secondary | ICD-10-CM | POA: Diagnosis not present

## 2015-08-29 DIAGNOSIS — E785 Hyperlipidemia, unspecified: Secondary | ICD-10-CM | POA: Diagnosis not present

## 2015-08-29 DIAGNOSIS — E119 Type 2 diabetes mellitus without complications: Secondary | ICD-10-CM | POA: Diagnosis not present

## 2015-08-29 DIAGNOSIS — I8312 Varicose veins of left lower extremity with inflammation: Secondary | ICD-10-CM | POA: Diagnosis not present

## 2015-08-29 DIAGNOSIS — I10 Essential (primary) hypertension: Secondary | ICD-10-CM | POA: Insufficient documentation

## 2015-08-29 NOTE — Progress Notes (Signed)
[facility] [place] HISTORY AND PHYSICAL -PAD  History of Present Illness [name] is a 75 y.o. female patient of Dr. Donnetta Hutching who returns for follow-up s/p left femoral to anterior tibial bypass in February 2016. She continues to have some changes consistent with venous stasis disease on her left calf, the ulcers have healed. She is completely healed her left toe amputation. She is s/p right BKA in 2008, she walks with a prosthesis and a cane. She states that she uses her w/c at home as she does not want to fall.  She does not walk enough to elicit claudication sx's other than when she goes to church.   She sees Dr. Jimmy Footman, nephrologist.   She has a urostomy as a result of bladder cancer surgery.   The patient denies New Medical or Surgical History.  Pt Diabetic: Yes, states not in good control, states a year ago her A1C was about 11 Pt smoker: former smoker, quit in 1977, smoked for about 13 years  Pt meds include: Statin : yes Betablocker: Yes ASA: Yes Other anticoagulants/antiplatelets: no   Past Medical History  Diagnosis Date  . Hypertension   . Hyperlipidemia   . History of atrial tachycardia CONTROLLED W/ LOPRESSOR  . Peripheral vascular disease (HCC) S/P RIGHT BKA  . Headache(784.0)   . Peripheral neuropathy (HCC) LEGS AND HANDS  . Arthritis   . 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 GERD (gastroesophageal reflux disease)   . Anemia   . Blood transfusion   . Diabetes mellitus ORAL AND INSULIN MEDS  . Macular degeneration of both eyes   . Glaucoma   . Retinopathy due to secondary diabetes mellitus ([place])   . Insomnia   . Constipation   . History of bladder cancer TCC AND CIS  . MI (mitral incompetence)   . Coronary artery disease   . Chronic kidney disease   . Cancer ([place])     bladder  . Myocardial infarction [facility])     Social History Social History  Substance Use Topics  . Smoking status: Former Smoker --  2.00 packs/day for 17 years    Types: Cigarettes    Quit date: 10/09/1975  . Smokeless tobacco: Never Used  . Alcohol Use: 0.0 oz/week    0 Standard drinks or equivalent per week     Comment: RARE    Family History Family History  Problem Relation Age of Onset  . Heart failure Mother     died age 30  . Sudden death Father 17  . Heart disease Father   . Hypertension Father   . Heart attack Father   . Diabetes [name]   . Heart attack [name]   . AAA (abdominal aortic aneurysm) [name]     Past Surgical History  Procedure Laterality Date  . I & d right below knee amputation wound  06-10-2011  . Cysto / resection bladder bx's  04-01-11  &  11-26-10  . Below knee leg amputation  05-02-2007    RIGHT  . Right foot i & d / removal necrotic bone  JUN  &  AUG 2008  . Right great toe amputation  11-05-2006  . Laparoscopic cholecystectomy  12-10-1999  . Back surgery  [place]  . Appendectomy  1963  . Cataract extraction w/ intraocular lens  implant, bilateral    . Cystoscopy with biopsy  10/14/2011    Procedure: CYSTOSCOPY WITH BIOPSY;  Surgeon: Elta Guadeloupe  Nedra Hai, MD;  Location: [facility];  Service: Urology;  Laterality: N/A;  BLADDER BIOPSY  . Coronary stent placement    . Abdominal hysterectomy    . Revision urostomy cutaneous    . Revision urostomy cutaneous    . Abdominal angiogram N/A 09/08/2014    Procedure: ABDOMINAL ANGIOGRAM;  Surgeon: Conrad Landess, MD;  Location: [facility] CATH LAB;  Service: Cardiovascular;  Laterality: N/A;  . Femoral-popliteal bypass graft Left 09/12/2014    Procedure:  LEFT FEMORAL-POPLITEAL ARTERY BYPASS GRAFT USING NON REVERSE LEFT GREATER SAPHENOUS VEIN;  Surgeon: Rosetta Posner, MD;  Location: [place];  Service: Vascular;  Laterality: Left;  . Amputation Left 09/14/2014    Procedure: LEFT FOOT FIRST RAY AMPUTATION;  Surgeon: Mcarthur Rossetti, MD;  Location: [place];  Service: Orthopedics;  Laterality: Left;    Allergies  Allergen  Reactions  . Clarithromycin Itching  . Lisinopril Other (See Comments)    Increased creatinine levels (pt currently taking low dose)  . Metformin And Related Other (See Comments)    Increased creatinine levels    Current Outpatient Prescriptions  Medication Sig Dispense Refill  . acetaminophen (TYLENOL) 500 MG tablet Take 1,000 mg by mouth every 6 (six) hours as needed.     Marland Kitchen aspirin 81 MG chewable tablet Chew 81 mg by mouth at bedtime.     Marland Kitchen atorvastatin (LIPITOR) 80 MG tablet TAKE 1 TABLET (80 MG TOTAL) BY MOUTH EVERY EVENING. 90 tablet 2  . furosemide (LASIX) 40 MG tablet Take 80 mg by mouth 3 (three) times daily.     . insulin glargine (LANTUS) 100 UNIT/ML injection Inject 55-60 Units into the skin 2 (two) times daily. 55 units in the morning and 60 units at night    . insulin lispro (HUMALOG) 100 UNIT/ML injection Inject 20-50 Units into the skin daily as needed for high blood sugar. PER SLIDING SCALE    . isosorbide mononitrate (IMDUR) 30 MG 24 hr tablet TAKE 1 TABLET (30 MG TOTAL) BY MOUTH DAILY. 30 tablet 7  . Melatonin 10 MG TABS Take 10 mg by mouth at bedtime.     . metoprolol succinate (TOPROL-XL) 100 MG 24 hr tablet TAKE 1 TABLET (100 MG TOTAL) BY MOUTH EVERY EVENING. TAKE WITH OR IMMEDIATELY FOLLOWING A MEAL. 90 tablet 2  . Multiple Vitamins-Minerals (PRESERVISION AREDS PO) Take 1 capsule by mouth 2 (two) times daily.    . nitroGLYCERIN (NITROSTAT) 0.4 MG SL tablet Place 0.4 mg under the tongue every 5 (five) minutes as needed for chest pain.    Marland Kitchen omeprazole (PRILOSEC) 20 MG capsule Take 20 mg by mouth daily as needed (gurd).     . polyethylene glycol (MIRALAX / GLYCOLAX) packet Take 17 g by mouth daily. 14 each 0  . potassium chloride SA (K-DUR,KLOR-CON) 20 MEQ tablet Take 20 mEq by mouth daily.     . Psyllium (VEGETABLE LAXATIVE PO) Take 4 tablets by mouth at bedtime.    . SODIUM BICARBONATE PO Take 20 g by mouth 2 (two) times daily.     No current facility-administered  medications for this visit.    ROS: See HPI for pertinent positives and negatives.   Physical Examination  Filed Vitals:   08/29/15 1453  BP: 100/50  Pulse: 92  Height: 5\' 3"  (1.6 m)  Weight: 195 lb (88.451 kg)  SpO2: 99%   Body mass index is 34.55 kg/(m^2).  General: A&O x 3, WDWN, obese female. Gait: slow, deliberate, using  cane and right BKA prosthesis Eyes: PERRLA. Pulmonary: CTAB but distant, without wheezes , rales or rhonchi. Cardiac: regular Rythm , without detected murmur.         Carotid Bruits Right Left   Negative Negative  Aorta is not palpable. Radial pulses: 2+ bilateral                           VASCULAR EXAM: Extremities without ischemic changes, without Gangrene; without open wounds. Right BKA with prosthesis in place. Left calf with chronic venous stasis dermatitis, no ulcers. Left great toe is surgically absent.                                                                                                          LE Pulses Right Left       FEMORAL  not palpable  not palpable        POPLITEAL  not palpable   not palpable       POSTERIOR TIBIAL  BKA   not palpable        DORSALIS PEDIS      ANTERIOR TIBIAL BKA  1+ palpable    Abdomen: soft, NT, no palpable masses. Urostomy with appliance in place. Skin: no rashes, see Extremities.. Musculoskeletal: Generalized deconditioning, see Extremities.  Neurologic: A&O X 3; Appropriate Affect ; SENSATION:diminished in left foot; MOTOR FUNCTION:  moving all extremities equally, motor strength 4/5 in upper extremities, 3/5 in lower extremities. Speech is fluent/normal. CN 2-12 intact.    Non-Invasive Vascular Imaging: DATE: 08/29/2015 ABI: Right: BKA. Left: >1.3, indicating tibial artery medial calcification. DP waveforms are triphasic, PT are monophasic. DUPLEX SCAN OF LEFT LE BYPASS:  Widely patent graft w/o significant stenosis or hyperplasia   ASSESSMENT: [name] is a 75 y.o. female who  is s/p left femoral to anterior tibial bypass in February 2016. She continues to have some changes consistent with venous stasis disease on her left calf, the ulcers have healed. She is completely healed her left toe amputation. She is s/p right BKA in 2008, she walks with a prosthesis and a cane. She states that she uses her w/c at home as she does not want to fall.  She does not walk enough to elicit claudication sx's other than when she goes to church. Today's left LE arterial duplex suggests a widely patent graft w/o significant stenosis or hyperplasia. Right BKA. Left ABI is falsely elevated secondary to calcified arteries, secondary to uncontrolled DM.  Chronic venous stasis dermatitis: left calf stasis ulcer has healed, dermatitis changes present. She is wearing her left knee high compression hose which she states has helped prevent more ulcers.   Face to face time with patient was 25 minutes. Over 50% of this time was spent on counseling and coordination of care.   PLAN:  Daily seated leg exercises discussed and demonstrated.  Based on the patient's vascular studies and examination, pt will return to clinic in 6 months with left ABI and LE arterial duplex.   I discussed  in depth with the patient the nature of atherosclerosis, and emphasized the importance of maximal medical management including strict control of blood pressure, blood glucose, and lipid levels, obtaining regular exercise, and continued cessation of smoking.  The patient is aware that without maximal medical management the underlying atherosclerotic disease process will progress, limiting the benefit of any interventions.  The patient was given information about PAD including signs, symptoms, treatment, what symptoms should prompt the patient to seek immediate medical care, and risk reduction measures to take.  Clemon Chambers, RN, MSN, FNP-C [facility] Phone: [phone]  Clinic  MD: Early  08/29/2015 3:06 PM

## 2015-08-29 NOTE — Patient Instructions (Signed)
Peripheral Vascular Disease Peripheral vascular disease (PVD) is a disease of the blood vessels that are not part of your heart and brain. A simple term for PVD is poor circulation. In most cases, PVD narrows the blood vessels that carry blood from your heart to the rest of your body. This can result in a decreased supply of blood to your arms, legs, and internal organs, like your stomach or kidneys. However, it most often affects a person's lower legs and feet. There are two types of PVD.  Organic PVD. This is the more common type. It is caused by damage to the structure of blood vessels.  Functional PVD. This is caused by conditions that make blood vessels contract and tighten (spasm). Without treatment, PVD tends to get worse over time. PVD can also lead to acute ischemic limb. This is when an arm or limb suddenly has trouble getting enough blood. This is a medical emergency. CAUSES Each type of PVD has many different causes. The most common cause of PVD is buildup of a fatty material (plaque) inside of your arteries (atherosclerosis). Small amounts of plaque can break off from the walls of the blood vessels and become lodged in a smaller artery. This blocks blood flow and can cause acute ischemic limb. Other common causes of PVD include:  Blood clots that form inside of blood vessels.  Injuries to blood vessels.  Diseases that cause inflammation of blood vessels or cause blood vessel spasms.  Health behaviors and health history that increase your risk of developing PVD. RISK FACTORS  You may have a greater risk of PVD if you:  Have a family history of PVD.  Have certain medical conditions, including:  High cholesterol.  Diabetes.  High blood pressure (hypertension).  Coronary heart disease.  Past problems with blood clots.  Past injury, such as burns or a broken bone. These may have damaged blood vessels in your limbs.  Buerger disease. This is caused by inflamed blood  vessels in your hands and feet.  Some forms of arthritis.  Rare birth defects that affect the arteries in your legs.  Use tobacco.  Do not get enough exercise.  Are obese.  Are age 50 or older. SIGNS AND SYMPTOMS  PVD may cause many different symptoms. Your symptoms depend on what part of your body is not getting enough blood. Some common signs and symptoms include:  Cramps in your lower legs. This may be a symptom of poor leg circulation (claudication).  Pain and weakness in your legs while you are physically active that goes away when you rest (intermittent claudication).  Leg pain when at rest.  Leg numbness, tingling, or weakness.  Coldness in a leg or foot, especially when compared with the other leg.  Skin or hair changes. These can include:  Hair loss.  Shiny skin.  Pale or bluish skin.  Thick toenails.  Inability to get or maintain an erection (erectile dysfunction). People with PVD are more prone to developing ulcers and sores on their toes, feet, or legs. These may take longer than normal to heal. DIAGNOSIS Your health care provider may diagnose PVD from your signs and symptoms. The health care provider will also do a physical exam. You may have tests to find out what is causing your PVD and determine its severity. Tests may include:  Blood pressure recordings from your arms and legs and measurements of the strength of your pulses (pulse volume recordings).  Imaging studies using sound waves to take pictures of   the blood flow through your blood vessels (Doppler ultrasound).  Injecting a dye into your blood vessels before having imaging studies using:  X-rays (angiogram or arteriogram).  Computer-generated X-rays (CT angiogram).  A powerful electromagnetic field and a computer (magnetic resonance angiogram or MRA). TREATMENT Treatment for PVD depends on the cause of your condition and the severity of your symptoms. It also depends on your age. Underlying  causes need to be treated and controlled. These include long-lasting (chronic) conditions, such as diabetes, high cholesterol, and high blood pressure. You may need to first try making lifestyle changes and taking medicines. Surgery may be needed if these do not work. Lifestyle changes may include:  Quitting smoking.  Exercising regularly.  Following a low-fat, low-cholesterol diet. Medicines may include:  Blood thinners to prevent blood clots.  Medicines to improve blood flow.  Medicines to improve your blood cholesterol levels. Surgical procedures may include:  A procedure that uses an inflated balloon to open a blocked artery and improve blood flow (angioplasty).  A procedure to put in a tube (stent) to keep a blocked artery open (stent implant).  Surgery to reroute blood flow around a blocked artery (peripheral bypass surgery).  Surgery to remove dead tissue from an infected wound on the affected limb.  Amputation. This is surgical removal of the affected limb. This may be necessary in cases of acute ischemic limb that are not improved through medical or surgical treatments. HOME CARE INSTRUCTIONS  Take medicines only as directed by your health care provider.  Do not use any tobacco products, including cigarettes, chewing tobacco, or electronic cigarettes. If you need help quitting, ask your health care provider.  Lose weight if you are overweight, and maintain a healthy weight as directed by your health care provider.  Eat a diet that is low in fat and cholesterol. If you need help, ask your health care provider.  Exercise regularly. Ask your health care provider to suggest some good activities for you.  Use compression stockings or other mechanical devices as directed by your health care provider.  Take good care of your feet.  Wear comfortable shoes that fit well.  Check your feet often for any cuts or sores. SEEK MEDICAL CARE IF:  You have cramps in your legs  while walking.  You have leg pain when you are at rest.  You have coldness in a leg or foot.  Your skin changes.  You have erectile dysfunction.  You have cuts or sores on your feet that are not healing. SEEK IMMEDIATE MEDICAL CARE IF:  Your arm or leg turns cold and blue.  Your arms or legs become red, warm, swollen, painful, or numb.  You have chest pain or trouble breathing.  You suddenly have weakness in your face, arm, or leg.  You become very confused or lose the ability to speak.  You suddenly have a very bad headache or lose your vision.   This information is not intended to replace advice given to you by your health care provider. Make sure you discuss any questions you have with your health care provider.   Document Released: 09/05/2004 Document Revised: 08/19/2014 Document Reviewed: 01/06/2014 Elsevier Interactive Patient Education 2016 [employer]  

## 2015-08-30 NOTE — Addendum Note (Signed)
Addended by: Dorthula Rue L on: 08/30/2015 04:57 PM   Modules accepted: Orders

## 2015-09-19 DIAGNOSIS — D090 Carcinoma in situ of bladder: Secondary | ICD-10-CM | POA: Diagnosis not present

## 2015-09-19 DIAGNOSIS — Z8551 Personal history of malignant neoplasm of bladder: Secondary | ICD-10-CM | POA: Diagnosis not present

## 2015-09-19 DIAGNOSIS — Z0000 Encounter for general adult medical examination without abnormal findings: Secondary | ICD-10-CM | POA: Diagnosis not present

## 2015-09-27 DIAGNOSIS — H1010 Acute atopic conjunctivitis, unspecified eye: Secondary | ICD-10-CM | POA: Diagnosis not present

## 2015-10-05 ENCOUNTER — Other Ambulatory Visit: Payer: Self-pay | Admitting: Urology

## 2015-10-05 ENCOUNTER — Ambulatory Visit (HOSPITAL_COMMUNITY)
Admission: RE | Admit: 2015-10-05 | Discharge: 2015-10-05 | Disposition: A | Discharge status: Home / Self Care | Payer: PPO | Source: Ambulatory Visit | Attending: Urology | Admitting: Urology

## 2015-10-05 DIAGNOSIS — D090 Carcinoma in situ of bladder: Secondary | ICD-10-CM | POA: Insufficient documentation

## 2015-10-05 DIAGNOSIS — Z906 Acquired absence of other parts of urinary tract: Secondary | ICD-10-CM | POA: Diagnosis not present

## 2015-10-05 DIAGNOSIS — Z0389 Encounter for observation for other suspected diseases and conditions ruled out: Secondary | ICD-10-CM | POA: Diagnosis not present

## 2015-10-10 DIAGNOSIS — Z23 Encounter for immunization: Secondary | ICD-10-CM | POA: Diagnosis not present

## 2015-10-10 DIAGNOSIS — I129 Hypertensive chronic kidney disease with stage 1 through stage 4 chronic kidney disease, or unspecified chronic kidney disease: Secondary | ICD-10-CM | POA: Diagnosis not present

## 2015-10-10 DIAGNOSIS — D631 Anemia in chronic kidney disease: Secondary | ICD-10-CM | POA: Diagnosis not present

## 2015-10-10 DIAGNOSIS — N185 Chronic kidney disease, stage 5: Secondary | ICD-10-CM | POA: Diagnosis not present

## 2015-10-10 DIAGNOSIS — N2581 Secondary hyperparathyroidism of renal origin: Secondary | ICD-10-CM | POA: Diagnosis not present

## 2015-10-10 DIAGNOSIS — E118 Type 2 diabetes mellitus with unspecified complications: Secondary | ICD-10-CM | POA: Diagnosis not present

## 2015-10-10 DIAGNOSIS — N183 Chronic kidney disease, stage 3 (moderate): Secondary | ICD-10-CM | POA: Diagnosis not present

## 2015-10-20 DIAGNOSIS — D090 Carcinoma in situ of bladder: Secondary | ICD-10-CM | POA: Diagnosis not present

## 2015-10-31 DIAGNOSIS — I129 Hypertensive chronic kidney disease with stage 1 through stage 4 chronic kidney disease, or unspecified chronic kidney disease: Secondary | ICD-10-CM | POA: Diagnosis not present

## 2015-11-13 DIAGNOSIS — Z8551 Personal history of malignant neoplasm of bladder: Secondary | ICD-10-CM | POA: Diagnosis not present

## 2015-11-13 DIAGNOSIS — E1165 Type 2 diabetes mellitus with hyperglycemia: Secondary | ICD-10-CM | POA: Diagnosis not present

## 2015-11-13 DIAGNOSIS — E11621 Type 2 diabetes mellitus with foot ulcer: Secondary | ICD-10-CM | POA: Diagnosis not present

## 2015-11-13 DIAGNOSIS — E782 Mixed hyperlipidemia: Secondary | ICD-10-CM | POA: Diagnosis not present

## 2015-11-13 DIAGNOSIS — N183 Chronic kidney disease, stage 3 (moderate): Secondary | ICD-10-CM | POA: Diagnosis not present

## 2015-11-13 DIAGNOSIS — E1159 Type 2 diabetes mellitus with other circulatory complications: Secondary | ICD-10-CM | POA: Diagnosis not present

## 2015-11-13 DIAGNOSIS — E1322 Other specified diabetes mellitus with diabetic chronic kidney disease: Secondary | ICD-10-CM | POA: Diagnosis not present

## 2015-11-13 DIAGNOSIS — E119 Type 2 diabetes mellitus without complications: Secondary | ICD-10-CM | POA: Diagnosis not present

## 2015-11-13 DIAGNOSIS — M791 Myalgia: Secondary | ICD-10-CM | POA: Diagnosis not present

## 2015-11-13 DIAGNOSIS — Z89511 Acquired absence of right leg below knee: Secondary | ICD-10-CM | POA: Diagnosis not present

## 2015-11-13 DIAGNOSIS — I10 Essential (primary) hypertension: Secondary | ICD-10-CM | POA: Diagnosis not present

## 2015-11-14 ENCOUNTER — Emergency Department (HOSPITAL_COMMUNITY)
Admission: EM | Admit: 2015-11-14 | Discharge: 2015-11-15 | Disposition: A | Discharge status: Home / Self Care | Payer: PPO | Attending: Emergency Medicine | Admitting: Emergency Medicine

## 2015-11-14 ENCOUNTER — Emergency Department (HOSPITAL_COMMUNITY): Payer: PPO

## 2015-11-14 ENCOUNTER — Encounter (HOSPITAL_COMMUNITY): Payer: Self-pay

## 2015-11-14 DIAGNOSIS — R079 Chest pain, unspecified: Secondary | ICD-10-CM | POA: Insufficient documentation

## 2015-11-14 DIAGNOSIS — Z87891 Personal history of nicotine dependence: Secondary | ICD-10-CM | POA: Insufficient documentation

## 2015-11-14 DIAGNOSIS — K5900 Constipation, unspecified: Secondary | ICD-10-CM | POA: Diagnosis not present

## 2015-11-14 DIAGNOSIS — N390 Urinary tract infection, site not specified: Secondary | ICD-10-CM | POA: Diagnosis not present

## 2015-11-14 DIAGNOSIS — E1165 Type 2 diabetes mellitus with hyperglycemia: Secondary | ICD-10-CM | POA: Diagnosis not present

## 2015-11-14 DIAGNOSIS — E785 Hyperlipidemia, unspecified: Secondary | ICD-10-CM | POA: Insufficient documentation

## 2015-11-14 DIAGNOSIS — N189 Chronic kidney disease, unspecified: Secondary | ICD-10-CM | POA: Insufficient documentation

## 2015-11-14 DIAGNOSIS — I2510 Atherosclerotic heart disease of native coronary artery without angina pectoris: Secondary | ICD-10-CM | POA: Insufficient documentation

## 2015-11-14 DIAGNOSIS — M546 Pain in thoracic spine: Secondary | ICD-10-CM | POA: Insufficient documentation

## 2015-11-14 DIAGNOSIS — Z79899 Other long term (current) drug therapy: Secondary | ICD-10-CM | POA: Diagnosis not present

## 2015-11-14 DIAGNOSIS — R0602 Shortness of breath: Secondary | ICD-10-CM | POA: Diagnosis not present

## 2015-11-14 DIAGNOSIS — I129 Hypertensive chronic kidney disease with stage 1 through stage 4 chronic kidney disease, or unspecified chronic kidney disease: Secondary | ICD-10-CM | POA: Diagnosis not present

## 2015-11-14 DIAGNOSIS — Z7982 Long term (current) use of aspirin: Secondary | ICD-10-CM | POA: Insufficient documentation

## 2015-11-14 DIAGNOSIS — R0789 Other chest pain: Secondary | ICD-10-CM | POA: Diagnosis not present

## 2015-11-14 DIAGNOSIS — E13319 Other specified diabetes mellitus with unspecified diabetic retinopathy without macular edema: Secondary | ICD-10-CM | POA: Insufficient documentation

## 2015-11-14 DIAGNOSIS — Z8669 Personal history of other diseases of the nervous system and sense organs: Secondary | ICD-10-CM | POA: Diagnosis not present

## 2015-11-14 DIAGNOSIS — Z955 Presence of coronary angioplasty implant and graft: Secondary | ICD-10-CM | POA: Diagnosis not present

## 2015-11-14 DIAGNOSIS — R1013 Epigastric pain: Secondary | ICD-10-CM | POA: Insufficient documentation

## 2015-11-14 DIAGNOSIS — Z794 Long term (current) use of insulin: Secondary | ICD-10-CM | POA: Insufficient documentation

## 2015-11-14 DIAGNOSIS — M542 Cervicalgia: Secondary | ICD-10-CM | POA: Insufficient documentation

## 2015-11-14 DIAGNOSIS — K219 Gastro-esophageal reflux disease without esophagitis: Secondary | ICD-10-CM | POA: Insufficient documentation

## 2015-11-14 DIAGNOSIS — Z9119 Patient's noncompliance with other medical treatment and regimen: Secondary | ICD-10-CM | POA: Diagnosis not present

## 2015-11-14 DIAGNOSIS — Z862 Personal history of diseases of the blood and blood-forming organs and certain disorders involving the immune mechanism: Secondary | ICD-10-CM | POA: Diagnosis not present

## 2015-11-14 DIAGNOSIS — I252 Old myocardial infarction: Secondary | ICD-10-CM | POA: Insufficient documentation

## 2015-11-14 DIAGNOSIS — M1990 Unspecified osteoarthritis, unspecified site: Secondary | ICD-10-CM | POA: Insufficient documentation

## 2015-11-14 DIAGNOSIS — Z8551 Personal history of malignant neoplasm of bladder: Secondary | ICD-10-CM | POA: Diagnosis not present

## 2015-11-14 HISTORY — DX: Other artificial openings of urinary tract status: Z93.6

## 2015-11-14 LAB — LIPASE, BLOOD: LIPASE: 30 U/L (ref 11–51)

## 2015-11-14 LAB — BASIC METABOLIC PANEL
Anion gap: 12 (ref 5–15)
BUN: 45 mg/dL — AB (ref 6–20)
CO2: 22 mmol/L (ref 22–32)
CREATININE: 1.61 mg/dL — AB (ref 0.44–1.00)
Calcium: 9.8 mg/dL (ref 8.9–10.3)
Chloride: 98 mmol/L — ABNORMAL LOW (ref 101–111)
GFR calc Af Amer: 35 mL/min — ABNORMAL LOW (ref >60)
GFR, EST NON AFRICAN AMERICAN: 30 mL/min — AB (ref >60)
Glucose, Bld: 566 mg/dL (ref 65–99)
Potassium: 4.7 mmol/L (ref 3.5–5.1)
SODIUM: 132 mmol/L — AB (ref 135–145)

## 2015-11-14 LAB — CBC
HCT: 38.8 % (ref 36.0–46.0)
Hemoglobin: 12.6 g/dL (ref 12.0–15.0)
MCH: 26.9 pg (ref 26.0–34.0)
MCHC: 32.5 g/dL (ref 30.0–36.0)
MCV: 82.9 fL (ref 78.0–100.0)
PLATELETS: 208 10*3/uL (ref 150–400)
RBC: 4.68 MIL/uL (ref 3.87–5.11)
RDW: 13.8 % (ref 11.5–15.5)
WBC: 7.6 10*3/uL (ref 4.0–10.5)

## 2015-11-14 LAB — CBG MONITORING, ED: Glucose-Capillary: 454 mg/dL — ABNORMAL HIGH (ref 65–99)

## 2015-11-14 LAB — HEPATIC FUNCTION PANEL
ALT: 27 U/L (ref 14–54)
AST: 19 U/L (ref 15–41)
Albumin: 3.3 g/dL — ABNORMAL LOW (ref 3.5–5.0)
Alkaline Phosphatase: 142 U/L — ABNORMAL HIGH (ref 38–126)
BILIRUBIN DIRECT: 0.1 mg/dL (ref 0.1–0.5)
BILIRUBIN INDIRECT: 0.6 mg/dL (ref 0.3–0.9)
BILIRUBIN TOTAL: 0.7 mg/dL (ref 0.3–1.2)
Total Protein: 7 g/dL (ref 6.5–8.1)

## 2015-11-14 LAB — I-STAT TROPONIN, ED: Troponin i, poc: 0 ng/mL (ref 0.00–0.08)

## 2015-11-14 MED ORDER — MORPHINE SULFATE (PF) 4 MG/ML IV SOLN
4.0000 mg | Freq: Once | INTRAVENOUS | Status: AC
  Administered 2015-11-14: 4 mg via INTRAVENOUS
  Filled 2015-11-14: qty 1

## 2015-11-14 MED ORDER — INSULIN GLARGINE 100 UNIT/ML ~~LOC~~ SOLN
60.0000 [IU] | Freq: Once | SUBCUTANEOUS | Status: AC
  Administered 2015-11-14: 60 [IU] via SUBCUTANEOUS
  Filled 2015-11-14: qty 0.6

## 2015-11-14 MED ORDER — PANTOPRAZOLE SODIUM 40 MG IV SOLR
40.0000 mg | Freq: Once | INTRAVENOUS | Status: AC
  Administered 2015-11-14: 40 mg via INTRAVENOUS
  Filled 2015-11-14: qty 40

## 2015-11-14 MED ORDER — SODIUM CHLORIDE 0.9 % IV SOLN
INTRAVENOUS | Status: DC
  Administered 2015-11-14: 22:00:00 via INTRAVENOUS

## 2015-11-14 MED ORDER — INSULIN ASPART 100 UNIT/ML ~~LOC~~ SOLN
15.0000 [IU] | Freq: Once | SUBCUTANEOUS | Status: AC
  Administered 2015-11-14: 15 [IU] via SUBCUTANEOUS
  Filled 2015-11-14: qty 1

## 2015-11-14 NOTE — ED Provider Notes (Signed)
CSN: [id number]     Arrival date & time 11/14/15  2006 History   First MD Initiated Contact with Patient 11/14/15 2050     Chief Complaint  Patient presents with  . Chest Pain     (Consider location/radiation/quality/duration/timing/severity/associated sxs/prior Treatment) HPI Patient had pain that started yesterday. He reports the pain is in her thoracic back, the right side of her neck, her chest with radiation into the left arm. She reports it feels achy and very uncomfortable. She states that yesterday evening she couldn't sleep most of the night. This morning she slept for a little bit and then called her doctor's office. She reports the advised her to come to the emergency department for further assessment. She denies specifically feeling short of breath. She does however report that taking a breath makes it feel worse and it feels like the air is really cold going in. She reports it doesn't feel quite like prior heart attack, but she was worried because her doctor was concerned as well. Reports she continues to have pain. Nothing has relieved it. Patient poor she has a history of several ruptured disks in the past. She is wondering if maybe she has had a problem again with her disc compression. She does not think that the symptoms feel like her heart. Past Medical History  Diagnosis Date  . Hypertension   . Hyperlipidemia   . History of atrial tachycardia CONTROLLED W/ LOPRESSOR  . Peripheral vascular disease (HCC) S/P RIGHT BKA  . Headache(784.0)   . Peripheral neuropathy (HCC) LEGS AND HANDS  . Arthritis   . 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 GERD (gastroesophageal reflux disease)   . Anemia   . Blood transfusion   . Diabetes mellitus ORAL AND INSULIN MEDS  . Macular degeneration of both eyes   . Glaucoma   . Retinopathy due to secondary diabetes mellitus ([place])   . Insomnia   . Constipation   . History of bladder cancer TCC AND CIS  . MI (mitral  incompetence)   . Coronary artery disease   . Chronic kidney disease   . Cancer ([place])     bladder  . Myocardial infarction ([place])   . Presence of urostomy [facility])    Past Surgical History  Procedure Laterality Date  . I & d right below knee amputation wound  06-10-2011  . Cysto / resection bladder bx's  04-01-11  &  11-26-10  . Below knee leg amputation  05-02-2007    RIGHT  . Right foot i & d / removal necrotic bone  JUN  &  AUG 2008  . Right great toe amputation  11-05-2006  . Laparoscopic cholecystectomy  12-10-1999  . Back surgery  [place]  . Appendectomy  1963  . Cataract extraction w/ intraocular lens  implant, bilateral    . Cystoscopy with biopsy  10/14/2011    Procedure: CYSTOSCOPY WITH BIOPSY;  Surgeon: Claybon Jabs, MD;  Location: [facility];  Service: Urology;  Laterality: N/A;  BLADDER BIOPSY  . Coronary stent placement    . Abdominal hysterectomy    . Revision urostomy cutaneous    . Revision urostomy cutaneous    . Abdominal angiogram N/A 09/08/2014    Procedure: ABDOMINAL ANGIOGRAM;  Surgeon: Conrad Dunkirk, MD;  Location: [facility] CATH [facility];  Service: Cardiovascular;  Laterality: N/A;  . Femoral-popliteal bypass graft Left 09/12/2014    Procedure:  LEFT FEMORAL-POPLITEAL ARTERY BYPASS GRAFT  USING NON REVERSE LEFT GREATER SAPHENOUS VEIN;  Surgeon: Rosetta Posner, MD;  Location: [place];  Service: Vascular;  Laterality: Left;  . Amputation Left 09/14/2014    Procedure: LEFT FOOT FIRST RAY AMPUTATION;  Surgeon: Mcarthur Rossetti, MD;  Location: [place];  Service: Orthopedics;  Laterality: Left;   Family History  Problem Relation Age of Onset  . Heart failure Mother     died age 55  . Sudden death Father 55  . Heart disease Father   . Hypertension Father   . Heart attack Father   . Diabetes Brother   . Heart attack Brother   . AAA (abdominal aortic aneurysm) Brother    Social History  Substance Use Topics  . Smoking status: Former Smoker -- 2.00  packs/day for 17 years    Types: Cigarettes    Quit date: 10/09/1975  . Smokeless tobacco: Never Used  . Alcohol Use: 0.0 oz/week    0 Standard drinks or equivalent per week     Comment: RARE   OB History    No data available     Review of Systems 10 Systems reviewed and are negative for acute change except as noted in the HPI.    Allergies  Clarithromycin; Lisinopril; and Metformin and related  Home Medications   Prior to Admission medications   Medication Sig Start Date End Date Taking? Authorizing Provider  acetaminophen (TYLENOL) 500 MG tablet Take 1,000 mg by mouth every 6 (six) hours as needed for mild pain.    Yes Historical Provider, MD  aspirin 81 MG chewable tablet Chew 81 mg by mouth at bedtime.    Yes Historical Provider, MD  atorvastatin (LIPITOR) 80 MG tablet TAKE 1 TABLET (80 MG TOTAL) BY MOUTH EVERY EVENING. 06/08/15  Yes Jerline Pain, MD  furosemide (LASIX) 40 MG tablet Take 80 mg by mouth 3 (three) times daily.  08/16/13  Yes Historical Provider, MD  insulin glargine (LANTUS) 100 UNIT/ML injection Inject 55-60 Units into the skin 2 (two) times daily. 55 units in the morning and 60 units at night   Yes Historical Provider, MD  insulin lispro (HUMALOG) 100 UNIT/ML injection Inject 20-50 Units into the skin daily as needed for high blood sugar. PER SLIDING SCALE   Yes Historical Provider, MD  isosorbide mononitrate (IMDUR) 30 MG 24 hr tablet TAKE 1 TABLET (30 MG TOTAL) BY MOUTH DAILY. 08/24/15  Yes Jerline Pain, MD  Melatonin 10 MG TABS Take 10 mg by mouth at bedtime.    Yes Historical Provider, MD  metoprolol succinate (TOPROL-XL) 100 MG 24 hr tablet TAKE 1 TABLET (100 MG TOTAL) BY MOUTH EVERY EVENING. TAKE WITH OR IMMEDIATELY FOLLOWING A MEAL. 06/08/15  Yes Jerline Pain, MD  Multiple Vitamins-Minerals (PRESERVISION AREDS PO) Take 1 capsule by mouth 2 (two) times daily.   Yes Historical Provider, MD  nitroGLYCERIN (NITROSTAT) 0.4 MG SL tablet Place 0.4 mg under the  tongue every 5 (five) minutes as needed for chest pain.   Yes Historical Provider, MD  omeprazole (PRILOSEC) 20 MG capsule Take 20 mg by mouth daily as needed (gurd).    Yes Historical Provider, MD  polyethylene glycol (MIRALAX / GLYCOLAX) packet Take 17 g by mouth daily. 06/08/13  Yes Pamella Pert, MD  potassium chloride SA (K-DUR,KLOR-CON) 20 MEQ tablet Take 20 mEq by mouth daily.  08/16/13  Yes Historical Provider, MD  Psyllium (VEGETABLE LAXATIVE PO) Take 4 tablets by mouth at bedtime.   Yes Historical Provider,  MD  SODIUM BICARBONATE PO Take 20 g by mouth 2 (two) times daily.   Yes Historical Provider, MD  HYDROcodone-acetaminophen (NORCO/VICODIN) 5-325 MG tablet Take 1-2 tablets by mouth every 4 (four) hours as needed for moderate pain or severe pain. 11/15/15   Charlesetta Shanks, MD   BP 124/65 mmHg  Pulse 98  Temp(Src) 98.1 F (36.7 C) (Oral)  Resp 12  Ht 5\' 2"  (1.575 m)  Wt 198 lb (89.812 kg)  BMI 36.21 kg/m2  SpO2 97% Physical Exam  Constitutional: She is oriented to person, place, and time. She appears well-developed and well-nourished.  HENT:  Head: Normocephalic and atraumatic.  Eyes: EOM are normal. Pupils are equal, round, and reactive to light.  Neck: Neck supple.  Cardiovascular: Normal rate, regular rhythm, normal heart sounds and intact distal pulses.   Pulmonary/Chest: Effort normal and breath sounds normal. No respiratory distress. She has no wheezes. She has no rales.  Abdominal: Soft. Bowel sounds are normal. She exhibits no distension. There is tenderness.  Very mild high epigastric pain no guarding no rebound no palpable mass.  Musculoskeletal: Normal range of motion. She exhibits no edema or tenderness.  A she has prosthetic right lower extremity. Lower extremity no calf tenderness.  Neurological: She is alert and oriented to person, place, and time. She has normal strength. Coordination normal. GCS eye subscore is 4. GCS verbal subscore is 5. GCS motor subscore is  6.  Skin: Skin is warm, dry and intact.  Psychiatric: She has a normal mood and affect.    ED Course  Procedures (including critical care time) Labs Review Labs Reviewed  BASIC METABOLIC PANEL - Abnormal; Notable for the following:    Sodium 132 (*)    Chloride 98 (*)    Glucose, Bld 566 (*)    BUN 45 (*)    Creatinine, Ser 1.61 (*)    GFR calc non Af Amer 30 (*)    GFR calc Af Amer 35 (*)    All other components within normal limits  HEPATIC FUNCTION PANEL - Abnormal; Notable for the following:    Albumin 3.3 (*)    Alkaline Phosphatase 142 (*)    All other components within normal limits  CBG MONITORING, ED - Abnormal; Notable for the following:    Glucose-Capillary 454 (*)    All other components within normal limits  CBC  LIPASE, BLOOD  I-STAT TROPOININ, ED    Imaging Review Dg Chest 2 View  11/14/2015  CLINICAL DATA:  Chest pain and shortness of breath for 2 days. Initial encounter. EXAM: CHEST  2 VIEW COMPARISON:  PA and lateral chest 10/05/2015 and 11/14/2012. FINDINGS: The lungs are clear. Heart size is normal. No pneumothorax or pleural effusion. Remote left rib fractures are noted. IMPRESSION: No acute disease. Electronically Signed   By: Inge Rise M.D.   On: 11/14/2015 21:20   Ct Chest Wo Contrast  11/15/2015  CLINICAL DATA:  Chest pain onset today. EXAM: CT CHEST WITHOUT CONTRAST TECHNIQUE: Multidetector CT imaging of the chest was performed following the standard protocol without IV contrast. COMPARISON:  Radiograph 3 hours prior. Included lung bases from CT abdomen/pelvis 10/05/2015 and 06/08/2013. FINDINGS: Mediastinum/Lymph Nodes: Atherosclerotic calcifications of normal caliber thoracic aorta. Coronary artery calcifications versus coronary stent. Heart is normal in size. No pericardial effusion. No mediastinal or evidence of hilar adenopathy on noncontrast exam. There is a 3.5 cm hypodense nodule in the lower right thyroid gland extending to the isthmus.  Lungs/Pleura: No consolidation. No  pulmonary edema. Trachea and mainstem bronchi are patent. High-density tree and bud opacities in the right lower lobe, unchanged from prior exams dating back to 2014. Tiny subpleural nodules in the left lower lobe also unchanged dating back to 2014. Upper abdomen: No acute abnormality. Thinning of the included renal parenchyma. Pancreatic atrophy noted. Stomach distended with ingested contents. Musculoskeletal: Multilevel degenerative change throughout spine. Diffuse bony under mineralization. Remote left rib fractures. There are no acute or suspicious osseous abnormalities. IMPRESSION: 1. No acute intrathoracic process. 2. Aortic atherosclerosis, coronary artery calcifications versus stents. 3. Thyroid nodule on the right/isthmus measures 3.5 cm. Recommend nonemergent thyroid ultrasound. 4. Chronic right lung base opacities, unchanged dating back to 2014 and considered benign. This may be secondary to aspirated barium. Electronically Signed   By: Jeb Levering M.D.   On: 11/15/2015 00:36   I have personally reviewed and evaluated these images and lab results as part of my medical decision-making.   EKG Interpretation   Date/Time:  Tuesday November 14 2015 20:17:36 EDT Ventricular Rate:  95 PR Interval:  154 QRS Duration: 74 QT Interval:  352 QTC Calculation: 442 R Axis:   66 Text Interpretation:  Normal sinus rhythm Possible Anterior infarct , age  undetermined Abnormal ECG no STEMI Confirmed by Johnney Killian, MD, Jeannie Done  [phone]) on 11/14/2015 9:14:04 PM      MDM   Final diagnoses:  Chest pain  Midline thoracic back pain    At this time I feel that pain is of lower probability to represent cardiac ischemic discomfort as it has now been ongoing for over 24 hours without waxing waning and patient continues to have stable vital signs without positive troponin or acute EKG changes. Pain does have a muscular quality involving her trapezius and general aching around  the back of her neck and shoulders. Patient regularly takes omeprazole. She will be given Vicodin to take as needed. She for she has taken this in the past without difficulty. She will be following up with orthopedics tomorrow with an already scheduled. She is also counseled to follow-up with her family doctor for recheck.    Charlesetta Shanks, MD 11/15/15 [phone]

## 2015-11-14 NOTE — ED Notes (Addendum)
Katie, EMT, reported that pt's blood sugar is 454.

## 2015-11-14 NOTE — ED Notes (Signed)
Onset yesterday neck pain, worse on right side.  Today pain radiating down into chest and left arm and intermittant right jaw pain.  Pt c/o difficulty taking deep breath, feels like cold air breathing in.

## 2015-11-14 NOTE — ED Notes (Signed)
Notified by lab that this pt's glucose level is 566. Dr. Johnney Killian notified.

## 2015-11-15 ENCOUNTER — Emergency Department (HOSPITAL_COMMUNITY)
Admission: EM | Admit: 2015-11-15 | Discharge: 2015-11-15 | Disposition: A | Discharge status: Home / Self Care | Payer: PPO | Attending: Emergency Medicine | Admitting: Emergency Medicine

## 2015-11-15 ENCOUNTER — Emergency Department (HOSPITAL_COMMUNITY): Payer: PPO

## 2015-11-15 ENCOUNTER — Encounter (HOSPITAL_COMMUNITY): Payer: Self-pay

## 2015-11-15 DIAGNOSIS — Z794 Long term (current) use of insulin: Secondary | ICD-10-CM | POA: Insufficient documentation

## 2015-11-15 DIAGNOSIS — I2510 Atherosclerotic heart disease of native coronary artery without angina pectoris: Secondary | ICD-10-CM | POA: Insufficient documentation

## 2015-11-15 DIAGNOSIS — Z8551 Personal history of malignant neoplasm of bladder: Secondary | ICD-10-CM | POA: Diagnosis not present

## 2015-11-15 DIAGNOSIS — I129 Hypertensive chronic kidney disease with stage 1 through stage 4 chronic kidney disease, or unspecified chronic kidney disease: Secondary | ICD-10-CM | POA: Diagnosis not present

## 2015-11-15 DIAGNOSIS — Z9119 Patient's noncompliance with other medical treatment and regimen: Secondary | ICD-10-CM | POA: Diagnosis not present

## 2015-11-15 DIAGNOSIS — N390 Urinary tract infection, site not specified: Secondary | ICD-10-CM | POA: Diagnosis not present

## 2015-11-15 DIAGNOSIS — Z87891 Personal history of nicotine dependence: Secondary | ICD-10-CM | POA: Diagnosis not present

## 2015-11-15 DIAGNOSIS — M1990 Unspecified osteoarthritis, unspecified site: Secondary | ICD-10-CM | POA: Insufficient documentation

## 2015-11-15 DIAGNOSIS — K219 Gastro-esophageal reflux disease without esophagitis: Secondary | ICD-10-CM | POA: Insufficient documentation

## 2015-11-15 DIAGNOSIS — Z862 Personal history of diseases of the blood and blood-forming organs and certain disorders involving the immune mechanism: Secondary | ICD-10-CM | POA: Insufficient documentation

## 2015-11-15 DIAGNOSIS — R079 Chest pain, unspecified: Secondary | ICD-10-CM | POA: Diagnosis not present

## 2015-11-15 DIAGNOSIS — M542 Cervicalgia: Secondary | ICD-10-CM | POA: Insufficient documentation

## 2015-11-15 DIAGNOSIS — I252 Old myocardial infarction: Secondary | ICD-10-CM | POA: Diagnosis not present

## 2015-11-15 DIAGNOSIS — N189 Chronic kidney disease, unspecified: Secondary | ICD-10-CM | POA: Insufficient documentation

## 2015-11-15 DIAGNOSIS — K5900 Constipation, unspecified: Secondary | ICD-10-CM | POA: Diagnosis not present

## 2015-11-15 DIAGNOSIS — Z7982 Long term (current) use of aspirin: Secondary | ICD-10-CM | POA: Diagnosis not present

## 2015-11-15 DIAGNOSIS — Z8669 Personal history of other diseases of the nervous system and sense organs: Secondary | ICD-10-CM | POA: Insufficient documentation

## 2015-11-15 DIAGNOSIS — Z9114 Patient's other noncompliance with medication regimen: Secondary | ICD-10-CM

## 2015-11-15 DIAGNOSIS — R52 Pain, unspecified: Secondary | ICD-10-CM | POA: Diagnosis not present

## 2015-11-15 DIAGNOSIS — Z79899 Other long term (current) drug therapy: Secondary | ICD-10-CM | POA: Diagnosis not present

## 2015-11-15 DIAGNOSIS — E1165 Type 2 diabetes mellitus with hyperglycemia: Secondary | ICD-10-CM | POA: Insufficient documentation

## 2015-11-15 DIAGNOSIS — E785 Hyperlipidemia, unspecified: Secondary | ICD-10-CM | POA: Diagnosis not present

## 2015-11-15 LAB — I-STAT CHEM 8, ED
BUN: 42 mg/dL — ABNORMAL HIGH (ref 6–20)
CREATININE: 1.3 mg/dL — AB (ref 0.44–1.00)
Calcium, Ion: 1.22 mmol/L (ref 1.13–1.30)
Chloride: 102 mmol/L (ref 101–111)
GLUCOSE: 415 mg/dL — AB (ref 65–99)
HEMATOCRIT: 33 % — AB (ref 36.0–46.0)
HEMOGLOBIN: 11.2 g/dL — AB (ref 12.0–15.0)
Potassium: 5 mmol/L (ref 3.5–5.1)
Sodium: 137 mmol/L (ref 135–145)
TCO2: 22 mmol/L (ref 0–100)

## 2015-11-15 LAB — CBC WITH DIFFERENTIAL/PLATELET
BASOS PCT: 0 %
Basophils Absolute: 0 10*3/uL (ref 0.0–0.1)
Eosinophils Absolute: 0 10*3/uL (ref 0.0–0.7)
Eosinophils Relative: 0 %
HEMATOCRIT: 37.8 % (ref 36.0–46.0)
HEMOGLOBIN: 12 g/dL (ref 12.0–15.0)
LYMPHS ABS: 0.9 10*3/uL (ref 0.7–4.0)
LYMPHS PCT: 8 %
MCH: 26.2 pg (ref 26.0–34.0)
MCHC: 31.7 g/dL (ref 30.0–36.0)
MCV: 82.5 fL (ref 78.0–100.0)
MONOS PCT: 6 %
Monocytes Absolute: 0.7 10*3/uL (ref 0.1–1.0)
NEUTROS ABS: 10.7 10*3/uL — AB (ref 1.7–7.7)
NEUTROS PCT: 86 %
Platelets: 189 10*3/uL (ref 150–400)
RBC: 4.58 MIL/uL (ref 3.87–5.11)
RDW: 13.7 % (ref 11.5–15.5)
WBC: 12.4 10*3/uL — ABNORMAL HIGH (ref 4.0–10.5)

## 2015-11-15 LAB — I-STAT VENOUS BLOOD GAS, ED
Acid-base deficit: 2 mmol/L (ref 0.0–2.0)
Bicarbonate: 25.2 mEq/L — ABNORMAL HIGH (ref 20.0–24.0)
O2 SAT: 43 %
PCO2 VEN: 52.3 mmHg — AB (ref 45.0–50.0)
PO2 VEN: 27 mmHg — AB (ref 31.0–45.0)
TCO2: 27 mmol/L (ref 0–100)
pH, Ven: 7.29 (ref 7.250–7.300)

## 2015-11-15 LAB — CBG MONITORING, ED: GLUCOSE-CAPILLARY: 358 mg/dL — AB (ref 65–99)

## 2015-11-15 LAB — BASIC METABOLIC PANEL
ANION GAP: 11 (ref 5–15)
BUN: 43 mg/dL — ABNORMAL HIGH (ref 6–20)
CHLORIDE: 100 mmol/L — AB (ref 101–111)
CO2: 21 mmol/L — AB (ref 22–32)
Calcium: 9.6 mg/dL (ref 8.9–10.3)
Creatinine, Ser: 1.52 mg/dL — ABNORMAL HIGH (ref 0.44–1.00)
GFR calc non Af Amer: 33 mL/min — ABNORMAL LOW (ref >60)
GFR, EST AFRICAN AMERICAN: 38 mL/min — AB (ref >60)
Glucose, Bld: 480 mg/dL — ABNORMAL HIGH (ref 65–99)
POTASSIUM: 5.5 mmol/L — AB (ref 3.5–5.1)
Sodium: 132 mmol/L — ABNORMAL LOW (ref 135–145)

## 2015-11-15 LAB — URINALYSIS, ROUTINE W REFLEX MICROSCOPIC
Bilirubin Urine: NEGATIVE
Glucose, UA: 100 mg/dL — AB
Ketones, ur: 15 mg/dL — AB
NITRITE: NEGATIVE
Specific Gravity, Urine: 1.017 (ref 1.005–1.030)
pH: 7 (ref 5.0–8.0)

## 2015-11-15 LAB — URINE MICROSCOPIC-ADD ON

## 2015-11-15 LAB — I-STAT CG4 LACTIC ACID, ED: Lactic Acid, Venous: 1.5 mmol/L (ref 0.5–2.0)

## 2015-11-15 MED ORDER — INSULIN GLARGINE 100 UNIT/ML ~~LOC~~ SOLN
60.0000 [IU] | Freq: Once | SUBCUTANEOUS | Status: AC
  Administered 2015-11-15: 60 [IU] via SUBCUTANEOUS
  Filled 2015-11-15 (×2): qty 0.6

## 2015-11-15 MED ORDER — SODIUM CHLORIDE 0.9 % IV BOLUS (SEPSIS)
1000.0000 mL | Freq: Once | INTRAVENOUS | Status: AC
  Administered 2015-11-15: 1000 mL via INTRAVENOUS

## 2015-11-15 MED ORDER — MORPHINE SULFATE (PF) 4 MG/ML IV SOLN
4.0000 mg | Freq: Once | INTRAVENOUS | Status: AC
  Administered 2015-11-15: 4 mg via INTRAVENOUS
  Filled 2015-11-15: qty 1

## 2015-11-15 MED ORDER — SODIUM CHLORIDE 0.9 % IV BOLUS (SEPSIS)
250.0000 mL | Freq: Once | INTRAVENOUS | Status: AC
  Administered 2015-11-15: 250 mL via INTRAVENOUS

## 2015-11-15 MED ORDER — DEXTROSE 5 % IV SOLN
1.0000 g | Freq: Once | INTRAVENOUS | Status: AC
  Administered 2015-11-15: 1 g via INTRAVENOUS
  Filled 2015-11-15: qty 10

## 2015-11-15 MED ORDER — HYDROCODONE-ACETAMINOPHEN 5-325 MG PO TABS
2.0000 | ORAL_TABLET | Freq: Once | ORAL | Status: AC
  Administered 2015-11-15: 2 via ORAL
  Filled 2015-11-15: qty 2

## 2015-11-15 MED ORDER — INSULIN ASPART 100 UNIT/ML ~~LOC~~ SOLN
5.0000 [IU] | Freq: Once | SUBCUTANEOUS | Status: AC
  Administered 2015-11-15: 5 [IU] via SUBCUTANEOUS
  Filled 2015-11-15: qty 1

## 2015-11-15 MED ORDER — SODIUM CHLORIDE 0.9 % IV BOLUS (SEPSIS)
500.0000 mL | Freq: Once | INTRAVENOUS | Status: AC
  Administered 2015-11-15: 500 mL via INTRAVENOUS

## 2015-11-15 MED ORDER — KETOROLAC TROMETHAMINE 30 MG/ML IJ SOLN: 30.0000 mg | Freq: Once | INTRAMUSCULAR | Status: DC

## 2015-11-15 MED ORDER — OXYCODONE-ACETAMINOPHEN 5-325 MG PO TABS: 1.0000 | ORAL_TABLET | Freq: Four times a day (QID) | ORAL | Status: DC | PRN

## 2015-11-15 MED ORDER — INSULIN ASPART 100 UNIT/ML ~~LOC~~ SOLN
5.0000 [IU] | Freq: Once | SUBCUTANEOUS | Status: AC
  Administered 2015-11-15: 5 [IU] via INTRAVENOUS
  Filled 2015-11-15: qty 1

## 2015-11-15 MED ORDER — OXYCODONE-ACETAMINOPHEN 5-325 MG PO TABS
1.0000 | ORAL_TABLET | Freq: Once | ORAL | Status: AC
  Administered 2015-11-15: 1 via ORAL
  Filled 2015-11-15: qty 1

## 2015-11-15 MED ORDER — CEPHALEXIN 500 MG PO CAPS: 500.0000 mg | ORAL_CAPSULE | Freq: Two times a day (BID) | ORAL | Status: DC

## 2015-11-15 MED ORDER — HYDROCODONE-ACETAMINOPHEN 5-325 MG PO TABS: 1.0000 | ORAL_TABLET | ORAL | Status: DC | PRN

## 2015-11-15 MED ORDER — ONDANSETRON HCL 4 MG/2ML IJ SOLN
4.0000 mg | Freq: Once | INTRAMUSCULAR | Status: AC
  Administered 2015-11-15: 4 mg via INTRAVENOUS
  Filled 2015-11-15: qty 2

## 2015-11-15 MED ORDER — HYDROMORPHONE HCL 1 MG/ML IJ SOLN
1.0000 mg | Freq: Once | INTRAMUSCULAR | Status: AC
  Administered 2015-11-15: 1 mg via INTRAVENOUS
  Filled 2015-11-15: qty 1

## 2015-11-15 NOTE — Discharge Instructions (Signed)
Back Pain, Adult °Back pain is very common in adults. The cause of back pain is rarely dangerous and the pain often gets better over time. The cause of your back pain may not be known. Some common causes of back pain include: °· Strain of the muscles or ligaments supporting the spine. °· Wear and tear (degeneration) of the spinal disks. °· Arthritis. °· Direct injury to the back. °For many people, back pain may return. Since back pain is rarely dangerous, most people can learn to manage this condition on their own. °HOME CARE INSTRUCTIONS °Watch your back pain for any changes. The following actions may help to lessen any discomfort you are feeling: °· Remain active. It is stressful on your back to sit or stand in one place for long periods of time. Do not sit, drive, or stand in one place for more than 30 minutes at a time. Take short walks on even surfaces as soon as you are able. Try to increase the length of time you walk each day. °· Exercise regularly as directed by your health care provider. Exercise helps your back heal faster. It also helps avoid future injury by keeping your muscles strong and flexible. °· Do not stay in bed. Resting more than 1-2 days can delay your recovery. °· Pay attention to your body when you bend and lift. The most comfortable positions are those that put less stress on your recovering back. Always use proper lifting techniques, including: °· Bending your knees. °· Keeping the load close to your body. °· Avoiding twisting. °· Find a comfortable position to sleep. Use a firm mattress and lie on your side with your knees slightly bent. If you lie on your back, put a pillow under your knees. °· Avoid feeling anxious or stressed. Stress increases muscle tension and can worsen back pain. It is important to recognize when you are anxious or stressed and learn ways to manage it, such as with exercise. °· Take medicines only as directed by your health care provider. Over-the-counter  medicines to reduce pain and inflammation are often the most helpful. Your health care provider may prescribe muscle relaxant drugs. These medicines help dull your pain so you can more quickly return to your normal activities and healthy exercise. °· Apply ice to the injured area: °· Put ice in a plastic bag. °· Place a towel between your skin and the bag. °· Leave the ice on for 20 minutes, 2-3 times a day for the first 2-3 days. After that, ice and heat may be alternated to reduce pain and spasms. °· Maintain a healthy weight. Excess weight puts extra stress on your back and makes it difficult to maintain good posture. °SEEK MEDICAL CARE IF: °· You have pain that is not relieved with rest or medicine. °· You have increasing pain going down into the legs or buttocks. °· You have pain that does not improve in one week. °· You have night pain. °· You lose weight. °· You have a fever or chills. °SEEK IMMEDIATE MEDICAL CARE IF:  °· You develop new bowel or bladder control problems. °· You have unusual weakness or numbness in your arms or legs. °· You develop nausea or vomiting. °· You develop abdominal pain. °· You feel faint. °  °This information is not intended to replace advice given to you by your health care provider. Make sure you discuss any questions you have with your health care provider. °  °Document Released: 07/29/2005 Document Revised: 08/19/2014 Document Reviewed: 11/30/2013 °[employer] Interactive Patient Education ©2016 [employer] ° °Nonspecific Chest Pain  °Chest pain can be caused by many different conditions. There is always a chance that your pain could be related to something serious, such as a heart attack or a blood clot in your lungs. Chest pain can also be caused by conditions that are not life-threatening. If you have chest pain, it is very important to follow up with your health care provider. °CAUSES  °Chest pain can be caused by: °· Heartburn. °· Pneumonia or bronchitis. °· Anxiety or  stress. °· Inflammation around your heart (pericarditis) or lung (pleuritis or pleurisy). °· A blood clot in your lung. °· A collapsed lung (pneumothorax). It can develop suddenly on its own (spontaneous pneumothorax) or from trauma to the chest. °· Shingles infection (varicella-zoster virus). °· Heart attack. °· Damage to the bones, muscles, and cartilage that make up your chest wall. This can include: °¨ Bruised bones due to injury. °¨ Strained muscles or cartilage due to frequent or repeated coughing or overwork. °¨ Fracture to one or more ribs. °¨ Sore cartilage due to inflammation (costochondritis). °RISK FACTORS  °Risk factors for chest pain may include: °· Activities that increase your risk for trauma or injury to your chest. °· Respiratory infections or conditions that cause frequent coughing. °· Medical conditions or overeating that can cause heartburn. °· Heart disease or family history of heart disease. °· Conditions or health behaviors that increase your risk of developing a blood clot. °· Having had chicken pox (varicella zoster). °SIGNS AND SYMPTOMS °Chest pain can feel like: °· Burning or tingling on the surface of your chest or deep in your chest. °· Crushing, pressure, aching, or squeezing pain. °· Dull or sharp pain that is worse when you move, cough, or take a deep breath. °· Pain that is also felt in your back, neck, shoulder, or arm, or pain that spreads to any of these areas. °Your chest pain may come and go, or it may stay constant. °DIAGNOSIS °Lab tests or other studies may be needed to find the cause of your pain. Your health care provider may have you take a test called an ambulatory ECG (electrocardiogram). An ECG records your heartbeat patterns at the time the test is performed. You may also have other tests, such as: °· Transthoracic echocardiogram (TTE). During echocardiography, sound waves are used to create a picture of all of the heart structures and to look at how blood flows  through your heart. °· Transesophageal echocardiogram (TEE). This is a more advanced imaging test that obtains images from inside your body. It allows your health care provider to see your heart in finer detail. °· Cardiac monitoring. This allows your health care provider to monitor your heart rate and rhythm in real time. °· Holter monitor. This is a portable device that records your heartbeat and can help to diagnose abnormal heartbeats. It allows your health care provider to track your heart activity for several days, if needed. °· Stress tests. These can be done through exercise or by taking medicine that makes your heart beat more quickly. °· Blood tests. °· Imaging tests. °TREATMENT  °Your treatment depends on what is causing your chest pain. Treatment may include: °· Medicines. These may include: °¨ Acid blockers for heartburn. °¨ Anti-inflammatory medicine. °¨ Pain medicine for inflammatory conditions. °¨ Antibiotic medicine, if an infection is present. °¨ Medicines to dissolve blood clots. °¨ Medicines to treat coronary artery disease. °· Supportive care for conditions that do not require medicines. This may include: °¨ Resting. °¨   Applying heat or cold packs to injured areas. °¨ Limiting activities until pain decreases. °HOME CARE INSTRUCTIONS °· If you were prescribed an antibiotic medicine, finish it all even if you start to feel better. °· Avoid any activities that bring on chest pain. °· Do not use any tobacco products, including cigarettes, chewing tobacco, or electronic cigarettes. If you need help quitting, ask your health care provider. °· Do not drink alcohol. °· Take medicines only as directed by your health care provider. °· Keep all follow-up visits as directed by your health care provider. This is important. This includes any further testing if your chest pain does not go away. °· If heartburn is the cause for your chest pain, you may be told to keep your head raised (elevated) while sleeping.  This reduces the chance that acid will go from your stomach into your esophagus. °· Make lifestyle changes as directed by your health care provider. These may include: °¨ Getting regular exercise. Ask your health care provider to suggest some activities that are safe for you. °¨ Eating a heart-healthy diet. A registered dietitian can help you to learn healthy eating options. °¨ Maintaining a healthy weight. °¨ Managing diabetes, if necessary. °¨ Reducing stress. °SEEK MEDICAL CARE IF: °· Your chest pain does not go away after treatment. °· You have a rash with blisters on your chest. °· You have a fever. °SEEK IMMEDIATE MEDICAL CARE IF:  °· Your chest pain is worse. °· You have an increasing cough, or you cough up blood. °· You have severe abdominal pain. °· You have severe weakness. °· You faint. °· You have chills. °· You have sudden, unexplained chest discomfort. °· You have sudden, unexplained discomfort in your arms, back, neck, or jaw. °· You have shortness of breath at any time. °· You suddenly start to sweat, or your skin gets clammy. °· You feel nauseous or you vomit. °· You suddenly feel light-headed or dizzy. °· Your heart begins to beat quickly, or it feels like it is skipping beats. °These symptoms may represent a serious problem that is an emergency. Do not wait to see if the symptoms will go away. Get medical help right away. Call your local emergency services (911 in the [place] Do not drive yourself to the hospital. °  °This information is not intended to replace advice given to you by your health care provider. Make sure you discuss any questions you have with your health care provider. °  °Document Released: 05/08/2005 Document Revised: 08/19/2014 Document Reviewed: 03/04/2014 °[employer] Interactive Patient Education ©2016 [employer] ° °

## 2015-11-15 NOTE — ED Notes (Signed)
Pt O2 sats dropped into mid-80s. Placed on 2L O2 via [place], and notified MD.

## 2015-11-15 NOTE — ED Notes (Signed)
Pt departed in NAD.  

## 2015-11-15 NOTE — ED Notes (Signed)
Took sample from urostomy bag and emptied bag. EDP made aware.

## 2015-11-15 NOTE — ED Notes (Signed)
Pt ambulated w/o difficulty. MD notified and agreed to continue with d/c.

## 2015-11-15 NOTE — ED Provider Notes (Signed)
CSN: [id number]     Arrival date & time 11/15/15  1055 History   First MD Initiated Contact with Patient 11/15/15 1138     Chief Complaint  Patient presents with  . Neck Pain     (Consider location/radiation/quality/duration/timing/severity/associated sxs/prior Treatment) HPI Comments: 75yo F w/ extensive PMH including CAD s/p MI, IDDM, PVD s/p R BKA, bladder cancer w/ urostomy who p/w neck pain. The patient presented here yesterday evening for thoracic back, neck, and chest pain. Workup included a chest CT and lab work. She was discharged home after pain medications and reports that her neck pain was still present but was slightly improved with the morphine. This morning she woke up with severe worsening of her neck pain and she took one Percocet twice this morning without relief of her symptoms. She reports a long-standing history of chronic neck pain but states that this pain is more severe than it usually is. She states that she feels that the other pains that she has been having are related to the neck pain. The pain is worse with any side movements and is in the middle as well as both sides, right greater than left. She denies any hand numbness or extremity weakness. She denies any fevers or recent illness. No changes with her urine output. No saddle anesthesia. She denies any recent trauma. She was also noted to be hyperglycemic yesterday and again this morning. She admits to being noncompliant with her medications recently because of the pain. She states that when she has enough insulin, she gives herself Lantus twice daily.  Patient is a 75 y.o. female presenting with neck pain. The history is provided by the patient.  Neck Pain   Past Medical History  Diagnosis Date  . Hypertension   . Hyperlipidemia   . History of atrial tachycardia CONTROLLED W/ LOPRESSOR  . Peripheral vascular disease (HCC) S/P RIGHT BKA  . Headache(784.0)   . Peripheral neuropathy (HCC) LEGS AND HANDS  .  Arthritis   . 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 GERD (gastroesophageal reflux disease)   . Anemia   . Blood transfusion   . Diabetes mellitus ORAL AND INSULIN MEDS  . Macular degeneration of both eyes   . Glaucoma   . Retinopathy due to secondary diabetes mellitus ([place])   . Insomnia   . Constipation   . History of bladder cancer TCC AND CIS  . MI (mitral incompetence)   . Coronary artery disease   . Chronic kidney disease   . Cancer ([place])     bladder  . Myocardial infarction ([place])   . Presence of urostomy [facility])    Past Surgical History  Procedure Laterality Date  . I & d right below knee amputation wound  06-10-2011  . Cysto / resection bladder bx's  04-01-11  &  11-26-10  . Below knee leg amputation  05-02-2007    RIGHT  . Right foot i & d / removal necrotic bone  JUN  &  AUG 2008  . Right great toe amputation  11-05-2006  . Laparoscopic cholecystectomy  12-10-1999  . Back surgery  [place]  . Appendectomy  1963  . Cataract extraction w/ intraocular lens  implant, bilateral    . Cystoscopy with biopsy  10/14/2011    Procedure: CYSTOSCOPY WITH BIOPSY;  Surgeon: Claybon Jabs, MD;  Location: [facility];  Service: Urology;  Laterality: N/A;  BLADDER BIOPSY  . Coronary stent  placement    . Abdominal hysterectomy    . Revision urostomy cutaneous    . Revision urostomy cutaneous    . Abdominal angiogram N/A 09/08/2014    Procedure: ABDOMINAL ANGIOGRAM;  Surgeon: Conrad St. Clair, MD;  Location: [facility] CATH LAB;  Service: Cardiovascular;  Laterality: N/A;  . Femoral-popliteal bypass graft Left 09/12/2014    Procedure:  LEFT FEMORAL-POPLITEAL ARTERY BYPASS GRAFT USING NON REVERSE LEFT GREATER SAPHENOUS VEIN;  Surgeon: Rosetta Posner, MD;  Location: [place];  Service: Vascular;  Laterality: Left;  . Amputation Left 09/14/2014    Procedure: LEFT FOOT FIRST RAY AMPUTATION;  Surgeon: Mcarthur Rossetti, MD;  Location: [place];  Service: Orthopedics;   Laterality: Left;   Family History  Problem Relation Age of Onset  . Heart failure Mother     died age 9  . Sudden death Father 39  . Heart disease Father   . Hypertension Father   . Heart attack Father   . Diabetes Brother   . Heart attack Brother   . AAA (abdominal aortic aneurysm) Brother    Social History  Substance Use Topics  . Smoking status: Former Smoker -- 2.00 packs/day for 17 years    Types: Cigarettes    Quit date: 10/09/1975  . Smokeless tobacco: Never Used  . Alcohol Use: 0.0 oz/week    0 Standard drinks or equivalent per week     Comment: RARE   OB History    No data available     Review of Systems  Musculoskeletal: Positive for neck pain.   10 Systems reviewed and are negative for acute change except as noted in the HPI.    Allergies  Clarithromycin; Lisinopril; and Metformin and related  Home Medications   Prior to Admission medications   Medication Sig Start Date End Date Taking? Authorizing Provider  acetaminophen (TYLENOL) 500 MG tablet Take 1,000 mg by mouth every 6 (six) hours as needed for mild pain.    Yes Historical Provider, MD  aspirin 81 MG chewable tablet Chew 81 mg by mouth at bedtime.    Yes Historical Provider, MD  atorvastatin (LIPITOR) 80 MG tablet TAKE 1 TABLET (80 MG TOTAL) BY MOUTH EVERY EVENING. 06/08/15  Yes Jerline Pain, MD  furosemide (LASIX) 40 MG tablet Take 80 mg by mouth 3 (three) times daily.  08/16/13  Yes Historical Provider, MD  HYDROcodone-acetaminophen (NORCO/VICODIN) 5-325 MG tablet Take 1-2 tablets by mouth every 4 (four) hours as needed for moderate pain or severe pain. 11/15/15  Yes Charlesetta Shanks, MD  insulin glargine (LANTUS) 100 UNIT/ML injection Inject 55-60 Units into the skin 2 (two) times daily. 55 units in the morning and 60 units at night   Yes Historical Provider, MD  insulin lispro (HUMALOG) 100 UNIT/ML injection Inject 20-50 Units into the skin daily as needed for high blood sugar. PER SLIDING SCALE    Yes Historical Provider, MD  isosorbide mononitrate (IMDUR) 30 MG 24 hr tablet TAKE 1 TABLET (30 MG TOTAL) BY MOUTH DAILY. 08/24/15  Yes Jerline Pain, MD  Melatonin 10 MG TABS Take 10 mg by mouth at bedtime.    Yes Historical Provider, MD  metoprolol succinate (TOPROL-XL) 100 MG 24 hr tablet TAKE 1 TABLET (100 MG TOTAL) BY MOUTH EVERY EVENING. TAKE WITH OR IMMEDIATELY FOLLOWING A MEAL. 06/08/15  Yes Jerline Pain, MD  Multiple Vitamins-Minerals (PRESERVISION AREDS PO) Take 1 capsule by mouth 2 (two) times daily.   Yes Historical Provider, MD  nitroGLYCERIN (NITROSTAT) 0.4 MG SL tablet Place 0.4 mg under the tongue every 5 (five) minutes as needed for chest pain.   Yes Historical Provider, MD  omeprazole (PRILOSEC) 20 MG capsule Take 20 mg by mouth daily as needed (gurd).    Yes Historical Provider, MD  polyethylene glycol (MIRALAX / GLYCOLAX) packet Take 17 g by mouth daily. 06/08/13  Yes Pamella Pert, MD  potassium chloride SA (K-DUR,KLOR-CON) 20 MEQ tablet Take 20 mEq by mouth daily.  08/16/13  Yes Historical Provider, MD  Psyllium (VEGETABLE LAXATIVE PO) Take 4 tablets by mouth at bedtime.   Yes Historical Provider, MD  SODIUM BICARBONATE PO Take 20 g by mouth 2 (two) times daily.   Yes Historical Provider, MD   BP 106/49 mmHg  Pulse 107  Temp(Src) 98.1 F (36.7 C) (Oral)  Resp 27  SpO2 95% Physical Exam  Constitutional: She is oriented to person, place, and time. She appears well-developed and well-nourished.  Uncomfortable, holding neck still  HENT:  Head: Normocephalic and atraumatic.  dry mucous membranes  Eyes: Conjunctivae are normal. Pupils are equal, round, and reactive to light.  Neck:  Limited ROM 2/2 pain; midline and b/l paraspinal muscle TTP near C5-C7, no stepoff  Cardiovascular: Normal rate, regular rhythm and normal heart sounds.   No murmur heard. Pulmonary/Chest: Effort normal and breath sounds normal.  Abdominal: Soft. Bowel sounds are normal. She exhibits no  distension. There is no tenderness.  Musculoskeletal:  R BKA w/ prosthesis on  Neurological: She is alert and oriented to person, place, and time.  Fluent speech; 5/5 strength and normal sensation BUE  Skin: Skin is warm and dry.  Psychiatric: She has a normal mood and affect. Judgment normal.  Nursing note and vitals reviewed.   ED Course  Procedures (including critical care time) Labs Review Labs Reviewed  BASIC METABOLIC PANEL - Abnormal; Notable for the following:    Sodium 132 (*)    Potassium 5.5 (*)    Chloride 100 (*)    CO2 21 (*)    Glucose, Bld 480 (*)    BUN 43 (*)    Creatinine, Ser 1.52 (*)    GFR calc non Af Amer 33 (*)    GFR calc Af Amer 38 (*)    All other components within normal limits  CBC WITH DIFFERENTIAL/PLATELET - Abnormal; Notable for the following:    WBC 12.4 (*)    Neutro Abs 10.7 (*)    All other components within normal limits  URINALYSIS, ROUTINE W REFLEX MICROSCOPIC (NOT AT [facility]) - Abnormal; Notable for the following:    APPearance CLOUDY (*)    Glucose, UA 100 (*)    Hgb urine dipstick LARGE (*)    Ketones, ur 15 (*)    Protein, ur >300 (*)    Leukocytes, UA LARGE (*)    All other components within normal limits  URINE MICROSCOPIC-ADD ON - Abnormal; Notable for the following:    Squamous Epithelial / LPF 6-30 (*)    Bacteria, UA MANY (*)    Casts WBC CAST (*)    All other components within normal limits  I-STAT VENOUS BLOOD GAS, ED - Abnormal; Notable for the following:    pCO2, Ven 52.3 (*)    pO2, Ven 27.0 (*)    Bicarbonate 25.2 (*)    All other components within normal limits  I-STAT CHEM 8, ED - Abnormal; Notable for the following:    BUN 42 (*)    Creatinine, Ser  1.30 (*)    Glucose, Bld 415 (*)    Hemoglobin 11.2 (*)    HCT 33.0 (*)    All other components within normal limits  CBG MONITORING, ED - Abnormal; Notable for the following:    Glucose-Capillary 358 (*)    All other components within normal limits  URINE  CULTURE  BLOOD GAS, VENOUS  I-STAT CG4 LACTIC ACID, ED    Imaging Review Dg Chest 2 View  11/14/2015  CLINICAL DATA:  Chest pain and shortness of breath for 2 days. Initial encounter. EXAM: CHEST  2 VIEW COMPARISON:  PA and lateral chest 10/05/2015 and 11/14/2012. FINDINGS: The lungs are clear. Heart size is normal. No pneumothorax or pleural effusion. Remote left rib fractures are noted. IMPRESSION: No acute disease. Electronically Signed   By: Inge Rise M.D.   On: 11/14/2015 21:20   Ct Chest Wo Contrast  11/15/2015  CLINICAL DATA:  Chest pain onset today. EXAM: CT CHEST WITHOUT CONTRAST TECHNIQUE: Multidetector CT imaging of the chest was performed following the standard protocol without IV contrast. COMPARISON:  Radiograph 3 hours prior. Included lung bases from CT abdomen/pelvis 10/05/2015 and 06/08/2013. FINDINGS: Mediastinum/Lymph Nodes: Atherosclerotic calcifications of normal caliber thoracic aorta. Coronary artery calcifications versus coronary stent. Heart is normal in size. No pericardial effusion. No mediastinal or evidence of hilar adenopathy on noncontrast exam. There is a 3.5 cm hypodense nodule in the lower right thyroid gland extending to the isthmus. Lungs/Pleura: No consolidation. No pulmonary edema. Trachea and mainstem bronchi are patent. High-density tree and bud opacities in the right lower lobe, unchanged from prior exams dating back to 2014. Tiny subpleural nodules in the left lower lobe also unchanged dating back to 2014. Upper abdomen: No acute abnormality. Thinning of the included renal parenchyma. Pancreatic atrophy noted. Stomach distended with ingested contents. Musculoskeletal: Multilevel degenerative change throughout spine. Diffuse bony under mineralization. Remote left rib fractures. There are no acute or suspicious osseous abnormalities. IMPRESSION: 1. No acute intrathoracic process. 2. Aortic atherosclerosis, coronary artery calcifications versus stents. 3. Thyroid  nodule on the right/isthmus measures 3.5 cm. Recommend nonemergent thyroid ultrasound. 4. Chronic right lung base opacities, unchanged dating back to 2014 and considered benign. This may be secondary to aspirated barium. Electronically Signed   By: Jeb Levering M.D.   On: 11/15/2015 00:36   I have personally reviewed and evaluated these lab results as part of my medical decision-making.   EKG Interpretation   Date/Time:  Wednesday November 15 2015 13:30:32 EDT Ventricular Rate:  106 PR Interval:  150 QRS Duration: 89 QT Interval:  345 QTC Calculation: 458 R Axis:   51 Text Interpretation:  Sinus tachycardia Minimal ST elevation, inferior  leads tachycardia new from previous Confirmed by LITTLE MD, RACHEL [phone])  on 11/15/2015 1:34:39 PM     Medications  insulin glargine (LANTUS) injection 60 Units (60 Units Subcutaneous Given 11/15/15 1428)  sodium chloride 0.9 % bolus 1,000 mL (0 mLs Intravenous Stopped 11/15/15 1428)  ondansetron (ZOFRAN) injection 4 mg (4 mg Intravenous Given 11/15/15 1324)  morphine 4 MG/ML injection 4 mg (4 mg Intravenous Given 11/15/15 1324)  sodium chloride 0.9 % bolus 500 mL (0 mLs Intravenous Stopped 11/15/15 1428)  insulin aspart (novoLOG) injection 5 Units (5 Units Intravenous Given 11/15/15 1322)  insulin aspart (novoLOG) injection 5 Units (5 Units Subcutaneous Given 11/15/15 1513)  cefTRIAXone (ROCEPHIN) 1 g in dextrose 5 % 50 mL IVPB (0 g Intravenous Stopped 11/15/15 1542)  oxyCODONE-acetaminophen (PERCOCET/ROXICET) 5-325 MG per tablet  1 tablet (1 tablet Oral Given 11/15/15 1647)    MDM   Final diagnoses:  None  hyperglycemia Medication non-compliance Neck pain  PT p/w ongoing neck pain after work up last night including chest pain work up. On arrival, she was awake and alert, uncomfortable but in no acute distress. Vital signs notable for hypertension at 162/98, heart rate 117. She had dry mucous membranes, normal upper extremity strength and sensation. Obtained  above lab work to evaluate for DKA given her report of hyperglycemia. Gave the patient her home dose of Lantus as well as IV fluid bolus, Zofran, and morphine.  Because of the severity of the patient's neck pain and the fact that this is her second presentation for the same symptoms, obtained an MRI of the neck to evaluate for nerve impingement or other acute spinal process.  After receiving above medications and the fluids, the patient's blood sugar improved to 358 and repeat chem 8 showed normal potassium. Her urine is contaminated due to urostomy, however she has worsening pyuria and bacteriuria than usual. Therefore, gave CTX and sent urine culture. I am signing the patient out to the oncoming provider, who will follow up on MRI results. If the patient's MRI is reassuring, I anticipate that she will be stable for discharge.   Sharlett Iles, MD 11/15/15 [phone]

## 2015-11-15 NOTE — Discharge Instructions (Signed)
Keflex as prescribed.  Percocet as prescribed as needed for pain.  Follow-up with your primary Dr. if not improving in the next week.   Urinary Tract Infection Urinary tract infections (UTIs) can develop anywhere along your urinary tract. Your urinary tract is your body's drainage system for removing wastes and extra water. Your urinary tract includes two kidneys, two ureters, a bladder, and a urethra. Your kidneys are a pair of bean-shaped organs. Each kidney is about the size of your fist. They are located below your ribs, one on each side of your spine. CAUSES Infections are caused by microbes, which are microscopic organisms, including fungi, viruses, and bacteria. These organisms are so small that they can only be seen through a microscope. Bacteria are the microbes that most commonly cause UTIs. SYMPTOMS  Symptoms of UTIs may vary by age and gender of the patient and by the location of the infection. Symptoms in young women typically include a frequent and intense urge to urinate and a painful, burning feeling in the bladder or urethra during urination. Older women and men are more likely to be tired, shaky, and weak and have muscle aches and abdominal pain. A fever may mean the infection is in your kidneys. Other symptoms of a kidney infection include pain in your back or sides below the ribs, nausea, and vomiting. DIAGNOSIS To diagnose a UTI, your caregiver will ask you about your symptoms. Your caregiver will also ask you to provide a urine sample. The urine sample will be tested for bacteria and white blood cells. White blood cells are made by your body to help fight infection. TREATMENT  Typically, UTIs can be treated with medication. Because most UTIs are caused by a bacterial infection, they usually can be treated with the use of antibiotics. The choice of antibiotic and length of treatment depend on your symptoms and the type of bacteria causing your infection. HOME CARE  INSTRUCTIONS  If you were prescribed antibiotics, take them exactly as your caregiver instructs you. Finish the medication even if you feel better after you have only taken some of the medication.  Drink enough water and fluids to keep your urine clear or pale yellow.  Avoid caffeine, tea, and carbonated beverages. They tend to irritate your bladder.  Empty your bladder often. Avoid holding urine for long periods of time.  Empty your bladder before and after sexual intercourse.  After a bowel movement, women should cleanse from front to back. Use each tissue only once. SEEK MEDICAL CARE IF:   You have back pain.  You develop a fever.  Your symptoms do not begin to resolve within 3 days. SEEK IMMEDIATE MEDICAL CARE IF:   You have severe back pain or lower abdominal pain.  You develop chills.  You have nausea or vomiting.  You have continued burning or discomfort with urination. MAKE SURE YOU:   Understand these instructions.  Will watch your condition.  Will get help right away if you are not doing well or get worse.   This information is not intended to replace advice given to you by your health care provider. Make sure you discuss any questions you have with your health care provider.   Document Released: 05/08/2005 Document Revised: 04/19/2015 Document Reviewed: 09/06/2011 Elsevier Interactive Patient Education 2016 Else[employer].  Musculoskeletal Pain Musculoskeletal pain is muscle and boney aches and pains. These pains can occur in any part of the body. Your caregiver may treat you without knowing the cause of the pain. They  may treat you if blood or urine tests, X-rays, and other tests were normal.  CAUSES There is often not a definite cause or reason for these pains. These pains may be caused by a type of germ (virus). The discomfort may also come from overuse. Overuse includes working out too hard when your body is not fit. Boney aches also come from weather  changes. Bone is sensitive to atmospheric pressure changes. HOME CARE INSTRUCTIONS   Ask when your test results will be ready. Make sure you get your test results.  Only take over-the-counter or prescription medicines for pain, discomfort, or fever as directed by your caregiver. If you were given medications for your condition, do not drive, operate machinery or power tools, or sign legal documents for 24 hours. Do not drink alcohol. Do not take sleeping pills or other medications that may interfere with treatment.  Continue all activities unless the activities cause more pain. When the pain lessens, slowly resume normal activities. Gradually increase the intensity and duration of the activities or exercise.  During periods of severe pain, bed rest may be helpful. Lay or sit in any position that is comfortable.  Putting ice on the injured area.  Put ice in a bag.  Place a towel between your skin and the bag.  Leave the ice on for 15 to 20 minutes, 3 to 4 times a day.  Follow up with your caregiver for continued problems and no reason can be found for the pain. If the pain becomes worse or does not go away, it may be necessary to repeat tests or do additional testing. Your caregiver may need to look further for a possible cause. SEEK IMMEDIATE MEDICAL CARE IF:  You have pain that is getting worse and is not relieved by medications.  You develop chest pain that is associated with shortness or breath, sweating, feeling sick to your stomach (nauseous), or throw up (vomit).  Your pain becomes localized to the abdomen.  You develop any new symptoms that seem different or that concern you. MAKE SURE YOU:   Understand these instructions.  Will watch your condition.  Will get help right away if you are not doing well or get worse.   This information is not intended to replace advice given to you by your health care provider. Make sure you discuss any questions you have with your health  care provider.   Document Released: 07/29/2005 Document Revised: 10/21/2011 Document Reviewed: 04/02/2013 Elsevier Interactive Patient Education [employer].

## 2015-11-15 NOTE — ED Notes (Signed)
EDP notified about low BP.

## 2015-11-15 NOTE — ED Provider Notes (Signed)
Care simple from Dr. Rex Kras at shift change. Patient awaiting an MRI of her neck secondary to severe pain. This was performed and revealed spinal stenosis with multilevel degenerative disc disease, however nothing emergently surgical. She will be discharged with antibiotics for a UTI, pain medication for her neck, and when necessary follow-up if not improving with her primary doctor.  Veryl Speak, MD 11/15/15 2003

## 2015-11-15 NOTE — ED Notes (Signed)
Patient here with ongoing neck pain after being discharged from ED 0200 this am. Reports that [facility] ortho told her to come here. CBG by EMS [phone]

## 2015-11-16 LAB — URINE CULTURE

## 2015-11-17 ENCOUNTER — Ambulatory Visit: Payer: PPO | Admitting: Cardiology

## 2015-11-17 ENCOUNTER — Encounter: Payer: Self-pay | Admitting: Cardiology

## 2015-11-18 ENCOUNTER — Emergency Department (HOSPITAL_COMMUNITY): Payer: PPO

## 2015-11-18 ENCOUNTER — Encounter (HOSPITAL_COMMUNITY): Payer: Self-pay | Admitting: Emergency Medicine

## 2015-11-18 ENCOUNTER — Inpatient Hospital Stay (HOSPITAL_COMMUNITY)
Admission: EM | Admit: 2015-11-18 | Discharge: 2015-11-27 | DRG: 871 | Disposition: A | Discharge status: Skilled Nursing Facility | Payer: PPO | Attending: Internal Medicine | Admitting: Internal Medicine

## 2015-11-18 DIAGNOSIS — R570 Cardiogenic shock: Secondary | ICD-10-CM | POA: Insufficient documentation

## 2015-11-18 DIAGNOSIS — R0600 Dyspnea, unspecified: Secondary | ICD-10-CM

## 2015-11-18 DIAGNOSIS — E1165 Type 2 diabetes mellitus with hyperglycemia: Secondary | ICD-10-CM | POA: Diagnosis present

## 2015-11-18 DIAGNOSIS — J9602 Acute respiratory failure with hypercapnia: Secondary | ICD-10-CM | POA: Diagnosis not present

## 2015-11-18 DIAGNOSIS — E1151 Type 2 diabetes mellitus with diabetic peripheral angiopathy without gangrene: Secondary | ICD-10-CM | POA: Diagnosis not present

## 2015-11-18 DIAGNOSIS — H3530 Unspecified macular degeneration: Secondary | ICD-10-CM | POA: Diagnosis present

## 2015-11-18 DIAGNOSIS — E1122 Type 2 diabetes mellitus with diabetic chronic kidney disease: Secondary | ICD-10-CM | POA: Diagnosis present

## 2015-11-18 DIAGNOSIS — I5031 Acute diastolic (congestive) heart failure: Secondary | ICD-10-CM | POA: Diagnosis not present

## 2015-11-18 DIAGNOSIS — I959 Hypotension, unspecified: Secondary | ICD-10-CM | POA: Insufficient documentation

## 2015-11-18 DIAGNOSIS — Z8551 Personal history of malignant neoplasm of bladder: Secondary | ICD-10-CM

## 2015-11-18 DIAGNOSIS — J9601 Acute respiratory failure with hypoxia: Secondary | ICD-10-CM | POA: Diagnosis not present

## 2015-11-18 DIAGNOSIS — R6510 Systemic inflammatory response syndrome (SIRS) of non-infectious origin without acute organ dysfunction: Secondary | ICD-10-CM | POA: Diagnosis present

## 2015-11-18 DIAGNOSIS — E1121 Type 2 diabetes mellitus with diabetic nephropathy: Secondary | ICD-10-CM | POA: Diagnosis not present

## 2015-11-18 DIAGNOSIS — R7309 Other abnormal glucose: Secondary | ICD-10-CM | POA: Diagnosis not present

## 2015-11-18 DIAGNOSIS — Z9114 Patient's other noncompliance with medication regimen: Secondary | ICD-10-CM | POA: Diagnosis not present

## 2015-11-18 DIAGNOSIS — R001 Bradycardia, unspecified: Secondary | ICD-10-CM | POA: Diagnosis not present

## 2015-11-18 DIAGNOSIS — E874 Mixed disorder of acid-base balance: Secondary | ICD-10-CM | POA: Diagnosis not present

## 2015-11-18 DIAGNOSIS — D6489 Other specified anemias: Secondary | ICD-10-CM

## 2015-11-18 DIAGNOSIS — D649 Anemia, unspecified: Secondary | ICD-10-CM | POA: Diagnosis present

## 2015-11-18 DIAGNOSIS — I252 Old myocardial infarction: Secondary | ICD-10-CM

## 2015-11-18 DIAGNOSIS — Z9841 Cataract extraction status, right eye: Secondary | ICD-10-CM | POA: Diagnosis not present

## 2015-11-18 DIAGNOSIS — I495 Sick sinus syndrome: Secondary | ICD-10-CM | POA: Diagnosis present

## 2015-11-18 DIAGNOSIS — Z9842 Cataract extraction status, left eye: Secondary | ICD-10-CM

## 2015-11-18 DIAGNOSIS — I130 Hypertensive heart and chronic kidney disease with heart failure and stage 1 through stage 4 chronic kidney disease, or unspecified chronic kidney disease: Secondary | ICD-10-CM | POA: Diagnosis present

## 2015-11-18 DIAGNOSIS — Z89611 Acquired absence of right leg above knee: Secondary | ICD-10-CM | POA: Diagnosis not present

## 2015-11-18 DIAGNOSIS — A419 Sepsis, unspecified organism: Principal | ICD-10-CM | POA: Diagnosis present

## 2015-11-18 DIAGNOSIS — Z8052 Family history of malignant neoplasm of bladder: Secondary | ICD-10-CM

## 2015-11-18 DIAGNOSIS — I480 Paroxysmal atrial fibrillation: Secondary | ICD-10-CM

## 2015-11-18 DIAGNOSIS — E875 Hyperkalemia: Secondary | ICD-10-CM | POA: Diagnosis not present

## 2015-11-18 DIAGNOSIS — Z955 Presence of coronary angioplasty implant and graft: Secondary | ICD-10-CM

## 2015-11-18 DIAGNOSIS — Z936 Other artificial openings of urinary tract status: Secondary | ICD-10-CM | POA: Diagnosis not present

## 2015-11-18 DIAGNOSIS — I340 Nonrheumatic mitral (valve) insufficiency: Secondary | ICD-10-CM | POA: Diagnosis present

## 2015-11-18 DIAGNOSIS — E1140 Type 2 diabetes mellitus with diabetic neuropathy, unspecified: Secondary | ICD-10-CM | POA: Diagnosis present

## 2015-11-18 DIAGNOSIS — I509 Heart failure, unspecified: Secondary | ICD-10-CM

## 2015-11-18 DIAGNOSIS — R402411 Glasgow coma scale score 13-15, in the field [EMT or ambulance]: Secondary | ICD-10-CM | POA: Diagnosis not present

## 2015-11-18 DIAGNOSIS — I248 Other forms of acute ischemic heart disease: Secondary | ICD-10-CM | POA: Diagnosis present

## 2015-11-18 DIAGNOSIS — R441 Visual hallucinations: Secondary | ICD-10-CM | POA: Diagnosis present

## 2015-11-18 DIAGNOSIS — E1159 Type 2 diabetes mellitus with other circulatory complications: Secondary | ICD-10-CM | POA: Diagnosis not present

## 2015-11-18 DIAGNOSIS — Z7982 Long term (current) use of aspirin: Secondary | ICD-10-CM | POA: Diagnosis not present

## 2015-11-18 DIAGNOSIS — Z87891 Personal history of nicotine dependence: Secondary | ICD-10-CM

## 2015-11-18 DIAGNOSIS — I2510 Atherosclerotic heart disease of native coronary artery without angina pectoris: Secondary | ICD-10-CM | POA: Diagnosis present

## 2015-11-18 DIAGNOSIS — K219 Gastro-esophageal reflux disease without esophagitis: Secondary | ICD-10-CM | POA: Diagnosis not present

## 2015-11-18 DIAGNOSIS — Z794 Long term (current) use of insulin: Secondary | ICD-10-CM

## 2015-11-18 DIAGNOSIS — I10 Essential (primary) hypertension: Secondary | ICD-10-CM | POA: Diagnosis present

## 2015-11-18 DIAGNOSIS — E872 Acidosis: Secondary | ICD-10-CM | POA: Diagnosis not present

## 2015-11-18 DIAGNOSIS — E1101 Type 2 diabetes mellitus with hyperosmolarity with coma: Secondary | ICD-10-CM

## 2015-11-18 DIAGNOSIS — I4891 Unspecified atrial fibrillation: Secondary | ICD-10-CM | POA: Diagnosis present

## 2015-11-18 DIAGNOSIS — N179 Acute kidney failure, unspecified: Secondary | ICD-10-CM | POA: Diagnosis present

## 2015-11-18 DIAGNOSIS — N183 Chronic kidney disease, stage 3 unspecified: Secondary | ICD-10-CM | POA: Diagnosis present

## 2015-11-18 DIAGNOSIS — Z89511 Acquired absence of right leg below knee: Secondary | ICD-10-CM

## 2015-11-18 DIAGNOSIS — E11319 Type 2 diabetes mellitus with unspecified diabetic retinopathy without macular edema: Secondary | ICD-10-CM | POA: Diagnosis not present

## 2015-11-18 DIAGNOSIS — N390 Urinary tract infection, site not specified: Secondary | ICD-10-CM | POA: Diagnosis present

## 2015-11-18 DIAGNOSIS — E1100 Type 2 diabetes mellitus with hyperosmolarity without nonketotic hyperglycemic-hyperosmolar coma (NKHHC): Secondary | ICD-10-CM | POA: Diagnosis present

## 2015-11-18 DIAGNOSIS — R8290 Unspecified abnormal findings in urine: Secondary | ICD-10-CM | POA: Diagnosis not present

## 2015-11-18 DIAGNOSIS — E785 Hyperlipidemia, unspecified: Secondary | ICD-10-CM | POA: Diagnosis present

## 2015-11-18 DIAGNOSIS — Z9289 Personal history of other medical treatment: Secondary | ICD-10-CM

## 2015-11-18 DIAGNOSIS — R739 Hyperglycemia, unspecified: Secondary | ICD-10-CM | POA: Diagnosis not present

## 2015-11-18 DIAGNOSIS — E860 Dehydration: Secondary | ICD-10-CM | POA: Diagnosis present

## 2015-11-18 DIAGNOSIS — J810 Acute pulmonary edema: Secondary | ICD-10-CM | POA: Diagnosis not present

## 2015-11-18 DIAGNOSIS — H409 Unspecified glaucoma: Secondary | ICD-10-CM | POA: Diagnosis present

## 2015-11-18 DIAGNOSIS — G9341 Metabolic encephalopathy: Secondary | ICD-10-CM | POA: Diagnosis not present

## 2015-11-18 DIAGNOSIS — Z961 Presence of intraocular lens: Secondary | ICD-10-CM | POA: Diagnosis present

## 2015-11-18 DIAGNOSIS — C679 Malignant neoplasm of bladder, unspecified: Secondary | ICD-10-CM

## 2015-11-18 DIAGNOSIS — Z452 Encounter for adjustment and management of vascular access device: Secondary | ICD-10-CM

## 2015-11-18 DIAGNOSIS — G9340 Encephalopathy, unspecified: Secondary | ICD-10-CM | POA: Diagnosis not present

## 2015-11-18 HISTORY — DX: Type 2 diabetes mellitus with other circulatory complications: E11.59

## 2015-11-18 HISTORY — DX: Chronic kidney disease, stage 3 (moderate): N18.3

## 2015-11-18 HISTORY — DX: Atherosclerotic heart disease of native coronary artery without angina pectoris: I25.10

## 2015-11-18 HISTORY — DX: Obesity, unspecified: E66.9

## 2015-11-18 LAB — CBC WITH DIFFERENTIAL/PLATELET
Basophils Absolute: 0 10*3/uL (ref 0.0–0.1)
Basophils Relative: 0 %
EOS PCT: 1 %
Eosinophils Absolute: 0.1 10*3/uL (ref 0.0–0.7)
HEMATOCRIT: 34.2 % — AB (ref 36.0–46.0)
Hemoglobin: 10.6 g/dL — ABNORMAL LOW (ref 12.0–15.0)
LYMPHS ABS: 1.1 10*3/uL (ref 0.7–4.0)
LYMPHS PCT: 14 %
MCH: 25.9 pg — AB (ref 26.0–34.0)
MCHC: 31 g/dL (ref 30.0–36.0)
MCV: 83.4 fL (ref 78.0–100.0)
MONO ABS: 0.7 10*3/uL (ref 0.1–1.0)
Monocytes Relative: 9 %
NEUTROS ABS: 5.9 10*3/uL (ref 1.7–7.7)
Neutrophils Relative %: 76 %
PLATELETS: 185 10*3/uL (ref 150–400)
RBC: 4.1 MIL/uL (ref 3.87–5.11)
RDW: 13.9 % (ref 11.5–15.5)
WBC: 7.8 10*3/uL (ref 4.0–10.5)

## 2015-11-18 LAB — BASIC METABOLIC PANEL
ANION GAP: 10 (ref 5–15)
Anion gap: 12 (ref 5–15)
Anion gap: 10 (ref 5–15)
BUN: 71 mg/dL — AB (ref 6–20)
BUN: 64 mg/dL — ABNORMAL HIGH (ref 6–20)
BUN: 63 mg/dL — ABNORMAL HIGH (ref 6–20)
CHLORIDE: 97 mmol/L — AB (ref 101–111)
CHLORIDE: 107 mmol/L (ref 101–111)
CHLORIDE: 105 mmol/L (ref 101–111)
CO2: 16 mmol/L — ABNORMAL LOW (ref 22–32)
CO2: 16 mmol/L — AB (ref 22–32)
CO2: 16 mmol/L — ABNORMAL LOW (ref 22–32)
CREATININE: 2.63 mg/dL — AB (ref 0.44–1.00)
CREATININE: 2.37 mg/dL — AB (ref 0.44–1.00)
Calcium: 9 mg/dL (ref 8.9–10.3)
Calcium: 8.4 mg/dL — ABNORMAL LOW (ref 8.9–10.3)
Calcium: 8.8 mg/dL — ABNORMAL LOW (ref 8.9–10.3)
Creatinine, Ser: 2.96 mg/dL — ABNORMAL HIGH (ref 0.44–1.00)
GFR calc Af Amer: 17 mL/min — ABNORMAL LOW (ref >60)
GFR calc Af Amer: 19 mL/min — ABNORMAL LOW (ref >60)
GFR calc non Af Amer: 15 mL/min — ABNORMAL LOW (ref >60)
GFR calc non Af Amer: 17 mL/min — ABNORMAL LOW (ref >60)
GFR, EST AFRICAN AMERICAN: 22 mL/min — AB (ref >60)
GFR, EST NON AFRICAN AMERICAN: 19 mL/min — AB (ref >60)
GLUCOSE: 532 mg/dL — AB (ref 65–99)
GLUCOSE: 312 mg/dL — AB (ref 65–99)
Glucose, Bld: 286 mg/dL — ABNORMAL HIGH (ref 65–99)
POTASSIUM: 6.9 mmol/L — AB (ref 3.5–5.1)
Potassium: 5.7 mmol/L — ABNORMAL HIGH (ref 3.5–5.1)
Potassium: 5.2 mmol/L — ABNORMAL HIGH (ref 3.5–5.1)
Sodium: 125 mmol/L — ABNORMAL LOW (ref 135–145)
Sodium: 133 mmol/L — ABNORMAL LOW (ref 135–145)
Sodium: 131 mmol/L — ABNORMAL LOW (ref 135–145)

## 2015-11-18 LAB — URINE MICROSCOPIC-ADD ON

## 2015-11-18 LAB — URINALYSIS, ROUTINE W REFLEX MICROSCOPIC
GLUCOSE, UA: NEGATIVE mg/dL
Ketones, ur: NEGATIVE mg/dL
Nitrite: NEGATIVE
Protein, ur: 100 mg/dL — AB
SPECIFIC GRAVITY, URINE: 1.015 (ref 1.005–1.030)
pH: 6.5 (ref 5.0–8.0)

## 2015-11-18 LAB — RAPID URINE DRUG SCREEN, HOSP PERFORMED
Amphetamines: NOT DETECTED
BARBITURATES: NOT DETECTED
Benzodiazepines: NOT DETECTED
COCAINE: NOT DETECTED
OPIATES: POSITIVE — AB
Tetrahydrocannabinol: NOT DETECTED

## 2015-11-18 LAB — MRSA PCR SCREENING: MRSA by PCR: NEGATIVE

## 2015-11-18 LAB — HEPARIN LEVEL (UNFRACTIONATED): Heparin Unfractionated: 0.5 IU/mL (ref 0.30–0.70)

## 2015-11-18 LAB — TSH: TSH: 0.735 u[IU]/mL (ref 0.350–4.500)

## 2015-11-18 LAB — TROPONIN I
Troponin I: 0.08 ng/mL — ABNORMAL HIGH (ref <0.031)
Troponin I: 0.07 ng/mL — ABNORMAL HIGH (ref <0.031)

## 2015-11-18 LAB — PHOSPHORUS: Phosphorus: 3.7 mg/dL (ref 2.5–4.6)

## 2015-11-18 LAB — CBG MONITORING, ED
GLUCOSE-CAPILLARY: 375 mg/dL — AB (ref 65–99)
GLUCOSE-CAPILLARY: 329 mg/dL — AB (ref 65–99)
Glucose-Capillary: 407 mg/dL — ABNORMAL HIGH (ref 65–99)
Glucose-Capillary: 233 mg/dL — ABNORMAL HIGH (ref 65–99)

## 2015-11-18 LAB — LACTIC ACID, PLASMA
LACTIC ACID, VENOUS: 1.9 mmol/L (ref 0.5–2.0)
LACTIC ACID, VENOUS: 1.2 mmol/L (ref 0.5–2.0)

## 2015-11-18 LAB — GLUCOSE, CAPILLARY
GLUCOSE-CAPILLARY: 237 mg/dL — AB (ref 65–99)
GLUCOSE-CAPILLARY: 181 mg/dL — AB (ref 65–99)
Glucose-Capillary: 227 mg/dL — ABNORMAL HIGH (ref 65–99)

## 2015-11-18 LAB — I-STAT CG4 LACTIC ACID, ED: LACTIC ACID, VENOUS: 1.67 mmol/L (ref 0.5–2.0)

## 2015-11-18 LAB — MAGNESIUM: Magnesium: 1.4 mg/dL — ABNORMAL LOW (ref 1.7–2.4)

## 2015-11-18 LAB — AMMONIA: Ammonia: 18 umol/L (ref 9–35)

## 2015-11-18 LAB — CALCIUM: CALCIUM: 8.9 mg/dL (ref 8.9–10.3)

## 2015-11-18 MED ORDER — SODIUM CHLORIDE 0.9 % IV BOLUS (SEPSIS)
1000.0000 mL | Freq: Once | INTRAVENOUS | Status: AC
  Administered 2015-11-18: 1000 mL via INTRAVENOUS

## 2015-11-18 MED ORDER — TRAMADOL HCL 50 MG PO TABS
50.0000 mg | ORAL_TABLET | Freq: Four times a day (QID) | ORAL | Status: DC | PRN
  Administered 2015-11-19: 50 mg via ORAL
  Filled 2015-11-18: qty 1

## 2015-11-18 MED ORDER — DEXTROSE 5 % IV SOLN
1.0000 g | INTRAVENOUS | Status: DC
  Administered 2015-11-18 – 2015-11-20 (×3): 1 g via INTRAVENOUS
  Filled 2015-11-18 (×3): qty 10

## 2015-11-18 MED ORDER — HEPARIN BOLUS VIA INFUSION
4000.0000 [IU] | Freq: Once | INTRAVENOUS | Status: AC
  Administered 2015-11-18: 4000 [IU] via INTRAVENOUS
  Filled 2015-11-18: qty 4000

## 2015-11-18 MED ORDER — SODIUM CHLORIDE 0.9 % IV SOLN
INTRAVENOUS | Status: DC
  Administered 2015-11-18: 08:00:00 via INTRAVENOUS
  Administered 2015-11-19: 100 mL/h via INTRAVENOUS
  Administered 2015-11-21: 13:00:00 via INTRAVENOUS

## 2015-11-18 MED ORDER — OXYCODONE HCL 5 MG PO TABS
5.0000 mg | ORAL_TABLET | Freq: Once | ORAL | Status: AC
Start: 2015-11-18 — End: 2015-11-18
  Administered 2015-11-18: 5 mg via ORAL
  Filled 2015-11-18: qty 1

## 2015-11-18 MED ORDER — DILTIAZEM LOAD VIA INFUSION
20.0000 mg | Freq: Once | INTRAVENOUS | Status: AC
  Administered 2015-11-18: 20 mg via INTRAVENOUS
  Filled 2015-11-18: qty 20

## 2015-11-18 MED ORDER — ACETAMINOPHEN 650 MG RE SUPP
650.0000 mg | Freq: Four times a day (QID) | RECTAL | Status: DC | PRN
Start: 2015-11-18 — End: 2015-11-27
  Filled 2015-11-18: qty 1

## 2015-11-18 MED ORDER — INSULIN GLARGINE 100 UNIT/ML ~~LOC~~ SOLN
60.0000 [IU] | Freq: Two times a day (BID) | SUBCUTANEOUS | Status: DC
  Administered 2015-11-18 – 2015-11-19 (×4): 60 [IU] via SUBCUTANEOUS
  Filled 2015-11-18 (×6): qty 0.6

## 2015-11-18 MED ORDER — INSULIN ASPART 100 UNIT/ML ~~LOC~~ SOLN
0.0000 [IU] | SUBCUTANEOUS | Status: DC
  Administered 2015-11-18: 4 [IU] via SUBCUTANEOUS
  Administered 2015-11-18: 7 [IU] via SUBCUTANEOUS
  Administered 2015-11-18: 15 [IU] via SUBCUTANEOUS
  Administered 2015-11-18: 7 [IU] via SUBCUTANEOUS
  Administered 2015-11-18 – 2015-11-19 (×2): 4 [IU] via SUBCUTANEOUS
  Administered 2015-11-19: 7 [IU] via SUBCUTANEOUS
  Filled 2015-11-18 (×2): qty 1

## 2015-11-18 MED ORDER — ACETAMINOPHEN 325 MG PO TABS
650.0000 mg | ORAL_TABLET | Freq: Four times a day (QID) | ORAL | Status: DC | PRN
  Administered 2015-11-18 – 2015-11-27 (×4): 650 mg via ORAL
  Filled 2015-11-18 (×4): qty 2

## 2015-11-18 MED ORDER — SODIUM CHLORIDE 0.9 % IV SOLN
INTRAVENOUS | Status: DC
Start: 2015-11-18 — End: 2015-11-18

## 2015-11-18 MED ORDER — HEPARIN (PORCINE) IN NACL 100-0.45 UNIT/ML-% IJ SOLN
1300.0000 [IU]/h | INTRAMUSCULAR | Status: DC
Start: 2015-11-18 — End: 2015-11-24
  Administered 2015-11-18: 1200 [IU]/h via INTRAVENOUS
  Administered 2015-11-19 – 2015-11-23 (×7): 1300 [IU]/h via INTRAVENOUS
  Filled 2015-11-18 (×8): qty 250

## 2015-11-18 MED ORDER — DILTIAZEM HCL 100 MG IV SOLR
5.0000 mg/h | INTRAVENOUS | Status: DC
  Administered 2015-11-18: 8 mg/h via INTRAVENOUS
  Administered 2015-11-18: 7.5 mg/h via INTRAVENOUS
  Administered 2015-11-19 – 2015-11-20 (×3): 15 mg/h via INTRAVENOUS
  Administered 2015-11-20: 10 mg/h via INTRAVENOUS
  Filled 2015-11-18 (×7): qty 100

## 2015-11-18 MED ORDER — ONDANSETRON HCL 4 MG/2ML IJ SOLN
4.0000 mg | Freq: Four times a day (QID) | INTRAMUSCULAR | Status: DC | PRN
  Administered 2015-11-21: 4 mg via INTRAVENOUS
  Filled 2015-11-18: qty 2

## 2015-11-18 MED ORDER — DEXTROSE-NACL 5-0.45 % IV SOLN: INTRAVENOUS | Status: DC

## 2015-11-18 MED ORDER — MAGNESIUM SULFATE 2 GM/50ML IV SOLN
2.0000 g | Freq: Once | INTRAVENOUS | Status: AC
Start: 2015-11-18 — End: 2015-11-18
  Administered 2015-11-18: 2 g via INTRAVENOUS
  Filled 2015-11-18: qty 50

## 2015-11-18 MED ORDER — SODIUM CHLORIDE 0.9 % IV SOLN
1.0000 g | Freq: Once | INTRAVENOUS | Status: AC
  Administered 2015-11-18: 1 g via INTRAVENOUS
  Filled 2015-11-18: qty 10

## 2015-11-18 MED ORDER — INSULIN ASPART 100 UNIT/ML ~~LOC~~ SOLN
10.0000 [IU] | Freq: Once | SUBCUTANEOUS | Status: AC
  Administered 2015-11-18: 10 [IU] via INTRAVENOUS
  Filled 2015-11-18: qty 1

## 2015-11-18 MED ORDER — SODIUM CHLORIDE 0.9 % IV SOLN
INTRAVENOUS | Status: DC
  Administered 2015-11-18: 1000 mL via INTRAVENOUS

## 2015-11-18 MED ORDER — SODIUM CHLORIDE 0.9% FLUSH
3.0000 mL | Freq: Two times a day (BID) | INTRAVENOUS | Status: DC
  Administered 2015-11-21 – 2015-11-26 (×8): 3 mL via INTRAVENOUS

## 2015-11-18 MED ORDER — TRAMADOL HCL 50 MG PO TABS: 50.0000 mg | ORAL_TABLET | Freq: Four times a day (QID) | ORAL | Status: DC | PRN

## 2015-11-18 MED ORDER — ONDANSETRON HCL 4 MG PO TABS: 4.0000 mg | ORAL_TABLET | Freq: Four times a day (QID) | ORAL | Status: DC | PRN

## 2015-11-18 MED ORDER — SODIUM CHLORIDE 0.9 % IV SOLN
INTRAVENOUS | Status: DC
  Administered 2015-11-18: 07:00:00 via INTRAVENOUS

## 2015-11-18 MED ORDER — SODIUM CHLORIDE 0.9 % IV SOLN
INTRAVENOUS | Status: DC
  Administered 2015-11-18: 3.5 [IU]/h via INTRAVENOUS
  Filled 2015-11-18: qty 2.5

## 2015-11-18 MED ORDER — STERILE WATER FOR INJECTION IV SOLN
Freq: Once | INTRAVENOUS | Status: DC
  Filled 2015-11-18: qty 850

## 2015-11-18 NOTE — H&P (Deleted)
Na has increased to 133 and K has decreased to 5.7 with minimal UOP so will continue NS IVFs at 150/hr for now- AG remains normal and CBG has decreased to 312-Lactic acid has increased slightly to 1.9 and ammonia is normal at 18.  Erin Hearing, ANP

## 2015-11-18 NOTE — Progress Notes (Addendum)
Na has increased to 133 and K has decreased to 5.7 with minimal UOP so will continue NS IVFs at 150/hr for now- AG remains normal and CBG has decreased to 312-Lactic acid has increased slightly to 1.9 and ammonia is normal at 18.  Reviewed EDP note re: Cardiology consult:  "05:55- discussed with cardiology on-call. They agree with hospitalist admission, and will see the patient on an as-needed basis. The fellow, states that if needed, the hospitalist should call back for a consultation after 7 AM."  At this time will manage pt conservatively regarding her AF with rate control and initiate anticoagulation (no NOAC due to CKD); need to FU on ECHO and follow TNI. Pt has OP Cardiologist Dr. Maudie Flakes and can FU after dc  1138am: UDS was POSITIVE for opiates so pt was taking pain meds prior to admit  1503: SBP up to 140 so have decreased IVFs to 100/hr  1720: Magnesium low at 1.4 so we'll give 2 g IV 1 bolus; troponin slightly elevated at 0.08 and this is likely reflective of demand ischemia and recent hypotension and A. fib with RVR; third lactic acid is normal at 1.2 so no indication to cycle further; phosphorus is normal and an angled gap remains normal-sodium has decreased slightly to 131 but potassium continues to decrease to 5.2 with hydration BUN remains at 63 and creatinine at 2.37-could be reflective of upper GI bleeding so as a precaution we'll check fecal occult blood  Erin Hearing, ANP

## 2015-11-18 NOTE — ED Notes (Signed)
Md notified of pt's 6.9 K+; AD aware of pt's status

## 2015-11-18 NOTE — Progress Notes (Signed)
[facility] for Heparin Indication: atrial fibrillation  Allergies  Allergen Reactions  . Gabapentin Other (See Comments)    Extremely drunk and lethargic  . Lisinopril Other (See Comments)    Increased creatinine levels (pt currently taking low dose)  . Metformin And Related Other (See Comments)    Increased creatinine levels  . Biaxin [Clarithromycin] Itching    Patient Measurements: Height/Weight from 11/14/2015 Ht. 5'2" Wt. 89.8kg Heparin Dosing Weight: 70.8kg  Vital Signs: Temp: 97.9 F (36.6 C) (04/08 1628) Temp Source: Oral (04/08 1628) BP: 117/57 mmHg (04/08 1628) Pulse Rate: 116 (04/08 1628)  Labs:  Recent Labs  11/18/15 0409 11/18/15 0841 11/18/15 1614  HGB 10.6*  --   --   HCT 34.2*  --   --   PLT 185  --   --   HEPARINUNFRC  --   --  0.50  CREATININE 2.96* 2.63*  --    Estimated Creatinine Clearance: 19.6 mL/min (by C-G formula based on Cr of 2.63).  Medical History: Past Medical History  Diagnosis Date  . Hypertension   . Hyperlipidemia   . History of atrial tachycardia CONTROLLED W/ LOPRESSOR  . Peripheral vascular disease (HCC) S/P RIGHT BKA  . Headache(784.0)   . Peripheral neuropathy (HCC) LEGS AND HANDS  . Arthritis   . 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 GERD (gastroesophageal reflux disease)   . Anemia   . Blood transfusion   . Diabetes mellitus ORAL AND INSULIN MEDS  . Macular degeneration of both eyes   . Glaucoma   . Retinopathy due to secondary diabetes mellitus ([place])   . Insomnia   . Constipation   . History of bladder cancer TCC AND CIS  . MI (mitral incompetence)   . Coronary artery disease   . Chronic kidney disease   . Cancer ([place])     bladder  . Myocardial infarction ([place])   . Presence of urostomy [facility])    Assessment: This patient is known to pharmacy from past heparin therapy (2014).  She presents with atrial fibrillation and we have been asked to initiate  IV heparin for her.  She required higher than calculated dose using adjusted body weight in the past.  We will be cautious though as her renal function looks to be declining, currently with a Screat. of 2.96.  She has an estimated clearance ~ 45ml/min.    Initial heparin level = 0.50  Goal of Therapy:  Heparin level 0.3-0.7 units/ml Monitor platelets by anticoagulation protocol: Yes   Plan:  Heparin infusion to 1300 units / hr AM Heparin level, CBC  Thank you Anette Guarneri, PharmD [phone] 11/18/2015,5:00 PM

## 2015-11-18 NOTE — Plan of Care (Addendum)
75 yo F came by EMS from home. New onset a.fib. Dehydration, potasium up to 6.9 was treated w ca, insulin,  Bicarb drip at 100. Sodium 125 hypotensive 89/60 but not obtunded. Up from 60's on arrival HR 120's 3.5 L has been given. Lactic acid 1.67. Asked ER MD to discuss patient with PCCM given persistent hypotension despite aggressive IV fluid resuscitation, will give 1 more liter if continues to improve will accept to step down  Zaevion Parke 6:36 AM  Repeated BP 96/58 mentating close to baseline still tachycardic holding off on Cardizem due to hypotension. Continue to rehydrate.   Siddhi Dornbush 7:05 AM

## 2015-11-18 NOTE — ED Provider Notes (Signed)
CSN: [id number]     Arrival date & time 11/18/15  0243 History  By signing my name below, I, Placido Sou, attest that this documentation has been prepared under the direction and in the presence of Mancel Bale, MD. Electronically Signed: Placido Sou, ED Scribe. 11/18/2015. 3:46 AM.   Chief Complaint  Patient presents with  . Urinary Tract Infection   The history is provided by the patient. The history is limited by the condition of the patient. No language interpreter was used.    LEVEL 5 CAVEAT: ALTERED MENTAL STATUS  HPI Comments: [name] is a 75 y.o. female w/ extensive PMH including CAD s/p MI, IDDM, PVD s/p R BKA, bladder cancer w/ urostomy who presents to the Emergency Department by ambulance due to a possible UTI. Pt not speaking in full sentences and is not clearly answering provider's questions.   Past Medical History  Diagnosis Date  . Hypertension   . Hyperlipidemia   . History of atrial tachycardia CONTROLLED W/ LOPRESSOR  . Peripheral vascular disease (HCC) S/P RIGHT BKA  . Headache(784.0)   . Peripheral neuropathy (HCC) LEGS AND HANDS  . Arthritis   . 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 GERD (gastroesophageal reflux disease)   . Anemia   . Blood transfusion   . Diabetes mellitus ORAL AND INSULIN MEDS  . Macular degeneration of both eyes   . Glaucoma   . Retinopathy due to secondary diabetes mellitus (HCC)   . Insomnia   . Constipation   . History of bladder cancer TCC AND CIS  . MI (mitral incompetence)   . Coronary artery disease   . Chronic kidney disease   . Cancer (HCC)     bladder  . Myocardial infarction (HCC)   . Presence of urostomy [facility])    Past Surgical History  Procedure Laterality Date  . I & d right below knee amputation wound  06-10-2011  . Cysto / resection bladder bx's  04-01-11  &  11-26-10  . Below knee leg amputation  05-02-2007    RIGHT  . Right foot i & d / removal necrotic bone  JUN  &  AUG 2008   . Right great toe amputation  11-05-2006  . Laparoscopic cholecystectomy  12-10-1999  . Back surgery  1991   &  1971  . Appendectomy  1963  . Cataract extraction w/ intraocular lens  implant, bilateral    . Cystoscopy with biopsy  10/14/2011    Procedure: CYSTOSCOPY WITH BIOPSY;  Surgeon: Garnett Farm, MD;  Location: [facility];  Service: Urology;  Laterality: N/A;  BLADDER BIOPSY  . Coronary stent placement    . Abdominal hysterectomy    . Revision urostomy cutaneous    . Revision urostomy cutaneous    . Abdominal angiogram N/A 09/08/2014    Procedure: ABDOMINAL ANGIOGRAM;  Surgeon: Fransisco Hertz, MD;  Location: [facility] CATH LAB;  Service: Cardiovascular;  Laterality: N/A;  . Femoral-popliteal bypass graft Left 09/12/2014    Procedure:  LEFT FEMORAL-POPLITEAL ARTERY BYPASS GRAFT USING NON REVERSE LEFT GREATER SAPHENOUS VEIN;  Surgeon: Larina Earthly, MD;  Location: [facility] OR;  Service: Vascular;  Laterality: Left;  . Amputation Left 09/14/2014    Procedure: LEFT FOOT FIRST RAY AMPUTATION;  Surgeon: Kathryne Hitch, MD;  Location: [facility] OR;  Service: Orthopedics;  Laterality: Left;   Family History  Problem Relation Age of Onset  . Heart failure Mother     died  age 72  . Sudden death Father 22  . Heart disease Father   . Hypertension Father   . Heart attack Father   . Diabetes Brother   . Heart attack Brother   . AAA (abdominal aortic aneurysm) Brother    Social History  Substance Use Topics  . Smoking status: Former Smoker -- 2.00 packs/day for 17 years    Types: Cigarettes    Quit date: 10/09/1975  . Smokeless tobacco: Never Used  . Alcohol Use: 0.0 oz/week    0 Standard drinks or equivalent per week     Comment: RARE   OB History    No data available     Review of Systems  Unable to perform ROS: Mental status change   Allergies  Clarithromycin; Lisinopril; and Metformin and related  Home Medications   Prior to Admission medications   Medication Sig Start  Date End Date Taking? Authorizing Provider  acetaminophen (TYLENOL) 500 MG tablet Take 1,000 mg by mouth every 6 (six) hours as needed for mild pain.     Historical Provider, MD  aspirin 81 MG chewable tablet Chew 81 mg by mouth at bedtime.     Historical Provider, MD  atorvastatin (LIPITOR) 80 MG tablet TAKE 1 TABLET (80 MG TOTAL) BY MOUTH EVERY EVENING. 06/08/15   Jerline Pain, MD  cephALEXin (KEFLEX) 500 MG capsule Take 1 capsule (500 mg total) by mouth 2 (two) times daily. 11/15/15   Sharlett Iles, MD  furosemide (LASIX) 40 MG tablet Take 80 mg by mouth 3 (three) times daily.  08/16/13   Historical Provider, MD  HYDROcodone-acetaminophen (NORCO/VICODIN) 5-325 MG tablet Take 1-2 tablets by mouth every 4 (four) hours as needed for moderate pain or severe pain. 11/15/15   Charlesetta Shanks, MD  insulin glargine (LANTUS) 100 UNIT/ML injection Inject 55-60 Units into the skin 2 (two) times daily. 55 units in the morning and 60 units at night    Historical Provider, MD  insulin lispro (HUMALOG) 100 UNIT/ML injection Inject 20-50 Units into the skin daily as needed for high blood sugar. PER SLIDING SCALE    Historical Provider, MD  isosorbide mononitrate (IMDUR) 30 MG 24 hr tablet TAKE 1 TABLET (30 MG TOTAL) BY MOUTH DAILY. 08/24/15   Jerline Pain, MD  Melatonin 10 MG TABS Take 10 mg by mouth at bedtime.     Historical Provider, MD  metoprolol succinate (TOPROL-XL) 100 MG 24 hr tablet TAKE 1 TABLET (100 MG TOTAL) BY MOUTH EVERY EVENING. TAKE WITH OR IMMEDIATELY FOLLOWING A MEAL. 06/08/15   Jerline Pain, MD  Multiple Vitamins-Minerals (PRESERVISION AREDS PO) Take 1 capsule by mouth 2 (two) times daily.    Historical Provider, MD  nitroGLYCERIN (NITROSTAT) 0.4 MG SL tablet Place 0.4 mg under the tongue every 5 (five) minutes as needed for chest pain.    Historical Provider, MD  omeprazole (PRILOSEC) 20 MG capsule Take 20 mg by mouth daily as needed (gurd).     Historical Provider, MD   oxyCODONE-acetaminophen (PERCOCET) 5-325 MG tablet Take 1-2 tablets by mouth every 6 (six) hours as needed. 11/15/15   Veryl Speak, MD  polyethylene glycol (MIRALAX / GLYCOLAX) packet Take 17 g by mouth daily. 06/08/13   Pamella Pert, MD  potassium chloride SA (K-DUR,KLOR-CON) 20 MEQ tablet Take 20 mEq by mouth daily.  08/16/13   Historical Provider, MD  Psyllium (VEGETABLE LAXATIVE PO) Take 4 tablets by mouth at bedtime.    Historical Provider, MD  SODIUM BICARBONATE  PO Take 20 g by mouth 2 (two) times daily.    Historical Provider, MD   BP 78/66 mmHg  Pulse 137  Temp(Src) 98.9 F (37.2 C) (Rectal)  Resp 16  SpO2 99% Physical Exam  Constitutional: She appears well-developed and well-nourished.  HENT:  Head: Normocephalic and atraumatic.  Mouth/Throat: Mucous membranes are dry.  Eyes: Conjunctivae and EOM are normal. Pupils are equal, round, and reactive to light.  Pale conjunctiva  Neck: Normal range of motion and phonation normal. Neck supple.  Cardiovascular: Regular rhythm.  Tachycardia present.   Hypotensive on examination  Pulmonary/Chest: Effort normal. She has no wheezes. She has rales (present at bases bilaterally). She exhibits no tenderness.  No rhonchi  Abdominal: Soft. She exhibits no distension. There is no tenderness. There is no guarding.  Urostomy RLQ; site appears nml  Musculoskeletal: Normal range of motion.  Right AKA with prosthetic in place  Neurological: She exhibits normal muscle tone.  Dysarthria   Skin: Skin is warm and dry.  Psychiatric: She has a normal mood and affect. Her behavior is normal. Judgment and thought content normal.  Nursing note and vitals reviewed.  ED Course  Procedures  DIAGNOSTIC STUDIES: Oxygen Saturation is 98% on RA, normal by my interpretation.    COORDINATION OF CARE: 3:43 AM Discussed next steps with pt. She verbalized understanding and is agreeable with the plan.   Medications  0.9 %  sodium chloride infusion (not  administered)  insulin aspart (novoLOG) injection 10 Units (not administered)  calcium gluconate 1 g in sodium chloride 0.9 % 100 mL IVPB (not administered)  sodium chloride 0.9 % bolus 1,000 mL (not administered)  sodium chloride 0.9 % bolus 1,000 mL (0 mLs Intravenous Stopped 11/18/15 0429)  sodium chloride 0.9 % bolus 1,000 mL (1,000 mLs Intravenous New Bag/Given 11/18/15 0432)    Patient Vitals for the past 24 hrs:  BP Temp Temp src Pulse Resp SpO2  11/18/15 0430 (!) 78/66 mmHg - - (!) 137 16 99 %  11/18/15 0400 (!) 68/56 mmHg - - (!) 127 17 97 %  11/18/15 0353 - 98.9 F (37.2 C) Rectal - - -  11/18/15 0345 (!) 75/41 mmHg - - - 13 -  11/18/15 0330 - - - 111 20 98 %  11/18/15 0315 - - - (!) 54 23 92 %  11/18/15 0300 (!) 79/51 mmHg 97.8 F (36.6 C) Oral (!) 44 24 98 %    4:49 AM Reevaluation with update and discussion. After initial assessment and treatment, an updated evaluation reveals Patient remains alert, is somewhat sleepy now but arousable. She continues to be tachycardic, and hypotensive. Heart rate appears irregular. EKG ordered. Raja Liska L   0515- EKG consistent with new onset atrial fibrillation, rapid ventricular response. Acute on chronic renal insufficiency, with hyperkalemia and hyperglycemia is present. Urgent treatment required for stabilization.  This patients CHA2DS2-VASc Score and unadjusted Ischemic Stroke Rate (% per year) is equal to 4.8 % stroke rate/year from a score of 4  Above score calculated as 1 point each if present [CHF, HTN, DM, Vascular=MI/PAD/Aortic Plaque, Age if 65-74, or Female] Above score calculated as 2 points each if present [Age > 75, or Stroke/TIA/TE]  05:55- discussed with cardiology on-call. They agree with hospitalist admission, and will see the patient on an as-needed basis. The fellow, states that if needed, the hospitalist should call back for a consultation after 7 AM.  6:00 AM-Consult complete with Dr. Roel Cluck. Patient case  explained and discussed. She agrees  to admit patient for further evaluation and treatment. She would like me to touch base with the critical care service to see if they want to admit the patient. Call ended at 0630-  06:38- case discussed with critical care service, Dr. Jimmy Footman. She recommends, give 1 additional liter of saline, and repeat labs. She believes if the patient continues to trend better, that she can be admitted by the hospitalist service.  07:05- . I discussed the case with the admitting hospitalist. APP. She requests D/C bicarbonate drip, and she will assume care and manage the patient.  CRITICAL CARE Performed by: Richarda Blade Total critical care time: 80 minutes Critical care time was exclusive of separately billable procedures and treating other patients. Critical care was necessary to treat or prevent imminent or life-threatening deterioration. Critical care was time spent personally by me on the following activities: development of treatment plan with patient and/or surrogate as well as nursing, discussions with consultants, evaluation of patient's response to treatment, examination of patient, obtaining history from patient or surrogate, ordering and performing treatments and interventions, ordering and review of laboratory studies, ordering and review of radiographic studies, pulse oximetry and re-evaluation of patient's condition.  Labs Review Labs Reviewed  URINALYSIS, ROUTINE W REFLEX MICROSCOPIC (NOT AT [facility]) - Abnormal; Notable for the following:    APPearance HAZY (*)    Hgb urine dipstick LARGE (*)    Bilirubin Urine SMALL (*)    Protein, ur 100 (*)    Leukocytes, UA MODERATE (*)    All other components within normal limits  URINE MICROSCOPIC-ADD ON - Abnormal; Notable for the following:    Squamous Epithelial / LPF 0-5 (*)    Bacteria, UA FEW (*)    All other components within normal limits  BASIC METABOLIC PANEL - Abnormal; Notable for the following:     Sodium 125 (*)    Potassium 6.9 (*)    Chloride 97 (*)    CO2 16 (*)    Glucose, Bld 532 (*)    BUN 71 (*)    Creatinine, Ser 2.96 (*)    GFR calc non Af Amer 15 (*)    GFR calc Af Amer 17 (*)    All other components within normal limits  CBC WITH DIFFERENTIAL/PLATELET - Abnormal; Notable for the following:    Hemoglobin 10.6 (*)    HCT 34.2 (*)    MCH 25.9 (*)    All other components within normal limits  URINE CULTURE  I-STAT CG4 LACTIC ACID, ED  I-STAT CG4 LACTIC ACID, ED   Component     Latest Ref Rng 11/15/2015 11/15/2015 11/18/2015        12:31 PM  2:39 PM   Sodium     135 - 145 mmol/L 132 (L) 137 125 (L)  Potassium     3.5 - 5.1 mmol/L 5.5 (H) 5.0 6.9 (HH)  Chloride     101 - 111 mmol/L 100 (L) 102 97 (L)  CO2     22 - 32 mmol/L 21 (L)  16 (L)  Glucose     65 - 99 mg/dL 480 (H) 415 (H) 532 (H)  BUN     6 - 20 mg/dL 43 (H) 42 (H) 71 (H)  Creatinine     0.44 - 1.00 mg/dL 1.52 (H) 1.30 (H) 2.96 (H)  Calcium     8.9 - 10.3 mg/dL 9.6  9.0  EGFR (Non-African Amer.)     >60 mL/min 33 (L)  15 (L)  EGFR (African American)     >60 mL/min 38 (L)  17 (L)  Anion gap     5 - '15 11  12    '$ Imaging Review Dg Chest Port 1 View  11/18/2015  CLINICAL DATA:  Hypotension.  Tachycardia. EXAM: PORTABLE CHEST 1 VIEW COMPARISON:  11/14/2015 FINDINGS: There is unchanged borderline cardiomegaly. The lungs are clear. Is no large effusion. Pulmonary vasculature is normal. No interval change is evident. IMPRESSION: No active disease. Electronically Signed   By: Andreas Newport M.D.   On: 11/18/2015 04:35   I have personally reviewed and evaluated these images and lab results as part of my medical decision-making.   EKG Interpretation   Date/Time:  Saturday November 18 2015 05:06:43 EDT Ventricular Rate:  126 PR Interval:    QRS Duration: 103 QT Interval:  313 QTC Calculation: 453 R Axis:   20 Text Interpretation:  Atrial fibrillation Low voltage, extremity leads  Since last tracing  Atrial fibrillation with rapid ventricular response is  new Confirmed by Eulis Foster  MD, Khamil Lamica ([id number]) on 11/18/2015 5:15:18 AM      MDM   Final diagnoses:  New onset atrial fibrillation (HCC)  Hyperkalemia  AKI (acute kidney injury) [facility])   Patient presenting by EMS, is confused. She is unable to specify why she came here. On initial evaluation, she is hypotensive. Heart rate has been labile. She is currently being treated for UTI, based on urinalysis, culture done on 11/15/15 shows multiple species, hence, is nondiagnostic for UTI. She has a chronic urostomy with catheter drainage. Urine is yellow with particulate matter. Repeat culture ordered. EKG indicates new onset atrial fibrillation. Patient treated for worsening renal insufficiency, and hyperkalemia, in the emergency department. She will require admission for further treatment and stabilization.  Nursing Notes Reviewed/ Care Coordinated, and agree without changes. Applicable Imaging Reviewed.  Interpretation of Laboratory Data incorporated into ED treatment  Plan: Admit   I personally performed the services described in this documentation, which was scribed in my presence. The recorded information has been reviewed and is accurate.      Daleen Bo, MD 11/18/15 [phone]

## 2015-11-18 NOTE — ED Notes (Signed)
Admitting at bedside 

## 2015-11-18 NOTE — ED Notes (Signed)
Pt. Given 4L of normal saline overnight. Pt. Only output 154mL urine via her urostomy.

## 2015-11-18 NOTE — ED Notes (Signed)
Md aware that pt status is ? Sepsis and current vitals; Md states he will make decision weather to call code sepsis and will inform RN

## 2015-11-18 NOTE — Progress Notes (Signed)
Pt received from Ed per stretcher- admitted to 2C03. CHG bath & MRSA swab done. On Cardizem & Hep gtt.

## 2015-11-18 NOTE — ED Notes (Signed)
Md aware of pt low BP

## 2015-11-18 NOTE — Progress Notes (Signed)
ANTICOAGULATION CONSULT NOTE - Initial Consult  Pharmacy Consult for Heparin Indication: atrial fibrillation  Allergies  Allergen Reactions  . Clarithromycin Itching  . Lisinopril Other (See Comments)    Increased creatinine levels (pt currently taking low dose)  . Metformin And Related Other (See Comments)    Increased creatinine levels    Patient Measurements: Height/Weight from 11/14/2015 Ht. 5'2" Wt. 89.8kg Heparin Dosing Weight: 70.8kg  Vital Signs: Temp: 98.9 F (37.2 C) (04/08 0353) Temp Source: Rectal (04/08 0353) BP: 120/66 mmHg (04/08 0720) Pulse Rate: 100 (04/08 0700)  Labs:  Recent Labs  11/15/15 1231 11/15/15 1439 11/18/15 0409  HGB 12.0 11.2* 10.6*  HCT 37.8 33.0* 34.2*  PLT 189  --  185  CREATININE 1.52* 1.30* 2.96*   Estimated Creatinine Clearance: 17.4 mL/min (by C-G formula based on Cr of 2.96).  Medical History: Past Medical History  Diagnosis Date  . Hypertension   . Hyperlipidemia   . History of atrial tachycardia CONTROLLED W/ LOPRESSOR  . Peripheral vascular disease (HCC) S/P RIGHT BKA  . Headache(784.0)   . Peripheral neuropathy (HCC) LEGS AND HANDS  . Arthritis   . 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 GERD (gastroesophageal reflux disease)   . Anemia   . Blood transfusion   . Diabetes mellitus ORAL AND INSULIN MEDS  . Macular degeneration of both eyes   . Glaucoma   . Retinopathy due to secondary diabetes mellitus ([place])   . Insomnia   . Constipation   . History of bladder cancer TCC AND CIS  . MI (mitral incompetence)   . Coronary artery disease   . Chronic kidney disease   . Cancer ([place])     bladder  . Myocardial infarction ([place])   . Presence of urostomy [facility])    Assessment: This patient is known to pharmacy from past heparin therapy (2014).  She presents with atrial fibrillation and we have been asked to initiate IV heparin for her.  She required higher than calculated dose using adjusted body weight  in the past.  We will be cautious though as her renal function looks to be declining, currently with a Screat. of 2.96.  She has an estimated clearance ~ 34ml/min.    I spoke with patient and   Goal of Therapy:  Heparin level 0.3-0.7 units/ml Monitor platelets by anticoagulation protocol: Yes   Plan:  Heparin 4000 units IV bolus x 1 Heparin infusion at 1200 units/hr Check 8 hour heparin level and adjust dose as needed Monitor renal function  Rober Minion, PharmD., MS Clinical Pharmacist Pager:  [phone] Thank you for allowing pharmacy to be part of this patients care team. 11/18/2015,8:23 AM

## 2015-11-18 NOTE — Progress Notes (Addendum)
11/18/2015 Patient came from the emergency room to 2central at 1408. She is alert, oriented and sometime have some confusion. Patient have a right bka her sacrum is a little discolored and her left lower leg. Noted some swelling in left lower leg. Patient great toe on the left foot old amputaion. Noted feet is dry. Patient have scab on the right arm and bruise on the left hand. She also have some swelling in both arms. Patient have a urostomy of right quadrant, and it was draining amber urine noted some odor. Patient was place on telemetry, CHG and MRSA was completed. Bed alarm was turned on. [facility] RN.

## 2015-11-18 NOTE — ED Notes (Signed)
Pt. Moved up, sat up, and given a few sips of beverage with no issues.

## 2015-11-18 NOTE — ED Notes (Signed)
Dr. Eulis Foster notified of pts potassium.

## 2015-11-18 NOTE — ED Notes (Addendum)
MD informed of pt status. Pt pale, BP low, urine appears and feels like vaseline. Malodorous. Pt altered. Urine collected. blood collected. No new orders. Awaiting MD orders. RNs want to call code sepsis on pt but MD said he will decide aboput the code sepsis after rectal temp

## 2015-11-18 NOTE — ED Notes (Signed)
Left IV site unhooked. Will draw labs ~ 30 min.

## 2015-11-18 NOTE — ED Notes (Signed)
Per [facility]  Pt non compliant with ABT recently discharged on 4/5 with UTI. Pt not taking her insulin medications. Pt smells strong of malodorous urine. Pt alert to self. Disoriented to month. Alert to place. Pt answers DOB incorrectly on first try but able to answer DOB correctly on 2nd try.   CBG 537

## 2015-11-18 NOTE — ED Notes (Signed)
MD at bedside. 

## 2015-11-18 NOTE — ED Notes (Signed)
Pharmacy contacted about need for Lantus and Heparin to be sent.

## 2015-11-18 NOTE — H&P (Signed)
[facility] and Physical                                                                                    [name], is a 75 y.o. female  MRN: [id number]   DOB - [birth year]  Admit Date - 11/18/2015  Outpatient Primary MD for the patient is Gerrit Heck, MD  Referring MD: Eulis Foster / ER  Consulting M.D: [facility]  PMH: Past Medical History  Diagnosis Date  . Hypertension   . Hyperlipidemia   . History of atrial tachycardia CONTROLLED W/ LOPRESSOR  . Peripheral vascular disease (HCC) S/P RIGHT BKA  . Headache(784.0)   . Peripheral neuropathy (HCC) LEGS AND HANDS  . Arthritis   . 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 GERD (gastroesophageal reflux disease)   . Anemia   . Blood transfusion   . Diabetes mellitus ORAL AND INSULIN MEDS  . Macular degeneration of both eyes   . Glaucoma   . Retinopathy due to secondary diabetes mellitus ([place])   . Insomnia   . Constipation   . History of bladder cancer TCC AND CIS  . MI (mitral incompetence)   . Coronary artery disease   . Chronic kidney disease   . Cancer ([place])     bladder  . Myocardial infarction ([place])   . Presence of urostomy ([place])       PSH: Past Surgical History  Procedure Laterality Date  . I & d right below knee amputation wound  06-10-2011  . Cysto / resection bladder bx's  04-01-11  &  11-26-10  . Below knee leg amputation  05-02-2007    RIGHT  . Right foot i & d / removal necrotic bone  JUN  &  AUG 2008  . Right great toe amputation  11-05-2006  . Laparoscopic cholecystectomy  12-10-1999  . Back surgery  [place]  . Appendectomy  1963  . Cataract extraction w/ intraocular lens  implant, bilateral    . Cystoscopy with biopsy  10/14/2011    Procedure: CYSTOSCOPY WITH BIOPSY;  Surgeon: Claybon Jabs, MD;  Location: [facility];  Service: Urology;  Laterality: N/A;  BLADDER BIOPSY  . Coronary stent placement    . Abdominal hysterectomy      . Revision urostomy cutaneous    . Revision urostomy cutaneous    . Abdominal angiogram N/A 09/08/2014    Procedure: ABDOMINAL ANGIOGRAM;  Surgeon: Conrad [place], MD;  Location: [facility] CATH LAB;  Service: Cardiovascular;  Laterality: N/A;  . Femoral-popliteal bypass graft Left 09/12/2014    Procedure:  LEFT FEMORAL-POPLITEAL ARTERY BYPASS GRAFT USING NON REVERSE LEFT GREATER SAPHENOUS VEIN;  Surgeon: Rosetta Posner, MD;  Location: [place];  Service: Vascular;  Laterality: Left;  . Amputation Left 09/14/2014    Procedure: LEFT FOOT FIRST RAY AMPUTATION;  Surgeon: Mcarthur Rossetti, MD;  Location: [place];  Service: Orthopedics;  Laterality: Left;     CC:  Chief Complaint  Patient presents with  . 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 2016 as well as prior right BKA secondary to nonhealing ulcer, history of CAD with MI in 2014 after bladder surgery at [facility] in the setting as well as uncontrolled diabetes, (last stress test in 2014 showed no definitive evidence of prior infarction or ischemia), diabetes mellitus on insulin with issues of poorly controlled diabetes with hemoglobin A1c apparently around 13 at time of cardiac event in 2014, chronic kidney disease stage 3, hypertension, anemia of chronic disease, obesity and history of paroxysmal atrial tachycardia. Patient has presented to the ER a total of 3 times beginning on 4/4. Initially for complaints of chest pain that were determined to be musculoskeletal and radiating from the thoracic spine to the chest wall. At that time patient was given a prescription for Vicodin. She returned on 4/5 with complaints of neck pain and was found to be somewhat hyperglycemic with possible UTI. MRI showed no acute issues and patient was given a prescription for Percocet and for Keflex and discharged. She was  hyperglycemic and felt to be somewhat volume depleted she was given IV fluid bolus and her usual home dose of Lantus.   She returned to the ER today with reports of altered mentation. History is limited since patient is confused and has been obtained from the electronic medical records. Patient lives with her daughter and I suspect her daughter called EMS. Per [facility] patient had been noncompliant with recently prescribed medications for UTI and apparently had not been taking her insulin as well. Upon their arrival the patient smells very strongly of malodorous urine. She apparently was oriented to self only as well as to place. No mention of vital signs in the field. Please see below regarding initial vital signs in treatments initiated in the ER. Upon my evaluation of the patient she still remained very confused and was hallucinating and was unable to answer questions appropriately.  ER Evaluation and treatment: Afebrile-BP 79/50 1-44-respirations 24 with room air saturations 98% at presentation Blood pressure nadir 68/56 in conjunction with new onset atrial fibrillation with ventricular response 127-137 bpm Improvement in blood pressure after fluid challenges to 120/66 with pulse gym between 119 and 128 bpm EKG: Atrial fibrillation with ventricular (new) response 126 bpm, QTC 453 ms no ischemic changes noted and normal R-wave progression PCXR: No acute disease Lab data: Na 125, K 6.9, CO2 16 with normal AG, BUN 71, Cr 2.96, lactic acid 1.67, WBC 7800 with normal differential, hemoglobin 10.6, platelets 195,000, serum glucose 532, urinalysis abnormal and concerning for UTI with moderate leukocytes negative nitrite 100 protein with WBC 6-30, blood cultures and urine culture obtained and pending Normal saline boluses 4 L Regular insulin 10 units IV 1 Calcium gluconate 1 g IV 1 Insulin infusion with titration  Review of Systems   **Unable to obtain an accurate history of present  illness from the patient due to her altered mentation and confusion; strip primarily obtained from the medical record and nursing staff  Social History Social History  Substance Use Topics  . Smoking status: Former Smoker -- 2.00 packs/day for 17 years    Types: Cigarettes    Quit date: 10/09/1975  . Smokeless tobacco: Never Used  . Alcohol Use: 0.0 oz/week    0 Standard drinks or equivalent per week     Comment: RARE    Resides at: Private residence  Lives with: Daughter  Ambulatory status: Patient unable to provide this information  Family History Family History  Problem Relation Age of Onset  . Heart failure Mother     died age 67  . Sudden death Father 50  . Heart disease Father   . Hypertension Father   . Heart attack Father   . Diabetes Brother   . Heart attack Brother   . AAA (abdominal aortic aneurysm) Brother      Prior to Admission medications   Medication Sig Start Date End Date Taking? Authorizing Provider  acetaminophen (TYLENOL) 500 MG tablet Take 1,000 mg by mouth every 6 (six) hours as needed for mild pain.     Historical Provider, MD  aspirin 81 MG chewable tablet Chew 81 mg by mouth at bedtime.     Historical Provider, MD  atorvastatin (LIPITOR) 80 MG tablet TAKE 1 TABLET (80 MG TOTAL) BY MOUTH EVERY EVENING. 06/08/15   Jerline Pain, MD  cephALEXin (KEFLEX) 500 MG capsule Take 1 capsule (500 mg total) by mouth 2 (two) times daily. 11/15/15   Sharlett Iles, MD  furosemide (LASIX) 40 MG tablet Take 80 mg by mouth 3 (three) times daily.  08/16/13   Historical Provider, MD  HYDROcodone-acetaminophen (NORCO/VICODIN) 5-325 MG tablet Take 1-2 tablets by mouth every 4 (four) hours as needed for moderate pain or severe pain. 11/15/15   Charlesetta Shanks, MD  insulin glargine (LANTUS) 100 UNIT/ML injection Inject 55-60 Units into the skin 2 (two) times daily. 55 units in the morning and 60 units at night    Historical Provider, MD  insulin lispro (HUMALOG) 100  UNIT/ML injection Inject 20-50 Units into the skin daily as needed for high blood sugar. PER SLIDING SCALE    Historical Provider, MD  isosorbide mononitrate (IMDUR) 30 MG 24 hr tablet TAKE 1 TABLET (30 MG TOTAL) BY MOUTH DAILY. 08/24/15   Jerline Pain, MD  Melatonin 10 MG TABS Take 10 mg by mouth at bedtime.     Historical Provider, MD  metoprolol succinate (TOPROL-XL) 100 MG 24 hr tablet TAKE 1 TABLET (100 MG TOTAL) BY MOUTH EVERY EVENING. TAKE WITH OR IMMEDIATELY FOLLOWING A MEAL. 06/08/15   Jerline Pain, MD  Multiple Vitamins-Minerals (PRESERVISION AREDS PO) Take 1 capsule by mouth 2 (two) times daily.    Historical Provider, MD  nitroGLYCERIN (NITROSTAT) 0.4 MG SL tablet Place 0.4 mg under the tongue every 5 (five) minutes as needed for chest pain.    Historical Provider, MD  omeprazole (PRILOSEC) 20 MG capsule Take 20 mg by mouth daily as needed (gurd).     Historical Provider, MD  oxyCODONE-acetaminophen (PERCOCET) 5-325 MG tablet Take 1-2 tablets by mouth every 6 (six) hours as needed. 11/15/15   Veryl Speak, MD  polyethylene glycol (MIRALAX / GLYCOLAX) packet Take 17 g by mouth daily. 06/08/13   Pamella Pert, MD  potassium chloride SA (K-DUR,KLOR-CON) 20 MEQ tablet Take 20 mEq by mouth daily.  08/16/13   Historical Provider, MD  Psyllium (VEGETABLE LAXATIVE PO) Take 4 tablets by mouth at bedtime.    Historical Provider, MD  SODIUM BICARBONATE PO Take 20 g by mouth 2 (two) times daily.    Historical Provider, MD    Allergies  Allergen Reactions  . Clarithromycin Itching  . Lisinopril Other (See Comments)    Increased creatinine levels (pt currently taking low dose)  . Metformin And Related Other (See Comments)    Increased creatinine levels    Physical Exam  Vitals  Blood pressure 120/66, pulse 100, temperature 98.9 F (37.2 C),  temperature source Rectal, resp. rate 22, SpO2 95 %.   General: Appears acutely ill and moderately toxic and unable to contribute to  history  Psych: Flat somewhat lethargic and confused affect, Awakens easily, Oriented X name only, Speech and thought patterns are not clear and appropriate patient is unable to answer simple questions as to whether she checks her sugars at home and would have they've been running, she also admits to visual hallucinations  Neuro:   No focal neurological deficits, CN II through XII intact, Strength estimated 5/5 all 4 extremities based on observation only, Sensation intact all 4 extremities.  ENT:  Ears and Eyes appear Normal, Conjunctivae clear, PER. Extremely oral mucosa without erythema or exudates.  Neck:  Supple, No lymphadenopathy appreciated  Respiratory:  Symmetrical chest wall movement, Good air movement bilaterally, CTAB. Room Air  Cardiac: Irregular with atrial fibrillation with variable ventricular rate but persistently greater than 100 bpm, No Murmurs, no LE edema noted, no JVD, No carotid bruits, peripheral pulses palpable at 2+  Abdomen:  Positive bowel sounds, Soft, minimally tender abdomen without guarding or rebounding, Non distended,  No masses appreciated, no obvious hepatosplenomegaly  Skin:  No Cyanosis, Normal Skin Turgor, No Skin Rash or Bruise.  Extremities: That's of prior right BKA with prosthesis in place  no effusions.  Data Review  CBC  Recent Labs Lab 11/14/15 2035 11/15/15 1231 11/15/15 1439 11/18/15 0409  WBC 7.6 12.4*  --  7.8  HGB 12.6 12.0 11.2* 10.6*  HCT 38.8 37.8 33.0* 34.2*  PLT 208 189  --  185  MCV 82.9 82.5  --  83.4  MCH 26.9 26.2  --  25.9*  MCHC 32.5 31.7  --  31.0  RDW 13.8 13.7  --  13.9  LYMPHSABS  --  0.9  --  1.1  MONOABS  --  0.7  --  0.7  EOSABS  --  0.0  --  0.1  BASOSABS  --  0.0  --  0.0    Chemistries   Recent Labs Lab 11/14/15 2035 11/15/15 1231 11/15/15 1439 11/18/15 0409  NA 132* 132* 137 125*  K 4.7 5.5* 5.0 6.9*  CL 98* 100* 102 97*  CO2 22 21*  --  16*  GLUCOSE 566* 480* 415* 532*  BUN 45* 43* 42*  71*  CREATININE 1.61* 1.52* 1.30* 2.96*  CALCIUM 9.8 9.6  --  9.0  AST 19  --   --   --   ALT 27  --   --   --   ALKPHOS 142*  --   --   --   BILITOT 0.7  --   --   --     estimated creatinine clearance is 17.4 mL/min (by C-G formula based on Cr of 2.96).  No results for input(s): TSH, T4TOTAL, T3FREE, THYROIDAB in the last 72 hours.  Invalid input(s): FREET3  Coagulation profile No results for input(s): INR, PROTIME in the last 168 hours.  No results for input(s): DDIMER in the last 72 hours.  Cardiac Enzymes No results for input(s): CKMB, TROPONINI, MYOGLOBIN in the last 168 hours.  Invalid input(s): CK  Invalid input(s): POCBNP  Urinalysis    Component Value Date/Time   COLORURINE YELLOW 11/18/2015 0343   APPEARANCEUR HAZY* 11/18/2015 0343   LABSPEC 1.015 11/18/2015 [place] 6.5 11/18/2015 [place] 11/18/2015 0343   HGBUR LARGE* 11/18/2015 0343   BILIRUBINUR SMALL* 11/18/2015 0343   KETONESUR NEGATIVE 11/18/2015 0343  PROTEINUR 100* 11/18/2015 0343   UROBILINOGEN 0.2 09/12/2014 0949   NITRITE NEGATIVE 11/18/2015 0343   LEUKOCYTESUR MODERATE* 11/18/2015 0343    Imaging results:   Dg Chest 2 View  11/14/2015  CLINICAL DATA:  Chest pain and shortness of breath for 2 days. Initial encounter. EXAM: CHEST  2 VIEW COMPARISON:  PA and lateral chest 10/05/2015 and 11/14/2012. FINDINGS: The lungs are clear. Heart size is normal. No pneumothorax or pleural effusion. Remote left rib fractures are noted. IMPRESSION: No acute disease. Electronically Signed   By: Inge Rise M.D.   On: 11/14/2015 21:20   Ct Chest Wo Contrast  11/15/2015  CLINICAL DATA:  Chest pain onset today. EXAM: CT CHEST WITHOUT CONTRAST TECHNIQUE: Multidetector CT imaging of the chest was performed following the standard protocol without IV contrast. COMPARISON:  Radiograph 3 hours prior. Included lung bases from CT abdomen/pelvis 10/05/2015 and 06/08/2013. FINDINGS:  Mediastinum/Lymph Nodes: Atherosclerotic calcifications of normal caliber thoracic aorta. Coronary artery calcifications versus coronary stent. Heart is normal in size. No pericardial effusion. No mediastinal or evidence of hilar adenopathy on noncontrast exam. There is a 3.5 cm hypodense nodule in the lower right thyroid gland extending to the isthmus. Lungs/Pleura: No consolidation. No pulmonary edema. Trachea and mainstem bronchi are patent. High-density tree and bud opacities in the right lower lobe, unchanged from prior exams dating back to 2014. Tiny subpleural nodules in the left lower lobe also unchanged dating back to 2014. Upper abdomen: No acute abnormality. Thinning of the included renal parenchyma. Pancreatic atrophy noted. Stomach distended with ingested contents. Musculoskeletal: Multilevel degenerative change throughout spine. Diffuse bony under mineralization. Remote left rib fractures. There are no acute or suspicious osseous abnormalities. IMPRESSION: 1. No acute intrathoracic process. 2. Aortic atherosclerosis, coronary artery calcifications versus stents. 3. Thyroid nodule on the right/isthmus measures 3.5 cm. Recommend nonemergent thyroid ultrasound. 4. Chronic right lung base opacities, unchanged dating back to 2014 and considered benign. This may be secondary to aspirated barium. Electronically Signed   By: Jeb Levering M.D.   On: 11/15/2015 00:36   Mr Cervical Spine Wo Contrast  11/15/2015  CLINICAL DATA:  Severe neck pain, acute on chronic. EXAM: MRI CERVICAL SPINE WITHOUT CONTRAST TECHNIQUE: Multiplanar, multisequence MR imaging of the cervical spine was performed. No intravenous contrast was administered. COMPARISON:  05/24/2010 FINDINGS: The study is motion degraded. Axial sequences are severely motion degraded and largely nondiagnostic with regards to assessment of the spinal canal and neural foramina. Vertebral alignment is unchanged. Vertebral body heights are preserved. No  gross vertebral marrow edema is identified. No signal abnormality is identified in the upper cervical cord. Evaluation of the mid to lower cervical cord is limited by motion artifact. Multiple thyroid nodules are partially visualized including up in approximately 4 cm nodule involving the medial right lobe/isthmus as described on the chest CT earlier today. There is mild disc space narrowing at C5-6. There is moderate narrowing at C6-7 which has progressed from the prior study. Moderate C7-T1 disc space narrowing also may have mildly progressed. Based on the sagittal images, there is suspected moderate spinal stenosis at C2-3, C3-4, C5-6, and C6-7 due to disc bulging, endplate spurring, and infolding of the ligamentum flavum. Multilevel facet arthrosis is present. Multilevel neural foraminal narrowing cannot be adequately assessed. IMPRESSION: Severely motion degraded examination as above. 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 Electronically Signed   By: Logan Bores M.D.   On: 11/15/2015 19:33   Dg Chest [facility]  1 View  11/18/2015  CLINICAL DATA:  Hypotension.  Tachycardia. EXAM: PORTABLE CHEST 1 VIEW COMPARISON:  11/14/2015 FINDINGS: There is unchanged borderline cardiomegaly. The lungs are clear. Is no large effusion. Pulmonary vasculature is normal. No interval change is evident. IMPRESSION: No active disease. Electronically Signed   By: Andreas Newport M.D.   On: 11/18/2015 04:35     EKG: (Independently reviewed)  Atrial fibrillation with ventricular (new) response 126 bpm, QTC 453 ms no ischemic changes noted and normal R-wave progression   Assessment & Plan  Principal Problem:   Type 2 diabetes mellitus with hyperosmolar nonketotic hyperglycemia  -Suspect multifactorial etiology: Patient with documented history of poorly controlled diabetes likely 2/2 noncompliance with diabetes medication; this possibly exacerbated by altered mentation  and possibly related to infectious process -Admit to stepdown/Inpatient -Patient has metabolic acidemia from acute renal failure with normal anion gap so will discontinue insulin infusion -Patient has pseudohyponatremia related to hyperglycemia -Resume reported home dose of Lantus insulin 60 units twice a day -Follow CBGs very 4 hours initially and provide resistant SSI -Check hemoglobin A1c -Volume replacement -Once mentation returned to baseline we'll need diabetes educator consultation  Active Problems:   AKI /CKD stage 3, GFR 30-59 ml/min with Acute hyperkalemia 2/2 DH -Baseline renal function February 2016 BUN 28 and creatinine 1.13 -On 4/4 as well as 4/5 patient was moderately azotemic with BUN in the 40s and creatinine 1.6 which had decreased to 1.3 on 4/5 after IV fluid bolus -Today BUN is 71 and creatinine is 2.96 -Patient also with acute hyperkalemia with baseline potassium of around 4.1 with noted upward trend and potassium beginning on 4/4 with potassium 4.7 and on 4/5 potassium 5.5 and down to 5.0 after fluid bolus during that visit -Current potassium 6.9 and was given cardioprotective medications as noted above -Based on med rec patient takes 80 mg of Lasix 3 times a day and unclear as to whether she has been taking this prior to admission with supplemental potassium 20 mEq-she is not on ACE inhibitor or ARB -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 and was on oral sodium bicarbonate -Patient has received 4 L of IV fluids so will recheck electrolyte panel and if potassium remains significantly elevated may require a dose of Kayexalate -Patient appears to be extremely dehydrated secondary to hyperglycemia so we'll continue normal saline IV for today 150 mL per hour noting she has normal LVEF based on previous echocardiogram 2014 and no documented evidence of LV hypertrophy or diastolic dysfunction on that echo    Atrial fibrillation with RVR  -Patient has  history of PAT -Suspect current atrial fibrillation related to volume depletion and likely patient will convert back to sinus rhythm once euvolemic -Blood pressure has been soft and rates have been between 100 and 120 bpm so we'll hold on rate control agents at this point to avoid rebound hypotension -Patient was also on Toprol-XL at home very high dose 100 mg daily and will need to resume once blood pressures become more stable -CHADVASC = 5 so will begin IV heparin with pharmacy dosing -Cardiology has been consulted by EDP -Check echo, TSH and cycle enzymes  **912am: SBP has remained stable so have initiated Diltiazem gtt orders w/ 20 mg bolus since HR have been increasing to 140s with resultant transient decrease SBP low 0000000     Metabolic encephalopathy -Again suspect a multifactorial etiology: Possible infection, hyponatremia, azotemia and most likely related to polypharmacy -In review of recent ER visits  patient was given a prescription for Vicodin on 4/4 as well as a prescription Percocet on 4/5 both of which she had filled on 4/5 and I'm concerned she may have been taking both medications -Check ammonia level and urine drug screen -Neuro exam nonfocal so do not suspect stroke/TIA at this juncture   Abnormal UA/SIRS -Patient was evaluated in ER 4/5 with abnormal urinalysis (urine obtained from urostomy) with subsequent urine culture growing multiple species -Was discharged on Keflex and uncertain if patient was actually taking at home -Current urinalysis remains abnormal and likely more reflective of dehydration state that as precaution I have begun empiric Rocephin -Follow up on blood cultures and urine culture -Initial lactate normal, she has no leukocytosis and has no fever although she does meet sirs criteria of heart rate greater than 90, PCO2 less than 32 she does have acute kidney injury and altered mentation but this is also in setting of HHNK and she did have hypotension which  subsequently has responded to fluids in setting of volume loss from [facility] so at this juncture I do not feel patient has sepsis    HTN  -Her blood pressures are suboptimal so holding preadmission Toprol, Imdur and diuretic    CAD  -Patient very confused and unable to delineate if having any chest pain -Evaluated for chest pain on 4/4 that was felt to be musculoskeletal in etiology and arising from the thoracic spine -In setting of new atrial fibrillation we'll cycle cardiac enzymes as precaution    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 -Continue routine urostomy care    Status post above knee amputation of right lower extremity  -May benefit from physical therapy and OT evaluation this admission    Anemia -Hemoglobin February 2016 was 8.7 -Hemoglobin markedly elevated around 12 on ER visits on 4/4 and 4/5 -Initial hemoglobin today 10.6 which is still reflective of hemoconcentration likely in setting of dehydration    DVT Prophylaxis: Heparin infusion  Family Communication:  No family at bedside  Code Status:  Full code based on previous encounters since patient unable to make that decision based on acute lack of capacity in setting of encephalopathy  Condition:  Guarded  Discharge disposition: Medically stable anticipate patient should return to previous home environment  Time spent in minutes : 60      Rabia Argote L. ANP on 11/18/2015 at 8:13 AM  You may contact me by going to [email or web address] - password [email or web address]  I am available from 7a-7p but please confirm I am on the schedule by going to Amion as above.   After 7p please contact night coverage person covering me after hours  [facility]

## 2015-11-19 ENCOUNTER — Inpatient Hospital Stay (HOSPITAL_COMMUNITY): Payer: PPO

## 2015-11-19 ENCOUNTER — Encounter (HOSPITAL_COMMUNITY): Payer: Self-pay | Admitting: Cardiology

## 2015-11-19 DIAGNOSIS — I480 Paroxysmal atrial fibrillation: Secondary | ICD-10-CM

## 2015-11-19 DIAGNOSIS — I509 Heart failure, unspecified: Secondary | ICD-10-CM | POA: Diagnosis not present

## 2015-11-19 DIAGNOSIS — I5031 Acute diastolic (congestive) heart failure: Secondary | ICD-10-CM | POA: Diagnosis not present

## 2015-11-19 DIAGNOSIS — G9341 Metabolic encephalopathy: Secondary | ICD-10-CM | POA: Diagnosis not present

## 2015-11-19 DIAGNOSIS — E1159 Type 2 diabetes mellitus with other circulatory complications: Secondary | ICD-10-CM | POA: Diagnosis not present

## 2015-11-19 DIAGNOSIS — R570 Cardiogenic shock: Secondary | ICD-10-CM | POA: Diagnosis not present

## 2015-11-19 DIAGNOSIS — A419 Sepsis, unspecified organism: Secondary | ICD-10-CM | POA: Diagnosis not present

## 2015-11-19 LAB — COMPREHENSIVE METABOLIC PANEL
ALT: 54 U/L (ref 14–54)
AST: 37 U/L (ref 15–41)
Albumin: 1.9 g/dL — ABNORMAL LOW (ref 3.5–5.0)
Alkaline Phosphatase: 146 U/L — ABNORMAL HIGH (ref 38–126)
Anion gap: 11 (ref 5–15)
BILIRUBIN TOTAL: 0.5 mg/dL (ref 0.3–1.2)
BUN: 56 mg/dL — AB (ref 6–20)
CALCIUM: 8.6 mg/dL — AB (ref 8.9–10.3)
CO2: 15 mmol/L — ABNORMAL LOW (ref 22–32)
Chloride: 106 mmol/L (ref 101–111)
Creatinine, Ser: 1.99 mg/dL — ABNORMAL HIGH (ref 0.44–1.00)
GFR calc Af Amer: 27 mL/min — ABNORMAL LOW (ref >60)
GFR, EST NON AFRICAN AMERICAN: 24 mL/min — AB (ref >60)
Glucose, Bld: 260 mg/dL — ABNORMAL HIGH (ref 65–99)
Potassium: 4.9 mmol/L (ref 3.5–5.1)
Sodium: 132 mmol/L — ABNORMAL LOW (ref 135–145)
TOTAL PROTEIN: 5.6 g/dL — AB (ref 6.5–8.1)

## 2015-11-19 LAB — GLUCOSE, CAPILLARY
GLUCOSE-CAPILLARY: 192 mg/dL — AB (ref 65–99)
GLUCOSE-CAPILLARY: 215 mg/dL — AB (ref 65–99)
GLUCOSE-CAPILLARY: 218 mg/dL — AB (ref 65–99)
GLUCOSE-CAPILLARY: 232 mg/dL — AB (ref 65–99)
Glucose-Capillary: 199 mg/dL — ABNORMAL HIGH (ref 65–99)
Glucose-Capillary: 207 mg/dL — ABNORMAL HIGH (ref 65–99)

## 2015-11-19 LAB — BLOOD GAS, ARTERIAL
ACID-BASE DEFICIT: 10.8 mmol/L — AB (ref 0.0–2.0)
BICARBONATE: 15.8 meq/L — AB (ref 20.0–24.0)
DRAWN BY: 36496
FIO2: 0.5
O2 SAT: 93 %
PATIENT TEMPERATURE: 98.6
PO2 ART: 72.8 mmHg — AB (ref 80.0–100.0)
TCO2: 17.1 mmol/L (ref 0–100)
pCO2 arterial: 42.2 mmHg (ref 35.0–45.0)
pH, Arterial: 7.198 — CL (ref 7.350–7.450)

## 2015-11-19 LAB — URINE CULTURE

## 2015-11-19 LAB — CBC
HCT: 30.8 % — ABNORMAL LOW (ref 36.0–46.0)
Hemoglobin: 9.7 g/dL — ABNORMAL LOW (ref 12.0–15.0)
MCH: 26.1 pg (ref 26.0–34.0)
MCHC: 31.5 g/dL (ref 30.0–36.0)
MCV: 82.8 fL (ref 78.0–100.0)
PLATELETS: 219 10*3/uL (ref 150–400)
RBC: 3.72 MIL/uL — ABNORMAL LOW (ref 3.87–5.11)
RDW: 14.2 % (ref 11.5–15.5)
WBC: 8 10*3/uL (ref 4.0–10.5)

## 2015-11-19 LAB — HEPARIN LEVEL (UNFRACTIONATED): Heparin Unfractionated: 0.53 IU/mL (ref 0.30–0.70)

## 2015-11-19 LAB — MAGNESIUM: Magnesium: 1.7 mg/dL (ref 1.7–2.4)

## 2015-11-19 MED ORDER — VANCOMYCIN HCL IN DEXTROSE 1-5 GM/200ML-% IV SOLN
1000.0000 mg | INTRAVENOUS | Status: DC
  Administered 2015-11-19 – 2015-11-23 (×5): 1000 mg via INTRAVENOUS
  Filled 2015-11-19 (×5): qty 200

## 2015-11-19 MED ORDER — METOPROLOL TARTRATE 25 MG PO TABS
25.0000 mg | ORAL_TABLET | Freq: Three times a day (TID) | ORAL | Status: DC
  Administered 2015-11-19 – 2015-11-20 (×3): 25 mg via ORAL
  Filled 2015-11-19 (×3): qty 1

## 2015-11-19 MED ORDER — INSULIN ASPART 100 UNIT/ML ~~LOC~~ SOLN
0.0000 [IU] | Freq: Three times a day (TID) | SUBCUTANEOUS | Status: DC
  Administered 2015-11-19: 4 [IU] via SUBCUTANEOUS
  Administered 2015-11-19 (×2): 7 [IU] via SUBCUTANEOUS

## 2015-11-19 MED ORDER — FUROSEMIDE 10 MG/ML IJ SOLN
40.0000 mg | Freq: Once | INTRAMUSCULAR | Status: AC
  Administered 2015-11-19: 40 mg via INTRAVENOUS
  Filled 2015-11-19: qty 4

## 2015-11-19 NOTE — Progress Notes (Signed)
[place] for Heparin Indication: atrial fibrillation  Allergies  Allergen Reactions  . Gabapentin Other (See Comments)    Extremely drunk and lethargic  . Lisinopril Other (See Comments)    Increased creatinine levels (pt currently taking low dose)  . Metformin And Related Other (See Comments)    Increased creatinine levels  . Biaxin [Clarithromycin] Itching    Patient Measurements: Height/Weight from 11/14/2015 Ht. 5'2" Wt. 89.8kg Heparin Dosing Weight: 70.8kg  Vital Signs: Temp: 97.7 F (36.5 C) (04/09 0813) Temp Source: Oral (04/09 0813) BP: 110/63 mmHg (04/09 0813) Pulse Rate: 129 (04/09 0813)  Labs:  Recent Labs  11/18/15 0409 11/18/15 0841 11/18/15 1614 11/18/15 1942 11/19/15 0445  HGB 10.6*  --   --   --  9.7*  HCT 34.2*  --   --   --  30.8*  PLT 185  --   --   --  219  HEPARINUNFRC  --   --  0.50  --  0.53  CREATININE 2.96* 2.63* 2.37*  --  1.99*  TROPONINI  --   --  0.08* 0.07*  --    Estimated Creatinine Clearance: 27.2 mL/min (by C-G formula based on Cr of 1.99).  Medical History: Past Medical History  Diagnosis Date  . Hypertension   . Hyperlipidemia   . History of atrial tachycardia CONTROLLED W/ LOPRESSOR  . Peripheral vascular disease (HCC) S/P RIGHT BKA  . Headache(784.0)   . Peripheral neuropathy (HCC) LEGS AND HANDS  . Arthritis   . 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 GERD (gastroesophageal reflux disease)   . Anemia   . Blood transfusion   . Diabetes mellitus ORAL AND INSULIN MEDS  . Macular degeneration of both eyes   . Glaucoma   . 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 Insomnia   . Constipation   . History of bladder cancer TCC AND CIS  . MI (mitral incompetence)   . Coronary artery disease   . Chronic kidney disease   . Cancer ([place])     bladder  . Myocardial infarction ([place])   . Presence of urostomy [facility])    Assessment: This patient is known  to pharmacy from past heparin therapy (2014).  She presents with atrial fibrillation and we have been asked to initiate IV heparin for her.  She required higher than calculated dose using adjusted body weight in the past.  We will be cautious though as her SCr was elevated on admission at 2.96 > now improving with SCr of 1.99.  She has an estimated clearance ~ 25ml/min.    Heparin level = 0.53 at goal  Goal of Therapy:  Heparin level 0.3-0.7 units/ml Monitor platelets by anticoagulation protocol: Yes   Plan:  Continue Heparin infusion at 1300 units / hr Daily Heparin level, CBC  Melburn Popper, PharmD Clinical Pharmacy Resident Pager: [phone] 11/19/2015 10:48 AM

## 2015-11-19 NOTE — Consult Note (Addendum)
Cardiology Consult Note  Admit date: 11/18/2015 Name: [name] 75 y.o.  female DOB:  02/22/1941 MRN:  [id number]  Today's date:  11/19/2015  Referring Physician:    [facility]  Primary Physician:   Dr. Leighton Ruff   Reason for Consultation:    Rapid atrial fibrillation   IMPRESSIONS: 1.  Paroxysmal atrial fibrillation with new onset this admission in the setting of infection rate is still somewhat rapid 2.  History of coronary artery disease with previous circumflex stent according to old records but incompletely documentation of extensive disease 3.  Diabetes mellitus with peripheral vascular disease, peripheral neuropathy and previous amputation 4.  Hypertension previously 5.  Hyperlipidemia 6.  History of bladder cancer with urostomy 7.  Anemia 8.  Chronic kidney disease reportedly stage III  RECOMMENDATION: 1.  anticoagulation with heparin for the time being.  Diltiazem to control heart rate and consider adding metoprolol to help rate control.  If rate remains up her blood pressure is an issue continues dig to help with rate control or begin on amiodarone.   2.  Echocardiogram to assess left atrial size and LV function 3.  Treat sepsis and infection 4.  Evaluate anemia   HISTORY: This complicated 75 year old female has a history of bladder cancer and has a urostomy bag.  She has a history of severe diabetes with nephropathy and neuropathy and previous amputations.  She had an infarction after her bladder cancer and according to the chart is had a stent in the circumflex in the past and had a negative myocardial perfusion scan a few years ago.  She was in the emergency room a few days ago with some atypical chest pain and then had neck pain and had significant hyperglycemia.  She was treated with Keflex as an outpatient and then returned to the emergency room yesterday with altered mentation.  The history is been quite limited as the patient is confused.  She had been  noncompliant with medicines for UTI and was hallucinating confused when she was brought in yesterday with hypotension.  Her blood pressure improved with the fluid bolus and she was noted to be in new onset of atrial fibrillation with rapid response.  She was in acute renal failure when she came in which has improved with hydration.  She currently complains of some bilateral arm pain.  There were no ischemic changes on her EKG that showed atrial fibrillation with rapid response.  There is a history of atrial tachycardia in the past but no prior history of atrial fibrillation in the past.  LV function is evidently been preserved in the past.  Past Medical History  Diagnosis Date  . Hypertension   . Hyperlipidemia   . History of atrial tachycardia   . Peripheral vascular disease (HCC) S/P RIGHT BKA  . Peripheral neuropathy (HCC) LEGS AND HANDS  . 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 GERD (gastroesophageal reflux disease)   . Anemia   . Blood transfusion   . Macular degeneration of both eyes   . Glaucoma   . Retinopathy due to secondary diabetes mellitus ([place])   . History of bladder cancer TCC AND CIS  . Myocardial infarction ([place])   . Presence of urostomy ([place])   . Type 2 diabetes mellitus with vascular disease ([place])   . CKD (chronic kidney disease) stage 3, GFR 30-59 ml/min 01/26/2013  . Obesity (BMI 30-39.9)   . CAD (coronary artery disease) 01/26/2013    Evidently circumflex stent  in 2014 following bladder cancer surgery for old myocardial infarction.       Past Surgical History  Procedure Laterality Date  . I & d right below knee amputation wound  06-10-2011  . Cysto / resection bladder bx's  04-01-11  &  11-26-10  . Below knee leg amputation  05-02-2007    RIGHT  . Right foot i & d / removal necrotic bone  JUN  &  AUG 2008  . Right great toe amputation  11-05-2006  . Laparoscopic cholecystectomy  12-10-1999  . Back surgery  [place]  . Appendectomy  1963   . Cataract extraction w/ intraocular lens  implant, bilateral    . Cystoscopy with biopsy  10/14/2011    Procedure: CYSTOSCOPY WITH BIOPSY;  Surgeon: Claybon Jabs, MD;  Location[facility];  Service: Urology;  Laterality: N/A;  BLADDER BIOPSY  . Coronary stent placement    . Abdominal hysterectomy    . Revision urostomy cutaneous    . Revision urostomy cutaneous    . Abdominal angiogram N/A 09/08/2014    Procedure: ABDOMINAL ANGIOGRAM;  Surgeon: Conrad Gearhart, MD;  [facility] C[facility] LAB;  Service: Cardiovascular;  Laterality: N/A;  . Femoral-popliteal bypass graft Left 09/12/2014    Procedure:  LEFT FEMORAL-POPLITEAL ARTERY BYPASS GRAFT USING NON REVERSE LEFT GREATER SAPHENOUS VEIN;  Surgeon: Rosetta Posner, MD;  Location: [place];  Service: Vascular;  Laterality: Left;  . Amputation Left 09/14/2014    Procedure: LEFT FOOT FIRST RAY AMPUTATION;  Surgeon: Mcarthur Rossetti, MD;  Location: [place];  Service: Orthopedics;  Laterality: Left;     Allergies:  is allergic to gabapentin; lisinopril; metformin and related; and biaxin.   Medications: Prior to Admission medications   Medication Sig Start Date End Date Taking? Authorizing Provider  acetaminophen (TYLENOL) 500 MG tablet Take 1,000 mg by mouth every 6 (six) hours as needed for mild pain.    Yes Historical Provider, MD  aspirin EC 81 MG tablet Take 81 mg by mouth every evening.   Yes Historical Provider, MD  atorvastatin (LIPITOR) 80 MG tablet TAKE 1 TABLET (80 MG TOTAL) BY MOUTH EVERY EVENING. 06/08/15  Yes Jerline Pain, MD  cephALEXin (KEFLEX) 500 MG capsule Take 1 capsule (500 mg total) by mouth 2 (two) times daily. 11/15/15  Yes Sharlett Iles, MD  furosemide (LASIX) 40 MG tablet Take 80 mg by mouth 3 (three) times daily.  08/16/13  Yes Historical Provider, MD  HYDROcodone-acetaminophen (NORCO/VICODIN) 5-325 MG tablet Take 1-2 tablets by mouth every 4 (four) hours as needed for moderate pain or severe pain. 11/15/15  Yes  Charlesetta Shanks, MD  insulin glargine (LANTUS) 100 UNIT/ML injection Inject 55-60 Units into the skin 2 (two) times daily. 55 units in the morning and 60 units at night   Yes Historical Provider, MD  insulin lispro (HUMALOG) 100 UNIT/ML injection Inject 20-50 Units into the skin daily as needed for high blood sugar. PER SLIDING SCALE   Yes Historical Provider, MD  isosorbide mononitrate (IMDUR) 30 MG 24 hr tablet TAKE 1 TABLET (30 MG TOTAL) BY MOUTH DAILY. 08/24/15  Yes Jerline Pain, MD  Magnesium 300 MG CAPS Take 300 mg by mouth daily.   Yes Historical Provider, MD  Melatonin 10 MG TABS Take 10 mg by mouth at bedtime.    Yes Historical Provider, MD  metoprolol succinate (TOPROL-XL) 100 MG 24 hr tablet TAKE 1 TABLET (100 MG TOTAL)  BY MOUTH EVERY EVENING. TAKE WITH OR IMMEDIATELY FOLLOWING A MEAL. 06/08/15  Yes Jerline Pain, MD  Multiple Vitamins-Minerals (PRESERVISION AREDS PO) Take 1 capsule by mouth 2 (two) times daily.   Yes Historical Provider, MD  nitroGLYCERIN (NITROSTAT) 0.6 MG SL tablet Take 0.6 mg by mouth every 5 (five) minutes x 3 doses as needed.   Yes Historical Provider, MD  omeprazole (PRILOSEC) 20 MG capsule Take 20 mg by mouth every evening.    Yes Historical Provider, MD  oxyCODONE-acetaminophen (PERCOCET) 5-325 MG tablet Take 1-2 tablets by mouth every 6 (six) hours as needed. Patient taking differently: Take 1-2 tablets by mouth every 6 (six) hours as needed for severe pain.  11/15/15  Yes Veryl Speak, MD  polyethylene glycol (MIRALAX / GLYCOLAX) packet Take 17 g by mouth daily. Patient taking differently: Take 17 g by mouth daily as needed for moderate constipation.  06/08/13  Yes Pamella Pert, MD  potassium chloride SA (K-DUR,KLOR-CON) 20 MEQ tablet Take 20 mEq by mouth daily.  08/16/13  Yes Historical Provider, MD  Psyllium (VEGETABLE LAXATIVE PO) Take 4 tablets by mouth at bedtime.   Yes Historical Provider, MD  senna (SENOKOT) 8.6 MG TABS tablet Take 1 tablet by mouth See  admin instructions. Takes 1 tab daily and can take addt'l tab as needed for constipation   Yes Historical Provider, MD  sodium bicarbonate 650 MG tablet Take 1,300 mg by mouth 2 (two) times daily.   Yes Historical Provider, MD    Family History: Family Status  Relation Status Death Age  . Mother Deceased 45    CHF  . Father Deceased 25    sudden death  . Maternal Grandmother Deceased   . Maternal Grandfather Deceased   . Paternal Grandmother Deceased   . Paternal Grandfather Deceased     Social History:   reports that she quit smoking about 40 years ago. Her smoking use included Cigarettes. She has a 34 pack-year smoking history. She has never used smokeless tobacco. She reports that she drinks alcohol. She reports that she does not use illicit drugs.   Social History   Social History Narrative    Review of Systems: Not easily obtainable due to her current mental status and inability to give an accurate history  Physical Exam: BP 129/70 mmHg  Pulse 121  Temp(Src) 98.5 F (36.9 C) (Oral)  Resp 14  Wt 98.3 kg (216 lb 11.4 oz)  SpO2 92%  General appearance: She is a pale-appearing elderly female who is not able to give a coherent history and speaks softly, not in any acute distress Head: Normocephalic, without obvious abnormality, atraumatic Eyes: conjunctivae/corneas clear. PERRL, EOM's intact. Fundi benign. Neck: no adenopathy, no carotid bruit, supple, symmetrical, trachea midline and JVD appears to be mildly elevated at 30 Lungs: Reduced breath sounds bilaterally Heart: Rapid irregular rhythm, normal S1 and S2, no S3, no definite murmur appreciated Abdomen: soft, non-tender; bowel sounds normal; no masses,  no organomegaly urostomy bag is present Extremities: Right BKA present, amputation of the left toe noted, peripheral pulses are diminished on the left Pulses: Femoral pulses are present with faint bruits, distal pulses diminished on the left Skin: Somewhat pale, no  lesions. Neurologic: She is oriented to the hospital but unclear on the date, history is slow and difficult, no gross neurologic abnormalities   Labs: CBC  Recent Labs  11/18/15 0409 11/19/15 0445  WBC 7.8 8.0  RBC 4.10 3.72*  HGB 10.6* 9.7*  HCT 34.2* 30.8*  PLT 185 219  MCV 83.4 82.8  MCH 25.9* 26.1  MCHC 31.0 31.5  RDW 13.9 14.2  LYMPHSABS 1.1  --   MONOABS 0.7  --   EOSABS 0.1  --   BASOSABS 0.0  --    CMP   Recent Labs  11/19/15 0445  NA 132*  K 4.9  CL 106  CO2 15*  GLUCOSE 260*  BUN 56*  CREATININE 1.99*  CALCIUM 8.6*  PROT 5.6*  ALBUMIN 1.9*  AST 37  ALT 54  ALKPHOS 146*  BILITOT 0.5  GFRNONAA 24*  GFRAA 27*   Cardiac Panel (last 3 results) Cardiac Panel (last 3 results)  Recent Labs  11/18/15 1614 11/18/15 1942  TROPONINI 0.08* 0.07*    Radiology:  Borderline cardiomegaly, no active disease  EKG: Atrial fibrillation with rapid ventricular response, nonspecific ST and T-wave changes.  Signed:  Kerry Hough MD [facility]   Cardiology Consultant  11/19/2015, 12:38 PM

## 2015-11-19 NOTE — Progress Notes (Signed)
Pharmacy Antibiotic Note  [name] is a 75 y.o. female admitted on 11/18/2015 with hyperglycemia/hyperkalemia, now w/ GPC in clusters bacteremia.  Pharmacy has been consulted for vancomycin dosing.  Plan: Vancomycin 1000 IV every 24 hours.  Goal trough 15-20 mcg/mL.   Temp (24hrs), Avg:98.2 F (36.8 C), Min:97.6 F (36.4 C), Max:98.9 F (37.2 C)   Recent Labs Lab 11/14/15 2035 11/15/15 1228 11/15/15 1231 11/15/15 1439 11/18/15 0348 11/18/15 0409 11/18/15 0841 11/18/15 1614  WBC 7.6  --  12.4*  --   --  7.8  --   --   CREATININE 1.61*  --  1.52* 1.30*  --  2.96* 2.63* 2.37*  LATICACIDVEN  --  1.50  --   --  1.67  --  1.9 1.2    Estimated Creatinine Clearance: 21.7 mL/min (by C-G formula based on Cr of 2.37).    Allergies  Allergen Reactions  . Gabapentin Other (See Comments)    Extremely drunk and lethargic  . Lisinopril Other (See Comments)    Increased creatinine levels (pt currently taking low dose)  . Metformin And Related Other (See Comments)    Increased creatinine levels  . Biaxin [Clarithromycin] Itching    Antimicrobials this admission: Rocephin 4/8 >>  Vancomycin 4/9 >>    Microbiology results: 4/8 BCx: GPC in clusters 4/5 UCx: multiple spp  4/8 MRSA PCR: negative  Thank you for allowing pharmacy to be a part of this patient's care.  Wynona Neat, PharmD, BCPS  11/19/2015 3:53 AM

## 2015-11-19 NOTE — Progress Notes (Signed)
[facility] PROGRESS NOTE  [name] [id number] DOB: 01/13/1941 DOA: 11/18/2015 PCP: Gerrit Heck, MD  Assessment/Plan: Principal Problem:    Atrial fibrillation with RVR ([place]) - Consulted cardiology with assistance for further management - Currently on diltiazem and heparin  Sepsis - continue antibiotic regimen - will f/u with blood cultures.  UTI - Continue Rocephin and vancomycin  Type 2 diabetes mellitus with vascular disease ([place]) - Continue Lantus and SSI  Active Problems:   History of bladder cancer   HTN (hypertension)   CAD (coronary artery disease)   CKD (chronic kidney disease) stage 3, GFR 30-59 ml/min   Dehydration   Status post above knee amputation of right lower extremity (HCC)   Anemia     AKI (acute kidney injury) ([place]) - Improving currently. Most likely prerenal etiology.    Metabolic encephalopathy - Improving    Acute hyperkalemia - Resolved   Code Status: Full Family Communication: Discussed directly with patient Disposition Plan: Pending improvement in condition   Consultants:  Cardiology  Procedures:  None  Antibiotics:  Rocephin and vancomycin  HPI/Subjective: Patient has no new complaints.  Objective: Filed Vitals:   11/19/15 0813 11/19/15 1202  BP: 110/63 129/70  Pulse: 129 121  Temp: 97.7 F (36.5 C) 98.5 F (36.9 C)  Resp: 15 14    Intake/Output Summary (Last 24 hours) at 11/19/15 1633 Last data filed at 11/19/15 1500  Gross per 24 hour  Intake   6671 ml  Output   1625 ml  Net   5046 ml   Filed Weights   11/19/15 0439  Weight: 98.3 kg (216 lb 11.4 oz)    Exam:   General:  Patient in no acute distress, alert and awake  Cardiovascular: S1 and S2 within normal limits  Respiratory: No increased work of breathing, no wheezes  Abdomen: Soft, nondistended  Musculoskeletal: No cyanosis or clubbing   Data Reviewed: Basic Metabolic Panel:  Recent Labs Lab 11/15/15 1231  11/15/15 1439 11/18/15 0409 11/18/15 0841 11/18/15 1614 11/19/15 0445 11/19/15 0519  NA 132* 137 125* 133* 131* 132*  --   K 5.5* 5.0 6.9* 5.7* 5.2* 4.9  --   CL 100* 102 97* 107 105 106  --   CO2 21*  --  16* 16* 16* 15*  --   GLUCOSE 480* 415* 532* 312* 286* 260*  --   BUN 43* 42* 71* 64* 63* 56*  --   CREATININE 1.52* 1.30* 2.96* 2.63* 2.37* 1.99*  --   CALCIUM 9.6  --  9.0 8.4* 8.9  8.8* 8.6*  --   MG  --   --   --   --  1.4*  --  1.7  PHOS  --   --   --   --  3.7  --   --    Liver Function Tests:  Recent Labs Lab 11/14/15 2035 11/19/15 0445  AST 19 37  ALT 27 54  ALKPHOS 142* 146*  BILITOT 0.7 0.5  PROT 7.0 5.6*  ALBUMIN 3.3* 1.9*    Recent Labs Lab 11/14/15 2035  LIPASE 30    Recent Labs Lab 11/18/15 0841  AMMONIA 18   CBC:  Recent Labs Lab 11/14/15 2035 11/15/15 1231 11/15/15 1439 11/18/15 0409 11/19/15 0445  WBC 7.6 12.4*  --  7.8 8.0  NEUTROABS  --  10.7*  --  5.9  --   HGB 12.6 12.0 11.2* 10.6* 9.7*  HCT 38.8 37.8 33.0* 34.2* 30.8*  MCV 82.9 82.5  --  83.4 82.8  PLT 208 189  --  185 219   Cardiac Enzymes:  Recent Labs Lab 11/18/15 1614 11/18/15 1942  TROPONINI 0.08* 0.07*   BNP (last 3 results) No results for input(s): BNP in the last 8760 hours.  ProBNP (last 3 results) No results for input(s): PROBNP in the last 8760 hours.  CBG:  Recent Labs Lab 11/18/15 1939 11/18/15 2354 11/19/15 0442 11/19/15 0811 11/19/15 1206  GLUCAP 181* 192* 199* 232* 207*    Recent Results (from the past 240 hour(s))  Urine culture     Status: None   Collection Time: 11/15/15  1:35 PM  Result Value Ref Range Status   Specimen Description URINE, CATHETERIZED  Final   Special Requests NONE  Final   Culture MULTIPLE SPECIES PRESENT, SUGGEST RECOLLECTION  Final   Report Status 11/16/2015 FINAL  Final  Culture, blood (Routine X 2) w Reflex to ID Panel     Status: None (Preliminary result)   Collection Time: 11/18/15  3:23 AM  Result Value  Ref Range Status   Specimen Description BLOOD RIGHT WRIST  Final   Special Requests   Final    BOTTLES DRAWN AEROBIC AND ANAEROBIC 5CC AER [place] ANA   Culture NO GROWTH 1 DAY  Final   Report Status PENDING  Incomplete  Culture, blood (Routine X 2) w Reflex to ID Panel     Status: None (Preliminary result)   Collection Time: 11/18/15  3:25 AM  Result Value Ref Range Status   Specimen Description BLOOD LEFT WRIST  Final   Special Requests IN PEDIATRIC BOTTLE 6CC  Final   Culture  Setup Time   Final    GRAM POSITIVE COCCI IN CLUSTERS IN PEDIATRIC BOTTLE CRITICAL RESULT CALLED TO, READ BACK BY AND VERIFIED WITH: Nicholes Calamity, [facility]    Culture GRAM POSITIVE COCCI  Final   Report Status PENDING  Incomplete  Urine culture     Status: None   Collection Time: 11/18/15  4:58 AM  Result Value Ref Range Status   Specimen Description URINE, RANDOM  Final   Special Requests BAG(PED)  Final   Culture MULTIPLE SPECIES PRESENT, SUGGEST RECOLLECTION  Final   Report Status 11/19/2015 FINAL  Final  MRSA PCR Screening     Status: None   Collection Time: 11/18/15  2:42 PM  Result Value Ref Range Status   MRSA by PCR NEGATIVE NEGATIVE Final    Comment:        The GeneXpert MRSA Assay (FDA approved for NASAL specimens only), is one component of a comprehensive MRSA colonization surveillance program. It is not intended to diagnose MRSA infection nor to guide or monitor treatment for MRSA infections.      Studies: Dg Chest Port 1 View  11/18/2015  CLINICAL DATA:  Hypotension.  Tachycardia. EXAM: PORTABLE CHEST 1 VIEW COMPARISON:  11/14/2015 FINDINGS: There is unchanged borderline cardiomegaly. The lungs are clear. Is no large effusion. Pulmonary vasculature is normal. No interval change is evident. IMPRESSION: No active disease. Electronically Signed   By: Andreas Newport M.D.   On: 11/18/2015 04:35    Scheduled Meds: . cefTRIAXone (ROCEPHIN)  IV  1 g Intravenous Q24H  .  insulin aspart  0-20 Units Subcutaneous TID AC & HS  . insulin glargine  60 Units Subcutaneous BID  . metoprolol tartrate  25 mg Oral Q8H  . sodium chloride flush  3 mL Intravenous Q12H  . vancomycin  1,000 mg Intravenous Q24H   Continuous  Infusions: . sodium chloride 100 mL/hr (11/19/15 1300)  . diltiazem (CARDIZEM) infusion 15 mg/hr (11/19/15 0700)  . heparin 1,300 Units/hr (11/19/15 0700)   Time spent: > 35 minutes  Velvet Bathe  [facility] Pager [phone]. If 7PM-7AM, please contact night-coverage at [email or web address], password [facility] 11/19/2015, 4:33 PM  LOS: 1 day

## 2015-11-20 ENCOUNTER — Inpatient Hospital Stay (HOSPITAL_COMMUNITY): Payer: PPO

## 2015-11-20 DIAGNOSIS — I10 Essential (primary) hypertension: Secondary | ICD-10-CM | POA: Diagnosis not present

## 2015-11-20 DIAGNOSIS — I5031 Acute diastolic (congestive) heart failure: Secondary | ICD-10-CM | POA: Diagnosis not present

## 2015-11-20 DIAGNOSIS — J90 Pleural effusion, not elsewhere classified: Secondary | ICD-10-CM | POA: Diagnosis not present

## 2015-11-20 DIAGNOSIS — A419 Sepsis, unspecified organism: Secondary | ICD-10-CM | POA: Diagnosis not present

## 2015-11-20 DIAGNOSIS — G9341 Metabolic encephalopathy: Secondary | ICD-10-CM | POA: Diagnosis not present

## 2015-11-20 DIAGNOSIS — Z452 Encounter for adjustment and management of vascular access device: Secondary | ICD-10-CM | POA: Diagnosis not present

## 2015-11-20 DIAGNOSIS — R0602 Shortness of breath: Secondary | ICD-10-CM | POA: Diagnosis not present

## 2015-11-20 DIAGNOSIS — J9601 Acute respiratory failure with hypoxia: Secondary | ICD-10-CM

## 2015-11-20 DIAGNOSIS — I480 Paroxysmal atrial fibrillation: Secondary | ICD-10-CM | POA: Diagnosis not present

## 2015-11-20 DIAGNOSIS — E872 Acidosis: Secondary | ICD-10-CM

## 2015-11-20 DIAGNOSIS — R570 Cardiogenic shock: Secondary | ICD-10-CM | POA: Diagnosis not present

## 2015-11-20 DIAGNOSIS — I2510 Atherosclerotic heart disease of native coronary artery without angina pectoris: Secondary | ICD-10-CM

## 2015-11-20 DIAGNOSIS — I4891 Unspecified atrial fibrillation: Secondary | ICD-10-CM

## 2015-11-20 DIAGNOSIS — R001 Bradycardia, unspecified: Secondary | ICD-10-CM | POA: Insufficient documentation

## 2015-11-20 DIAGNOSIS — N179 Acute kidney failure, unspecified: Secondary | ICD-10-CM | POA: Diagnosis not present

## 2015-11-20 DIAGNOSIS — G9340 Encephalopathy, unspecified: Secondary | ICD-10-CM

## 2015-11-20 DIAGNOSIS — R8290 Unspecified abnormal findings in urine: Secondary | ICD-10-CM | POA: Diagnosis not present

## 2015-11-20 DIAGNOSIS — E1159 Type 2 diabetes mellitus with other circulatory complications: Secondary | ICD-10-CM | POA: Diagnosis not present

## 2015-11-20 LAB — COMPREHENSIVE METABOLIC PANEL
ALT: 57 U/L — ABNORMAL HIGH (ref 14–54)
ANION GAP: 8 (ref 5–15)
AST: 54 U/L — ABNORMAL HIGH (ref 15–41)
Albumin: 1.9 g/dL — ABNORMAL LOW (ref 3.5–5.0)
Alkaline Phosphatase: 168 U/L — ABNORMAL HIGH (ref 38–126)
BUN: 49 mg/dL — ABNORMAL HIGH (ref 6–20)
CHLORIDE: 109 mmol/L (ref 101–111)
CO2: 15 mmol/L — AB (ref 22–32)
Calcium: 8.7 mg/dL — ABNORMAL LOW (ref 8.9–10.3)
Creatinine, Ser: 1.75 mg/dL — ABNORMAL HIGH (ref 0.44–1.00)
GFR, EST AFRICAN AMERICAN: 32 mL/min — AB (ref >60)
GFR, EST NON AFRICAN AMERICAN: 28 mL/min — AB (ref >60)
Glucose, Bld: 374 mg/dL — ABNORMAL HIGH (ref 65–99)
Potassium: 6 mmol/L — ABNORMAL HIGH (ref 3.5–5.1)
SODIUM: 132 mmol/L — AB (ref 135–145)
Total Bilirubin: 0.5 mg/dL (ref 0.3–1.2)
Total Protein: 5.7 g/dL — ABNORMAL LOW (ref 6.5–8.1)

## 2015-11-20 LAB — BRAIN NATRIURETIC PEPTIDE
B NATRIURETIC PEPTIDE 5: 344.5 pg/mL — AB (ref 0.0–100.0)
B Natriuretic Peptide: 463.1 pg/mL — ABNORMAL HIGH (ref 0.0–100.0)

## 2015-11-20 LAB — LACTIC ACID, PLASMA
LACTIC ACID, VENOUS: 1.4 mmol/L (ref 0.5–2.0)
Lactic Acid, Venous: 0.9 mmol/L (ref 0.5–2.0)
Lactic Acid, Venous: 0.8 mmol/L (ref 0.5–2.0)
Lactic Acid, Venous: 1 mmol/L (ref 0.5–2.0)

## 2015-11-20 LAB — CBC WITH DIFFERENTIAL/PLATELET
Basophils Absolute: 0 10*3/uL (ref 0.0–0.1)
Basophils Relative: 0 %
EOS ABS: 0 10*3/uL (ref 0.0–0.7)
EOS PCT: 0 %
HCT: 32.7 % — ABNORMAL LOW (ref 36.0–46.0)
Hemoglobin: 10 g/dL — ABNORMAL LOW (ref 12.0–15.0)
LYMPHS ABS: 0.4 10*3/uL — AB (ref 0.7–4.0)
Lymphocytes Relative: 4 %
MCH: 25.8 pg — AB (ref 26.0–34.0)
MCHC: 30.6 g/dL (ref 30.0–36.0)
MCV: 84.5 fL (ref 78.0–100.0)
MONO ABS: 0.7 10*3/uL (ref 0.1–1.0)
MONOS PCT: 7 %
Neutro Abs: 9.4 10*3/uL — ABNORMAL HIGH (ref 1.7–7.7)
Neutrophils Relative %: 89 %
PLATELETS: 276 10*3/uL (ref 150–400)
RBC: 3.87 MIL/uL (ref 3.87–5.11)
RDW: 14.4 % (ref 11.5–15.5)
WBC: 10.5 10*3/uL (ref 4.0–10.5)

## 2015-11-20 LAB — BASIC METABOLIC PANEL
ANION GAP: 11 (ref 5–15)
Anion gap: 9 (ref 5–15)
BUN: 49 mg/dL — AB (ref 6–20)
BUN: 48 mg/dL — ABNORMAL HIGH (ref 6–20)
CALCIUM: 9.1 mg/dL (ref 8.9–10.3)
CHLORIDE: 108 mmol/L (ref 101–111)
CO2: 16 mmol/L — AB (ref 22–32)
CO2: 17 mmol/L — AB (ref 22–32)
CREATININE: 1.43 mg/dL — AB (ref 0.44–1.00)
Calcium: 9.4 mg/dL (ref 8.9–10.3)
Chloride: 108 mmol/L (ref 101–111)
Creatinine, Ser: 1.47 mg/dL — ABNORMAL HIGH (ref 0.44–1.00)
GFR calc Af Amer: 39 mL/min — ABNORMAL LOW (ref >60)
GFR calc non Af Amer: 34 mL/min — ABNORMAL LOW (ref >60)
GFR calc non Af Amer: 35 mL/min — ABNORMAL LOW (ref >60)
GFR, EST AFRICAN AMERICAN: 41 mL/min — AB (ref >60)
GLUCOSE: 239 mg/dL — AB (ref 65–99)
Glucose, Bld: 302 mg/dL — ABNORMAL HIGH (ref 65–99)
POTASSIUM: 5.2 mmol/L — AB (ref 3.5–5.1)
Potassium: 3.8 mmol/L (ref 3.5–5.1)
Sodium: 133 mmol/L — ABNORMAL LOW (ref 135–145)
Sodium: 136 mmol/L (ref 135–145)

## 2015-11-20 LAB — POCT I-STAT 3, ART BLOOD GAS (G3+)
Acid-base deficit: 16 mmol/L — ABNORMAL HIGH (ref 0.0–2.0)
Bicarbonate: 13.6 mEq/L — ABNORMAL LOW (ref 20.0–24.0)
O2 Saturation: 59 %
PCO2 ART: 47.3 mmHg — AB (ref 35.0–45.0)
PH ART: 7.064 — AB (ref 7.350–7.450)
Patient temperature: 97.8
TCO2: 15 mmol/L (ref 0–100)
pO2, Arterial: 42 mmHg — ABNORMAL LOW (ref 80.0–100.0)

## 2015-11-20 LAB — CBC
HCT: 34.1 % — ABNORMAL LOW (ref 36.0–46.0)
HEMOGLOBIN: 10.6 g/dL — AB (ref 12.0–15.0)
MCH: 26 pg (ref 26.0–34.0)
MCHC: 31.1 g/dL (ref 30.0–36.0)
MCV: 83.6 fL (ref 78.0–100.0)
Platelets: 293 10*3/uL (ref 150–400)
RBC: 4.08 MIL/uL (ref 3.87–5.11)
RDW: 14.6 % (ref 11.5–15.5)
WBC: 9.1 10*3/uL (ref 4.0–10.5)

## 2015-11-20 LAB — GLUCOSE, CAPILLARY
GLUCOSE-CAPILLARY: 325 mg/dL — AB (ref 65–99)
GLUCOSE-CAPILLARY: 283 mg/dL — AB (ref 65–99)
Glucose-Capillary: 190 mg/dL — ABNORMAL HIGH (ref 65–99)
Glucose-Capillary: 233 mg/dL — ABNORMAL HIGH (ref 65–99)
Glucose-Capillary: 241 mg/dL — ABNORMAL HIGH (ref 65–99)

## 2015-11-20 LAB — TROPONIN I: Troponin I: <0.03 ng/mL (ref <0.031)

## 2015-11-20 LAB — HEMOGLOBIN A1C
Hgb A1c MFr Bld: 14.8 % — ABNORMAL HIGH (ref 4.8–5.6)
Mean Plasma Glucose: 378 mg/dL

## 2015-11-20 LAB — HEPARIN LEVEL (UNFRACTIONATED): Heparin Unfractionated: 0.57 IU/mL (ref 0.30–0.70)

## 2015-11-20 LAB — ECHOCARDIOGRAM COMPLETE: WEIGHTICAEL: 3442.7 [oz_av]

## 2015-11-20 LAB — MAGNESIUM
MAGNESIUM: 1.7 mg/dL (ref 1.7–2.4)
MAGNESIUM: 1.5 mg/dL — AB (ref 1.7–2.4)

## 2015-11-20 LAB — PHOSPHORUS: PHOSPHORUS: 5.4 mg/dL — AB (ref 2.5–4.6)

## 2015-11-20 MED ORDER — DOPAMINE-DEXTROSE 3.2-5 MG/ML-% IV SOLN
INTRAVENOUS | Status: AC
  Administered 2015-11-20: 5 ug/kg/min via INTRAVENOUS
  Filled 2015-11-20: qty 250

## 2015-11-20 MED ORDER — FUROSEMIDE 10 MG/ML IJ SOLN: 40.0000 mg | Freq: Two times a day (BID) | INTRAMUSCULAR | Status: DC

## 2015-11-20 MED ORDER — SODIUM CHLORIDE 0.9% FLUSH
10.0000 mL | Freq: Two times a day (BID) | INTRAVENOUS | Status: DC
  Administered 2015-11-21 – 2015-11-24 (×7): 10 mL
  Administered 2015-11-24: 40 mL
  Administered 2015-11-25: 10 mL
  Administered 2015-11-26: 30 mL

## 2015-11-20 MED ORDER — AMIODARONE HCL IN DEXTROSE 360-4.14 MG/200ML-% IV SOLN
60.0000 mg/h | INTRAVENOUS | Status: AC
  Administered 2015-11-20 (×2): 60 mg/h via INTRAVENOUS
  Filled 2015-11-20: qty 200

## 2015-11-20 MED ORDER — FENTANYL CITRATE (PF) 100 MCG/2ML IJ SOLN
INTRAMUSCULAR | Status: DC
Start: 2015-11-20 — End: 2015-11-20
  Filled 2015-11-20: qty 2

## 2015-11-20 MED ORDER — FENTANYL CITRATE (PF) 100 MCG/2ML IJ SOLN
50.0000 ug | INTRAMUSCULAR | Status: AC | PRN
  Administered 2015-11-20 – 2015-11-21 (×3): 50 ug via INTRAVENOUS
  Filled 2015-11-20 (×3): qty 2

## 2015-11-20 MED ORDER — ANTISEPTIC ORAL RINSE SOLUTION (CORINZ)
7.0000 mL | Freq: Four times a day (QID) | OROMUCOSAL | Status: DC
  Administered 2015-11-20 (×2): 7 mL via OROMUCOSAL

## 2015-11-20 MED ORDER — ATROPINE SULFATE 0.1 MG/ML IJ SOLN
INTRAMUSCULAR | Status: AC
  Filled 2015-11-20: qty 10

## 2015-11-20 MED ORDER — NOREPINEPHRINE BITARTRATE 1 MG/ML IV SOLN
0.0000 ug/min | INTRAVENOUS | Status: DC
  Filled 2015-11-20: qty 4

## 2015-11-20 MED ORDER — ANTISEPTIC ORAL RINSE SOLUTION (CORINZ): 7.0000 mL | Freq: Four times a day (QID) | OROMUCOSAL | Status: DC

## 2015-11-20 MED ORDER — AMIODARONE HCL IN DEXTROSE 360-4.14 MG/200ML-% IV SOLN
INTRAVENOUS | Status: AC
  Administered 2015-11-20: 60 mg/h via INTRAVENOUS
  Filled 2015-11-20: qty 200

## 2015-11-20 MED ORDER — FUROSEMIDE 10 MG/ML IJ SOLN
60.0000 mg | Freq: Once | INTRAMUSCULAR | Status: AC
  Administered 2015-11-20: 60 mg via INTRAVENOUS
  Filled 2015-11-20: qty 6

## 2015-11-20 MED ORDER — FUROSEMIDE 10 MG/ML IJ SOLN
INTRAMUSCULAR | Status: AC
  Filled 2015-11-20: qty 4

## 2015-11-20 MED ORDER — AMIODARONE IV BOLUS ONLY 150 MG/100ML
150.0000 mg | Freq: Once | INTRAVENOUS | Status: AC
  Administered 2015-11-20: 150 mg via INTRAVENOUS

## 2015-11-20 MED ORDER — AMIODARONE HCL IN DEXTROSE 360-4.14 MG/200ML-% IV SOLN
60.0000 mg/h | INTRAVENOUS | Status: AC
Start: 2015-11-20 — End: 2015-11-21
  Administered 2015-11-21 (×2): 60 mg/h via INTRAVENOUS
  Filled 2015-11-20 (×2): qty 200

## 2015-11-20 MED ORDER — CHLORHEXIDINE GLUCONATE 0.12% ORAL RINSE (MEDLINE KIT)
15.0000 mL | Freq: Two times a day (BID) | OROMUCOSAL | Status: DC
  Administered 2015-11-21 – 2015-11-23 (×5): 15 mL via OROMUCOSAL

## 2015-11-20 MED ORDER — LEVALBUTEROL HCL 1.25 MG/0.5ML IN NEBU
1.2500 mg | INHALATION_SOLUTION | Freq: Four times a day (QID) | RESPIRATORY_TRACT | Status: DC | PRN
  Administered 2015-11-20 – 2015-11-22 (×2): 1.25 mg via RESPIRATORY_TRACT
  Filled 2015-11-20 (×2): qty 0.5

## 2015-11-20 MED ORDER — FENTANYL CITRATE (PF) 100 MCG/2ML IJ SOLN
50.0000 ug | INTRAMUSCULAR | Status: DC | PRN
  Administered 2015-11-21 (×4): 50 ug via INTRAVENOUS
  Filled 2015-11-20 (×4): qty 2

## 2015-11-20 MED ORDER — DOPAMINE-DEXTROSE 3.2-5 MG/ML-% IV SOLN
0.0000 ug/kg/min | INTRAVENOUS | Status: DC
  Administered 2015-11-20: 5 ug/kg/min via INTRAVENOUS

## 2015-11-20 MED ORDER — ANTISEPTIC ORAL RINSE SOLUTION (CORINZ)
7.0000 mL | OROMUCOSAL | Status: DC
  Administered 2015-11-21 – 2015-11-23 (×25): 7 mL via OROMUCOSAL

## 2015-11-20 MED ORDER — DEXTROSE 5 % IV SOLN
1.0000 g | INTRAVENOUS | Status: DC
  Administered 2015-11-20 – 2015-11-21 (×2): 1 g via INTRAVENOUS
  Filled 2015-11-20 (×4): qty 1

## 2015-11-20 MED ORDER — ALPRAZOLAM 0.25 MG PO TABS
0.2500 mg | ORAL_TABLET | Freq: Once | ORAL | Status: AC | PRN
  Administered 2015-11-20: 0.25 mg via ORAL
  Filled 2015-11-20: qty 1

## 2015-11-20 MED ORDER — CHLORHEXIDINE GLUCONATE 0.12% ORAL RINSE (MEDLINE KIT)
15.0000 mL | Freq: Two times a day (BID) | OROMUCOSAL | Status: DC
  Administered 2015-11-20: 15 mL via OROMUCOSAL

## 2015-11-20 MED ORDER — MIDAZOLAM HCL 2 MG/2ML IJ SOLN
INTRAMUSCULAR | Status: AC
  Filled 2015-11-20: qty 2

## 2015-11-20 MED ORDER — ASPIRIN EC 81 MG PO TBEC
81.0000 mg | DELAYED_RELEASE_TABLET | Freq: Every day | ORAL | Status: DC
  Administered 2015-11-20: 81 mg via ORAL

## 2015-11-20 MED ORDER — FUROSEMIDE 10 MG/ML IJ SOLN
INTRAMUSCULAR | Status: AC
  Filled 2015-11-20: qty 2

## 2015-11-20 MED ORDER — AMIODARONE HCL IN DEXTROSE 360-4.14 MG/200ML-% IV SOLN: 30.0000 mg/h | INTRAVENOUS | Status: DC

## 2015-11-20 MED ORDER — INSULIN ASPART 100 UNIT/ML ~~LOC~~ SOLN
0.0000 [IU] | SUBCUTANEOUS | Status: DC
Start: 2015-11-20 — End: 2015-11-24
  Administered 2015-11-20 (×2): 11 [IU] via SUBCUTANEOUS
  Administered 2015-11-20: 7 [IU] via SUBCUTANEOUS
  Administered 2015-11-20: 15 [IU] via SUBCUTANEOUS
  Administered 2015-11-20: 7 [IU] via SUBCUTANEOUS
  Administered 2015-11-21 (×3): 4 [IU] via SUBCUTANEOUS
  Administered 2015-11-21: 7 [IU] via SUBCUTANEOUS
  Administered 2015-11-21: 4 [IU] via SUBCUTANEOUS
  Administered 2015-11-21: 7 [IU] via SUBCUTANEOUS
  Administered 2015-11-21: 4 [IU] via SUBCUTANEOUS
  Administered 2015-11-22: 11 [IU] via SUBCUTANEOUS
  Administered 2015-11-22: 7 [IU] via SUBCUTANEOUS
  Administered 2015-11-22: 4 [IU] via SUBCUTANEOUS
  Administered 2015-11-22: 11 [IU] via SUBCUTANEOUS
  Administered 2015-11-22: 7 [IU] via SUBCUTANEOUS
  Administered 2015-11-22: 11 [IU] via SUBCUTANEOUS
  Administered 2015-11-23 (×3): 3 [IU] via SUBCUTANEOUS
  Administered 2015-11-23: 7 [IU] via SUBCUTANEOUS
  Administered 2015-11-23: 3 [IU] via SUBCUTANEOUS
  Administered 2015-11-24 (×2): 7 [IU] via SUBCUTANEOUS
  Administered 2015-11-24: 3 [IU] via SUBCUTANEOUS
  Administered 2015-11-24: 7 [IU] via SUBCUTANEOUS
  Administered 2015-11-24: 4 [IU] via SUBCUTANEOUS

## 2015-11-20 MED ORDER — SODIUM BICARBONATE 8.4 % IV SOLN
INTRAVENOUS | Status: DC
  Administered 2015-11-20 – 2015-11-22 (×3): via INTRAVENOUS
  Filled 2015-11-20 (×4): qty 150

## 2015-11-20 MED ORDER — SODIUM CHLORIDE 0.9% FLUSH
10.0000 mL | INTRAVENOUS | Status: DC | PRN
  Administered 2015-11-26 – 2015-11-27 (×2): 30 mL
  Filled 2015-11-20 (×2): qty 40

## 2015-11-20 MED ORDER — ASPIRIN 81 MG PO CHEW
81.0000 mg | CHEWABLE_TABLET | Freq: Every day | ORAL | Status: DC
  Administered 2015-11-21 – 2015-11-27 (×7): 81 mg via ORAL
  Filled 2015-11-20 (×8): qty 1

## 2015-11-20 MED ORDER — ATORVASTATIN CALCIUM 80 MG PO TABS
80.0000 mg | ORAL_TABLET | Freq: Every day | ORAL | Status: DC
  Administered 2015-11-20 – 2015-11-26 (×6): 80 mg via ORAL
  Filled 2015-11-20 (×6): qty 1

## 2015-11-20 MED ORDER — PANTOPRAZOLE SODIUM 40 MG IV SOLR
40.0000 mg | INTRAVENOUS | Status: DC
  Administered 2015-11-20 – 2015-11-26 (×7): 40 mg via INTRAVENOUS
  Filled 2015-11-20 (×7): qty 40

## 2015-11-20 MED ORDER — MIDAZOLAM HCL 2 MG/2ML IJ SOLN
1.0000 mg | INTRAMUSCULAR | Status: DC | PRN
  Filled 2015-11-20 (×3): qty 2

## 2015-11-20 MED ORDER — MIDAZOLAM HCL 2 MG/2ML IJ SOLN
1.0000 mg | INTRAMUSCULAR | Status: DC | PRN
  Administered 2015-11-21 (×3): 1 mg via INTRAVENOUS

## 2015-11-20 MED ORDER — FUROSEMIDE 10 MG/ML IJ SOLN
60.0000 mg | Freq: Two times a day (BID) | INTRAMUSCULAR | Status: DC
  Administered 2015-11-20: 60 mg via INTRAVENOUS

## 2015-11-20 MED ORDER — METOPROLOL TARTRATE 50 MG PO TABS: 50.0000 mg | ORAL_TABLET | Freq: Two times a day (BID) | ORAL | Status: DC

## 2015-11-20 MED ORDER — OFF THE BEAT BOOK
Freq: Once | Status: DC
  Filled 2015-11-20: qty 1

## 2015-11-20 MED FILL — Medication: Qty: 1 | Status: AC

## 2015-11-20 NOTE — Procedures (Signed)
Intubation Procedure Note [name] [id number] 29-Oct-1940  Procedure: Intubation Indications: Respiratory insufficiency  Procedure Details Consent: Unable to obtain consent because of emergent medical necessity. Time Out: Verified patient identification, verified procedure, site/side was marked, verified correct patient position, special equipment/implants available, medications/allergies/relevent history reviewed, required imaging and test results available.  Performed  Maximum sterile technique was used including gloves, hand hygiene and mask.  MAC    Evaluation Hemodynamic Status: BP stable throughout; O2 sats: stable throughout Patient's Current Condition: stable Complications: No apparent complications Patient did tolerate procedure well. Chest X-ray ordered to verify placement.  CXR: tube position acceptable.   [name] 11/20/2015

## 2015-11-20 NOTE — Procedures (Signed)
CPR  Code called for sinus bradycardia and respiratory arrest.  Patient was bagged aggressively and HR improved.  Please see code sheet for details.  Rush Farmer, M.D. [facility] Pulmonary/Critical Care Medicine. Pager: [phone]. After hours pager: [phone].

## 2015-11-20 NOTE — Progress Notes (Signed)
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 Presented to the ED secondary to altered mental status. Pt was found to have UTI as outpatient and reportedly was non compliant with antibiotics.  Currently on IV antibiotics and was subsequently found to have new onset Afib which cardiology is currently following.  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 Also received Lasix 60 mg IV at 9 am. - Respiratory condition worsened and patient required intubation which critical care team assisted with. Would like to thank the critical care team for their help. - Pt transferred to ICU     Atrial fibrillation with RVR ([place]) - Consulted cardiology with assistance for further management - On heparin gtt - due to bradycardia B blocker and Cardizem gtt discontinued today.  Sepsis - continue antibiotic regimen - will f/u with blood cultures. - 1 of 2 blood cultures positive for gram positive bacteria in clusters, on vancomycin.  UTI - Currently on Rocephin and vancomycin  Type 2 diabetes mellitus with vascular disease ([place]) - Continue Lantus and SSI  Active Problems:   History of bladder cancer   HTN (hypertension)   CAD (coronary artery disease)   CKD (chronic kidney disease) stage 3, GFR 30-59 ml/min   Dehydration   Status post above knee amputation of right lower extremity (HCC)   Anemia     AKI (acute kidney injury) ([place]) - Improving currently. Most likely prerenal etiology.    Metabolic encephalopathy - Improving    Acute  hyperkalemia - Resolved   Code Status: Full Family Communication: Will reach out to family for update. Disposition Plan: step down   Consultants:  Cardiology  Procedures:  None  Antibiotics:  Rocephin and vancomycin  HPI/Subjective: Patient was lethargic this AM. Rapid response called;  Objective: Filed Vitals:   11/20/15 0600 11/20/15 0700  BP: 121/105 126/84  Pulse: 151 112  Temp:    Resp: 20 25    Intake/Output Summary (Last 24 hours) at 11/20/15 1002 Last data filed at 11/20/15 0402  Gross per 24 hour  Intake   7025 ml  Output   1600 ml  Net   5425 ml   Filed Weights   11/19/15 0439 11/20/15 0448  Weight: 98.3 kg (216 lb 11.4 oz) 97.6 kg (215 lb 2.7 oz)    Exam:   General:  Patient lethargic, difficulty breathing  Cardiovascular: no cyanosis  Respiratory: during evaluation patient was receiving assistance with ambu bag  Abdomen: Soft, nondistended, obese  Data Reviewed: Basic Metabolic Panel:  Recent Labs Lab 11/18/15 0409 11/18/15 0841 11/18/15 1614 11/19/15 0445 11/19/15 0519 11/20/15 0130  NA 125* 133* 131* 132*  --  133*  K 6.9* 5.7* 5.2* 4.9  --  5.2*  CL 97* 107 105 106  --  108  CO2 16* 16* 16* 15*  --  16*  GLUCOSE 532* 312* 286* 260*  --  239*  BUN 71* 64* 63* 56*  --  49*  CREATININE 2.96* 2.63* 2.37* 1.99*  --  1.47*  CALCIUM 9.0 8.4* 8.9  8.8* 8.6*  --  9.4  MG  --   --  1.4*  --  1.7  --   PHOS  --   --  3.7  --   --   --    Liver Function Tests:  Recent Labs Lab 11/14/15 2035 11/19/15 0445  AST 19 37  ALT 27 54  ALKPHOS 142* 146*  BILITOT 0.7 0.5  PROT 7.0 5.6*  ALBUMIN 3.3* 1.9*    Recent Labs Lab 11/14/15 2035  LIPASE 30    Recent Labs Lab 11/18/15 0841  AMMONIA 18   CBC:  Recent Labs Lab 11/14/15 2035 11/15/15 1231 11/15/15 1439 11/18/15 0409 11/19/15 0445 11/20/15 0445  WBC 7.6 12.4*  --  7.8 8.0 9.1  NEUTROABS  --  10.7*  --  5.9  --   --   HGB 12.6 12.0 11.2* 10.6* 9.7* 10.6*   HCT 38.8 37.8 33.0* 34.2* 30.8* 34.1*  MCV 82.9 82.5  --  83.4 82.8 83.6  PLT 208 189  --  185 219 293   Cardiac Enzymes:  Recent Labs Lab 11/18/15 1614 11/18/15 1942  TROPONINI 0.08* 0.07*   BNP (last 3 results) No results for input(s): BNP in the last 8760 hours.  ProBNP (last 3 results) No results for input(s): PROBNP in the last 8760 hours.  CBG:  Recent Labs Lab 11/19/15 1651 11/19/15 2019 11/19/15 2349 11/20/15 0415 11/20/15 0722  GLUCAP 218* 215* 190* 233* 241*    Recent Results (from the past 240 hour(s))  Urine culture     Status: None   Collection Time: 11/15/15  1:35 PM  Result Value Ref Range Status   Specimen Description URINE, CATHETERIZED  Final   Special Requests NONE  Final   Culture MULTIPLE SPECIES PRESENT, SUGGEST RECOLLECTION  Final   Report Status 11/16/2015 FINAL  Final  Culture, blood (Routine X 2) w Reflex to ID Panel     Status: None (Preliminary result)   Collection Time: 11/18/15  3:23 AM  Result Value Ref Range Status   Specimen Description BLOOD RIGHT WRIST  Final   Special Requests   Final    BOTTLES DRAWN AEROBIC AND ANAEROBIC 5CC AER [place] ANA   Culture NO GROWTH 1 DAY  Final   Report Status PENDING  Incomplete  Culture, blood (Routine X 2) w Reflex to ID Panel     Status: None (Preliminary result)   Collection Time: 11/18/15  3:25 AM  Result Value Ref Range Status   Specimen Description BLOOD LEFT WRIST  Final   Special Requests IN PEDIATRIC BOTTLE [place]  Final   Culture  Setup Time   Final    GRAM POSITIVE COCCI IN CLUSTERS IN PEDIATRIC BOTTLE CRITICAL RESULT CALLED TO, READ BACK BY AND VERIFIED WITH: Nicholes Calamity, [place] [facility]    Culture GRAM POSITIVE COCCI  Final   Report Status PENDING  Incomplete  Urine culture     Status: None   Collection Time: 11/18/15  4:58 AM  Result Value Ref Range Status   Specimen Description URINE, RANDOM  Final   Special Requests BAG(PED)  Final   Culture MULTIPLE SPECIES PRESENT,  SUGGEST RECOLLECTION  Final   Report Status 11/19/2015 FINAL  Final  MRSA PCR Screening     Status: None   Collection Time: 11/18/15  2:42 PM  Result Value Ref Range Status   MRSA by PCR NEGATIVE NEGATIVE Final    Comment:        The GeneXpert MRSA Assay (FDA approved for NASAL  specimens only), is one component of a comprehensive MRSA colonization surveillance program. It is not intended to diagnose MRSA infection nor to guide or monitor treatment for MRSA infections.      Studies: Dg Chest 1 View  11/20/2015  CLINICAL DATA:  CHF.  New onset shortness of Breath EXAM: CHEST 1 VIEW COMPARISON:  11/19/2015 FINDINGS: Cardiomegaly. Bilateral perihilar and lower lobe opacities with probable layering effusions, similar to prior study, most compatible with CHF. No change since prior study. IMPRESSION: Stable CHF pattern.  Probable layering effusions. Electronically Signed   By: Rolm Baptise M.D.   On: 11/20/2015 09:37   Dg Chest Port 1 View  11/19/2015  CLINICAL DATA:  Dyspnea EXAM: PORTABLE CHEST 1 VIEW COMPARISON:  None FINDINGS: There is moderate cardiomegaly. There is vascular and interstitial prominence. There are pleural effusions. There is perihilar ground-glass opacity. IMPRESSION: Congestive heart failure. Electronically Signed   By: Andreas Newport M.D.   On: 11/19/2015 23:39    Scheduled Meds: . aspirin EC  81 mg Oral Daily  . atorvastatin  80 mg Oral q1800  . atropine      . cefTRIAXone (ROCEPHIN)  IV  1 g Intravenous Q24H  . fentaNYL      . furosemide      . furosemide      . furosemide  60 mg Intravenous BID  . insulin aspart  0-20 Units Subcutaneous Q4H  . insulin glargine  60 Units Subcutaneous BID  . midazolam      . norepinephrine (LEVOPHED) Adult infusion  0-40 mcg/min Intravenous STAT  . off the beat book   Does not apply Once  . sodium chloride flush  3 mL Intravenous Q12H  . vancomycin  1,000 mg Intravenous Q24H   Continuous Infusions: . sodium chloride 10  mL/hr at 11/20/15 0120  . heparin 1,300 Units/hr (11/19/15 2344)   Time spent: > 35 minutes  Velvet Bathe  [facility] Pager [phone]. If 7PM-7AM, please contact night-coverage at [email or web address], password [facility] 11/20/2015, 10:02 AM  LOS: 2 days

## 2015-11-20 NOTE — Procedures (Signed)
Central Venous Catheter Insertion Procedure Note ZILLIE LEGE [id number] 01-07-1941  Procedure: Insertion of Central Venous Catheter Indications: Assessment of intravascular volume and Drug and/or fluid administration  Procedure Details Consent: Unable to obtain consent because of emergent medical necessity. Time Out: Verified patient identification, verified procedure, site/side was marked, verified correct patient position, special equipment/implants available, medications/allergies/relevent history reviewed, required imaging and test results available.  Performed  Maximum sterile technique was used including antiseptics, cap, gloves, gown, hand hygiene, mask and sheet. Skin prep: Chlorhexidine; local anesthetic administered A antimicrobial bonded/coated triple lumen catheter was placed in the left internal jugular vein using the Seldinger technique.  Evaluation Blood flow good Complications: No apparent complications Patient did tolerate procedure well. Chest X-ray ordered to verify placement.  CXR: WNL.  Pamila Mendibles 11/20/2015, 11:51 AM

## 2015-11-20 NOTE — Progress Notes (Signed)
[place] for Heparin Indication: atrial fibrillation  Allergies  Allergen Reactions  . Gabapentin Other (See Comments)    Extremely drunk and lethargic  . Lisinopril Other (See Comments)    Increased creatinine levels (pt currently taking low dose)  . Metformin And Related Other (See Comments)    Increased creatinine levels  . Biaxin [Clarithromycin] Itching    Patient Measurements: Height/Weight from 11/14/2015 Ht. 5'2" Wt. 89.8kg Heparin Dosing Weight: 70.8kg  Vital Signs: Temp: 98.6 F (37 C) (04/10 1200) Temp Source: Oral (04/10 1200) BP: 122/51 mmHg (04/10 1200) Pulse Rate: 81 (04/10 1100)  Labs:  Recent Labs  11/18/15 1614 11/18/15 1942 11/19/15 0445 11/20/15 0130 11/20/15 0445 11/20/15 1045  HGB  --   --  9.7*  --  10.6* 10.0*  HCT  --   --  30.8*  --  34.1* 32.7*  PLT  --   --  219  --  293 276  HEPARINUNFRC 0.50  --  0.53  --  0.57  --   CREATININE 2.37*  --  1.99* 1.47*  --  1.75*  TROPONINI 0.08* 0.07*  --   --   --  <0.03   Estimated Creatinine Clearance: 30.8 mL/min (by C-G formula based on Cr of 1.75).  Medical History: Past Medical History  Diagnosis Date  . Hypertension   . Hyperlipidemia   . History of atrial tachycardia   . Peripheral vascular disease (HCC) S/P RIGHT BKA  . Peripheral neuropathy (HCC) LEGS AND HANDS  . 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 GERD (gastroesophageal reflux disease)   . Anemia   . Blood transfusion   . Macular degeneration of both eyes   . Glaucoma   . Retinopathy due to secondary diabetes mellitus ([place])   . History of bladder cancer TCC AND CIS  . Myocardial infarction ([place])   . Presence of urostomy ([place])   . Type 2 diabetes mellitus with vascular disease ([place])   . CKD (chronic kidney disease) stage 3, GFR 30-59 ml/min 01/26/2013  . Obesity (BMI 30-39.9)   . CAD (coronary artery disease) 01/26/2013    Evidently circumflex stent in 2014 following  bladder cancer surgery for old myocardial infarction.    Assessment: This patient is known to pharmacy from past heparin therapy (2014).  She presents with atrial fibrillation and we have been asked to initiate IV heparin for her.  She required higher than calculated dose using adjusted body weight in the past.    Heparin level = 0.5 at goal. No bleeding issues noted. CBC stable  Goal of Therapy:  Heparin level 0.3-0.7 units/ml Monitor platelets by anticoagulation protocol: Yes   Plan:  Continue Heparin infusion at 1300 units / hr Daily Heparin level, CBC  Erin Hearing PharmD., BCPS Clinical Pharmacist Pager [phone] 11/20/2015 12:43 PM

## 2015-11-20 NOTE — Progress Notes (Addendum)
SUBJECTIVE:  Very Lethargic and working hard with breathing.  Very weak  OBJECTIVE:   Vitals:   Filed Vitals:   11/20/15 0448 11/20/15 0500 11/20/15 0600 11/20/15 0700  BP:  104/57 121/105 126/84  Pulse:  100 151 112  Temp:      TempSrc:      Resp:  15 20 25   Weight: 215 lb 2.7 oz (97.6 kg)     SpO2:  92% 94% 91%   I&O's:   Intake/Output Summary (Last 24 hours) at 11/20/15 [id number] Last data filed at 11/20/15 0402  Gross per 24 hour  Intake   7025 ml  Output   1600 ml  Net   5425 ml   TELEMETRY: Reviewed telemetry pt in junctional bradycardia     PHYSICAL EXAM General: very lethargic Head: Eyes PERRLA, No xanthomas.   Normal cephalic and atramatic  Lungs:   Decreased BS Heart:   Regular rhythm and bradycardic  S1 S2 Pulses are 2+ & equal. Abdomen: Bowel sounds are positive, abdomen soft and non-tender without masses Extremities:   No clubbing, cyanosis or edema.  DP +1   LABS: Basic Metabolic Panel:  Recent Labs  11/18/15 1614 11/19/15 0445 11/19/15 0519 11/20/15 0130  NA 131* 132*  --  133*  K 5.2* 4.9  --  5.2*  CL 105 106  --  108  CO2 16* 15*  --  16*  GLUCOSE 286* 260*  --  239*  BUN 63* 56*  --  49*  CREATININE 2.37* 1.99*  --  1.47*  CALCIUM 8.9  8.8* 8.6*  --  9.4  MG 1.4*  --  1.7  --   PHOS 3.7  --   --   --    Liver Function Tests:  Recent Labs  11/19/15 0445  AST 37  ALT 54  ALKPHOS 146*  BILITOT 0.5  PROT 5.6*  ALBUMIN 1.9*   No results for input(s): LIPASE, AMYLASE in the last 72 hours. CBC:  Recent Labs  11/18/15 0409 11/19/15 0445 11/20/15 0445  WBC 7.8 8.0 9.1  NEUTROABS 5.9  --   --   HGB 10.6* 9.7* 10.6*  HCT 34.2* 30.8* 34.1*  MCV 83.4 82.8 83.6  PLT 185 219 293   Cardiac Enzymes:  Recent Labs  11/18/15 1614 11/18/15 1942  TROPONINI 0.08* 0.07*   BNP: Invalid input(s): POCBNP D-Dimer: No results for input(s): DDIMER in the last 72 hours. Hemoglobin A1C: No results for input(s): HGBA1C in the last 72  hours. Fasting Lipid Panel: No results for input(s): CHOL, HDL, LDLCALC, TRIG, CHOLHDL, LDLDIRECT in the last 72 hours. Thyroid Function Tests:  Recent Labs  11/18/15 1614  TSH 0.735   Anemia Panel: No results for input(s): VITAMINB12, FOLATE, FERRITIN, TIBC, IRON, RETICCTPCT in the last 72 hours. Coag Panel:   Lab Results  Component Value Date   INR 1.04 09/12/2014   INR 1.03 01/26/2013   INR 0.98 06/10/2011    RADIOLOGY: Dg Chest 2 View  11/14/2015  CLINICAL DATA:  Chest pain and shortness of breath for 2 days. Initial encounter. EXAM: CHEST  2 VIEW COMPARISON:  PA and lateral chest 10/05/2015 and 11/14/2012. FINDINGS: The lungs are clear. Heart size is normal. No pneumothorax or pleural effusion. Remote left rib fractures are noted. IMPRESSION: No acute disease. Electronically Signed   By: Inge Rise M.D.   On: 11/14/2015 21:20   Ct Chest Wo Contrast  11/15/2015  CLINICAL DATA:  Chest pain onset today. EXAM:  CT CHEST WITHOUT CONTRAST TECHNIQUE: Multidetector CT imaging of the chest was performed following the standard protocol without IV contrast. COMPARISON:  Radiograph 3 hours prior. Included lung bases from CT abdomen/pelvis 10/05/2015 and 06/08/2013. FINDINGS: Mediastinum/Lymph Nodes: Atherosclerotic calcifications of normal caliber thoracic aorta. Coronary artery calcifications versus coronary stent. Heart is normal in size. No pericardial effusion. No mediastinal or evidence of hilar adenopathy on noncontrast exam. There is a 3.5 cm hypodense nodule in the lower right thyroid gland extending to the isthmus. Lungs/Pleura: No consolidation. No pulmonary edema. Trachea and mainstem bronchi are patent. High-density tree and bud opacities in the right lower lobe, unchanged from prior exams dating back to 2014. Tiny subpleural nodules in the left lower lobe also unchanged dating back to 2014. Upper abdomen: No acute abnormality. Thinning of the included renal parenchyma. Pancreatic  atrophy noted. Stomach distended with ingested contents. Musculoskeletal: Multilevel degenerative change throughout spine. Diffuse bony under mineralization. Remote left rib fractures. There are no acute or suspicious osseous abnormalities. IMPRESSION: 1. No acute intrathoracic process. 2. Aortic atherosclerosis, coronary artery calcifications versus stents. 3. Thyroid nodule on the right/isthmus measures 3.5 cm. Recommend nonemergent thyroid ultrasound. 4. Chronic right lung base opacities, unchanged dating back to 2014 and considered benign. This may be secondary to aspirated barium. Electronically Signed   By: Jeb Levering M.D.   On: 11/15/2015 00:36   Mr Cervical Spine Wo Contrast  11/15/2015  CLINICAL DATA:  Severe neck pain, acute on chronic. EXAM: MRI CERVICAL SPINE WITHOUT CONTRAST TECHNIQUE: Multiplanar, multisequence MR imaging of the cervical spine was performed. No intravenous contrast was administered. COMPARISON:  05/24/2010 FINDINGS: The study is motion degraded. Axial sequences are severely motion degraded and largely nondiagnostic with regards to assessment of the spinal canal and neural foramina. Vertebral alignment is unchanged. Vertebral body heights are preserved. No gross vertebral marrow edema is identified. No signal abnormality is identified in the upper cervical cord. Evaluation of the mid to lower cervical cord is limited by motion artifact. Multiple thyroid nodules are partially visualized including up in approximately 4 cm nodule involving the medial right lobe/isthmus as described on the chest CT earlier today. There is mild disc space narrowing at C5-6. There is moderate narrowing at C6-7 which has progressed from the prior study. Moderate C7-T1 disc space narrowing also may have mildly progressed. Based on the sagittal images, there is suspected moderate spinal stenosis at C2-3, C3-4, C5-6, and C6-7 due to disc bulging, endplate spurring, and infolding of the ligamentum flavum.  Multilevel facet arthrosis is present. Multilevel neural foraminal narrowing cannot be adequately assessed. IMPRESSION: Severely motion degraded examination as above. 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 Electronically Signed   By: Logan Bores M.D.   On: 11/15/2015 19:33   Dg Chest Port 1 View  11/19/2015  CLINICAL DATA:  Dyspnea EXAM: PORTABLE CHEST 1 VIEW COMPARISON:  None FINDINGS: There is moderate cardiomegaly. There is vascular and interstitial prominence. There are pleural effusions. There is perihilar ground-glass opacity. IMPRESSION: Congestive heart failure. Electronically Signed   By: Andreas Newport M.D.   On: 11/19/2015 23:39   Dg Chest Port 1 View  11/18/2015  CLINICAL DATA:  Hypotension.  Tachycardia. EXAM: PORTABLE CHEST 1 VIEW COMPARISON:  11/14/2015 FINDINGS: There is unchanged borderline cardiomegaly. The lungs are clear. Is no large effusion. Pulmonary vasculature is normal. No interval change is evident. IMPRESSION: No active disease. Electronically Signed   By: Andreas Newport M.D.   On: 11/18/2015  04:35      ASSESSMENT/PLAN:   75 year old female has a history of bladder cancer and has a urostomy bag. She has a history of severe diabetes with nephropathy and neuropathy and previous amputations. She had an MI after her bladder cancer and underwent PCI of the LCx.  She was in the emergency room a few days ago with some atypical chest pain and then had neck pain and had significant hyperglycemia. She was treated with Keflex for UTI as an outpatient and then returned to the emergency room yesterday with altered mentation. She had been noncompliant with medicines for UTI and was hallucinating confused when she was brought in yesterday with hypotension. Her blood pressure improved with the fluid bolus and she was noted to be in new onset of atrial fibrillation with rapid response. She was in acute renal failure when she  came in which has improved with hydration.  There were no ischemic changes on her EKG that showed atrial fibrillation with rapid response. There is a history of atrial tachycardia in the past but no prior history of atrial fibrillation in the past.  1. Paroxysmal atrial fibrillation of unknown duration in the setting of infection - appears to have converted to junction bradycardia.   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 - 2D echo pending.  Chest xray showed CM.   - continue IV Heparin gtt  - Stop Cardizem gtt and metoprolol 2. ASCAD with previous circumflex stent - she had some atypical CP apparently a few days ago and was sent home from ER.  She complained of bilateral arm pain this admission. Restart ASA and statin.  Hold long acting nitrate due to soft BP.  3.  Acute CHF noted on chest xray with increasing oxygen requirements.  Currently on 50% Venturi mask.  Check ABG.  She is 10L+ since admission.  Given Lasix last night. Will start Lasix 40mg  IV BID.  Check BNP.  Repeat Chest xray this am. 4. Hypertension - BP soft.  Stopping BB as above 5. Hyperlipidemia - restart statin 6. History of bladder cancer with urostomy 7. Anemia 8. Acute on Chronic kidney disease reportedly stage III  - creatinine improving 9.  Diabetes mellitus with peripheral vascular disease, peripheral neuropathy and previous amputation 10.  Hortencia Conradi - she is very lethargic today ? Etiology.  She is afebrile and normal WBC.  With conversion to junctional rhythm need to consider acute stroke.  Will get head CT.  Could also be due to hypoxemia.  Check ABG. 11.  Sepsis with UTI per [facility]    Sueanne Margarita, MD  11/20/2015  8:22 AM

## 2015-11-20 NOTE — Progress Notes (Addendum)
11/20/2015 Patient during bedside report was very lathergic at 0718. She was on a venti mask at 12 Liter saturation was in the 90's. Continue to monitor patient at 0751. Patient saturation was 85 to 88 she was placed on a non breather and eventually saturation increase to 94%. Patient heart rate started going in the 40's to 50 Cardizem was turned off. Cardiology was in patient room and she was made aware. Orders were placed by cardiology  for 12 lead EKG, Lasix 60mg  IV and portable chest x-ray. Dr Wendee Beavers was made aware concerning patient condition, also Rapid response was notified to come assess patient. [facility] RN.

## 2015-11-20 NOTE — Progress Notes (Addendum)
Cardiac Monitoring Event  Dysrhythmia:  Atrial fibrillation  Symptoms:  Asymptomatic and Other (Comment)  Level of Consciousness:  Alert or Arousable  Last set of vital signs taken:  Temp: 99.9 F (37.7 C)  Pulse Rate: 83  Resp: (!) 30  BP: (!) 123/54 mmHg  SpO2: 100 %  Name of MD Notified:  Dr. Oletta Darter  Time MD Notified:  2130  Comments/Actions Taken:  150mg  amiodarone bolus; BMP and Mg   Amiodarone continued at 60mg /hr per MD. Re-evaluate in the AM. Pharmacy notified.

## 2015-11-20 NOTE — Progress Notes (Signed)
eLink Physician-Brief Progress Note Patient Name: [name] DOB: 1941/07/02 MRN: [id number]   Date of Service  11/20/2015  HPI/Events of Note  Patient ventilated. Notified of need for stress ulcer prophylaxis.   eICU Interventions  Will order Protonix IV.      Intervention Category Intermediate Interventions: Best-practice therapies (e.g. DVT, beta blocker, etc.)  [name] 11/20/2015, 8:45 PM

## 2015-11-20 NOTE — Progress Notes (Addendum)
Pharmacy Antibiotic Note  [name] is a 75 y.o. female admitted on 11/18/2015 with hyperglycemia/hyperkalemia, now w/ GPC in clusters bacteremia (coag negative staph).  Pharmacy has been consulted to add cefepime to vancomycin dosing.  Patient with respiratory distress requiring intubation this morning. She was then transferred to ICU. No fevers overnight, wbc still wnl. Abx were broadened from ceftriaxone to cefepime post arrest.  Plan: Vancomycin 1000 IV every 24 hours.  Goal trough 15-20 mcg/mL.  Cefepime 1g q24 hours   Temp (24hrs), Avg:98.2 F (36.8 C), Min:97.8 F (36.6 C), Max:98.6 F (37 C)   Recent Labs Lab 11/15/15 1231  11/18/15 0409 11/18/15 0841 11/18/15 1614 11/19/15 0445 11/20/15 0130 11/20/15 0445 11/20/15 1045 11/20/15 1046  WBC 12.4*  --  7.8  --   --  8.0  --  9.1 10.5  --   CREATININE 1.52*  < > 2.96* 2.63* 2.37* 1.99* 1.47*  --  1.75*  --   LATICACIDVEN  --   < >  --  1.9 1.2  --  0.9 0.8  --  1.4  < > = values in this interval not displayed.  Estimated Creatinine Clearance: 30.8 mL/min (by C-G formula based on Cr of 1.75).    Allergies  Allergen Reactions  . Gabapentin Other (See Comments)    Extremely drunk and lethargic  . Lisinopril Other (See Comments)    Increased creatinine levels (pt currently taking low dose)  . Metformin And Related Other (See Comments)    Increased creatinine levels  . Biaxin [Clarithromycin] Itching    Antimicrobials this admission: Rocephin 4/8 >> 4/10 Vancomycin 4/9 >>  Cefepime 4/10>>  Microbiology results: 4/8 BCx: GPC in clusters>>coag neg staph 4/5 UCx: multiple spp  4/8 MRSA PCR: negative  Thank you for allowing pharmacy to be a part of this patient's care.  Erin Hearing PharmD., BCPS Clinical Pharmacist Pager [phone] 11/20/2015 12:42 PM

## 2015-11-20 NOTE — Consult Note (Signed)
ELECTROPHYSIOLOGY CONSULT NOTE    Patient ID: [name] MRN: [id number], DOB/AGE: 10/26/1940 75 y.o.  Admit date: 11/18/2015 Date of Consult: 11/20/2015  Primary Physician: Gerrit Heck, MD Primary Cardiologist: Dr. Marlou Porch  Reason for Consultation: tachy-brady  HPI: [name] is a 75 y.o. female with PMHx of CAD/MI in January of 2014 following bladder surgery at [facility] (secondary to Ca) with chronic urostomy tube, uncontrolled diabetes, hypertension, hyperlipidemia, peripheral vascular disease with right knee BKA, and left Fem-pop Feb 2016, CKD stage III.   She was admitted to [facility] 11/18/15 observed to be encephalopathic, confused with hallucinations,  with BS 532, Na+ 125, K+ 6.9, BUN/Creat 71/2.96 and an ongoing UTI, ER BOP hypotensive with SBP in 70's that responded to IVF.  On 11/19/15 pH 7.198, this morning pH 7.064.    The patient was observed to be in AFib on arrival with RVR, started on Cardizem gtt and heparin gtt felt likely secondary to infection, sepsis.  This morning she had deterioration of her respiratory status and is now intubated.  EP is called today to evaluate for concern of tachybrady syndrome noting bradycardic rates with pause of nearly 6 seconds, and junction rhythm 30's cardizem gtt turned off, and treated briefly with dopamine.  Her BP is improved, and now getting diuresed with evidence of fluid OL.  She has been afebrile while here.  BP now 122/51.  The patient at this time is intubated, awake.  Past Medical History  Diagnosis Date  . Hypertension   . Hyperlipidemia   . History of atrial tachycardia   . Peripheral vascular disease (HCC) S/P RIGHT BKA  . Peripheral neuropathy (HCC) LEGS AND HANDS  . 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 GERD (gastroesophageal reflux disease)   . Anemia   . Blood transfusion   . Macular degeneration of both eyes   . Glaucoma   . Retinopathy due to secondary diabetes mellitus ([place])   .  History of bladder cancer TCC AND CIS  . Myocardial infarction ([place])   . Presence of urostomy ([place])   . Type 2 diabetes mellitus with vascular disease ([place])   . CKD (chronic kidney disease) stage 3, GFR 30-59 ml/min 01/26/2013  . Obesity (BMI 30-39.9)   . CAD (coronary artery disease) 01/26/2013    Evidently circumflex stent in 2014 following bladder cancer surgery for old myocardial infarction.      Surgical History:  Past Surgical History  Procedure Laterality Date  . I & d right below knee amputation wound  06-10-2011  . Cysto / resection bladder bx's  04-01-11  &  11-26-10  . Below knee leg amputation  05-02-2007    RIGHT  . Right foot i & d / removal necrotic bone  JUN  &  AUG 2008  . Right great toe amputation  11-05-2006  . Laparoscopic cholecystectomy  12-10-1999  . Back surgery  [place]  . Appendectomy  1963  . Cataract extraction w/ intraocular lens  implant, bilateral    . Cystoscopy with biopsy  10/14/2011    Procedure: CYSTOSCOPY WITH BIOPSY;  Surgeon: Claybon Jabs, MD;  Location: [facility];  Service: Urology;  Laterality: N/A;  BLADDER BIOPSY  . Coronary stent placement    . Abdominal hysterectomy    . Revision urostomy cutaneous    . Revision urostomy cutaneous    . Abdominal angiogram N/A 09/08/2014    Procedure: ABDOMINAL ANGIOGRAM;  Surgeon: Aaron Edelman  Starlyn Skeans, MD;  Location: [facility] CATH LAB;  Service: Cardiovascular;  Laterality: N/A;  . Femoral-popliteal bypass graft Left 09/12/2014    Procedure:  LEFT FEMORAL-POPLITEAL ARTERY BYPASS GRAFT USING NON REVERSE LEFT GREATER SAPHENOUS VEIN;  Surgeon: Rosetta Posner, MD;  Location: [place];  Service: Vascular;  Laterality: Left;  . Amputation Left 09/14/2014    Procedure: LEFT FOOT FIRST RAY AMPUTATION;  Surgeon: Mcarthur Rossetti, MD;  Location: [place];  Service: Orthopedics;  Laterality: Left;     Prescriptions prior to admission  Medication Sig Dispense Refill Last Dose  . acetaminophen (TYLENOL) 500 MG  tablet Take 1,000 mg by mouth every 6 (six) hours as needed for mild pain.    11/17/2015 at Unknown time  . aspirin EC 81 MG tablet Take 81 mg by mouth every evening.   11/17/2015 at Unknown time  . atorvastatin (LIPITOR) 80 MG tablet TAKE 1 TABLET (80 MG TOTAL) BY MOUTH EVERY EVENING. 90 tablet 2 11/17/2015 at Unknown time  . cephALEXin (KEFLEX) 500 MG capsule Take 1 capsule (500 mg total) by mouth 2 (two) times daily. 14 capsule 0 11/18/2015 at Unknown time  . furosemide (LASIX) 40 MG tablet Take 80 mg by mouth 3 (three) times daily.    Past Week at Unknown time  . HYDROcodone-acetaminophen (NORCO/VICODIN) 5-325 MG tablet Take 1-2 tablets by mouth every 4 (four) hours as needed for moderate pain or severe pain. 20 tablet 0 11/17/2015 at Unknown time  . insulin glargine (LANTUS) 100 UNIT/ML injection Inject 55-60 Units into the skin 2 (two) times daily. 55 units in the morning and 60 units at night   11/17/2015 at Unknown time  . insulin lispro (HUMALOG) 100 UNIT/ML injection Inject 20-50 Units into the skin daily as needed for high blood sugar. PER SLIDING SCALE   11/17/2015 at Unknown time  . isosorbide mononitrate (IMDUR) 30 MG 24 hr tablet TAKE 1 TABLET (30 MG TOTAL) BY MOUTH DAILY. 30 tablet 7 11/17/2015 at Unknown time  . Magnesium 300 MG CAPS Take 300 mg by mouth daily.   11/17/2015 at Unknown time  . Melatonin 10 MG TABS Take 10 mg by mouth at bedtime.    11/17/2015 at Unknown time  . metoprolol succinate (TOPROL-XL) 100 MG 24 hr tablet TAKE 1 TABLET (100 MG TOTAL) BY MOUTH EVERY EVENING. TAKE WITH OR IMMEDIATELY FOLLOWING A MEAL. 90 tablet 2 11/17/2015 at 1800  . Multiple Vitamins-Minerals (PRESERVISION AREDS PO) Take 1 capsule by mouth 2 (two) times daily.   11/17/2015 at Unknown time  . nitroGLYCERIN (NITROSTAT) 0.6 MG SL tablet Take 0.6 mg by mouth every 5 (five) minutes x 3 doses as needed.   prn  . omeprazole (PRILOSEC) 20 MG capsule Take 20 mg by mouth every evening.    11/17/2015 at Unknown time  .  oxyCODONE-acetaminophen (PERCOCET) 5-325 MG tablet Take 1-2 tablets by mouth every 6 (six) hours as needed. (Patient taking differently: Take 1-2 tablets by mouth every 6 (six) hours as needed for severe pain. ) 20 tablet 0 11/17/2015 at Unknown time  . polyethylene glycol (MIRALAX / GLYCOLAX) packet Take 17 g by mouth daily. (Patient taking differently: Take 17 g by mouth daily as needed for moderate constipation. ) 14 each 0 Past Week at Unknown time  . potassium chloride SA (K-DUR,KLOR-CON) 20 MEQ tablet Take 20 mEq by mouth daily.    11/17/2015 at Unknown time  . Psyllium (VEGETABLE LAXATIVE PO) Take 4 tablets by mouth at bedtime.  11/17/2015 at Unknown time  . senna (SENOKOT) 8.6 MG TABS tablet Take 1 tablet by mouth See admin instructions. Takes 1 tab daily and can take addt'l tab as needed for constipation   11/17/2015 at Unknown time  . sodium bicarbonate 650 MG tablet Take 1,300 mg by mouth 2 (two) times daily.   11/17/2015 at Unknown time    Inpatient Medications:  . amiodarone      . antiseptic oral rinse  7 mL Mouth Rinse QID  . antiseptic oral rinse  7 mL Mouth Rinse QID  . [START ON 11/21/2015] aspirin  81 mg Oral Daily  . atorvastatin  80 mg Oral q1800  . atropine      . ceFEPime (MAXIPIME) IV  1 g Intravenous Q24H  . chlorhexidine gluconate (SAGE KIT)  15 mL Mouth Rinse BID  . chlorhexidine gluconate (SAGE KIT)  15 mL Mouth Rinse BID  . insulin aspart  0-20 Units Subcutaneous Q4H  . off the beat book   Does not apply Once  . sodium chloride flush  10-40 mL Intracatheter Q12H  . sodium chloride flush  3 mL Intravenous Q12H  . vancomycin  1,000 mg Intravenous Q24H    Allergies:  Allergies  Allergen Reactions  . Gabapentin Other (See Comments)    Extremely drunk and lethargic  . Lisinopril Other (See Comments)    Increased creatinine levels (pt currently taking low dose)  . Metformin And Related Other (See Comments)    Increased creatinine levels  . Biaxin [Clarithromycin]  Itching    Social History   Social History  . Marital Status: Divorced    Spouse Name: N/A  . Number of Children: N/A  . Years of Education: N/A   Occupational History  . Not on file.   Social History Main Topics  . Smoking status: Former Smoker -- 2.00 packs/day for 17 years    Types: Cigarettes    Quit date: 10/09/1975  . Smokeless tobacco: Never Used  . Alcohol Use: 0.0 oz/week    0 Standard drinks or equivalent per week     Comment: RARE  . Drug Use: No  . Sexual Activity: Not on file   Other Topics Concern  . Not on file   Social History Narrative     Family History  Problem Relation Age of Onset  . Heart failure Mother     died age 56  . Sudden death Father 62  . Heart disease Father   . Hypertension Father   . Heart attack Father   . Diabetes Brother   . Heart attack Brother   . AAA (abdominal aortic aneurysm) Brother      Review of Systems: All other systems reviewed and are otherwise negative except as noted above.  Physical Exam: Filed Vitals:   11/20/15 1115 11/20/15 1130 11/20/15 1145 11/20/15 1200  BP: 113/35 119/47 125/48 122/51  Pulse:      Temp:      TempSrc:    Oral  Resp: '30 30 30 27  '$ Weight:      SpO2:        GEN- The patient is ill appearing, intubated.   HEENT: normocephalic, atraumatic; sclera clear, conjunctiva pink; hearing intact; oropharynx clear; neck supple, no JVP Lungs- course BS b/l anterior Heart- Regular rate and rhythm, no significant murmurs, rubs or gallops, PMI not laterally displaced GI- soft, non-distended Extremities- LLE has 1+ edema, chronic looking skin changes, RLE BKA MS- no significant deformity or atrophy Skin- warm and  dry, no rash or lesion Psych- unable to assess Neuro- unable to assess  Labs:   Lab Results  Component Value Date   WBC 10.5 11/20/2015   HGB 10.0* 11/20/2015   HCT 32.7* 11/20/2015   MCV 84.5 11/20/2015   PLT 276 11/20/2015    Recent Labs Lab 11/20/15 1045  NA 132*  K  6.0*  CL 109  CO2 15*  BUN 49*  CREATININE 1.75*  CALCIUM 8.7*  PROT 5.7*  BILITOT 0.5  ALKPHOS 168*  ALT 57*  AST 54*  GLUCOSE 374*      Radiology/Studies:  Dg Chest 1 View 11/20/2015  CLINICAL DATA:  CHF.  New onset shortness of Breath EXAM: CHEST 1 VIEW COMPARISON:  11/19/2015 FINDINGS: Cardiomegaly. Bilateral perihilar and lower lobe opacities with probable layering effusions, similar to prior study, most compatible with CHF. No change since prior study. IMPRESSION: Stable CHF pattern.  Probable layering effusions. Electronically Signed   By: Rolm Baptise M.D.   On: 11/20/2015 09:37     EKG: AFib RVR, no ischemic changes TELEMETRY: AFib RVR, 5.76second pause, periods of junctional rhythm, curretnly in SR 70's  11/20/15: Echocardiogram Study Conclusions - Left ventricle: The cavity size was normal. Wall thickness was  increased in a pattern of moderate LVH. Systolic function was  vigorous. The estimated ejection fraction was in the range of 65%  to 70%. The study is not technically sufficient to allow  evaluation of LV diastolic function. - Aortic valve: Trileaflet. Sclerosis without stenosis. There was  mild regurgitation. Valve area (VTI): 1.91 cm^2. Valve area  (Vmax): 1.67 cm^2. Valve area (Vmean): 2.03 cm^2. - Left atrium: The atrium was mildly dilated. - Right ventricle: The cavity size was normal. Systolic function is  reduced. Lateral annulus peak S velocity: 6.85 cm/s. - Inferior vena cava: The vessel was normal in size. The  respirophasic diameter changes were in the normal range (= 50%),  consistent with normal central venous pressure. - Pericardium, extracardiac: A trivial pericardial effusion was  identified posterior to the heart. There was a left pleural  effusion.  - LVEF 65-70%, moderate LVH, normal wall motion, mild AI, mild LAE,  normal IVC, trivial pericardial effusion, left pleural effusion.  Assessment and Plan:   1. 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      stop nodal blocking agents, add amiodarone gtt to try and maintain SR  2.  Respiratory failure      Agree with diuresis, additional lasix this afternoon  3. CAD     No reports of CP  4. UTI     Urostomy tube     Hx of bladder cancer     mgmt with IM service  5. Encephalopathy     Nursing reports appeared to be improving with appropriate questions and answers by the patient on arrival to ICU  6. Hyperkalemia, CRI     Continue lytes with IM and critical  care    Signed, Tommye Standard, PA-C 11/20/2015 3:03 PM   I have seen and examined this patient with Tommye Standard.  Agree with above, note added to reflect my findings.  On exam, regular rhythm, no murmurs, course breath sounds.  Went into AF and had respiratory distress then bradycardic with junctional bradycardia.  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  Would avoid AV nodal blockers at this time.  Have started her on amiodarone for rhythm control and restarted her heparin.  Also redosed her lasix as she has course breath sounds and pulmonary edema on her CXR, positive 11+ liters.    Emoni Whitworth M. Kazumi Lachney MD 11/20/2015 4:04 PM

## 2015-11-20 NOTE — Code Documentation (Signed)
CODE BLUE NOTE  Patient Name: [name]   MRN: [id number]   Date of Birth/ Sex: 11-Aug-1941 , female      Admission Date: 11/18/2015  Attending Provider: Velvet Bathe, MD  Primary Diagnosis: Type 2 diabetes mellitus with vascular disease ([place])    Indication: Pt was in her usual state of health until this AM, when she was noted to be in respiratory arrest. Code blue was subsequently called. At the time of arrival on scene, ACLS protocol was underway.    Technical Description:  - CPR performance duration:  N/A   - Was defibrillation or cardioversion used? No   - Was external pacer placed? No  - Was patient intubated pre/post CPR? Yes    Medications Administered: Y = Yes; Blank = No Amiodarone    Atropine    Calcium    Epinephrine    Lidocaine    Magnesium    Norepinephrine    Phenylephrine    Sodium bicarbonate    Vasopressin     60mg  IV Lasix given prior to intubation Versed given after intubation when patient started to awaken  Post CPR evaluation:  - Final Status - Was patient successfully resuscitated ? Yes - What is current rhythm? Junctional bradycardia - What is current hemodynamic status? stable   Miscellaneous Information:  - Labs sent, including: BNP, ABG        - Additional notes/ transfer status: Transfer to [place], MD  11/20/2015, 9:45 AM

## 2015-11-20 NOTE — Consult Note (Signed)
PULMONARY / CRITICAL CARE MEDICINE   Name: [name] MRN: [id number] DOB: 05-26-1941    ADMISSION DATE:  11/18/2015 CONSULTATION DATE:  11/20/2014  REFERRING MD:  Wendee Beavers  CHIEF COMPLAINT:  AMS  HISTORY OF PRESENT ILLNESS:   75 year old female with PMH as below, which includes bladder ca s/p resection now with chronic urostomy, PVD s/p L fem/tib bypass, and r BKA. Also with AF, CAD and MI in 2014, and DM. She was seen in ED several times this month for chest and neck pain which was treated as musculoskeletal pain. She was also recently prescribed keflex for UTI. She the presented again to the ED 4/8 with complaints of altered mental status and had reportedly not been taking her keflex or insulin. At time of admission she was having hallucinations and was only oriented to herself. She was admitted to the hospitalist team to be managed for HHNK, AKI, AF RVR, and UTI/SIRS. 4/9 AF persisted and she developed progressive dyspnea. 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 Started on dopamine 4/10 SOB worsened and CXR was consistent pulmonary edema and CHF. She was also lethargic and rapid response was called. The patient eventually required ICU transfer and intubation.   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  PAST SURGICAL HISTORY: She  has past surgical history that includes I & D RIGHT BELOW KNEE AMPUTATION WOUND (06-10-2011); CYSTO / RESECTION BLADDER BX'S (04-01-11  &  11-26-10); Below knee leg amputation (05-02-2007); RIGHT FOOT I & D / REMOVAL NECROTIC BONE (JUN  &  AUG 2008); RIGHT GREAT TOE AMPUTATION (11-05-2006); Laparoscopic cholecystectomy (12-10-1999); Back surgery ([place]); Appendectomy (1963); Cataract extraction w/ intraocular lens  implant, bilateral; Cystoscopy with biopsy (10/14/2011); Coronary stent placement; Abdominal hysterectomy; Revision urostomy cutaneous; Revision urostomy cutaneous; abdominal angiogram (N/A, 09/08/2014); Femoral-popliteal Bypass Graft (Left, 09/12/2014); and Amputation (Left, 09/14/2014).  Allergies  Allergen Reactions  . Gabapentin Other (See Comments)    Extremely drunk and lethargic  . Lisinopril Other (See Comments)    Increased creatinine levels (pt currently taking low dose)  . Metformin And Related Other (See Comments)    Increased creatinine levels  . Biaxin [Clarithromycin] Itching    No current facility-administered medications on file prior to encounter.   Current Outpatient Prescriptions on File Prior to Encounter  Medication Sig  . acetaminophen (TYLENOL) 500 MG tablet Take 1,000 mg by mouth every 6 (six) hours as needed for mild pain.   Marland Kitchen atorvastatin (LIPITOR) 80 MG tablet TAKE 1 TABLET (80 MG TOTAL) BY MOUTH EVERY EVENING.  . cephALEXin (KEFLEX) 500 MG capsule Take 1 capsule (500 mg total) by mouth 2 (two) times daily.  . furosemide (LASIX) 40 MG tablet Take 80 mg by mouth 3 (three) times daily.   Marland Kitchen HYDROcodone-acetaminophen (NORCO/VICODIN) 5-325 MG tablet Take 1-2 tablets by mouth every 4 (four) hours as  needed for moderate pain or severe pain.  Marland Kitchen insulin glargine (LANTUS) 100 UNIT/ML injection Inject 55-60 Units into the skin 2 (two) times daily. 55 units in the  morning and 60 units at night  . insulin lispro (HUMALOG) 100 UNIT/ML injection Inject 20-50 Units into the skin daily as needed for high blood sugar. PER SLIDING SCALE  . isosorbide mononitrate (IMDUR) 30 MG 24 hr tablet TAKE 1 TABLET (30 MG TOTAL) BY MOUTH DAILY.  . Melatonin 10 MG TABS Take 10 mg by mouth at bedtime.   . metoprolol succinate (TOPROL-XL) 100 MG 24 hr tablet TAKE 1 TABLET (100 MG TOTAL) BY MOUTH EVERY EVENING. TAKE WITH OR IMMEDIATELY FOLLOWING A MEAL.  . Multiple Vitamins-Minerals (PRESERVISION AREDS PO) Take 1 capsule by mouth 2 (two) times daily.  Marland Kitchen omeprazole (PRILOSEC) 20 MG capsule Take 20 mg by mouth every evening.   Marland Kitchen oxyCODONE-acetaminophen (PERCOCET) 5-325 MG tablet Take 1-2 tablets by mouth every 6 (six) hours as needed. (Patient taking differently: Take 1-2 tablets by mouth every 6 (six) hours as needed for severe pain. )  . polyethylene glycol (MIRALAX / GLYCOLAX) packet Take 17 g by mouth daily. (Patient taking differently: Take 17 g by mouth daily as needed for moderate constipation. )  . potassium chloride SA (K-DUR,KLOR-CON) 20 MEQ tablet Take 20 mEq by mouth daily.   . Psyllium (VEGETABLE LAXATIVE PO) Take 4 tablets by mouth at bedtime.    FAMILY HISTORY:  Her indicated that her mother is deceased. She indicated that her father is deceased. She indicated that her maternal grandmother is deceased. She indicated that her maternal grandfather is deceased. She indicated that her paternal grandmother is deceased. She indicated that her paternal grandfather is deceased.   SOCIAL HISTORY: She  reports that she quit smoking about 40 years ago. Her smoking use included Cigarettes. She has a 34 pack-year smoking history. She has never used smokeless tobacco. She reports that she drinks alcohol. She reports that she does not use illicit drugs.  REVIEW OF SYSTEMS:     SUBJECTIVE:    VITAL SIGNS: BP 69/56 mmHg  Pulse 53  Temp(Src) 97.8 F (36.6 C) (Axillary)  Resp  11  Wt 97.6 kg (215 lb 2.7 oz)  SpO2 91%  HEMODYNAMICS:    VENTILATOR SETTINGS: Vent Mode:  [-] PRVC FiO2 (%):  [50 %-100 %] 100 % Set Rate:  [18 bmp] 18 bmp Vt Set:  [400 mL] 400 mL PEEP:  [5 cmH20] 5 cmH20 Plateau Pressure:  [23 cmH20] 23 cmH20  INTAKE / OUTPUT: I/O last 3 completed shifts: In: 9231 [P.O.:960; I.V.:8021; IV Piggyback:250] Out: 2575 [Urine:2575]  PHYSICAL EXAMINATION: General:  Obese elderly female Neuro:  obtunded HEENT:  [place]/AT, PERRL Cardiovascular:  IRIR, rate controlled, no MRG, trace edema Lungs:  rales Abdomen:  Soft, non-distended Musculoskeletal: No acute deformity or ROM limitation Skin:  Grossly intact  LABS:  BMET  Recent Labs Lab 11/18/15 1614 11/19/15 0445 11/20/15 0130  NA 131* 132* 133*  K 5.2* 4.9 5.2*  CL 105 106 108  CO2 16* 15* 16*  BUN 63* 56* 49*  CREATININE 2.37* 1.99* 1.47*  GLUCOSE 286* 260* 239*    Electrolytes  Recent Labs Lab 11/18/15 1614 11/19/15 0445 11/19/15 0519 11/20/15 0130  CALCIUM 8.9  8.8* 8.6*  --  9.4  MG 1.4*  --  1.7  --   PHOS 3.7  --   --   --     CBC  Recent Labs Lab 11/18/15  [id number] 11/19/15 0445 11/20/15 0445  WBC 7.8 8.0 9.1  HGB 10.6* 9.7* 10.6*  HCT 34.2* 30.8* 34.1*  PLT 185 219 293    Coag's No results for input(s): APTT, INR in the last 168 hours.  Sepsis Markers  Recent Labs Lab 11/18/15 1614 11/20/15 0130 11/20/15 0445  LATICACIDVEN 1.2 0.9 0.8    ABG  Recent Labs Lab 11/19/15 2332  PHART 7.198*  PCO2ART 42.2  PO2ART 72.8*    Liver Enzymes  Recent Labs Lab 11/14/15 2035 11/19/15 0445  AST 19 37  ALT 27 54  ALKPHOS 142* 146*  BILITOT 0.7 0.5  ALBUMIN 3.3* 1.9*    Cardiac Enzymes  Recent Labs Lab 11/18/15 1614 11/18/15 1942  TROPONINI 0.08* 0.07*    Glucose  Recent Labs Lab 11/19/15 1206 11/19/15 1651 11/19/15 2019 11/19/15 2349 11/20/15 0415 11/20/15 0722  GLUCAP 207* 218* 215* 190* 233* 241*    Imaging Dg Chest 1  View  11/20/2015  CLINICAL DATA:  CHF.  New onset shortness of Breath EXAM: CHEST 1 VIEW COMPARISON:  11/19/2015 FINDINGS: Cardiomegaly. Bilateral perihilar and lower lobe opacities with probable layering effusions, similar to prior study, most compatible with CHF. No change since prior study. IMPRESSION: Stable CHF pattern.  Probable layering effusions. Electronically Signed   By: Rolm Baptise M.D.   On: 11/20/2015 09:37   Dg Chest Port 1 View  11/19/2015  CLINICAL DATA:  Dyspnea EXAM: PORTABLE CHEST 1 VIEW COMPARISON:  None FINDINGS: There is moderate cardiomegaly. There is vascular and interstitial prominence. There are pleural effusions. There is perihilar ground-glass opacity. IMPRESSION: Congestive heart failure. Electronically Signed   By: Andreas Newport M.D.   On: 11/19/2015 23:39    STUDIES:  Echo 4/10 >  CULTURES: BCx2 4/8 > Coag neg staph in pediatric bottle only Urine 4/8 > multiple species  ANTIBIOTICS: Rocephin 4/8 > Vancomycin 4/8 >  SIGNIFICANT EVENTS: 4/8 admit 4/9 AFRVR to junctional brady. Progressive dyspnea 4/10 respiratory arrest > to ICU > intubated   LINES/TUBES: ETT 4/10 > LIJ CVL 4/10 >  DISCUSSION: 75 year old female with PMH of CAD, and bladder Ca s/p resection. Admitted 4/8 for UTI/AF RVR and developed progressive dyspnea attributed to CHF in setting AF. Then converted to junctional brady. 4/10 suffered respiratory arrest requiring intubation. Plan to support with vent, dopamine. Continue broad spectrum antibiotics.   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   P:  Telemetry monitoring Cardiology following Dopamine, titrate  to keep HR > 60 Diuresis as BP/Renal function tolerate (currently hypotensive) Echo pending ?pacemaker  RENAL A:   AKI > improving Hyperkalemia  P:   Repeat BMP Lock IVF Diurese as tolerated  GASTROINTESTINAL A:   No acute issues  P:   NPO for now Protonix for SUP  HEMATOLOGIC A:   Anticoagulation in setting PAF  P:  Continue heparin gtt per pharmacy  INFECTIOUS A:   SIRS/Sepsis UTI Bacteremia vs contamination (coag neg staph in one bottle)  P:   Continue rocephin, vancomycin as above Trend WBC and fever curve Repeat BC  ENDOCRINE A:   DM  P:   CBG and SSI monitoring DC lantus while NPO  NEUROLOGIC A:  Acute metabolic encephalopathy  P:   RASS goal: -1 to -2 Intermittent sedation   FAMILY  - Updates:   - Inter-disciplinary family meet or Palliative Care meeting due by:  4/17   Georgann Housekeeper, AGACNP-BC [place] Pulmonology/Critical Care Pager [phone] or [phone]  11/20/2015 11:08 AM  Attending Note:  75 year old female with bladder cancer history who was admitted for SOB and tachy/brady syndrome.  Called emergently bedside, patient was bradycardic, hypotensive and being actively bagged.  Patient was confirmed full code and was intubated intubated prior to cardiac arrest.  Code was called.  Post intubation, patient remains in severe metabolic acidosis.  Family is to arrive.  Will need code status discussion.  Patient remains critical.  On exam, output from urinary ostomy is purulent.  Will broaden abx spectrum.  Change rocephin to cefepime awaiting sensitivities.  Will place on full vent support and change IVF to a bicarb drip.  The patient is critically ill with multiple organ systems failure and requires high complexity decision making for assessment and support, frequent evaluation and titration of therapies, application of advanced monitoring technologies and extensive interpretation of multiple databases.   Critical Care Time  devoted to patient care services described in this note is  45  Minutes. This time reflects time of care of this signee Dr [name]. This critical care time does not reflect procedure time, or teaching time or supervisory time of PA/NP/Med student/Med Resident etc but could involve care discussion time.  Rush Farmer, M.D. [facility] Pulmonary/Critical Care Medicine. Pager: [phone]. After hours pager: [phone].

## 2015-11-20 NOTE — Progress Notes (Signed)
eLink Physician-Brief Progress Note Patient Name: [name] DOB: [birth year] MRN: [id number]   Date of Service  11/20/2015  HPI/Events of Note  AFIB with RVR - ventricular rate = 114 -130. Currently on an Amiodarone IV infusion.   eICU Interventions  Will order: 1. Amiodarone 150 mg IV over 10 minutes now. 2. BMP and Mg++ level now.      Intervention Category Major Interventions: Arrhythmia - evaluation and management  Evelyna Folker [place] 11/20/2015, 9:32 PM

## 2015-11-20 NOTE — Significant Event (Signed)
Rapid Response Event Note  Overview: Time Called: 0917 Arrival Time: 0922 Event Type: Cardiac, Respiratory  Initial Focused Assessment: Upon my arrival patient able to open eyes to stimulation but not talking and falls rapidly back asleep. Poor respiratory effort, with bilat lung sounds BP 86/66  Brady 35  RR 22  O2 sat 88% on NRB  Interventions: Notified Dr Lianne Cure RT for Bipap and RN getting atropine. Elink notified CCM  Patient with pulse but with guppy breathing. [place] called: began bagging patient Code cart at bedside.  Atropine given, NS bolus started Dr Nelda Marseille at bedside, patient intubated Dr Wendee Beavers at bedside Patient transported to 2H03 via bed with zoll monitor  Event Summary: Name of Physician Notified: vega at [phone]  Name of Consulting Physician Notified: [email or web address] at [place]  Outcome: Transferred (Comment)  Event End Time: [address]

## 2015-11-20 NOTE — Consult Note (Signed)
WOC ostomy consult note Patient in intubated but awake. Able to nod head yes and no to answer questions.  Stoma type/location: upper middle quadrant Stomal assessment/size: 1 1/2" oval shaped, slightly budded, pink, moist Peristomal assessment: intact Treatment options for stomal/peristomal skin: using 2" barrier ring around stoma to aid in seal with precut pouch Output yellow urine with mucous (normal with ileal conduit) Ostomy pouching: 2pc. Convex with 2" barrier ring. Precut wafer, and belt Education provided:  Daughter at bedside that assist her at home with ostomy care. They change weekly.  Daughter asked about how to keep patient dry during pouch change, I have explained ways to accomplish this by using toilet paper wick or slender tampon.  Daughter is thankful for this assistance.  Also suggested changing pouch in the am when the patient is the driest.  She again was thankful for this suggestion. Provided patient with 2 additional adapters for her bedside drainage bag and a new belt per the family and patient request.   Discussed POC with patient and bedside nurse.  Re consult if needed, will not follow at this time. Thanks  Glendell Schlottman [place], [place] [phone])

## 2015-11-20 NOTE — Progress Notes (Signed)
Pt brought over by [facility] therapist and Rapid Response RN. Pt was intubated in [place] and manually ventilated via AMBU bag to Room 2H03 where she was placed on current vent settings per Dr. Nelda Marseille.

## 2015-11-20 NOTE — Progress Notes (Signed)
DR Radford Pax in to access pt ordered and given Lasix 60 mg IV. Ordered stat 12 lead EKG.

## 2015-11-20 NOTE — Progress Notes (Signed)
*  PRELIMINARY RESULTS* Echocardiogram 2D Echocardiogram has been performed.  [name] 11/20/2015, 11:55 AM

## 2015-11-21 ENCOUNTER — Inpatient Hospital Stay (HOSPITAL_COMMUNITY): Payer: PPO

## 2015-11-21 DIAGNOSIS — I4891 Unspecified atrial fibrillation: Secondary | ICD-10-CM | POA: Diagnosis not present

## 2015-11-21 DIAGNOSIS — R8290 Unspecified abnormal findings in urine: Secondary | ICD-10-CM | POA: Diagnosis not present

## 2015-11-21 DIAGNOSIS — I480 Paroxysmal atrial fibrillation: Secondary | ICD-10-CM

## 2015-11-21 DIAGNOSIS — E1159 Type 2 diabetes mellitus with other circulatory complications: Secondary | ICD-10-CM | POA: Diagnosis not present

## 2015-11-21 DIAGNOSIS — R570 Cardiogenic shock: Secondary | ICD-10-CM | POA: Diagnosis not present

## 2015-11-21 DIAGNOSIS — E872 Acidosis: Secondary | ICD-10-CM | POA: Diagnosis not present

## 2015-11-21 DIAGNOSIS — N179 Acute kidney failure, unspecified: Secondary | ICD-10-CM | POA: Diagnosis not present

## 2015-11-21 DIAGNOSIS — R001 Bradycardia, unspecified: Secondary | ICD-10-CM | POA: Diagnosis not present

## 2015-11-21 DIAGNOSIS — J90 Pleural effusion, not elsewhere classified: Secondary | ICD-10-CM | POA: Diagnosis not present

## 2015-11-21 DIAGNOSIS — Z4682 Encounter for fitting and adjustment of non-vascular catheter: Secondary | ICD-10-CM | POA: Diagnosis not present

## 2015-11-21 DIAGNOSIS — G9340 Encephalopathy, unspecified: Secondary | ICD-10-CM | POA: Diagnosis not present

## 2015-11-21 DIAGNOSIS — J9601 Acute respiratory failure with hypoxia: Secondary | ICD-10-CM | POA: Diagnosis not present

## 2015-11-21 LAB — BASIC METABOLIC PANEL
Anion gap: 12 (ref 5–15)
BUN: 40 mg/dL — ABNORMAL HIGH (ref 6–20)
CALCIUM: 9 mg/dL (ref 8.9–10.3)
CO2: 21 mmol/L — AB (ref 22–32)
CREATININE: 1.35 mg/dL — AB (ref 0.44–1.00)
Chloride: 103 mmol/L (ref 101–111)
GFR, EST AFRICAN AMERICAN: 44 mL/min — AB (ref >60)
GFR, EST NON AFRICAN AMERICAN: 38 mL/min — AB (ref >60)
GLUCOSE: 184 mg/dL — AB (ref 65–99)
Potassium: 3.4 mmol/L — ABNORMAL LOW (ref 3.5–5.1)
Sodium: 136 mmol/L (ref 135–145)

## 2015-11-21 LAB — GLUCOSE, CAPILLARY
GLUCOSE-CAPILLARY: 161 mg/dL — AB (ref 65–99)
GLUCOSE-CAPILLARY: 191 mg/dL — AB (ref 65–99)
GLUCOSE-CAPILLARY: 229 mg/dL — AB (ref 65–99)
GLUCOSE-CAPILLARY: 282 mg/dL — AB (ref 65–99)
Glucose-Capillary: 220 mg/dL — ABNORMAL HIGH (ref 65–99)
Glucose-Capillary: 171 mg/dL — ABNORMAL HIGH (ref 65–99)
Glucose-Capillary: 153 mg/dL — ABNORMAL HIGH (ref 65–99)
Glucose-Capillary: 198 mg/dL — ABNORMAL HIGH (ref 65–99)

## 2015-11-21 LAB — POCT I-STAT 3, ART BLOOD GAS (G3+)
Acid-base deficit: 4 mmol/L — ABNORMAL HIGH (ref 0.0–2.0)
Bicarbonate: 19 mEq/L — ABNORMAL LOW (ref 20.0–24.0)
O2 SAT: 90 %
PCO2 ART: 25.4 mmHg — AB (ref 35.0–45.0)
PO2 ART: 53 mmHg — AB (ref 80.0–100.0)
TCO2: 20 mmol/L (ref 0–100)
pH, Arterial: 7.482 — ABNORMAL HIGH (ref 7.350–7.450)

## 2015-11-21 LAB — MAGNESIUM: Magnesium: 1.3 mg/dL — ABNORMAL LOW (ref 1.7–2.4)

## 2015-11-21 LAB — HEPARIN LEVEL (UNFRACTIONATED): HEPARIN UNFRACTIONATED: 0.37 [IU]/mL (ref 0.30–0.70)

## 2015-11-21 LAB — CULTURE, BLOOD (ROUTINE X 2)

## 2015-11-21 LAB — CBC
HCT: 28.4 % — ABNORMAL LOW (ref 36.0–46.0)
HEMOGLOBIN: 9.2 g/dL — AB (ref 12.0–15.0)
MCH: 25.6 pg — ABNORMAL LOW (ref 26.0–34.0)
MCHC: 32.4 g/dL (ref 30.0–36.0)
MCV: 79.1 fL (ref 78.0–100.0)
Platelets: 240 10*3/uL (ref 150–400)
RBC: 3.59 MIL/uL — AB (ref 3.87–5.11)
RDW: 14.1 % (ref 11.5–15.5)
WBC: 9.2 10*3/uL (ref 4.0–10.5)

## 2015-11-21 LAB — PHOSPHORUS: Phosphorus: 2 mg/dL — ABNORMAL LOW (ref 2.5–4.6)

## 2015-11-21 MED ORDER — INSULIN GLARGINE 100 UNIT/ML ~~LOC~~ SOLN
15.0000 [IU] | Freq: Two times a day (BID) | SUBCUTANEOUS | Status: DC
  Administered 2015-11-21 (×2): 15 [IU] via SUBCUTANEOUS
  Filled 2015-11-21 (×4): qty 0.15

## 2015-11-21 MED ORDER — VITAL HIGH PROTEIN PO LIQD
1000.0000 mL | ORAL | Status: DC
  Administered 2015-11-21 (×2)
  Administered 2015-11-21: 1000 mL
  Administered 2015-11-22 (×2)

## 2015-11-21 MED ORDER — PHENYLEPHRINE HCL 10 MG/ML IJ SOLN
0.0000 ug/min | INTRAVENOUS | Status: DC
  Administered 2015-11-21: 20 ug/min via INTRAVENOUS
  Filled 2015-11-21: qty 4

## 2015-11-21 MED ORDER — FENTANYL CITRATE (PF) 2500 MCG/50ML IJ SOLN
25.0000 ug/h | INTRAMUSCULAR | Status: DC
  Administered 2015-11-21: 50 ug/h via INTRAVENOUS
  Filled 2015-11-21: qty 50

## 2015-11-21 MED ORDER — FUROSEMIDE 10 MG/ML IJ SOLN
60.0000 mg | Freq: Two times a day (BID) | INTRAMUSCULAR | Status: DC
  Administered 2015-11-21: 60 mg via INTRAVENOUS
  Filled 2015-11-21: qty 6

## 2015-11-21 MED ORDER — FENTANYL BOLUS VIA INFUSION
25.0000 ug | INTRAVENOUS | Status: DC | PRN
  Filled 2015-11-21: qty 25

## 2015-11-21 MED ORDER — AMIODARONE HCL IN DEXTROSE 360-4.14 MG/200ML-% IV SOLN
60.0000 mg/h | INTRAVENOUS | Status: AC
  Administered 2015-11-21: 60 mg/h via INTRAVENOUS
  Filled 2015-11-21: qty 200

## 2015-11-21 MED ORDER — FUROSEMIDE 10 MG/ML IJ SOLN
40.0000 mg | Freq: Four times a day (QID) | INTRAMUSCULAR | Status: AC
  Administered 2015-11-21 (×3): 40 mg via INTRAVENOUS
  Filled 2015-11-21 (×3): qty 4

## 2015-11-21 MED ORDER — MIDAZOLAM HCL 2 MG/2ML IJ SOLN: 1.0000 mg | INTRAMUSCULAR | Status: DC | PRN

## 2015-11-21 MED ORDER — POTASSIUM CHLORIDE CRYS ER 20 MEQ PO TBCR
20.0000 meq | EXTENDED_RELEASE_TABLET | ORAL | Status: AC
  Administered 2015-11-21 (×2): 20 meq via ORAL
  Filled 2015-11-21 (×2): qty 1

## 2015-11-21 MED ORDER — FENTANYL CITRATE (PF) 100 MCG/2ML IJ SOLN: 50.0000 ug | Freq: Once | INTRAMUSCULAR | Status: DC

## 2015-11-21 MED ORDER — MAGNESIUM SULFATE 2 GM/50ML IV SOLN
2.0000 g | Freq: Once | INTRAVENOUS | Status: AC
  Administered 2015-11-21: 2 g via INTRAVENOUS
  Filled 2015-11-21: qty 50

## 2015-11-21 MED ORDER — AMIODARONE HCL IN DEXTROSE 360-4.14 MG/200ML-% IV SOLN
30.0000 mg/h | INTRAVENOUS | Status: DC
  Administered 2015-11-21 – 2015-11-23 (×6): 30 mg/h via INTRAVENOUS
  Filled 2015-11-21 (×6): qty 200

## 2015-11-21 MED ORDER — POTASSIUM CHLORIDE 20 MEQ/15ML (10%) PO SOLN
40.0000 meq | Freq: Three times a day (TID) | ORAL | Status: AC
  Administered 2015-11-21 (×2): 40 meq
  Filled 2015-11-21 (×2): qty 30

## 2015-11-21 NOTE — Progress Notes (Signed)
SUBJECTIVE: The patient is intubated, awake  . antiseptic oral rinse  7 mL Mouth Rinse 10 times per day  . aspirin  81 mg Oral Daily  . atorvastatin  80 mg Oral q1800  . ceFEPime (MAXIPIME) IV  1 g Intravenous Q24H  . chlorhexidine gluconate (SAGE KIT)  15 mL Mouth Rinse BID  . insulin aspart  0-20 Units Subcutaneous Q4H  . off the beat book   Does not apply Once  . pantoprazole (PROTONIX) IV  40 mg Intravenous Q24H  . sodium chloride flush  10-40 mL Intracatheter Q12H  . sodium chloride flush  3 mL Intravenous Q12H  . vancomycin  1,000 mg Intravenous Q24H   . sodium chloride Stopped (11/21/15 0000)  . amiodarone 60 mg/hr (11/21/15 0650)  . DOPamine Stopped (11/20/15 1415)  . heparin 1,300 Units/hr (11/20/15 2249)  .  sodium bicarbonate  infusion 1000 mL 50 mL/hr at 11/20/15 2000    OBJECTIVE: Physical Exam: Filed Vitals:   11/21/15 0315 11/21/15 0400 11/21/15 0500 11/21/15 0600  BP:  129/51 128/68 100/58  Pulse: 138 118  101  Temp:  99 F (37.2 C)    TempSrc:  Oral    Resp: '30 27 30 30  '$ Weight:      SpO2: 100% 100%  100%    Intake/Output Summary (Last 24 hours) at 11/21/15 6962 Last data filed at 11/21/15 0600  Gross per 24 hour  Intake 2339.91 ml  Output   2950 ml  Net -610.09 ml    Telemetry reveals AFib 100-110 generally  GEN- The patient remains intubated, awake and asks questions via a white board Head- normocephalic, atraumatic Eyes-  Sclera clear, conjunctiva pink Ears- hearing intact Lungs- intubated, coarse BS b/l, anteriorly Heart- irregular rate/rhythm, no significant murmurs, no rubs or gallops GI- soft, NT, ND Extremities- +2 edema, cyanosis, or edema Skin- no rash or lesion Psych- euthymic mood, full affect Neuro- no gross deficits appreciated  LABS: Basic Metabolic Panel:  Recent Labs  11/18/15 1614  11/20/15 1045 11/20/15 2216  NA 131*  < > 132* 136  K 5.2*  < > 6.0* 3.8  CL 105  < > 109 108  CO2 16*  < > 15* 17*  GLUCOSE  286*  < > 374* 302*  BUN 63*  < > 49* 48*  CREATININE 2.37*  < > 1.75* 1.43*  CALCIUM 8.9  8.8*  < > 8.7* 9.1  MG 1.4*  < > 1.7 1.5*  PHOS 3.7  --  5.4*  --   < > = values in this interval not displayed. Liver Function Tests:  Recent Labs  11/19/15 0445 11/20/15 1045  AST 37 54*  ALT 54 57*  ALKPHOS 146* 168*  BILITOT 0.5 0.5  PROT 5.6* 5.7*  ALBUMIN 1.9* 1.9*   CBC:  Recent Labs  11/20/15 1045 11/21/15 0335  WBC 10.5 9.2  NEUTROABS 9.4*  --   HGB 10.0* 9.2*  HCT 32.7* 28.4*  MCV 84.5 79.1  PLT 276 240   Cardiac Enzymes:  Recent Labs  11/18/15 1614 11/18/15 1942 11/20/15 1045  TROPONINI 0.08* 0.07* <0.03   Hemoglobin A1C:  Recent Labs  11/18/15 0841  HGBA1C 14.8*   Thyroid Function Tests:  Recent Labs  11/18/15 1614  TSH 0.735    RADIOLOGY: Dg Chest 1 View 11/20/2015  CLINICAL DATA:  CHF.  New onset shortness of Breath EXAM: CHEST 1 VIEW COMPARISON:  11/19/2015 FINDINGS: Cardiomegaly. Bilateral perihilar and lower lobe opacities with  probable layering effusions, similar to prior study, most compatible with CHF. No change since prior study. IMPRESSION: Stable CHF pattern.  Probable layering effusions. Electronically Signed   By: Rolm Baptise M.D.   On: 11/20/2015 09:37    Dg Chest Port 1 View 11/20/2015  CLINICAL DATA:  Central line placement EXAM: PORTABLE CHEST 1 VIEW COMPARISON:  11/20/2015 FINDINGS: Cardiomegaly again noted. Atherosclerotic calcifications of thoracic aorta. There is endotracheal tube in place with tip 1.5 cm above the carina. There is left IJ central line. The tip of the central line is obscured by overlying chest wall pad. Persistent congestion/pulmonary edema and bilateral pleural effusion. No pneumothorax. IMPRESSION: Endotracheal tube in place with tip 1.5 cm above the carina. There is left IJ central line. The tip of the central line is obscured by overlying chest wall pad. Persistent congestion/pulmonary edema and bilateral  pleural effusion. No pneumothorax. A repeat x-ray is recommended. Electronically Signed   By: Lahoma Crocker M.D.   On: 11/20/2015 10:44    ASSESSMENT AND PLAN:  1.  New PAFib, Tachy-brady, suspect secondary to acute/ongoing medical issues, metabolic derangement.  Continue  amiodarone gtt       CHA2DS2Vasdc is at least 4 on heparin gtt       BP stable  2. Respiratory failure  remains fluid +      Continue IV diuresis  3. CAD  No reports of CP  4. UTI  Urostomy tube  Hx of bladder cancer  mgmt with IM service  5. Encephalopathy  Nursing reports appeared to be improving with appropriate questions and answers by the patient on arrival to ICU  6. Hyperkalemia, CRI  Continue lytes with IM and critical care  7. DM OOC     Remains on insulin gtt  Tommye Standard, PA-C 11/21/2015 7:12 AM   I have seen and examined this patient with Tommye Standard.  Agree with above, note added to reflect my findings.  On exam, irregular rhythm, no murmurs, course breath sounds.  Remains intubated today but answering questions and using a white board to ask.  Went back into AF.  On amiodarone GGT for AF.  Jemiah Cuadra monitor for pauses.  She is volume overloaded which may be influencing her AF, have given her IV lasix and would continue to diurese.    Halayna Blane M. Seymour Pavlak MD 11/21/2015 4:31 PM

## 2015-11-21 NOTE — Progress Notes (Signed)
[place] for Heparin Indication: atrial fibrillation  Allergies  Allergen Reactions  . Gabapentin Other (See Comments)    Extremely drunk and lethargic  . Lisinopril Other (See Comments)    Increased creatinine levels (pt currently taking low dose)  . Metformin And Related Other (See Comments)    Increased creatinine levels  . Biaxin [Clarithromycin] Itching    Patient Measurements: Height/Weight from 11/14/2015 Ht. 5'2" Wt. 89.8kg Heparin Dosing Weight: 70.8kg  Vital Signs: Temp: 99 F (37.2 C) (04/11 0400) Temp Source: Oral (04/11 0400) BP: 155/98 mmHg (04/11 0728) Pulse Rate: 83 (04/11 0728)  Labs:  Recent Labs  11/18/15 1614 11/18/15 1942  11/19/15 0445 11/20/15 0130 11/20/15 0445 11/20/15 1045 11/20/15 2216 11/21/15 0335  HGB  --   --   < > 9.7*  --  10.6* 10.0*  --  9.2*  HCT  --   --   < > 30.8*  --  34.1* 32.7*  --  28.4*  PLT  --   --   < > 219  --  293 276  --  240  HEPARINUNFRC 0.50  --   --  0.53  --  0.57  --   --  0.37  CREATININE 2.37*  --   --  1.99* 1.47*  --  1.75* 1.43*  --   TROPONINI 0.08* 0.07*  --   --   --   --  <0.03  --   --   < > = values in this interval not displayed. Estimated Creatinine Clearance: 37.7 mL/min (by C-G formula based on Cr of 1.43).  Medical History: Past Medical History  Diagnosis Date  . Hypertension   . Hyperlipidemia   . History of atrial tachycardia   . Peripheral vascular disease (HCC) S/P RIGHT BKA  . Peripheral neuropathy (HCC) LEGS AND HANDS  . 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 GERD (gastroesophageal reflux disease)   . Anemia   . Blood transfusion   . Macular degeneration of both eyes   . Glaucoma   . Retinopathy due to secondary diabetes mellitus ([place])   . History of bladder cancer TCC AND CIS  . Myocardial infarction ([place])   . Presence of urostomy ([place])   . Type 2 diabetes mellitus with vascular disease ([name])   . CKD (chronic kidney  disease) stage 3, GFR 30-59 ml/min 01/26/2013  . Obesity (BMI 30-39.9)   . CAD (coronary artery disease) 01/26/2013    Evidently circumflex stent in 2014 following bladder cancer surgery for old myocardial infarction.    Assessment: This patient is known to pharmacy from past heparin therapy (2014).  She presents with atrial fibrillation and we have been asked to initiate IV heparin for her.  She required higher than calculated dose using adjusted body weight in the past.    Heparin level = 0.37 at goal. No bleeding issues noted. CBC stable  Goal of Therapy:  Heparin level 0.3-0.7 units/ml Monitor platelets by anticoagulation protocol: Yes   Plan:  Continue Heparin infusion at 1300 units / hr Daily Heparin level, CBC  Uvaldo Rising, BCPS  Clinical Pharmacist Pager [phone]  11/21/2015 8:04 AM

## 2015-11-21 NOTE — Progress Notes (Signed)
Notified MD of periodic restlessness associated with SOB, complaint of pain, and increasing HR. Lungs coarse post suction of copious secretions. Plan to continue treating pain and suctioning PRN. HR back down to low 100's.

## 2015-11-21 NOTE — Progress Notes (Signed)
Initial Nutrition Assessment  DOCUMENTATION CODES:   Obesity unspecified  INTERVENTION:   Initiate Vital High Protein @ 20 ml/hr via OG tube and increase by 10 ml every 4 hours to goal rate of 50 ml/hr.   Tube feeding regimen provides 1200 kcal, 105 grams of protein, and 1003 ml of H2O.   NUTRITION DIAGNOSIS:   Inadequate oral intake related to inability to eat as evidenced by NPO status.  GOAL:   Provide needs based on ASPEN/SCCM guidelines  MONITOR:   Skin, I & O's, Vent status, Labs, TF tolerance  REASON FOR ASSESSMENT:   Consult Enteral/tube feeding initiation and management  ASSESSMENT:   Pt with PMH of CAD, and bladder Ca s/p resection with chronic urostomy, PVD s/p L fem/tib bypass and R BKA, DM with concern for noncompliance with insulin. Admitted 4/8 for UTI/AF RVR and developed progressive dyspnea attributed to CHF in setting AF. Then converted to junctional brady. 4/10 suffered respiratory arrest requiring intubation.    4/8-4/9 meal completion 10-50% 4/10 intubated 4/11 ARDS protocol  Patient is currently intubated on ventilator support MV: 5.3 L/min Temp (24hrs), Avg:98.7 F (37.1 C), Min:97.1 F (36.2 C), Max:99.9 F (37.7 C)  Medications reviewed and include: magnesium sulfate, potassium chloride Labs reviewed: magnesium low 1.5 CBG's: 161-220 Urostomy output: 2950 ml 4/10 Discussed with RN.  Nutrition-Focused physical exam completed. Findings are no fat depletion, no muscle depletion, and moderate edema.  Pt awake and alert on vent. Denies any recent weight changes.    Diet Order:  Diet NPO time specified  Skin:  Wound (see comment) (excoriated in abd folds)  Last BM:  4/3  Height:   Ht Readings from Last 1 Encounters:  11/14/15 5\' 2"  (1.575 m)    Weight:   Wt Readings from Last 1 Encounters:  11/20/15 215 lb 2.7 oz (97.6 kg)    Ideal Body Weight:  42.5 kg (adj for R BKA)  BMI:  45 - adjusted for R BKA  Estimated Nutritional  Needs:   Kcal:  [id number]  Protein:  >/= 100 grams  Fluid:  >1.5 L/day  EDUCATION NEEDS:   No education needs identified at this time  [place], [place], [place] Pager [phone] After Hours Pager

## 2015-11-21 NOTE — Progress Notes (Signed)
Dr. Ashby Dawes notified of pt crying, writing that she wants to die.  Discussed fentanyl 50 mcg every 2 hours is not keeping her pain under control.  Orders received.  Will continue to monitor pt closely.

## 2015-11-21 NOTE — Consult Note (Signed)
PULMONARY / CRITICAL CARE MEDICINE   Name: [name] MRN: [id number] DOB: 1941-03-01    ADMISSION DATE:  11/18/2015 CONSULTATION DATE:  11/20/2014  REFERRING MD:  Wendee Beavers  CHIEF COMPLAINT:  AMS  HISTORY OF PRESENT ILLNESS:   75 year old female with PMH as below, which includes bladder ca s/p resection now with chronic urostomy, PVD s/p L fem/tib bypass, and r BKA. Also with AF, CAD and MI in 2014, and DM. She was seen in ED several times this month for chest and neck pain which was treated as musculoskeletal pain. She was also recently prescribed keflex for UTI. She the presented again to the ED 4/8 with complaints of altered mental status and had reportedly not been taking her keflex or insulin. At time of admission she was having hallucinations and was only oriented to herself. She was admitted to the hospitalist team to be managed for HHNK, AKI, AF RVR, and UTI/SIRS. 4/9 AF persisted and she developed progressive dyspnea. 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 Started on dopamine 4/10 SOB worsened and CXR was consistent pulmonary edema and CHF. She was also lethargic and rapid response was called. The patient eventually required ICU transfer and intubation.   SUBJECTIVE:  A-fib with RVR overnight, amiodarone started.  VITAL SIGNS: BP 116/59 mmHg  Pulse 132  Temp(Src) 99 F (37.2 C) (Oral)  Resp 30  Wt 97.6 kg (215 lb 2.7 oz)  SpO2 100%  HEMODYNAMICS:    VENTILATOR SETTINGS: Vent Mode:  [-] PRVC FiO2 (%):  [40 %-100 %] 40 % Set Rate:  [18 bmp-30 bmp] 30 bmp Vt Set:  [400 mL] 400 mL PEEP:  [5 cmH20-10 cmH20] 5 cmH20 Pressure Support:  [5 cmH20] 5 cmH20 Plateau Pressure:  [19 cmH20-28 cmH20] 19 cmH20  INTAKE / OUTPUT: I/O last 3 completed shifts: In: 2818.9 [P.O.:20; I.V.:2298.9; IV [id number] Out: 4000 [Urine:4000]  PHYSICAL EXAMINATION: General:  Obese elderly female Neuro:   Alert and interactive, moving all ext to command. HEENT:  [place]/AT, PERRL Cardiovascular:  IRIR, rate controlled, no MRG, trace edema. Lungs:  Coarse BS diffusely. Abdomen:  Soft, non-distended Musculoskeletal: No acute deformity or ROM limitation Skin:  Grossly intact  LABS:  BMET  Recent Labs Lab 11/20/15 0130 11/20/15 1045 11/20/15 2216  NA 133* 132* 136  K 5.2* 6.0* 3.8  CL 108 109 108  CO2 16* 15* 17*  BUN 49* 49* 48*  CREATININE 1.47* 1.75* 1.43*  GLUCOSE 239* 374* 302*   Electrolytes  Recent Labs Lab 11/18/15 1614  11/19/15 0519 11/20/15 0130 11/20/15 1045 11/20/15 2216  CALCIUM 8.9  8.8*  < >  --  9.4 8.7* 9.1  MG 1.4*  --  1.7  --  1.7 1.5*  PHOS 3.7  --   --   --  5.4*  --   < > = values in this interval not displayed.  CBC  Recent Labs Lab 11/20/15 0445 11/20/15 1045 11/21/15 0335  WBC 9.1 10.5 9.2  HGB 10.6* 10.0* 9.2*  HCT 34.1* 32.7* 28.4*  PLT 293 276 240   Coag's No results for input(s): APTT, INR in the last 168 hours.  Sepsis Markers  Recent Labs Lab 11/20/15 0445 11/20/15 1046 11/20/15 1717  LATICACIDVEN 0.8 1.4 1.0   ABG  Recent Labs Lab 11/19/15 2332 11/20/15 1038  PHART 7.198* 7.064*  PCO2ART 42.2 47.3*  PO2ART 72.8* 42.0*   Liver Enzymes  Recent Labs  Lab 11/14/15 2035 11/19/15 0445 11/20/15 1045  AST 19 37 54*  ALT 27 54 57*  ALKPHOS 142* 146* 168*  BILITOT 0.7 0.5 0.5  ALBUMIN 3.3* 1.9* 1.9*   Cardiac Enzymes  Recent Labs Lab 11/18/15 1614 11/18/15 1942 11/20/15 1045  TROPONINI 0.08* 0.07* <0.03   Glucose  Recent Labs Lab 11/20/15 1216 11/20/15 1648 11/20/15 1939 11/21/15 0040 11/21/15 0342 11/21/15 0835  GLUCAP 325* 282* 283* 229* 220* 161*   Imaging Dg Chest 1 View  11/20/2015  CLINICAL DATA:  CHF.  New onset shortness of Breath EXAM: CHEST 1 VIEW COMPARISON:  11/19/2015 FINDINGS: Cardiomegaly. Bilateral perihilar and lower lobe opacities with probable layering effusions, similar to  prior study, most compatible with CHF. No change since prior study. IMPRESSION: Stable CHF pattern.  Probable layering effusions. Electronically Signed   By: Rolm Baptise M.D.   On: 11/20/2015 09:37   Dg Chest Port 1 View  11/20/2015  CLINICAL DATA:  Central line placement EXAM: PORTABLE CHEST 1 VIEW COMPARISON:  11/20/2015 FINDINGS: Cardiomegaly again noted. Atherosclerotic calcifications of thoracic aorta. There is endotracheal tube in place with tip 1.5 cm above the carina. There is left IJ central line. The tip of the central line is obscured by overlying chest wall pad. Persistent congestion/pulmonary edema and bilateral pleural effusion. No pneumothorax. IMPRESSION: Endotracheal tube in place with tip 1.5 cm above the carina. There is left IJ central line. The tip of the central line is obscured by overlying chest wall pad. Persistent congestion/pulmonary edema and bilateral pleural effusion. No pneumothorax. A repeat x-ray is recommended. Electronically Signed   By: Lahoma Crocker M.D.   On: 11/20/2015 10:44   STUDIES:  Echo 4/10 > EF 65-70%  CULTURES: BCx2 4/8 > Coag neg staph in pediatric bottle only Urine 4/8 > multiple species  ANTIBIOTICS: Rocephin 4/8 > Vancomycin 4/8 >  SIGNIFICANT EVENTS: 4/8 admit 4/9 AFRVR to junctional brady. Progressive dyspnea 4/10 respiratory arrest > to ICU > intubated   LINES/TUBES: ETT 4/10 > LIJ CVL 4/10 >  DISCUSSION: 75 year old female with PMH of CAD, and bladder Ca s/p resection. Admitted 4/8 for UTI/AF RVR and developed progressive dyspnea attributed to CHF in setting AF. Then converted to junctional brady. 4/10 suffered respiratory arrest requiring intubation. Plan to support with vent, dopamine. Continue broad spectrum antibiotics.   ASSESSMENT / PLAN:  PULMONARY A: Acute hypoxemic/hypercarbic respiratory failure secondary to pulmonary edema  P:   Full vent support. ARDS protocol. CXR for ETT placement. ABG now. Vent bundle.    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  A-fib with RVR overnight  P:  Telemetry monitoring EP following Dopamine off. Amiodarone drip. Lasix as below. Echo EF 65-70%. ?pacemaker will defer to EP  RENAL A:   AKI > improving Hyperkalemia  P:   Repeat BMP. Bicarb drip. Lasix 40 mg IV q6 x3 doses. Replace electrolytes as indicated.  GASTROINTESTINAL A:   No acute issues  P:   Consult nutrition for TF as per nutrition Protonix for SUP  HEMATOLOGIC A:   Anticoagulation in setting PAF  P:  Continue heparin gtt per pharmacy  INFECTIOUS A:   SIRS/Sepsis UTI Bacteremia vs contamination (coag neg staph in one bottle)  P:   Continue cefepime, vancomycin as above Trend WBC and fever curve. Repeat BC 4/10 results pending.  ENDOCRINE A:   DM  P:   CBG and SSI monitoring  Restart Lantus at 15 BID.  NEUROLOGIC A:   Acute metabolic encephalopathy  P:   RASS goal: -1 to -2 Intermittent sedation   FAMILY  - Updates: Daughter updated over the phone.  - Inter-disciplinary family meet or Palliative Care meeting due by:  4/17  The patient is critically ill with multiple organ systems failure and requires high complexity decision making for assessment and support, frequent evaluation and titration of therapies, application of advanced monitoring technologies and extensive interpretation of multiple databases.   Critical Care Time devoted to patient care services described in this note is  45  Minutes. This time reflects time of care of this signee Dr [name]. This critical care time does not reflect procedure time, or teaching time or supervisory time of PA/NP/Med student/Med Resident etc but could involve care discussion time.  Rush Farmer, M.D. [facility] Pulmonary/Critical Care Medicine. Pager: [phone]. After hours pager: [phone].

## 2015-11-22 ENCOUNTER — Inpatient Hospital Stay (HOSPITAL_COMMUNITY): Payer: PPO

## 2015-11-22 DIAGNOSIS — J9601 Acute respiratory failure with hypoxia: Secondary | ICD-10-CM | POA: Diagnosis not present

## 2015-11-22 DIAGNOSIS — G9341 Metabolic encephalopathy: Secondary | ICD-10-CM

## 2015-11-22 DIAGNOSIS — I480 Paroxysmal atrial fibrillation: Secondary | ICD-10-CM | POA: Diagnosis not present

## 2015-11-22 DIAGNOSIS — N179 Acute kidney failure, unspecified: Secondary | ICD-10-CM | POA: Diagnosis not present

## 2015-11-22 DIAGNOSIS — E1159 Type 2 diabetes mellitus with other circulatory complications: Secondary | ICD-10-CM | POA: Diagnosis not present

## 2015-11-22 DIAGNOSIS — J810 Acute pulmonary edema: Secondary | ICD-10-CM | POA: Diagnosis not present

## 2015-11-22 DIAGNOSIS — Z4682 Encounter for fitting and adjustment of non-vascular catheter: Secondary | ICD-10-CM | POA: Diagnosis not present

## 2015-11-22 DIAGNOSIS — I501 Left ventricular failure: Secondary | ICD-10-CM | POA: Diagnosis not present

## 2015-11-22 DIAGNOSIS — I4891 Unspecified atrial fibrillation: Secondary | ICD-10-CM | POA: Diagnosis not present

## 2015-11-22 LAB — BASIC METABOLIC PANEL WITH GFR
Anion gap: 9 (ref 5–15)
BUN: 35 mg/dL — ABNORMAL HIGH (ref 6–20)
CO2: 23 mmol/L (ref 22–32)
Calcium: 8.9 mg/dL (ref 8.9–10.3)
Chloride: 104 mmol/L (ref 101–111)
Creatinine, Ser: 1.22 mg/dL — ABNORMAL HIGH (ref 0.44–1.00)
GFR calc Af Amer: 49 mL/min — ABNORMAL LOW
GFR calc non Af Amer: 43 mL/min — ABNORMAL LOW
Glucose, Bld: 270 mg/dL — ABNORMAL HIGH (ref 65–99)
Potassium: 4.1 mmol/L (ref 3.5–5.1)
Sodium: 136 mmol/L (ref 135–145)

## 2015-11-22 LAB — GLUCOSE, CAPILLARY
GLUCOSE-CAPILLARY: 254 mg/dL — AB (ref 65–99)
GLUCOSE-CAPILLARY: 228 mg/dL — AB (ref 65–99)
Glucose-Capillary: 260 mg/dL — ABNORMAL HIGH (ref 65–99)
Glucose-Capillary: 271 mg/dL — ABNORMAL HIGH (ref 65–99)
Glucose-Capillary: 203 mg/dL — ABNORMAL HIGH (ref 65–99)

## 2015-11-22 LAB — VANCOMYCIN, TROUGH: Vancomycin Tr: 14 ug/mL (ref 10.0–20.0)

## 2015-11-22 LAB — CBC
HEMATOCRIT: 28.3 % — AB (ref 36.0–46.0)
Hemoglobin: 9.1 g/dL — ABNORMAL LOW (ref 12.0–15.0)
MCH: 25.9 pg — AB (ref 26.0–34.0)
MCHC: 32.2 g/dL (ref 30.0–36.0)
MCV: 80.4 fL (ref 78.0–100.0)
PLATELETS: 253 10*3/uL (ref 150–400)
RBC: 3.52 MIL/uL — ABNORMAL LOW (ref 3.87–5.11)
RDW: 14.9 % (ref 11.5–15.5)
WBC: 9 10*3/uL (ref 4.0–10.5)

## 2015-11-22 LAB — PHOSPHORUS: Phosphorus: 2 mg/dL — ABNORMAL LOW (ref 2.5–4.6)

## 2015-11-22 LAB — BLOOD GAS, ARTERIAL
Acid-Base Excess: 0.2 mmol/L (ref 0.0–2.0)
Bicarbonate: 23.5 meq/L (ref 20.0–24.0)
Drawn by: 41308
FIO2: 0.4
MECHVT: 400 mL
O2 Saturation: 98.4 %
PEEP: 5 cmH2O
Patient temperature: 98.6
RATE: 20 {breaths}/min
TCO2: 24.5 mmol/L (ref 0–100)
pCO2 arterial: 33.2 mmHg — ABNORMAL LOW (ref 35.0–45.0)
pH, Arterial: 7.464 — ABNORMAL HIGH (ref 7.350–7.450)
pO2, Arterial: 116 mmHg — ABNORMAL HIGH (ref 80.0–100.0)

## 2015-11-22 LAB — HEPARIN LEVEL (UNFRACTIONATED): Heparin Unfractionated: 0.38 IU/mL (ref 0.30–0.70)

## 2015-11-22 LAB — MAGNESIUM: Magnesium: 1.6 mg/dL — ABNORMAL LOW (ref 1.7–2.4)

## 2015-11-22 MED ORDER — FUROSEMIDE 10 MG/ML IJ SOLN
40.0000 mg | Freq: Two times a day (BID) | INTRAMUSCULAR | Status: DC
  Administered 2015-11-22: 40 mg via INTRAVENOUS
  Filled 2015-11-22: qty 4

## 2015-11-22 MED ORDER — FENTANYL CITRATE (PF) 100 MCG/2ML IJ SOLN
25.0000 ug | Freq: Once | INTRAMUSCULAR | Status: AC
  Administered 2015-11-22: 25 ug via INTRAVENOUS
  Filled 2015-11-22: qty 2

## 2015-11-22 MED ORDER — MAGNESIUM SULFATE 2 GM/50ML IV SOLN
2.0000 g | Freq: Once | INTRAVENOUS | Status: AC
  Administered 2015-11-22: 2 g via INTRAVENOUS
  Filled 2015-11-22: qty 50

## 2015-11-22 MED ORDER — INSULIN GLARGINE 100 UNIT/ML ~~LOC~~ SOLN
20.0000 [IU] | Freq: Two times a day (BID) | SUBCUTANEOUS | Status: DC
  Administered 2015-11-22 – 2015-11-27 (×11): 20 [IU] via SUBCUTANEOUS
  Filled 2015-11-22 (×14): qty 0.2

## 2015-11-22 MED ORDER — FENTANYL CITRATE (PF) 100 MCG/2ML IJ SOLN: 25.0000 ug | Freq: Once | INTRAMUSCULAR | Status: DC

## 2015-11-22 MED ORDER — SODIUM PHOSPHATE 3 MMOLE/ML IV SOLN
30.0000 mmol | Freq: Once | INTRAVENOUS | Status: AC
  Administered 2015-11-22: 30 mmol via INTRAVENOUS
  Filled 2015-11-22: qty 10

## 2015-11-22 MED ORDER — RACEPINEPHRINE HCL 2.25 % IN NEBU
0.5000 mL | INHALATION_SOLUTION | Freq: Once | RESPIRATORY_TRACT | Status: AC
  Administered 2015-11-22: 0.5 mL via RESPIRATORY_TRACT
  Filled 2015-11-22: qty 0.5

## 2015-11-22 MED ORDER — AMIODARONE IV BOLUS ONLY 150 MG/100ML
150.0000 mg | Freq: Once | INTRAVENOUS | Status: AC
  Administered 2015-11-23: 150 mg via INTRAVENOUS
  Filled 2015-11-22: qty 100

## 2015-11-22 MED ORDER — FUROSEMIDE 10 MG/ML IJ SOLN
40.0000 mg | Freq: Three times a day (TID) | INTRAMUSCULAR | Status: AC
  Administered 2015-11-22: 40 mg via INTRAVENOUS
  Filled 2015-11-22: qty 4

## 2015-11-22 MED ORDER — DEXTROSE 5 % IV SOLN
2.0000 g | INTRAVENOUS | Status: DC
  Administered 2015-11-22: 2 g via INTRAVENOUS
  Filled 2015-11-22 (×2): qty 2

## 2015-11-22 MED ORDER — RACEPINEPHRINE HCL 2.25 % IN NEBU
0.5000 mL | INHALATION_SOLUTION | RESPIRATORY_TRACT | Status: DC
  Filled 2015-11-22: qty 0.5

## 2015-11-22 MED ORDER — AMIODARONE IV BOLUS ONLY 150 MG/100ML
150.0000 mg | Freq: Once | INTRAVENOUS | Status: AC
  Administered 2015-11-22: 150 mg via INTRAVENOUS

## 2015-11-22 MED ORDER — POTASSIUM PHOSPHATES 15 MMOLE/5ML IV SOLN
30.0000 mmol | Freq: Once | INTRAVENOUS | Status: DC
  Filled 2015-11-22: qty 10

## 2015-11-22 NOTE — Progress Notes (Signed)
PULMONARY / CRITICAL CARE MEDICINE   Name: [name] MRN: [id number] DOB: June 06, 1941    ADMISSION DATE:  11/18/2015 CONSULTATION DATE:  11/20/2014  REFERRING MD:  Wendee Beavers  CHIEF COMPLAINT:  AMS  HISTORY OF PRESENT ILLNESS:   75 year old female with PMH as below, which includes bladder ca s/p resection now with chronic urostomy, PVD s/p L fem/tib bypass, and r BKA. Also with AF, CAD and MI in 2014, and DM. She was seen in ED several times this month for chest and neck pain which was treated as musculoskeletal pain. She was also recently prescribed keflex for UTI. She the presented again to the ED 4/8 with complaints of altered mental status and had reportedly not been taking her keflex or insulin. At time of admission she was having hallucinations and was only oriented to herself. She was admitted to the hospitalist team to be managed for HHNK, AKI, AF RVR, and UTI/SIRS. 4/9 AF persisted and she developed progressive dyspnea. 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 Started on dopamine 4/10 SOB worsened and CXR was consistent pulmonary edema and CHF. She was also lethargic and rapid response was called. The patient eventually required ICU transfer and intubation.   SUBJECTIVE:  No events overnight, much more alert and interactive.  VITAL SIGNS: BP 102/55 mmHg  Pulse 81  Temp(Src) 98.3 F (36.8 C) (Oral)  Resp 20  Wt 97.9 kg (215 lb 13.3 oz)  SpO2 100%  HEMODYNAMICS: CVP:  [7 mmHg-11 mmHg] 11 mmHg  VENTILATOR SETTINGS: Vent Mode:  [-] PRVC FiO2 (%):  [40 %] 40 % Set Rate:  [20 bmp] 20 bmp Vt Set:  [400 mL] 400 mL PEEP:  [5 cmH20] 5 cmH20 Plateau Pressure:  [15 cmH20-22 cmH20] 19 cmH20  INTAKE / OUTPUT: I/O last 3 completed shifts: In: 4728.1 [I.V.:3458.9; NG/GT:769.2; IV Piggyback:500] Out: [id number] [Urine:5925]  PHYSICAL EXAMINATION: General:  Obese elderly female, NAD, arousable and following  commands. Neuro:  Alert and interactive, moving all ext to command. HEENT:  [place]/AT, PERRL Cardiovascular:  IRIR, rate controlled, no MRG, trace edema. Lungs:  Coarse BS diffusely. Abdomen:  Soft, non-distended Musculoskeletal: No acute deformity or ROM limitation Skin:  Grossly intact  LABS:  BMET  Recent Labs Lab 11/20/15 2216 11/21/15 1244 11/22/15 0320  NA 136 136 136  K 3.8 3.4* 4.1  CL 108 103 104  CO2 17* 21* 23  BUN 48* 40* 35*  CREATININE 1.43* 1.35* 1.22*  GLUCOSE 302* 184* 270*   Electrolytes  Recent Labs Lab 11/20/15 1045 11/20/15 2216 11/21/15 1244 11/22/15 0320  CALCIUM 8.7* 9.1 9.0 8.9  MG 1.7 1.5* 1.3* 1.6*  PHOS 5.4*  --  2.0* 2.0*   CBC  Recent Labs Lab 11/20/15 1045 11/21/15 0335 11/22/15 0320  WBC 10.5 9.2 9.0  HGB 10.0* 9.2* 9.1*  HCT 32.7* 28.4* 28.3*  PLT 276 240 253   Coag's No results for input(s): APTT, INR in the last 168 hours.  Sepsis Markers  Recent Labs Lab 11/20/15 0445 11/20/15 1046 11/20/15 1717  LATICACIDVEN 0.8 1.4 1.0   ABG  Recent Labs Lab 11/20/15 1038 11/21/15 1006 11/22/15 0345  PHART 7.064* 7.482* 7.464*  PCO2ART 47.3* 25.4* 33.2*  PO2ART 42.0* 53.0* 116*   Liver Enzymes  Recent Labs Lab 11/19/15 0445 11/20/15 1045  AST 37 54*  ALT 54 57*  ALKPHOS 146* 168*  BILITOT 0.5 0.5  ALBUMIN 1.9* 1.9*  Cardiac Enzymes  Recent Labs Lab 11/18/15 1614 11/18/15 1942 11/20/15 1045  TROPONINI 0.08* 0.07* <0.03   Glucose  Recent Labs Lab 11/21/15 1155 11/21/15 1620 11/21/15 1953 11/21/15 2324 11/22/15 0324 11/22/15 0759  GLUCAP 171* 153* 191* 198* 254* 260*   Imaging Dg Chest Port 1 View  11/22/2015  CLINICAL DATA:  Intubation. EXAM: PORTABLE CHEST 1 VIEW COMPARISON:  11/21/2015. FINDINGS: Endotracheal tube, NG tube, left IJ line in stable position. Cardiomegaly with pulmonary vascular prominence and bilateral basilar infiltrates/edema and bilateral pleural effusions again noted.  Findings consistent congestive heart failure. No pneumothorax. IMPRESSION: 1. Lines and tubes in stable position. 2. Persistent changes of congestive heart failure bibasilar pulmonary edema and bilateral pleural effusions. Similar findings noted on prior exam. Bilateral pneumonia cannot be excluded . Electronically Signed   By: Marcello Moores  Register   On: 11/22/2015 07:34   Dg Chest Port 1 View  11/21/2015  CLINICAL DATA:  Intubation. EXAM: PORTABLE CHEST 1 VIEW COMPARISON:  11/20/2015.  11/19/2015. FINDINGS: Endotracheal tube, left IJ line in stable position. OG tube noted with tip below left hemidiaphragm. Persistent cardiomegaly. Persistent bilateral pulmonary alveolar infiltrates and pleural effusions suggesting congestive heart failure. Bilateral lower lobe pneumonia cannot be excluded. No pneumothorax. IMPRESSION: 1. Endotracheal tube and left IJ line in stable position. NG tube noted with tip below left hemidiaphragm. 2. Persistent cardiomegaly with prominent bilateral lower lobe infiltrates and bilateral pleural effusions again noted. Findings suggest congestive heart failure. Bilateral pneumonia cannot be excluded. Electronically Signed   By: Marcello Moores  Register   On: 11/21/2015 11:50   STUDIES:  Echo 4/10 > EF 65-70%  CULTURES: BCx2 4/8 > Coag neg staph in pediatric bottle only Urine 4/8 > multiple species  ANTIBIOTICS: Rocephin 4/8 > Vancomycin 4/8 >  SIGNIFICANT EVENTS: 4/8 admit 4/9 AFRVR to junctional brady. Progressive dyspnea 4/10 respiratory arrest > to ICU > intubated   LINES/TUBES: ETT 4/10 > LIJ CVL 4/10 >  DISCUSSION: 75 year old female with PMH of CAD, and bladder Ca s/p resection. Admitted 4/8 for UTI/AF RVR and developed progressive dyspnea attributed to CHF in setting AF. Then converted to junctional brady. 4/10 suffered respiratory arrest requiring intubation. Plan to support with vent, dopamine. Continue broad spectrum antibiotics.   ASSESSMENT /  PLAN:  PULMONARY A: Acute hypoxemic/hypercarbic respiratory failure secondary to pulmonary edema  P:   Extubate. Titrate O2 for sat of 88-92%. IS per RT protocol. Ambulate.  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  A-fib with RVR overnight  P:  Telemetry monitoring. EP following. D/C Dopamine. Amiodarone drip. Lasix as below. Echo EF 65-70%. ?pacemaker will defer to EP  RENAL A:   AKI > improving Hyperkalemia  P:   Repeat BMP. D/C Bicarb drip. Lasix 40 mg IV q8 x2 doses. Replace electrolytes as indicated.  GASTROINTESTINAL A:   No acute issues  P:   Swallow evaluation. Protonix for SUP.  HEMATOLOGIC A:   Anticoagulation in setting PAF  P:  Continue heparin gtt per pharmacy  INFECTIOUS A:   SIRS/Sepsis UTI Bacteremia vs contamination (coag neg staph in one bottle)  P:   Continue cefepime, vancomycin as above Trend WBC and fever curve. Repeat BC 4/10 results pending.  ENDOCRINE A:   DM  P:   CBG and SSI monitoring Restart Lantus at 15 BID.  NEUROLOGIC A:   Acute metabolic encephalopathy  P:   RASS goal: -1 to -2 Intermittent  sedation  FAMILY  - Updates: No family bedside, patient updated.  - Inter-disciplinary family meet or Palliative Care meeting due by:  4/17.  The patient is critically ill with multiple organ systems failure and requires high complexity decision making for assessment and support, frequent evaluation and titration of therapies, application of advanced monitoring technologies and extensive interpretation of multiple databases.   Critical Care Time devoted to patient care services described in this note is  35  Minutes. This time reflects time of care of this signee Dr [name]. This critical care time does not reflect procedure time, or teaching time or supervisory time of PA/NP/Med student/Med Resident etc but could  involve care discussion time.  Rush Farmer, M.D. [facility] Pulmonary/Critical Care Medicine. Pager: [phone]. After hours pager: [phone].

## 2015-11-22 NOTE — Progress Notes (Signed)
SUBJECTIVE: The patient is remains intubated, awake  . antiseptic oral rinse  7 mL Mouth Rinse 10 times per day  . aspirin  81 mg Oral Daily  . atorvastatin  80 mg Oral q1800  . ceFEPime (MAXIPIME) IV  1 g Intravenous Q24H  . chlorhexidine gluconate (SAGE KIT)  15 mL Mouth Rinse BID  . feeding supplement (VITAL HIGH PROTEIN)  1,000 mL Per Tube Q24H  . fentaNYL (SUBLIMAZE) injection  50 mcg Intravenous Once  . insulin aspart  0-20 Units Subcutaneous Q4H  . insulin glargine  15 Units Subcutaneous BID  . off the beat book   Does not apply Once  . pantoprazole (PROTONIX) IV  40 mg Intravenous Q24H  . sodium chloride flush  10-40 mL Intracatheter Q12H  . sodium chloride flush  3 mL Intravenous Q12H  . vancomycin  1,000 mg Intravenous Q24H   . sodium chloride Stopped (11/21/15 1239)  . amiodarone 30 mg/hr (11/22/15 0625)  . DOPamine Stopped (11/20/15 1415)  . fentaNYL infusion INTRAVENOUS 75 mcg/hr (11/21/15 2346)  . heparin 1,300 Units/hr (11/21/15 2000)  . phenylephrine (NEO-SYNEPHRINE) Adult infusion Stopped (11/21/15 2217)  .  sodium bicarbonate  infusion 1000 mL 50 mL/hr at 11/22/15 0404    OBJECTIVE: Physical Exam: Filed Vitals:   11/22/15 0545 11/22/15 0600 11/22/15 0615 11/22/15 0630  BP:  116/77  138/76  Pulse: 115 116 114 116  Temp:      TempSrc:      Resp: '20 20 20 20  '$ Weight:      SpO2: 100% 100% 100% 100%    Intake/Output Summary (Last 24 hours) at 11/22/15 0656 Last data filed at 11/22/15 0600  Gross per 24 hour  Intake 3268.76 ml  Output   5050 ml  Net -1781.24 ml    Telemetry reveals AFib 100-115 generally  GEN- The patient remains intubated, awake and asks questions via a white board Head- normocephalic, atraumatic Eyes-  Sclera clear, conjunctiva pink Ears- hearing intact Lungs- intubated, coarse BS b/l, anteriorly Heart- irregular rate/rhythm, no significant murmurs, no rubs or gallops GI- soft, NT, ND Extremities- +2 edema RLE, L BKA, no  cyanosis, or edema Skin- no rash or lesion Psych- euthymic mood, full affect Neuro- no gross deficits appreciated  LABS: Basic Metabolic Panel:  Recent Labs  11/21/15 1244 11/22/15 0320  NA 136 136  K 3.4* 4.1  CL 103 104  CO2 21* 23  GLUCOSE 184* 270*  BUN 40* 35*  CREATININE 1.35* 1.22*  CALCIUM 9.0 8.9  MG 1.3* 1.6*  PHOS 2.0* 2.0*   Liver Function Tests:  Recent Labs  11/20/15 1045  AST 54*  ALT 57*  ALKPHOS 168*  BILITOT 0.5  PROT 5.7*  ALBUMIN 1.9*   CBC:  Recent Labs  11/20/15 1045 11/21/15 0335 11/22/15 0320  WBC 10.5 9.2 9.0  NEUTROABS 9.4*  --   --   HGB 10.0* 9.2* 9.1*  HCT 32.7* 28.4* 28.3*  MCV 84.5 79.1 80.4  PLT 276 240 253   Cardiac Enzymes:  Recent Labs  11/20/15 1045  TROPONINI <0.03   Hemoglobin A1C: No results for input(s): HGBA1C in the last 72 hours. Thyroid Function Tests: No results for input(s): TSH, T4TOTAL, T3FREE, THYROIDAB in the last 72 hours.  Invalid input(s): FREET3  RADIOLOGY: Dg Chest 1 View 11/20/2015  CLINICAL DATA:  CHF.  New onset shortness of Breath EXAM: CHEST 1 VIEW COMPARISON:  11/19/2015 FINDINGS: Cardiomegaly. Bilateral perihilar and lower lobe opacities with probable layering  effusions, similar to prior study, most compatible with CHF. No change since prior study. IMPRESSION: Stable CHF pattern.  Probable layering effusions. Electronically Signed   By: Rolm Baptise M.D.   On: 11/20/2015 09:37    Dg Chest Port 1 View 11/20/2015  CLINICAL DATA:  Central line placement EXAM: PORTABLE CHEST 1 VIEW COMPARISON:  11/20/2015 FINDINGS: Cardiomegaly again noted. Atherosclerotic calcifications of thoracic aorta. There is endotracheal tube in place with tip 1.5 cm above the carina. There is left IJ central line. The tip of the central line is obscured by overlying chest wall pad. Persistent congestion/pulmonary edema and bilateral pleural effusion. No pneumothorax. IMPRESSION: Endotracheal tube in place with tip 1.5  cm above the carina. There is left IJ central line. The tip of the central line is obscured by overlying chest wall pad. Persistent congestion/pulmonary edema and bilateral pleural effusion. No pneumothorax. A repeat x-ray is recommended. Electronically Signed   By: Lahoma Crocker M.D.   On: 11/20/2015 10:44    ASSESSMENT AND PLAN:  1.  New PAFib, Tachy-brady, suspect secondary to acute/ongoing medical issues, metabolic derangement.  Continue  amiodarone gtt       CHA2DS2Vasc is at least 4 on heparin gtt (pharmacy managing)      BP stable      CBC/plts stable  2. Respiratory failure      Remains intubated  remains fluid + overall, but neg -1877 ml yesterday      Continue IV diuresis with CCM  3. CAD  No reports of CP  4. UTI  Urostomy tube  Hx of bladder cancer  mgmt with IM service  5. Encephalopathy   Improving  6. Hyperkalemia, CRI  Continue lytes with IM and critical care  7. DM OOC     Remains on insulin   Tommye Standard, PA-C 11/22/2015 6:56 AM     I have seen and examined this patient with Tommye Standard.  Agree with above, note added to reflect my findings.  On exam, tachycardic, irregular, no murmurs, lungs course diffisely.  Went back into AF and has been in for the last 36 or so hours.  On heparin and would continue heparin GGT if possible.  Was started on amiodarone GGT and would also continue that for rate and possibly rhythm control.  She remains volume overloaded today.  Has been diuresing well.  Would plan to continue diuresis.  It is likely that as her volume status returns close to normal, her AF Jerren Flinchbaugh be easier to control.      Kaitlynn Tramontana M. Uriyah Massimo MD 11/22/2015 9:40 AM

## 2015-11-22 NOTE — Progress Notes (Signed)
This RN called main pharmacy to f/u on [facility]. New supply not in yet. Pharmacist to call MD to change to [facility].

## 2015-11-22 NOTE — Progress Notes (Signed)
125cc of Fent wasted in the sink with San Jetty RN and Coletta Memos RN.

## 2015-11-22 NOTE — Progress Notes (Signed)
[place] for Heparin Indication: atrial fibrillation  Allergies  Allergen Reactions  . Gabapentin Other (See Comments)    Extremely drunk and lethargic  . Lisinopril Other (See Comments)    Increased creatinine levels (pt currently taking low dose)  . Metformin And Related Other (See Comments)    Increased creatinine levels  . Biaxin [Clarithromycin] Itching    Patient Measurements: Height/Weight from 11/14/2015 Ht. 5'2" Wt. 89.8kg Heparin Dosing Weight: 70.8kg  Vital Signs: Temp: 98.3 F (36.8 C) (04/12 0800) Temp Source: Oral (04/12 0800) BP: 136/55 mmHg (04/12 0800) Pulse Rate: 133 (04/12 0800)  Labs:  Recent Labs  11/20/15 0445 11/20/15 1045 11/20/15 2216 11/21/15 0335 11/21/15 1244 11/22/15 0320 11/22/15 0331  HGB 10.6* 10.0*  --  9.2*  --  9.1*  --   HCT 34.1* 32.7*  --  28.4*  --  28.3*  --   PLT 293 276  --  240  --  253  --   HEPARINUNFRC 0.57  --   --  0.37  --   --  0.38  CREATININE  --  1.75* 1.43*  --  1.35* 1.22*  --   TROPONINI  --  <0.03  --   --   --   --   --    Estimated Creatinine Clearance: 44.2 mL/min (by C-G formula based on Cr of 1.22).  Medical History: Past Medical History  Diagnosis Date  . Hypertension   . Hyperlipidemia   . History of atrial tachycardia   . Peripheral vascular disease (HCC) S/P RIGHT BKA  . Peripheral neuropathy (HCC) LEGS AND HANDS  . 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 GERD (gastroesophageal reflux disease)   . Anemia   . Blood transfusion   . Macular degeneration of both eyes   . Glaucoma   . Retinopathy due to secondary diabetes mellitus ([place])   . History of bladder cancer TCC AND CIS  . Myocardial infarction ([place])   . Presence of urostomy ([place])   . Type 2 diabetes mellitus with vascular disease ([place])   . CKD (chronic kidney disease) stage 3, GFR 30-59 ml/min 01/26/2013  . Obesity (BMI 30-39.9)   . CAD (coronary artery disease) 01/26/2013   Evidently circumflex stent in 2014 following bladder cancer surgery for old myocardial infarction.    Assessment: This patient is known to pharmacy from past heparin therapy (2014).  She presents with atrial fibrillation and we have been asked to initiate IV heparin for her.  She required higher than calculated dose using adjusted body weight in the past.    Heparin level at goal, CBC stable  Goal of Therapy:  Heparin level 0.3-0.7 units/ml Monitor platelets by anticoagulation protocol: Yes   Plan:  Continue Heparin infusion at 1300 units / hr Daily Heparin level, CBC  Thank you Anette Guarneri, PharmD [phone]   11/22/2015 9:14 AM

## 2015-11-22 NOTE — Progress Notes (Signed)
[place] Progress Note Patient Name: [name] DOB: 1940-09-24 MRN: [id number]   Date of Service  11/22/2015  HPI/Events of Note  Multiple issues: 1. Patient c/o headache, NPO and refuses Tylenol Suppository. 2. AFIB with RVR - HR 120's to 140's.  eICU Interventions  Will order: 1. Fentanyl 25 mcg IV X 1 now. 2. Amiodarone 150 mg IV now. 3. BMP and Mg++ level now.      Intervention Category Major Interventions: Arrhythmia - evaluation and management Intermediate Interventions: Pain - evaluation and management  Mckinnley Smithey Eugene 11/22/2015, 11:54 PM

## 2015-11-22 NOTE — Progress Notes (Signed)
Inpatient Diabetes Program Recommendations  AACE/ADA: New Consensus Statement on Inpatient Glycemic Control (2015)  Target Ranges:  Prepandial:   less than 140 mg/dL      Peak postprandial:   less than 180 mg/dL (1-2 hours)      Critically ill patients:  140 - 180 mg/dL   Review of Glycemic Control:  Results for BRITANNY, BRISTOW (MRN [id number]) as of 11/22/2015 09:19  Ref. Range 11/21/2015 19:53 11/21/2015 23:24 11/22/2015 03:24 11/22/2015 07:59  Glucose-Capillary Latest Ref Range: 65-99 mg/dL 191 (H) 198 (H) 254 (H) 260 (H)   Home meds:  Lantus 55 units q AM and 60 units q PM, Humalog 20-50 units daily as needed for high blood sugar.   Inpatient Diabetes Program Recommendations:    Note that CBG's increased.  May consider increasing Lantus to 1/2 of home dose.  Consider Lantus 30 units bid.   Thanks, Adah Perl, RN, BC-ADM Inpatient Diabetes Coordinator Pager [phone] (8a-5p)

## 2015-11-22 NOTE — Progress Notes (Signed)
Pt extubated at [place]. Hr increased to 140-150 AF. Dr. Nelda Marseille gave verbal orders to give 150mg  amio bolus. Pt also received Racepi neb for resp status. No change in HR observed after bolus, and time given for Racepi to wear off. Tommye Standard PA with EP paged at ~1200 to make aware HR 130-140.Marland KitchenShe discussed with Dr. Curt Bears. No new orders given at this time due to pt's stable BP. Will make aware if pt becomes unstable. Will continue to monitor and assess pt closely.

## 2015-11-22 NOTE — Consult Note (Signed)
PULMONARY / CRITICAL CARE MEDICINE   Name: [name] MRN: [id number] DOB: 06-Dec-1940    ADMISSION DATE:  11/18/2015 CONSULTATION DATE:  11/20/2014  REFERRING MD:  Wendee Beavers  CHIEF COMPLAINT:  AMS  HISTORY OF PRESENT ILLNESS:   75 year old female with PMH as below, which includes bladder ca s/p resection now with chronic urostomy, PVD s/p L fem/tib bypass, and r BKA. Also with AF, CAD and MI in 2014, and DM. She was seen in ED several times this month for chest and neck pain which was treated as musculoskeletal pain. She was also recently prescribed keflex for UTI. She the presented again to the ED 4/8 with complaints of altered mental status and had reportedly not been taking her keflex or insulin. At time of admission she was having hallucinations and was only oriented to herself. She was admitted to the hospitalist team to be managed for HHNK, AKI, AF RVR, and UTI/SIRS. 4/9 AF persisted and she developed progressive dyspnea. 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 Started on dopamine 4/10 SOB worsened and CXR was consistent pulmonary edema and CHF. She was also lethargic and rapid response was called. The patient eventually required ICU transfer and intubation.   SUBJECTIVE:  No complaints today. Overnight events- electrolyte abnormalities, Mag repleted.    VITAL SIGNS: BP 138/76 mmHg  Pulse 133  Temp(Src) 98.3 F (36.8 C) (Oral)  Resp 20  Wt 215 lb 13.3 oz (97.9 kg)  SpO2 98%  HEMODYNAMICS: CVP:  [7 mmHg-11 mmHg] 11 mmHg- 11/22/15  VENTILATOR SETTINGS: Vent Mode:  [-] PRVC FiO2 (%):  [40 %] 40 % Set Rate:  [20 bmp] 20 bmp Vt Set:  [400 mL] 400 mL PEEP:  [5 cmH20] 5 cmH20 Plateau Pressure:  [15 cmH20-22 cmH20] 19 cmH20  INTAKE / OUTPUT: I/O last 3 completed shifts: In: 4728.1 [I.V.:3458.9; NG/GT:769.2; IV Piggyback:500] Out: [id number] [Urine:5925]  Net positive since admission- 8.6L  PHYSICAL  EXAMINATION: General:  Obese elderly female, not in any distress Neuro:  Alert and interactive, following commands. HEENT:  [place]/AT, PERRL Cardiovascular:  Tachycradic, irregular, no MRG, trace edema. Lungs:  Coarse BS diffusely. Abdomen:  Soft, not-distended Musculoskeletal: No acute deformity or ROM limitation Skin:  Grossly intact  LABS:  BMET  Recent Labs Lab 11/20/15 2216 11/21/15 1244 11/22/15 0320  NA 136 136 136  K 3.8 3.4* 4.1  CL 108 103 104  CO2 17* 21* 23  BUN 48* 40* 35*  CREATININE 1.43* 1.35* 1.22*  GLUCOSE 302* 184* 270*   Electrolytes  Recent Labs Lab 11/20/15 1045 11/20/15 2216 11/21/15 1244 11/22/15 0320  CALCIUM 8.7* 9.1 9.0 8.9  MG 1.7 1.5* 1.3* 1.6*  PHOS 5.4*  --  2.0* 2.0*    CBC  Recent Labs Lab 11/20/15 1045 11/21/15 0335 11/22/15 0320  WBC 10.5 9.2 9.0  HGB 10.0* 9.2* 9.1*  HCT 32.7* 28.4* 28.3*  PLT 276 240 253   Coag's No results for input(s): APTT, INR in the last 168 hours.  Sepsis Markers  Recent Labs Lab 11/20/15 0445 11/20/15 1046 11/20/15 1717  LATICACIDVEN 0.8 1.4 1.0   ABG  Recent Labs Lab 11/20/15 1038 11/21/15 1006 11/22/15 0345  PHART 7.064* 7.482* 7.464*  PCO2ART 47.3* 25.4* 33.2*  PO2ART 42.0* 53.0* 116*   Liver Enzymes  Recent Labs Lab 11/19/15 0445 11/20/15 1045  AST 37 54*  ALT 54 57*  ALKPHOS 146* 168*  BILITOT 0.5 0.5  ALBUMIN 1.9* 1.9*   Cardiac Enzymes  Recent Labs Lab 11/18/15 1614 11/18/15 1942 11/20/15 1045  TROPONINI 0.08* 0.07* <0.03   Glucose  Recent Labs Lab 11/21/15 0835 11/21/15 1155 11/21/15 1620 11/21/15 1953 11/21/15 2324 11/22/15 0324  GLUCAP 161* 171* 153* 191* 198* 254*   Imaging Dg Chest Port 1 View  11/22/2015  CLINICAL DATA:  Intubation. EXAM: PORTABLE CHEST 1 VIEW COMPARISON:  11/21/2015. FINDINGS: Endotracheal tube, NG tube, left IJ line in stable position. Cardiomegaly with pulmonary vascular prominence and bilateral basilar  infiltrates/edema and bilateral pleural effusions again noted. Findings consistent congestive heart failure. No pneumothorax. IMPRESSION: 1. Lines and tubes in stable position. 2. Persistent changes of congestive heart failure bibasilar pulmonary edema and bilateral pleural effusions. Similar findings noted on prior exam. Bilateral pneumonia cannot be excluded . Electronically Signed   By: Marcello Moores  Register   On: 11/22/2015 07:34   Dg Chest Port 1 View  11/21/2015  CLINICAL DATA:  Intubation. EXAM: PORTABLE CHEST 1 VIEW COMPARISON:  11/20/2015.  11/19/2015. FINDINGS: Endotracheal tube, left IJ line in stable position. OG tube noted with tip below left hemidiaphragm. Persistent cardiomegaly. Persistent bilateral pulmonary alveolar infiltrates and pleural effusions suggesting congestive heart failure. Bilateral lower lobe pneumonia cannot be excluded. No pneumothorax. IMPRESSION: 1. Endotracheal tube and left IJ line in stable position. NG tube noted with tip below left hemidiaphragm. 2. Persistent cardiomegaly with prominent bilateral lower lobe infiltrates and bilateral pleural effusions again noted. Findings suggest congestive heart failure. Bilateral pneumonia cannot be excluded. Electronically Signed   By: Marcello Moores  Register   On: 11/21/2015 11:50   STUDIES:  Echo 4/10 > EF 65-70%, no comment of diastholic dd or RWMA.  CULTURES: BCx2 4/8 > Coag neg staph in pediatric bottle only Urine 4/8 > multiple species  ANTIBIOTICS: Rocephin 4/8 >4/10 Vancomycin 4/8 > Cefepime 4/10 >>  SIGNIFICANT EVENTS: 4/8 admit 4/9 AFRVR to junctional brady. Progressive dyspnea 4/10 respiratory arrest > to ICU > intubated  4/10 Dopamine>>4/10  LINES/TUBES: ETT 4/10 > LIJ CVL 4/10 >  DISCUSSION: 75 year old female with PMH of CAD, and bladder Ca s/p resection. Admitted 4/8 for UTI/AF RVR and developed progressive dyspnea attributed to CHF in setting AF. Then converted to junctional brady. 4/10 suffered respiratory  arrest requiring intubation. Plan to support with vent, dopamine. Continue broad spectrum antibiotics.   ASSESSMENT / PLAN:  PULMONARY A: Acute hypoxemic/hypercarbic respiratory failure secondary to pulmonary edema P:   Full vent support. ARDS protocol. CXR for ETT placement. ABG with respiratory alkalosis- reduce RR Vent bundle.   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  A-fib with RVR - now controlled Increased CVP- 11 P:  Telemetry monitoring EP following Dopamine off. Cont Amiodarone drip. Restart Lasix 40 IV BID Echo EF 65-70%. ?pacemaker will defer to EP  RENAL A:   AKI > improving Hyperkalemia P:   Repeat BMP. Bicarb drip- Can D/c Cont Lasix 40 mg IV  BID Replace electrolytes as indicated.  GASTROINTESTINAL A:   No acute issues P:   Consult nutrition for TF as per nutrition Protonix for SUP  HEMATOLOGIC A:   Anticoagulation in setting PAF Anemia- mild down trend P:  Continue heparin gtt per pharmacy  INFECTIOUS A:   SIRS/Sepsis UTI Bacteremia vs contamination (coag neg staph in one bottle) P:   Continue cefepime, vancomycin as above Cont Trend WBC and fever curve. Repeat BC 4/10 results pending. No  fever, no WBC, can consider d/c antibiotics.   ENDOCRINE A:   DM P:   CBG and SSI monitoring Increase Lantus to 20 BID.  NEUROLOGIC A:   Acute metabolic encephalopathy- improved  P:   RASS goal: -1 to -2 Intermittent sedation- PRN fentanyl and versed.   FAMILY  - Updates: Daughter updated over the phone.  - Inter-disciplinary family meet or Palliative Care meeting due by:  4/17  Sande Brothers, MD IMTS 8:41 AM 11/22/[phone] [phone]  Rush Farmer, M.D. [facility] Pulmonary/Critical Care Medicine. Pager: [phone]. After hours pager: [phone].

## 2015-11-22 NOTE — Progress Notes (Signed)
[place] Progress Note Patient Name: [name] DOB: May 08, 1941 MRN: [id number]   Date of Service  11/22/2015  HPI/Events of Note  Mg++ = 1.6 and Creatinine = 1.22.  eICU Interventions  Will replete Mg++.     Intervention Category Intermediate Interventions: Electrolyte abnormality - evaluation and management  Sommer,Steven Eugene 11/22/2015, 4:25 AM

## 2015-11-22 NOTE — Progress Notes (Addendum)
Pharmacy notified for missing dose of K Phos. Pharmacist called to inform this RN they are waiting for supply to be delivered. It will sent up when they received shipment.

## 2015-11-22 NOTE — Procedures (Signed)
Extubation Procedure Note  Patient Details:   Name: [name] DOB: 05/03/1941 MRN: [id number]   Airway Documentation:  Airway 7.5 mm (Active)  Secured at (cm) 25 cm 11/22/2015  7:23 AM  Measured From Lips 11/22/2015  7:23 AM  Secured Location Left 11/22/2015  7:23 AM  Secured By [employer] 11/22/2015  7:23 AM  Tube Holder Repositioned Yes 11/22/2015  7:23 AM  Cuff Pressure (cm H2O) 22 cm H2O 11/21/2015  4:20 PM  Site Condition Dry 11/22/2015  7:23 AM    Evaluation  O2 sats: stable throughout Complications: No apparent complications Patient did tolerate procedure well. Bilateral Breath Sounds: Diminished   Yes  PT was extubated to  3L [name]  PT was able to speak  sats are stable  [name], [name] 11/22/2015, 9:34 AM

## 2015-11-23 DIAGNOSIS — I4891 Unspecified atrial fibrillation: Secondary | ICD-10-CM | POA: Diagnosis not present

## 2015-11-23 DIAGNOSIS — I480 Paroxysmal atrial fibrillation: Secondary | ICD-10-CM | POA: Diagnosis not present

## 2015-11-23 DIAGNOSIS — E1159 Type 2 diabetes mellitus with other circulatory complications: Secondary | ICD-10-CM | POA: Diagnosis not present

## 2015-11-23 DIAGNOSIS — J9601 Acute respiratory failure with hypoxia: Secondary | ICD-10-CM | POA: Diagnosis not present

## 2015-11-23 DIAGNOSIS — G9341 Metabolic encephalopathy: Secondary | ICD-10-CM | POA: Diagnosis not present

## 2015-11-23 DIAGNOSIS — N179 Acute kidney failure, unspecified: Secondary | ICD-10-CM | POA: Diagnosis not present

## 2015-11-23 DIAGNOSIS — J810 Acute pulmonary edema: Secondary | ICD-10-CM

## 2015-11-23 LAB — BASIC METABOLIC PANEL
ANION GAP: 11 (ref 5–15)
Anion gap: 13 (ref 5–15)
BUN: 26 mg/dL — AB (ref 6–20)
BUN: 27 mg/dL — AB (ref 6–20)
CALCIUM: 8.9 mg/dL (ref 8.9–10.3)
CHLORIDE: 97 mmol/L — AB (ref 101–111)
CO2: 27 mmol/L (ref 22–32)
CO2: 28 mmol/L (ref 22–32)
CREATININE: 0.97 mg/dL (ref 0.44–1.00)
Calcium: 9.2 mg/dL (ref 8.9–10.3)
Chloride: 101 mmol/L (ref 101–111)
Creatinine, Ser: 0.98 mg/dL (ref 0.44–1.00)
GFR calc non Af Amer: 56 mL/min — ABNORMAL LOW (ref >60)
GFR, EST NON AFRICAN AMERICAN: 55 mL/min — AB (ref >60)
Glucose, Bld: 148 mg/dL — ABNORMAL HIGH (ref 65–99)
Glucose, Bld: 268 mg/dL — ABNORMAL HIGH (ref 65–99)
POTASSIUM: 4.1 mmol/L (ref 3.5–5.1)
Potassium: 3.4 mmol/L — ABNORMAL LOW (ref 3.5–5.1)
SODIUM: 140 mmol/L (ref 135–145)
Sodium: 137 mmol/L (ref 135–145)

## 2015-11-23 LAB — GLUCOSE, CAPILLARY
GLUCOSE-CAPILLARY: 140 mg/dL — AB (ref 65–99)
GLUCOSE-CAPILLARY: 138 mg/dL — AB (ref 65–99)
GLUCOSE-CAPILLARY: 149 mg/dL — AB (ref 65–99)
Glucose-Capillary: 194 mg/dL — ABNORMAL HIGH (ref 65–99)
Glucose-Capillary: 127 mg/dL — ABNORMAL HIGH (ref 65–99)

## 2015-11-23 LAB — CULTURE, BLOOD (ROUTINE X 2): Culture: NO GROWTH

## 2015-11-23 LAB — MAGNESIUM
MAGNESIUM: 2 mg/dL (ref 1.7–2.4)
Magnesium: 1.8 mg/dL (ref 1.7–2.4)

## 2015-11-23 LAB — CBC
HCT: 29.8 % — ABNORMAL LOW (ref 36.0–46.0)
Hemoglobin: 9.4 g/dL — ABNORMAL LOW (ref 12.0–15.0)
MCH: 25.8 pg — ABNORMAL LOW (ref 26.0–34.0)
MCHC: 31.5 g/dL (ref 30.0–36.0)
MCV: 81.9 fL (ref 78.0–100.0)
PLATELETS: 290 10*3/uL (ref 150–400)
RBC: 3.64 MIL/uL — AB (ref 3.87–5.11)
RDW: 15.1 % (ref 11.5–15.5)
WBC: 9.8 10*3/uL (ref 4.0–10.5)

## 2015-11-23 LAB — PHOSPHORUS: Phosphorus: 4.4 mg/dL (ref 2.5–4.6)

## 2015-11-23 LAB — HEPARIN LEVEL (UNFRACTIONATED): HEPARIN UNFRACTIONATED: 0.37 [IU]/mL (ref 0.30–0.70)

## 2015-11-23 MED ORDER — POTASSIUM CHLORIDE CRYS ER 20 MEQ PO TBCR
40.0000 meq | EXTENDED_RELEASE_TABLET | Freq: Three times a day (TID) | ORAL | Status: AC
  Administered 2015-11-23 (×2): 40 meq via ORAL
  Filled 2015-11-23 (×2): qty 2

## 2015-11-23 MED ORDER — GLUCERNA SHAKE PO LIQD
237.0000 mL | Freq: Two times a day (BID) | ORAL | Status: DC
  Administered 2015-11-24 – 2015-11-27 (×4): 237 mL via ORAL

## 2015-11-23 MED ORDER — FUROSEMIDE 10 MG/ML IJ SOLN
40.0000 mg | Freq: Four times a day (QID) | INTRAMUSCULAR | Status: AC
  Administered 2015-11-23 (×3): 40 mg via INTRAVENOUS
  Filled 2015-11-23 (×2): qty 4

## 2015-11-23 MED ORDER — FUROSEMIDE 10 MG/ML IJ SOLN
40.0000 mg | Freq: Three times a day (TID) | INTRAMUSCULAR | Status: DC
  Filled 2015-11-23: qty 4

## 2015-11-23 MED ORDER — CETYLPYRIDINIUM CHLORIDE 0.05 % MT LIQD
7.0000 mL | Freq: Two times a day (BID) | OROMUCOSAL | Status: DC
  Administered 2015-11-23 – 2015-11-27 (×8): 7 mL via OROMUCOSAL

## 2015-11-23 MED ORDER — MAGNESIUM SULFATE IN D5W 10-5 MG/ML-% IV SOLN
1.0000 g | Freq: Once | INTRAVENOUS | Status: AC
  Administered 2015-11-23: 1 g via INTRAVENOUS
  Filled 2015-11-23: qty 100

## 2015-11-23 MED ORDER — POTASSIUM CHLORIDE 10 MEQ/50ML IV SOLN
10.0000 meq | INTRAVENOUS | Status: AC
  Administered 2015-11-23 (×4): 10 meq via INTRAVENOUS
  Filled 2015-11-23 (×4): qty 50

## 2015-11-23 NOTE — Progress Notes (Signed)
PULMONARY / CRITICAL CARE MEDICINE   Name: [name] MRN: [id number] DOB: May 26, 1941    ADMISSION DATE:  11/18/2015 CONSULTATION DATE:  11/20/2014  REFERRING MD:  Wendee Beavers  CHIEF COMPLAINT:  AMS  HISTORY OF PRESENT ILLNESS:   75 year old female with PMH as below, which includes bladder ca s/p resection now with chronic urostomy, PVD s/p L fem/tib bypass, and r BKA. Also with AF, CAD and MI in 2014, and DM. She was seen in ED several times this month for chest and neck pain which was treated as musculoskeletal pain. She was also recently prescribed keflex for UTI. She the presented again to the ED 4/8 with complaints of altered mental status and had reportedly not been taking her keflex or insulin. At time of admission she was having hallucinations and was only oriented to herself. She was admitted to the hospitalist team to be managed for HHNK, AKI, AF RVR, and UTI/SIRS. 4/9 AF persisted and she developed progressive dyspnea. 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 Started on dopamine 4/10 SOB worsened and CXR was consistent pulmonary edema and CHF. She was also lethargic and rapid response was called. The patient eventually required ICU transfer and intubation.   SUBJECTIVE:  Extubated and doing better.  VITAL SIGNS: BP 153/76 mmHg  Pulse 101  Temp(Src) 98.7 F (37.1 C) (Oral)  Resp 22  Wt 94.8 kg (208 lb 15.9 oz)  SpO2 93%  HEMODYNAMICS: CVP:  [7 mmHg-12 mmHg] 12 mmHg  VENTILATOR SETTINGS:    INTAKE / OUTPUT: I/O last 3 completed shifts: In: 3872 [I.V.:2202; NG/GT:560; IV Piggyback:1110] Out: 6175 [Urine:6175]  PHYSICAL EXAMINATION: General:  Obese elderly female, NAD, arousable and following commands. Neuro:  Alert and interactive, moving all ext to command. HEENT:  [place]/AT, PERRL Cardiovascular:  IRIR, rate controlled, no MRG, trace edema. Lungs:  Coarse BS diffusely. Abdomen:  Soft,  non-distended Musculoskeletal: No acute deformity or ROM limitation Skin:  Grossly intact  LABS:  BMET  Recent Labs Lab 11/22/15 0320 11/23/15 0015 11/23/15 0440  NA 136 137 140  K 4.1 3.4* 4.1  CL 104 97* 101  CO2 23 27 28   BUN 35* 26* 27*  CREATININE 1.22* 0.97 0.98  GLUCOSE 270* 268* 148*   Electrolytes  Recent Labs Lab 11/21/15 1244 11/22/15 0320 11/23/15 0015 11/23/15 0440  CALCIUM 9.0 8.9 8.9 9.2  MG 1.3* 1.6* 1.8 2.0  PHOS 2.0* 2.0*  --  4.4   CBC  Recent Labs Lab 11/21/15 0335 11/22/15 0320 11/23/15 0440  WBC 9.2 9.0 9.8  HGB 9.2* 9.1* 9.4*  HCT 28.4* 28.3* 29.8*  PLT 240 253 290   Coag's No results for input(s): APTT, INR in the last 168 hours.  Sepsis Markers  Recent Labs Lab 11/20/15 0445 11/20/15 1046 11/20/15 1717  LATICACIDVEN 0.8 1.4 1.0   ABG  Recent Labs Lab 11/20/15 1038 11/21/15 1006 11/22/15 0345  PHART 7.064* 7.482* 7.464*  PCO2ART 47.3* 25.4* 33.2*  PO2ART 42.0* 53.0* 116*   Liver Enzymes  Recent Labs Lab 11/19/15 0445 11/20/15 1045  AST 37 54*  ALT 54 57*  ALKPHOS 146* 168*  BILITOT 0.5 0.5  ALBUMIN 1.9* 1.9*   Cardiac Enzymes  Recent Labs Lab 11/18/15 1614 11/18/15 1942 11/20/15 1045  TROPONINI 0.08* 0.07* <0.03   Glucose  Recent Labs Lab 11/22/15 0759 11/22/15 1215 11/22/15 1610 11/22/15 1940 11/22/15 2316 11/23/15 0437  GLUCAP 260* 271* 228* 203*  194* 127*   Imaging No results found. STUDIES:  Echo 4/10 > EF 65-70%  CULTURES: BCx2 4/8 > Coag neg staph in pediatric bottle only Urine 4/8 > multiple species  ANTIBIOTICS: Rocephin 4/8 >4/10 Cefepime 4/10>>>4/13 Vancomycin 4/8 >4/13  SIGNIFICANT EVENTS: 4/8 admit 4/9 AFRVR to junctional brady. Progressive dyspnea 4/10 respiratory arrest > to ICU > intubated   LINES/TUBES: ETT 4/10 >4/12 LIJ CVL 4/10 >  I reviewed CXR myself, pulmonary edema.  DISCUSSION: 75 year old female with PMH of CAD, and bladder Ca s/p resection.  Admitted 4/8 for UTI/AF RVR and developed progressive dyspnea attributed to CHF in setting AF. Then converted to junctional brady. 4/10 suffered respiratory arrest requiring intubation. Plan to support with vent, dopamine. Continue broad spectrum antibiotics.   ASSESSMENT / PLAN:  PULMONARY A: Acute hypoxemic/hypercarbic respiratory failure secondary to pulmonary edema  P:   Continue diureses. Titrate O2 for sat of 88-92%. IS per RT protocol. Ambulate.  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  A-fib with RVR overnight  P:  Telemetry monitoring. EP following. D/C Dopamine. Amiodarone drip. Lasix as below. Echo EF 65-70%. ?pacemaker will defer to EP  RENAL A:   AKI > improving Hyperkalemia  P:   Repeat BMP. D/C Bicarb drip. Lasix 40 mg IV q6 x3 doses. Replace electrolytes as indicated.  GASTROINTESTINAL A:   No acute issues  P:   Swallow evaluation. Protonix for SUP.  HEMATOLOGIC A:   Anticoagulation in setting PAF  P:  Continue heparin gtt per pharmacy  INFECTIOUS A:   SIRS/Sepsis UTI Bacteremia vs contamination (coag neg staph in one bottle)  P:   D/C cefepime, vancomycin as above Trend WBC and fever curve. Repeat BC 4/10 results pending.  ENDOCRINE A:   DM  P:   CBG and SSI monitoring Restart Lantus at 15 BID.  NEUROLOGIC A:   Acute metabolic encephalopathy  P:   RASS goal: -1 to -2. Intermittent sedation.  FAMILY  - Updates: No family bedside, patient updated.  - Inter-disciplinary family meet or Palliative Care meeting due by:  4/17.  Discussed with TRH-MD, transfer to tele and to [facility] service with PCCM off 4/14.  Rush Farmer, M.D. [facility] Pulmonary/Critical Care Medicine. Pager: [phone]. After hours pager: [phone].

## 2015-11-23 NOTE — Progress Notes (Signed)
SUBJECTIVE: The patient was extubated yesterday, denies any CP, palpitations, remains with some SOB, not severe.  She c/o posterior neck pain, states this is chronic and gives her headaches has been steadily worsening in the last few months, was to be seen by a specialist, but hasn't yet.  Marland Kitchen antiseptic oral rinse  7 mL Mouth Rinse 10 times per day  . aspirin  81 mg Oral Daily  . atorvastatin  80 mg Oral q1800  . ceFEPime (MAXIPIME) IV  2 g Intravenous Q24H  . chlorhexidine gluconate (SAGE KIT)  15 mL Mouth Rinse BID  . feeding supplement (VITAL HIGH PROTEIN)  1,000 mL Per Tube Q24H  . fentaNYL (SUBLIMAZE) injection  25 mcg Intravenous Once  . insulin aspart  0-20 Units Subcutaneous Q4H  . insulin glargine  20 Units Subcutaneous BID  . off the beat book   Does not apply Once  . pantoprazole (PROTONIX) IV  40 mg Intravenous Q24H  . sodium chloride flush  10-40 mL Intracatheter Q12H  . sodium chloride flush  3 mL Intravenous Q12H  . vancomycin  1,000 mg Intravenous Q24H   . sodium chloride Stopped (11/21/15 1239)  . amiodarone 30 mg/hr (11/23/15 0008)  . heparin 1,300 Units/hr (11/23/15 9794)  . phenylephrine (NEO-SYNEPHRINE) Adult infusion Stopped (11/21/15 2217)    OBJECTIVE: Physical Exam: Filed Vitals:   11/23/15 0530 11/23/15 0545 11/23/15 0600 11/23/15 0630  BP: 148/70  138/55 125/58  Pulse: 90 95 92 90  Temp:      TempSrc:      Resp: '19 23 18 16  '$ Weight:      SpO2: 96% 95% 96% 99%    Intake/Output Summary (Last 24 hours) at 11/23/15 0727 Last data filed at 11/23/15 0600  Gross per 24 hour  Intake 2138.1 ml  Output   4225 ml  Net -2086.9 ml    Telemetry reveals SR  GEN- The patient is AAO x3, appears in NAD Head- normocephalic, atraumatic Eyes-  Sclera clear, conjunctiva pink Ears- hearing intact Lungs- normal work of breathing, crackles b/l Heart- RRR, no significant murmurs, no rubs or gallops GI- soft, NT, ND, urostomy tube in place Extremities- 1++  edema , LLE, L great toe missing, chronic looking skin changes, RLE BKA,  Skin- no rash or lesion Psych- euthymic mood, full affect Neuro- no gross deficits appreciated  LABS: Basic Metabolic Panel:  Recent Labs  11/22/15 0320 11/23/15 0015 11/23/15 0440  NA 136 137 140  K 4.1 3.4* 4.1  CL 104 97* 101  CO2 '23 27 28  '$ GLUCOSE 270* 268* 148*  BUN 35* 26* 27*  CREATININE 1.22* 0.97 0.98  CALCIUM 8.9 8.9 9.2  MG 1.6* 1.8 2.0  PHOS 2.0*  --  4.4   Liver Function Tests:  Recent Labs  11/20/15 1045  AST 54*  ALT 57*  ALKPHOS 168*  BILITOT 0.5  PROT 5.7*  ALBUMIN 1.9*   CBC:  Recent Labs  11/20/15 1045  11/22/15 0320 11/23/15 0440  WBC 10.5  < > 9.0 9.8  NEUTROABS 9.4*  --   --   --   HGB 10.0*  < > 9.1* 9.4*  HCT 32.7*  < > 28.3* 29.8*  MCV 84.5  < > 80.4 81.9  PLT 276  < > 253 290  < > = values in this interval not displayed. Cardiac Enzymes:  Recent Labs  11/20/15 1045  TROPONINI <0.03     ASSESSMENT AND PLAN:  1.  New PAFib,  Tachy-brady, suspect secondary to acute medical issues, metabolic derangement, respiratory failure.  in SR this morning, Vannesa Abair continue amiodarone gtt as her respiratory status continues to improve      CHA2DS2Vasc is at least 4 on heparin gtt (pharmacy managing)      BP remains stable      CBC/plts stable  2. Respiratory failure      Extubated yesterday  still remains fluid + overall, but neg another -2086 ml yesterday ( 2 day negative - 3787m)      Continue IV diuresis with CCM  3. CAD  No reports of CP     Trop I  0.08,0.07, 0.03  4. UTI  Urostomy tube  Hx of bladder cancer  mgmt with IM service  5. Encephalopathy, multifactorial  resolved     Admit note suspected multifactorial with dehydration, infection, possibly polypharmacy with recent fill of 2 narcotic presriptions  6. Hyperkalemia, CRI  all improved, continue with IM and critical care  7. DM OOC     Better, continue with  medicine service  8. Posterior neck pain, headaches     Patient reports chronic but worse in the last few months with headaches was pending to see specialist     Lorry Furber defer pain management to medicine service  RTommye Standard PA-C 11/23/2015 7:27 AM   I have seen and examined this patient with RTommye Standard  Agree with above, note added to reflect my findings.  On exam, regular rhythm, no murmurs, faint crackles at the bases.  Extubated yesterday and doing well.  In sinus rhythm.  Would continue amiodarone GGT as she is not taking oral at this point.  Would plan to switch her to PO amiodarone 400 BID when taking PO.  Would also continue anticoagulation.  Katie Moch need anticoagulation at discharge.    Ireene Ballowe M. Kariana Wiles MD 11/23/2015 3:33 PM

## 2015-11-23 NOTE — Progress Notes (Signed)
Nutrition Follow-up  DOCUMENTATION CODES:   Obesity unspecified  INTERVENTION:   Glucerna Shake po BID, each supplement provides 220 kcal and 10 grams of protein  NUTRITION DIAGNOSIS:   Inadequate oral intake related to poor appetite as evidenced by meal completion < 50%. Ongoing.   GOAL:   Patient will meet greater than or equal to 90% of their needs Progressing.   MONITOR:   PO intake, Supplement acceptance, Weight trends, Skin  ASSESSMENT:   Pt with PMH of CAD, and bladder Ca s/p resection with chronic urostomy, PVD s/p L fem/tib bypass and R BKA, DM with concern for noncompliance with insulin. Admitted 4/8 for UTI/AF RVR and developed progressive dyspnea attributed to CHF in setting AF. Then converted to junctional brady. 4/10 suffered respiratory arrest requiring intubation.   4/12 extubated  Per pt daughter lives with her but her daughter just had an amputation and is learning to walk again. Pt stays in her wheelchair for most activities but can use a cane to go to church. Reports decreased appetite PTA. Usually only eats dinner. Sometimes snacks at breakfast and lunch time.  Willing to drink Glucerna shake for better nutrition.   Labs reviewed: CBG's: 127-140  Diet Order:  Diet heart healthy/carb modified Room service appropriate?: Yes; Fluid consistency:: Thin  Skin:  Wound (see comment) (excoriated in abd folds)  Last BM:  unknown  Height:   Ht Readings from Last 1 Encounters:  11/14/15 5\' 2"  (1.575 m)    Weight:   Wt Readings from Last 1 Encounters:  11/23/15 208 lb 15.9 oz (94.8 kg)    Ideal Body Weight:  42.5 kg (adj for R BKA)  BMI:  Body mass index is 38.22 kg/(m^2).  Estimated Nutritional Needs:   Kcal:  1400-1600  Protein:  85-100 grams  Fluid:  > 1.5 L/day  EDUCATION NEEDS:   No education needs identified at this time  [place], [place], [place] Pager [phone] After Hours Pager

## 2015-11-23 NOTE — Progress Notes (Addendum)
[facility] Progress Note Patient Name: AKEIRA SWENDSEN DOB: 12-14-1940 MRN: [id number]   Date of Service  11/23/2015  HPI/Events of Note  K+ = 3.4, Mg++ = 1.8 and Creatinine = 0.97.  eICU Interventions  Will replete K+ and Mg++.     Intervention Category Intermediate Interventions: Electrolyte abnormality - evaluation and management  Sommer,Steven Eugene 11/23/2015, 1:10 AM

## 2015-11-23 NOTE — Evaluation (Signed)
Clinical/Bedside Swallow Evaluation Patient Details  Name: PARA ASTI MRN: [id number] Date of Birth: 1940/10/23  Today's Date: 11/23/2015 Time: SLP Start Time (ACUTE ONLY): 0830 SLP Stop Time (ACUTE ONLY): 0842 SLP Time Calculation (min) (ACUTE ONLY): 12 min  Past Medical History:  Past Medical History  Diagnosis Date  . Hypertension   . Hyperlipidemia   . History of atrial tachycardia   . Peripheral vascular disease (HCC) S/P RIGHT BKA  . Peripheral neuropathy (HCC) LEGS AND HANDS  . 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 GERD (gastroesophageal reflux disease)   . Anemia   . Blood transfusion   . Macular degeneration of both eyes   . Glaucoma   . Retinopathy due to secondary diabetes mellitus ([place])   . History of bladder cancer TCC AND CIS  . Myocardial infarction ([place])   . Presence of urostomy ([place])   . Type 2 diabetes mellitus with vascular disease ([place])   . CKD (chronic kidney disease) stage 3, GFR 30-59 ml/min 01/26/2013  . Obesity (BMI 30-39.9)   . CAD (coronary artery disease) 01/26/2013    Evidently circumflex stent in 2014 following bladder cancer surgery for old myocardial infarction.    Past Surgical History:  Past Surgical History  Procedure Laterality Date  . I & d right below knee amputation wound  06-10-2011  . Cysto / resection bladder bx's  04-01-11  &  11-26-10  . Below knee leg amputation  05-02-2007    RIGHT  . Right foot i & d / removal necrotic bone  JUN  &  AUG 2008  . Right great toe amputation  11-05-2006  . Laparoscopic cholecystectomy  12-10-1999  . Back surgery  [place]  . Appendectomy  1963  . Cataract extraction w/ intraocular lens  implant, bilateral    . Cystoscopy with biopsy  10/14/2011    Procedure: CYSTOSCOPY WITH BIOPSY;  Surgeon: Claybon Jabs, MD;  Location[facility];  Service: Urology;  Laterality: N/A;  BLADDER BIOPSY  . Coronary stent placement    . Abdominal hysterectomy    .  Revision urostomy cutaneous    . Revision urostomy cutaneous    . Abdominal angiogram N/A 09/08/2014    Procedure: ABDOMINAL ANGIOGRAM;  Surgeon: Conrad [place], MD;  Location: [facility] CATH LAB;  Service: Cardiovascular;  Laterality: N/A;  . Femoral-popliteal bypass graft Left 09/12/2014    Procedure:  LEFT FEMORAL-POPLITEAL ARTERY BYPASS GRAFT USING NON REVERSE LEFT GREATER SAPHENOUS VEIN;  Surgeon: Rosetta Posner, MD;  Location: [facility];  Service: Vascular;  Laterality: Left;  . Amputation Left 09/14/2014    Procedure: LEFT FOOT FIRST RAY AMPUTATION;  Surgeon: Mcarthur Rossetti, MD;  Location: [facility];  Service: Orthopedics;  Laterality: Left;   HPI:  75 y.o. female with h/o HTN, HLD, atrial tachycardia controlled with lopressor, peripheral vascular disease, peripheral neuropathy, GERD, DM on oral and insulin meds, bladder cancer, mitral incompentence, CAD, CKD, MI, who presented to ED with c/o chest and neck pain, AMS with s/s of UTI. Intubated 4/10-4/12. CXR 4/12 persistent changes of congestive heart failure bibasilar pulmonary edema and bilateral pleural effusions. Similar findings noted on prior exam. Bilateral pneumonia cannot be excluded.   Assessment / Plan / Recommendation Clinical Impression  Pt demonstrates normal swallow function, no signs of aspiration. Voice is low in volume but clear. Risk of silent aspiration low. Pt did have slight cough following solids and reports some globus in  am. Encouraged following solids wth liquids. Pt can initiate a regular diet and thin liquids. No SLP f/u needed, will sign off.     Aspiration Risk  Mild aspiration risk    Diet Recommendation Regular;Thin liquid   Liquid Administration via: Cup;Straw Medication Administration: Whole meds with liquid Supervision: Patient able to self feed Compensations: Follow solids with liquid Postural Changes: Seated upright at 90 degrees    Other  Recommendations Oral Care Recommendations: Oral care BID   Follow up  Recommendations  None    Frequency and Duration            Prognosis        Swallow Study   General HPI: 75 y.o. female with h/o HTN, HLD, atrial tachycardia controlled with lopressor, peripheral vascular disease, peripheral neuropathy, GERD, DM on oral and insulin meds, bladder cancer, mitral incompentence, CAD, CKD, MI, who presented to ED with c/o chest and neck pain, AMS with s/s of UTI. Intubated 4/10-4/12. CXR 4/12 persistent changes of congestive heart failure bibasilar pulmonary edema and bilateral pleural effusions. Similar findings noted on prior exam. Bilateral pneumonia cannot be excluded. Type of Study: Bedside Swallow Evaluation Previous Swallow Assessment: none found Diet Prior to this Study: NPO Temperature Spikes Noted: No Respiratory Status: Nasal cannula History of Recent Intubation: Yes Length of Intubations (days): 3 days Date extubated: 11/22/15 Behavior/Cognition: Alert;Cooperative;Pleasant mood Oral Cavity Assessment: Within Functional Limits Oral Care Completed by SLP: Yes Oral Cavity - Dentition: Adequate natural dentition Vision: Functional for self-feeding Self-Feeding Abilities: Able to feed self Patient Positioning: Upright in bed Baseline Vocal Quality: Low vocal intensity Volitional Cough: Strong Volitional Swallow: Able to elicit    Oral/Motor/Sensory Function Overall Oral Motor/Sensory Function: Within functional limits   Ice Chips     Thin Liquid Thin Liquid: Within functional limits Presentation: Cup;Straw;Self Fed    Nectar Thick Nectar Thick Liquid: Not tested   Honey Thick Honey Thick Liquid: Not tested   Puree Puree: Within functional limits   Solid   GO   Solid: Impaired Pharyngeal Phase Impairments: Cough - Immediate Other Comments: Cued following bites with sips to clear dry masticated material       Herbie [place], MA CCC-SLP [phone]   Lynann Beaver 11/23/2015,8:57 AM

## 2015-11-23 NOTE — Progress Notes (Signed)
[facility] for Heparin Indication: atrial fibrillation  Allergies  Allergen Reactions  . Gabapentin Other (See Comments)    Extremely drunk and lethargic  . Lisinopril Other (See Comments)    Increased creatinine levels (pt currently taking low dose)  . Metformin And Related Other (See Comments)    Increased creatinine levels  . Biaxin [Clarithromycin] Itching    Patient Measurements: Height/Weight from 11/14/2015 Ht. 5'2" Wt. 89.8kg Heparin Dosing Weight: 70.8kg  Vital Signs: Temp: 98.7 F (37.1 C) (04/13 0800) Temp Source: Oral (04/13 0800) BP: 153/76 mmHg (04/13 0800) Pulse Rate: 101 (04/13 0800)  Labs:  Recent Labs  11/20/15 1045  11/21/15 0335  11/22/15 0320 11/22/15 0331 11/23/15 0015 11/23/15 0440  HGB 10.0*  --  9.2*  --  9.1*  --   --  9.4*  HCT 32.7*  --  28.4*  --  28.3*  --   --  29.8*  PLT 276  --  240  --  253  --   --  290  HEPARINUNFRC  --   --  0.37  --   --  0.38  --  0.37  CREATININE 1.75*  < >  --   < > 1.22*  --  0.97 0.98  TROPONINI <0.03  --   --   --   --   --   --   --   < > = values in this interval not displayed. Estimated Creatinine Clearance: 54.1 mL/min (by C-G formula based on Cr of 0.98).   Assessment: This patient is known to pharmacy from past heparin therapy (2014).  She presents with atrial fibrillation and we have been asked to initiate IV heparin for her.  She required higher than calculated dose using adjusted body weight in the past.    Heparin level at goal, CBC stable  Goal of Therapy:  Heparin level 0.3-0.7 units/ml Monitor platelets by anticoagulation protocol: Yes   Plan:  Continue Heparin infusion at 1300 units / hr Daily heparin level, CBC  Change to po anti-coagulation?  Thank you Anette Guarneri, PharmD [phone]   11/23/2015 9:00 AM

## 2015-11-23 NOTE — Evaluation (Signed)
Physical Therapy Evaluation Patient Details Name: [name] MRN: [id number] DOB: [birth year] Today's Date: 11/23/2015   History of Present Illness  75 y/o with hx of bladder cancer s/p resection and chronic urostomy, hx of PVD s/p left fem-tib bypass as well as prior right BKA secondary to non healing ulcer. Presented on 4/8 with AMS for UTI/AF RVR and developed progressive dyspnea attributed to CHF in setting AF. On 4/10 pt with resp arrest and code blue, transferred to ICU and intubated; extubated 4/12.  Clinical Impression  Patient demonstrates deficits in functional mobility as indicated below. Will need continued skilled PT to address deficits and maximize function. Will see as indicated and progress as tolerated. Patient open to [facility] upon acute discharge.     Follow Up Recommendations SNF    Equipment Recommendations  None recommended by PT    Recommendations for Other Services       Precautions / Restrictions Precautions Precautions: Fall Precaution Comments: watch HR and O2 Restrictions Weight Bearing Restrictions: No      Mobility  Bed Mobility Overal bed mobility: Needs Assistance;+ 2 for safety/equipment Bed Mobility: Supine to Sit     Supine to sit: Mod assist     General bed mobility comments: Moderate assist to come to EOB, increased time and effort to perform with HOB elevated. Patient easily fatigues  Transfers Overall transfer level: Needs assistance Equipment used: 2 person hand held assist Transfers: Stand Pivot Transfers;Sit to/from Stand Sit to Stand: +2 physical assistance;Mod assist Stand pivot transfers: +2 physical assistance;Mod assist       General transfer comment: Moderate assist for stability. patient able to power up to standing with cues for hand placement. Assist to maintain static standing. increased effort and elevated HR (120s) during pivotal steps to chair  Ambulation/Gait                Stairs             Wheelchair Mobility    Modified Rankin (Stroke Patients Only)       Balance Overall balance assessment: Needs assistance Sitting-balance support: Feet supported Sitting balance-Leahy Scale: Fair     Standing balance support: Bilateral upper extremity supported;During functional activity Standing balance-Leahy Scale: Poor Standing balance comment: reliance on UE support in static standing                             Pertinent Vitals/Pain Pain Assessment: 0-10 Pain Score: 4  Pain Location: chest area from where pads were placed Pain Descriptors / Indicators: Sore Pain Intervention(s): Repositioned;Monitored during session    Home Living Family/patient expects to be discharged to:: Private residence Living Arrangements: Children (daughter live with mom) Available Help at Discharge: Family Type of Home: House       Home Layout: One level Home Equipment: Wheelchair - manual;Bedside commode;Walker - 2 wheels;Electric scooter Additional Comments: daughter with recent BKA as well and was just fitted with prosthesis    Prior Function Level of Independence: Independent with assistive device(s)   Gait / Transfers Assistance Needed: uses scooter chair most of the time but can transfer and mobilize independently with Rw and prosthesis PTA           Hand Dominance   Dominant Hand: Right    Extremity/Trunk Assessment   Upper Extremity Assessment: Generalized weakness           Lower Extremity Assessment: Generalized weakness;RLE deficits/detail RLE Deficits / Details: R  BKA        Communication   Communication: No difficulties  Cognition Arousal/Alertness: Awake/alert Behavior During Therapy: WFL for tasks assessed/performed Overall Cognitive Status: Within Functional Limits for tasks assessed                      General Comments General comments (skin integrity, edema, etc.): assist required to don prosthesis, increased effort to  place sock over residual limb, noted increased edema in LLE and in residual Right limb    Exercises        Assessment/Plan    PT Assessment Patient needs continued PT services  PT Diagnosis Difficulty walking;Generalized weakness   PT Problem List Decreased strength;Decreased range of motion;Decreased activity tolerance;Decreased balance;Decreased mobility;Cardiopulmonary status limiting activity;Obesity;Pain  PT Treatment Interventions DME instruction;Gait training;Functional mobility training;Therapeutic activities;Therapeutic exercise;Balance training;Patient/family education   PT Goals (Current goals can be found in the Care Plan section) Acute Rehab PT Goals Patient Stated Goal: to get stronger so she can take care of herself PT Goal Formulation: With patient Time For Goal Achievement: 12/07/15 Potential to Achieve Goals: Good    Frequency Min 3X/week   Barriers to discharge Decreased caregiver support daughter also with BKA and is just beginning her prosthesis training    Co-evaluation               End of Session Equipment Utilized During Treatment: Gait belt;Oxygen Activity Tolerance: Patient limited by fatigue Patient left: in chair;with call bell/phone within reach;with chair alarm set Nurse Communication: Mobility status         Time: [id number] PT Time Calculation (min) (ACUTE ONLY): 27 min   Charges:   PT Evaluation $PT Eval High Complexity: 1 Procedure PT Treatments $Therapeutic Activity: 8-22 mins   PT G CodesDuncan Dull 12-18-15, 4:38 PM Alben Deeds, [place] DPT  [phone]

## 2015-11-24 DIAGNOSIS — I10 Essential (primary) hypertension: Secondary | ICD-10-CM | POA: Diagnosis not present

## 2015-11-24 DIAGNOSIS — E875 Hyperkalemia: Secondary | ICD-10-CM | POA: Diagnosis not present

## 2015-11-24 DIAGNOSIS — E1159 Type 2 diabetes mellitus with other circulatory complications: Secondary | ICD-10-CM | POA: Diagnosis not present

## 2015-11-24 DIAGNOSIS — N183 Chronic kidney disease, stage 3 (moderate): Secondary | ICD-10-CM

## 2015-11-24 DIAGNOSIS — I4891 Unspecified atrial fibrillation: Secondary | ICD-10-CM | POA: Diagnosis not present

## 2015-11-24 DIAGNOSIS — E1121 Type 2 diabetes mellitus with diabetic nephropathy: Secondary | ICD-10-CM | POA: Diagnosis not present

## 2015-11-24 DIAGNOSIS — J9601 Acute respiratory failure with hypoxia: Secondary | ICD-10-CM | POA: Diagnosis not present

## 2015-11-24 DIAGNOSIS — Z794 Long term (current) use of insulin: Secondary | ICD-10-CM

## 2015-11-24 LAB — BASIC METABOLIC PANEL
ANION GAP: 10 (ref 5–15)
BUN: 23 mg/dL — ABNORMAL HIGH (ref 6–20)
CALCIUM: 9.6 mg/dL (ref 8.9–10.3)
CO2: 31 mmol/L (ref 22–32)
Chloride: 100 mmol/L — ABNORMAL LOW (ref 101–111)
Creatinine, Ser: 1.12 mg/dL — ABNORMAL HIGH (ref 0.44–1.00)
GFR, EST AFRICAN AMERICAN: 55 mL/min — AB (ref >60)
GFR, EST NON AFRICAN AMERICAN: 47 mL/min — AB (ref >60)
GLUCOSE: 142 mg/dL — AB (ref 65–99)
POTASSIUM: 4.5 mmol/L (ref 3.5–5.1)
Sodium: 141 mmol/L (ref 135–145)

## 2015-11-24 LAB — PHOSPHORUS: PHOSPHORUS: 2.8 mg/dL (ref 2.5–4.6)

## 2015-11-24 LAB — GLUCOSE, CAPILLARY
GLUCOSE-CAPILLARY: 138 mg/dL — AB (ref 65–99)
GLUCOSE-CAPILLARY: 159 mg/dL — AB (ref 65–99)
GLUCOSE-CAPILLARY: 224 mg/dL — AB (ref 65–99)
GLUCOSE-CAPILLARY: 245 mg/dL — AB (ref 65–99)
Glucose-Capillary: 203 mg/dL — ABNORMAL HIGH (ref 65–99)
Glucose-Capillary: 102 mg/dL — ABNORMAL HIGH (ref 65–99)
Glucose-Capillary: 219 mg/dL — ABNORMAL HIGH (ref 65–99)

## 2015-11-24 LAB — CBC
HEMATOCRIT: 30.9 % — AB (ref 36.0–46.0)
HEMOGLOBIN: 9.5 g/dL — AB (ref 12.0–15.0)
MCH: 25.5 pg — ABNORMAL LOW (ref 26.0–34.0)
MCHC: 30.7 g/dL (ref 30.0–36.0)
MCV: 83.1 fL (ref 78.0–100.0)
Platelets: 304 10*3/uL (ref 150–400)
RBC: 3.72 MIL/uL — AB (ref 3.87–5.11)
RDW: 15 % (ref 11.5–15.5)
WBC: 7.5 10*3/uL (ref 4.0–10.5)

## 2015-11-24 LAB — MAGNESIUM: Magnesium: 1.5 mg/dL — ABNORMAL LOW (ref 1.7–2.4)

## 2015-11-24 LAB — HEPARIN LEVEL (UNFRACTIONATED): HEPARIN UNFRACTIONATED: 0.33 [IU]/mL (ref 0.30–0.70)

## 2015-11-24 MED ORDER — DOCUSATE SODIUM 100 MG PO CAPS: 100.0000 mg | ORAL_CAPSULE | Freq: Two times a day (BID) | ORAL | Status: DC

## 2015-11-24 MED ORDER — AMIODARONE HCL 200 MG PO TABS
200.0000 mg | ORAL_TABLET | Freq: Two times a day (BID) | ORAL | Status: DC
  Administered 2015-11-24 – 2015-11-27 (×7): 200 mg via ORAL
  Filled 2015-11-24 (×8): qty 1

## 2015-11-24 MED ORDER — FUROSEMIDE 40 MG PO TABS
40.0000 mg | ORAL_TABLET | Freq: Once | ORAL | Status: AC
  Administered 2015-11-24: 40 mg via ORAL
  Filled 2015-11-24: qty 1

## 2015-11-24 MED ORDER — POLYETHYLENE GLYCOL 3350 17 G PO PACK
17.0000 g | PACK | Freq: Every day | ORAL | Status: DC
  Administered 2015-11-25 – 2015-11-27 (×4): 17 g via ORAL
  Filled 2015-11-24 (×4): qty 1

## 2015-11-24 MED ORDER — BISACODYL 10 MG RE SUPP
10.0000 mg | Freq: Every day | RECTAL | Status: DC
  Filled 2015-11-24 (×2): qty 1

## 2015-11-24 MED ORDER — RIVAROXABAN 20 MG PO TABS
20.0000 mg | ORAL_TABLET | Freq: Every day | ORAL | Status: DC
  Administered 2015-11-24: 20 mg via ORAL
  Filled 2015-11-24: qty 1

## 2015-11-24 MED ORDER — RIVAROXABAN 20 MG PO TABS
20.0000 mg | ORAL_TABLET | Freq: Every day | ORAL | Status: DC
  Administered 2015-11-26: 20 mg via ORAL
  Filled 2015-11-24 (×2): qty 1

## 2015-11-24 MED ORDER — INSULIN ASPART 100 UNIT/ML ~~LOC~~ SOLN: 0.0000 [IU] | Freq: Three times a day (TID) | SUBCUTANEOUS | Status: DC

## 2015-11-24 MED ORDER — RIVAROXABAN 20 MG PO TABS
20.0000 mg | ORAL_TABLET | Freq: Every day | ORAL | Status: AC
  Administered 2015-11-25: 20 mg via ORAL
  Filled 2015-11-24: qty 1

## 2015-11-24 MED ORDER — MAGNESIUM SULFATE 2 GM/50ML IV SOLN
2.0000 g | Freq: Once | INTRAVENOUS | Status: AC
  Administered 2015-11-24: 2 g via INTRAVENOUS
  Filled 2015-11-24: qty 50

## 2015-11-24 MED ORDER — METOPROLOL TARTRATE 1 MG/ML IV SOLN
2.5000 mg | Freq: Four times a day (QID) | INTRAVENOUS | Status: DC | PRN
  Administered 2015-11-24: 2.5 mg via INTRAVENOUS
  Filled 2015-11-24: qty 5

## 2015-11-24 MED ORDER — FUROSEMIDE 40 MG PO TABS
40.0000 mg | ORAL_TABLET | Freq: Two times a day (BID) | ORAL | Status: DC
  Administered 2015-11-24 – 2015-11-27 (×6): 40 mg via ORAL
  Filled 2015-11-24 (×6): qty 1

## 2015-11-24 NOTE — Clinical Social Work Note (Signed)
Clinical Social Work Assessment  Patient Details  Name: [name] MRN: [id number] Date of Birth: 10-12-1940  Date of referral:  11/24/15               Reason for consult:  Facility Placement                Permission sought to share information with:  Facility Sport and exercise psychologist, Family Supports Permission granted to share information::  Yes, Verbal Permission Granted  Name::     Horris Latino  Agency::  [facility] SNFs  Relationship::  Daughter  Contact Information:  [phone]  Housing/Transportation Living arrangements for the past 2 months:  [place] of Information:  Patient Patient Interpreter Needed:  None Criminal Activity/Legal Involvement Pertinent to Current Situation/Hospitalization:  No - Comment as needed Significant Relationships:  Adult Children, Friend Lives with:  Adult Children Do you feel safe going back to the place where you live?  No Need for family participation in patient care:  Yes (Comment)  Care giving concerns:  CSW received referral for possible SNF placement at time of discharge. CSW met with patient regarding PT recommendation of SNF placement at time of discharge. Patient states she uses a wheelchair and a cane at home. Patient reported that she lives at home with her daughter, who has a prosthetic leg and also uses a wheelchair. Patient states she cannot take care of herself and her daughter if she goes home and would rather go to a SNF for a while. Patient expressed understanding of PT recommendation and is agreeable to SNF placement at time of discharge. CSW to continue to follow and assist with discharge planning needs.    Social Worker assessment / plan:  CSW spoke with patient concerning possibility of rehab at [facility] before returning home.  Employment status:  Retired Surveyor, [employer] Care PT Recommendations:  [place] / Referral to community resources:  [place]  Patient/Family's Response to care:  Patient recognizes need for rehab before returning home and is agreeable to a SNF in [place]. Patient reported preference for [facility] since she has been there before and they had "excellent physical therapy". Patient reports understanding of insurance authorization process.   Patient/Family's Understanding of and Emotional Response to Diagnosis, Current Treatment, and Prognosis:  Patient is realistic regarding therapy needs. No questions/concerns about plan or treatment.    Emotional Assessment Appearance:  Appears stated age Attitude/Demeanor/Rapport:   (Appropriate and pleasant) Affect (typically observed):  Accepting, Appropriate, Pleasant Orientation:  Oriented to Situation, Oriented to  Time, Oriented to Place, Oriented to Self Alcohol / Substance use:  Not Applicable Psych involvement (Current and /or in the community):  No (Comment)  Discharge Needs  Concerns to be addressed:  Care Coordination Readmission within the last 30 days:  No Current discharge risk:  None Barriers to Discharge:  Continued Medical Work up   [employer], [place] 11/24/2015, 3:03 PM

## 2015-11-24 NOTE — Discharge Instructions (Addendum)
Your A1c level for your Diabetes is 14.8% this admission. Goal is 7% or less. This tells [place] your glucose control at home is not very good. Please follow up with your PCP for further medication and nutrition management.  Atrial Fibrillation Atrial fibrillation is a type of irregular or rapid heartbeat (arrhythmia). In atrial fibrillation, the heart quivers continuously in a chaotic pattern. This occurs when parts of the heart receive disorganized signals that make the heart unable to pump blood normally. This can increase the risk for stroke, heart failure, and other heart-related conditions. There are different types of atrial fibrillation, including:  Paroxysmal atrial fibrillation. This type starts suddenly, and it usually stops on its own shortly after it starts.  Persistent atrial fibrillation. This type often lasts longer than a week. It may stop on its own or with treatment.  Long-lasting persistent atrial fibrillation. This type lasts longer than 12 months.  Permanent atrial fibrillation. This type does not go away. Talk with your health care provider to learn about the type of atrial fibrillation that you have. CAUSES This condition is caused by some heart-related conditions or procedures, including:  A heart attack.  Coronary artery disease.  Heart failure.  Heart valve conditions.  High blood pressure.  Inflammation of the sac that surrounds the heart (pericarditis).  Heart surgery.  Certain heart rhythm disorders, such as Wolf-Parkinson-White syndrome. Other causes include:  Pneumonia.  Obstructive sleep apnea.  Blockage of an artery in the lungs (pulmonary embolism, or PE).  Lung cancer.  Chronic lung disease.  Thyroid problems, especially if the thyroid is overactive (hyperthyroidism).  Caffeine.  Excessive alcohol use or illegal drug use.  Use of some medicines, including certain decongestants and diet pills. Sometimes, the cause cannot be found. RISK  FACTORS This condition is more likely to develop in:  People who are older in age.  People who smoke.  People who have diabetes mellitus.  People who are overweight (obese).  Athletes who exercise vigorously. SYMPTOMS Symptoms of this condition include:  A feeling that your heart is beating rapidly or irregularly.  A feeling of discomfort or pain in your chest.  Shortness of breath.  Sudden light-headedness or weakness.  Getting tired easily during exercise. In some cases, there are no symptoms. DIAGNOSIS Your health care provider may be able to detect atrial fibrillation when taking your pulse. If detected, this condition may be diagnosed with:  An electrocardiogram (ECG).  A Holter monitor test that records your heartbeat patterns over a 24-hour period.  Transthoracic echocardiogram (TTE) to evaluate how blood flows through your heart.  Transesophageal echocardiogram (TEE) to view more detailed images of your heart.  A stress test.  Imaging tests, such as a CT scan or chest X-ray.  Blood tests. TREATMENT The main goals of treatment are to prevent blood clots from forming and to keep your heart beating at a normal rate and rhythm. The type of treatment that you receive depends on many factors, such as your underlying medical conditions and how you feel when you are experiencing atrial fibrillation. This condition may be treated with:  Medicine to slow down the heart rate, bring the heart's rhythm back to normal, or prevent clots from forming.  Electrical cardioversion. This is a procedure that resets your heart's rhythm by delivering a controlled, low-energy shock to the heart through your skin.  Different types of ablation, such as catheter ablation, catheter ablation with pacemaker, or surgical ablation. These procedures destroy the heart tissues that send abnormal  signals. When the pacemaker is used, it is placed under your skin to help your heart beat in a  regular rhythm. HOME CARE INSTRUCTIONS  Take over-the counter and prescription medicines only as told by your health care provider.  If your health care provider prescribed a blood-thinning medicine (anticoagulant), take it exactly as told. Taking too much blood-thinning medicine can cause bleeding. If you do not take enough blood-thinning medicine, you will not have the protection that you need against stroke and other problems.  Do not use tobacco products, including cigarettes, chewing tobacco, and e-cigarettes. If you need help quitting, ask your health care provider.  If you have obstructive sleep apnea, manage your condition as told by your health care provider.  Do not drink alcohol.  Do not drink beverages that contain caffeine, such as coffee, soda, and tea.  Maintain a healthy weight. Do not use diet pills unless your health care provider approves. Diet pills may make heart problems worse.  Follow diet instructions as told by your health care provider.  Exercise regularly as told by your health care provider.  Keep all follow-up visits as told by your health care provider. This is important. PREVENTION  Avoid drinking beverages that contain caffeine or alcohol.  Avoid certain medicines, especially medicines that are used for breathing problems.  Avoid certain herbs and herbal medicines, such as those that contain ephedra or ginseng.  Do not use illegal drugs, such as cocaine and amphetamines.  Do not smoke.  Manage your high blood pressure. SEEK MEDICAL CARE IF:  You notice a change in the rate, rhythm, or strength of your heartbeat.  You are taking an anticoagulant and you notice increased bruising.  You tire more easily when you exercise or exert yourself. SEEK IMMEDIATE MEDICAL CARE IF:  You have chest pain, abdominal pain, sweating, or weakness.  You feel nauseous.  You notice blood in your vomit, bowel movement, or urine.  You have shortness of  breath.  You suddenly have swollen feet and ankles.  You feel dizzy.  You have sudden weakness or numbness of the face, arm, or leg, especially on one side of the body.  You have trouble speaking, trouble understanding, or both (aphasia).  Your face or your eyelid droops on one side. These symptoms may represent a serious problem that is an emergency. Do not wait to see if the symptoms will go away. Get medical help right away. Call your local emergency services (911 in the [place] Do not drive yourself to the hospital.   This information is not intended to replace advice given to you by your health care provider. Make sure you discuss any questions you have with your health care provider.   Document Released: 07/29/2005 Document Revised: 04/19/2015 Document Reviewed: 11/23/2014 Elsevier Interactive Patient Education 2016 [place] on my medicine - XARELTO (Rivaroxaban)  This medication education was reviewed with me or my healthcare representative as part of my discharge preparation.  The pharmacist that spoke with me during my hospital stay was:  Brain Hilts, [facility]  Why was Xarelto prescribed for you? Xarelto was prescribed for you to reduce the risk of a blood clot forming that can cause a stroke if you have a medical condition called atrial fibrillation (a type of irregular heartbeat).  What do you need to know about xarelto ? Take your Xarelto ONCE DAILY at the same time every day with your evening meal. If you have difficulty swallowing the tablet whole, you  may crush it and mix in applesauce just prior to taking your dose.  Take Xarelto exactly as prescribed by your doctor and DO NOT stop taking Xarelto without talking to the doctor who prescribed the medication.  Stopping without other stroke prevention medication to take the place of Xarelto may increase your risk of developing a clot that causes a stroke.  Refill your prescription before you  run out.  After discharge, you should have regular check-up appointments with your healthcare provider that is prescribing your Xarelto.  In the future your dose may need to be changed if your kidney function or weight changes by a significant amount.  What do you do if you miss a dose? If you are taking Xarelto ONCE DAILY and you miss a dose, take it as soon as you remember on the same day then continue your regularly scheduled once daily regimen the next day. Do not take two doses of Xarelto at the same time or on the same day.   Important Safety Information A possible side effect of Xarelto is bleeding. You should call your healthcare provider right away if you experience any of the following: ? Bleeding from an injury or your nose that does not stop. ? Unusual colored urine (red or dark brown) or unusual colored stools (red or black). ? Unusual bruising for unknown reasons. ? A serious fall or if you hit your head (even if there is no bleeding).  Some medicines may interact with Xarelto and might increase your risk of bleeding while on Xarelto. To help avoid this, consult your healthcare provider or pharmacist prior to using any new prescription or non-prescription medications, including herbals, vitamins, non-steroidal anti-inflammatory drugs (NSAIDs) and supplements.  This website has more information on Xarelto: [email or web address]

## 2015-11-24 NOTE — Progress Notes (Signed)
Pt was sitting at the side of the bed, eating supper HR went up to 130's to  140's sustained, A.Fib , no c/o of chest pain, MD ordered lopressor 2.5 mg IV every 6 hours PRN for HR more than 120, HR went down to 110's after giving lopressor, Will continue to monitor.

## 2015-11-24 NOTE — NC FL2 (Signed)
[place] LEVEL OF CARE SCREENING TOOL     IDENTIFICATION  Patient Name: [name] Birthdate: 1941-07-01 Sex: female Admission Date (Current Location): 11/18/2015  [facility] and [place] Number:  Herbalist and Address:  [facility] [place], [place] [address] [place], [place] [address]      Provider Number: [id number]  Attending Physician Name and Address:  Robbie Lis, MD  Relative Name and Phone Number:  Horris Latino, daughter, [phone]    Current Level of Care: Hospital Recommended Level of Care: [place] Prior Approval Number:    Date Approved/Denied:   PASRR Number: [id number]  Discharge Plan: SNF    Current Diagnoses: Patient Active Problem List   Diagnosis Date Noted  . New onset atrial fibrillation ([place])   . Sinus bradycardia   . Acute respiratory failure with hypoxia ([place])   . Cardiogenic shock ([place])   . Paroxysmal atrial fibrillation ([place]) 11/19/2015  . Atrial fibrillation with RVR ([place]) 11/18/2015  . AKI (acute kidney injury) ([place]) 11/18/2015  . Metabolic encephalopathy [id number]  . Acute hyperkalemia 11/18/2015  . Abnormal urinalysis 11/18/2015  . SIRS (systemic inflammatory response syndrome) ([place]) 11/18/2015  . Hyperosmolar non-ketotic state in patient with type 2 diabetes mellitus ([place]) 11/18/2015  . Hyperkalemia   . Arterial hypotension   . Anemia 09/17/2014  . 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 Peripheral vascular disease ([place]) 08/24/2013  . 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 Obesity (BMI 30-39.9)   . Dehydration 01/27/2013  . CAD (coronary artery disease) 01/26/2013  . CKD (chronic kidney disease) stage 3, GFR 30-59 ml/min 01/26/2013  . Type 2 diabetes mellitus with vascular disease ([place])   . HTN (hypertension) 11/14/2012  . History of bladder cancer     Orientation RESPIRATION BLADDER Height & Weight     Self, Time, Situation,  Place  O2 (3L/min) Continent, Urostomy Weight: 198 lb 6.6 oz (90 kg) Height:     BEHAVIORAL SYMPTOMS/MOOD NEUROLOGICAL BOWEL NUTRITION STATUS      Continent  (Please see DC summary)  AMBULATORY STATUS COMMUNICATION OF NEEDS Skin   Extensive Assist Verbally Normal                       Personal Care Assistance Level of Assistance  Bathing, Feeding, Dressing Bathing Assistance: Maximum assistance Feeding assistance: Independent Dressing Assistance: Maximum assistance     Functional Limitations Info             SPECIAL CARE FACTORS FREQUENCY  PT (By licensed PT)     PT Frequency: min 3x/week              Contractures      Additional Factors Info  Code Status, Allergies, Insulin Sliding Scale Code Status Info: Full Allergies Info: Gabapentin, Lisinopril, Metformin And Related, Biaxin   Insulin Sliding Scale Info: insulin aspart (novoLOG) injection 0-20 Units;insulin glargine (LANTUS) injection 20 Units;       Current Medications (11/24/2015):  This is the current hospital active medication list Current Facility-Administered Medications  Medication Dose Route Frequency Provider Last Rate Last Dose  . 0.9 %  sodium chloride infusion   Intravenous Continuous Hewitt Shorts Harduk, PA-C   Stopped at 11/21/15 1239  . acetaminophen (TYLENOL) tablet 650 mg  650 mg Oral Q6H PRN Samella Parr, NP   650 mg at 11/23/15 1142   Or  . acetaminophen (TYLENOL) suppository 650 mg  650 mg Rectal Q6H PRN Samella Parr, NP      . amiodarone (PACERONE) tablet 200 mg  200 mg Oral BID [place], PA-C   200 mg at 11/24/15 1018  . antiseptic oral rinse (CPC / CETYLPYRIDINIUM CHLORIDE 0.05%) solution 7 mL  7 mL Mouth Rinse BID Rush Farmer, MD   7 mL at 11/24/15 1018  . aspirin chewable tablet 81 mg  81 mg Oral Daily Rush Farmer, MD   81 mg at 11/24/15 1017  . atorvastatin (LIPITOR) tablet 80 mg  80 mg Oral q1800 Sueanne Margarita, MD   80 mg at 11/23/15 1809  . bisacodyl  (DULCOLAX) suppository 10 mg  10 mg Rectal Daily Robbie Lis, MD   10 mg at 11/24/15 1429  . feeding supplement (GLUCERNA SHAKE) (GLUCERNA SHAKE) liquid 237 mL  237 mL Oral BID BM Rush Farmer, MD   237 mL at 11/24/15 1343  . fentaNYL (SUBLIMAZE) injection 25 mcg  25 mcg Intravenous Once Anders Simmonds, MD   25 mcg at 11/23/15 0000  . insulin aspart (novoLOG) injection 0-20 Units  0-20 Units Subcutaneous Q4H Hewitt Shorts Harduk, PA-C   7 Units at 11/24/15 1339  . insulin glargine (LANTUS) injection 20 Units  20 Units Subcutaneous BID Bethena Roys, MD   20 Units at 11/24/15 1017  . levalbuterol (XOPENEX) nebulizer solution 1.25 mg  1.25 mg Nebulization Q6H PRN Hewitt Shorts Hard[place], PA-C   1.25 mg at 11/22/15 1155  . off the beat book   Does not apply Once Vern Claude, Therapist, sports      . ondansetron (ZOFRAN) tablet 4 mg  4 mg Oral Q6H PRN Samella Parr, NP       Or  . ondansetron [facility]) injection 4 mg  4 mg Intravenous Q6H PRN Samella Parr, NP   4 mg at 11/21/15 0757  . pantoprazole (PROTONIX) injection 40 mg  40 mg Intravenous Q24H Anders Simmonds, MD   40 mg at 11/23/15 2114  . phenylephrine (NEO-SYNEPHRINE) 40 mg in dextrose 5 % 250 mL (0.16 mg/mL) infusion  0-400 mcg/min Intravenous Titrated Rush Farmer, MD   Stopped at 11/21/15 2217  . polyethylene glycol (MIRALAX / GLYCOLAX) packet 17 g  17 g Oral Daily Robbie Lis, MD      . Derrill Memo ON 11/25/2015] rivaroxaban (XARELTO) tablet 20 mg  20 mg Oral Q lunch Robbie Lis, MD       Followed by  . [START ON 11/26/2015] rivaroxaban (XARELTO) tablet 20 mg  20 mg Oral Q supper Robbie Lis, MD      . sodium chloride flush (NS) 0.9 % injection 10-40 mL  10-40 mL Intracatheter Q12H Rush Farmer, MD   10 mL at 11/24/15 1018  . sodium chloride flush (NS) 0.9 % injection 10-40 mL  10-40 mL Intracatheter PRN Rush Farmer, MD      . sodium chloride flush (NS) 0.9 % injection 3 mL  3 mL Intravenous Q12H Samella Parr, NP   3 mL at 11/24/15 1018      Discharge Medications: Please see discharge summary for a list of discharge medications.  Relevant Imaging Results:  Relevant Lab Results:   Additional Information SSN: [place], [place]

## 2015-11-24 NOTE — Progress Notes (Addendum)
Patient ID: [name], female   DOB: [birth year], 75 y.o.   MRN: [id number]  PROGRESS NOTE    [name]  GUY:[id number] DOB: March 08, 1941 DOA: 11/18/2015  PCP: Gerrit Heck, MD  Outpatient Specialists: Dr. Curt Jews, vascular surgery   Brief Narrative:  75 year old female with PMH of CAD and bladder Ca s/p resection, admitted 4/8 for UTI/AF RVR. Pt subsequently developed progressive dyspnea attributed to CHF in setting AF. Pt then converted to junctional brady. On 4/10, pt suffered respiratory arrest requiring intubation.  Care transitioned to [facility] starting 11/24/2015.  Assessment & Plan:  Acute hypoxemic / hypercarbic respiratory failure secondary to pulmonary edema  - As mentioned above, pt intubated 4/10; now respiratory status stable, off ventilator - Diureses initiated under PCCM care - Currently now on lasix - Stable respiratory status   A-fib with RVR  - CHADS vasc score 4 - On anticoagulation with xarelto - Rate controlled with amiodarone  Acute diastolic CHF / CAD s/p stent to LCX - Diuresed with lasix per PCCM - Now off of lasix - Stable respiratory status  - Echo on this admission showed EF 65-70%.  Hyperkalemia - Given Lasix 40 mg IV q6 x3 doses. 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 - Continue Lantus 20 units BID and SSI - CBG's in past 24 hours: 138, 102, 224  Dyslipidemia associated with type 2 DM - Continue statin therapy   Acute metabolic encephalopathy - In the setting on acute resp failure - Monitor - Improving mentals status   DVT prophylaxis: on AC with xarelto  Code Status: full code  Family Communication: no family at the bedside  this am  Disposition Plan: remains in SDU due to tachycardia, tachypnea   Consultants:   Cardiology  Procedures:   Intubations 11/20/2015 --> 11/22/2015  Left IJ line 11/20/2015   Antimicrobials:   Rocephin 4/8 >4/10  Cefepime 4/10>>>4/13  Vancomycin 4/8 >4/13   Subjective: No overnight events. No respiratory distress.  Objective: Filed Vitals:   11/23/15 2359 11/24/15 0428 11/24/15 0821 11/24/15 1304  BP: 157/82 134/59 141/71 151/57  Pulse: 99 100 96 111  Temp: 99.3 F (37.4 C) 99 F (37.2 C) 98.4 F (36.9 C) 98.4 F (36.9 C)  TempSrc: Oral Oral Oral Oral  Resp: '17 21 15 23  '$ Weight:  90 kg (198 lb 6.6 oz)    SpO2: 97% 94% 96% 94%    Intake/Output Summary (Last 24 hours) at 11/24/15 1621 Last data filed at 11/24/15 0700  Gross per 24 hour  Intake  635.5 ml  Output   3300 ml  Net -2664.5 ml   Filed Weights   11/22/15 0500 11/23/15 0500 11/24/15 0428  Weight: 97.9 kg (215 lb 13.3 oz) 94.8 kg (208 lb 15.9 oz) 90 kg (198 lb 6.6 oz)    Examination:  General exam: Appears calm and comfortable  Respiratory system: Clear to auscultation. Respiratory effort normal. Cardiovascular system: S1 & S2 heard, Rate controlled. No JVD, murmurs, rubs, gallops or clicks. No pedal edema. Gastrointestinal system: Abdomen is nondistended, soft and nontender. No organomegaly or masses felt. Normal bowel sounds heard. Central nervous system: Alert. No focal neurological deficits.  Extremities: Symmetric 5 x 5 power. Skin: No rashes, lesions or ulcers Psychiatry: Judgement and insight appear normal. Mood & affect appropriate.   Data Reviewed: I have personally reviewed following labs and imaging studies  CBC:  Recent Labs Lab 11/18/15 0409  11/20/15 1045 11/21/15 0335 11/22/15 0320 11/23/15 0440 11/24/15 0520  WBC 7.8  < > 10.5 9.2 9.0 9.8 7.5  NEUTROABS 5.9  --  9.4*  --   --   --   --   HGB 10.6*  < > 10.0* 9.2* 9.1* 9.4* 9.5*  HCT 34.2*  < > 32.7* 28.4* 28.3*  29.8* 30.9*  MCV 83.4  < > 84.5 79.1 80.4 81.9 83.1  PLT 185  < > 276 240 253 290 304  < > = values in this interval not displayed. Basic Metabolic Panel:  Recent Labs Lab 11/20/15 1045  11/21/15 1244 11/22/15 0320 11/23/15 0015 11/23/15 0440 11/24/15 0520  NA 132*  < > 136 136 137 140 141  K 6.0*  < > 3.4* 4.1 3.4* 4.1 4.5  CL 109  < > 103 104 97* 101 100*  CO2 15*  < > 21* '23 27 28 31  '$ GLUCOSE 374*  < > 184* 270* 268* 148* 142*  BUN 49*  < > 40* 35* 26* 27* 23*  CREATININE 1.75*  < > 1.35* 1.22* 0.97 0.98 1.12*  CALCIUM 8.7*  < > 9.0 8.9 8.9 9.2 9.6  MG 1.7  < > 1.3* 1.6* 1.8 2.0 1.5*  PHOS 5.4*  --  2.0* 2.0*  --  4.4 2.8  < > = values in this interval not displayed. GFR: Estimated Creatinine Clearance: 46 mL/min (by C-G formula based on Cr of 1.12). Liver Function Tests:  Recent Labs Lab 11/19/15 0445 11/20/15 1045  AST 37 54*  ALT 54 57*  ALKPHOS 146* 168*  BILITOT 0.5 0.5  PROT 5.6* 5.7*  ALBUMIN 1.9* 1.9*   No results for input(s): LIPASE, AMYLASE in the last 168 hours.  Recent Labs Lab 11/18/15 0841  AMMONIA 18   Coagulation Profile: No results for input(s): INR, PROTIME in the last 168 hours. Cardiac Enzymes:  Recent Labs Lab 11/18/15 1614 11/18/15 1942 11/20/15 1045  TROPONINI 0.08* 0.07* <0.03   BNP (last 3 results) No results for input(s): PROBNP in the last 8760 hours. HbA1C: No results for input(s): HGBA1C in the last 72 hours. CBG:  Recent Labs Lab 11/23/15 1924 11/24/15 0004 11/24/15 0436 11/24/15 0820 11/24/15 1301  GLUCAP 203* 159* 138* 102* 224*   Lipid Profile: No results for input(s): CHOL, HDL, LDLCALC, TRIG, CHOLHDL, LDLDIRECT in the last 72 hours. Thyroid Function Tests: No results for input(s): TSH, T4TOTAL, FREET4, T3FREE, THYROIDAB in the last 72 hours. Anemia Panel: No results for input(s): VITAMINB12, FOLATE, FERRITIN, TIBC, IRON, RETICCTPCT in the last 72 hours. Urine analysis:    Component Value Date/Time     COLORURINE YELLOW 11/18/2015 0343   APPEARANCEUR HAZY* 11/18/2015 0343   LABSPEC 1.015 11/18/2015 0343   PHURINE 6.5 11/18/2015 0343   GLUCOSEU NEGATIVE 11/18/2015 0343   HGBUR LARGE* 11/18/2015 0343   BILIRUBINUR SMALL* 11/18/2015 0343   KETONESUR NEGATIVE 11/18/2015 0343   PROTEINUR 100* 11/18/2015 0343   UROBILINOGEN 0.2 09/12/2014 0949   NITRITE NEGATIVE 11/18/2015 0343   LEUKOCYTESUR MODERATE* 11/18/2015 0343   Sepsis Labs: ('@LABRCNTIP'$ (procalcitonin:4,lacticidven:4)  Recent Results (from the past 240 hour(s))  Urine culture     Status: None   Collection Time: 11/15/15  1:35 PM  Result  Value Ref Range Status   Specimen Description URINE, CATHETERIZED  Final   Special Requests NONE  Final   Culture MULTIPLE SPECIES PRESENT, SUGGEST RECOLLECTION  Final   Report Status 11/16/2015 FINAL  Final  Culture, blood (Routine X 2) w Reflex to ID Panel     Status: None   Collection Time: 11/18/15  3:23 AM  Result Value Ref Range Status   Specimen Description BLOOD RIGHT WRIST  Final   Special Requests   Final    BOTTLES DRAWN AEROBIC AND ANAEROBIC 5CC AER [place] ANA   Culture NO GROWTH 5 DAYS  Final   Report Status 11/23/2015 FINAL  Final  Culture, blood (Routine X 2) w Reflex to ID Panel     Status: Abnormal   Collection Time: 11/18/15  3:25 AM  Result Value Ref Range Status   Specimen Description BLOOD LEFT WRIST  Final   Special Requests IN PEDIATRIC BOTTLE 6CC  Final   Culture  Setup Time   Final    GRAM POSITIVE COCCI IN CLUSTERS IN PEDIATRIC BOTTLE CRITICAL RESULT CALLED TO, READ BACK BY AND VERIFIED WITH: Nicholes Calamity, [facility]    Culture (A)  Final    STAPHYLOCOCCUS SPECIES (COAGULASE NEGATIVE) THE SIGNIFICANCE OF ISOLATING THIS ORGANISM FROM A SINGLE SET OF BLOOD CULTURES WHEN MULTIPLE SETS ARE DRAWN IS UNCERTAIN. PLEASE NOTIFY THE MICROBIOLOGY DEPARTMENT WITHIN ONE WEEK IF SPECIATION AND SENSITIVITIES ARE REQUIRED.    Report Status 11/21/2015 FINAL  Final   Urine culture     Status: None   Collection Time: 11/18/15  4:58 AM  Result Value Ref Range Status   Specimen Description URINE, RANDOM  Final   Special Requests BAG(PED)  Final   Culture MULTIPLE SPECIES PRESENT, SUGGEST RECOLLECTION  Final   Report Status 11/19/2015 FINAL  Final  MRSA PCR Screening     Status: None   Collection Time: 11/18/15  2:42 PM  Result Value Ref Range Status   MRSA by PCR NEGATIVE NEGATIVE Final    Comment:        The GeneXpert MRSA Assay (FDA approved for NASAL specimens only), is one component of a comprehensive MRSA colonization surveillance program. It is not intended to diagnose MRSA infection nor to guide or monitor treatment for MRSA infections.       Radiology Studies: No results found.      Scheduled Meds: . amiodarone  200 mg Oral BID  . antiseptic oral rinse  7 mL Mouth Rinse BID  . aspirin  81 mg Oral Daily  . atorvastatin  80 mg Oral q1800  . bisacodyl  10 mg Rectal Daily  . feeding supplement (GLUCERNA SHAKE)  237 mL Oral BID BM  . fentaNYL (SUBLIMAZE) injection  25 mcg Intravenous Once  . insulin aspart  0-20 Units Subcutaneous Q4H  . insulin glargine  20 Units Subcutaneous BID  . off the beat book   Does not apply Once  . pantoprazole (PROTONIX) IV  40 mg Intravenous Q24H  . polyethylene glycol  17 g Oral Daily  . [START ON 11/25/2015] rivaroxaban  20 mg Oral Q lunch   Followed by  . [START ON 11/26/2015] rivaroxaban  20 mg Oral Q supper  . sodium chloride flush  10-40 mL Intracatheter Q12H  . sodium chloride flush  3 mL Intravenous Q12H   Continuous Infusions: . sodium chloride Stopped (11/21/15 1239)  . phenylephrine (NEO-SYNEPHRINE) Adult infusion Stopped (11/21/15 2217)     LOS: 6 days   Time  spent: 25 minutes   Leisa Lenz, MD [facility] Pager [phone]  If 7PM-7AM, please contact night-coverage [email or web address] Password [email or web address] 11/24/2015, 4:21 PM

## 2015-11-24 NOTE — Clinical Social Work Placement (Signed)
   CLINICAL SOCIAL WORK PLACEMENT  NOTE  Date:  11/24/2015  Patient Details  Name: [name] MRN: [id number] Date of Birth: 26-Dec-1940  Clinical Social Work is seeking post-discharge placement for this patient at the [place] level of care (*CSW will initial, date and re-position this form in  chart as items are completed):      Patient/family provided with [facility] Work Department's list of facilities offering this level of care within the geographic area requested by the patient (or if unable, by the patient's family).      Patient/family informed of their freedom to choose among providers that offer the needed level of care, that participate in Medicare, Medicaid or managed care program needed by the patient, have an available bed and are willing to accept the patient.      Patient/family informed of [facility]'s ownership interest in [facility] and [facility], as well as of the fact that they are under no obligation to receive care at these facilities.  PASRR submitted to EDS on       PASRR number received on       Existing PASRR number confirmed on 11/24/15     FL2 transmitted to all facilities in geographic area requested by pt/family on 11/24/15     FL2 transmitted to all facilities within larger geographic area on       Patient informed that his/her managed care company has contracts with or will negotiate with certain facilities, including the following:            Patient/family informed of bed offers received.  Patient chooses bed at       Physician recommends and patient chooses bed at      Patient to be transferred to   on  .  Patient to be transferred to facility by       Patient family notified on   of transfer.  Name of family member notified:        PHYSICIAN Please sign FL2     Additional Comment:    _______________________________________________ Benard Halsted, [place] 11/24/2015, 3:22 PM

## 2015-11-24 NOTE — Progress Notes (Signed)
SUBJECTIVE: The patient continues to feel improved.  No CP, no overt SOB, no palpitations.  Marland Kitchen antiseptic oral rinse  7 mL Mouth Rinse BID  . aspirin  81 mg Oral Daily  . atorvastatin  80 mg Oral q1800  . feeding supplement (GLUCERNA SHAKE)  237 mL Oral BID BM  . fentaNYL (SUBLIMAZE) injection  25 mcg Intravenous Once  . insulin aspart  0-20 Units Subcutaneous Q4H  . insulin glargine  20 Units Subcutaneous BID  . off the beat book   Does not apply Once  . pantoprazole (PROTONIX) IV  40 mg Intravenous Q24H  . sodium chloride flush  10-40 mL Intracatheter Q12H  . sodium chloride flush  3 mL Intravenous Q12H   . sodium chloride Stopped (11/21/15 1239)  . amiodarone 30 mg/hr (11/23/15 2325)  . heparin 1,300 Units/hr (11/23/15 2325)  . phenylephrine (NEO-SYNEPHRINE) Adult infusion Stopped (11/21/15 2217)    OBJECTIVE: Physical Exam: Filed Vitals:   11/23/15 2045 11/23/15 2359 11/24/15 0428 11/24/15 0821  BP: 99/59 157/82 134/59 141/71  Pulse: 103 99 100 96  Temp: 99.1 F (37.3 C) 99.3 F (37.4 C) 99 F (37.2 C) 98.4 F (36.9 C)  TempSrc: Oral Oral Oral Oral  Resp: 21 17 21 15   Weight:   198 lb 6.6 oz (90 kg)   SpO2: 95% 97% 94% 96%    Intake/Output Summary (Last 24 hours) at 11/24/15 0850 Last data filed at 11/24/15 0700  Gross per 24 hour  Intake 1003.1 ml  Output   5125 ml  Net -4121.9 ml    Telemetry reveals SR, 80's  GEN- The patient is AAO x3, appears in NAD Head- normocephalic, atraumatic Eyes-  Sclera clear, conjunctiva pink Ears- hearing intact Lungs- normal work of breathing, decreasing crackles b/l Heart- RRR, no significant murmurs, no rubs or gallops GI- soft, NT, ND, urostomy tube in place Extremities- 1++ edema , LLE, L great toe missing, chronic looking skin changes, RLE BKA,  Skin- no rash or lesion Psych- euthymic mood, full affect Neuro- no gross deficits appreciated  LABS: Basic Metabolic Panel:  Recent Labs  11/23/15 0440  11/24/15 0520  NA 140 141  K 4.1 4.5  CL 101 100*  CO2 28 31  GLUCOSE 148* 142*  BUN 27* 23*  CREATININE 0.98 1.12*  CALCIUM 9.2 9.6  MG 2.0 1.5*  PHOS 4.4 2.8   CBC:  Recent Labs  11/23/15 0440 11/24/15 0520  WBC 9.8 7.5  HGB 9.4* 9.5*  HCT 29.8* 30.9*  MCV 81.9 83.1  PLT 290 304   Cardiac Enzymes: No results for input(s): CKTOTAL, CKMB, CKMBINDEX, TROPONINI in the last 72 hours.   ASSESSMENT AND PLAN:  1.  New PAFib, Tachy-brady, suspect secondary to acute medical issues, metabolic derangement, respiratory failure.   remains in SR this morning, [name] change amiodarone to PO now with PO diet      CHA2DS2Vasc is at least 4 on heparin gtt (pharmacy managing), [name] change to Xarelto      BP remains stable      CBC/plts stable She has no known prior history of AFib, possibly a consequence of her other medical issues this admit, would continue her amiodarone and anticoagulation and she [name] need f/u with her primary cardiologist post discharge (Dr. Marlou Porch) to f/u and decide long-term needs.  2. Respiratory failure      Extubated yesterday  still remains fluid + overall, but neg another -4392 ml yesterday ( 3 day negative - 8140ml)  Change to PO diuresis and follow ongoing response  3. CAD  No reports of CP     Trop I  0.08,0.07, 0.03  4. UTI  Urostomy tube  Hx of bladder cancer  mgmt with IM service  5. Encephalopathy, multifactorial  resolved     Admit note suspected multifactorial with dehydration, infection, DM ooc, possibly polypharmacy with recent fill of 2 narcotic presriptions  6. Hyperkalemia, CRI  all improved, continue with IM and critical care  7. DM OOC     Better, continue with medicine service  8. Posterior neck pain, headaches     Patient reports chronic but worse in the last few months with headaches was pending to see specialist     [name] defer pain management to medicine service  [name] Standard, PA-C 11/24/2015  8:50 AM    I have seen and examined this patient with [name].  Agree with above, note added to reflect my findings.  On exam, regular rhythm, no murmurs, lungs clear. In sinus rhythm today on amiodarone GGT.  Taking oral medications so [name] switch her to PO.  She should take 200 mg BID for one week then 200 mg per day.  She [name] also need to be anticoagulated, would start her on Xarelto.  Follow up in cardiology clinic in one month.    [name] M. [name] Gange MD 11/24/2015 12:56 PM

## 2015-11-24 NOTE — Care Management Important Message (Signed)
Important Message  Patient Details  Name: [name] MRN: [id number] Date of Birth: 1941-01-09   Medicare Important Message Given:  Yes    Nathen May 11/24/2015, 10:57 AM

## 2015-11-25 DIAGNOSIS — E875 Hyperkalemia: Secondary | ICD-10-CM | POA: Diagnosis not present

## 2015-11-25 DIAGNOSIS — J9601 Acute respiratory failure with hypoxia: Secondary | ICD-10-CM | POA: Diagnosis not present

## 2015-11-25 DIAGNOSIS — N183 Chronic kidney disease, stage 3 (moderate): Secondary | ICD-10-CM | POA: Diagnosis not present

## 2015-11-25 DIAGNOSIS — E1121 Type 2 diabetes mellitus with diabetic nephropathy: Secondary | ICD-10-CM | POA: Diagnosis not present

## 2015-11-25 DIAGNOSIS — I10 Essential (primary) hypertension: Secondary | ICD-10-CM | POA: Diagnosis not present

## 2015-11-25 DIAGNOSIS — I4891 Unspecified atrial fibrillation: Secondary | ICD-10-CM | POA: Diagnosis not present

## 2015-11-25 LAB — BASIC METABOLIC PANEL
Anion gap: 11 (ref 5–15)
BUN: 25 mg/dL — ABNORMAL HIGH (ref 6–20)
CHLORIDE: 97 mmol/L — AB (ref 101–111)
CO2: 32 mmol/L (ref 22–32)
Calcium: 9.9 mg/dL (ref 8.9–10.3)
Creatinine, Ser: 1.25 mg/dL — ABNORMAL HIGH (ref 0.44–1.00)
GFR calc non Af Amer: 41 mL/min — ABNORMAL LOW (ref >60)
GFR, EST AFRICAN AMERICAN: 48 mL/min — AB (ref >60)
Glucose, Bld: 167 mg/dL — ABNORMAL HIGH (ref 65–99)
POTASSIUM: 4.4 mmol/L (ref 3.5–5.1)
SODIUM: 140 mmol/L (ref 135–145)

## 2015-11-25 LAB — GLUCOSE, CAPILLARY
GLUCOSE-CAPILLARY: 149 mg/dL — AB (ref 65–99)
GLUCOSE-CAPILLARY: 164 mg/dL — AB (ref 65–99)
GLUCOSE-CAPILLARY: 301 mg/dL — AB (ref 65–99)
Glucose-Capillary: 228 mg/dL — ABNORMAL HIGH (ref 65–99)
Glucose-Capillary: 249 mg/dL — ABNORMAL HIGH (ref 65–99)

## 2015-11-25 LAB — CBC
HEMATOCRIT: 32.4 % — AB (ref 36.0–46.0)
HEMOGLOBIN: 10.2 g/dL — AB (ref 12.0–15.0)
MCH: 26.1 pg (ref 26.0–34.0)
MCHC: 31.5 g/dL (ref 30.0–36.0)
MCV: 82.9 fL (ref 78.0–100.0)
Platelets: 347 10*3/uL (ref 150–400)
RBC: 3.91 MIL/uL (ref 3.87–5.11)
RDW: 14.8 % (ref 11.5–15.5)
WBC: 8.1 10*3/uL (ref 4.0–10.5)

## 2015-11-25 LAB — MAGNESIUM: MAGNESIUM: 1.8 mg/dL (ref 1.7–2.4)

## 2015-11-25 MED ORDER — SODIUM BICARBONATE 650 MG PO TABS
1300.0000 mg | ORAL_TABLET | Freq: Two times a day (BID) | ORAL | Status: DC
  Administered 2015-11-25 – 2015-11-27 (×4): 1300 mg via ORAL
  Filled 2015-11-25 (×4): qty 2

## 2015-11-25 MED ORDER — ASPIRIN EC 81 MG PO TBEC: 81.0000 mg | DELAYED_RELEASE_TABLET | Freq: Every evening | ORAL | Status: DC

## 2015-11-25 MED ORDER — SENNA 8.6 MG PO TABS
1.0000 | ORAL_TABLET | Freq: Every day | ORAL | Status: DC
  Administered 2015-11-25 – 2015-11-27 (×3): 8.6 mg via ORAL
  Filled 2015-11-25 (×3): qty 1

## 2015-11-25 MED ORDER — INSULIN ASPART 100 UNIT/ML ~~LOC~~ SOLN
0.0000 [IU] | Freq: Three times a day (TID) | SUBCUTANEOUS | Status: DC
  Administered 2015-11-25: 4 [IU] via SUBCUTANEOUS
  Administered 2015-11-25: 7 [IU] via SUBCUTANEOUS
  Administered 2015-11-25: 15 [IU] via SUBCUTANEOUS
  Administered 2015-11-26: 7 [IU] via SUBCUTANEOUS
  Administered 2015-11-26 (×2): 11 [IU] via SUBCUTANEOUS
  Administered 2015-11-27 (×2): 15 [IU] via SUBCUTANEOUS

## 2015-11-25 NOTE — Progress Notes (Addendum)
Patient ID: [name], female   DOB: 1941-01-31, 75 y.o.   MRN: [id number]  PROGRESS NOTE    ANDRESHA MCCRACKIN  [id number] DOB: Nov 25, 1940 DOA: 11/18/2015  PCP: Gerrit Heck, MD  Outpatient Specialists: Dr. Curt Jews, vascular surgery   Brief Narrative:  75 year old female with PMH of CAD and bladder Ca s/p resection, admitted 4/8 for UTI/AF RVR. Pt subsequently developed progressive dyspnea attributed to CHF in setting AF. Pt then converted to junctional brady. On 4/1, pt suffered respiratory arrest requiring intubation.  Care transitioned to [facility] starting 11/24/2015.  Assessment & Plan:  Acute hypoxemic / hypercarbic respiratory failure secondary to pulmonary edema  - As mentioned above, pt intubated 4/10; now respiratory status stable, off ventilator - Diureses initiated under PCCM care - Continue lasix 40 mg BID - Stable respiratory status   A-fib with RVR  - CHADS vasc score 4 - Continue anticoagulation with xarelto - Rate controlled with amiodarone  Acute diastolic CHF / CAD s/p stent to LCX - Continue lasix 40 gm BID - Echo on this admission showed EF 65-70%.  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 - Continue Lantus 20 units BID and SSI    Dyslipidemia associated with type 2 DM - Continue statin therapy   Acute metabolic encephalopathy - In the setting on acute resp failure - Much better mental status this am   DVT prophylaxis: on AC with xarelto  Code Status: full code  Family Communication: no family at the bedside this am  Disposition Plan: transfer to telemetry floor today    Consultants:   Cardiology  Procedures:   Intubations 11/20/2015 -->  11/22/2015  Left IJ line 11/20/2015   Antimicrobials:   Rocephin 4/8 >4/10  Cefepime 4/10>>>4/13  Vancomycin 4/8 >4/13   Subjective: No respiratory distress. Feels better.  Objective: Filed Vitals:   11/25/15 0500 11/25/15 0600 11/25/15 0800 11/25/15 1222  BP:  148/72 148/71 155/88  Pulse: 107 96 98 98  Temp: 99 F (37.2 C)  99 F (37.2 C) 98.4 F (36.9 C)  TempSrc: Oral  Oral Oral  Resp: 9 17 17 20   Height:      Weight:      SpO2: 93% 95% 95% 99%    Intake/Output Summary (Last 24 hours) at 11/25/15 1412 Last data filed at 11/25/15 1100  Gross per 24 hour  Intake   1216 ml  Output   4475 ml  Net  -3259 ml   Filed Weights   11/23/15 0500 11/24/15 0428 11/25/15 0404  Weight: 94.8 kg (208 lb 15.9 oz) 90 kg (198 lb 6.6 oz) 87.136 kg (192 lb 1.6 oz)    Examination:  General exam: Appears in no distress Respiratory system: Clear to auscultation bilaterally. No wheezing. Cardiovascular system: S1 & S2 (+) , Rate controlled. No pedal edema. Gastrointestinal system: Nondistended, soft, nontender.(+) bowel sounds  Central nervous system: Alert. Nonfocal  Extremities: Symmetric 5 x 5 power. No cyanosis, no tenderness  Skin: No rashes, skin warm and dry  Psychiatry: Judgement and insight appear normal. Mood & affect nrmal.   Data Reviewed: I have personally reviewed following labs and imaging studies  CBC:  Recent Labs Lab 11/20/15 1045 11/21/15 0335 11/22/15 0320 11/23/15 0440 11/24/15 0520 11/25/15 0432  WBC 10.5 9.2 9.0 9.8 7.5 8.1  NEUTROABS 9.4*  --   --   --   --   --   HGB 10.0* 9.2* 9.1* 9.4* 9.5* 10.2*  HCT 32.7* 28.4* 28.3* 29.8* 30.9* 32.4*  MCV 84.5 79.1 80.4 81.9 83.1 82.9  PLT 276 240 253 290 304 [id number]   Basic Metabolic Panel:  Recent Labs Lab 11/20/15 1045  11/21/15 1244 11/22/15 0320 11/23/15 0015 11/23/15 0440 11/24/15 0520 11/25/15 0432  NA 132*  < > 136 136 137 140 141 140  K 6.0*  < > 3.4* 4.1 3.4* 4.1 4.5 4.4  CL 109  < > 103  104 97* 101 100* 97*  CO2 15*  < > 21* 23 27 28 31  32  GLUCOSE 374*  < > 184* 270* 268* 148* 142* 167*  BUN 49*  < > 40* 35* 26* 27* 23* 25*  CREATININE 1.75*  < > 1.35* 1.22* 0.97 0.98 1.12* 1.25*  CALCIUM 8.7*  < > 9.0 8.9 8.9 9.2 9.6 9.9  MG 1.7  < > 1.3* 1.6* 1.8 2.0 1.5* 1.8  PHOS 5.4*  --  2.0* 2.0*  --  4.4 2.8  --   < > = values in this interval not displayed. GFR: Estimated Creatinine Clearance: 40.9 mL/min (by C-G formula based on Cr of 1.25). Liver Function Tests:  Recent Labs Lab 11/19/15 0445 11/20/15 1045  AST 37 54*  ALT 54 57*  ALKPHOS 146* 168*  BILITOT 0.5 0.5  PROT 5.6* 5.7*  ALBUMIN 1.9* 1.9*   No results for input(s): LIPASE, AMYLASE in the last 168 hours. No results for input(s): AMMONIA in the last 168 hours. Coagulation Profile: No results for input(s): INR, PROTIME in the last 168 hours. Cardiac Enzymes:  Recent Labs Lab 11/18/15 1614 11/18/15 1942 11/20/15 1045  TROPONINI 0.08* 0.07* <0.03   BNP (last 3 results) No results for input(s): PROBNP in the last 8760 hours. HbA1C: No results for input(s): HGBA1C in the last 72 hours. CBG:  Recent Labs Lab 11/24/15 0820 11/24/15 1301 11/24/15 1631 11/24/15 2037 11/25/15 0442  GLUCAP 102* 224* 245* 219* 149*   Lipid Profile: No results for input(s): CHOL, HDL, LDLCALC, TRIG, CHOLHDL, LDLDIRECT in the last 72 hours. Thyroid Function Tests: No results for input(s): TSH, T4TOTAL, FREET4, T3FREE, THYROIDAB in the last 72 hours. Anemia Panel: No results for input(s): VITAMINB12, FOLATE, FERRITIN, TIBC, IRON, RETICCTPCT in the last 72 hours. Urine analysis:    Component Value Date/Time   COLORURINE YELLOW 11/18/2015 0343   APPEARANCEUR HAZY* 11/18/2015 0343   LABSPEC 1.015 11/18/2015 0343   PHURINE 6.5 11/18/2015 0343   GLUCOSEU NEGATIVE 11/18/2015 0343   HGBUR LARGE* 11/18/2015 0343   BILIRUBINUR SMALL* 11/18/2015 0343   KETONESUR NEGATIVE 11/18/2015 0343   PROTEINUR 100* 11/18/2015  0343   UROBILINOGEN 0.2 09/12/2014 0949   NITRITE NEGATIVE 11/18/2015 0343   LEUKOCYTESUR MODERATE* 11/18/2015 0343   Sepsis Labs: (@LABRCNTIP (procalcitonin:4,lacticidven:4)  Recent Results (from the past 240 hour(s))  Culture, blood (Routine X 2) w Reflex to ID Panel     Status: None   Collection Time: 11/18/15  3:23 AM  Result Value Ref Range Status   Specimen Description BLOOD RIGHT WRIST  Final   Special Requests   Final    BOTTLES DRAWN AEROBIC AND ANAEROBIC 5CC AER [place] ANA   Culture NO GROWTH 5 DAYS  Final  Report Status 11/23/2015 FINAL  Final  Culture, blood (Routine X 2) w Reflex to ID Panel     Status: Abnormal   Collection Time: 11/18/15  3:25 AM  Result Value Ref Range Status   Specimen Description BLOOD LEFT WRIST  Final   Special Requests IN PEDIATRIC BOTTLE 6CC  Final   Culture  Setup Time   Final    GRAM POSITIVE COCCI IN CLUSTERS IN PEDIATRIC BOTTLE CRITICAL RESULT CALLED TO, READ BACK BY AND VERIFIED WITH: Nicholes Calamity, [facility]    Culture (A)  Final    STAPHYLOCOCCUS SPECIES (COAGULASE NEGATIVE) THE SIGNIFICANCE OF ISOLATING THIS ORGANISM FROM A SINGLE SET OF BLOOD CULTURES WHEN MULTIPLE SETS ARE DRAWN IS UNCERTAIN. PLEASE NOTIFY THE MICROBIOLOGY DEPARTMENT WITHIN ONE WEEK IF SPECIATION AND SENSITIVITIES ARE REQUIRED.    Report Status 11/21/2015 FINAL  Final  Urine culture     Status: None   Collection Time: 11/18/15  4:58 AM  Result Value Ref Range Status   Specimen Description URINE, RANDOM  Final   Special Requests BAG(PED)  Final   Culture MULTIPLE SPECIES PRESENT, SUGGEST RECOLLECTION  Final   Report Status 11/19/2015 FINAL  Final  MRSA PCR Screening     Status: None   Collection Time: 11/18/15  2:42 PM  Result Value Ref Range Status   MRSA by PCR NEGATIVE NEGATIVE Final    Comment:        The GeneXpert MRSA Assay (FDA approved for NASAL specimens only), is one component of a comprehensive MRSA colonization surveillance program.  It is not intended to diagnose MRSA infection nor to guide or monitor treatment for MRSA infections.       Radiology Studies: No results found.      Scheduled Meds: . amiodarone  200 mg Oral BID  . antiseptic oral rinse  7 mL Mouth Rinse BID  . aspirin  81 mg Oral Daily  . atorvastatin  80 mg Oral q1800  . bisacodyl  10 mg Rectal Daily  . feeding supplement (GLUCERNA SHAKE)  237 mL Oral BID BM  . fentaNYL (SUBLIMAZE) injection  25 mcg Intravenous Once  . furosemide  40 mg Oral BID  . insulin aspart  0-20 Units Subcutaneous TID WC  . insulin glargine  20 Units Subcutaneous BID  . off the beat book   Does not apply Once  . pantoprazole (PROTONIX) IV  40 mg Intravenous Q24H  . polyethylene glycol  17 g Oral Daily  . [START ON 11/26/2015] rivaroxaban  20 mg Oral Q supper  . sodium chloride flush  10-40 mL Intracatheter Q12H  . sodium chloride flush  3 mL Intravenous Q12H   Continuous Infusions: . sodium chloride 10 mL/hr at 11/24/15 2000     LOS: 7 days   Time spent: 25 minutes   Leisa Lenz, MD [facility] Pager [phone]  If 7PM-7AM, please contact night-coverage [email or web address] Password [email or web address] 11/25/2015, 2:12 PM

## 2015-11-25 NOTE — Progress Notes (Addendum)
Report received from [place], [place] for transfer [phone] who also stated she contacted Dr. Charlies Silvers who stated patient does not need CVP monitoring once transferred to unit.

## 2015-11-25 NOTE — Progress Notes (Signed)
Report given to rachel, Rn who will receive patient on 5W03 at 1740. Patient was transferred via bed to 5W with belongings. Patient's CVP was disconnected and saline locked, per conversation with Dr. Charlies Silvers, patient will not need CVP monitoring anymore. CCMD notified of transfer- patient will be on telemetry on 5W, but was transferred off telemetry. Belongings sent with patient, including clothing, purse, and prosthesis.  Milford Cage, RN

## 2015-11-25 NOTE — Plan of Care (Signed)
Problem: Education: Goal: Knowledge of [facility] Education information/materials will improve Outcome: Progressing Discussed her concerns about her constipation (see MAR)

## 2015-11-26 DIAGNOSIS — J9601 Acute respiratory failure with hypoxia: Secondary | ICD-10-CM | POA: Diagnosis not present

## 2015-11-26 DIAGNOSIS — E875 Hyperkalemia: Secondary | ICD-10-CM | POA: Diagnosis not present

## 2015-11-26 DIAGNOSIS — I4891 Unspecified atrial fibrillation: Secondary | ICD-10-CM | POA: Diagnosis not present

## 2015-11-26 DIAGNOSIS — E1121 Type 2 diabetes mellitus with diabetic nephropathy: Secondary | ICD-10-CM | POA: Diagnosis not present

## 2015-11-26 DIAGNOSIS — I10 Essential (primary) hypertension: Secondary | ICD-10-CM | POA: Diagnosis not present

## 2015-11-26 DIAGNOSIS — N183 Chronic kidney disease, stage 3 (moderate): Secondary | ICD-10-CM | POA: Diagnosis not present

## 2015-11-26 LAB — CBC
HCT: 31.3 % — ABNORMAL LOW (ref 36.0–46.0)
Hemoglobin: 9.6 g/dL — ABNORMAL LOW (ref 12.0–15.0)
MCH: 25.8 pg — AB (ref 26.0–34.0)
MCHC: 30.7 g/dL (ref 30.0–36.0)
MCV: 84.1 fL (ref 78.0–100.0)
PLATELETS: 330 10*3/uL (ref 150–400)
RBC: 3.72 MIL/uL — AB (ref 3.87–5.11)
RDW: 14.5 % (ref 11.5–15.5)
WBC: 7.1 10*3/uL (ref 4.0–10.5)

## 2015-11-26 LAB — GLUCOSE, CAPILLARY
GLUCOSE-CAPILLARY: 298 mg/dL — AB (ref 65–99)
GLUCOSE-CAPILLARY: 315 mg/dL — AB (ref 65–99)
Glucose-Capillary: 299 mg/dL — ABNORMAL HIGH (ref 65–99)
Glucose-Capillary: 237 mg/dL — ABNORMAL HIGH (ref 65–99)

## 2015-11-26 MED ORDER — GLUCERNA SHAKE PO LIQD: 237.0000 mL | Freq: Two times a day (BID) | ORAL | Status: DC

## 2015-11-26 MED ORDER — RIVAROXABAN 20 MG PO TABS: 20.0000 mg | ORAL_TABLET | Freq: Every day | ORAL | Status: DC

## 2015-11-26 MED ORDER — FUROSEMIDE 40 MG PO TABS: 40.0000 mg | ORAL_TABLET | Freq: Two times a day (BID) | ORAL | Status: DC

## 2015-11-26 MED ORDER — AMIODARONE HCL 200 MG PO TABS: 200.0000 mg | ORAL_TABLET | Freq: Two times a day (BID) | ORAL | Status: DC

## 2015-11-26 MED ORDER — DM-GUAIFENESIN ER 30-600 MG PO TB12
1.0000 | ORAL_TABLET | Freq: Two times a day (BID) | ORAL | Status: DC
  Administered 2015-11-26 – 2015-11-27 (×2): 1 via ORAL
  Filled 2015-11-26 (×3): qty 1

## 2015-11-26 MED ORDER — OXYCODONE-ACETAMINOPHEN 5-325 MG PO TABS: 1.0000 | ORAL_TABLET | Freq: Four times a day (QID) | ORAL | Status: DC | PRN

## 2015-11-26 MED ORDER — INSULIN GLARGINE 100 UNIT/ML ~~LOC~~ SOLN: 30.0000 [IU] | Freq: Two times a day (BID) | SUBCUTANEOUS | Status: DC

## 2015-11-26 NOTE — Progress Notes (Addendum)
Patient ID: [name], female   DOB: 09/24/1940, 75 y.o.   MRN: [id number]  PROGRESS NOTE    [name]  [id number] DOB: August 16, 1940 DOA: 11/18/2015  PCP: Gerrit Heck, MD  Outpatient Specialists: Dr. Curt Jews, vascular surgery   Brief Narrative:  75 year old female with PMH of CAD and bladder Ca s/p resection, admitted 4/8 for UTI/AF RVR. Pt subsequently developed progressive dyspnea attributed to CHF in setting AF. Pt then converted to junctional brady. On 4/1, pt suffered respiratory arrest requiring intubation.  Care transitioned to [facility] starting 11/24/2015.  Assessment & Plan:  Acute hypoxemic / hypercarbic respiratory failure secondary to pulmonary edema  - Stable respiratory status  - Continue lasix 40 mg BID  A-fib with RVR  - CHADS vasc score 4 - Continue  xarelto - Continue amiodarone  Acute diastolic CHF / CAD s/p stent to LCX - Continue lasix 40 gm BID - Echo on this admission showed EF 65-70%. 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 - Continue Lantus 20 units BID and SSI  - CBG's in past 24 hours: 249, 298, 299  Dyslipidemia associated with type 2 DM - Continue statin therapy   Acute metabolic encephalopathy - In the setting on acute resp failure - At baseline, good, oriented to time, place and person   DVT prophylaxis: on [facility] with xarelto  Code Status: full code  Family Communication: no family at the bedside this am  Disposition Plan: to SNF 4/17   Consultants:   Cardiology  Procedures:   Intubations 11/20/2015 --> 11/22/2015  Left IJ line 11/20/2015   Antimicrobials:   Rocephin 4/8 >4/10  Cefepime  4/10>>>4/13  Vancomycin 4/8 >4/13   Subjective: No overnight events. No respiratory distress.   Objective: Filed Vitals:   11/25/15 2037 11/25/15 2114 11/25/15 2141 11/26/15 0454  BP: 160/76 145/66 145/66 139/60  Pulse: 94 92 92 90  Temp: 98 F (36.7 C) 99.1 F (37.3 C) 99.1 F (37.3 C) 98.9 F (37.2 C)  TempSrc: Oral Oral Oral Oral  Resp: 16 16 18 17   Height:      Weight:      SpO2: 99% 98% 98% 94%    Intake/Output Summary (Last 24 hours) at 11/26/15 1321 Last data filed at 11/26/15 0501  Gross per 24 hour  Intake    675 ml  Output   1700 ml  Net  -1025 ml   Filed Weights   11/23/15 0500 11/24/15 0428 11/25/15 0404  Weight: 94.8 kg (208 lb 15.9 oz) 90 kg (198 lb 6.6 oz) 87.136 kg (192 lb 1.6 oz)    Examination:  General exam: Appears calm and comfortable  Respiratory system: good respiratory effort, no wheezing  Cardiovascular system: S1 & S2 (+) , RRR. No swelling  Gastrointestinal system: (+) BS, non tender, non distended  Central nervous system: No focal deficits, alert Extremities: Strength 5/5; no cyanosis  Skin: warm and dry Psychiatry: Normal mood, normal behavior   Data Reviewed: I have personally reviewed following labs and imaging studies  CBC:  Recent Labs Lab 11/20/15 1045  11/22/15 0320 11/23/15 0440 11/24/15 0520 11/25/15 [id number]  11/26/15 0623  WBC 10.5  < > 9.0 9.8 7.5 8.1 7.1  NEUTROABS 9.4*  --   --   --   --   --   --   HGB 10.0*  < > 9.1* 9.4* 9.5* 10.2* 9.6*  HCT 32.7*  < > 28.3* 29.8* 30.9* 32.4* 31.3*  MCV 84.5  < > 80.4 81.9 83.1 82.9 84.1  PLT 276  < > 253 290 304 347 330  < > = values in this interval not displayed. Basic Metabolic Panel:  Recent Labs Lab 11/20/15 1045  11/21/15 1244 11/22/15 0320 11/23/15 0015 11/23/15 0440 11/24/15 0520 11/25/15 0432  NA 132*  < > 136 136 137 140 141 140  K 6.0*  < > 3.4* 4.1 3.4* 4.1 4.5 4.4  CL 109  < > 103 104 97* 101 100* 97*  CO2 15*  < > 21* 23 27 28 31  32  GLUCOSE 374*   < > 184* 270* 268* 148* 142* 167*  BUN 49*  < > 40* 35* 26* 27* 23* 25*  CREATININE 1.75*  < > 1.35* 1.22* 0.97 0.98 1.12* 1.25*  CALCIUM 8.7*  < > 9.0 8.9 8.9 9.2 9.6 9.9  MG 1.7  < > 1.3* 1.6* 1.8 2.0 1.5* 1.8  PHOS 5.4*  --  2.0* 2.0*  --  4.4 2.8  --   < > = values in this interval not displayed. GFR: Estimated Creatinine Clearance: 40.9 mL/min (by C-G formula based on Cr of 1.25). Liver Function Tests:  Recent Labs Lab 11/20/15 1045  AST 54*  ALT 57*  ALKPHOS 168*  BILITOT 0.5  PROT 5.7*  ALBUMIN 1.9*   No results for input(s): LIPASE, AMYLASE in the last 168 hours. No results for input(s): AMMONIA in the last 168 hours. Coagulation Profile: No results for input(s): INR, PROTIME in the last 168 hours. Cardiac Enzymes:  Recent Labs Lab 11/20/15 1045  TROPONINI <0.03   BNP (last 3 results) No results for input(s): PROBNP in the last 8760 hours. HbA1C: No results for input(s): HGBA1C in the last 72 hours. CBG:  Recent Labs Lab 11/25/15 1235 11/25/15 1727 11/25/15 2114 11/26/15 0735 11/26/15 1149  GLUCAP 301* 228* 249* 298* 299*   Lipid Profile: No results for input(s): CHOL, HDL, LDLCALC, TRIG, CHOLHDL, LDLDIRECT in the last 72 hours. Thyroid Function Tests: No results for input(s): TSH, T4TOTAL, FREET4, T3FREE, THYROIDAB in the last 72 hours. Anemia Panel: No results for input(s): VITAMINB12, FOLATE, FERRITIN, TIBC, IRON, RETICCTPCT in the last 72 hours. Urine analysis:    Component Value Date/Time   COLORURINE YELLOW 11/18/2015 0343   APPEARANCEUR HAZY* 11/18/2015 0343   LABSPEC 1.015 11/18/2015 0343   PHURINE 6.5 11/18/2015 0343   GLUCOSEU NEGATIVE 11/18/2015 0343   HGBUR LARGE* 11/18/2015 0343   BILIRUBINUR SMALL* 11/18/2015 0343   KETONESUR NEGATIVE 11/18/2015 0343   PROTEINUR 100* 11/18/2015 0343   UROBILINOGEN 0.2 09/12/2014 0949   NITRITE NEGATIVE 11/18/2015 0343   LEUKOCYTESUR MODERATE* 11/18/2015 0343   Sepsis  Labs: (@LABRCNTIP (procalcitonin:4,lacticidven:4)  Recent Results (from the past 240 hour(s))  Culture, blood (Routine X 2) w Reflex to ID Panel     Status: None   Collection Time: 11/18/15  3:23 AM  Result Value Ref Range Status   Specimen Description BLOOD RIGHT WRIST  Final   Special Requests   Final    BOTTLES DRAWN AEROBIC AND ANAEROBIC 5CC AER [place] ANA   Culture NO GROWTH 5 DAYS  Final   Report  Status 11/23/2015 FINAL  Final  Culture, blood (Routine X 2) w Reflex to ID Panel     Status: Abnormal   Collection Time: 11/18/15  3:25 AM  Result Value Ref Range Status   Specimen Description BLOOD LEFT WRIST  Final   Special Requests IN PEDIATRIC BOTTLE 6CC  Final   Culture  Setup Time   Final    GRAM POSITIVE COCCI IN CLUSTERS IN PEDIATRIC BOTTLE CRITICAL RESULT CALLED TO, READ BACK BY AND VERIFIED WITH: Nicholes Calamity, [facility]    Culture (A)  Final    STAPHYLOCOCCUS SPECIES (COAGULASE NEGATIVE) THE SIGNIFICANCE OF ISOLATING THIS ORGANISM FROM A SINGLE SET OF BLOOD CULTURES WHEN MULTIPLE SETS ARE DRAWN IS UNCERTAIN. PLEASE NOTIFY THE MICROBIOLOGY DEPARTMENT WITHIN ONE WEEK IF SPECIATION AND SENSITIVITIES ARE REQUIRED.    Report Status 11/21/2015 FINAL  Final  Urine culture     Status: None   Collection Time: 11/18/15  4:58 AM  Result Value Ref Range Status   Specimen Description URINE, RANDOM  Final   Special Requests BAG(PED)  Final   Culture MULTIPLE SPECIES PRESENT, SUGGEST RECOLLECTION  Final   Report Status 11/19/2015 FINAL  Final  MRSA PCR Screening     Status: None   Collection Time: 11/18/15  2:42 PM  Result Value Ref Range Status   MRSA by PCR NEGATIVE NEGATIVE Final    Comment:        The GeneXpert MRSA Assay (FDA approved for NASAL specimens only), is one component of a comprehensive MRSA colonization surveillance program. It is not intended to diagnose MRSA infection nor to guide or monitor treatment for MRSA infections.       Radiology  Studies: No results found.      Scheduled Meds: . amiodarone  200 mg Oral BID  . antiseptic oral rinse  7 mL Mouth Rinse BID  . aspirin  81 mg Oral Daily  . atorvastatin  80 mg Oral q1800  . bisacodyl  10 mg Rectal Daily  . feeding supplement (GLUCERNA SHAKE)  237 mL Oral BID BM  . fentaNYL (SUBLIMAZE) injection  25 mcg Intravenous Once  . furosemide  40 mg Oral BID  . insulin aspart  0-20 Units Subcutaneous TID WC  . insulin glargine  20 Units Subcutaneous BID  . off the beat book   Does not apply Once  . pantoprazole (PROTONIX) IV  40 mg Intravenous Q24H  . polyethylene glycol  17 g Oral Daily  . rivaroxaban  20 mg Oral Q supper  . senna  1 tablet Oral Daily  . sodium bicarbonate  1,300 mg Oral BID  . sodium chloride flush  10-40 mL Intracatheter Q12H  . sodium chloride flush  3 mL Intravenous Q12H   Continuous Infusions: . sodium chloride 10 mL/hr at 11/24/15 2000     LOS: 8 days   Time spent: 15 minutes   Leisa Lenz, MD [facility] Pager [phone]  If 7PM-7AM, please contact night-coverage [email or web address] Password [email or web address] 11/26/2015, 1:21 PM

## 2015-11-27 DIAGNOSIS — I480 Paroxysmal atrial fibrillation: Secondary | ICD-10-CM | POA: Diagnosis not present

## 2015-11-27 DIAGNOSIS — I4891 Unspecified atrial fibrillation: Secondary | ICD-10-CM | POA: Diagnosis not present

## 2015-11-27 DIAGNOSIS — E1165 Type 2 diabetes mellitus with hyperglycemia: Secondary | ICD-10-CM | POA: Diagnosis not present

## 2015-11-27 DIAGNOSIS — R262 Difficulty in walking, not elsewhere classified: Secondary | ICD-10-CM | POA: Diagnosis not present

## 2015-11-27 DIAGNOSIS — I2510 Atherosclerotic heart disease of native coronary artery without angina pectoris: Secondary | ICD-10-CM | POA: Diagnosis not present

## 2015-11-27 DIAGNOSIS — I509 Heart failure, unspecified: Secondary | ICD-10-CM | POA: Diagnosis not present

## 2015-11-27 DIAGNOSIS — N390 Urinary tract infection, site not specified: Secondary | ICD-10-CM | POA: Diagnosis not present

## 2015-11-27 DIAGNOSIS — E1121 Type 2 diabetes mellitus with diabetic nephropathy: Secondary | ICD-10-CM | POA: Diagnosis not present

## 2015-11-27 DIAGNOSIS — D090 Carcinoma in situ of bladder: Secondary | ICD-10-CM | POA: Diagnosis not present

## 2015-11-27 DIAGNOSIS — I10 Essential (primary) hypertension: Secondary | ICD-10-CM | POA: Diagnosis not present

## 2015-11-27 DIAGNOSIS — Z5181 Encounter for therapeutic drug level monitoring: Secondary | ICD-10-CM | POA: Diagnosis not present

## 2015-11-27 DIAGNOSIS — N183 Chronic kidney disease, stage 3 (moderate): Secondary | ICD-10-CM | POA: Diagnosis not present

## 2015-11-27 DIAGNOSIS — J9601 Acute respiratory failure with hypoxia: Secondary | ICD-10-CM | POA: Diagnosis not present

## 2015-11-27 DIAGNOSIS — M6281 Muscle weakness (generalized): Secondary | ICD-10-CM | POA: Diagnosis not present

## 2015-11-27 DIAGNOSIS — J9600 Acute respiratory failure, unspecified whether with hypoxia or hypercapnia: Secondary | ICD-10-CM | POA: Diagnosis not present

## 2015-11-27 DIAGNOSIS — E875 Hyperkalemia: Secondary | ICD-10-CM | POA: Diagnosis not present

## 2015-11-27 LAB — BASIC METABOLIC PANEL
Anion gap: 11 (ref 5–15)
BUN: 33 mg/dL — ABNORMAL HIGH (ref 6–20)
CHLORIDE: 92 mmol/L — AB (ref 101–111)
CO2: 31 mmol/L (ref 22–32)
CREATININE: 1.5 mg/dL — AB (ref 0.44–1.00)
Calcium: 9.3 mg/dL (ref 8.9–10.3)
GFR calc non Af Amer: 33 mL/min — ABNORMAL LOW (ref >60)
GFR, EST AFRICAN AMERICAN: 38 mL/min — AB (ref >60)
Glucose, Bld: 383 mg/dL — ABNORMAL HIGH (ref 65–99)
Potassium: 4.3 mmol/L (ref 3.5–5.1)
SODIUM: 134 mmol/L — AB (ref 135–145)

## 2015-11-27 LAB — CBC
HCT: 32.7 % — ABNORMAL LOW (ref 36.0–46.0)
HEMOGLOBIN: 10.1 g/dL — AB (ref 12.0–15.0)
MCH: 25.9 pg — ABNORMAL LOW (ref 26.0–34.0)
MCHC: 30.9 g/dL (ref 30.0–36.0)
MCV: 83.8 fL (ref 78.0–100.0)
PLATELETS: 318 10*3/uL (ref 150–400)
RBC: 3.9 MIL/uL (ref 3.87–5.11)
RDW: 14.4 % (ref 11.5–15.5)
WBC: 7.8 10*3/uL (ref 4.0–10.5)

## 2015-11-27 LAB — GLUCOSE, CAPILLARY
GLUCOSE-CAPILLARY: 341 mg/dL — AB (ref 65–99)
GLUCOSE-CAPILLARY: 318 mg/dL — AB (ref 65–99)

## 2015-11-27 MED ORDER — RIVAROXABAN 15 MG PO TABS: 15.0000 mg | ORAL_TABLET | Freq: Every day | ORAL | Status: DC

## 2015-11-27 MED ORDER — OXYCODONE-ACETAMINOPHEN 5-325 MG PO TABS: 1.0000 | ORAL_TABLET | Freq: Four times a day (QID) | ORAL | Status: DC | PRN

## 2015-11-27 MED ORDER — FUROSEMIDE 40 MG PO TABS: 40.0000 mg | ORAL_TABLET | Freq: Every day | ORAL | Status: DC

## 2015-11-27 MED ORDER — LEVALBUTEROL HCL 1.25 MG/0.5ML IN NEBU: 1.2500 mg | INHALATION_SOLUTION | Freq: Four times a day (QID) | RESPIRATORY_TRACT | Status: DC | PRN

## 2015-11-27 NOTE — Progress Notes (Signed)
Pt prepared for d/c to SNF. Central line d/c'd. Skin intact except as charted in most recent assessments. Vitals are stable. Report called to receiving facility. Pt to be transported by ambulance service.

## 2015-11-27 NOTE — Discharge Summary (Addendum)
Physician Discharge Summary  [name] SNK:[id number] DOB: 1941-02-11 DOA: 11/18/2015  PCP: Gerrit Heck, MD  Admit date: 11/18/2015 Discharge date: 11/27/2015  Recommendations for Outpatient Follow-up:  1. Continue Lasix once daily, continue to monitor creatinine per SNF protocol. Cr is 1.5 on discharge and please consider stopping lasix if Cr is worsening.  2. Continue xarelto for anticoagulation.  Discharge Diagnoses:  Principal Problem:   Type 2 diabetes mellitus with vascular disease ([place]) Active Problems:   History of bladder cancer   HTN (hypertension)   CAD (coronary artery disease)   CKD (chronic kidney disease) stage 3, GFR 30-59 ml/min   Dehydration   Status post above knee amputation of right lower extremity (HCC)   Anemia   Atrial fibrillation with RVR (HCC)   AKI (acute kidney injury) ([place])   Metabolic encephalopathy   Acute hyperkalemia   Abnormal urinalysis   SIRS (systemic inflammatory response syndrome) (HCC)   Hyperosmolar non-ketotic state in patient with type 2 diabetes mellitus (HCC)   Paroxysmal atrial fibrillation (HCC)   Sinus bradycardia   Acute respiratory failure with hypoxia (HCC)   Cardiogenic shock ([place])   New onset atrial fibrillation ([place])   Controlled type 2 diabetes mellitus with diabetic nephropathy, with long-term current use of insulin ([place])    Discharge Condition: stable   Diet recommendation: as tolerated   History of present illness:  75 year old female with PMH of CAD and bladder Ca s/p resection, admitted 4/8 for UTI/AF RVR. Pt subsequently developed progressive dyspnea attributed to CHF in setting AF. Pt then converted to junctional brady. On 4/1, pt suffered respiratory arrest requiring intubation.  Care transitioned to [facility] starting 11/24/2015.  Hospital Course:   Assessment & Plan:  Acute hypoxemic / hypercarbic respiratory failure secondary to pulmonary edema  - Stable respiratory status  - Continue  lasix 40 mg daily  A-fib with RVR  - CHADS vasc score 4 - Continue xarelto on discharge  - Continue amiodarone  Acute diastolic CHF / CAD s/p stent to LCX - Continue lasix 40 gm daily  - Echo on this admission showed EF 65-70%. - Weight 94 kg --> 90 kg --> 87 kg  Hyperkalemia - Resolved with lasix   SIRS/Sepsis / UTI / Bacteremia vs contamination (coag neg staph in one bottle) - Sepsis criteria met on admission - Completed abx per PCCM - SIRS /sepsis resolved  CKD stage 3 - Baseline Cr a year ago was 1.42 - Cr 1.5 this am, we ordered lasix once a day instead of 3 times a day - Monitor renal function while pt on lasix   Diabetes mellitus with diabetic nephropathy with long term insulin use - Continue Lantus 20 units BID and SSI   Dyslipidemia associated with type 2 DM - Continue statin therapy   Acute metabolic encephalopathy - In the setting on acute resp failure - Stable   DVT prophylaxis: on AC with xarelto  Code Status: full code  Family Communication: no family at the bedside this am     Consultants:   Cardiology  Procedures:   Intubations 11/20/2015 --> 11/22/2015  Left IJ line 11/20/2015  Antimicrobials:   Rocephin 4/8 >4/10  Cefepime 4/10>>>4/13  Vancomycin 4/8 >4/13    Signed:  Leisa Lenz, MD  [facility] 11/27/2015, 8:10 AM  Pager #: [phone]  Time spent in minutes: more than 30 minutes    Discharge Exam: Filed Vitals:   11/26/15 2151 11/27/15 0544  BP: 133/64 153/57  Pulse: 91 98  Temp: 98.4 F (36.9 C) 98.6 F (37 C)  Resp: 16 18   Filed Vitals:   11/26/15 0454 11/26/15 1330 11/26/15 2151 11/27/15 0544  BP: 139/60 120/55 133/64 153/57  Pulse: 90 99 91 98  Temp: 98.9 F (37.2 C)  98.4 F (36.9 C) 98.6 F (37 C)  TempSrc: Oral  Oral Oral  Resp: _0 Height:      Weight:  87.135 kg (192 lb 1.6 oz)    SpO2: 94% 94% 96% 95%    General: Pt is alert, follows commands appropriately, not in acute  distress Cardiovascular: Regular rate and rhythm, S1/S2 + Respiratory: Clear to auscultation bilaterally, no wheezing, no crackles, no rhonchi Abdominal: Soft, non tender, non distended, bowel sounds +, no guarding Extremities: no edema, no cyanosis, pulses palpable bilaterally DP and PT Neuro: Grossly nonfocal  Discharge Instructions  Discharge Instructions    Call MD for:  difficulty breathing, headache or visual disturbances    Complete by:  As directed      Call MD for:  persistant dizziness or light-headedness    Complete by:  As directed      Call MD for:  persistant nausea and vomiting    Complete by:  As directed      Call MD for:  severe uncontrolled pain    Complete by:  As directed      Diet - low sodium heart healthy    Complete by:  As directed      Increase activity slowly    Complete by:  As directed             Medication List    STOP taking these medications        cephALEXin 500 MG capsule  Commonly known as:  KEFLEX     HYDROcodone-acetaminophen 5-325 MG tablet  Commonly known as:  NORCO/VICODIN      TAKE these medications        acetaminophen 500 MG tablet  Commonly known as:  TYLENOL  Take 1,000 mg by mouth every 6 (six) hours as needed for mild pain.     amiodarone 200 MG tablet  Commonly known as:  PACERONE  Take 1 tablet (200 mg total) by mouth 2 (two) times daily.     aspirin EC 81 MG tablet  Take 81 mg by mouth every evening.     atorvastatin 80 MG tablet  Commonly known as:  LIPITOR  TAKE 1 TABLET (80 MG TOTAL) BY MOUTH EVERY EVENING.     feeding supplement (GLUCERNA SHAKE) Liqd  Take 237 mLs by mouth 2 (two) times daily between meals.     furosemide 40 MG tablet  Commonly known as:  LASIX  Take 1 tablet (40 mg total) by mouth daily.     insulin glargine 100 UNIT/ML injection  Commonly known as:  LANTUS  Inject 0.3 mLs (30 Units total) into the skin 2 (two) times daily. 55 units in the morning and 60 units at night      insulin lispro 100 UNIT/ML injection  Commonly known as:  HUMALOG  Inject 20-50 Units into the skin daily as needed for high blood sugar. PER SLIDING SCALE     isosorbide mononitrate 30 MG 24 hr tablet  Commonly known as:  IMDUR  TAKE 1 TABLET (30 MG TOTAL) BY MOUTH DAILY.     levalbuterol 1.25 MG/0.5ML nebulizer solution  Commonly known as:  XOPENEX  Take 1.25 mg by nebulization every 6 (six)  hours as needed for wheezing or shortness of breath.     Magnesium 300 MG Caps  Take 300 mg by mouth daily.     Melatonin 10 MG Tabs  Take 10 mg by mouth at bedtime.     metoprolol succinate 100 MG 24 hr tablet  Commonly known as:  TOPROL-XL  TAKE 1 TABLET (100 MG TOTAL) BY MOUTH EVERY EVENING. TAKE WITH OR IMMEDIATELY FOLLOWING A MEAL.     NITROSTAT 0.6 MG SL tablet  Generic drug:  nitroGLYCERIN  Take 0.6 mg by mouth every 5 (five) minutes x 3 doses as needed.     omeprazole 20 MG capsule  Commonly known as:  PRILOSEC  Take 20 mg by mouth every evening.     oxyCODONE-acetaminophen 5-325 MG tablet  Commonly known as:  PERCOCET  Take 1-2 tablets by mouth every 6 (six) hours as needed for severe pain.     polyethylene glycol packet  Commonly known as:  MIRALAX / GLYCOLAX  Take 17 g by mouth daily.     potassium chloride SA 20 MEQ tablet  Commonly known as:  K-DUR,KLOR-CON  Take 20 mEq by mouth daily.     PRESERVISION AREDS PO  Take 1 capsule by mouth 2 (two) times daily.     Rivaroxaban 15 MG Tabs tablet  Commonly known as:  XARELTO  Take 1 tablet (15 mg total) by mouth daily with supper.     senna 8.6 MG Tabs tablet  Commonly known as:  SENOKOT  Take 1 tablet by mouth See admin instructions. Takes 1 tab daily and can take addt'l tab as needed for constipation     sodium bicarbonate 650 MG tablet  Take 1,300 mg by mouth 2 (two) times daily.     VEGETABLE LAXATIVE PO  Take 4 tablets by mouth at bedtime.            Follow-up Information    Follow up with  Gerrit Heck, MD. Schedule an appointment as soon as possible for a visit in 1 week.   Specialty:  Family Medicine   Why:  Follow up appt after recent hospitalization   Contact information:   [place] [id number] [phone]        The results of significant diagnostics from this hospitalization (including imaging, microbiology, ancillary and laboratory) are listed below for reference.    Significant Diagnostic Studies: Dg Chest 1 View  11/20/2015  CLINICAL DATA:  CHF.  New onset shortness of Breath EXAM: CHEST 1 VIEW COMPARISON:  11/19/2015 FINDINGS: Cardiomegaly. Bilateral perihilar and lower lobe opacities with probable layering effusions, similar to prior study, most compatible with CHF. No change since prior study. IMPRESSION: Stable CHF pattern.  Probable layering effusions. Electronically Signed   By: Rolm Baptise M.D.   On: 11/20/2015 09:37   Dg Chest 2 View  11/14/2015  CLINICAL DATA:  Chest pain and shortness of breath for 2 days. Initial encounter. EXAM: CHEST  2 VIEW COMPARISON:  PA and lateral chest 10/05/2015 and 11/14/2012. FINDINGS: The lungs are clear. Heart size is normal. No pneumothorax or pleural effusion. Remote left rib fractures are noted. IMPRESSION: No acute disease. Electronically Signed   By: Inge Rise M.D.   On: 11/14/2015 21:20   Ct Chest Wo Contrast  11/15/2015  CLINICAL DATA:  Chest pain onset today. EXAM: CT CHEST WITHOUT CONTRAST TECHNIQUE: Multidetector CT imaging of the chest was performed following the standard protocol without IV contrast. COMPARISON:  Radiograph 3  hours prior. Included lung bases from CT abdomen/pelvis 10/05/2015 and 06/08/2013. FINDINGS: Mediastinum/Lymph Nodes: Atherosclerotic calcifications of normal caliber thoracic aorta. Coronary artery calcifications versus coronary stent. Heart is normal in size. No pericardial effusion. No mediastinal or evidence of hilar adenopathy on noncontrast exam. There  is a 3.5 cm hypodense nodule in the lower right thyroid gland extending to the isthmus. Lungs/Pleura: No consolidation. No pulmonary edema. Trachea and mainstem bronchi are patent. High-density tree and bud opacities in the right lower lobe, unchanged from prior exams dating back to 2014. Tiny subpleural nodules in the left lower lobe also unchanged dating back to 2014. Upper abdomen: No acute abnormality. Thinning of the included renal parenchyma. Pancreatic atrophy noted. Stomach distended with ingested contents. Musculoskeletal: Multilevel degenerative change throughout spine. Diffuse bony under mineralization. Remote left rib fractures. There are no acute or suspicious osseous abnormalities. IMPRESSION: 1. No acute intrathoracic process. 2. Aortic atherosclerosis, coronary artery calcifications versus stents. 3. Thyroid nodule on the right/isthmus measures 3.5 cm. Recommend nonemergent thyroid ultrasound. 4. Chronic right lung base opacities, unchanged dating back to 2014 and considered benign. This may be secondary to aspirated barium. Electronically Signed   By: Jeb Levering M.D.   On: 11/15/2015 00:36   Mr Cervical Spine Wo Contrast  11/15/2015  CLINICAL DATA:  Severe neck pain, acute on chronic. EXAM: MRI CERVICAL SPINE WITHOUT CONTRAST TECHNIQUE: Multiplanar, multisequence MR imaging of the cervical spine was performed. No intravenous contrast was administered. COMPARISON:  05/24/2010 FINDINGS: The study is motion degraded. Axial sequences are severely motion degraded and largely nondiagnostic with regards to assessment of the spinal canal and neural foramina. Vertebral alignment is unchanged. Vertebral body heights are preserved. No gross vertebral marrow edema is identified. No signal abnormality is identified in the upper cervical cord. Evaluation of the mid to lower cervical cord is limited by motion artifact. Multiple thyroid nodules are partially visualized including up in approximately 4 cm  nodule involving the medial right lobe/isthmus as described on the chest CT earlier today. There is mild disc space narrowing at C5-6. There is moderate narrowing at C6-7 which has progressed from the prior study. Moderate C7-T1 disc space narrowing also may have mildly progressed. Based on the sagittal images, there is suspected moderate spinal stenosis at C2-3, C3-4, C5-6, and C6-7 due to disc bulging, endplate spurring, and infolding of the ligamentum flavum. Multilevel facet arthrosis is present. Multilevel neural foraminal narrowing cannot be adequately assessed. IMPRESSION: Severely motion degraded examination as above. 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 Electronically Signed   By: Logan Bores M.D.   On: 11/15/2015 19:33   Dg Chest Port 1 View  11/22/2015  CLINICAL DATA:  Intubation. EXAM: PORTABLE CHEST 1 VIEW COMPARISON:  11/21/2015. FINDINGS: Endotracheal tube, NG tube, left IJ line in stable position. Cardiomegaly with pulmonary vascular prominence and bilateral basilar infiltrates/edema and bilateral pleural effusions again noted. Findings consistent congestive heart failure. No pneumothorax. IMPRESSION: 1. Lines and tubes in stable position. 2. Persistent changes of congestive heart failure bibasilar pulmonary edema and bilateral pleural effusions. Similar findings noted on prior exam. Bilateral pneumonia cannot be excluded . Electronically Signed   By: Marcello Moores  Register   On: 11/22/2015 07:34   Dg Chest Port 1 View  11/21/2015  CLINICAL DATA:  Intubation. EXAM: PORTABLE CHEST 1 VIEW COMPARISON:  11/20/2015.  11/19/2015. FINDINGS: Endotracheal tube, left IJ line in stable position. OG tube noted with tip below left hemidiaphragm. Persistent cardiomegaly. Persistent bilateral pulmonary  alveolar infiltrates and pleural effusions suggesting congestive heart failure. Bilateral lower lobe pneumonia cannot be excluded. No pneumothorax.  IMPRESSION: 1. Endotracheal tube and left IJ line in stable position. NG tube noted with tip below left hemidiaphragm. 2. Persistent cardiomegaly with prominent bilateral lower lobe infiltrates and bilateral pleural effusions again noted. Findings suggest congestive heart failure. Bilateral pneumonia cannot be excluded. Electronically Signed   By: Maisie Fus  Register   On: 11/21/2015 11:50   Dg Chest Port 1 View  11/20/2015  CLINICAL DATA:  Central line placement EXAM: PORTABLE CHEST 1 VIEW COMPARISON:  11/20/2015 FINDINGS: Cardiomegaly again noted. Atherosclerotic calcifications of thoracic aorta. There is endotracheal tube in place with tip 1.5 cm above the carina. There is left IJ central line. The tip of the central line is obscured by overlying chest wall pad. Persistent congestion/pulmonary edema and bilateral pleural effusion. No pneumothorax. IMPRESSION: Endotracheal tube in place with tip 1.5 cm above the carina. There is left IJ central line. The tip of the central line is obscured by overlying chest wall pad. Persistent congestion/pulmonary edema and bilateral pleural effusion. No pneumothorax. A repeat x-ray is recommended. Electronically Signed   By: Natasha Mead M.D.   On: 11/20/2015 10:44   Dg Chest Port 1 View  11/19/2015  CLINICAL DATA:  Dyspnea EXAM: PORTABLE CHEST 1 VIEW COMPARISON:  None FINDINGS: There is moderate cardiomegaly. There is vascular and interstitial prominence. There are pleural effusions. There is perihilar ground-glass opacity. IMPRESSION: Congestive heart failure. Electronically Signed   By: Ellery Plunk M.D.   On: 11/19/2015 23:39   Dg Chest Port 1 View  11/18/2015  CLINICAL DATA:  Hypotension.  Tachycardia. EXAM: PORTABLE CHEST 1 VIEW COMPARISON:  11/14/2015 FINDINGS: There is unchanged borderline cardiomegaly. The lungs are clear. Is no large effusion. Pulmonary vasculature is normal. No interval change is evident. IMPRESSION: No active disease. Electronically Signed    By: Ellery Plunk M.D.   On: 11/18/2015 04:35    Microbiology: Recent Results (from the past 240 hour(s))  Culture, blood (Routine X 2) w Reflex to ID Panel     Status: None   Collection Time: 11/18/15  3:23 AM  Result Value Ref Range Status   Specimen Description BLOOD RIGHT WRIST  Final   Special Requests   Final    BOTTLES DRAWN AEROBIC AND ANAEROBIC 5CC AER 7CC ANA   Culture NO GROWTH 5 DAYS  Final   Report Status 11/23/2015 FINAL  Final  Culture, blood (Routine X 2) w Reflex to ID Panel     Status: Abnormal   Collection Time: 11/18/15  3:25 AM  Result Value Ref Range Status   Specimen Description BLOOD LEFT WRIST  Final   Special Requests IN PEDIATRIC BOTTLE 6CC  Final   Culture  Setup Time   Final    GRAM POSITIVE COCCI IN CLUSTERS IN PEDIATRIC BOTTLE CRITICAL RESULT CALLED TO, READ BACK BY AND VERIFIED WITH: Burnard Hawthorne, [id number] [phone] [facility]    Culture (A)  Final    STAPHYLOCOCCUS SPECIES (COAGULASE NEGATIVE) THE SIGNIFICANCE OF ISOLATING THIS ORGANISM FROM A SINGLE SET OF BLOOD CULTURES WHEN MULTIPLE SETS ARE DRAWN IS UNCERTAIN. PLEASE NOTIFY THE MICROBIOLOGY DEPARTMENT WITHIN ONE WEEK IF SPECIATION AND SENSITIVITIES ARE REQUIRED.    Report Status 11/21/2015 FINAL  Final  Urine culture     Status: None   Collection Time: 11/18/15  4:58 AM  Result Value Ref Range Status   Specimen Description URINE, RANDOM  Final   Special Requests BAG(PED)  Final   Culture MULTIPLE SPECIES PRESENT, SUGGEST RECOLLECTION  Final   Report Status 11/19/2015 FINAL  Final  MRSA PCR Screening     Status: None   Collection Time: 11/18/15  2:42 PM  Result Value Ref Range Status   MRSA by PCR NEGATIVE NEGATIVE Final    Comment:        The GeneXpert MRSA Assay (FDA approved for NASAL specimens only), is one component of a comprehensive MRSA colonization surveillance program. It is not intended to diagnose MRSA infection nor to guide or monitor treatment for MRSA infections.       Labs: Basic Metabolic Panel:  Recent Labs Lab 11/20/15 1045  11/21/15 1244 11/22/15 0320 11/23/15 0015 11/23/15 0440 11/24/15 0520 11/25/15 0432 11/27/15 0530  NA 132*  < > 136 136 137 140 141 140 134*  K 6.0*  < > 3.4* 4.1 3.4* 4.1 4.5 4.4 4.3  CL 109  < > 103 104 97* 101 100* 97* 92*  CO2 15*  < > 21* _0 32 31  GLUCOSE 374*  < > 184* 270* 268* 148* 142* 167* 383*  BUN 49*  < > 40* 35* 26* 27* 23* 25* 33*  CREATININE 1.75*  < > 1.35* 1.22* 0.97 0.98 1.12* 1.25* 1.50*  CALCIUM 8.7*  < > 9.0 8.9 8.9 9.2 9.6 9.9 9.3  MG 1.7  < > 1.3* 1.6* 1.8 2.0 1.5* 1.8  --   PHOS 5.4*  --  2.0* 2.0*  --  4.4 2.8  --   --   < > = values in this interval not displayed. Liver Function Tests:  Recent Labs Lab 11/20/15 1045  AST 54*  ALT 57*  ALKPHOS 168*  BILITOT 0.5  PROT 5.7*  ALBUMIN 1.9*   No results for input(s): LIPASE, AMYLASE in the last 168 hours. No results for input(s): AMMONIA in the last 168 hours. CBC:  Recent Labs Lab 11/20/15 1045  11/23/15 0440 11/24/15 0520 11/25/15 0432 11/26/15 0623 11/27/15 0530  WBC 10.5  < > 9.8 7.5 8.1 7.1 7.8  NEUTROABS 9.4*  --   --   --   --   --   --   HGB 10.0*  < > 9.4* 9.5* 10.2* 9.6* 10.1*  HCT 32.7*  < > 29.8* 30.9* 32.4* 31.3* 32.7*  MCV 84.5  < > 81.9 83.1 82.9 84.1 83.8  PLT 276  < > 290 304 347 330 318  < > = values in this interval not displayed. Cardiac Enzymes:  Recent Labs Lab 11/20/15 1045  TROPONINI <0.03   BNP: BNP (last 3 results)  Recent Labs  11/20/15 0914 11/20/15 1046  BNP 463.1* 344.5*    ProBNP (last 3 results) No results for input(s): PROBNP in the last 8760 hours.  CBG:  Recent Labs Lab 11/26/15 0735 11/26/15 1149 11/26/15 1649 11/26/15 2148 11/27/15 0741  GLUCAP 298* 299* 237* 315* 341*

## 2015-11-27 NOTE — Clinical Social Work Placement (Signed)
   CLINICAL SOCIAL WORK PLACEMENT  NOTE  Date:  11/27/2015  Patient Details  Name: [name] MRN: [id number] Date of Birth: 06-25-1941  Clinical Social Work is seeking post-discharge placement for this patient at the [place] level of care (*CSW will initial, date and re-position this form in  chart as items are completed):  Yes   Patient/family provided with [place] Work Department's list of facilities offering this level of care within the geographic area requested by the patient (or if unable, by the patient's family).  Yes   Patient/family informed of their freedom to choose among providers that offer the needed level of care, that participate in Medicare, Medicaid or managed care program needed by the patient, have an available bed and are willing to accept the patient.  Yes   Patient/family informed of [facility]'s ownership interest in [facility] and [facility], as well as of the fact that they are under no obligation to receive care at these facilities.  PASRR submitted to EDS on       PASRR number received on       Existing PASRR number confirmed on 11/24/15     FL2 transmitted to all facilities in geographic area requested by pt/family on 11/24/15     FL2 transmitted to all facilities within larger geographic area on       Patient informed that his/her managed care company has contracts with or will negotiate with certain facilities, including the following:        Yes   Patient/family informed of bed offers received.  Patient chooses bed at [facility]     Physician recommends and patient chooses bed at      Patient to be transferred to [facility] on 11/27/15.  Patient to be transferred to facility by ptar     Patient family notified on 11/27/15 of transfer.  Name of family member notified:  Horris Latino     PHYSICIAN Please sign FL2     Additional Comment:     _______________________________________________ Cranford Mon, LCSW 11/27/2015, 1:04 PM

## 2015-11-27 NOTE — Care Management Important Message (Signed)
Important Message  Patient Details  Name: [name] MRN: [id number] Date of Birth: June 25, 1941   Medicare Important Message Given:  Yes    Deontre Allsup P [name] 11/27/2015, 2:01 PM

## 2015-11-27 NOTE — Consult Note (Signed)
   [facility] CM Inpatient Consult   11/27/2015  AILANY ESKELSON [birth year] [id number] Patient screened for potential [facility] Management services. Patient is eligible for [place] with [employer]. Epic reveals patient's discharge plan is for a skilled nursing facility,  there were no identifiable needs for [facility] care management at this time. [facility] Care Management services not appropriate at this time. If patient's post hospital needs change please place a [facility] Care Management consult. For questions please contact:   Natividad Brood, RN BSN [facility] Liaison  [phone] business mobile phone Toll free office [phone]

## 2015-11-27 NOTE — Progress Notes (Signed)
Patient will discharge to [facility] Anticipated discharge date: 4/17 Family notified: pt dtr Transportation by [facility]- scheduled for 1:30pm  CSW signing off.  Domenica Reamer, [place] Social Worker [phone]

## 2015-11-27 NOTE — Progress Notes (Addendum)
Inpatient Diabetes Program Recommendations  AACE/ADA: New Consensus Statement on Inpatient Glycemic Control (2015)  Target Ranges:  Prepandial:   less than 140 mg/dL      Peak postprandial:   less than 180 mg/dL (1-2 hours)      Critically ill patients:  140 - 180 mg/dL   Spoke with patient about diabetes and home regimen for diabetes control. Patient reports that she is followed by her PCP for diabetes management. She does mention that she has an appointment with and Endocrinologist (Dr. Chalmers Cater) in the next couple of weeks for glucose control. Patient reports that she has not controlled her glucose very well due to being in the Donut hole and rationing out her Lantus. Her PCP does not like her to use the [employer] insulin. Patient does use the NPH from [facility] and only took it once a day. She did not realize it did not last as long. Recommend to patient to use NPH BID when in donut hole. Patient to fill out assistance paperwork to get Lantus for free either from her PCP or at her Endocrinologist appointment. Spoke with patient about her A1c level (14.8%). Patient is familiar with A1c and glucose goals. Stressed to the patient the importance of improving glycemic control to prevent further complications from uncontrolled diabetes. Patient verbalized understanding of information discussed and has no further questions at this time related to diabetes.  Thanks, Tama Headings RN, MSN, [facility] Inpatient Diabetes Coordinator Team Pager [phone] (8a-5p)

## 2015-11-27 NOTE — Care Management Note (Signed)
Case Management Note  Patient Details  Name: [name] MRN: [id number] Date of Birth: 06-16-1941  Subjective/Objective:                 Patient admitted from home, transferred from [facility].   Action/Plan:  Will DC to SNF [facility] today as facilitated by CSW.  Expected Discharge Date:                  Expected Discharge Plan:  Skilled Nursing Facility  In-House Referral:  Clinical Social Work  Discharge planning Services  CM Consult  Post Acute Care Choice:    Choice offered to:     DME Arranged:    DME Agency:     HH Arranged:    [place] Agency:     Status of Service:  Completed, signed off  Medicare Important Message Given:  Yes Date Medicare IM Given:    Medicare IM give by:    Date Additional Medicare IM Given:    Additional Medicare Important Message give by:     If discussed at [place] of Stay Meetings, dates discussed:    Additional Comments:  Carles Collet, RN 11/27/2015, 10:12 AM

## 2015-11-28 DIAGNOSIS — C679 Malignant neoplasm of bladder, unspecified: Secondary | ICD-10-CM | POA: Diagnosis not present

## 2015-11-28 DIAGNOSIS — I2510 Atherosclerotic heart disease of native coronary artery without angina pectoris: Secondary | ICD-10-CM | POA: Diagnosis not present

## 2015-11-28 DIAGNOSIS — E119 Type 2 diabetes mellitus without complications: Secondary | ICD-10-CM | POA: Diagnosis not present

## 2015-11-28 DIAGNOSIS — I509 Heart failure, unspecified: Secondary | ICD-10-CM | POA: Diagnosis not present

## 2015-11-30 DIAGNOSIS — I4891 Unspecified atrial fibrillation: Secondary | ICD-10-CM | POA: Diagnosis not present

## 2015-11-30 DIAGNOSIS — E119 Type 2 diabetes mellitus without complications: Secondary | ICD-10-CM | POA: Diagnosis not present

## 2015-11-30 DIAGNOSIS — I509 Heart failure, unspecified: Secondary | ICD-10-CM | POA: Diagnosis not present

## 2015-11-30 DIAGNOSIS — C679 Malignant neoplasm of bladder, unspecified: Secondary | ICD-10-CM | POA: Diagnosis not present

## 2015-12-01 DIAGNOSIS — I4891 Unspecified atrial fibrillation: Secondary | ICD-10-CM | POA: Diagnosis not present

## 2015-12-01 DIAGNOSIS — M1990 Unspecified osteoarthritis, unspecified site: Secondary | ICD-10-CM | POA: Diagnosis not present

## 2015-12-01 DIAGNOSIS — N390 Urinary tract infection, site not specified: Secondary | ICD-10-CM | POA: Diagnosis not present

## 2015-12-01 DIAGNOSIS — N183 Chronic kidney disease, stage 3 (moderate): Secondary | ICD-10-CM | POA: Diagnosis not present

## 2015-12-01 DIAGNOSIS — I10 Essential (primary) hypertension: Secondary | ICD-10-CM | POA: Diagnosis not present

## 2015-12-01 DIAGNOSIS — I739 Peripheral vascular disease, unspecified: Secondary | ICD-10-CM | POA: Diagnosis not present

## 2015-12-01 DIAGNOSIS — I5030 Unspecified diastolic (congestive) heart failure: Secondary | ICD-10-CM | POA: Diagnosis not present

## 2015-12-01 DIAGNOSIS — R570 Cardiogenic shock: Secondary | ICD-10-CM | POA: Diagnosis not present

## 2015-12-01 DIAGNOSIS — G629 Polyneuropathy, unspecified: Secondary | ICD-10-CM | POA: Diagnosis not present

## 2015-12-01 DIAGNOSIS — J9690 Respiratory failure, unspecified, unspecified whether with hypoxia or hypercapnia: Secondary | ICD-10-CM | POA: Diagnosis not present

## 2015-12-11 DIAGNOSIS — I509 Heart failure, unspecified: Secondary | ICD-10-CM | POA: Diagnosis not present

## 2015-12-11 DIAGNOSIS — I480 Paroxysmal atrial fibrillation: Secondary | ICD-10-CM | POA: Diagnosis not present

## 2015-12-11 DIAGNOSIS — Z5181 Encounter for therapeutic drug level monitoring: Secondary | ICD-10-CM | POA: Diagnosis not present

## 2015-12-11 DIAGNOSIS — D090 Carcinoma in situ of bladder: Secondary | ICD-10-CM | POA: Diagnosis not present

## 2015-12-11 DIAGNOSIS — R262 Difficulty in walking, not elsewhere classified: Secondary | ICD-10-CM | POA: Diagnosis not present

## 2015-12-11 DIAGNOSIS — I2510 Atherosclerotic heart disease of native coronary artery without angina pectoris: Secondary | ICD-10-CM | POA: Diagnosis not present

## 2015-12-11 DIAGNOSIS — N390 Urinary tract infection, site not specified: Secondary | ICD-10-CM | POA: Diagnosis not present

## 2015-12-11 DIAGNOSIS — E1121 Type 2 diabetes mellitus with diabetic nephropathy: Secondary | ICD-10-CM | POA: Diagnosis not present

## 2015-12-11 DIAGNOSIS — M6281 Muscle weakness (generalized): Secondary | ICD-10-CM | POA: Diagnosis not present

## 2015-12-11 DIAGNOSIS — N183 Chronic kidney disease, stage 3 (moderate): Secondary | ICD-10-CM | POA: Diagnosis not present

## 2015-12-11 DIAGNOSIS — E1165 Type 2 diabetes mellitus with hyperglycemia: Secondary | ICD-10-CM | POA: Diagnosis not present

## 2015-12-19 ENCOUNTER — Ambulatory Visit (INDEPENDENT_AMBULATORY_CARE_PROVIDER_SITE_OTHER): Payer: PPO | Admitting: Physician Assistant

## 2015-12-19 ENCOUNTER — Encounter: Payer: Self-pay | Admitting: Physician Assistant

## 2015-12-19 VITALS — BP 116/72 | HR 86 | Ht 63.0 in | Wt 192.4 lb

## 2015-12-19 DIAGNOSIS — R002 Palpitations: Secondary | ICD-10-CM

## 2015-12-19 DIAGNOSIS — R072 Precordial pain: Secondary | ICD-10-CM

## 2015-12-19 DIAGNOSIS — Z7901 Long term (current) use of anticoagulants: Secondary | ICD-10-CM | POA: Diagnosis not present

## 2015-12-19 DIAGNOSIS — I480 Paroxysmal atrial fibrillation: Secondary | ICD-10-CM | POA: Diagnosis not present

## 2015-12-19 MED ORDER — AMIODARONE HCL 200 MG PO TABS: 200.0000 mg | ORAL_TABLET | Freq: Every day | ORAL | Status: DC

## 2015-12-19 NOTE — Progress Notes (Signed)
Cardiology Office Note   Date:  12/19/2015   ID:  [name], DOB July 26, 1941, MRN [id number]  PCP:  Gerrit Heck, MD  Cardiologist:  Dr Marlou Porch, Dr Leona Singleton, PA-C   No chief complaint on file.   History of Present Illness: [name] is a 75 y.o. female with a history of CAD/MI in January of 2014 following bladder surgery at [facility] (secondary to [place]) with chronic urostomy tube, uncontrolled diabetes, hypertension, hyperlipidemia, peripheral vascular disease with right knee BKA, and left Fem-pop Feb 2016, CKD stage III.  D/c 04/17 after admit for sepsis, had afib RVR, tachybrady w/ 6 sec pauses in the setting of hypoxia, ETT. EF nl, amio added, pt diuresed, CHADS2VASC=4, on Xarelto. Amio 200 mg BID for one week then 200 mg per day  [name] presents for post hospital follow-up  [name] initially went to a facility for rehabilitation and then home. She generally uses a wheelchair at home and does not walk very much. While in the wheelchair, she can do all the housework and feels that she does very well. She is working on getting stronger.   She walks very little and easily gets short of breath when she ambulates. She has had a right BKA, and has chronic lower extremity edema on the left. Her volume status is managed by Dr. Detterding. She is supposed to take 2-40 milligram tablets 3 times a day. She takes a total of 80 mg twice a day regularly, rarely gets 80 mg in as a middle dose, sometimes takes 40.  She has had rare palpitations since discharge. She has had a few brief flutters that did not last very long and did not cause any presyncope, shortness of breath or chest pain. She has not had presyncope or syncope. She does not think she has been in anything but sinus rhythm. She is still taking the amiodarone twice a day.  Occasionally, she has pain that starts in her right incisor, goes down into her throat as a tightness and then goes down  into her chest. This is associated with nausea and can last hours. She feels fatigued afterwards and can feel washed out for an entire day. She has never sought treatment while she was having one of these episodes. She has never checked her heart rate or blood pressure during an episode.   Past Medical History  Diagnosis Date  . Hypertension   . Hyperlipidemia   . History of atrial tachycardia   . Peripheral vascular disease (HCC) S/P RIGHT BKA  . Peripheral neuropathy (HCC) LEGS AND HANDS  . 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 GERD (gastroesophageal reflux disease)   . Anemia   . Blood transfusion   . Macular degeneration of both eyes   . Glaucoma   . Retinopathy due to secondary diabetes mellitus ([place])   . History of bladder cancer TCC AND CIS  . Myocardial infarction ([place])   . Presence of urostomy ([place])   . Type 2 diabetes mellitus with vascular disease ([place])   . CKD (chronic kidney disease) stage 3, GFR 30-59 ml/min 01/26/2013  . Obesity (BMI 30-39.9)   . CAD (coronary artery disease) 01/26/2013    Evidently circumflex stent in 2014 following bladder cancer surgery for old myocardial infarction.     Past Surgical History  Procedure Laterality Date  . I & d right below knee amputation wound  06-10-2011  . Cysto / resection bladder bx's  04-01-11  &  11-26-10  . Below knee leg amputation  05-02-2007    RIGHT  . Right foot i & d / removal necrotic bone  JUN  &  AUG 2008  . Right great toe amputation  11-05-2006  . Laparoscopic cholecystectomy  12-10-1999  . Back surgery  [place]  . Appendectomy  1963  . Cataract extraction w/ intraocular lens  implant, bilateral    . Cystoscopy with biopsy  10/14/2011    Procedure: CYSTOSCOPY WITH BIOPSY;  Surgeon: Claybon Jabs, MD;  Location: [facility];  Service: Urology;  Laterality: N/A;  BLADDER BIOPSY  . Coronary stent placement    . Abdominal hysterectomy    . Revision urostomy cutaneous      . Revision urostomy cutaneous    . Abdominal angiogram N/A 09/08/2014    Procedure: ABDOMINAL ANGIOGRAM;  Surgeon: Conrad Wahoo, MD;  Location: [facility] CATH LAB;  Service: Cardiovascular;  Laterality: N/A;  . Femoral-popliteal bypass graft Left 09/12/2014    Procedure:  LEFT FEMORAL-POPLITEAL ARTERY BYPASS GRAFT USING NON REVERSE LEFT GREATER SAPHENOUS VEIN;  Surgeon: Rosetta Posner, MD;  Location: [place];  Service: Vascular;  Laterality: Left;  . Amputation Left 09/14/2014    Procedure: LEFT FOOT FIRST RAY AMPUTATION;  Surgeon: Mcarthur Rossetti, MD;  Location: [place];  Service: Orthopedics;  Laterality: Left;    Current Outpatient Prescriptions  Medication Sig Dispense Refill  . acetaminophen (TYLENOL) 500 MG tablet Take 1,000 mg by mouth every 6 (six) hours as needed for mild pain.     Marland Kitchen amiodarone (PACERONE) 200 MG tablet Take 1 tablet (200 mg total) by mouth 2 (two) times daily. 60 tablet 0  . aspirin EC 81 MG tablet Take 81 mg by mouth every evening.    Marland Kitchen atorvastatin (LIPITOR) 80 MG tablet TAKE 1 TABLET (80 MG TOTAL) BY MOUTH EVERY EVENING. 90 tablet 2  . feeding supplement, GLUCERNA SHAKE, (GLUCERNA SHAKE) LIQD Take 237 mLs by mouth 2 (two) times daily between meals. 237 mL 0  . furosemide (LASIX) 40 MG tablet Take 1 tablet (40 mg total) by mouth daily. 60 tablet 0  . insulin glargine (LANTUS) 100 UNIT/ML injection Inject 0.3 mLs (30 Units total) into the skin 2 (two) times daily. 55 units in the morning and 60 units at night 10 mL 11  . insulin lispro (HUMALOG) 100 UNIT/ML injection Inject 20-50 Units into the skin daily as needed for high blood sugar. PER SLIDING SCALE    . isosorbide mononitrate (IMDUR) 30 MG 24 hr tablet TAKE 1 TABLET (30 MG TOTAL) BY MOUTH DAILY. 30 tablet 7  . levalbuterol (XOPENEX) 1.25 MG/0.5ML nebulizer solution Take 1.25 mg by nebulization every 6 (six) hours as needed for wheezing or shortness of breath. 1 each 0  . Magnesium 300 MG CAPS Take 300 mg by mouth daily.     . Melatonin 10 MG TABS Take 10 mg by mouth at bedtime.     . metoprolol succinate (TOPROL-XL) 100 MG 24 hr tablet TAKE 1 TABLET (100 MG TOTAL) BY MOUTH EVERY EVENING. TAKE WITH OR IMMEDIATELY FOLLOWING A MEAL. 90 tablet 2  . Multiple Vitamins-Minerals (PRESERVISION AREDS PO) Take 1 capsule by mouth 2 (two) times daily.    . nitroGLYCERIN (NITROSTAT) 0.6 MG SL tablet Take 0.6 mg by mouth every 5 (five) minutes x 3 doses as needed.    Marland Kitchen omeprazole (PRILOSEC) 20 MG capsule Take 20 mg by mouth  every evening.     Marland Kitchen oxyCODONE-acetaminophen (PERCOCET) 5-325 MG tablet Take 1-2 tablets by mouth every 6 (six) hours as needed for severe pain. 20 tablet 0  . polyethylene glycol (MIRALAX / GLYCOLAX) packet Take 17 g by mouth daily. (Patient taking differently: Take 17 g by mouth daily as needed for moderate constipation. ) 14 each 0  . potassium chloride SA (K-DUR,KLOR-CON) 20 MEQ tablet Take 20 mEq by mouth daily.     . Psyllium (VEGETABLE LAXATIVE PO) Take 4 tablets by mouth at bedtime.    . Rivaroxaban (XARELTO) 15 MG TABS tablet Take 1 tablet (15 mg total) by mouth daily with supper. 42 tablet 0  . senna (SENOKOT) 8.6 MG TABS tablet Take 1 tablet by mouth See admin instructions. Takes 1 tab daily and can take addt'l tab as needed for constipation    . sodium bicarbonate 650 MG tablet Take 1,300 mg by mouth 2 (two) times daily.     No current facility-administered medications for this visit.    Allergies:   Gabapentin; Lisinopril; Metformin and related; and Biaxin    Social History:  The patient  reports that she quit smoking about 40 years ago. Her smoking use included Cigarettes. She has a 34 pack-year smoking history. She has never used smokeless tobacco. She reports that she drinks alcohol. She reports that she does not use illicit drugs.   Family History:  The patient's family history includes AAA (abdominal aortic aneurysm) in her brother; Diabetes in her brother; Heart attack in her brother and  father; Heart disease in her father; Heart failure in her mother; Hypertension in her father; Sudden death (age of onset: 92) in her father.    ROS:  Please see the history of present illness. All other systems are reviewed and negative.    PHYSICAL EXAM: VS:  BP 116/72 mmHg  Pulse 86  Ht 5\' 3"  (1.6 m)  Wt 192 lb 6.4 oz (87.272 kg)  BMI 34.09 kg/m2  SpO2 97% , BMI Body mass index is 34.09 kg/(m^2). GEN: Well nourished, well developed, female in no acute distress HEENT: normal for age  Neck: no JVD, mildly positive hepatojugular reflux, no carotid bruit, no masses Cardiac: RRR; no murmur, no rubs, or gallops Respiratory:  clear to auscultation bilaterally, normal work of breathing GI: soft, tender to deep palpation, nondistended, + BS MS: no deformity or atrophy; prosthesis on the right, 1-2 plus edema on the left; distal pulses are 2+ in upper extremities, difficult to palpate left lower extremity due to edema Skin: warm and dry, no rash Neuro:  Strength and sensation are intact Psych: euthymic mood, full affect   EKG:  EKG is ordered today. The ekg ordered today demonstrates sinus rhythm, performed with patient sitting up, minor morphology changes from April 2017   Recent Labs: 11/18/2015: TSH 0.735 11/20/2015: ALT 57*; B Natriuretic Peptide 344.5* 11/25/2015: Magnesium 1.8 11/27/2015: BUN 33*; Creatinine, Ser 1.50*; Hemoglobin 10.1*; Platelets 318; Potassium 4.3; Sodium 134*    Lipid Panel No results found for: CHOL, TRIG, HDL, CHOLHDL, VLDL, LDLCALC, LDLDIRECT   Wt Readings from Last 3 Encounters:  12/19/15 192 lb 6.4 oz (87.272 kg)  11/26/15 192 lb 1.6 oz (87.135 kg)  11/14/15 198 lb (89.812 kg)     Other studies Reviewed: Additional studies/ records that were reviewed today include: Hospital records, office notes and testing.  ASSESSMENT AND PLAN:  1.  Atrial fibrillation, tachybradycardia: She is having no significant symptoms of either tachycardia or bradycardia.  Her blood pressure and heart rate are well controlled. We will decrease her amiodarone back to once daily.  2. Chest pain: She gets significantly short of breath with exertion, but never has chest pain with it. She has had a couple of episodes of chest pain that started at rest. She did not have symptoms similar to her atrial fibrillation with these episodes. She is encouraged to check her heart rate or blood pressure while she is having an episode or call 911. For now, no further evaluation.  3. Anticoagulation: She is tolerating the Xarelto well.   Current medicines are reviewed at length with the patient today.  The patient does not have concerns regarding medicines.  The following changes have been made:  Decrease amiodarone to daily  Labs/ tests ordered today include:   Orders Placed This Encounter  Procedures  . EKG 12-Lead     Disposition:   FU with Dr. Marlou Porch and Dr. Curt Bears as needed or as scheduled.  Augusto Garbe  12/19/2015 1:16 PM    [facility] Phone: [phone]; Fax: [phone]  This note was written with the assistance of speech recognition software. Please excuse any transcriptional errors.

## 2015-12-19 NOTE — Patient Instructions (Signed)
Medication Instructions:   START TAKING AMIODARONE 200 MG ONCE A DAY   If you need a refill on your cardiac medications before your next appointment, please call your pharmacy.  Labwork: NONE ORDER TODAY    Testing/Procedures: NONE ORDER TODAY    Follow-Up: WITH DR Marlou Porch IN 2 TO 3 MONTHS    Any Other Special Instructions Will Be Listed Below (If Applicable).

## 2015-12-21 DIAGNOSIS — N3000 Acute cystitis without hematuria: Secondary | ICD-10-CM | POA: Diagnosis not present

## 2015-12-25 ENCOUNTER — Telehealth: Payer: Self-pay | Admitting: Cardiology

## 2015-12-25 ENCOUNTER — Ambulatory Visit: Payer: PPO | Admitting: Internal Medicine

## 2015-12-25 NOTE — Telephone Encounter (Signed)
Spoke with patient today. I am leaving numbers for her to call for patient assistance. Also leaving forms for PA through the pharmaceutical Co.

## 2015-12-25 NOTE — Telephone Encounter (Signed)
Pt is requesting assistance with her copay for Xarelto if possible.  Reviewed the option of changing to Warfarin however she would prefer to stay on Xarelto if possible.  Will forward to [facility] to see if there is anything that can be done to assist her.

## 2015-12-25 NOTE — Telephone Encounter (Signed)
Pt calling re rx for Xarelto -it will cost her  $80  a month-is there something cheaper? [phone]

## 2015-12-28 ENCOUNTER — Telehealth: Payer: Self-pay | Admitting: Cardiology

## 2015-12-28 NOTE — Telephone Encounter (Signed)
New Message  Pt wanted to speak w/ RN to see if she can replace Xarelto w/ Brillinta. Please call back and discuss.

## 2015-12-28 NOTE — Telephone Encounter (Signed)
I spoke with pt and told her she could not replace Xarelto with Brilinta because they work differently.

## 2016-01-16 ENCOUNTER — Other Ambulatory Visit: Payer: Self-pay | Admitting: *Deleted

## 2016-01-16 MED ORDER — RIVAROXABAN 15 MG PO TABS: 15.0000 mg | ORAL_TABLET | Freq: Every day | ORAL | Status: DC

## 2016-01-31 ENCOUNTER — Telehealth: Payer: Self-pay | Admitting: Cardiology

## 2016-01-31 NOTE — Telephone Encounter (Signed)
Spoke with pt who is reporting she has been passing bllod in her urostomy bag since starting Xarelto 15 mg a day in May of this yr.  It is so bad it is staining her foley bag she uses at night.  She stopped her baby ASA and fish oil about 2 weeks ago and has maybe seen some improvement.  She has held her Xarelto dose X 2 in the past 2 weeks b/c of seeing blood.  She is going to hold her atorvastatin as well since she was told there maybe a reaction between the 2.  advised pt I will review with Dr Marlou Porch and we will call her back with orders.  She has not had any blood work recently and last CBC was in the hospital 4/17.  Hemoglobin was 10.1 then.  She is aware she will probably need a repeat CBC.

## 2016-01-31 NOTE — Telephone Encounter (Signed)
New Message  Pt c/o medication issue:  1. Name of Medication: Xarelto   2. How are you currently taking this medication (dosage and times per day)? 15mg / once daily  3. Are you having a reaction (difficulty breathing--STAT)? Blood in urine   4. What is your medication issue? Pt states she has been noticing blood in urine and says she only see the blood after she has taken the Xarelto. Pt also states the blood is dark red.  pt wants further instruction. Please call back to discuss

## 2016-02-01 NOTE — Telephone Encounter (Signed)
I would hold Xarelto for one week. Please discuss with urology. She is at increased risk for stroke when holding Xarelto.  Candee Furbish, MD

## 2016-02-02 NOTE — Telephone Encounter (Signed)
Reviewed information with patient who states understanding. She will hold Xarelto as instructed and make Dr Deterding aware.  She has an appt with him on Monday and will discuss it with him then.  She is requesting Dr Marlou Porch let her know if another agent would be beneficial such as Eliquis and not cause her to bleed.  Aware I will forward information to him to review and call back with any recommendations or orders.

## 2016-02-02 NOTE — Telephone Encounter (Signed)
Lm to cb.

## 2016-02-02 NOTE — Telephone Encounter (Signed)
We may try Eliquis next. Let's see what nepho opinion.   Candee Furbish, MD

## 2016-02-05 DIAGNOSIS — D631 Anemia in chronic kidney disease: Secondary | ICD-10-CM | POA: Diagnosis not present

## 2016-02-05 DIAGNOSIS — I4891 Unspecified atrial fibrillation: Secondary | ICD-10-CM | POA: Diagnosis not present

## 2016-02-05 DIAGNOSIS — E118 Type 2 diabetes mellitus with unspecified complications: Secondary | ICD-10-CM | POA: Diagnosis not present

## 2016-02-05 DIAGNOSIS — I509 Heart failure, unspecified: Secondary | ICD-10-CM | POA: Diagnosis not present

## 2016-02-05 DIAGNOSIS — Z7901 Long term (current) use of anticoagulants: Secondary | ICD-10-CM | POA: Diagnosis not present

## 2016-02-05 DIAGNOSIS — N390 Urinary tract infection, site not specified: Secondary | ICD-10-CM | POA: Diagnosis not present

## 2016-02-05 DIAGNOSIS — R809 Proteinuria, unspecified: Secondary | ICD-10-CM | POA: Diagnosis not present

## 2016-02-05 DIAGNOSIS — I129 Hypertensive chronic kidney disease with stage 1 through stage 4 chronic kidney disease, or unspecified chronic kidney disease: Secondary | ICD-10-CM | POA: Diagnosis not present

## 2016-02-05 DIAGNOSIS — N2581 Secondary hyperparathyroidism of renal origin: Secondary | ICD-10-CM | POA: Diagnosis not present

## 2016-02-05 DIAGNOSIS — N133 Other and unspecified hydronephrosis: Secondary | ICD-10-CM | POA: Diagnosis not present

## 2016-02-05 DIAGNOSIS — E782 Mixed hyperlipidemia: Secondary | ICD-10-CM | POA: Diagnosis not present

## 2016-02-05 DIAGNOSIS — N2589 Other disorders resulting from impaired renal tubular function: Secondary | ICD-10-CM | POA: Diagnosis not present

## 2016-02-05 DIAGNOSIS — R319 Hematuria, unspecified: Secondary | ICD-10-CM | POA: Diagnosis not present

## 2016-02-05 DIAGNOSIS — N183 Chronic kidney disease, stage 3 (moderate): Secondary | ICD-10-CM | POA: Diagnosis not present

## 2016-02-09 NOTE — Telephone Encounter (Signed)
Left another message for pt to c/b to discuss.

## 2016-02-14 NOTE — Telephone Encounter (Signed)
Have been unable to contact pt by phone and she is not calling back.  Will await her to return the call once she has seen nephrology.

## 2016-02-21 ENCOUNTER — Encounter: Payer: Self-pay | Admitting: Cardiology

## 2016-02-22 DIAGNOSIS — I129 Hypertensive chronic kidney disease with stage 1 through stage 4 chronic kidney disease, or unspecified chronic kidney disease: Secondary | ICD-10-CM | POA: Diagnosis not present

## 2016-03-07 ENCOUNTER — Telehealth: Payer: Self-pay | Admitting: Cardiology

## 2016-03-07 NOTE — Telephone Encounter (Signed)
LM to CB to discuss.  Per Dr Marlou Porch notes pt's nephrologist is to discuss when she should restart and which agent she should restart on.

## 2016-03-07 NOTE — Telephone Encounter (Signed)
Spoke with pt who is asking to be restarted on Xarelto.  Advised per Dr Marlou Porch she should f/u with Dr Deterding as she was having blood in her urine and he was treating her for that.  She states she is only on an ASA right now but still thinks she is having some blood in her urine.  Advised to contact Dr Deterding to eval and treat as necessary.  She states understanding.

## 2016-03-07 NOTE — Telephone Encounter (Signed)
Follow up ° ° °Pt verbalized that she is returning call for rn °

## 2016-03-07 NOTE — Telephone Encounter (Signed)
New message     Pt calling to speak to the nurse about stopping Xarelto .She would like to know when and if she can start it again? Please call.

## 2016-03-08 ENCOUNTER — Encounter: Payer: Self-pay | Admitting: Family

## 2016-03-12 ENCOUNTER — Ambulatory Visit (INDEPENDENT_AMBULATORY_CARE_PROVIDER_SITE_OTHER): Payer: PPO | Admitting: Family

## 2016-03-12 ENCOUNTER — Ambulatory Visit (HOSPITAL_COMMUNITY)
Admission: RE | Admit: 2016-03-12 | Discharge: 2016-03-12 | Disposition: A | Discharge status: Home / Self Care | Payer: PPO | Source: Ambulatory Visit | Attending: Family | Admitting: Family

## 2016-03-12 ENCOUNTER — Encounter: Payer: Self-pay | Admitting: Family

## 2016-03-12 ENCOUNTER — Ambulatory Visit (INDEPENDENT_AMBULATORY_CARE_PROVIDER_SITE_OTHER)
Admission: RE | Admit: 2016-03-12 | Discharge: 2016-03-12 | Disposition: A | Discharge status: Home / Self Care | Payer: PPO | Source: Ambulatory Visit | Attending: Family | Admitting: Family

## 2016-03-12 VITALS — BP 138/70 | HR 70 | Temp 98.5°F | Ht 63.0 in | Wt 203.0 lb

## 2016-03-12 DIAGNOSIS — Z95828 Presence of other vascular implants and grafts: Secondary | ICD-10-CM

## 2016-03-12 DIAGNOSIS — Z89511 Acquired absence of right leg below knee: Secondary | ICD-10-CM

## 2016-03-12 DIAGNOSIS — I8312 Varicose veins of left lower extremity with inflammation: Secondary | ICD-10-CM

## 2016-03-12 DIAGNOSIS — E1151 Type 2 diabetes mellitus with diabetic peripheral angiopathy without gangrene: Secondary | ICD-10-CM | POA: Diagnosis not present

## 2016-03-12 DIAGNOSIS — I779 Disorder of arteries and arterioles, unspecified: Secondary | ICD-10-CM | POA: Insufficient documentation

## 2016-03-12 DIAGNOSIS — R0989 Other specified symptoms and signs involving the circulatory and respiratory systems: Secondary | ICD-10-CM | POA: Diagnosis not present

## 2016-03-12 DIAGNOSIS — I10 Essential (primary) hypertension: Secondary | ICD-10-CM | POA: Diagnosis not present

## 2016-03-12 NOTE — Patient Instructions (Signed)
Peripheral Vascular Disease Peripheral vascular disease (PVD) is a disease of the blood vessels that are not part of your heart and brain. A simple term for PVD is poor circulation. In most cases, PVD narrows the blood vessels that carry blood from your heart to the rest of your body. This can result in a decreased supply of blood to your arms, legs, and internal organs, like your stomach or kidneys. However, it most often affects a person's lower legs and feet. There are two types of PVD.  Organic PVD. This is the more common type. It is caused by damage to the structure of blood vessels.  Functional PVD. This is caused by conditions that make blood vessels contract and tighten (spasm). Without treatment, PVD tends to get worse over time. PVD can also lead to acute ischemic limb. This is when an arm or limb suddenly has trouble getting enough blood. This is a medical emergency. CAUSES Each type of PVD has many different causes. The most common cause of PVD is buildup of a fatty material (plaque) inside of your arteries (atherosclerosis). Small amounts of plaque can break off from the walls of the blood vessels and become lodged in a smaller artery. This blocks blood flow and can cause acute ischemic limb. Other common causes of PVD include:  Blood clots that form inside of blood vessels.  Injuries to blood vessels.  Diseases that cause inflammation of blood vessels or cause blood vessel spasms.  Health behaviors and health history that increase your risk of developing PVD. RISK FACTORS  You may have a greater risk of PVD if you:  Have a family history of PVD.  Have certain medical conditions, including:  High cholesterol.  Diabetes.  High blood pressure (hypertension).  Coronary heart disease.  Past problems with blood clots.  Past injury, such as burns or a broken bone. These may have damaged blood vessels in your limbs.  Buerger disease. This is caused by inflamed blood  vessels in your hands and feet.  Some forms of arthritis.  Rare birth defects that affect the arteries in your legs.  Use tobacco.  Do not get enough exercise.  Are obese.  Are age 50 or older. SIGNS AND SYMPTOMS  PVD may cause many different symptoms. Your symptoms depend on what part of your body is not getting enough blood. Some common signs and symptoms include:  Cramps in your lower legs. This may be a symptom of poor leg circulation (claudication).  Pain and weakness in your legs while you are physically active that goes away when you rest (intermittent claudication).  Leg pain when at rest.  Leg numbness, tingling, or weakness.  Coldness in a leg or foot, especially when compared with the other leg.  Skin or hair changes. These can include:  Hair loss.  Shiny skin.  Pale or bluish skin.  Thick toenails.  Inability to get or maintain an erection (erectile dysfunction). People with PVD are more prone to developing ulcers and sores on their toes, feet, or legs. These may take longer than normal to heal. DIAGNOSIS Your health care provider may diagnose PVD from your signs and symptoms. The health care provider will also do a physical exam. You may have tests to find out what is causing your PVD and determine its severity. Tests may include:  Blood pressure recordings from your arms and legs and measurements of the strength of your pulses (pulse volume recordings).  Imaging studies using sound waves to take pictures of   the blood flow through your blood vessels (Doppler ultrasound).  Injecting a dye into your blood vessels before having imaging studies using:  X-rays (angiogram or arteriogram).  Computer-generated X-rays (CT angiogram).  A powerful electromagnetic field and a computer (magnetic resonance angiogram or MRA). TREATMENT Treatment for PVD depends on the cause of your condition and the severity of your symptoms. It also depends on your age. Underlying  causes need to be treated and controlled. These include long-lasting (chronic) conditions, such as diabetes, high cholesterol, and high blood pressure. You may need to first try making lifestyle changes and taking medicines. Surgery may be needed if these do not work. Lifestyle changes may include:  Quitting smoking.  Exercising regularly.  Following a low-fat, low-cholesterol diet. Medicines may include:  Blood thinners to prevent blood clots.  Medicines to improve blood flow.  Medicines to improve your blood cholesterol levels. Surgical procedures may include:  A procedure that uses an inflated balloon to open a blocked artery and improve blood flow (angioplasty).  A procedure to put in a tube (stent) to keep a blocked artery open (stent implant).  Surgery to reroute blood flow around a blocked artery (peripheral bypass surgery).  Surgery to remove dead tissue from an infected wound on the affected limb.  Amputation. This is surgical removal of the affected limb. This may be necessary in cases of acute ischemic limb that are not improved through medical or surgical treatments. HOME CARE INSTRUCTIONS  Take medicines only as directed by your health care provider.  Do not use any tobacco products, including cigarettes, chewing tobacco, or electronic cigarettes. If you need help quitting, ask your health care provider.  Lose weight if you are overweight, and maintain a healthy weight as directed by your health care provider.  Eat a diet that is low in fat and cholesterol. If you need help, ask your health care provider.  Exercise regularly. Ask your health care provider to suggest some good activities for you.  Use compression stockings or other mechanical devices as directed by your health care provider.  Take good care of your feet.  Wear comfortable shoes that fit well.  Check your feet often for any cuts or sores. SEEK MEDICAL CARE IF:  You have cramps in your legs  while walking.  You have leg pain when you are at rest.  You have coldness in a leg or foot.  Your skin changes.  You have erectile dysfunction.  You have cuts or sores on your feet that are not healing. SEEK IMMEDIATE MEDICAL CARE IF:  Your arm or leg turns cold and blue.  Your arms or legs become red, warm, swollen, painful, or numb.  You have chest pain or trouble breathing.  You suddenly have weakness in your face, arm, or leg.  You become very confused or lose the ability to speak.  You suddenly have a very bad headache or lose your vision.   This information is not intended to replace advice given to you by your health care provider. Make sure you discuss any questions you have with your health care provider.   Document Released: 09/05/2004 Document Revised: 08/19/2014 Document Reviewed: 01/06/2014 Elsevier Interactive Patient Education 2016 [employer]  

## 2016-03-12 NOTE — Progress Notes (Signed)
[facility] [place]   CC: Follow up peripheral artery occlusive disease  History of Present Illness [name] is a 75 y.o. female patient of Dr. Donnetta Hutching who returns for follow-up s/p left femoral to anterior tibial bypass in February 2016. She continues to have some changes consistent with venous stasis disease on her left calf, the ulcers have healed. She is completely healed her left toe amputation. She is s/p right BKA in 2008, she walks with a prosthesis and a cane. She states that she uses her w/c at home as she does not want to fall.  She does not walk enough to elicit claudication sx's other than when she goes to church.   She sees Dr. Jimmy Footman, nephrologist.   She has a urostomy as a result of bladder cancer surgery.   The patient denies New Medical or Surgical History.  Pt Diabetic: Yes, 14.8 A1C on 11/18/15 (reveiw of records), pt states her sugar has improved since then as she has a good supply insulin now. She had trouble affording her insulin while she was in the donut hole.   Pt smoker: former smoker, quit in 1977, smoked for about 13 years  Pt meds include: Statin : yes Betablocker: Yes ASA: Yes Other anticoagulants/antiplatelets: no  Pt was discharged 04/17 from [facility] after admission for sepsis, had afib RVR, tachybrady w/ 6 sec pauses in the setting of hypoxia.  ETT. EF nl, amio added, pt diuresed, CHADS2VASC=4, on Xarelto. Amio 200 mg BID for one week then 200 mg per day (review of cardiology follow up visit).   Pt describes developing hematuria at home; pt states Dr. Marlou Porch office advised her to stop the Xarelto.  Pt states Dr. Marlou Porch office told her that Dr. Jimmy Footman was to advise her whether or not to resume Xarelto, states she was not advised by Dr. Jimmy Footman re this and she saw him 2-3 weeks ago.  I advised pt to ask Dr. Deterding's office for guidance re this.    Past Medical History:  Diagnosis Date  . Anemia   . Blood  transfusion   . CAD (coronary artery disease) 01/26/2013   Evidently circumflex stent in 2014 following bladder cancer surgery for old myocardial infarction.   . CKD (chronic kidney disease) stage 3, GFR 30-59 ml/min 01/26/2013  . GERD (gastroesophageal reflux disease)   . Glaucoma   . History of atrial tachycardia   . History of bladder cancer TCC AND CIS  . Hyperlipidemia   . Hypertension   . Macular degeneration of both eyes   . Myocardial infarction ([place])   . Obesity (BMI 30-39.9)   . Peripheral neuropathy (HCC) LEGS AND HANDS  . Peripheral vascular disease (HCC) S/P RIGHT BKA  . Presence of urostomy ([place])   . Retinopathy due to secondary diabetes mellitus ([place])   . 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 Type 2 diabetes mellitus with vascular disease [facility])     Social History Social History  Substance Use Topics  . Smoking status: Former Smoker    Packs/day: 2.00    Years: 17.00    Types: Cigarettes    Quit date: 10/09/1975  . Smokeless tobacco: Never Used  . Alcohol use 0.0 oz/week     Comment: RARE    Family History Family History  Problem Relation Age of Onset  . Heart failure Mother     died age 59  . Sudden death Father 82  . Heart disease Father   .  Hypertension Father   . Heart attack Father   . Diabetes Brother   . Heart attack Brother   . AAA (abdominal aortic aneurysm) Brother     Past Surgical History:  Procedure Laterality Date  . ABDOMINAL ANGIOGRAM N/A 09/08/2014   Procedure: ABDOMINAL ANGIOGRAM;  Surgeon: Conrad Hitchcock, MD;  Location: [facility] CATH LAB;  Service: Cardiovascular;  Laterality: N/A;  . ABDOMINAL HYSTERECTOMY    . AMPUTATION Left 09/14/2014   Procedure: LEFT FOOT FIRST RAY AMPUTATION;  Surgeon: Mcarthur Rossetti, MD;  Location: [place];  Service: Orthopedics;  Laterality: Left;  . APPENDECTOMY  1963  . [place]  . BELOW KNEE LEG AMPUTATION  05-02-2007   RIGHT  . CATARACT EXTRACTION W/ INTRAOCULAR  LENS  IMPLANT, BILATERAL    . CORONARY STENT PLACEMENT    . CYSTO / RESECTION BLADDER BX'S  04-01-11  &  11-26-10  . CYSTOSCOPY WITH BIOPSY  10/14/2011   Procedure: CYSTOSCOPY WITH BIOPSY;  Surgeon: Claybon Jabs, MD;  [facility]  Service: Urology;  Laterality: N/A;  BLADDER BIOPSY  . FEMORAL-POPLITEAL BYPASS GRAFT Left 09/12/2014   Procedure:  LEFT FEMORAL-POPLITEAL ARTERY BYPASS GRAFT USING NON REVERSE LEFT GREATER SAPHENOUS VEIN;  Surgeon: Rosetta Posner, MD;  Location: [place];  Service: Vascular;  Laterality: Left;  . I & D RIGHT BELOW KNEE AMPUTATION WOUND  06-10-2011  . LAPAROSCOPIC CHOLECYSTECTOMY  12-10-1999  . REVISION UROSTOMY CUTANEOUS    . REVISION UROSTOMY CUTANEOUS    . RIGHT FOOT I & D / REMOVAL NECROTIC BONE  JUN  &  AUG 2008  . RIGHT GREAT TOE AMPUTATION  11-05-2006    Allergies  Allergen Reactions  . Gabapentin Other (See Comments)    Extremely drunk and lethargic  . Lisinopril Other (See Comments)    Increased creatinine levels (pt currently taking low dose)  . Metformin And Related Other (See Comments)    Increased creatinine levels  . Biaxin [Clarithromycin] Itching    Current Outpatient Prescriptions  Medication Sig Dispense Refill  . acetaminophen (TYLENOL) 500 MG tablet Take 1,000 mg by mouth every 6 (six) hours as needed for mild pain.     Marland Kitchen amiodarone (PACERONE) 200 MG tablet Take 1 tablet (200 mg total) by mouth daily. 30 tablet 6  . aspirin EC 81 MG tablet Take 81 mg by mouth every evening.    Marland Kitchen atorvastatin (LIPITOR) 80 MG tablet TAKE 1 TABLET (80 MG TOTAL) BY MOUTH EVERY EVENING. 90 tablet 2  . furosemide (LASIX) 40 MG tablet Take 80 mg by mouth 3 (three) times daily. Takes all doses as often as she can.    . insulin glargine (LANTUS) 100 UNIT/ML injection Inject 0.3 mLs (30 Units total) into the skin 2 (two) times daily. 55 units in the morning and 60 units at night 10 mL 11  . insulin lispro (HUMALOG) 100 UNIT/ML injection Inject  20-50 Units into the skin daily as needed for high blood sugar. PER SLIDING SCALE    . isosorbide mononitrate (IMDUR) 30 MG 24 hr tablet TAKE 1 TABLET (30 MG TOTAL) BY MOUTH DAILY. 30 tablet 7  . Magnesium 300 MG CAPS Take 300 mg by mouth daily.    . Melatonin 10 MG TABS Take 10 mg by mouth at bedtime.     . metoprolol succinate (TOPROL-XL) 100 MG 24 hr tablet TAKE 1 TABLET (100 MG TOTAL) BY MOUTH EVERY EVENING. TAKE WITH OR  IMMEDIATELY FOLLOWING A MEAL. 90 tablet 2  . Multiple Vitamins-Minerals (PRESERVISION AREDS PO) Take 1 capsule by mouth 2 (two) times daily.    . nitroGLYCERIN (NITROSTAT) 0.6 MG SL tablet Take 0.6 mg by mouth every 5 (five) minutes x 3 doses as needed.    Marland Kitchen omeprazole (PRILOSEC) 20 MG capsule Take 20 mg by mouth every evening.     . polyethylene glycol (MIRALAX / GLYCOLAX) packet Take 17 g by mouth as needed.    . potassium chloride SA (K-DUR,KLOR-CON) 20 MEQ tablet Take 20 mEq by mouth daily.     . Psyllium (VEGETABLE LAXATIVE PO) Take 4 tablets by mouth at bedtime.    . senna (SENOKOT) 8.6 MG TABS tablet Take 1 tablet by mouth See admin instructions. Takes 1 tab daily and can take addt'l tab as needed for constipation    . sodium bicarbonate 650 MG tablet Take 1,300 mg by mouth 2 (two) times daily.    . cephALEXin (KEFLEX) 500 MG capsule Take 1 capsule by mouth 2 (two) times daily.    Marland Kitchen oxyCODONE-acetaminophen (PERCOCET) 5-325 MG tablet Take 1-2 tablets by mouth every 6 (six) hours as needed for severe pain. (Patient not taking: Reported on 03/12/2016) 20 tablet 0  . Rivaroxaban (XARELTO) 15 MG TABS tablet Take 1 tablet (15 mg total) by mouth daily with supper. (Patient not taking: Reported on 03/12/2016) 90 tablet 3   No current facility-administered medications for this visit.     ROS: See HPI for pertinent positives and negatives.   Physical Examination  Vitals:   03/12/16 1542  BP: 138/70  Pulse: 70  Temp: 98.5 F (36.9 C)  TempSrc: Oral  SpO2: 97%  Weight:  203 lb (92.1 kg)  Height: 5\' 3"  (1.6 m)   Body mass index is 35.96 kg/m.  General: A&O x 3, WDWN, obese female. Gait: slow, deliberate, using cane and right BKA prosthesis Eyes: PERRLA. Pulmonary: CTAB but distant breath sounds, respirations are non labored, no wheezes, rales, or rhonchi. Cardiac: regular rhythm, no detected murmur.         Carotid Bruits Right Left   Negative positive  Aorta is not palpable. Radial pulses: 2+ bilateral                           VASCULAR EXAM: Extremities without ischemic changes, without Gangrene; without open wounds. Right BKA with prosthesis in place. Left calf with chronic venous stasis dermatitis, no ulcers. Left great toe is surgically absent. Left lower leg and foot with 1-2+ pitting and non pitting edema.  LE Pulses Right Left       FEMORAL  not palpable   palpable       POPLITEAL  not palpable  not palpable       POSTERIOR TIBIAL  BKA  1+ palpable       DORSALIS PEDIS      ANTERIOR TIBIAL BKA not palpable   Abdomen: soft, large panus, NT, no palpable masses. Urostomy with appliance in place. Skin: no rashes, see Extremities. Musculoskeletal: Generalized deconditioning, see Extremities.                   Neurologic: A&O X 3; Appropriate Affect ; SENSATION:diminished in left foot; MOTOR FUNCTION:  moving all extremities equally, motor strength 4/5 in upper extremities, 3/5 in lower extremities. Speech is fluent/normal. CN 2-12 intact.     ASSESSMENT: [name] is a 75 y.o. female who is s/p left femoral to anterior tibial bypass in February 2016, and  s/p right BKA in 2008. She continues to have some changes consistent with venous stasis disease on her left calf, the ulcers have healed. she walks with a prosthesis and a cane.  She states that she uses her w/c at home as she does not want to fall.  She does not walk enough to elicit claudication sx's other than when she goes to church.  Today's left LE arterial duplex suggests a widely patent graft w/o significant stenosis or hyperplasia. Right BKA.  No significant change compared to the exam of 08/29/15.  Left ABI is normal with tri and biphasic waveforms.  Her left PT pulse is palpable.  Daily seated left leg exercises demonstrated and discussed.   Left carotid bruit present today, not heard at last exam. Will check carotid duplex on her return in 6 months for left leg arterial studies.  There are no carotid duplex results on file, she has no hx of stroke or TIA.  Chronic venous stasis dermatitis and mild/moderate edema: left calf stasis ulcer has healed, dermatitis changes present. She usually wears her left knee high compression hose which she states has helped prevent more ulcers.  Adequate elevation of her left leg discussed to minimize edema: left foot above her heart overnight and for at least 20 minutes, 3-4 times during the day.  Her primary atherosclerotic risk factor remains her uncontrolled DM, with A1C three months ago over 14. Since then she has been working closely with her PCP to get her hyperglycemia under better control and states she has made progress. Her other atherosclerotic risk factors include 13 years smoking hx, quit in 1977, CKD, CAD, dyslipidemia, and obesity. She was taking Xarelto for hx of atrial fib, but stopped due to hematuria and on advice of Dr. Kingsley Plan office, per pt. She takes a statin, ASA, and a beta blocker.   Pt will seek advice from Dr. Jimmy Footman and Dr. Kingsley Plan office re possible resumption of Xarelto.   Face to face time with patient was 25 minutes. Over 50% of this time was spent on counseling and coordination of care.    PLAN:  Based on the patient's vascular studies and examination, pt will return to clinic in 6  months with ABI's, left LE arterial duplex, and carotid duplex.   I discussed in depth with the patient the nature of atherosclerosis, and emphasized the importance of maximal medical management including strict control of blood pressure, blood glucose, and lipid levels, obtaining regular exercise, and continued cessation of smoking.  The patient is aware that  without maximal medical management the underlying atherosclerotic disease process will progress, limiting the benefit of any interventions.  The patient was given information about PAD including signs, symptoms, treatment, what symptoms should prompt the patient to seek immediate medical care, and risk reduction measures to take.  Clemon Chambers, RN, MSN, FNP-C [facility] Phone: [phone]  Clinic MD: Scot Dock on call  03/12/16 4:08 PM

## 2016-03-13 ENCOUNTER — Telehealth: Payer: Self-pay | Admitting: Cardiology

## 2016-03-13 NOTE — Telephone Encounter (Signed)
New message  Pt c/o medication issue:  1. Name of Medication: Xarelto  2. How are you currently taking this medication (dosage and times per day)? 15mg    3. Are you having a reaction (difficulty breathing--STAT)? Per pt no  4. What is your medication issue? Pt call requesting to speak with RN about med. Pt states she just had questions about the medication.

## 2016-03-13 NOTE — Telephone Encounter (Signed)
Pt states she was taken off Xarelto 15 mg a few times for two weeks at the time due to bleed  in urine. Pt has been off Xarelto  for about 5 weeks now. Pt did called on 7/27 to find out when she needed to restart the Xarelto again , she was told that her nephrologist needed to decided when pt should start. Pt states when the nurse gave this information she had already have seen the nephrologist. Last night pt started having chest and left arm pain just like when she had prior going to the hospital ER last time. Pt took the 15 mg xarelto last night,  she felt better, but this morning she had blood in the urine , she continues to have bloody urine now. No other symptoms. Pt would like to know if Dr Marlou Porch would prescribed other blood thinned like Eliquis to see if she can tolerate it. Pt is afraid to have a stroke without any blood thinner. Pt also would like to know why the Nephrologist is the one to decide when to start the blood thinner medication.

## 2016-03-14 NOTE — Telephone Encounter (Signed)
Spoke with pt at length about bleeding and the need for further evaluation as to why.  She has seen Dr Karsten Ro in the past and states she will f/u with him.  She reports that she has had a lot of bleeding since she took that one pill the other day.  Advised pt not to take any more until we know why and what is causing the bleeding.

## 2016-03-14 NOTE — Telephone Encounter (Signed)
Continues to have bleeding with anticoagulation (Xarelto 15) I am fine trying Eliquis 5mg  PO BID  Refer to urology if this has not already occurred. May have a bleeding source in GU system that needs attention.  Try to explain that we in cardiology want to use anticoagulation without interruption. It is her bleeding that is halting use. We need the help of urology to determine reason for bleeding and help guide [place] when it is safe to restart anticoagulation.   Candee Furbish, MD

## 2016-03-20 ENCOUNTER — Ambulatory Visit: Payer: PPO | Admitting: Cardiology

## 2016-03-21 ENCOUNTER — Telehealth: Payer: Self-pay | Admitting: Cardiology

## 2016-03-21 DIAGNOSIS — E781 Pure hyperglyceridemia: Secondary | ICD-10-CM | POA: Diagnosis not present

## 2016-03-21 DIAGNOSIS — Z8551 Personal history of malignant neoplasm of bladder: Secondary | ICD-10-CM | POA: Diagnosis not present

## 2016-03-21 DIAGNOSIS — E1322 Other specified diabetes mellitus with diabetic chronic kidney disease: Secondary | ICD-10-CM | POA: Diagnosis not present

## 2016-03-21 DIAGNOSIS — Z7984 Long term (current) use of oral hypoglycemic drugs: Secondary | ICD-10-CM | POA: Diagnosis not present

## 2016-03-21 DIAGNOSIS — E782 Mixed hyperlipidemia: Secondary | ICD-10-CM | POA: Diagnosis not present

## 2016-03-21 DIAGNOSIS — Z9889 Other specified postprocedural states: Secondary | ICD-10-CM | POA: Diagnosis not present

## 2016-03-21 DIAGNOSIS — E119 Type 2 diabetes mellitus without complications: Secondary | ICD-10-CM | POA: Diagnosis not present

## 2016-03-21 DIAGNOSIS — R319 Hematuria, unspecified: Secondary | ICD-10-CM | POA: Diagnosis not present

## 2016-03-21 DIAGNOSIS — I10 Essential (primary) hypertension: Secondary | ICD-10-CM | POA: Diagnosis not present

## 2016-03-21 DIAGNOSIS — I480 Paroxysmal atrial fibrillation: Secondary | ICD-10-CM | POA: Diagnosis not present

## 2016-03-21 DIAGNOSIS — N183 Chronic kidney disease, stage 3 (moderate): Secondary | ICD-10-CM | POA: Diagnosis not present

## 2016-03-21 DIAGNOSIS — Z89511 Acquired absence of right leg below knee: Secondary | ICD-10-CM | POA: Diagnosis not present

## 2016-03-21 NOTE — Telephone Encounter (Signed)
New Message  [place] from [place] verbalized MD Drema Dallas would like to speak with MD Marlou Porch about mutual pt.  [place] verbalized it's urgent.  MD Barnes cell phone number [phone]

## 2016-03-21 NOTE — Telephone Encounter (Signed)
Left message for Dr Drema Dallas on her voicemail - pt had been asked to see urologist back to determine if there is any reason for her continued bleeding after 1 dose of Xarelto 15 mg.  Advised I will forward this to Dr Marlou Porch to call her back in the AM however if she needs something tonight she can call the MD on call.

## 2016-03-22 NOTE — Telephone Encounter (Signed)
Left message for Dr Drema Dallas yesterday to return the call if further questions.

## 2016-03-25 ENCOUNTER — Encounter: Payer: Self-pay | Admitting: Cardiology

## 2016-03-25 NOTE — Telephone Encounter (Signed)
Agree.  Mark Skains, MD  

## 2016-03-28 ENCOUNTER — Encounter: Payer: Self-pay | Admitting: Cardiology

## 2016-04-08 DIAGNOSIS — Z932 Ileostomy status: Secondary | ICD-10-CM | POA: Diagnosis not present

## 2016-04-08 DIAGNOSIS — D090 Carcinoma in situ of bladder: Secondary | ICD-10-CM | POA: Diagnosis not present

## 2016-04-08 DIAGNOSIS — R310 Gross hematuria: Secondary | ICD-10-CM | POA: Diagnosis not present

## 2016-04-10 ENCOUNTER — Encounter: Payer: Self-pay | Admitting: Cardiology

## 2016-04-10 ENCOUNTER — Ambulatory Visit (INDEPENDENT_AMBULATORY_CARE_PROVIDER_SITE_OTHER): Payer: PPO | Admitting: Cardiology

## 2016-04-10 VITALS — BP 140/86 | HR 62 | Ht 63.0 in | Wt 208.1 lb

## 2016-04-10 DIAGNOSIS — I779 Disorder of arteries and arterioles, unspecified: Secondary | ICD-10-CM

## 2016-04-10 DIAGNOSIS — E1151 Type 2 diabetes mellitus with diabetic peripheral angiopathy without gangrene: Secondary | ICD-10-CM

## 2016-04-10 DIAGNOSIS — I480 Paroxysmal atrial fibrillation: Secondary | ICD-10-CM

## 2016-04-10 MED ORDER — APIXABAN 5 MG PO TABS: 5.0000 mg | ORAL_TABLET | Freq: Two times a day (BID) | ORAL | 11 refills | Status: DC

## 2016-04-10 NOTE — Progress Notes (Signed)
Cardiology Office Note    Date:  04/10/2016   ID:  [name], DOB 30-Dec-1940, MRN [id number]  PCP:  Gerrit Heck, MD  Cardiologist:   Candee Furbish, MD     History of Present Illness:  [name] is a 75 y.o. female with coronary artery disease status post myocardial infarction in January 2014 following bladder surgery at [facility], atrial fibrillation, uncontrolled diabetes, obtuse marginal stent, hypertension, hyperlipidemia, PVD, right BKA, chronic kidney disease stage 3-4, urology Dr. Tresa Moore.  She was battling with hematuria from her chronic urostomy bag. She saw Dr. Tresa Moore on 04/09/16. He reassured her that a small amount of blood can cause significant redness to her bag. He would only be concerned or more concerned if there was thickness in the bag i.e. coagulated blood.  She was having trouble every time she took the lower dose Xarelto 15 mg.  Prior hospitalizations 01/26/13 with chest pain, radiation to left arm with shortness of breath. Troponin was normal, EKG showed subtle ST segment depression in inferior leads. Echocardiogram showed normal EF, nuclear stress test showed no definitive evidence of prior infarction or ischemia. Attenuation artifact noted. Large area of attenuation involving the lateral wall from prior infarct. Medical management.  She has been battling ulcerative wounds on her left foot. Prior right BKA. Prior skin grafting.    Past Medical History:  Diagnosis Date  . Anemia   . Blood transfusion   . CAD (coronary artery disease) 01/26/2013   Evidently circumflex stent in 2014 following bladder cancer surgery for old myocardial infarction.   . CKD (chronic kidney disease) stage 3, GFR 30-59 ml/min 01/26/2013  . GERD (gastroesophageal reflux disease)   . Glaucoma   . History of atrial tachycardia   . History of bladder cancer TCC AND CIS  . Hyperlipidemia   . Hypertension   . Macular degeneration of both eyes   . Myocardial infarction ([place])    . Obesity (BMI 30-39.9)   . Peripheral neuropathy (HCC) LEGS AND HANDS  . Peripheral vascular disease (HCC) S/P RIGHT BKA  . Presence of urostomy ([place])   . Retinopathy due to secondary diabetes mellitus ([place])   . 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 Type 2 diabetes mellitus with vascular disease [facility])     Past Surgical History:  Procedure Laterality Date  . ABDOMINAL ANGIOGRAM N/A 09/08/2014   Procedure: ABDOMINAL ANGIOGRAM;  Surgeon: Conrad Hart, MD;  Location: [facility] CATH LAB;  Service: Cardiovascular;  Laterality: N/A;  . ABDOMINAL HYSTERECTOMY    . AMPUTATION Left 09/14/2014   Procedure: LEFT FOOT FIRST RAY AMPUTATION;  Surgeon: Mcarthur Rossetti, MD;  Location: [place];  Service: Orthopedics;  Laterality: Left;  . APPENDECTOMY  1963  . [place]  . BELOW KNEE LEG AMPUTATION  05-02-2007   RIGHT  . CATARACT EXTRACTION W/ INTRAOCULAR LENS  IMPLANT, BILATERAL    . CORONARY STENT PLACEMENT    . CYSTO / RESECTION BLADDER BX'S  04-01-11  &  11-26-10  . CYSTOSCOPY WITH BIOPSY  10/14/2011   Procedure: CYSTOSCOPY WITH BIOPSY;  Surgeon: Claybon Jabs, MD;  Location: [facility];  Service: Urology;  Laterality: N/A;  BLADDER BIOPSY  . FEMORAL-POPLITEAL BYPASS GRAFT Left 09/12/2014   Procedure:  LEFT FEMORAL-POPLITEAL ARTERY BYPASS GRAFT USING NON REVERSE LEFT GREATER SAPHENOUS VEIN;  Surgeon: Rosetta Posner, MD;  Location: [place];  Service: Vascular;  Laterality: Left;  .  I & D RIGHT BELOW KNEE AMPUTATION WOUND  06-10-2011  . LAPAROSCOPIC CHOLECYSTECTOMY  12-10-1999  . REVISION UROSTOMY CUTANEOUS    . REVISION UROSTOMY CUTANEOUS    . RIGHT FOOT I & D / REMOVAL NECROTIC BONE  JUN  &  AUG 2008  . RIGHT GREAT TOE AMPUTATION  11-05-2006    Current Medications: Outpatient Medications Prior to Visit  Medication Sig Dispense Refill  . acetaminophen (TYLENOL) 500 MG tablet Take 1,000 mg by mouth every 6 (six) hours as needed for mild pain.       Marland Kitchen amiodarone (PACERONE) 200 MG tablet Take 1 tablet (200 mg total) by mouth daily. 30 tablet 6  . atorvastatin (LIPITOR) 80 MG tablet TAKE 1 TABLET (80 MG TOTAL) BY MOUTH EVERY EVENING. 90 tablet 2  . furosemide (LASIX) 40 MG tablet Take 80 mg by mouth 2 (two) times daily. Takes all doses as often as she can.     . insulin glargine (LANTUS) 100 UNIT/ML injection Inject 0.3 mLs (30 Units total) into the skin 2 (two) times daily. 55 units in the morning and 60 units at night 10 mL 11  . insulin lispro (HUMALOG) 100 UNIT/ML injection Inject 20-50 Units into the skin daily as needed for high blood sugar. PER SLIDING SCALE    . isosorbide mononitrate (IMDUR) 30 MG 24 hr tablet TAKE 1 TABLET (30 MG TOTAL) BY MOUTH DAILY. 30 tablet 7  . Magnesium 300 MG CAPS Take 300 mg by mouth daily.    . Melatonin 10 MG TABS Take 10 mg by mouth at bedtime.     . metoprolol succinate (TOPROL-XL) 100 MG 24 hr tablet TAKE 1 TABLET (100 MG TOTAL) BY MOUTH EVERY EVENING. TAKE WITH OR IMMEDIATELY FOLLOWING A MEAL. 90 tablet 2  . Multiple Vitamins-Minerals (PRESERVISION AREDS PO) Take 1 capsule by mouth 2 (two) times daily.    . nitroGLYCERIN (NITROSTAT) 0.6 MG SL tablet Take 0.6 mg by mouth every 5 (five) minutes x 3 doses as needed.    Marland Kitchen omeprazole (PRILOSEC) 20 MG capsule Take 20 mg by mouth every evening.     . polyethylene glycol (MIRALAX / GLYCOLAX) packet Take 17 g by mouth as needed.    . potassium chloride SA (K-DUR,KLOR-CON) 20 MEQ tablet Take 20 mEq by mouth daily.     . Psyllium (VEGETABLE LAXATIVE PO) Take 4 tablets by mouth at bedtime.    . senna (SENOKOT) 8.6 MG TABS tablet Take 1 tablet by mouth See admin instructions. Takes 1 tab daily and can take addt'l tab as needed for constipation    . sodium bicarbonate 650 MG tablet Take 1,300 mg by mouth 2 (two) times daily.    Marland Kitchen aspirin EC 81 MG tablet Take 81 mg by mouth every evening.    . cephALEXin (KEFLEX) 500 MG capsule Take 1 capsule by mouth 2 (two) times  daily.    Marland Kitchen oxyCODONE-acetaminophen (PERCOCET) 5-325 MG tablet Take 1-2 tablets by mouth every 6 (six) hours as needed for severe pain. (Patient not taking: Reported on 03/12/2016) 20 tablet 0  . Rivaroxaban (XARELTO) 15 MG TABS tablet Take 1 tablet (15 mg total) by mouth daily with supper. (Patient not taking: Reported on 03/12/2016) 90 tablet 3   No facility-administered medications prior to visit.      Allergies:   Gabapentin; Lisinopril; Metformin and related; and Biaxin [clarithromycin]   Social History   Social History  . Marital status: Divorced    Spouse name: N/A  .  Number of children: N/A  . Years of education: N/A   Social History Main Topics  . Smoking status: Former Smoker    Packs/day: 2.00    Years: 17.00    Types: Cigarettes    Quit date: 10/09/1975  . Smokeless tobacco: Never Used  . Alcohol use 0.0 oz/week     Comment: RARE  . Drug use: No  . Sexual activity: Not Asked   Other Topics Concern  . None   Social History Narrative  . None     Family History:  The patient's family history includes AAA (abdominal aortic aneurysm) in her brother; Diabetes in her brother; Heart attack in her brother and father; Heart disease in her father; Heart failure in her mother; Hypertension in her father; Sudden death (age of onset: 20) in her father.   ROS:   Please see the history of present illness.    ROS All other systems reviewed and are negative.   PHYSICAL EXAM:   VS:  BP 140/86   Pulse 62   Ht 5\' 3"  (1.6 m)   Wt 208 lb 1.9 oz (94.4 kg)   BMI 36.87 kg/m    GEN: Well nourished, well developed, in no acute distress In wheelchair HEENT: normal  Neck: no JVD, carotid bruits, or masses Cardiac: RRR; no murmurs, rubs, or gallops,no edema  Respiratory:  clear to auscultation bilaterally, normal work of breathing GI: soft, nontender, nondistended, + BS overweight MS: no deformity or atrophy  Skin: warm and dry, no rash Neuro:  Alert and Oriented x 3, Strength and  sensation are intact Psych: euthymic mood, full affect  Wt Readings from Last 3 Encounters:  04/10/16 208 lb 1.9 oz (94.4 kg)  03/12/16 203 lb (92.1 kg)  12/19/15 192 lb 6.4 oz (87.3 kg)      Studies/Labs Reviewed:   EKG:  EKG is not ordered today.    Recent Labs: 11/18/2015: TSH 0.735 11/20/2015: ALT 57; B Natriuretic Peptide 344.5 11/25/2015: Magnesium 1.8 11/27/2015: BUN 33; Creatinine, Ser 1.50; Hemoglobin 10.1; Platelets 318; Potassium 4.3; Sodium 134   Lipid Panel No results found for: CHOL, TRIG, HDL, CHOLHDL, VLDL, LDLCALC, LDLDIRECT  Additional studies/ records that were reviewed today include:  Prior office notes, laboratory, testing reviewed    ASSESSMENT:    1. PAOD (peripheral arterial occlusive disease) (HCC)   2. Paroxysmal atrial fibrillation ([place])   3. Diabetes mellitus with peripheral vascular disease (HCC)      PLAN:  In order of problems listed above:  Atrial fibrillation  - Bleeding complication from Xarelto 15 mg into urostomy bag.  - Dr. Tresa Moore has evaluated. Reassurance.  - Starting Eliquis 5 mg twice a day. Gave her 30 day free card. If she bleeds once again, I would encourage Coumadin use. She understands.  Chronic anticoagulation  - Has been off of Xarelto for 2 months. Could not take because of bleeding.  Coronary artery disease  - Obtuse marginal stent following myocardial infarction 2014  History bladder cancer  - Dr. Tresa Moore is now watching.  Shortness of breath  - Likely multifactorial from deconditioning as well. Prior stress test as above. Normal EF.     Medication Adjustments/Labs and Tests Ordered: Current medicines are reviewed at length with the patient today.  Concerns regarding medicines are outlined above.  Medication changes, Labs and Tests ordered today are listed in the Patient Instructions below. Patient Instructions  Medication Instructions:  Please stop your ASA. Start Eliquis 5 mg one tablet twice a  day. Continue  all other medications as listed.  Follow-Up: Follow up in 2 months Cecilie Kicks.  If you need a refill on your cardiac medications before your next appointment, please call your pharmacy.  Thank you for choosing [facility]!!        Signed, Candee Furbish, MD  04/10/2016 2:25 PM    [address], [place], [place]  [address] Phone: [phone]; Fax: [phone]

## 2016-04-10 NOTE — Patient Instructions (Signed)
Medication Instructions:  Please stop your ASA. Start Eliquis 5 mg one tablet twice a day. Continue all other medications as listed.  Follow-Up: Follow up in 2 months Cecilie Kicks.  If you need a refill on your cardiac medications before your next appointment, please call your pharmacy.  Thank you for choosing [facility]!!

## 2016-04-27 DIAGNOSIS — Z936 Other artificial openings of urinary tract status: Secondary | ICD-10-CM | POA: Diagnosis not present

## 2016-04-27 DIAGNOSIS — Z436 Encounter for attention to other artificial openings of urinary tract: Secondary | ICD-10-CM | POA: Diagnosis not present

## 2016-04-30 DIAGNOSIS — H353213 Exudative age-related macular degeneration, right eye, with inactive scar: Secondary | ICD-10-CM | POA: Diagnosis not present

## 2016-04-30 DIAGNOSIS — H353124 Nonexudative age-related macular degeneration, left eye, advanced atrophic with subfoveal involvement: Secondary | ICD-10-CM | POA: Diagnosis not present

## 2016-04-30 DIAGNOSIS — H43823 Vitreomacular adhesion, bilateral: Secondary | ICD-10-CM | POA: Diagnosis not present

## 2016-04-30 DIAGNOSIS — E103393 Type 1 diabetes mellitus with moderate nonproliferative diabetic retinopathy without macular edema, bilateral: Secondary | ICD-10-CM | POA: Diagnosis not present

## 2016-05-17 ENCOUNTER — Other Ambulatory Visit: Payer: Self-pay | Admitting: Cardiology

## 2016-05-29 DIAGNOSIS — R809 Proteinuria, unspecified: Secondary | ICD-10-CM | POA: Diagnosis not present

## 2016-05-29 DIAGNOSIS — N2581 Secondary hyperparathyroidism of renal origin: Secondary | ICD-10-CM | POA: Diagnosis not present

## 2016-05-29 DIAGNOSIS — I509 Heart failure, unspecified: Secondary | ICD-10-CM | POA: Diagnosis not present

## 2016-05-29 DIAGNOSIS — N183 Chronic kidney disease, stage 3 (moderate): Secondary | ICD-10-CM | POA: Diagnosis not present

## 2016-05-29 DIAGNOSIS — D631 Anemia in chronic kidney disease: Secondary | ICD-10-CM | POA: Diagnosis not present

## 2016-05-29 DIAGNOSIS — I129 Hypertensive chronic kidney disease with stage 1 through stage 4 chronic kidney disease, or unspecified chronic kidney disease: Secondary | ICD-10-CM | POA: Diagnosis not present

## 2016-05-29 DIAGNOSIS — N2589 Other disorders resulting from impaired renal tubular function: Secondary | ICD-10-CM | POA: Diagnosis not present

## 2016-05-29 DIAGNOSIS — Z23 Encounter for immunization: Secondary | ICD-10-CM | POA: Diagnosis not present

## 2016-05-29 DIAGNOSIS — C679 Malignant neoplasm of bladder, unspecified: Secondary | ICD-10-CM | POA: Diagnosis not present

## 2016-05-29 DIAGNOSIS — I4891 Unspecified atrial fibrillation: Secondary | ICD-10-CM | POA: Diagnosis not present

## 2016-05-29 DIAGNOSIS — R319 Hematuria, unspecified: Secondary | ICD-10-CM | POA: Diagnosis not present

## 2016-05-29 DIAGNOSIS — E118 Type 2 diabetes mellitus with unspecified complications: Secondary | ICD-10-CM | POA: Diagnosis not present

## 2016-05-31 ENCOUNTER — Other Ambulatory Visit: Payer: Self-pay | Admitting: Cardiology

## 2016-05-31 ENCOUNTER — Ambulatory Visit: Payer: PPO | Admitting: Endocrinology

## 2016-06-09 NOTE — Progress Notes (Deleted)
Cardiology Office Note   Date:  06/09/2016   ID:  [name], DOB 04-Aug-1941, MRN TO:[id number]  PCP:  Gerrit Heck, MD  Cardiologist:  Dr. Marlou Porch    No chief complaint on file.     History of Present Illness: [name] is a 75 y.o. female who presents for ***  She has a hx of  coronary artery disease status post myocardial infarction in January 2014 following bladder surgery at [facility], atrial fibrillation, uncontrolled diabetes, obtuse marginal stent at [facility], hypertension, hyperlipidemia, PVD, right BKA, chronic kidney disease stage 3-4, urology Dr. Tresa Moore.  Also with hospitalization  6/17/14with chest pain, radiation to left arm with shortness of breath. Troponin was normal, EKG showed subtle ST segment depression in inferior leads. Echocardiogram showed normal EF, nuclear stress test showed no definitive evidence of prior infarction or ischemia. Attenuation artifact noted. Large area of attenuation involving the lateral wall from prior infarct. Medical management. Hx of Rt BKA.  Now on eliquis for atrial fib due to stopping xarelto due to hematuria.    Today*** Past Medical History:  Diagnosis Date  . Anemia   . Blood transfusion   . CAD (coronary artery disease) 01/26/2013   Evidently circumflex stent in 2014 following bladder cancer surgery for old myocardial infarction.   . CKD (chronic kidney disease) stage 3, GFR 30-59 ml/min 01/26/2013  . GERD (gastroesophageal reflux disease)   . Glaucoma   . History of atrial tachycardia   . History of bladder cancer TCC AND CIS  . Hyperlipidemia   . Hypertension   . Macular degeneration of both eyes   . Myocardial infarction   . Obesity (BMI 30-39.9)   . Peripheral neuropathy (HCC) LEGS AND HANDS  . Peripheral vascular disease (HCC) S/P RIGHT BKA  . Presence of urostomy ([place])   . Retinopathy due to secondary diabetes mellitus ([place])   . 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 Type 2 diabetes  mellitus with vascular disease [facility])     Past Surgical History:  Procedure Laterality Date  . ABDOMINAL ANGIOGRAM N/A 09/08/2014   Procedure: ABDOMINAL ANGIOGRAM;  Surgeon: Conrad Puryear, MD;  Location: [facility] CATH LAB;  Service: Cardiovascular;  Laterality: N/A;  . ABDOMINAL HYSTERECTOMY    . AMPUTATION Left 09/14/2014   Procedure: LEFT FOOT FIRST RAY AMPUTATION;  Surgeon: Mcarthur Rossetti, MD;  Location: [place];  Service: Orthopedics;  Laterality: Left;  . APPENDECTOMY  1963  . [place]  . BELOW KNEE LEG AMPUTATION  05-02-2007   RIGHT  . CATARACT EXTRACTION W/ INTRAOCULAR LENS  IMPLANT, BILATERAL    . CORONARY STENT PLACEMENT    . CYSTO / RESECTION BLADDER BX'S  04-01-11  &  11-26-10  . CYSTOSCOPY WITH BIOPSY  10/14/2011   Procedure: CYSTOSCOPY WITH BIOPSY;  Surgeon: Claybon Jabs, MD;  Location: [facility];  Service: Urology;  Laterality: N/A;  BLADDER BIOPSY  . FEMORAL-POPLITEAL BYPASS GRAFT Left 09/12/2014   Procedure:  LEFT FEMORAL-POPLITEAL ARTERY BYPASS GRAFT USING NON REVERSE LEFT GREATER SAPHENOUS VEIN;  Surgeon: Rosetta Posner, MD;  Location: [place];  Service: Vascular;  Laterality: Left;  . I & D RIGHT BELOW KNEE AMPUTATION WOUND  06-10-2011  . LAPAROSCOPIC CHOLECYSTECTOMY  12-10-1999  . REVISION UROSTOMY CUTANEOUS    . REVISION UROSTOMY CUTANEOUS    . RIGHT FOOT I & D / REMOVAL NECROTIC BONE  JUN  &  AUG 2008  .  RIGHT GREAT TOE AMPUTATION  11-05-2006     Current Outpatient Prescriptions  Medication Sig Dispense Refill  . acetaminophen (TYLENOL) 500 MG tablet Take 1,000 mg by mouth every 6 (six) hours as needed for mild pain.     Marland Kitchen amiodarone (PACERONE) 200 MG tablet Take 1 tablet (200 mg total) by mouth daily. 30 tablet 6  . apixaban (ELIQUIS) 5 MG TABS tablet Take 1 tablet (5 mg total) by mouth 2 (two) times daily. 60 tablet 11  . atorvastatin (LIPITOR) 80 MG tablet TAKE 1 TABLET (80 MG TOTAL) BY MOUTH EVERY EVENING. 90 tablet 3  .  furosemide (LASIX) 40 MG tablet Take 80 mg by mouth 2 (two) times daily. Takes all doses as often as she can.     . insulin glargine (LANTUS) 100 UNIT/ML injection Inject 0.3 mLs (30 Units total) into the skin 2 (two) times daily. 55 units in the morning and 60 units at night 10 mL 11  . insulin lispro (HUMALOG) 100 UNIT/ML injection Inject 20-50 Units into the skin daily as needed for high blood sugar. PER SLIDING SCALE    . isosorbide mononitrate (IMDUR) 30 MG 24 hr tablet TAKE 1 TABLET (30 MG TOTAL) BY MOUTH DAILY. 30 tablet 7  . Magnesium 300 MG CAPS Take 300 mg by mouth daily.    . Melatonin 10 MG TABS Take 10 mg by mouth at bedtime.     . metoprolol succinate (TOPROL-XL) 100 MG 24 hr tablet TAKE 1 TABLET (100 MG TOTAL) BY MOUTH EVERY EVENING. TAKE WITH OR IMMEDIATELY FOLLOWING A MEAL. 90 tablet 2  . Multiple Vitamins-Minerals (PRESERVISION AREDS PO) Take 1 capsule by mouth 2 (two) times daily.    . nitroGLYCERIN (NITROSTAT) 0.6 MG SL tablet Take 0.6 mg by mouth every 5 (five) minutes x 3 doses as needed.    Marland Kitchen omeprazole (PRILOSEC) 20 MG capsule Take 20 mg by mouth every evening.     . polyethylene glycol (MIRALAX / GLYCOLAX) packet Take 17 g by mouth as needed.    . potassium chloride SA (K-DUR,KLOR-CON) 20 MEQ tablet Take 20 mEq by mouth daily.     . Psyllium (VEGETABLE LAXATIVE PO) Take 4 tablets by mouth at bedtime.    . senna (SENOKOT) 8.6 MG TABS tablet Take 1 tablet by mouth See admin instructions. Takes 1 tab daily and can take addt'l tab as needed for constipation    . sodium bicarbonate 650 MG tablet Take 1,300 mg by mouth 2 (two) times daily.     No current facility-administered medications for this visit.     Allergies:   Gabapentin; Lisinopril; Metformin and related; and Biaxin [clarithromycin]    Social History:  The patient  reports that she quit smoking about 40 years ago. Her smoking use included Cigarettes. She has a 34.00 pack-year smoking history. She has never used  smokeless tobacco. She reports that she drinks alcohol. She reports that she does not use drugs.   Family History:  The patient's ***family history includes AAA (abdominal aortic aneurysm) in her brother; Diabetes in her brother; Heart attack in her brother and father; Heart disease in her father; Heart failure in her mother; Hypertension in her father; Sudden death (age of onset: 3) in her father.    ROS:  General:no colds or fevers, no weight changes Skin:no rashes or ulcers HEENT:no blurred vision, no congestion CV:see HPI PUL:see HPI GI:no diarrhea constipation or melena, no indigestion GU:no hematuria, no dysuria MS:no joint pain, no claudication Neuro:no  syncope, no lightheadedness Endo:no diabetes, no thyroid disease Wt Readings from Last 3 Encounters:  04/10/16 208 lb 1.9 oz (94.4 kg)  03/12/16 203 lb (92.1 kg)  12/19/15 192 lb 6.4 oz (87.3 kg)     PHYSICAL EXAM: VS:  There were no vitals taken for this visit. , BMI There is no height or weight on file to calculate BMI. General:Pleasant affect, NAD Skin:Warm and dry, brisk capillary refill HEENT:normocephalic, sclera clear, mucus membranes moist Neck:supple, no JVD, no bruits  Heart:S1S2 RRR without murmur, gallup, rub or click Lungs:clear without rales, rhonchi, or wheezes [id number], non tender, + BS, do not palpate liver spleen or masses Ext:no lower ext edema, 2+ pedal pulses, 2+ radial pulses Neuro:alert and oriented, MAE, follows commands, + facial symmetry    EKG:  EKG is ordered today. The ekg ordered today demonstrates ***   Recent Labs: 11/18/2015: TSH 0.735 11/20/2015: ALT 57; B Natriuretic Peptide 344.5 11/25/2015: Magnesium 1.8 11/27/2015: BUN 33; Creatinine, Ser 1.50; Hemoglobin 10.1; Platelets 318; Potassium 4.3; Sodium 134    Lipid Panel No results found for: CHOL, TRIG, HDL, CHOLHDL, VLDL, LDLCALC, LDLDIRECT     Other studies Reviewed: Additional studies/ records that were reviewed today include:  ***.   ASSESSMENT AND PLAN:  1.  ***   Current medicines are reviewed with the patient today.  The patient Has no concerns regarding medicines.  The following changes have been made:  See above Labs/ tests ordered today include:see above  Disposition:   FU:  see above  Signed, Cecilie Kicks, NP  06/09/2016 9:39 PM    [facility] [place], [place], [place], [place] Phone: [phone]; Fax: [phone]

## 2016-06-10 ENCOUNTER — Ambulatory Visit: Payer: PPO | Admitting: Cardiology

## 2016-06-14 ENCOUNTER — Encounter: Payer: Self-pay | Admitting: Cardiology

## 2016-06-27 ENCOUNTER — Other Ambulatory Visit: Payer: Self-pay | Admitting: *Deleted

## 2016-06-27 DIAGNOSIS — I6523 Occlusion and stenosis of bilateral carotid arteries: Secondary | ICD-10-CM

## 2016-06-27 DIAGNOSIS — I739 Peripheral vascular disease, unspecified: Secondary | ICD-10-CM

## 2016-07-11 DIAGNOSIS — Z89511 Acquired absence of right leg below knee: Secondary | ICD-10-CM | POA: Diagnosis not present

## 2016-07-15 IMAGING — CR DG CHEST 2V
2 series · 2 of 2 positions shown · non-contrast
Comparison: PA and lateral chest 10/05/2015 and 11/14/2012.

CLINICAL DATA: Chest pain and shortness of breath for 2 days.
Initial encounter.

EXAM:
CHEST  2 VIEW

[chest lat]
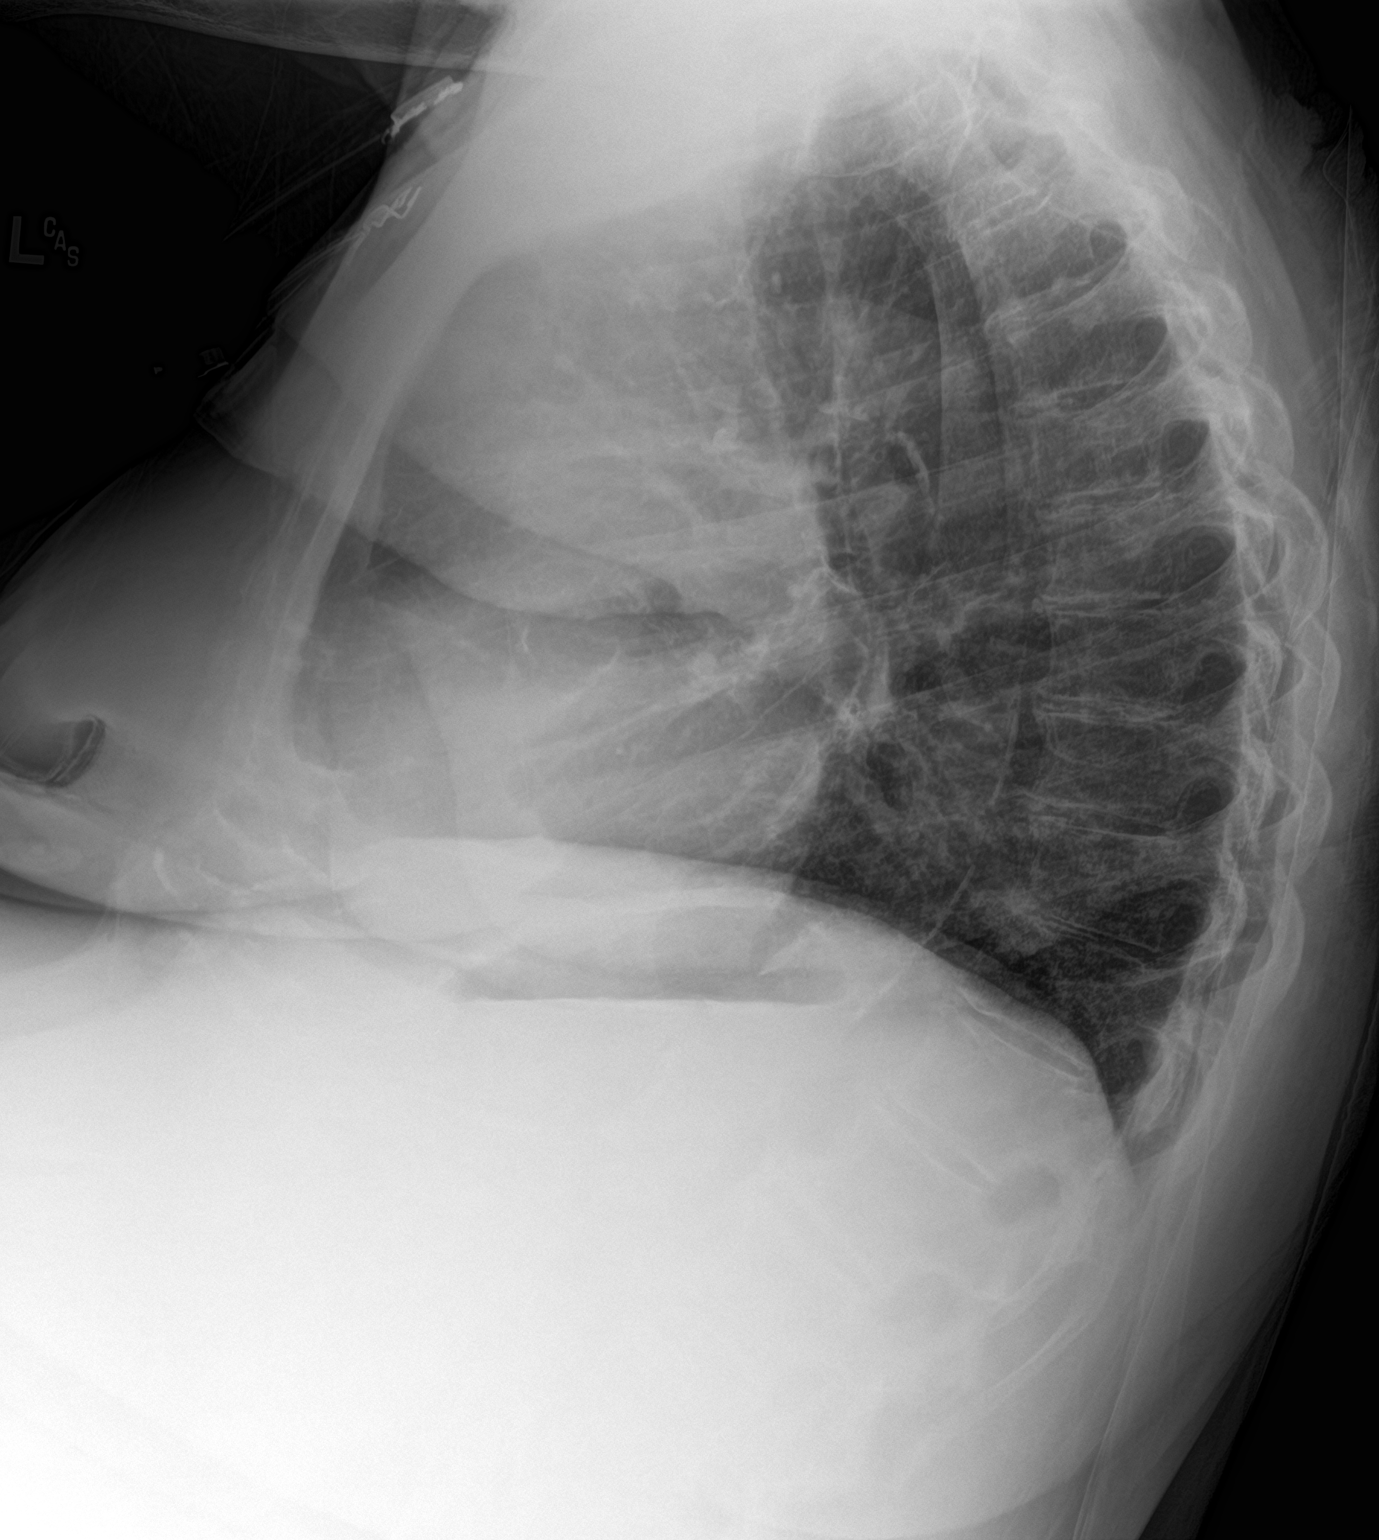

[chest ap]
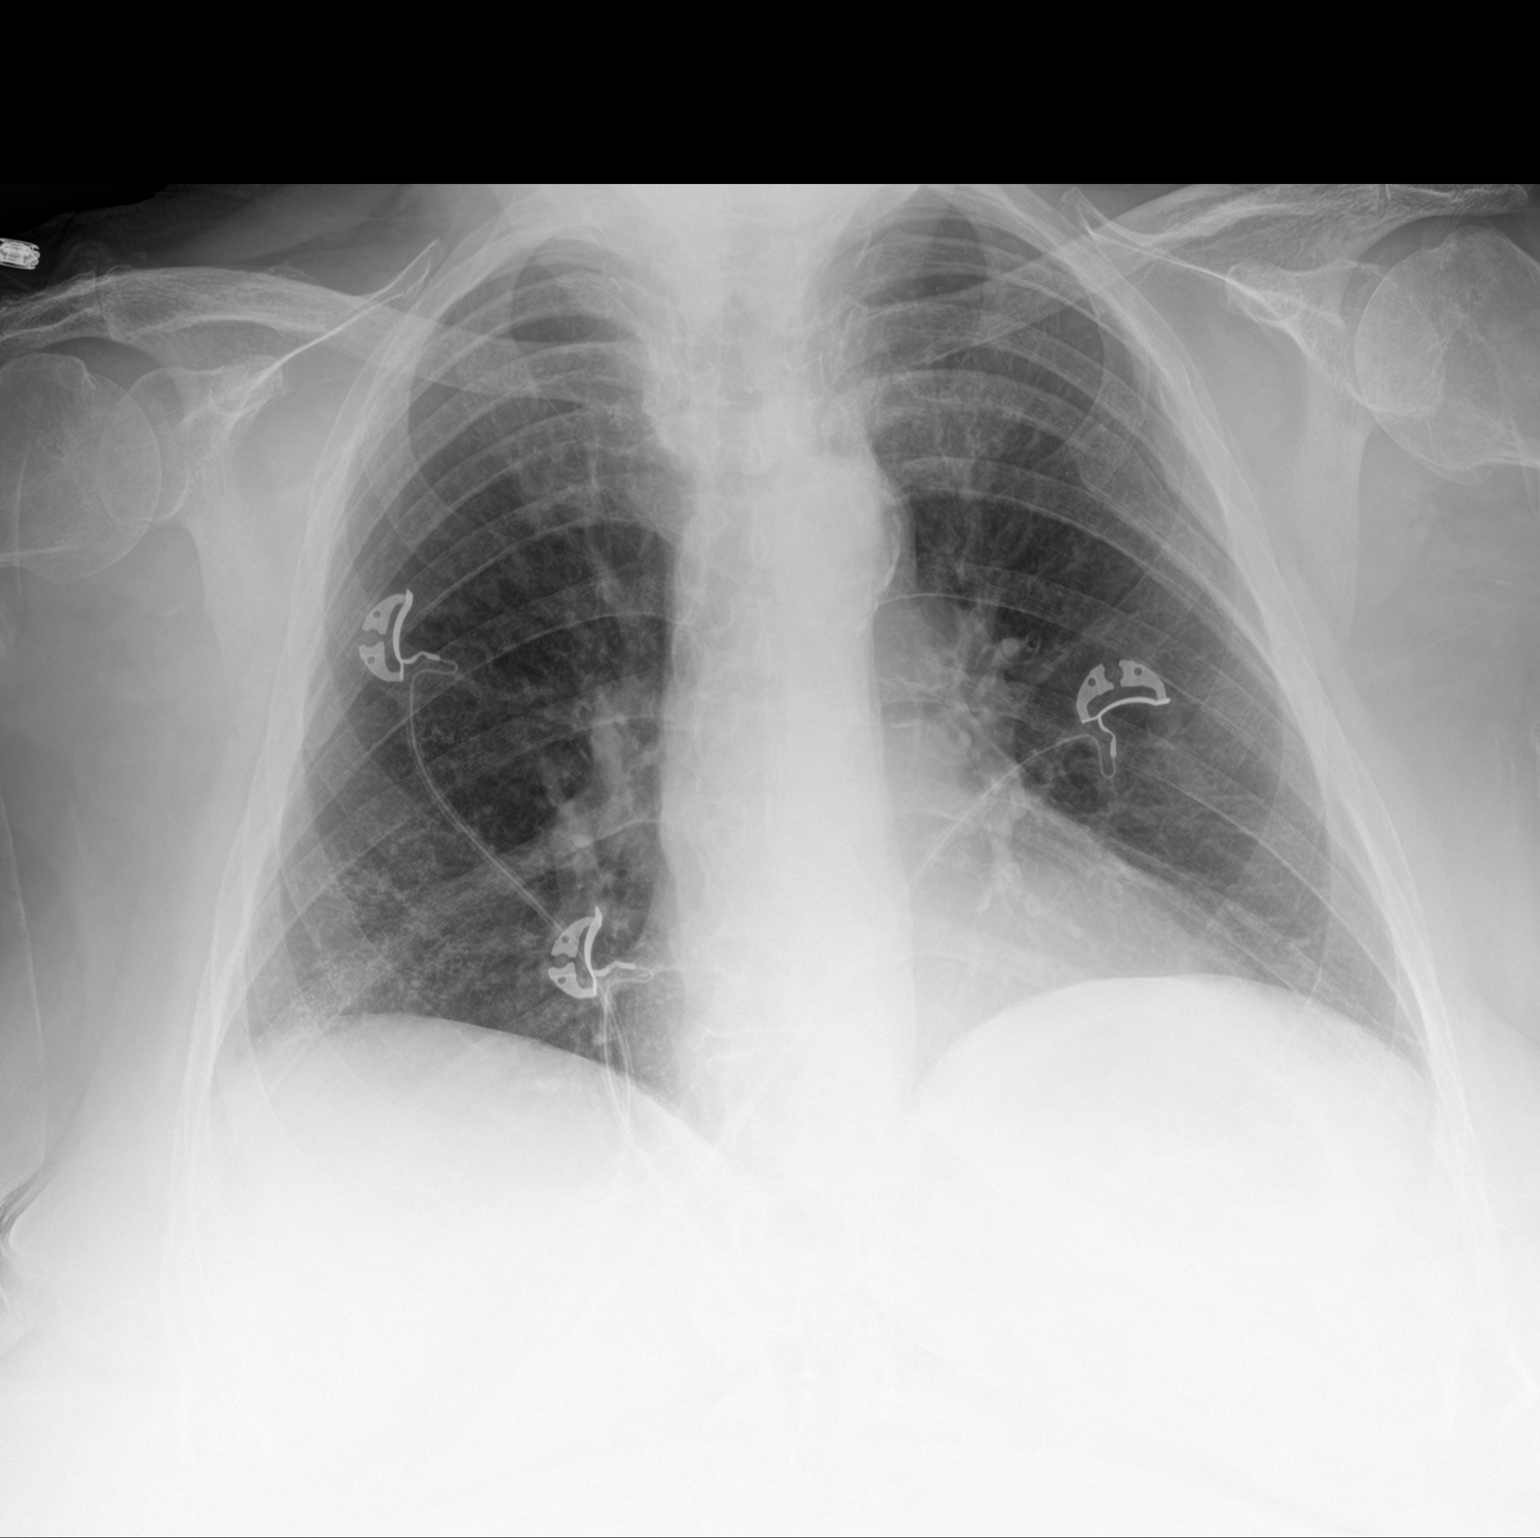

[2 of 2 positions shown; findings below may reference images not displayed]

FINDINGS: The lungs are clear. Heart size is normal. No pneumothorax or
pleural effusion. Remote left rib fractures are noted.
IMPRESSION: No acute disease.

## 2016-07-16 IMAGING — MR MR CERVICAL SPINE W/O CM
1 series · 13 of 13 positions shown · non-contrast
Comparison: 05/24/2010

CLINICAL DATA: Severe neck pain, acute on chronic.

EXAM:
MRI CERVICAL SPINE WITHOUT CONTRAST
TECHNIQUE: Multiplanar, multisequence MR imaging of the cervical spine was
performed. No intravenous contrast was administered.

[Series 3: T2 · sagittal · 3.0mm · 0.43mm/px · 13 of 13 slices shown]
[im 1/13]
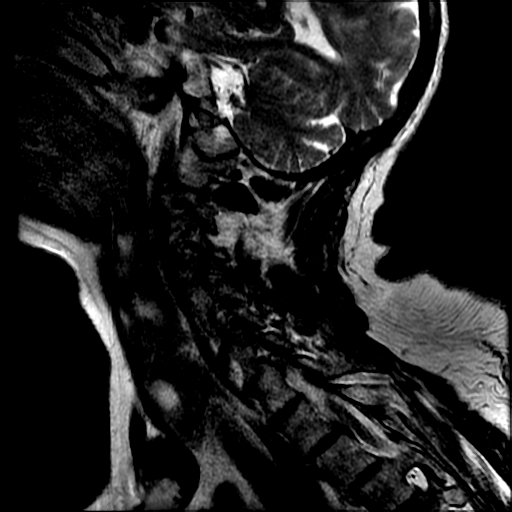
[im 2/13]
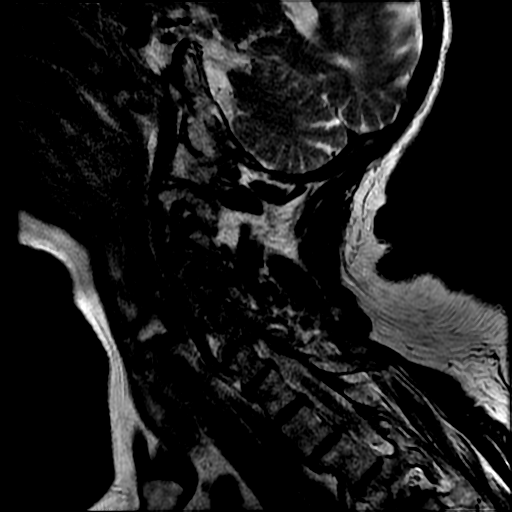
[im 3/13]
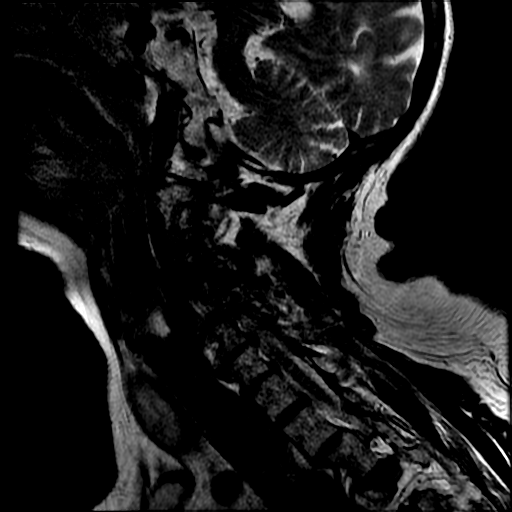
[im 4/13]
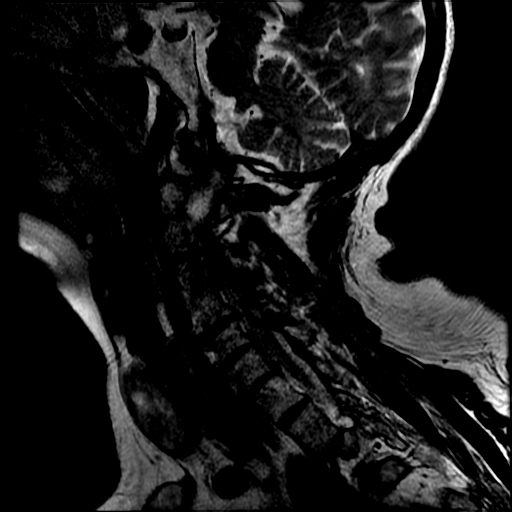
[im 5/13]
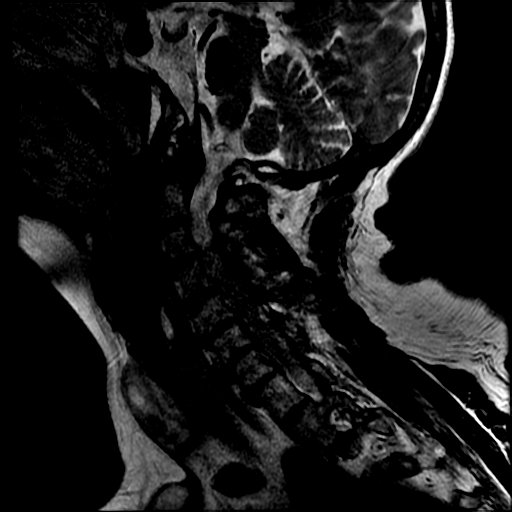
[im 6/13]
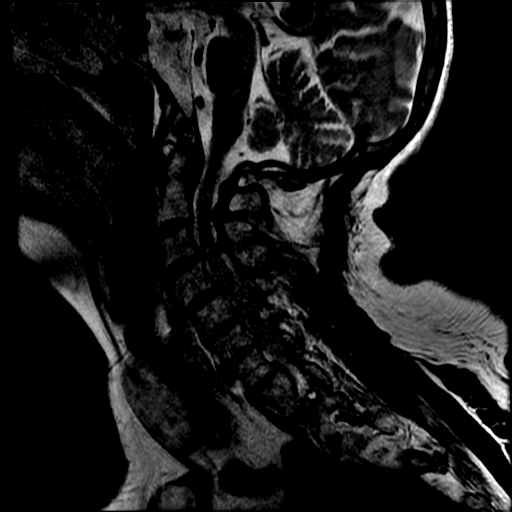
[im 7/13]
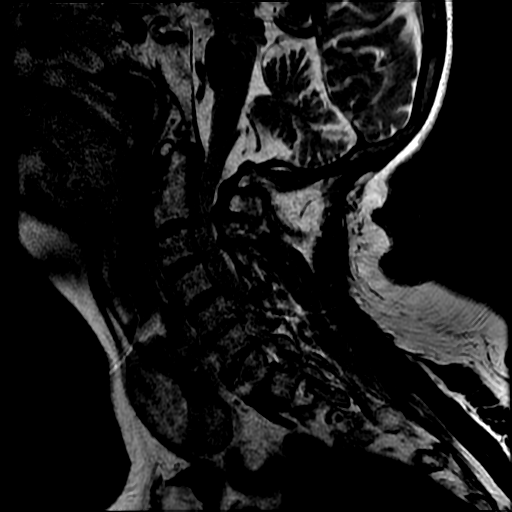
[im 8/13]
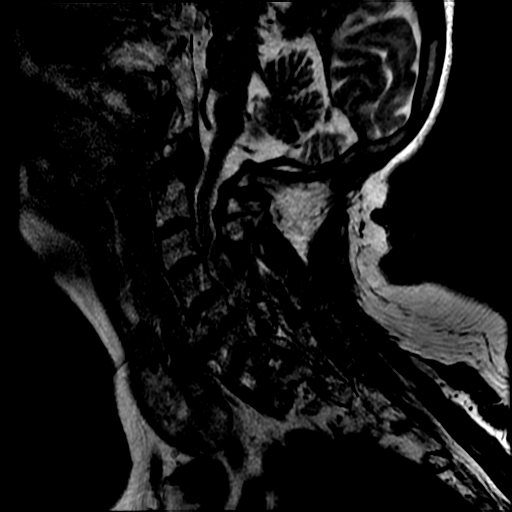
[im 9/13]
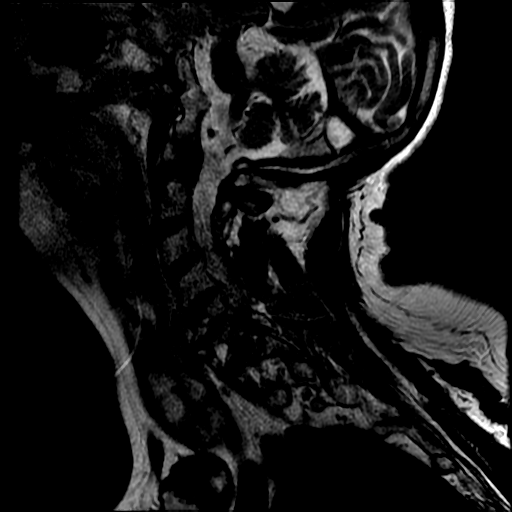
[im 10/13]
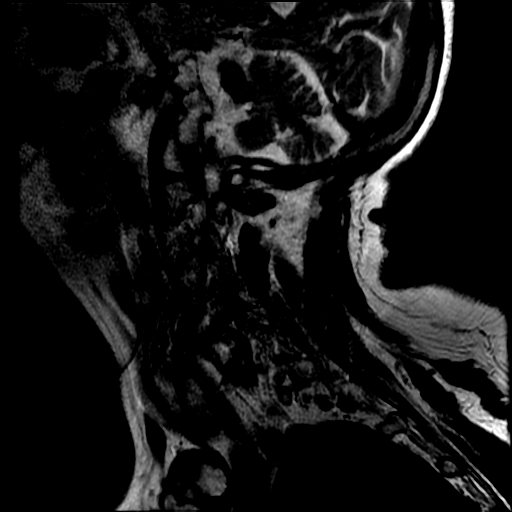
[im 11/13]
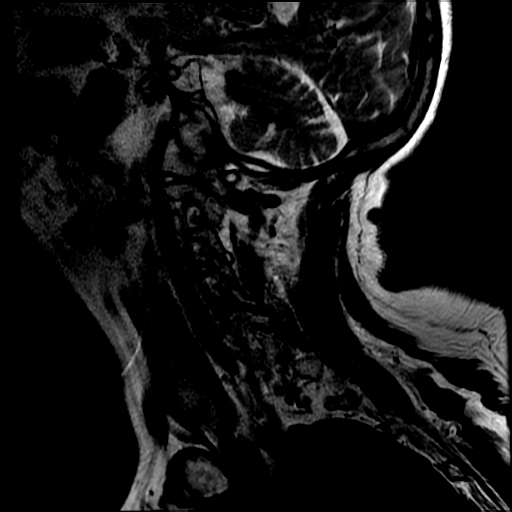
[im 12/13]
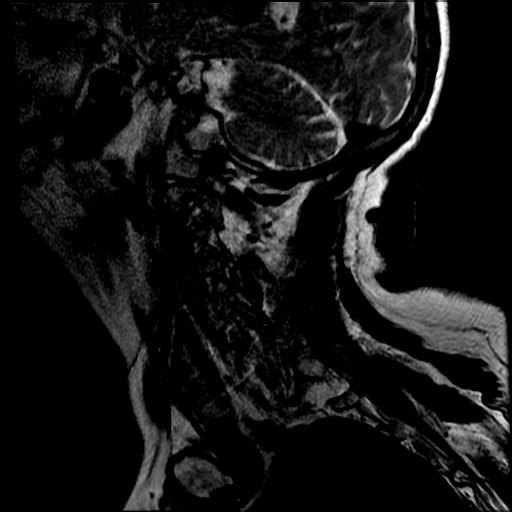
[im 13/13]
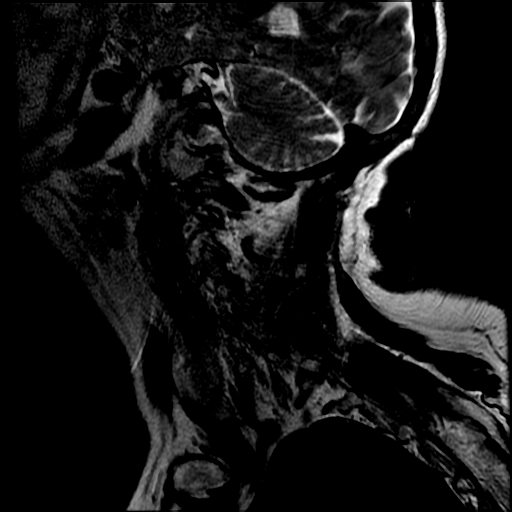

[13 of 13 positions shown; findings below may reference images not displayed]

FINDINGS: The study is motion degraded. Axial sequences are severely motion
degraded and largely nondiagnostic with regards to assessment of the
spinal canal and neural foramina.

Vertebral alignment is unchanged. Vertebral body heights are
preserved. No gross vertebral marrow edema is identified. No signal
abnormality is identified in the upper cervical cord. Evaluation of
the mid to lower cervical cord is limited by motion artifact.
Multiple thyroid nodules are partially visualized including up in
approximately 4 cm nodule involving the medial right lobe/isthmus as
described on the chest CT earlier today.

There is mild disc space narrowing at C5-6. There is moderate
narrowing at C6-7 which has progressed from the prior study.
Moderate C7-T1 disc space narrowing also may have mildly progressed.
Based on the sagittal images, there is suspected moderate spinal
stenosis at C2-3, C3-4, C5-6, and C6-7 due to disc bulging, endplate
spurring, and infolding of the ligamentum flavum. Multilevel facet
arthrosis is present. Multilevel neural foraminal narrowing cannot
be adequately assessed.
IMPRESSION: Severely motion degraded examination as above. Mildly progressive
cervical disc degeneration from 4077 with moderate multilevel spinal
stenosis.

## 2016-08-07 ENCOUNTER — Other Ambulatory Visit: Payer: Self-pay | Admitting: *Deleted

## 2016-08-07 NOTE — Patient Outreach (Signed)
HTA Screening call. Pt agrees to care management services. We have set up an appt for Friday, January 5th at 2:30 pm. Pt reports her diabetes management isn't so good because she often cannot afford her insulin. She reports she is in the donut hole.  I will ask for a pharmacy staff member to assist me with her case.  [name] [facility] [address] [phone]

## 2016-08-09 ENCOUNTER — Other Ambulatory Visit: Payer: Self-pay | Admitting: Cardiology

## 2016-08-16 ENCOUNTER — Encounter: Payer: Self-pay | Admitting: *Deleted

## 2016-08-16 ENCOUNTER — Other Ambulatory Visit: Payer: Self-pay | Admitting: *Deleted

## 2016-08-16 NOTE — Patient Outreach (Addendum)
[facility]) Care Management   08/17/2016  ANTARA GHUMAN 1941-06-18 TO:[id number]  [name] is an 76 y.o. female  Subjective: New High Risk referral from Dr. Drema Dallas. Pt has long standing hx of diabetes, hx of RBKA, current chronic wound L heel which pt and daughter tend to. Pt is mostly wheelchair bound but can stand and ambulate (has R below knee prosthetic). Pt resides with her daughter, Horris Latino, who is also a diabetic and an amputee.  Pt admits to infrequent wound care as it is difficult to put on and take off her support stocking.  Objective:   Review of Systems  HENT: Negative.   Eyes: Positive for blurred vision.  Respiratory: Negative.   Cardiovascular: Positive for leg swelling.  Gastrointestinal: Positive for constipation.  Genitourinary:       Urostomy in place.  Musculoskeletal: Positive for back pain and falls.  Skin: Negative.   Neurological: Positive for sensory change and weakness.       Pt has no sensation in her remaining L leg.  Psychiatric/Behavioral: Positive for depression.   SKIN: CHRONIC L HEEL WOUND.  BP (!) 160/80 (BP Location: Right Arm, Patient Position: Sitting, Cuff Size: Normal)   Pulse 69   Resp 16   Ht 1.626 m (5\' 4" )   Wt 210 lb (95.3 kg)   SpO2 97%   BMI 36.05 kg/m   Physical Exam  Constitutional: She is oriented to person, place, and time. She appears well-developed.  HENT:  Head: Normocephalic and atraumatic.  Neck: Normal range of motion.  Cardiovascular: Normal rate, regular rhythm and normal heart sounds.   Respiratory: Effort normal and breath sounds normal.  GI: Soft. Bowel sounds are normal.  Musculoskeletal: She exhibits edema.  4+  Neurological: She is alert and oriented to person, place, and time.  Psychiatric: She has a normal mood and affect.   SKIN: DECUBITUS L HEEL IS EXTENDING AND NEEDS DEBRIDEMENT ASAP! APPROXIMATE SIZE IS 4 X 4 CMS AND 1 CM IN DEPTH.  Encounter Medications:   Outpatient  Encounter Prescriptions as of 08/16/2016  Medication Sig  . acetaminophen (TYLENOL) 500 MG tablet Take 1,000 mg by mouth every 6 (six) hours as needed for mild pain.   Marland Kitchen amiodarone (PACERONE) 200 MG tablet Take 1 tablet (200 mg total) by mouth daily.  Marland Kitchen apixaban (ELIQUIS) 5 MG TABS tablet Take 1 tablet (5 mg total) by mouth 2 (two) times daily.  Marland Kitchen atorvastatin (LIPITOR) 80 MG tablet TAKE 1 TABLET (80 MG TOTAL) BY MOUTH EVERY EVENING.  . furosemide (LASIX) 40 MG tablet Take 80 mg by mouth 2 (two) times daily. Takes all doses as often as she can.   . insulin glargine (LANTUS) 100 UNIT/ML injection Inject 0.3 mLs (30 Units total) into the skin 2 (two) times daily. 55 units in the morning and 60 units at night  . insulin lispro (HUMALOG) 100 UNIT/ML injection Inject 20-50 Units into the skin daily as needed for high blood sugar. PER SLIDING SCALE  . isosorbide mononitrate (IMDUR) 30 MG 24 hr tablet Take 1 tablet (30 mg total) by mouth daily. Please call and schedule follow up appt for further refills [phone]  . Magnesium 300 MG CAPS Take 300 mg by mouth daily.  . Melatonin 10 MG TABS Take 10 mg by mouth at bedtime.   . metoprolol succinate (TOPROL-XL) 100 MG 24 hr tablet TAKE 1 TABLET (100 MG TOTAL) BY MOUTH EVERY EVENING. TAKE WITH OR IMMEDIATELY FOLLOWING A MEAL.  Marland Kitchen  Multiple Vitamins-Minerals (PRESERVISION AREDS PO) Take 1 capsule by mouth 2 (two) times daily.  Marland Kitchen omeprazole (PRILOSEC) 20 MG capsule Take 20 mg by mouth every evening.   . polyethylene glycol (MIRALAX / GLYCOLAX) packet Take 17 g by mouth as needed.  . potassium chloride SA (K-DUR,KLOR-CON) 20 MEQ tablet Take 20 mEq by mouth daily.   . Psyllium (VEGETABLE LAXATIVE PO) Take 4 tablets by mouth at bedtime.  . senna (SENOKOT) 8.6 MG TABS tablet Take 1 tablet by mouth See admin instructions. Takes 1 tab daily and can take addt'l tab as needed for constipation  . sodium bicarbonate 650 MG tablet Take 1,300 mg by mouth 2 (two) times daily.   . nitroGLYCERIN (NITROSTAT) 0.6 MG SL tablet Take 0.6 mg by mouth every 5 (five) minutes x 3 doses as needed.   No facility-administered encounter medications on file as of 08/16/2016.     Functional Status:   In your present state of health, do you have any difficulty performing the following activities: 08/16/2016 11/24/2015  Hearing? N N  Vision? Y N  Difficulty concentrating or making decisions? N N  Walking or climbing stairs? Y Y  Dressing or bathing? Y Y  Doing errands, shopping? Y -  Conservation officer, nature and eating ? N -  Using the Toilet? N -  In the past six months, have you accidently leaked urine? N -  Do you have problems with loss of bowel control? Y -  Managing your Medications? N -  Managing your Finances? N -  Housekeeping or managing your Housekeeping? Y -  Some recent data might be hidden    Fall/Depression Screening:    PHQ 2/9 Scores 08/16/2016  PHQ - 2 Score 4  PHQ- 9 Score 8   Fall Risk  08/16/2016  Falls in the past year? Yes  Number falls in past yr: 1  Injury with Fall? No  Risk for fall due to : History of fall(s);Impaired balance/gait  Risk for fall due to (comments): RBKA.  Follow up Falls evaluation completed;Education provided;Falls prevention discussed    Assessment:  POORLY CONTROLLED DIABETES SELF MANAGEMENT                         SEVERE DIABETIC NEUROPATHY                          DECUBITUS ULCER L HEEL WITH NEED FOR DEBRIDEMENT                          NO ADVANCED DIRECTIVES                          NO RELIABLE TRANSPORTATION                          NEWLY DIAGNOSED DEPRESSION  [facility] CM Care Plan Problem One   Flowsheet Row Most Recent Value  Care Plan Problem One  Worsening decubitus ulcer on L heel  Role Documenting the Problem One  Care Management Coordinator  Care Plan for Problem One  Active  [facility] Long Term Goal (31-90 days)  Pt will attend wound clinic appts over the next 90 days.  [facility] Long Term Goal Start Date  08/16/16  Interventions for  Problem One Long Term Goal  Pt to call to get appt on 08/19/16. She is  well known there. I have advised her pts' ususally need a referral from their primary care MD.  [facility] CM Short Term Goal #1 (0-30 days)  Pt to arrange appt at the wound clinic in the next 7 days.  [facility] CM Short Term Goal #1 Start Date  08/16/16  Interventions for Short Term Goal #1  Assessed pt chronic wound: wound is extending and needs debridement ASAP.  [facility] CM Short Term Goal #2 (0-30 days)  Pt to purchase new support stockings during the next 30 days.  [facility] CM Short Term Goal #2 Start Date  08/16/16  Interventions for Short Term Goal #2  Provided information on [place] measured pt leg, wrote down what pt needs to order.  [facility] CM Short Term Goal #3 (0-30 days)  Pt to elevate her leg as much as possible over the next 30 days.  [facility] CM Short Term Goal #3 Start Date  08/16/16  Interventions for Short Tern Goal #3  Advised pt she must elevate her leg to improve circulation and reduce edema and to relieve pressure.    [facility] CM Care Plan Problem Two   Flowsheet Row Most Recent Value  Care Plan Problem Two  Needs transportation.  Role Documenting the Problem Two  Care Management Coordinator  Care Plan for Problem Two  Active  [facility] CM Short Term Goal #1 (0-30 days)  Pt to arrange transportation with [employer] for MD appts and wound clinic appts.  [facility] CM Short Term Goal #1 Start Date  08/16/16  Interventions for Short Term Goal #2   Provided information on transportation service.    [facility] CM Care Plan Problem Three   Flowsheet Row Most Recent Value  Care Plan Problem Three  No Advanced Directives  Role Documenting the Problem Three  Care Management Coordinator  Care Plan for Problem Three  Active  [facility] CM Short Term Goal #1 (0-30 days)  Pt will complete her Living Will, HCPOA and MOST forms over the next 30 days.  [facility] CM Short Term Goal #1 Start Date  08/16/16  Interventions for Short Term Goal #1  Provided documents and  discussed the importance of these. Pt does NOT want CPR. Reviewed MD chart, I did not see any ADs.     **Dr. Drema Dallas, I would like to initiate antidepressant therapy, do you have a preference or a suggestion for this patient?   [name] [facility] [phone]

## 2016-08-17 ENCOUNTER — Encounter: Payer: Self-pay | Admitting: *Deleted

## 2016-08-19 ENCOUNTER — Other Ambulatory Visit: Payer: Self-pay | Admitting: *Deleted

## 2016-08-20 NOTE — Patient Outreach (Signed)
Telephone follow up. Pt reports she cannot get an appt at [facility] for another 2 weeks. She then called [place] [facility] and they will be able to see her on Wednesday this week.  She will also see Dr. Dellia Nims who is familiar with her hx. Pt is to call me and report on the outcome of his assessment and plan of care.  [name] [facility] [place] [phone]

## 2016-08-21 ENCOUNTER — Encounter: Payer: PPO | Attending: Internal Medicine | Admitting: Internal Medicine

## 2016-08-21 ENCOUNTER — Ambulatory Visit
Admission: RE | Admit: 2016-08-21 | Discharge: 2016-08-21 | Disposition: A | Discharge status: Home / Self Care | Payer: PPO | Source: Ambulatory Visit | Attending: Internal Medicine | Admitting: Internal Medicine

## 2016-08-21 ENCOUNTER — Other Ambulatory Visit: Payer: Self-pay | Admitting: Internal Medicine

## 2016-08-21 DIAGNOSIS — E785 Hyperlipidemia, unspecified: Secondary | ICD-10-CM | POA: Diagnosis not present

## 2016-08-21 DIAGNOSIS — Z8551 Personal history of malignant neoplasm of bladder: Secondary | ICD-10-CM | POA: Diagnosis not present

## 2016-08-21 DIAGNOSIS — M1990 Unspecified osteoarthritis, unspecified site: Secondary | ICD-10-CM | POA: Diagnosis not present

## 2016-08-21 DIAGNOSIS — I252 Old myocardial infarction: Secondary | ICD-10-CM | POA: Diagnosis not present

## 2016-08-21 DIAGNOSIS — Z794 Long term (current) use of insulin: Secondary | ICD-10-CM | POA: Insufficient documentation

## 2016-08-21 DIAGNOSIS — Z87891 Personal history of nicotine dependence: Secondary | ICD-10-CM | POA: Diagnosis not present

## 2016-08-21 DIAGNOSIS — I2510 Atherosclerotic heart disease of native coronary artery without angina pectoris: Secondary | ICD-10-CM | POA: Insufficient documentation

## 2016-08-21 DIAGNOSIS — I4891 Unspecified atrial fibrillation: Secondary | ICD-10-CM | POA: Diagnosis not present

## 2016-08-21 DIAGNOSIS — L97422 Non-pressure chronic ulcer of left heel and midfoot with fat layer exposed: Secondary | ICD-10-CM | POA: Diagnosis not present

## 2016-08-21 DIAGNOSIS — L97423 Non-pressure chronic ulcer of left heel and midfoot with necrosis of muscle: Secondary | ICD-10-CM | POA: Insufficient documentation

## 2016-08-21 DIAGNOSIS — I129 Hypertensive chronic kidney disease with stage 1 through stage 4 chronic kidney disease, or unspecified chronic kidney disease: Secondary | ICD-10-CM | POA: Diagnosis not present

## 2016-08-21 DIAGNOSIS — H3530 Unspecified macular degeneration: Secondary | ICD-10-CM | POA: Diagnosis not present

## 2016-08-21 DIAGNOSIS — S91302A Unspecified open wound, left foot, initial encounter: Secondary | ICD-10-CM | POA: Insufficient documentation

## 2016-08-21 DIAGNOSIS — L03116 Cellulitis of left lower limb: Secondary | ICD-10-CM | POA: Insufficient documentation

## 2016-08-21 DIAGNOSIS — L97221 Non-pressure chronic ulcer of left calf limited to breakdown of skin: Secondary | ICD-10-CM | POA: Diagnosis not present

## 2016-08-21 DIAGNOSIS — Z7901 Long term (current) use of anticoagulants: Secondary | ICD-10-CM | POA: Insufficient documentation

## 2016-08-21 DIAGNOSIS — I87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: Secondary | ICD-10-CM | POA: Insufficient documentation

## 2016-08-21 DIAGNOSIS — E11621 Type 2 diabetes mellitus with foot ulcer: Secondary | ICD-10-CM | POA: Insufficient documentation

## 2016-08-21 DIAGNOSIS — E1140 Type 2 diabetes mellitus with diabetic neuropathy, unspecified: Secondary | ICD-10-CM | POA: Diagnosis not present

## 2016-08-21 DIAGNOSIS — X58XXXA Exposure to other specified factors, initial encounter: Secondary | ICD-10-CM | POA: Diagnosis not present

## 2016-08-21 DIAGNOSIS — D649 Anemia, unspecified: Secondary | ICD-10-CM | POA: Insufficient documentation

## 2016-08-21 DIAGNOSIS — N183 Chronic kidney disease, stage 3 (moderate): Secondary | ICD-10-CM | POA: Insufficient documentation

## 2016-08-21 DIAGNOSIS — E1151 Type 2 diabetes mellitus with diabetic peripheral angiopathy without gangrene: Secondary | ICD-10-CM | POA: Diagnosis not present

## 2016-08-21 DIAGNOSIS — Z936 Other artificial openings of urinary tract status: Secondary | ICD-10-CM | POA: Insufficient documentation

## 2016-08-21 DIAGNOSIS — E1122 Type 2 diabetes mellitus with diabetic chronic kidney disease: Secondary | ICD-10-CM | POA: Insufficient documentation

## 2016-08-21 DIAGNOSIS — Z89511 Acquired absence of right leg below knee: Secondary | ICD-10-CM | POA: Diagnosis not present

## 2016-08-21 DIAGNOSIS — L97821 Non-pressure chronic ulcer of other part of left lower leg limited to breakdown of skin: Secondary | ICD-10-CM | POA: Diagnosis not present

## 2016-08-21 DIAGNOSIS — Z992 Dependence on renal dialysis: Secondary | ICD-10-CM | POA: Diagnosis not present

## 2016-08-22 ENCOUNTER — Encounter: Payer: Self-pay | Admitting: Vascular Surgery

## 2016-08-22 NOTE — Progress Notes (Signed)
HATICE, DALOIA ([id number]) Visit Report for 08/21/2016 Chief Complaint Document Details Patient Name: [name], [name] Date of Service: 08/21/2016 8:00 AM Medical Record Patient Account Number: [id number] Number: Treating RN: Montey Hora 03-03-1941 (76 y.o. Other Clinician: Date of Birth/Sex: Female) Treating ROBSON, MICHAEL Primary Care Physician/Extender: Ronne Binning Physician: Referring Physician: Suella Grove in Treatment: 0 Information Obtained from: Patient Chief Complaint Patient is here for review of a wound on the left heel Electronic Signature(s) Signed: 08/21/2016 6:05:53 PM By: Linton Ham MD Entered By: Linton Ham on 08/21/2016 09:55:35 [place], [name] ([id number]) -------------------------------------------------------------------------------- Debridement Details Patient Name: [name] Date of Service: 08/21/2016 8:00 AM Medical Record Patient Account Number: [id number] Number: Treating RN: Montey Hora Dec 06, 1940 (76 y.o. Other Clinician: Date of Birth/Sex: Female) Treating ROBSON, MICHAEL Primary Care Physician/Extender: Ronne Binning Physician: Referring Physician: Weeks in Treatment: 0 Debridement Performed for Wound #2 Left,Lateral Calcaneus Assessment: Performed By: Physician Ricard Dillon, MD Debridement: Debridement Pre-procedure Yes - 08:56 Verification/Time Out Taken: Start Time: 08:56 Pain Control: Lidocaine 4% Topical Solution Level: Skin/Subcutaneous Tissue Total Area Debrided (L x 3.3 (cm) x 2.8 (cm) = 9.24 (cm) W): Tissue and other Viable, Non-Viable, Eschar, Fibrin/Slough, Subcutaneous material debrided: Instrument: Curette Bleeding: Minimum Hemostasis Achieved: Pressure End Time: 09:00 Procedural Pain: 0 Post Procedural Pain: 0 Response to Treatment: Procedure was tolerated well Post Debridement Measurements of Total Wound Length: (cm) 3.3 Width: (cm) 2.8 Depth: (cm)  0.3 Volume: (cm) 2.177 Character of Wound/Ulcer Post Improved Debridement: Severity of Tissue Post Debridement: Fat layer exposed Post Procedure Diagnosis Same as Pre-procedure Electronic Signature(s) Signed: 08/21/2016 5:40:28 PM By: Marny Lowenstein ([id number]) Signed: 08/21/2016 6:05:53 PM By: Linton Ham MD Entered By: Linton Ham on 08/21/2016 09:55:05 Fedder, [name] ([id number]) -------------------------------------------------------------------------------- HPI Details Patient Name: [name] Date of Service: 08/21/2016 8:00 AM Medical Record Patient Account Number: [id number] Number: Treating RN: Montey Hora 1941-07-27 (76 y.o. Other Clinician: Date of Birth/Sex: Female) Treating ROBSON, MICHAEL Primary Care Physician/Extender: Ronne Binning Physician: Referring Physician: Suella Grove in Treatment: 0 History of Present Illness HPI Description: 08/21/16; [name] is a 76 year old woman who I cared for in our [facility] clinic up until January 2016. At that point she had a wound in her left great toe. 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 He follows with vein and vascular and has a follow-up appointment in early February. She is status left femoral to anterior tibial bypass in February 2016. She is also noted to have venous stasis changes and a history of ulcers. 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 fib on Eliquis. She has bladder cancer and has a chronic urostomy. The patient tells me that 2 weeks ago she had a right prosthesis and a new left diabetic shoe. Roughly one week ago she noted a wound on her left lateral  heel. She zinc oxide and some silver alginate she had left over until she could be seen here. It was also noted that she has had blistering on her posterior left calf and an open area here as well. Her records note that she has chronic venous insufficiency with chronic inflammation especially in the  left mid calf area. The patient has not been systemically unwell no fever or chills. She is reasonably insensate and has not felt any pain Electronic Signature(s) Signed: 08/21/2016 6:05:53 PM By: Linton Ham MD Entered By: Linton Ham on 08/21/2016 10:00:56 [name] ([id number]) -------------------------------------------------------------------------------- Physical Exam Details Patient Name: [name] Date of Service: 08/21/2016 8:00 AM Medical Record Patient Account Number: [id number] Number: Treating RN: Montey Hora 1940-12-29 (76 y.o. Other Clinician: Date of Birth/Sex: Female) Treating ROBSON, MICHAEL Primary Care Physician/Extender: Ronne Binning Physician: Referring Physician: Weeks in Treatment: 0 Constitutional Patient is hypertensive.. Pulse regular and within target range for patient.Marland Kitchen Respirations regular, non-labored and within target range.. Temperature is normal and within the target range for the patient.. Patient's appearance is neat and clean. Appears in no acute distress. Well nourished and well developed.. Eyes Conjunctivae clear. No discharge.Marland Kitchen Respiratory Respiratory effort is easy and symmetric bilaterally. Rate is normal at rest and on room air.. Bilateral breath sounds are clear and equal in all lobes with no wheezes, rales or rhonchi.. Cardiovascular Heart rate is irregular compatible with known A. fib. Her femoral pulse Center popliteal pulse on the left are palpable however I do not feel anything in her foot. Pedal pulses absent on the left. Edema present in left lower extremity. However this is mild. She wears  compression stockings. She has what appears to be venous inflammation circumferentially in the left mid leg however posteriorly this is more red and angry looking and there is blistering. I cannot rule out cellulitis in this area. Gastrointestinal (GI) Abdomen is soft. As noted she has a urostomy. Lymphatic None palpable in the popliteal or inguinal areas on the left. Neurological Completely absent vibration sense she was insensate to my debridement of her heel. Psychiatric No evidence of depression, anxiety, or agitation. Calm, cooperative, and communicative. Appropriate interactions and affect.. Notes 08/21/16 exam; the patient has a fairly substantial wound on the lateral aspect of her left heel. This had thick adherent necrotic tissue which I debrided with a curet however even after substantial debridement I was unable to see much viable tissue here. I think this is somewhat unusual given the history. There was no evidence of infection around the wound no erythema. oOn the posterior aspect of her left calf there is blistering and more intense erythema than the circumferential erythema around her mid leg. There is some discomfort here. I don't believe she has any evidence of a DVT. Some of the skin is simply denuding off the surface. I think this is compatible with cellulitis although this could be secondary to inflammation, irritation from her stockings or more edema ODALIS, PRUESS ([id number]) Electronic Signature(s) Signed: 08/21/2016 6:05:53 PM By: Linton Ham MD Entered By: Linton Ham on 08/21/2016 10:04:55 [name] ([id number]) -------------------------------------------------------------------------------- Physician Orders Details Patient Name: [name] Date of Service: 08/21/2016 8:00 AM Medical Record Patient Account Number: [id number] Number: Treating RN: Montey Hora Dec 28, 1940 (76 y.o. Other Clinician: Date of Birth/Sex: Female)  Treating ROBSON, MICHAEL Primary Care Physician/Extender: Ronne Binning Physician: Referring Physician: Suella Grove in Treatment: 0 Verbal / Phone Orders: Yes Clinician: Montey Hora Read Back and Verified: Yes Diagnosis Coding Wound Cleansing Wound #1 Left,Medial Lower Leg o Clean wound with Normal Saline. Wound #2 Left,Lateral Calcaneus o Clean wound with Normal Saline. Anesthetic Wound #1 Left,Medial Lower Leg o Topical Lidocaine 4% cream applied to wound bed prior to debridement Wound #2 Left,Lateral Calcaneus o Topical Lidocaine 4% cream applied to wound  bed prior to debridement Primary Wound Dressing Wound #1 Left,Medial Lower Leg o Aquacel Ag Wound #2 Left,Lateral Calcaneus o Santyl Ointment Secondary Dressing Wound #1 Left,Medial Lower Leg o ABD pad Wound #2 Left,Lateral Calcaneus o ABD pad Dressing Change Frequency Wound #1 Left,Medial Lower Leg o Change dressing every week Wound #2 Left,Lateral Calcaneus Cavallaro, Geraldine B. ([id number]) o Change dressing every week Follow-up Appointments Wound #1 Left,Medial Lower Leg o Return Appointment in 1 week. Wound #2 Left,Lateral Calcaneus o Return Appointment in 1 week. Edema Control Wound #1 Left,Medial Lower Leg o Kerlix and Coban - Left Lower Extremity Wound #2 Left,Lateral Calcaneus o Kerlix and Coban - Left Lower Extremity Off-Loading Wound #2 Left,Lateral Calcaneus o Other: - open heel shoe Additional Orders / Instructions Wound #1 Left,Medial Lower Leg o Increase protein intake. Wound #2 Left,Lateral Calcaneus o Increase protein intake. Home Health Wound #1 [place] for [place] Nurse may visit PRN to address patientos wound care needs. o FACE TO FACE ENCOUNTER: MEDICARE and MEDICAID PATIENTS: I certify that this patient is under my care and that I had a face-to-face encounter that meets the physician  face-to-face encounter requirements with this patient on this date. The encounter with the patient was in whole or in part for the following MEDICAL CONDITION: (primary reason for [place]) MEDICAL NECESSITY: I certify, that based on my findings, NURSING services are a medically necessary home health service. HOME BOUND STATUS: I certify that my clinical findings support that this patient is homebound (i.e., Due to illness or injury, pt requires aid of supportive devices such as crutches, cane, wheelchairs, walkers, the use of special transportation or the assistance of another person to leave their place of residence. There is a normal inability to leave the home and doing so requires considerable and taxing effort. Other absences are for medical reasons / religious services and are infrequent or of short duration when for other reasons). o If current dressing causes regression in wound condition, may D/C ordered dressing product/s and apply Normal Saline Moist Dressing daily until next [place] / Other MD appointment. [place] of regression in wound condition at [phone]. KITANA, GILANI ([id number]) o Please direct any NON-WOUND related issues/requests for orders to patient's Primary Care Physician Wound #2 [place] for [place] Nurse may visit PRN to address patientos wound care needs. o FACE TO FACE ENCOUNTER: MEDICARE and MEDICAID PATIENTS: I certify that this patient is under my care and that I had a face-to-face encounter that meets the physician face-to-face encounter requirements with this patient on this date. The encounter with the patient was in whole or in part for the following MEDICAL CONDITION: (primary reason for [place]) MEDICAL NECESSITY: I certify, that based on my findings, NURSING services are a medically necessary home health service. HOME BOUND  STATUS: I certify that my clinical findings support that this patient is homebound (i.e., Due to illness or injury, pt requires aid of supportive devices such as crutches, cane, wheelchairs, walkers, the use of special transportation or the assistance of another person to leave their place of residence. There is a normal inability to leave the home and doing so requires considerable and taxing effort. Other absences are for medical reasons / religious services and are infrequent or of short duration when for other reasons). o If current dressing causes regression in wound condition, may D/C ordered dressing  product/s and apply Normal Saline Moist Dressing daily until next [place] / Other MD appointment. [place] of regression in wound condition at [phone]. o Please direct any NON-WOUND related issues/requests for orders to patient's Primary Care Physician Medications-please add to medication list. Wound #2 Left,Lateral Calcaneus o Santyl Enzymatic Ointment Radiology o X-ray, heel - left Patient Medications Allergies: Biaxin, gabapentin, lisinopril, metformin Notifications Medication Indication Start End doxycycline monohydrate 08/21/2016 DOSE bid - oral 100 mg capsule - bid capsule oral Electronic Signature(s) Signed: 08/21/2016 10:08:27 AM By: Linton Ham MD Previous Signature: 08/21/2016 10:07:04 AM Version By: Linton Ham MD Entered By: Linton Ham on 08/21/2016 10:08:26 [name] ([id number]) -------------------------------------------------------------------------------- Problem List Details Patient Name: [name] Date of Service: 08/21/2016 8:00 AM Medical Record Patient Account Number: [id number] Number: Treating RN: Montey Hora May 23, 1941 (76 y.o. 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/21/2016 Yes necrosis of muscle I87.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 hypertension (idiopathic) with 08/21/2016 Yes inflammation of left lower extremity L97.221 Non-pressure chronic ulcer of left calf limited to 08/21/2016 Yes breakdown of skin L03.116 Cellulitis of left lower limb 08/21/2016 Yes Inactive Problems Resolved Problems CAYLI, BREHM ([id number]) Electronic Signature(s) Signed: 08/21/2016 6:05:53 PM By: Linton Ham MD Entered By: Linton Ham on 08/21/2016 09:54:33 Passon, [name] ([id number]) -------------------------------------------------------------------------------- Progress Note Details Patient Name: [name] Date of Service: 08/21/2016 8:00 AM Medical Record Patient Account Number: [id number] Number: Treating RN: Montey Hora [birth year] (76 y.o. Other Clinician: Date of Birth/Sex: Female) Treating ROBSON, MICHAEL Primary Care Physician/Extender: Ronne Binning Physician: Referring Physician: Weeks in Treatment: 0 Subjective Chief Complaint Information obtained from Patient Patient is here for review of a wound on the left heel History of Present Illness (HPI) 08/21/16; [name] is a 76 year old woman who I cared for in our [facility] clinic up until January 2016. At that point she had a wound in her left great toe. 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  He follows with vein and vascular and has a follow-up appointment in early February. She is status left femoral to anterior tibial bypass in February 2016. She is also noted to have venous stasis changes and a history of ulcers. 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 fib on Eliquis. She has bladder cancer and has a chronic urostomy. The patient tells me that 2 weeks ago she had a right prosthesis and a new left diabetic shoe. Roughly one week ago she noted a wound on her left lateral heel. She zinc oxide and some silver alginate she had left over until she could be seen here. It was also noted that she has had blistering on her posterior left calf and an open area here as well. Her records note that she has chronic venous  insufficiency with chronic inflammation especially in the left mid calf area. The patient has not been systemically unwell no fever or chills. She is reasonably insensate and has not felt any pain Wound History Patient presents with 2 open wounds that have been present for approximately 2 weeks. Patient has been treating wounds in the following manner: silvercel and bandage. Laboratory tests have not been performed in the last month. Patient reportedly has not tested positive for an antibiotic resistant organism. Patient reportedly has tested positive for osteomyelitis. Patient reportedly has had testing performed to evaluate circulation in the legs. Patient experiences the following problems associated with their wounds: swelling. Patient History Information obtained from Patient. Allergies Theiss, Keshawna B. ([id number]) Biaxin, gabapentin, lisinopril, metformin Social History Former smoker - 1977, Marital Status - Divorced, Alcohol Use - Never, Drug Use - No History, Caffeine Use - Moderate. Medical History Eyes Denies history of  Glaucoma Hematologic/Lymphatic Patient has history of Anemia Cardiovascular Patient has history of Coronary Artery Disease, Hypertension, Myocardial Infarction Endocrine Patient has history of Type II Diabetes Musculoskeletal Patient has history of Osteoarthritis Neurologic Patient has history of Neuropathy Psychiatric Denies history of Anorexia/bulimia, Confinement Anxiety Patient is treated with Insulin. Medical And Surgical History Notes Eyes macular degeneration Genitourinary urostomy Oncologic bladder cancer - removed but no chemo or radiation Review of Systems (ROS) Constitutional Symptoms (General Health) The patient has no complaints or symptoms. Eyes The patient has no complaints or symptoms. Ear/Nose/Mouth/Throat The patient has no complaints or symptoms. Hematologic/Lymphatic The patient has no complaints or symptoms. Respiratory The patient has no complaints or symptoms. Cardiovascular Complains or has symptoms of LE edema. Gastrointestinal The patient has no complaints or symptoms. Genitourinary Complains or has symptoms of Kidney failure/ Dialysis - CKD stage 3. Immunological Reha, Abigaile B. ([id number]) The patient has no complaints or symptoms. Integumentary (Skin) The patient has no complaints or symptoms. Psychiatric The patient has no complaints or symptoms. Objective Constitutional Patient is hypertensive.. Pulse regular and within target range for patient.Marland Kitchen Respirations regular, non-labored and within target range.. Temperature is normal and within the target range for the patient.. Patient's appearance is neat and clean. Appears in no acute distress. Well nourished and well developed.. Vitals Time Taken: 8:16 AM, Height: 63 in, Source: Measured, Weight: 205 lbs, Source: Measured, BMI: 36.3, Temperature: 97.5 F, Pulse: 64 bpm, Respiratory Rate: 18 breaths/min, Blood Pressure: 159/49 mmHg. Eyes Conjunctivae clear. No  discharge.Marland Kitchen Respiratory Respiratory effort is easy and symmetric bilaterally. Rate is normal at rest and on room air.. Bilateral breath sounds are clear and equal in all lobes with no wheezes, rales or rhonchi.. Cardiovascular Heart rate is irregular compatible with known A. fib. Her femoral pulse Center popliteal pulse on the left are palpable however I do not feel anything in her foot. Pedal pulses absent on the left. Edema present in left lower extremity. However this is mild. She wears compression stockings. She has what appears to be venous inflammation circumferentially in the left mid leg however posteriorly this is more red and angry looking and there is blistering. I cannot rule out cellulitis in this area. Gastrointestinal (GI) Abdomen is soft. As noted she has a urostomy. Lymphatic None palpable in the popliteal or inguinal areas on the left. Neurological Completely absent vibration sense she was insensate to my debridement of her heel. Psychiatric No evidence of depression, anxiety, or agitation. Calm, cooperative, and communicative. Appropriate interactions and affect.Ellender Hose, [name] ([id number]) General Notes: 08/21/16 exam; the patient has a fairly substantial wound  on the lateral aspect of her left heel. This had thick adherent necrotic tissue which I debrided with a curet however even after substantial debridement I was unable to see much viable tissue here. I think this is somewhat unusual given the history. There was no evidence of infection around the wound no erythema. On the posterior aspect of her left calf there is blistering and more intense erythema than the circumferential erythema around her mid leg. There is some discomfort here. I don't believe she has any evidence of a DVT. Some of the skin is simply denuding off the surface. I think this is compatible with cellulitis although this could be secondary to inflammation, irritation from her stockings or more  edema Integumentary (Hair, Skin) Wound #1 status is Open. Original cause of wound was Blister. The wound is located on the Left,Medial Lower Leg. The wound measures 5cm length x 5cm width x 0.1cm depth; 19.635cm^2 area and 1.963cm^3 volume. The wound is limited to skin breakdown. There is no tunneling or undermining noted. There is a large amount of serous drainage noted. The wound margin is flat and intact. There is large (67-100%) pink granulation within the wound bed. There is no necrotic tissue within the wound bed. The periwound skin appearance did not exhibit: Callus, Crepitus, Excoriation, Fluctuance, Friable, Induration, Localized Edema, Rash, Scarring, Dry/Scaly, Maceration, Moist, Atrophie Blanche, Cyanosis, Ecchymosis, Hemosiderin Staining, Mottled, Pallor, Rubor, Erythema. Periwound temperature was noted as No Abnormality. Wound #2 status is Open. Original cause of wound was Gradually Appeared. The wound is located on the Left,Lateral Calcaneus. The wound measures 3.3cm length x 2.8cm width x 0.2cm depth; 7.257cm^2 area and 1.451cm^3 volume. The wound is limited to skin breakdown. There is no tunneling or undermining noted. There is a large amount of serous drainage noted. The wound margin is flat and intact. There is no granulation within the wound bed. There is a large (67-100%) amount of necrotic tissue within the wound bed including Eschar. The periwound skin appearance exhibited: Maceration. The periwound skin appearance did not exhibit: Callus, Crepitus, Excoriation, Fluctuance, Friable, Induration, Localized Edema, Rash, Scarring, Dry/Scaly, Moist, Atrophie Blanche, Cyanosis, Ecchymosis, Hemosiderin Staining, Mottled, Pallor, Rubor, Erythema. Periwound temperature was noted as No Abnormality. The periwound has tenderness on palpation. 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 ([id number]) Procedures Wound #2 Wound #2 is a Diabetic Wound/Ulcer of the Lower Extremity located on the Left,Lateral Calcaneus . There was a Skin/Subcutaneous Tissue Debridement [id number]) debridement with total area of 9.24 sq cm performed by Ricard Dillon, MD. with the following instrument(s): Curette to remove Viable and Non-Viable tissue/material including Fibrin/Slough, Eschar, and Subcutaneous after achieving pain control using Lidocaine 4% Topical Solution. A time out was conducted at 08:56, prior to the start of the procedure. A Minimum amount of bleeding was controlled with Pressure. The procedure was tolerated well with a pain level of 0 throughout and a pain level of 0 following the procedure. Post Debridement Measurements: 3.3cm length x 2.8cm width x 0.3cm depth; 2.177cm^3 volume. Character of Wound/Ulcer Post Debridement is  improved. Severity of Tissue Post Debridement is: Fat layer exposed. Post procedure Diagnosis Wound #2: Same as Pre-Procedure Plan Wound Cleansing: Wound #1 Left,Medial Lower Leg: Clean wound with Normal Saline. Wound #2 Left,Lateral Calcaneus: Clean wound with Normal Saline. Anesthetic: Wound #1 Left,Medial Lower Leg: Topical Lidocaine 4% cream applied to wound bed prior to debridement Wound #2 Left,Lateral Calcaneus: Topical Lidocaine 4% cream applied to wound bed prior to debridement Primary Wound Dressing: Wound #1 Left,Medial Lower Leg: Aquacel Ag Wound #2 Left,Lateral Calcaneus: Santyl Ointment Secondary  Dressing: Wound #1 Left,Medial Lower Leg: ABD pad Wound #2 Left,Lateral Calcaneus: ABD pad Dressing Change Frequency: Wound #1 Left,Medial Lower Leg: Change dressing every week Wound #2 Left,Lateral Calcaneus: Change dressing every week Raimondi, Aubreanna B. ([id number]) Follow-up Appointments: Wound #1 Left,Medial Lower Leg: Return Appointment in 1 week. Wound #2 Left,Lateral Calcaneus: Return Appointment in 1 week. Edema Control: Wound #1 Left,Medial Lower Leg: Kerlix and Coban - Left Lower Extremity Wound #2 Left,Lateral Calcaneus: Kerlix and Coban - Left Lower Extremity Off-Loading: Wound #2 Left,Lateral Calcaneus: Other: - open heel shoe Additional Orders / Instructions: Wound #1 Left,Medial Lower Leg: Increase protein intake. Wound #2 Left,Lateral Calcaneus: Increase protein intake. Home Health: Wound #1 Left,Medial Lower Leg: [place] for [place] Nurse may visit PRN to address patient s wound care needs. FACE TO FACE ENCOUNTER: MEDICARE and MEDICAID PATIENTS: I certify that this patient is under my care and that I had a face-to-face encounter that meets the physician face-to-face encounter requirements with this patient on this date. The encounter with the patient was in whole or in part for the following MEDICAL CONDITION: (primary reason for [place]) MEDICAL NECESSITY: I certify, that based on my findings, NURSING services are a medically necessary home health service. HOME BOUND STATUS: I certify that my clinical findings support that this patient is homebound (i.e., Due to illness or injury, pt requires aid of supportive devices such as crutches, cane, wheelchairs, walkers, the use of special transportation or the assistance of another person to leave their place of residence. There is a normal inability to leave the home and doing so requires considerable and taxing effort. Other absences are for medical reasons / religious  services and are infrequent or of short duration when for other reasons). If current dressing causes regression in wound condition, may D/C ordered dressing product/s and apply Normal Saline Moist Dressing daily until next [place] / Other MD appointment. [place] of regression in wound condition at [phone]. Please direct any NON-WOUND related issues/requests for orders to patient's Primary Care Physician Wound #2 Left,Lateral Calcaneus: [place] for [place] Nurse may visit PRN to address patient s wound care needs. FACE TO FACE ENCOUNTER: MEDICARE and MEDICAID PATIENTS: I certify that this patient is under my care and that I had a face-to-face encounter that meets the physician face-to-face encounter requirements with this patient on this date. The encounter with the patient was in whole or in part for the following MEDICAL CONDITION: (primary reason for [place]) MEDICAL NECESSITY: I certify, that based on my findings, NURSING services are a medically necessary home health service. HOME BOUND STATUS: I certify that my clinical findings support that this patient is homebound (i.e., Due to illness or injury, pt requires aid of supportive devices such as crutches, cane, wheelchairs, walkers, the use of special transportation or the assistance of another person to leave their place of residence. There is a normal inability  to leave the home and doing so requires considerable and taxing effort. Other absences are for medical reasons / religious services and are infrequent or of short duration when for other reasons). If current dressing causes regression in wound condition, may D/C ordered dressing product/s and apply Normal Saline Moist Dressing daily until next [place] / Other MD appointment. Notify Wound Canniff, Carlisia B. ([id number]) [place] of regression in wound condition at [phone]. Please  direct any NON-WOUND related issues/requests for orders to patient's Primary Care Physician Medications-please add to medication list.: Wound #2 Left,Lateral Calcaneus: Santyl Enzymatic Ointment Radiology ordered were: X-ray, heel - left The following medication(s) was prescribed: doxycycline monohydrate oral 100 mg capsule bid bid capsule oral starting 08/21/2016 #1 doxycycline 100 twice a day empirically for cellulitis and left calf for 14 #2 to the left heel Santyl, there for one reason or another we can get disorders beyond her means then Iodoflex #3 silver alginate to the blistered reddened area on her posterior left calf #4 have home health out to change this 2 times before we see her next week #5 I've asked her to try and get in to see vein and vascular before early February. The wound on her left heel doesn't look like the type of wound that would be only of one week's duration and secondary to an underlying blister. I want to make sure that everything that has been done is done for her blood flow. She has known PAD no ABIs in this clinic today. A previous ABI and January 2016 on the left was 0.63 #6 we use Kerlix and light Event organiser) Signed: 08/21/2016 6:05:53 PM By: Linton Ham MD Entered By: Linton Ham on 08/21/2016 10:13:47 Wilbon, [name] ([id number]) -------------------------------------------------------------------------------- ROS/PFSH Details Patient Name: [name] Date of Service: 08/21/2016 8:00 AM Medical Record Patient Account Number: [id number] Number: Treating RN: Montey Hora November 27, 1940 (76 y.o. Other Clinician: Date of Birth/Sex: Female) Treating ROBSON, MICHAEL Primary Care Physician/Extender: Ronne Binning Physician: Referring Physician: Weeks in Treatment: 0 Information Obtained From Patient Wound History Do you currently have one or more open woundso Yes How many open wounds do you currently  haveo 2 Approximately how long have you had your woundso 2 weeks How have you been treating your wound(s) until nowo silvercel and bandage Has your wound(s) ever healed and then re-openedo No Have you had any lab work done in the past montho No Have you tested positive for an antibiotic resistant organism (MRSA, VRE)o No Have you tested positive for osteomyelitis (bone infection)o Yes Date: 01/11/2015 Have you had any tests for circulation on your legso Yes Who ordered the testo [facility] Where was the test doneo [facility] Have you had other problems associated with your woundso Swelling Cardiovascular Complaints and Symptoms: Positive for: LE edema Medical History: Positive for: Coronary Artery Disease; Hypertension; Myocardial Infarction Genitourinary Complaints and Symptoms: Positive for: Kidney failure/ Dialysis - CKD stage 3 Medical History: Past Medical History Notes: urostomy Constitutional Symptoms (General Health) Complaints and Symptoms: No Complaints or Symptoms Irving, Chaquetta B. ([id number]) Eyes Complaints and Symptoms: No Complaints or Symptoms Medical History: Negative for: Glaucoma Past Medical History Notes: macular degeneration Ear/Nose/Mouth/Throat Complaints and Symptoms: No Complaints or Symptoms Hematologic/Lymphatic Complaints and Symptoms: No Complaints or Symptoms Medical History: Positive for: Anemia Respiratory Complaints and Symptoms: No Complaints or Symptoms Gastrointestinal Complaints and Symptoms: No Complaints or Symptoms Endocrine Medical History: Positive for: Type II Diabetes Time with diabetes: 1985 Treated with: Insulin  Immunological Complaints and Symptoms: No Complaints or Symptoms Integumentary (Skin) Complaints and Symptoms: No Complaints or Symptoms Musculoskeletal Lackey, Taela B. ([id number]) Medical History: Positive for: Osteoarthritis Neurologic Medical History: Positive for: Neuropathy Oncologic Medical  History: Past Medical History Notes: bladder cancer - removed but no chemo or radiation Psychiatric Complaints and Symptoms: No Complaints or Symptoms Medical History: Negative for: Anorexia/bulimia; Confinement Anxiety Immunizations Pneumococcal Vaccine: Received Pneumococcal Vaccination: Yes Immunization Notes: up to date Family and Social History Former smoker - 1977; Marital Status - Divorced; Alcohol Use: Never; Drug Use: No History; Caffeine Use: Moderate; Financial Concerns: No; Food, Clothing or Shelter Needs: No; Support System Lacking: No; Transportation Concerns: No; Advanced Directives: Yes; Living Will: Yes Electronic Signature(s) Signed: 08/21/2016 5:40:28 PM By: Montey Hora Signed: 08/21/2016 6:05:53 PM By: Linton Ham MD Entered By: Montey Hora on 08/21/2016 08:34:59 [place], [name] ([id number]) -------------------------------------------------------------------------------- [place] Details Patient Name: [name] Date of Service: 08/21/2016 Medical Record Patient Account Number: [id number] Number: Treating RN: Montey Hora 1941/01/28 (76 y.o. 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 L97.221 Non-pressure chronic ulcer of left calf limited to breakdown of skin L03.116 Cellulitis of left lower limb Facility Procedures CPT4 Code Description: [id number] - WOUND CARE  VISIT-LEV 3 EST PT Modifier: Quantity: 1 CPT4 Code Description: [id number] - DEB SUBQ TISSUE 20 SQ CM/< ICD-10 Description Diagnosis 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 ([id number]) Modifier: with necrosis Quantity: 1 of muscle Electronic Signature(s) Signed: 08/21/2016 6:05:53 PM By: Linton Ham MD Entered By: Linton Ham on 08/21/2016 10:14:27

## 2016-08-22 NOTE — Progress Notes (Signed)
ALOHA, KNIERIEM ([id number]) Visit Report for 08/21/2016 Abuse/Suicide Risk Screen Details Patient Name: [name], [name] Date of Service: 08/21/2016 8:00 AM Medical Record Patient Account Number: [id number] Number: Treating RN: Montey Hora 1940/11/08 (76 y.o. Other Clinician: Date of Birth/Sex: Female) Treating ROBSON, MICHAEL Primary Care Physician/Extender: Ronne Binning Physician: Referring Physician: Weeks in Treatment: 0 Abuse/Suicide Risk Screen Items Answer ABUSE/SUICIDE RISK SCREEN: Has anyone close to you tried to hurt or harm you recentlyo No Do you feel uncomfortable with anyone in your familyo No Has anyone forced you do things that you didnot want to doo No Do you have any thoughts of harming yourselfo No Patient displays signs or symptoms of abuse and/or neglect. No Electronic Signature(s) Signed: 08/21/2016 5:40:28 PM By: Montey Hora Entered By: Montey Hora on 08/21/2016 08:47:00 Dyk, [name] ([id number]) -------------------------------------------------------------------------------- Activities of Daily Living Details Patient Name: [name] Date of Service: 08/21/2016 8:00 AM Medical Record Patient Account Number: [id number] Number: Treating RN: Montey Hora 11-08-1940 (76 y.o. Other Clinician: Date of Birth/Sex: Female) Treating ROBSON, MICHAEL Primary Care Physician/Extender: Ronne Binning Physician: Referring Physician: Weeks in Treatment: 0 Activities of Daily Living Items Answer Activities of Daily Living (Please select one for each item) Drive Automobile Not Able Take Medications Completely Able Use Telephone Completely Able Care for Appearance Completely Able Use Toilet Completely Able Bath / Shower Need Assistance Dress Self Need Assistance Feed Self Completely Able Walk Need Assistance Get In / Out Bed Completely [place] for Self Need Assistance Electronic Signature(s) Signed: 08/21/2016 5:40:28 PM By: Montey Hora Entered By: Montey Hora on 08/21/2016 08:46:51 Kawamoto, [name] ([id number]) -------------------------------------------------------------------------------- Education Assessment Details Patient Name: [name] Date of Service: 08/21/2016 8:00 AM Medical Record Patient Account Number: [id number] Number: Treating RN: Montey Hora 06/14/1941 (76 y.o. Other Clinician: Date of Birth/Sex: Female) Treating ROBSON, MICHAEL Primary Care Physician/Extender: Ronne Binning Physician: Referring Physician: [place] in Treatment: 0 Primary Learner Assessed: Caregiver Reason Patient is not Primary Learner: wound location Learning Preferences/Education Level/Primary Language Learning Preference: Explanation, Demonstration Highest Education Level: College or Above Preferred Language: English Cognitive Barrier Assessment/Beliefs Language Barrier: No Translator Needed: No Memory Deficit: No Emotional Barrier: No Cultural/Religious Beliefs Affecting Medical No Care: Physical Barrier Assessment Impaired Vision: No Impaired Hearing: No Decreased Hand dexterity: No Knowledge/Comprehension Assessment Knowledge Level: Medium Comprehension Level: Medium Ability to understand written Medium instructions: Ability to understand verbal Medium instructions: Motivation Assessment Anxiety Level: Calm Cooperation: Cooperative Education Importance: Acknowledges Need Interest in Health Problems: Asks Questions Perception: Coherent Willingness to Engage in Self- Medium Management Activities: NAFISA, LEMMER ([id number]) Readiness to Engage in Self- Medium Management Activities: Electronic Signature(s) Signed: 08/21/2016 5:40:28 PM By: Montey Hora Entered By: Montey Hora on 08/21/2016 08:46:22 [place], [name]  ([id number]) -------------------------------------------------------------------------------- Fall Risk Assessment Details Patient Name: [name] Date of Service: 08/21/2016 8:00 AM Medical Record Patient Account Number: [id number] Number: Treating RN: Montey Hora 1941/05/22 (76 y.o. Other Clinician: Date of Birth/Sex: Female) Treating ROBSON, MICHAEL Primary Care Physician/Extender: Ronne Binning Physician: Referring Physician: Weeks in Treatment: 0 Fall Risk Assessment Items Have you had 2 or more falls in the last 12 monthso 0 Yes Have you had any fall that resulted in injury in the last 12 monthso 0 No FALL RISK ASSESSMENT: History of falling - immediate or within 3 months 25 Yes Secondary diagnosis 0 No Ambulatory aid None/bed rest/wheelchair/nurse 0 Yes  Crutches/cane/walker 0 No Furniture 0 No IV Access/Saline Lock 0 No Gait/Training Normal/bed rest/immobile 0 No Weak 10 Yes Impaired 0 No Mental Status Oriented to own ability 0 Yes Electronic Signature(s) Signed: 08/21/2016 5:40:28 PM By: Montey Hora Entered By: Montey Hora on 08/21/2016 08:45:45 [place], [name] ([id number]) -------------------------------------------------------------------------------- Foot Assessment Details Patient Name: [name] Date of Service: 08/21/2016 8:00 AM Medical Record Patient Account Number: [id number] Number: Treating RN: Montey Hora 08-28-1940 (76 y.o. Other Clinician: Date of Birth/Sex: Female) Treating ROBSON, MICHAEL Primary Care Physician/Extender: Ronne Binning Physician: Referring Physician: Weeks in Treatment: 0 Foot Assessment Items Site Locations + = Sensation present, - = Sensation absent, C = Callus, U = Ulcer R = Redness, W = Warmth, M = Maceration, PU = Pre-ulcerative lesion F = Fissure, S = Swelling, D = Dryness Assessment Right: Left: Other Deformity: No No Prior Foot Ulcer: No No Prior  Amputation: No No Charcot Joint: No No Ambulatory Status: Non-ambulatory Assistance Device: Wheelchair GaitEnergy manager) Signed: 08/21/2016 5:40:28 PM By: [name], [name] ([id number]) Entered By: Montey Hora on 08/21/2016 08:48:00 Alles, [name] ([id number]) -------------------------------------------------------------------------------- Nutrition Risk Assessment Details Patient Name: [name] Date of Service: 08/21/2016 8:00 AM Medical Record Patient Account Number: [id number] Number: Treating RN: Montey Hora Jun 15, 1941 (76 y.o. Other Clinician: Date of Birth/Sex: Female) Treating ROBSON, MICHAEL Primary Care Physician/Extender: Ronne Binning Physician: Referring Physician: Weeks in Treatment: 0 Height (in): 63 Weight (lbs): 205 Body Mass Index (BMI): 36.3 Nutrition Risk Assessment Items NUTRITION RISK SCREEN: I have an illness or condition that made me change the kind and/or 0 No amount of food I eat I eat fewer than two meals per day 0 No I eat few fruits and vegetables, or milk products 0 No I have three or more drinks of beer, liquor or wine almost every day 0 No I have tooth or mouth problems that make it hard for me to eat 0 No I don't always have enough money to buy the food I need 0 No I eat alone most of the time 0 No I take three or more different prescribed or over-the-counter drugs a 1 Yes day Without wanting to, I have lost or gained 10 pounds in the last six 0 No months I am not always physically able to shop, cook and/or feed myself 0 No Nutrition Protocols Good Risk Protocol 0 No interventions needed Moderate Risk Protocol Electronic Signature(s) Signed: 08/21/2016 5:40:28 PM By: Montey Hora Entered By: Montey Hora on 08/21/2016 08:45:54

## 2016-08-22 NOTE — Progress Notes (Signed)
CHARQUITA, AHMANN ([id number]) Visit Report for 08/21/2016 Allergy List Details Patient Name: KINDLE, CONEY Date of Service: 08/21/2016 8:00 AM Medical Record Patient Account Number: [id number] Number: Treating RN: Montey Hora May 30, 1941 (76 y.o. Other Clinician: Date of Birth/Sex: Female) Treating ROBSON, MICHAEL Primary Care Physician: Leighton Ruff Physician/Extender: G Referring Physician: Suella Grove in Treatment: 0 Allergies Active Allergies Biaxin gabapentin lisinopril metformin Allergy Notes Electronic Signature(s) Signed: 08/21/2016 5:40:28 PM By: Montey Hora Entered By: Montey Hora on 08/21/2016 08:35:35 [place], [name] ([id number]) -------------------------------------------------------------------------------- Arrival Information Details Patient Name: [name] Date of Service: 08/21/2016 8:00 AM Medical Record Patient Account Number: [id number] Number: Treating RN: Montey Hora 23-Dec-1940 (76 y.o. Other Clinician: Date of Birth/Sex: Female) Treating ROBSON, MICHAEL Primary Care Physician: Leighton Ruff Physician/Extender: G Referring Physician: Suella Grove in Treatment: 0 Visit Information Patient Arrived: Wheel Chair Arrival Time: 08:12 Accompanied By: friend Transfer Assistance: None Patient Identification Verified: Yes Secondary Verification Process Yes Completed: Patient Has Alerts: Yes Patient Alerts: Patient on Blood Thinner eliquis DMII ABI [place] >220 Electronic Signature(s) Signed: 08/21/2016 5:40:28 PM By: Montey Hora Entered By: Montey Hora on 08/21/2016 08:44:55 [place], [name] ([id number]) -------------------------------------------------------------------------------- Clinic Level of Care Assessment Details Patient Name: [name] Date of Service: 08/21/2016 8:00 AM Medical Record Patient Account Number: [id number] Number: Treating RN: Montey Hora 02/25/1941 (76 y.o.  Other Clinician: Date of Birth/Sex: Female) Treating ROBSON, MICHAEL Primary Care Physician: Leighton Ruff Physician/Extender: G Referring Physician: Suella Grove in Treatment: 0 Clinic Level of Care Assessment Items TOOL 1 Quantity Score []  - Use when EandM and Procedure is performed on INITIAL visit 0 ASSESSMENTS - Nursing Assessment / Reassessment X - General Physical Exam (combine w/ comprehensive assessment (listed just 1 20 below) when performed on new pt. evals) X - Comprehensive Assessment (HX, ROS, Risk Assessments, Wounds Hx, etc.) 1 25 ASSESSMENTS - Wound and Skin Assessment / Reassessment []  - Dermatologic / Skin Assessment (not related to wound area) 0 ASSESSMENTS - Ostomy and/or Continence Assessment and Care []  - Incontinence Assessment and Management 0 []  - Ostomy Care Assessment and Management (repouching, etc.) 0 PROCESS - Coordination of Care X - Simple Patient / Family Education for ongoing care 1 15 []  - Complex (extensive) Patient / Family Education for ongoing care 0 X - Staff obtains Consents, Records, Test Results / Process Orders 1 10 []  - Staff telephones HHA, Nursing Homes / Clarify orders / etc 0 []  - Routine Transfer to another Facility (non-emergent condition) 0 []  - Routine Hospital Admission (non-emergent condition) 0 X - New Admissions / Biomedical engineer / Ordering NPWT, Apligraf, etc. 1 15 []  - Emergency Hospital Admission (emergent condition) 0 PROCESS - Special Needs []  - Pediatric / Minor Patient Management 0 Sansoucie, Mylea B. ([id number]) []  - Isolation Patient Management 0 []  - Hearing / Language / Visual special needs 0 []  - Assessment of Community assistance (transportation, D/C planning, etc.) 0 []  - Additional assistance / Altered mentation 0 []  - Support Surface(s) Assessment (bed, cushion, seat, etc.) 0 INTERVENTIONS - Miscellaneous []  - External ear exam 0 []  - Patient Transfer (multiple staff / Civil Service fast streamer / Similar devices)  0 []  - Simple Staple / Suture removal (25 or less) 0 []  - Complex Staple / Suture removal (26 or more) 0 []  - Hypo/Hyperglycemic Management (do not check if billed separately) 0 X - Ankle / Brachial Index (ABI) - do not check if billed separately 1 15 Has the patient been seen at the hospital within the  last three years: Yes Total Score: 100 Level Of Care: New/Established - Level 3 Electronic Signature(s) Signed: 08/21/2016 5:40:28 PM By: Montey Hora Entered By: Montey Hora on 08/21/2016 08:58:27 Shin, [name] ([id number]) -------------------------------------------------------------------------------- Encounter Discharge Information Details Patient Name: [name] Date of Service: 08/21/2016 8:00 AM Medical Record Patient Account Number: [id number] Number: Treating RN: Montey Hora [birth year] (76 y.o. Other Clinician: Date of Birth/Sex: Female) Treating ROBSON, MICHAEL Primary Care Physician: Leighton Ruff Physician/Extender: G Referring Physician: Suella Grove in Treatment: 0 Encounter Discharge Information Items Discharge Pain Level: 0 Discharge Condition: Stable Ambulatory Status: Wheelchair Discharge Destination: Home Transportation: Private Auto Accompanied By: friend Schedule Follow-up Appointment: Yes Medication Reconciliation completed and provided to Patient/Care No Lexie Koehl: Provided on Clinical Summary of Care: 08/21/2016 Form Type Recipient Paper Patient BI Electronic Signature(s) Signed: 08/21/2016 9:34:31 AM By: Ruthine Dose Entered By: Ruthine Dose on 08/21/2016 09:34:31 [place], [name] ([id number]) -------------------------------------------------------------------------------- Lower Extremity Assessment Details Patient Name: [name] Date of Service: 08/21/2016 8:00 AM Medical Record Patient Account Number: [id number] Number: Treating RN: Montey Hora 1941/04/25 (76 y.o. Other Clinician: Date of  Birth/Sex: Female) Treating ROBSON, MICHAEL Primary Care Physician: Leighton Ruff Physician/Extender: G Referring Physician: Suella Grove in Treatment: 0 Edema Assessment Assessed: [Left: No] [Right: No] Edema: [Left: Yes] [Right: Yes] Calf Left: Right: Point of Measurement: 32 cm From Medial Instep 41 cm cm Ankle Left: Right: Point of Measurement: 12 cm From Medial Instep 28 cm cm Vascular Assessment Pulses: Dorsalis Pedis Palpable: [Left:Yes] Doppler Audible: [Left:Yes] Posterior Tibial Palpable: [Left:No] Doppler Audible: [Left:Yes] Extremity colors, hair growth, and conditions: Extremity Color: [Left:Hyperpigmented] Hair Growth on Extremity: [Left:No] Temperature of Extremity: [Left:Warm] Capillary Refill: [Left:< 3 seconds] Toe Nail Assessment Left: Right: Thick: Yes Discolored: Yes Deformed: No Improper Length and Hygiene: No Notes Weihe, Shearon B. ([id number]) ABI [place] >220 Electronic Signature(s) Signed: 08/21/2016 5:40:28 PM By: Montey Hora Entered By: Montey Hora on 08/21/2016 08:45:18 [place], [name] ([id number]) -------------------------------------------------------------------------------- Multi Wound Chart Details Patient Name: [name] Date of Service: 08/21/2016 8:00 AM Medical Record Patient Account Number: [id number] Number: Treating RN: Montey Hora 09-17-1940 (76 y.o. 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 Comorbid History: Anemia, Coronary Artery Anemia, Coronary Artery N/A Disease, Hypertension, Disease, Hypertension, Myocardial Infarction, Myocardial Infarction, Type II Diabetes, Type II Diabetes, Osteoarthritis, Neuropathy Osteoarthritis, Neuropathy Date Acquired: 08/19/2016 08/12/2016 N/A Weeks of Treatment: 0 0 N/A Wound Status: Open Open N/A Clustered Wound: Yes No N/A Measurements L x W x D 5x5x0.1 3.3x2.8x0.2 N/A (cm) Area (cm) : 19.635 7.257 N/A Volume (cm) : 1.963 1.451 N/A % Reduction in Area: 0.00% 0.00% N/A % Reduction in Volume: 0.00% 0.00% N/A Classification: Partial Thickness Grade 1 N/A HBO Classification: Grade 2 N/A N/A Zweber, Clifton B. ([id number]) Exudate Amount: Large Large N/A Exudate Type: Serous Serous N/A Exudate Color: amber amber N/A Foul Odor After N/A Yes N/A Cleansing: Odor Anticipated Due to N/A No N/A Product Use: Wound Margin: Flat and Intact Flat and Intact N/A Granulation Amount: Large (67-100%) None Present (0%) N/A Granulation Quality: Pink N/A N/A Necrotic Amount: None Present (0%)  Large (67-100%) N/A Necrotic Tissue: N/A Eschar N/A Exposed Structures: Fascia: No Fascia: No N/A Fat: No Fat: No Tendon: No Tendon: No Muscle: No Muscle: No Joint: No Joint: No Bone: No Bone: No Limited to Skin Limited to Skin Breakdown Breakdown Epithelialization: Small (1-33%) None N/A Debridement: N/A Debridement [id number]) Pre-procedure N/A 08:56 N/A Verification/Time Out Taken: Pain Control: N/A Lidocaine 4% Topical N/A Solution Tissue Debrided: N/A Necrotic/Eschar, N/A Fibrin/Slough, Subcutaneous Level: N/A Skin/Subcutaneous N/A Tissue Debridement Area (sq N/A 9.24 N/A cm): Instrument: N/A Curette N/A Bleeding: N/A Minimum N/A Hemostasis Achieved: N/A Pressure N/A Procedural Pain: N/A 0 N/A Post Procedural Pain: N/A 0 N/A Debridement Treatment N/A Procedure was tolerated N/A Response: well Post  Debridement N/A 3.3x2.8x0.3 N/A Measurements L x W x D (cm) Post Debridement N/A 2.177 N/A Volume: (cm) Periwound Skin Texture: Edema: No Edema: No N/A Excoriation: No Excoriation: No Grams, Catriona B. ([id number]) Induration: No Induration: No Callus: No Callus: No Crepitus: No Crepitus: No Fluctuance: No Fluctuance: No Friable: No Friable: No Rash: No Rash: No Scarring: No Scarring: No Periwound Skin Maceration: No Maceration: Yes N/A Moisture: Moist: No Moist: No Dry/Scaly: No Dry/Scaly: No Periwound Skin Color: Atrophie Blanche: No Atrophie Blanche: No N/A Cyanosis: No Cyanosis: No Ecchymosis: No Ecchymosis: No Erythema: No Erythema: No Hemosiderin Staining: No Hemosiderin Staining: No Mottled: No Mottled: No Pallor: No Pallor: No Rubor: No Rubor: No Temperature: No Abnormality No Abnormality N/A Tenderness on No Yes N/A Palpation: Wound Preparation: Ulcer Cleansing: Ulcer Cleansing: N/A Rinsed/Irrigated with Rinsed/Irrigated with Saline Saline Topical Anesthetic Topical Anesthetic Applied: None Applied: Other: lidocaine 4% Procedures Performed: N/A Debridement N/A Treatment Notes Electronic Signature(s) Signed: 08/21/2016 6:05:53 PM By: Linton Ham MD Entered By: Linton Ham on 08/21/2016 09:54:50 Gervasi, [name] ([id number]) -------------------------------------------------------------------------------- Multi-Disciplinary Care Plan Details Patient Name: [name] Date of Service: 08/21/2016 8:00 AM Medical Record Patient Account Number: [id number] Number: Treating RN: Montey Hora 1940-11-19 (76 y.o. Other Clinician: Date of Birth/Sex: Female) Treating ROBSON, MICHAEL Primary Care Physician: Leighton Ruff Physician/Extender: G Referring Physician: Suella Grove in Treatment: 0 Active Inactive Abuse / Safety / Falls / Self Care Management Nursing Diagnoses: Potential for falls Goals: Patient will remain  injury free Date Initiated: 08/21/2016 Goal Status: Active Interventions: Assess fall risk on admission and as needed Notes: Necrotic Tissue Nursing Diagnoses: Impaired tissue integrity related to necrotic/devitalized tissue Goals: Necrotic/devitalized tissue will be minimized in the wound bed Date Initiated: 08/21/2016 Goal Status: Active Interventions: Assess patient pain level pre-, during and post procedure and prior to discharge Notes: Orientation to the Wound Care Program Nursing Diagnoses: Knowledge deficit related to the wound healing center program GoalsAIREANA, KNUDSON ([id number]) Patient/caregiver will verbalize understanding of the [facility] Date Initiated: 08/21/2016 Goal Status: Active Interventions: Provide education on orientation to the wound center Notes: Peripheral Neuropathy Nursing Diagnoses: Potential alteration in peripheral tissue perfusion (select prior to confirmation of diagnosis) Goals: Patient/caregiver will verbalize understanding of disease process and disease management Date Initiated: 08/21/2016 Goal Status: Active Interventions: Assess signs and symptoms of neuropathy upon admission and as needed Notes: Venous Leg Ulcer Nursing Diagnoses: Actual venous Insuffiency (use after diagnosis is confirmed) Goals: Patient will maintain optimal edema control Date Initiated: 08/21/2016 Goal Status: Active Interventions: Assess peripheral edema status every visit. Notes: Wound/Skin Impairment Nursing Diagnoses: Impaired tissue integrity Goals: Patient/caregiver will verbalize understanding of skin care regimen Date Initiated: 08/21/2016 Goal Status: Active Weatherholtz, Dayonna B. ([id number]) Ulcer/skin breakdown will have a volume reduction of  30% by week 4 Date Initiated: 08/21/2016 Goal Status: Active Ulcer/skin breakdown will have a volume reduction of 50% by week 8 Date Initiated: 08/21/2016 Goal Status:  Active Ulcer/skin breakdown will have a volume reduction of 80% by week 12 Date Initiated: 08/21/2016 Goal Status: Active Ulcer/skin breakdown will heal within 14 weeks Date Initiated: 08/21/2016 Goal Status: Active Interventions: Assess patient/caregiver ability to obtain necessary supplies Assess patient/caregiver ability to perform ulcer/skin care regimen upon admission and as needed Assess ulceration(s) every visit Notes: Electronic Signature(s) Signed: 08/21/2016 5:40:28 PM By: Montey Hora Entered By: Montey Hora on 08/21/2016 08:55:10 Vincent, [name] ([id number]) -------------------------------------------------------------------------------- Pain Assessment Details Patient Name: [name] Date of Service: 08/21/2016 8:00 AM Medical Record Patient Account Number: [id number] Number: Treating RN: Montey Hora 1940/11/10 (76 y.o. Other Clinician: Date of Birth/Sex: Female) Treating ROBSON, [place] Primary Care Physician: Leighton Ruff Physician/Extender: G Referring Physician: Suella Grove in Treatment: 0 Active Problems Location of Pain Severity and Description of Pain Patient Has Paino No Site Locations Pain Management and Medication Current Pain Management: Notes Topical or injectable lidocaine is offered to patient for acute pain when surgical debridement is performed. If needed, Patient is instructed to use over the counter pain medication for the following 24-48 hours after debridement. Wound care MDs do not prescribed pain medications. Patient has chronic pain or uncontrolled pain. Patient has been instructed to make an appointment with their Primary Care Physician for pain management. Electronic Signature(s) Signed: 08/21/2016 5:40:28 PM By: Montey Hora Entered By: Montey Hora on 08/21/2016 08:15:59 [name] ([id number]) -------------------------------------------------------------------------------- Patient/Caregiver  Education Details Patient Name: [name] Date of Service: 08/21/2016 8:00 AM Medical Record Patient Account Number: [id number] Number: Treating RN: Montey Hora [birth year] (76 y.o. Other Clinician: Date of Birth/Gender: Female) Treating ROBSON, MICHAEL Primary Care Physician: Leighton Ruff Physician/Extender: G Referring Physician: Suella Grove in Treatment: 0 Education Assessment Education Provided To: Patient and Caregiver Education Topics Provided Wound/Skin Impairment: Handouts: Other: wound care as ordered Methods: Demonstration, Explain/Verbal Responses: State content correctly Electronic Signature(s) Signed: 08/21/2016 5:40:28 PM By: Montey Hora Entered By: Montey Hora on 08/21/2016 08:57:01 [place], [name] ([id number]) -------------------------------------------------------------------------------- Wound Assessment Details Patient Name: [name] Date of Service: 08/21/2016 8:00 AM Medical Record Patient Account Number: [id number] Number: Treating RN: Montey Hora 1941/08/01 (76 y.o. Other Clinician: Date of Birth/Sex: Female) Treating ROBSON, [place] Primary Care Physician: Leighton Ruff Physician/Extender: G Referring Physician: Suella Grove in Treatment: 0 Wound Status Wound Number: 1 Primary Venous Leg Ulcer Etiology: Wound Location: Left Lower Leg - Medial Wound Open Wounding Event: Blister Status: Date Acquired: 08/19/2016 Comorbid Anemia, Coronary Artery Disease, Weeks Of Treatment: 0 History: Hypertension, Myocardial Infarction, Clustered Wound: Yes Type II Diabetes, Osteoarthritis, Neuropathy Photos Wound Measurements Length: (cm) 5 Width: (cm) 5 Depth: (cm) 0.1 Area: (cm) 19.635 Volume: (cm) 1.963 % Reduction in Area: 0% % Reduction in Volume: 0% Epithelialization: Small (1-33%) Tunneling: No Undermining: No Wound Description Classification: Partial Thickness Diabetic Severity Earleen Newport): Grade  2 Wound Margin: Flat and Intact Exudate Amount: Large Exudate Type: Serous Exudate Color: amber Wound Bed Granulation Amount: Large (67-100%) Exposed Structure Dubie, Orchid B. ([id number]) Granulation Quality: Pink Fascia Exposed: No Necrotic Amount: None Present (0%) Fat Layer Exposed: No Tendon Exposed: No Muscle Exposed: No Joint Exposed: No Bone Exposed: No Limited to Skin Breakdown Periwound Skin Texture Texture Color No Abnormalities Noted: No No Abnormalities Noted: No Callus: No Atrophie Blanche: No Crepitus: No Cyanosis: No Excoriation: No Ecchymosis: No Fluctuance: No Erythema: No Friable:  No Hemosiderin Staining: No Induration: No Mottled: No Localized Edema: No Pallor: No Rash: No Rubor: No Scarring: No Temperature / Pain Moisture Temperature: No Abnormality No Abnormalities Noted: No Dry / Scaly: No Maceration: No Moist: No Wound Preparation Ulcer Cleansing: Rinsed/Irrigated with Saline Topical Anesthetic Applied: None Treatment Notes Wound #1 (Left, Medial Lower Leg) 1. Cleansed with: Clean wound with Normal Saline 4. Dressing Applied: Aquacel Ag 5. Secondary Dressing Applied ABD Pad Notes kerlix and coban wrap Electronic Signature(s) Signed: 08/21/2016 5:40:28 PM By: Montey Hora Entered By: Montey Hora on 08/21/2016 09:51:32 Mumford, [name] ([id number]) -------------------------------------------------------------------------------- Wound Assessment Details Patient Name: [name] Date of Service: 08/21/2016 8:00 AM Medical Record Patient Account Number: [id number] Number: Treating RN: Montey Hora [birth year] (76 y.o. Other Clinician: Date of Birth/Sex: Female) Treating ROBSON, MICHAEL Primary Care Physician: Leighton Ruff Physician/Extender: G Referring Physician: Suella Grove in Treatment: 0 Wound Status Wound Number: 2 Primary Diabetic Wound/Ulcer of the Lower Etiology: Extremity Wound Location:  Left Calcaneus - Lateral Wound Open Wounding Event: Gradually Appeared Status: Date Acquired: 08/12/2016 Comorbid Anemia, Coronary Artery Disease, Weeks Of Treatment: 0 History: Hypertension, Myocardial Infarction, Clustered Wound: No Type II Diabetes, Osteoarthritis, Neuropathy Photos Wound Measurements Length: (cm) 3.3 % Reduction in Ar Width: (cm) 2.8 % Reduction in Vo Depth: (cm) 0.2 Epithelialization Area: (cm) 7.257 Tunneling: Volume: (cm) 1.451 Undermining: ea: 0% lume: 0% : None No No Wound Description Classification: Grade 1 Foul Odor After C Wound Margin: Flat and Intact Due to Product [place] Exudate Amount: Large Exudate Type: Serous Exudate Color: amber leansing: Yes e: No Wound Bed Granulation Amount: None Present (0%) Exposed Structure Necrotic Amount: Large (67-100%) Fascia Exposed: No Haig, Hattie B. ([id number]) Necrotic Quality: Eschar Fat Layer Exposed: No Tendon Exposed: No Muscle Exposed: No Joint Exposed: No Bone Exposed: No Limited to Skin Breakdown Periwound Skin Texture Texture Color No Abnormalities Noted: No No Abnormalities Noted: No Callus: No Atrophie Blanche: No Crepitus: No Cyanosis: No Excoriation: No Ecchymosis: No Fluctuance: No Erythema: No Friable: No Hemosiderin Staining: No Induration: No Mottled: No Localized Edema: No Pallor: No Rash: No Rubor: No Scarring: No Temperature / Pain Moisture Temperature: No Abnormality No Abnormalities Noted: No Tenderness on Palpation: Yes Dry / Scaly: No Maceration: Yes Moist: No Wound Preparation Ulcer Cleansing: Rinsed/Irrigated with Saline Topical Anesthetic Applied: Other: lidocaine 4%, Treatment Notes Wound #2 (Left, Lateral Calcaneus) 1. Cleansed with: Clean wound with Normal Saline 2. Anesthetic Topical Lidocaine 4% cream to wound bed prior to debridement 4. Dressing Applied: Santyl Ointment 5. Secondary Dressing Applied ABD Pad Dry Gauze 7. Secured  with Other (specify in notes) Notes kerlix and coban wrap Electronic Signature(s) LANECIA, PROFFIT ([id number]) Signed: 08/21/2016 5:40:28 PM By: Montey Hora Entered By: Montey Hora on 08/21/2016 09:52:05 Zackery, [name] ([id number]) -------------------------------------------------------------------------------- Vitals Details Patient Name: [name] Date of Service: 08/21/2016 8:00 AM Medical Record Patient Account Number: [id number] Number: Treating RN: Montey Hora 1941-02-21 (76 y.o. Other Clinician: Date of Birth/Sex: Female) Treating ROBSON, MICHAEL Primary Care Physician: Leighton Ruff Physician/Extender: G Referring Physician: Suella Grove in Treatment: 0 Vital Signs Time Taken: 08:16 Temperature (F): 97.5 Height (in): 63 Pulse (bpm): 64 Source: Measured Respiratory Rate (breaths/min): 18 Weight (lbs): 205 Blood Pressure (mmHg): 159/49 Source: Measured Reference Range: 80 - 120 mg / dl Body Mass Index (BMI): 36.3 Electronic Signature(s) Signed: 08/21/2016 5:40:28 PM By: Montey Hora Entered By: Montey Hora on 08/21/2016 08:17:20

## 2016-08-23 ENCOUNTER — Other Ambulatory Visit: Payer: Self-pay | Admitting: Pharmacist

## 2016-08-23 ENCOUNTER — Other Ambulatory Visit: Payer: Self-pay | Admitting: *Deleted

## 2016-08-23 NOTE — Patient Outreach (Signed)
[facility]) Care Management  [place]   08/23/2016  [name] 08/30/1940 [id number]  Subjective: Patient was called regarding medication assistance and management by [facility] NP, Bradd Canary. HIPAA identifiers were obtained.  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  Patient confirmed she has grave difficulty affording her medications, specifically Lantus and Humalog.  She reported receiving samples of insulin for the majority of 2017 and often has periods where she has to "make the insulin stretch".  As such, she does not follow the prescribed basal/bolus regimen.  Patient said she entered the "donut hole" for her Medicare drug coverage in November of last year.     While in the initial coverage phase, patient says she can afford to pay the copays for her medications but cannot when she enters the "donut hole".  Objective:   Current Medications: Current Outpatient Prescriptions  Medication Sig Dispense Refill  . acetaminophen (TYLENOL) 500 MG tablet Take 1,000 mg by mouth every 6 (six) hours as needed for mild pain.     Marland Kitchen amiodarone (PACERONE) 200 MG tablet Take 1 tablet (200 mg total) by mouth daily. 30 tablet 6  . apixaban (ELIQUIS) 5 MG TABS tablet Take 1 tablet (5 mg total) by mouth 2 (two) times daily. 60 tablet 11  . atorvastatin (LIPITOR) 80 MG tablet TAKE 1 TABLET (80 MG TOTAL) BY MOUTH EVERY EVENING. 90 tablet 3  . calcitRIOL (ROCALTROL) 0.25 MCG capsule Take 1 capsule by mouth daily.    . collagenase (SANTYL) ointment Apply 1 application topically daily.    Marland Kitchen doxycycline (MONODOX) 100 MG capsule Take 1 capsule by mouth 2 (two) times daily.    . furosemide (LASIX) 40 MG  tablet Take 80 mg by mouth 2 (two) times daily. Takes all doses as often as she can.     . insulin glargine (LANTUS) 100 UNIT/ML injection Inject 0.3 mLs (30 Units total) into the skin 2 (two) times daily. 55 units in the morning and 60 units at night 10 mL 11  . insulin lispro (HUMALOG) 100 UNIT/ML injection Inject 20-50 Units into the skin daily as needed for high blood sugar. PER SLIDING SCALE    . isosorbide mononitrate (IMDUR) 30 MG 24 hr tablet Take 1 tablet (30 mg total) by mouth daily. Please call and schedule follow up appt for further refills [phone] 30 tablet 2  . Magnesium 400 MG TABS Take 400 mg by mouth daily.     . Melatonin 10 MG TABS Take 10 mg by mouth at bedtime.     . metoprolol succinate (TOPROL-XL) 100 MG 24 hr tablet TAKE 1 TABLET (100 MG TOTAL) BY MOUTH EVERY EVENING. TAKE WITH OR IMMEDIATELY FOLLOWING A MEAL. 90 tablet 2  . Multiple Vitamins-Minerals (PRESERVISION AREDS PO) Take 1 capsule by mouth 2 (two) times daily.    . nitroGLYCERIN (NITROSTAT) 0.6 MG SL tablet Take 0.6 mg by mouth every 5 (five) minutes x 3 doses as needed.    Marland Kitchen omeprazole (PRILOSEC) 20 MG capsule Take 20 mg by mouth every evening.     . polyethylene glycol (MIRALAX / GLYCOLAX) packet Take 17 g by mouth as needed.    . potassium chloride SA (K-DUR,KLOR-CON) 20 MEQ tablet Take 20 mEq by mouth daily.     . Psyllium (VEGETABLE LAXATIVE  PO) Take 4 tablets by mouth at bedtime.    . sodium bicarbonate 650 MG tablet Take 1,300 mg by mouth 2 (two) times daily.    Marland Kitchen senna (SENOKOT) 8.6 MG TABS tablet Take 1 tablet by mouth See admin instructions. Takes 1 tab daily and can take addt'l tab as needed for constipation     No current facility-administered medications for this visit.     Functional Status: In your present state of health, do you have any difficulty performing the following activities: 08/16/2016 11/24/2015  Hearing? N N  Vision? Y N  Difficulty concentrating or making decisions? N N  Walking  or climbing stairs? Y Y  Dressing or bathing? Y Y  Doing errands, shopping? Y -  Conservation officer, nature and eating ? N -  Using the Toilet? N -  In the past six months, have you accidently leaked urine? N -  Do you have problems with loss of bowel control? Y -  Managing your Medications? N -  Managing your Finances? N -  Housekeeping or managing your Housekeeping? Y -  Some recent data might be hidden    Fall/Depression Screening: PHQ 2/9 Scores 08/16/2016  PHQ - 2 Score 4  PHQ- 9 Score 8    Assessment: Medications were reconciled with the patient via telephone:  Drugs sorted by system:  Cardiovascular Atorvastatin Eliquis Furosemide Isosorbide Metoprolol succinate   Gastrointestinal: Omeprazole Polyethylene Glycol (Miralax)  Vegetable laxative (Sennosides/cascara)  Endocrine: Lantus (does not have enough for 1 week of therapy. Does not use the correct dose) Humalog (patient reported not following the sliding scale due to supply issues)  Renal: calcitriol  Topical: Santyl-recently prescribed by Wound Center  Pain: Acetaminophen  Vitamins/Minerals: Magnesium Sodium Bicarbonate Multiple Vitamin (eye vitamin) Potassium Chloride  Infectious Diseases: Doxycycline  Miscellaneous: Melatonin  Medication Assistance:   Patient is not eligible for extra help per preliminary application completion.    Based on income, she will be eligible for patient assistance from [employer] for [employer], [employer] for [employer], and [employer] for [employer] after spending the required amount for medications out of pocket.  However, she does not have active prescriptions for her insulin on file at her pharmacy.     Plan:  1. Contacted Dr. Drema Dallas' office and requested refills of Lantus and Novolog to be [facility] to [facility] patient's [facility] of choice.    2.  Patient will pick up medications from [facility].   3.  Will follow-up with patient via phone call in the next  week.    [employer] to be sure medications were received.    Note routed to PCP.   Karrie Meres, PharmD, [place] [phone]  Phone outreach was completed by [facility] PharmD, Alwyn Ren, whom does not have [facility] access yet, and I am assisting her with her documentation.  Cas was discussed between myself and [place]

## 2016-08-24 NOTE — Patient Outreach (Signed)
Telephone call to follow up on pt wound evaluation. She was seen by Dr. Dellia Nims at the [facility] clinic. He debrided her wound and ordered new treatment. He ordered Xrays and determined that she may have the beginning of osteomylitis in her heel but it is too early to tell. Her foot will need careful wound care and weekly reassessment.    I will call her again next week to follow up.  [name] [place] [phone]

## 2016-08-26 ENCOUNTER — Encounter: Payer: Self-pay | Admitting: Pharmacist

## 2016-08-27 ENCOUNTER — Ambulatory Visit (HOSPITAL_COMMUNITY)
Admission: RE | Admit: 2016-08-27 | Discharge: 2016-08-27 | Disposition: A | Discharge status: Home / Self Care | Payer: PPO | Source: Ambulatory Visit | Attending: Family | Admitting: Family

## 2016-08-27 ENCOUNTER — Encounter (HOSPITAL_COMMUNITY): Payer: PPO

## 2016-08-27 ENCOUNTER — Encounter: Payer: Self-pay | Admitting: Vascular Surgery

## 2016-08-27 ENCOUNTER — Ambulatory Visit (INDEPENDENT_AMBULATORY_CARE_PROVIDER_SITE_OTHER): Payer: PPO | Admitting: Vascular Surgery

## 2016-08-27 ENCOUNTER — Ambulatory Visit: Payer: PPO | Admitting: Vascular Surgery

## 2016-08-27 VITALS — BP 152/78 | HR 55 | Temp 97.2°F | Resp 18 | Ht 64.0 in | Wt 210.0 lb

## 2016-08-27 DIAGNOSIS — S91302A Unspecified open wound, left foot, initial encounter: Secondary | ICD-10-CM

## 2016-08-27 DIAGNOSIS — I739 Peripheral vascular disease, unspecified: Secondary | ICD-10-CM | POA: Diagnosis not present

## 2016-08-27 DIAGNOSIS — Z89412 Acquired absence of left great toe: Secondary | ICD-10-CM | POA: Insufficient documentation

## 2016-08-27 DIAGNOSIS — Z89611 Acquired absence of right leg above knee: Secondary | ICD-10-CM | POA: Diagnosis not present

## 2016-08-27 DIAGNOSIS — I872 Venous insufficiency (chronic) (peripheral): Secondary | ICD-10-CM | POA: Diagnosis not present

## 2016-08-27 MED ORDER — DOXYCYCLINE MONOHYDRATE 100 MG PO CAPS: 100.0000 mg | ORAL_CAPSULE | Freq: Two times a day (BID) | ORAL | 0 refills | Status: DC

## 2016-08-27 NOTE — Progress Notes (Signed)
Vascular and Vein Specialist of [place]  Patient name: [name] MRN: [id number] DOB: 18-Sep-1940 Sex: female  REASON FOR VISIT: left heel wound, referred by Dr. Dellia Nims  HPI: [name] is a 76 y.o. female with 2-3 week history of left lateral heel wound. The patient believes that this wound occurred after she started wearing a new pair of shoes. She has been seeing Dr. Dellia Nims for wound care. She has been receiving Santyl and compression therapy to her left lower extremity. She is also been on doxycycline for 1 week. She denies any fever or chills. There is minimal drainage. She was sent here to evaluate her arterial ability to heal this wound.  She previously underwent left femoral to anterior tibial bypass by Dr. early in February 2016. 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 She has a remote history of right below-knee amputation.  She uses her prosthesis with transfers. She mostly uses the wheelchair at home. He is on Eliquis for atrial fibrillation. Her past medical history includes hyperlipidemia, hypertension, CAD, CKD stage III and type 2 diabetes. She is a former smoker.  Past Medical History:  Diagnosis Date  . Anemia   . Blood transfusion   . CAD (coronary artery disease) 01/26/2013   Evidently circumflex stent in 2014 following bladder cancer surgery for old myocardial infarction.   . CKD (chronic kidney disease) stage 3, GFR 30-59 ml/min 01/26/2013  . GERD (gastroesophageal reflux disease)   . Glaucoma   . History of atrial tachycardia   . History of bladder cancer TCC AND CIS  . Hyperlipidemia   . Hypertension   . Macular degeneration of both eyes   . Myocardial infarction   . Obesity (BMI 30-39.9)   . Peripheral neuropathy (HCC) LEGS AND HANDS  . Peripheral vascular disease (HCC) S/P RIGHT BKA  . Presence of urostomy ([place])   . Retinopathy due to secondary diabetes mellitus ([place])   . 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 Type  2 diabetes mellitus with vascular disease (HCC)     Family History  Problem Relation Age of Onset  . Heart failure Mother     died age 44  . Sudden death Father 28  . Heart disease Father   . Hypertension Father   . Heart attack Father   . Diabetes Brother   . Heart attack Brother   . AAA (abdominal aortic aneurysm) Brother   . Heart disease Brother     SOCIAL HISTORY: Social History  Substance Use Topics  . Smoking status: Former Smoker    Packs/day: 2.00    Years: 17.00    Types: Cigarettes    Quit date: 10/09/1975  . Smokeless tobacco: Never Used  . Alcohol use 0.0 oz/week     Comment: RARE    Allergies  Allergen Reactions  . Gabapentin Other (See Comments)    Extremely drunk and lethargic  . Lisinopril Other (See Comments)    Increased creatinine levels (pt currently taking low dose)  . Metformin And Related Other (See Comments)    Increased creatinine levels  . Biaxin [Clarithromycin] Itching    Current Outpatient Prescriptions  Medication Sig Dispense Refill  . acetaminophen (TYLENOL) 500 MG tablet Take 1,000 mg by mouth every 6 (six) hours as needed for mild pain.     Marland Kitchen amiodarone (PACERONE) 200 MG tablet Take 1 tablet (200 mg total) by mouth daily. 30 tablet 6  . apixaban (ELIQUIS) 5 MG TABS tablet Take  1 tablet (5 mg total) by mouth 2 (two) times daily. 60 tablet 11  . atorvastatin (LIPITOR) 80 MG tablet TAKE 1 TABLET (80 MG TOTAL) BY MOUTH EVERY EVENING. 90 tablet 3  . calcitRIOL (ROCALTROL) 0.25 MCG capsule Take 1 capsule by mouth daily.    . collagenase (SANTYL) ointment Apply 1 application topically daily.    Marland Kitchen doxycycline (MONODOX) 100 MG capsule Take 1 capsule by mouth 2 (two) times daily.    . furosemide (LASIX) 40 MG tablet Take 80 mg by mouth 2 (two) times daily. Takes all doses as often as she can.     . insulin glargine (LANTUS) 100 UNIT/ML injection Inject 0.3 mLs (30 Units total) into the skin 2 (two) times daily. 55 units in the morning and 60  units at night 10 mL 11  . insulin lispro (HUMALOG) 100 UNIT/ML injection Inject 20-50 Units into the skin daily as needed for high blood sugar. PER SLIDING SCALE    . isosorbide mononitrate (IMDUR) 30 MG 24 hr tablet Take 1 tablet (30 mg total) by mouth daily. Please call and schedule follow up appt for further refills [phone] 30 tablet 2  . Magnesium 400 MG TABS Take 400 mg by mouth daily.     . Melatonin 10 MG TABS Take 10 mg by mouth at bedtime.     . metoprolol succinate (TOPROL-XL) 100 MG 24 hr tablet TAKE 1 TABLET (100 MG TOTAL) BY MOUTH EVERY EVENING. TAKE WITH OR IMMEDIATELY FOLLOWING A MEAL. 90 tablet 2  . Multiple Vitamins-Minerals (PRESERVISION AREDS PO) Take 1 capsule by mouth 2 (two) times daily.    . nitroGLYCERIN (NITROSTAT) 0.6 MG SL tablet Take 0.6 mg by mouth every 5 (five) minutes x 3 doses as needed.    Marland Kitchen omeprazole (PRILOSEC) 20 MG capsule Take 20 mg by mouth every evening.     . polyethylene glycol (MIRALAX / GLYCOLAX) packet Take 17 g by mouth as needed.    . potassium chloride SA (K-DUR,KLOR-CON) 20 MEQ tablet Take 20 mEq by mouth daily.     . Psyllium (VEGETABLE LAXATIVE PO) Take 4 tablets by mouth at bedtime.    . senna (SENOKOT) 8.6 MG TABS tablet Take 1 tablet by mouth See admin instructions. Takes 1 tab daily and can take addt'l tab as needed for constipation    . sodium bicarbonate 650 MG tablet Take 1,300 mg by mouth 2 (two) times daily.     No current facility-administered medications for this visit.     REVIEW OF SYSTEMS:  [X]  denotes positive finding, [ ]  denotes negative finding Cardiac  Comments:  Chest pain or chest pressure:    Shortness of breath upon exertion:    Short of breath when lying flat:    Irregular heart rhythm:        Vascular    Pain in calf, thigh, or hip brought on by ambulation:    Pain in feet at night that wakes you up from your sleep:     Blood clot in your veins:    Leg swelling:  x       Pulmonary    Oxygen at home:      Productive cough:     Wheezing:         Neurologic    Sudden weakness in arms or legs:     Sudden numbness in arms or legs:     Sudden onset of difficulty speaking or slurred speech:    Temporary loss of  vision in one eye:     Problems with dizziness:  x       Gastrointestinal    Blood in stool:     Vomited blood:         Genitourinary    Burning when urinating:     Blood in urine:        Psychiatric    Major depression:         Hematologic    Bleeding problems:    Problems with blood clotting too easily:        Skin    Rashes or ulcers: x       Constitutional    Fever or chills:      PHYSICAL EXAM: Vitals:   08/27/16 1355 08/27/16 1356  BP: (!) 150/75 (!) 152/78  Pulse: (!) 55   Resp: 18   Temp: 97.2 F (36.2 C)   TempSrc: Oral   SpO2: 100%   Weight: 210 lb (95.3 kg)   Height: 5\' 4"  (1.626 m)     GENERAL: The patient is a well-nourished female, in no acute distress. The vital signs are documented above. CARDIAC: There is a regular rate and rhythm. Left carotid bruit.  VASCULAR: Palpable 2+ left DP pulse. 2+ pitting edema left leg with chronic venous stasis changes. There is an open wound with yellow exudative material to left lateral heel with minimal serous drainage. No bone exposed. Blister to medial calf.  PULMONARY: There is good air exchange bilaterally without wheezing or rales. ABDOMEN: Soft and non-tender with normal pitched bowel sounds.  MUSCULOSKELETAL: Right BKA with prosthetic leg.  NEUROLOGIC: Decreased sensation to left lower extremity.  SKIN: See vascular section above.  PSYCHIATRIC: The patient has a normal affect.  DATA:  ABIs 08/27/16  R: AKA L: 1.33 with biphasic waveforms  MEDICAL ISSUES: Wound left heel Chronic venous stasis dermatiti  The patient's left femoral to anterior tibial bypass is patent. She should have enough blood flow to heal her left heel wound. Advised continued wound care with Santyl and compression therapy. We  will refill her doxycycline. She will see Dr. Dellia Nims in one week for continued wound evaluation. She will return in 1 month to see NP for a carotid duplex given bruit on exam and wound check.  Virgina Jock, PA-C [facility] MD: Early  I have examined the patient, reviewed and agree with above.  Curt Jews, MD 08/27/2016 3:35 PM

## 2016-08-28 ENCOUNTER — Ambulatory Visit: Payer: PPO | Admitting: Internal Medicine

## 2016-08-29 ENCOUNTER — Other Ambulatory Visit: Payer: Self-pay | Admitting: *Deleted

## 2016-08-29 ENCOUNTER — Other Ambulatory Visit: Payer: Self-pay | Admitting: Physician Assistant

## 2016-08-29 NOTE — Patient Outreach (Signed)
[name] [name]) Care Management  08/29/2016  [name] 1941-05-23 [id number]   Routine home visit.  Pt has been to the wound [name] [name] time. She had to cancel her visit with them this week because of the snow. She has not had any home health services and has been notified by [name] that they would not be able to provide wound care for them.  Pt saw Dr. Donnetta [name] yesterday and wound care was given there. Dr. Donnetta [name] informed pt that her blood supply was adequate to the affected leg.  O: BP (!) 150/50 (BP Location: Right Arm, Patient Position: Sitting, Cuff Size: Normal)   Pulse (!) 50   Resp 16   SpO2 (!) 88% Comment: Pt hands were very cold.       Pt is in her wheelchair, L leg wrapped in an ace bandage to right below her knee. She is wearing the walking shoe on L, RBKA with prosthetic in place..       RRR      Lungs are clear.      Reduced edema of LLE with Ace bandage.      LLE heel wound: Approximately 4 cm X 4 Cm inner fundus 2 cm X 2 cm with grey slough. Provided skin and wound care: Washed and dried LLE, applied Bag Balm to           moisturize calf, cleansed wound with saline, applied Santyl to fundus, covered with gauze and wrapped leg.  A:  LLE diabetic foot wound      P:  I will notify the Wound Clinic that Home Health needs to be ordered with another company.      I have notified pt that I will only be calling her while home health is in. My next scheduled call will be in 2 weeks. Pt advised to call me with any problems or issues as           needed.        [name]   Flowsheet Row Most Recent Value  Care Plan Problem [name]  Worsening decubitus ulcer on L heel  Role Documenting the Problem [name]  Care Management [name]  Active  [name] Long Term Goal (31-90 days)  Pt will attend wound clinic appts over the next 90 days.  [name] Long Term Goal Start Date  08/16/16  Interventions for Problem [name] Long Term  Goal  I provided wound care today, since she had to cancel her visit and home health has not yet begun their services.  [name] CM Short Term Goal #1 (0-30 days)  Pt to arrange appt at the wound clinic in the next 7 days.  [name] CM Short Term Goal #1 Start Date  08/16/16  [name] CM Short Term Goal #1 Met Date  08/30/16  Interventions for Short Term Goal #1  wound care provided today which appears improved since debridement.  [name] CM Short Term Goal #2 (0-30 days)  Pt to purchase new support stockings during the next 30 days.  [name] CM Short Term Goal #2 Start Date  08/16/16  Interventions for Short Term Goal #2  Reapplied ACE after wound care.  [name] CM Short Term Goal #3 (0-30 days)  Pt to elevate her leg as much as possible over the next 30 days.  [name] CM Short Term Goal #3 Start Date  08/16/16  Interventions for Short Tern Goal #3  Repeated  recommendation that pt  must elevate her leg to improve circulation and reduce edema and to relieve pressure.    [name] CM Care Plan Problem Two   Flowsheet Row Most Recent Value  Care Plan Problem Two  Needs transportation.  Role Documenting the Problem Two  Care Management Coordinator  Care Plan for Problem Two  Active  [name] CM Short Term Goal #1 (0-30 days)  Pt to arrange transportation with [name] for MD appts and wound clinic appts.  [name] CM Short Term Goal #1 Start Date  08/16/16  Interventions for Short Term Goal #2   Provided information on transportation service.    [name] CM Care Plan Problem Three   Flowsheet Row Most Recent Value  Care Plan Problem Three  No Advanced Directives  Role Documenting the Problem Three  Care Management Coordinator  Care Plan for Problem Three  Active  [name] CM Short Term Goal #1 (0-30 days)  Pt will complete her Living Will, HCPOA and MOST forms over the next 30 days.  [name] CM Short Term Goal #1 Start Date  08/16/16  Interventions for Short Term Goal #1  Provided documents and discussed the importance of these. Pt does NOT want  CPR. Reviewed MD chart, I did not see any ADs.     [name] [name] [phone]

## 2016-08-30 ENCOUNTER — Other Ambulatory Visit: Payer: Self-pay

## 2016-08-30 DIAGNOSIS — S91302A Unspecified open wound, left foot, initial encounter: Secondary | ICD-10-CM

## 2016-08-30 MED ORDER — DOXYCYCLINE MONOHYDRATE 100 MG PO CAPS: 100.0000 mg | ORAL_CAPSULE | Freq: Two times a day (BID) | ORAL | 0 refills | Status: AC

## 2016-08-30 NOTE — Telephone Encounter (Signed)
Rx refill sent to pharmacy. 

## 2016-09-03 ENCOUNTER — Other Ambulatory Visit: Payer: Self-pay | Admitting: Pharmacist

## 2016-09-03 NOTE — Patient Outreach (Signed)
[facility]) Care Management  09/03/2016  [name] 1940/09/08 [id number]   Patient was called to follow up on purchasing and picking up insulin therapy from her local pharmacy.  (Dr. Drema Dallas' office was called on patient's behalf 08/23/16 and requested new prescriptions for both Lantus and Novolog.)  Patient confirmed, both medications were picked up from [facility].  Patient reported dosing Novolog by guessing versus using a printed/given/prescribed sliding scale from her provider.  In addition, she also reported changing the Lantus dose based on a blood sugar taken before the dose versus following the instructions from Dr. Drema Dallas.  Patient said she did not have instructions on how much Novolog to give.  Patient was educated on how Lantus works to decrease blood sugar and encouraged to follow the instructions given by her provider.  She was also encouraged to keep very accurate records of her blood sugars when she takes them at home as well as any variance in her dose.  Patient seemed very concerned about having a low blood sugar but denied having any blood sugars under 70mg /dl. She stated that she usually went on how she felt versus actually taking her blood sugar if she felt funny.    Hypoglycemia management was reviewed at length.    It was suggested the patient make an appointment with Dr. Drema Dallas and an offer was made to schedule the appointment for her. The patient said she would schedule an appointment but was hesitant to do so due to her wound clinic schedule.  Dr. Drema Dallas' office was called.  I spoke with Sharee Pimple and explained the patient reported she did not have sliding scale instructions and how she was using Lantus. Sharee Pimple spoke with Dr. Drema Dallas stated an appointment needed to be scheduled.    Sharee Pimple said she would reach out to the patient.  I followed up with the patient and she said she would call and schedule an appointment with Dr. Drema Dallas tomorrow after her  wound center appointment because the wound clinic was going to give her a schedule of visits over the next few weeks. Patient confirmed she spoke with Sharee Pimple from Dr. Drema Dallas' office.   Assessment:  1.  Adherence issues:   -- Patient did not understand how Lantus worked and was changing the Lantus dosed based on her blood sugar at dosing time.  --Patient reported she did not have sliding scale instructions for Novolog and was giving herself what she felt was appropriate.  2.  PCP appointment needed   Plan:  1. Follow up with patient in one week to be sure she schedules an appointment with her PCP.

## 2016-09-04 ENCOUNTER — Encounter: Payer: Self-pay | Admitting: Cardiology

## 2016-09-04 ENCOUNTER — Encounter: Payer: PPO | Admitting: Internal Medicine

## 2016-09-04 DIAGNOSIS — E11621 Type 2 diabetes mellitus with foot ulcer: Secondary | ICD-10-CM | POA: Diagnosis not present

## 2016-09-04 DIAGNOSIS — L97423 Non-pressure chronic ulcer of left heel and midfoot with necrosis of muscle: Secondary | ICD-10-CM | POA: Diagnosis not present

## 2016-09-05 NOTE — Progress Notes (Addendum)
[name], [name] ([id number]) Visit Report for 09/04/2016 Chief Complaint Document Details Patient Name: [name], [name] Date of Service: 09/04/2016 12:30 PM Medical Record Patient Account Number: [id number] Number: Treating RN: [name] 02-Sep-1940 (76 y.o. Other Clinician: Date of Birth/Sex: Female) Treating [name] Primary Care Provider: Leighton [name] Provider/Extender: [name] Referring Provider: Bethanie [name] in Treatment: 2 Information Obtained from: Patient Chief Complaint Patient is here for review of a wound on the left heel Electronic Signature(s) Signed: 09/04/2016 6:11:14 PM By: [name] Ham MD Entered By: [name] on 09/04/2016 14:50:54 [name] ([id number]) -------------------------------------------------------------------------------- Debridement Details Patient Name: [name] Date of Service: 09/04/2016 12:30 PM Medical Record Patient Account Number: [id number] Number: Treating RN: [name] 1940-10-30 (76 y.o. Other Clinician: Date of Birth/Sex: Female) Treating [name], [name] Primary Care Provider: Leighton [name] Provider/Extender: [name] Referring Provider: Bethanie [name] in Treatment: 2 Debridement Performed for Wound #2 Left,Lateral Calcaneus Assessment: Performed By: Physician [name] Dillon, MD Debridement: Debridement Pre-procedure Yes - 13:13 Verification/Time Out Taken: Start Time: 13:13 Pain Control: Lidocaine 4% Topical Solution Level: Skin/Subcutaneous Tissue Total Area Debrided (L x 3 (cm) x 2.8 (cm) = 8.4 (cm) W): Tissue and other Viable, Non-Viable, Eschar, Fibrin/Slough, Muscle, Subcutaneous material debrided: Instrument: Blade, Curette, Forceps Bleeding: Moderate Hemostasis Achieved: Pressure End Time: 13:17 Procedural Pain: 0 Post Procedural Pain: 0 Response to Treatment: Procedure was tolerated well Post Debridement Measurements of Total  Wound Length: (cm) 3 Width: (cm) 2.8 Depth: (cm) 0.3 Volume: (cm) 1.979 Character of Wound/Ulcer Post Improved Debridement: Severity of Tissue Post Debridement: Necrosis of muscle Post Procedure Diagnosis Same as Pre-procedure Electronic Signature(s) Signed: 09/04/2016 4:52:36 PM By: [name] Signed: 09/04/2016 6:11:14 PM By: [name] Ham MD [name] ([id number]) Entered By: [name] on 09/04/2016 14:50:33 [name] ([id number]) -------------------------------------------------------------------------------- HPI Details Patient Name: [name] Date of Service: 09/04/2016 12:30 PM Medical Record Patient Account Number: [id number] Number: Treating RN: [name] 1941-08-01 (76 y.o. Other Clinician: Date of Birth/Sex: Female) Treating [name] Primary Care Provider: Leighton [name] Provider/Extender: [name] Referring Provider: Bethanie [name] in Treatment: 2 History of Present Illness HPI Description: 08/21/16; [name] is a 76 year old woman who I cared for in our [facility] clinic up until January 2016. At that point she had a wound in her left great toe. 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 He follows with vein and vascular and has a follow-up appointment in early February. She is status left femoral to anterior tibial bypass in February 2016. She is also noted to have venous stasis changes and a history of ulcers. 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 fib on Eliquis. She has bladder cancer and has a chronic urostomy. The patient tells me that 2 weeks ago she had a right prosthesis and a new left diabetic  shoe. Roughly one week ago she noted a wound on her left lateral heel. She zinc oxide and some silver alginate she had left over until she could be seen here. It was also noted that she has had blistering on her posterior left calf and an open area here as well. Her records note that she has  chronic venous insufficiency with chronic inflammation especially in the left mid calf area. The patient has not been systemically unwell no fever or chills. She is reasonably insensate and has not felt any pain 09/04/16; the patient has a fairly substantial wound on the left lateral calcaneus. We have been using Santyl to this area. Blister on the left lower leg appears to of healed the patient also follows with [name] early of vein and vascular. He felt that she should have enough blood flow to heal her left heel wound. He renewed her doxycycline for a further week. She is status post left femoral to anterior tibial bypass in February 2016. 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 She has a remote history of a right BKA. The XRAY that I ordered last week showed no definite evidence of osteomyelitis however a small focus of demineralization was noted in the plantar aspect of the calcaneus which might warrant an MRI possibly indicating early osteomyelitis however this is not in the area of her wound and I'm going to keep this under advisement for now. Electronic Signature(s) Signed: 09/04/2016 6:11:14 PM By: [name] Ham MD Entered By: [name] on 09/04/2016 15:00:31 [name] [name] ([id number]) [name], [name] ([id number]) -------------------------------------------------------------------------------- Physical Exam Details Patient Name: [name] Date of Service: 09/04/2016 12:30 PM Medical Record Patient Account Number: [id number] Number: Treating RN: [name] 05-02-1941 (76 y.o. Other Clinician: Date of Birth/Sex: Female) Treating [name],  [name] Primary Care Provider: Leighton [name] Provider/Extender: [name] Referring Provider: Leighton [name] Weeks in Treatment: 2 Constitutional Sitting or standing Blood Pressure is within target range for patient.. Pulse regular and within target range for patient.Marland Kitchen Respirations regular, non-labored and within target range.. Temperature is normal and within the target range for the patient.. Patient's appearance is neat and clean. Appears in no acute distress. Well nourished and well developed.. Cardiovascular Left dorsalis pedis pulses palpable. Notes Wound exam; the patient continues to have a fairly substantial wound on the lateral aspect of her left heel. This continues to have thick necrotic tissue which I think is well down in the muscular layer of the heel there is no exposed bone no overt infection. The area on the posterior left calf which was a blister from last week appears to have resolved Electronic Signature(s) Signed: 09/04/2016 6:11:14 PM By: [name] Ham MD Entered By: [name] on 09/04/2016 14:56:21 [name] ([id number]) -------------------------------------------------------------------------------- Physician Orders Details Patient Name: [name] Date of Service: 09/04/2016 12:30 PM Medical Record Patient Account Number: [id number] Number: Treating RN: [name] [birth year] (76 y.o. Other Clinician: Date of Birth/Sex: Female) Treating Shenice Dolder Primary Care Provider: Leighton [name] Provider/Extender: [name] Referring Provider: Bethanie [name] in Treatment: 2 Verbal / Phone Orders: Yes Clinician: Montey [name] Read Back and Verified: Yes Diagnosis Coding Wound Cleansing Wound #2 Left,Lateral Calcaneus o Clean wound with Normal Saline. Anesthetic Wound #2 Left,Lateral Calcaneus o Topical Lidocaine 4% cream applied to wound bed prior to debridement Primary Wound Dressing Wound #2 Left,Lateral  Calcaneus o Santyl Ointment Secondary Dressing Wound #2 Left,Lateral Calcaneus o ABD pad Dressing Change Frequency Wound #2 Left,Lateral Calcaneus o Change dressing every week Follow-up Appointments Wound #2 Left,Lateral Calcaneus o Return Appointment in 1 week. Edema Control Wound #2 Left,Lateral Calcaneus o Kerlix and Coban - Left Lower Extremity Off-Loading Wound #2 Left,Lateral Calcaneus o Other: - open heel shoe [name], [name]. ([id number]) Additional Orders / Instructions Wound #2 Left,Lateral Calcaneus o Increase protein intake. Medications-please add to  medication list. Wound #2 Left,Lateral Calcaneus o Santyl Enzymatic Ointment Electronic Signature(s) Signed: 09/04/2016 4:52:36 PM By: [name] Signed: 09/04/2016 6:11:14 PM By: [name] Ham MD Entered By: [name] on 09/04/2016 13:21:41 Mikita, [name] ([id number]) -------------------------------------------------------------------------------- Problem List Details Patient Name: [name] Date of Service: 09/04/2016 12:30 PM Medical Record Patient Account Number: [id number] Number: Treating RN: [name] 08-Nov-1940 (76 y.o. 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/21/2016 Yes necrosis of muscle I87.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 hypertension (idiopathic) with 08/21/2016 Yes inflammation of left lower extremity L97.221 Non-pressure  chronic ulcer of left calf limited to 08/21/2016 Yes breakdown of skin L03.116 Cellulitis of left lower limb 08/21/2016 Yes Inactive Problems Resolved Problems Electronic Signature(s) [name], [name] ([id number]) Signed: 09/04/2016 6:11:14 PM By: [name] Ham MD Entered By: [name] on 09/04/2016 14:49:01 Peltz, [name] ([id number]) -------------------------------------------------------------------------------- Progress Note Details Patient Name: [name] Date of Service: 09/04/2016 12:30 PM Medical Record Patient Account Number: [id number] Number: Treating RN: [name] 1941/02/16 (76 y.o. Other Clinician: Date of Birth/Sex: Female) Treating Bobbi Kozakiewicz Primary Care Provider: Leighton [name] Provider/Extender: [name] Referring Provider: Bethanie [name] in Treatment: 2 Subjective Chief Complaint Information obtained from Patient Patient is here for review of a wound on the left heel History of Present Illness (HPI) 08/21/16; Mrs. Beamon is a 76 year old woman who I cared for in our [facility] clinic up until January 2016. At that point she had a wound in her left great toe. 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 He follows with vein and vascular and has a follow-up appointment in early February. She is status left femoral to anterior tibial bypass in February 2016. She is also noted to have venous stasis changes and a history of ulcers. 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 fib on Eliquis. She has bladder cancer and has a chronic urostomy. The patient tells me that 2 weeks ago she had a right prosthesis and a new left diabetic shoe.  Roughly one week ago she noted a wound on her left lateral heel. She zinc oxide and some silver alginate she had left over until she could be seen here. It was also noted that she has had blistering on her posterior left calf and an open area here as well. Her records note that she has chronic venous insufficiency with chronic inflammation especially in the left mid calf area. The patient has not been systemically unwell no fever or chills. She is reasonably insensate and has not felt any pain 09/04/16; the patient has a fairly substantial wound on the left lateral calcaneus. We have been using Santyl to this area. Blister on the left lower leg appears to of healed the patient also follows with [name] early of vein and vascular. He  felt that she should have enough blood flow to heal her left heel wound. He renewed her doxycycline for a further week. She is status post left femoral to anterior tibial bypass in February 2016. 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 She has a remote history of a right BKA. The XRAY that I ordered last week showed no definite evidence of osteomyelitis however a small focus of demineralization was noted in the plantar aspect of the calcaneus which might warrant an MRI possibly indicating early osteomyelitis however this is not in the area of her wound and I'm going to keep this under advisement for now. [name], [name]. ([id number]) Objective Constitutional Sitting or standing Blood Pressure is within target range for patient.. Pulse regular and within target range for patient.Marland Kitchen Respirations regular, non-labored and within target range.. Temperature is normal and within the target range for the patient.. Patient's appearance is neat and clean. Appears in no acute distress. Well nourished and well developed.. Vitals Time Taken: 12:40 PM, Height: 63 in, Weight: 205 lbs, BMI: 36.3, Temperature: 97.9 F, Pulse: 66 bpm, Respiratory Rate: 16 breaths/min,  Blood Pressure: 164/41 mmHg. Cardiovascular Left dorsalis pedis pulses palpable. General Notes: Wound exam; the patient continues to have a fairly substantial wound on the lateral aspect of her left heel. This continues to have thick necrotic tissue which I think is well down in the muscular layer of the heel there is no exposed bone no overt infection. The area on the posterior left calf which was a blister from last week appears to have resolved Integumentary (Hair, Skin) Wound #1 status is Healed - Epithelialized. Original cause of wound was Blister. The wound is located on the Left,Medial Lower Leg. The wound measures 0cm length x 0cm width x 0cm depth; 0cm^2 area and 0cm^3 volume. The wound is limited to skin breakdown. There is no tunneling or undermining noted. There is a large amount of serous drainage noted. The wound margin is flat and intact. There is large (67-100%) pink granulation within the wound bed. There is no necrotic tissue within the wound bed. The periwound skin appearance did not exhibit: Callus, Crepitus, Excoriation, Induration, Rash, Scarring, Dry/Scaly, Maceration, Atrophie Blanche, Cyanosis, Ecchymosis, Hemosiderin Staining, Mottled, Pallor, Rubor, Erythema. Periwound temperature was noted as No Abnormality. Wound #2 status is Open. Original cause of wound was Gradually Appeared. The wound is located on the Left,Lateral Calcaneus. The wound measures 3cm length x 2.8cm width x 0.2cm depth; 6.597cm^2 area and 1.319cm^3 volume. The wound is limited to skin breakdown. There is no tunneling or undermining noted. There is a large amount of serous drainage noted. The wound margin is flat and intact. There is no granulation within the wound bed. There is a large (67-100%) amount of necrotic tissue within the wound bed including Eschar. The periwound skin appearance exhibited: Maceration. The periwound skin appearance did not exhibit: Callus, Crepitus, Excoriation,  Induration, Rash, Scarring, Dry/Scaly, Atrophie Blanche, Cyanosis, Ecchymosis, Hemosiderin Staining, Mottled, Pallor, Rubor, Erythema. Periwound temperature was noted as No Abnormality. The periwound has tenderness on palpation. 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 There was a Skin/Subcutaneous Tissue Debridement [id number]) debridement with total area of 8.4 sq cm performed by [name] Dillon, MD. with the following instrument(s): Blade, Curette, and Forceps to remove Viable and Non-Viable tissue/material including Fibrin/Slough, Muscle, Eschar, and Subcutaneous after achieving pain control using Lidocaine 4% Topical Solution. A time out was conducted at 13:13, prior to the start of the procedure. A Moderate amount of bleeding was controlled with Pressure. The procedure was tolerated well with a pain level of 0 throughout and a pain level of 0 following the procedure. Post Debridement Measurements: 3cm length x 2.8cm width x 0.3cm depth; 1.979cm^3 volume. Character of Wound/Ulcer Post Debridement is improved. Severity of Tissue Post Debridement is: Necrosis of muscle. Post procedure Diagnosis Wound #2: Same as Pre-Procedure Plan Wound Cleansing: Wound #2 Left,Lateral Calcaneus: Clean wound with Normal Saline. Anesthetic: Wound #2  Left,Lateral Calcaneus: Topical Lidocaine 4% cream applied to wound bed prior to debridement Primary Wound Dressing: Wound #2 Left,Lateral Calcaneus: Santyl Ointment Secondary Dressing: Mccathern, [name]. ([id number]) Wound #2 Left,Lateral Calcaneus: ABD pad Dressing Change Frequency: Wound #2 Left,Lateral Calcaneus: Change dressing every week Follow-up Appointments: Wound #2 Left,Lateral Calcaneus: Return Appointment in 1 week. Edema Control: Wound #2 Left,Lateral Calcaneus: Kerlix and Coban - Left Lower Extremity Off-Loading: Wound #2 Left,Lateral Calcaneus: Other: - open heel shoe Additional Orders / Instructions: Wound #2 Left,Lateral Calcaneus: Increase protein intake. Medications-please add to medication list.: Wound #2 Left,Lateral Calcaneus: Santyl Enzymatic Ointment Continue santyl/ABD/Kerlix and coban She is completing her second week of doxycycline which should suffice She is seen by vascular surgery, [facility] in their clinic 1.33. It is felt her left femoral to anterior tibila bypass is patent plane xray did not show osteo Electronic Signature(s) Signed: 09/06/2016 8:02:13 AM By: Gretta Cool RN, BSN, Kim RN, BSN Signed: 09/10/2016 5:54:02 PM By: [name] Ham MD Previous Signature: 09/04/2016 6:11:14 PM Version By: [name] Ham MD Entered By: Gretta Cool RN, BSN, Kim on 09/06/2016 08:02:13 IESHEA, MCKOWN ([id number]) -------------------------------------------------------------------------------- SuperBill Details Patient Name: [name] Date of Service: 09/04/2016 Medical Record Patient Account Number: [id number] Number: Treating RN: [name] [birth year] (76 y.o. 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 I87.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 L97.221 Non-pressure chronic ulcer of left calf limited to breakdown of skin L03.116 Cellulitis of left lower limb Facility Procedures CPT4 Code Description: [id number] - DEB MUSC/FASCIA 20 SQ CM/< ICD-10 Description Diagnosis E11.621 Type 2 diabetes mellitus with foot ulcer L97.423 Non-pressure chronic ulcer of left heel and midfoot Modifier: with necrosis Quantity: 1 of muscle Physician Procedures CPT4 Code Description: [id number] - WC PHYS DEBR MUSCLE/FASCIA 20 SQ CM ICD-10 Description Diagnosis E11.621 Type 2 diabetes mellitus with foot ulcer L97.423 Non-pressure chronic ulcer of left heel and midfoot wit Modifier: h necrosis Quantity: 1 of muscle Electronic Signature(s) Signed: 09/04/2016 6:11:14 PM By: [name] Ham MD Entered By: [name] on 09/04/2016 15:01:42

## 2016-09-05 NOTE — Progress Notes (Signed)
ISOBEL, RODIS ([id number]) Visit Report for 09/04/2016 Arrival Information Details Patient Name: SEREENA, ERDAHL Date of Service: 09/04/2016 12:30 PM Medical Record Number: [id number] Patient Account Number: [id number] Date of Birth/Sex: [birth year] (76 y.o. Female) Treating RN: Montey Hora Primary Care Jerick Khachatryan: Leighton Ruff Other Clinician: Referring Aubryana Vittorio: Leighton Ruff Treating Maciej Schweitzer/Extender: Tito Dine in Treatment: 2 Visit Information History Since Last Visit Added or deleted any medications: No Patient Arrived: Wheel Chair Any new allergies or adverse reactions: No Arrival Time: 12:39 Had a fall or experienced change in No Accompanied By: friends activities of daily living that may affect Transfer Assistance: None risk of falls: Patient Identification Verified: Yes Signs or symptoms of abuse/neglect since last No Secondary Verification Process Yes visito Completed: Hospitalized since last visit: No Patient Has Alerts: Yes Has Dressing in Place as Prescribed: Yes Patient Alerts: Patient on Blood Has Compression in Place as Prescribed: Yes Thinner Pain Present Now: No eliquis DMII ABI Shelby >220 Electronic Signature(s) Signed: 09/04/2016 4:52:36 PM By: Montey Hora Entered By: Montey Hora on 09/04/2016 12:39:50 Orsak, [name] ([id number]) -------------------------------------------------------------------------------- Encounter Discharge Information Details Patient Name: [name] Date of Service: 09/04/2016 12:30 PM Medical Record Number: [id number] Patient Account Number: [id number] Date of Birth/Sex: 03/20/1941 (76 y.o. Female) Treating RN: Montey Hora Primary Care Itzamar Traynor: Leighton Ruff Other Clinician: Referring Dahmir Epperly: Leighton Ruff Treating Deanndra Kirley/Extender: Tito Dine in Treatment: 2 Encounter Discharge Information Items Discharge Pain Level: 0 Discharge Condition:  Stable Ambulatory Status: Wheelchair Discharge Destination: Home Transportation: Private Auto Accompanied By: friend Schedule Follow-up Appointment: Yes Medication Reconciliation completed and provided to Patient/Care No Anand Tejada: Provided on Clinical Summary of Care: 09/04/2016 Form Type Recipient Paper Patient BI Electronic Signature(s) Signed: 09/04/2016 4:45:39 PM By: Montey Hora Previous Signature: 09/04/2016 1:41:38 PM Version By: Ruthine Dose Entered By: Montey Hora on 09/04/2016 16:45:39 Sefcik, [name] ([id number]) -------------------------------------------------------------------------------- Lower Extremity Assessment Details Patient Name: [name] Date of Service: 09/04/2016 12:30 PM Medical Record Number: [id number] Patient Account Number: [id number] Date of Birth/Sex: 1941-08-04 (76 y.o. Female) Treating RN: Montey Hora Primary Care Kylene Zamarron: Leighton Ruff Other Clinician: Referring Nicloe Frontera: Leighton Ruff Treating Chief Walkup/Extender: Tito Dine in Treatment: 2 Edema Assessment Assessed: [Left: No] [Right: No] Edema: [Left: Ye] [Right: s] Calf Left: Right: Point of Measurement: 32 cm From Medial Instep cm cm Ankle Left: Right: Point of Measurement: 12 cm From Medial Instep cm cm Vascular Assessment Pulses: Dorsalis Pedis Palpable: [Left:Yes] Posterior Tibial Extremity colors, hair growth, and conditions: Extremity Color: [Left:Hyperpigmented] Hair Growth on Extremity: [Left:No] Temperature of Extremity: [Left:Warm] Capillary Refill: [Left:< 3 seconds] Electronic Signature(s) Signed: 09/04/2016 4:52:36 PM By: Montey Hora Entered By: Montey Hora on 09/04/2016 13:12:45 Pudlo, [name] ([id number]) -------------------------------------------------------------------------------- Multi Wound Chart Details Patient Name: [name] Date of Service: 09/04/2016 12:30 PM Medical Record Number:  [id number] Patient Account Number: [id number] Date of Birth/Sex: [birth year] (76 y.o. 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 Comorbid History: Anemia, Coronary Artery Anemia, Coronary Artery N/A Disease, Hypertension, Disease, Hypertension, Myocardial Infarction, Myocardial Infarction, Type II Diabetes, Type II Diabetes, Osteoarthritis,  Neuropathy Osteoarthritis, Neuropathy Date Acquired: 08/19/2016 08/12/2016 N/A Weeks of Treatment: 2 2 N/A Wound Status: Healed - Epithelialized Open N/A Clustered Wound: Yes No N/A Measurements L x W x D 0x0x0 3x2.8x0.2 N/A (cm) Area (cm) : 0 6.597 N/A Volume (cm) : 0 1.319 N/A % Reduction in Area: 100.00% 9.10% N/A % Reduction in Volume: 100.00% 9.10% N/A Classification: Partial Thickness Grade 1 N/A HBO Classification: Grade 2 N/A N/A Exudate Amount: Large Large N/A Exudate Type: Serous Serous N/A Lyons, Breyona B. ([id number]) Exudate Color: amber amber N/A Foul Odor After N/A Yes N/A Cleansing: Odor Anticipated Due to N/A No N/A Product Use: Wound Margin: Flat and Intact Flat and Intact N/A Granulation Amount: Large (67-100%) None Present (0%) N/A Granulation Quality: Pink N/A N/A Necrotic Amount: None Present (0%) Large (67-100%) N/A Necrotic Tissue: N/A Eschar N/A Exposed Structures: Fascia: No Fascia: No N/A Fat Layer (Subcutaneous Fat Layer (Subcutaneous Tissue) Exposed: No Tissue)  Exposed: No Tendon: No Tendon: No Muscle: No Muscle: No Joint: No Joint: No Bone: No Bone: No Limited to Skin Limited to Skin Breakdown Breakdown Epithelialization: Large (67-100%) None N/A Debridement: N/A Debridement [id number]) Pre-procedure N/A 13:13 N/A Verification/Time Out Taken: Pain Control: N/A Lidocaine 4% Topical N/A Solution Tissue Debrided: N/A Necrotic/Eschar, N/A Fibrin/Slough, Subcutaneous Level: N/A Skin/Subcutaneous N/A Tissue Debridement Area (sq N/A 8.4 N/A cm): Instrument: N/A Blade, Curette, Forceps N/A Bleeding: N/A Moderate N/A Hemostasis Achieved: N/A Pressure N/A Procedural Pain: N/A 0 N/A Post Procedural Pain: N/A 0 N/A Debridement Treatment N/A Procedure was tolerated N/A Response: well Post Debridement N/A 3x2.8x0.3 N/A Measurements L x W x D (cm) Post Debridement N/A 1.979 N/A Volume: (cm) Periwound Skin Texture: Excoriation: No Excoriation: No N/A Induration: No Induration: No Callus: No Callus: No Crepitus: No Crepitus: No Flagler, Lurline B. ([id number]) Rash: No Rash: No Scarring: No Scarring: No Periwound Skin Maceration: No Maceration: Yes N/A Moisture: Dry/Scaly: No Dry/Scaly: No Periwound Skin Color: Atrophie Blanche: No Atrophie Blanche: No N/A Cyanosis: No Cyanosis: No Ecchymosis: No Ecchymosis: No Erythema: No Erythema: No Hemosiderin Staining: No Hemosiderin Staining: No Mottled: No Mottled: No Pallor: No Pallor: No Rubor: No Rubor: No Temperature: No Abnormality No Abnormality N/A Tenderness on No Yes N/A Palpation: Wound Preparation: Ulcer Cleansing: Ulcer Cleansing: N/A Rinsed/Irrigated with Rinsed/Irrigated with Saline Saline Topical Anesthetic Topical Anesthetic Applied: None Applied: Other: lidocaine 4% Procedures Performed: N/A Debridement N/A Treatment Notes Electronic Signature(s) Signed: 09/04/2016 6:11:14 PM By: Linton Ham MD Entered By: Linton Ham on  09/04/2016 14:49:16 Milham, [name] ([id number]) -------------------------------------------------------------------------------- Multi-Disciplinary Care Plan Details Patient Name: [name] Date of Service: 09/04/2016 12:30 PM Medical Record Number: [id number] Patient Account Number: [id number] Date of Birth/Sex: [birth year] (76 y.o. Female) Treating RN: Montey Hora Primary Care Khyleigh Furney: Leighton Ruff Other Clinician: Referring Shrita Thien: Leighton Ruff Treating Latacha Texeira/Extender: Tito Dine in Treatment: 2 Active Inactive ` Abuse / Safety / Falls / Self Care Management Nursing Diagnoses: Potential for falls Goals: Patient will remain injury free Date Initiated: 08/21/2016 Target Resolution Date: 10/08/2016 Goal Status: Active Interventions: Assess fall risk on admission and as needed Notes: ` Necrotic Tissue Nursing Diagnoses: Impaired tissue integrity related to necrotic/devitalized tissue Goals: Necrotic/devitalized tissue will be minimized in the wound bed Date Initiated: 08/21/2016 Target Resolution Date: 10/08/2016 Goal Status: Active Interventions: Assess patient pain level pre-, during and post procedure and prior to discharge Notes: ` Orientation to the Wound Care Program Nursing Diagnoses: Knowledge deficit related to the wound healing center program Lopiccolo, Margrete  BMarland Kitchen ([id number]) Goals: Patient/caregiver will verbalize understanding of the [place] Date Initiated: 08/21/2016 Target Resolution Date: 10/08/2016 Goal Status: Active Interventions: Provide education on orientation to the wound center Notes: ` Peripheral Neuropathy Nursing Diagnoses: Potential alteration in peripheral tissue perfusion (select prior to confirmation of diagnosis) Goals: Patient/caregiver will verbalize understanding of disease process and disease management Date Initiated: 08/21/2016 Target Resolution Date: 10/08/2016 Goal  Status: Active Interventions: Assess signs and symptoms of neuropathy upon admission and as needed Notes: ` Venous Leg Ulcer Nursing Diagnoses: Actual venous Insuffiency (use after diagnosis is confirmed) Goals: Patient will maintain optimal edema control Date Initiated: 08/21/2016 Target Resolution Date: 10/08/2016 Goal Status: Active Interventions: Assess peripheral edema status every visit. Notes: ` Wound/Skin Impairment Nursing Diagnoses: Impaired tissue integrity Gawthrop, Sheelah B. ([id number]) Goals: Patient/caregiver will verbalize understanding of skin care regimen Date Initiated: 08/21/2016 Target Resolution Date: 10/08/2016 Goal Status: Active Ulcer/skin breakdown will have a volume reduction of 30% by week 4 Date Initiated: 08/21/2016 Target Resolution Date: 10/08/2016 Goal Status: Active Ulcer/skin breakdown will have a volume reduction of 50% by week 8 Date Initiated: 08/21/2016 Target Resolution Date: 10/29/2016 Goal Status: Active Ulcer/skin breakdown will have a volume reduction of 80% by week 12 Date Initiated: 08/21/2016 Target Resolution Date: 11/12/2016 Goal Status: Active Ulcer/skin breakdown will heal within 14 weeks Date Initiated: 08/21/2016 Target Resolution Date: 11/12/2016 Goal Status: Active Interventions: Assess patient/caregiver ability to obtain necessary supplies Assess patient/caregiver ability to perform ulcer/skin care regimen upon admission and as needed Assess ulceration(s) every visit Notes: Electronic Signature(s) Signed: 09/04/2016 4:52:36 PM By: Montey Hora Entered By: Montey Hora on 09/04/2016 13:15:41 Herrig, [name] ([id number]) -------------------------------------------------------------------------------- Pain Assessment Details Patient Name: [name] Date of Service: 09/04/2016 12:30 PM Medical Record Number: [id number] Patient Account Number: [id number] Date of Birth/Sex: [birth year] (76 y.o. Female) Treating RN:  Montey Hora Primary Care Armas Mcbee: Leighton Ruff Other Clinician: Referring Kathalina Ostermann: Leighton Ruff Treating Meghen Akopyan/Extender: Tito Dine in Treatment: 2 Active Problems Location of Pain Severity and Description of Pain Patient Has Paino No Site Locations Pain Management and Medication Current Pain Management: Notes Topical or injectable lidocaine is offered to patient for acute pain when surgical debridement is performed. If needed, Patient is instructed to use over the counter pain medication for the following 24-48 hours after debridement. Wound care MDs do not prescribed pain medications. Patient has chronic pain or uncontrolled pain. Patient has been instructed to make an appointment with their Primary Care Physician for pain management. Electronic Signature(s) Signed: 09/04/2016 4:52:36 PM By: Montey Hora Entered By: Montey Hora on 09/04/2016 12:39:58 Nanninga, [name] ([id number]) -------------------------------------------------------------------------------- Patient/Caregiver Education Details Patient Name: [name] Date of Service: 09/04/2016 12:30 PM Medical Record Patient Account Number: [id number] Number: Treating RN: Montey Hora [birth year] (76 y.o. Other Clinician: Date of Birth/Gender: Female) Treating ROBSON, MICHAEL Primary Care Physician: Leighton Ruff Physician/Extender: G Referring Physician: Bethanie Dicker in Treatment: 2 Education Assessment Education Provided To: Patient Education Topics Provided Venous: Handouts: Other: leg elevation Methods: Explain/Verbal Responses: State content correctly Electronic Signature(s) Signed: 09/04/2016 4:52:36 PM By: Montey Hora Entered By: Montey Hora on 09/04/2016 16:46:07 Shiplett, [name] ([id number]) -------------------------------------------------------------------------------- Wound Assessment Details Patient Name: [name] Date of Service: 09/04/2016 12:30 PM Medical Record Number: [id number] Patient Account Number: [id number] Date of Birth/Sex: [birth year] (76 y.o. Female) Treating RN: Montey Hora Primary Care Shireen Rayburn: Leighton Ruff Other Clinician: Referring Issabela Lesko: Leighton Ruff Treating Kerrianne Jeng/Extender: Ricard Dillon Weeks in Treatment: 2 Wound Status  Wound Number: 1 Primary Venous Leg Ulcer Etiology: Wound Location: Left Lower Leg - Medial Wound Healed - Epithelialized Wounding Event: Blister Status: Date Acquired: 08/19/2016 Comorbid Anemia, Coronary Artery Disease, Weeks Of Treatment: 2 History: Hypertension, Myocardial Infarction, Clustered Wound: Yes Type II Diabetes, Osteoarthritis, Neuropathy Photos Wound Measurements Length: (cm) 0 % Reduction Width: (cm) 0 % Reduction Depth: (cm) 0 Epitheliali Area: (cm) 0 Tunneling: Volume: (cm) 0 Underminin in Area: 100% in Volume: 100% zation: Large (67-100%) No g: No Wound Description Classification: Partial Thickness Diabetic Severity (Wagner): Grade 2 Wound Margin: Flat and Intact Exudate Amount: Large Exudate Type: Serous Exudate Color: amber Wound Bed Granulation Amount: Large (67-100%) Exposed Structure Granulation Quality: Pink Fascia Exposed: No Necrotic Amount: None Present (0%) Fat Layer (Subcutaneous Tissue) Exposed: No Venson, Laurabeth B. ([id number]) Tendon Exposed: No Muscle Exposed: No Joint Exposed: No Bone Exposed: No Limited to Skin Breakdown Periwound Skin Texture Texture Color No Abnormalities Noted: No No Abnormalities Noted: No Callus: No Atrophie Blanche: No Crepitus: No Cyanosis: No Excoriation: No Ecchymosis: No Induration: No Erythema: No Rash: No Hemosiderin Staining: No Scarring: No Mottled: No Pallor: No Moisture Rubor: No No Abnormalities Noted: No Dry / Scaly: No Temperature / Pain Maceration: No Temperature: No Abnormality Wound Preparation Ulcer Cleansing:  Rinsed/Irrigated with Saline Topical Anesthetic Applied: None Electronic Signature(s) Signed: 09/04/2016 4:52:36 PM By: Montey Hora Entered By: Montey Hora on 09/04/2016 13:16:41 Vetere, [name] ([id number]) -------------------------------------------------------------------------------- Wound Assessment Details Patient Name: [name] Date of Service: 09/04/2016 12:30 PM Medical Record Number: [id number] Patient Account Number: [id number] Date of Birth/Sex: 04-09-1941 (76 y.o. Female) Treating RN: Montey Hora Primary Care Haizlee Henton: Leighton Ruff Other Clinician: Referring Sven Pinheiro: Leighton Ruff Treating Granvel Proudfoot/Extender: Tito Dine in Treatment: 2 Wound Status Wound Number: 2 Primary Diabetic Wound/Ulcer of the Lower Etiology: Extremity Wound Location: Left Calcaneus - Lateral Wound Open Wounding Event: Gradually Appeared Status: Date Acquired: 08/12/2016 Comorbid Anemia, Coronary Artery Disease, Weeks Of Treatment: 2 History: Hypertension, Myocardial Infarction, Clustered Wound: No Type II Diabetes, Osteoarthritis, Neuropathy Photos Wound Measurements Length: (cm) 3 Width: (cm) 2.8 Depth: (cm) 0.2 Area: (cm) 6.597 Volume: (cm) 1.319 % Reduction in Area: 9.1% % Reduction in Volume: 9.1% Epithelialization: None Tunneling: No Undermining: No Wound Description Classification: Grade 1 Foul Odor Afte Wound Margin: Flat and Intact Due to Product Exudate Amount: Large Slough/Fibrino Exudate Type: Serous Exudate Color: amber r Cleansing: Yes Use: No Yes Wound Bed Granulation Amount: None Present (0%) Exposed Structure Necrotic Amount: Large (67-100%) Fascia Exposed: No Necrotic Quality: Eschar Fat Layer (Subcutaneous Tissue) Exposed: No Tendon Exposed: No Loving, Britnay B. ([id number]) Muscle Exposed: No Joint Exposed: No Bone Exposed: No Limited to Skin Breakdown Periwound Skin Texture Texture Color No Abnormalities  Noted: No No Abnormalities Noted: No Callus: No Atrophie Blanche: No Crepitus: No Cyanosis: No Excoriation: No Ecchymosis: No Induration: No Erythema: No Rash: No Hemosiderin Staining: No Scarring: No Mottled: No Pallor: No Moisture Rubor: No No Abnormalities Noted: No Dry / Scaly: No Temperature / Pain Maceration: Yes Temperature: No Abnormality Tenderness on Palpation: Yes Wound Preparation Ulcer Cleansing: Rinsed/Irrigated with Saline Topical Anesthetic Applied: Other: lidocaine 4%, Treatment Notes Wound #2 (Left, Lateral Calcaneus) 1. Cleansed with: Cleanse wound with antibacterial soap and water 4. Dressing Applied: Santyl Ointment 5. Secondary Dressing Applied ABD Pad 7. Secured with Tape Other (specify in notes) Notes kerlix and coban wrap Electronic Signature(s) Signed: 09/04/2016 4:52:36 PM By: Montey Hora Entered By: Montey Hora on 09/04/2016 13:17:09 Yawn, Amena B. ([id number]) --------------------------------------------------------------------------------  Vitals Details Patient Name: MARLEM, STASKO Date of Service: 09/04/2016 12:30 PM Medical Record Number: [id number] Patient Account Number: [id number] Date of Birth/Sex: 1941/05/16 (76 y.o. Female) Treating RN: Montey Hora Primary Care Alexya Mcdaris: Leighton Ruff Other Clinician: Referring Tamella Tuccillo: Leighton Ruff Treating Gera Inboden/Extender: Tito Dine in Treatment: 2 Vital Signs Time Taken: 12:40 Temperature (F): 97.9 Height (in): 63 Pulse (bpm): 66 Weight (lbs): 205 Respiratory Rate (breaths/min): 16 Body Mass Index (BMI): 36.3 Blood Pressure (mmHg): 164/41 Reference Range: 80 - 120 mg / dl Electronic Signature(s) Signed: 09/04/2016 4:52:36 PM By: Montey Hora Entered By: Montey Hora on 09/04/2016 12:41:07

## 2016-09-06 ENCOUNTER — Encounter (HOSPITAL_BASED_OUTPATIENT_CLINIC_OR_DEPARTMENT_OTHER): Payer: PPO | Attending: Internal Medicine

## 2016-09-11 ENCOUNTER — Encounter: Payer: PPO | Admitting: Internal Medicine

## 2016-09-11 DIAGNOSIS — E11621 Type 2 diabetes mellitus with foot ulcer: Secondary | ICD-10-CM | POA: Diagnosis not present

## 2016-09-11 DIAGNOSIS — L97423 Non-pressure chronic ulcer of left heel and midfoot with necrosis of muscle: Secondary | ICD-10-CM | POA: Diagnosis not present

## 2016-09-12 NOTE — Progress Notes (Signed)
LAYAL, ROACHO ([id number]) Visit Report for 09/11/2016 Arrival Information Details Patient Name: [name], [name] Date of Service: 09/11/2016 12:30 PM Medical Record Number: [id number] Patient Account Number: [id number] Date of Birth/Sex: [birth year] (76 y.o. Female) Treating RN: Montey Hora Primary Care [name]: Leighton Ruff Other Clinician: Referring [name]: Leighton Ruff Treating [name]/Extender: Tito Dine in Treatment: 3 Visit Information History Since Last Visit Added or deleted any medications: No Patient Arrived: Wheel Chair Any new allergies or adverse reactions: No Arrival Time: 12:34 Had a fall or experienced change in No Accompanied By: friend activities of daily living that may affect Transfer Assistance: None risk of falls: Patient Identification Verified: Yes Signs or symptoms of abuse/neglect since last No Secondary Verification Process Yes visito Completed: Hospitalized since last visit: No Patient Has Alerts: Yes Has Dressing in Place as Prescribed: Yes Patient Alerts: Patient on Blood Has Compression in Place as Prescribed: Yes Thinner Pain Present Now: No eliquis DMII ABI  >220 Electronic Signature(s) Signed: 09/11/2016 4:03:51 PM By: Montey Hora Entered By: Montey Hora on 09/11/2016 12:39:14 [name], [name] ([id number]) -------------------------------------------------------------------------------- Encounter Discharge Information Details Patient Name: [name] Date of Service: 09/11/2016 12:30 PM Medical Record Number: [id number] Patient Account Number: [id number] Date of Birth/Sex: 01-14-1941 (76 y.o. Female) Treating RN: Montey Hora Primary Care [name]: Leighton Ruff Other Clinician: Referring Shelby Peltz: Leighton Ruff Treating [name]/Extender: Tito Dine in Treatment: 3 Encounter Discharge Information Items Discharge Pain Level: 0 Discharge Condition:  Stable Ambulatory Status: Wheelchair Discharge Destination: Home Transportation: Private Auto Accompanied By: friend Schedule Follow-up Appointment: Yes Medication Reconciliation completed and provided to Patient/Care No [name]: Provided on Clinical Summary of Care: 09/11/2016 Form Type Recipient Paper Patient BI Electronic Signature(s) Signed: 09/11/2016 3:15:52 PM By: Montey Hora Previous Signature: 09/11/2016 1:41:28 PM Version By: Ruthine Dose Entered By: Montey Hora on 09/11/2016 15:15:51 [name], [name] ([id number]) -------------------------------------------------------------------------------- Lower Extremity Assessment Details Patient Name: [name] Date of Service: 09/11/2016 12:30 PM Medical Record Number: [id number] Patient Account Number: [id number] Date of Birth/Sex: Jan 06, 1941 (76 y.o. Female) Treating RN: Montey Hora Primary Care Demiyah Fischbach: Leighton Ruff Other Clinician: Referring Jc Veron: Leighton Ruff Treating [name]/Extender: Tito Dine in Treatment: 3 Edema Assessment Assessed: [Left: No] [Right: No] Edema: [Left: Ye] [Right: s] Calf Left: Right: Point of Measurement: 32 cm From Medial Instep 36.5 cm cm Ankle Left: Right: Point of Measurement: 12 cm From Medial Instep 26.5 cm cm Vascular Assessment Pulses: Dorsalis Pedis Palpable: [Left:Yes] Posterior Tibial Extremity colors, hair growth, and conditions: Extremity Color: [Left:Hyperpigmented] Hair Growth on Extremity: [Left:No] Temperature of Extremity: [Left:Warm] Capillary Refill: [Left:< 3 seconds] Electronic Signature(s) Signed: 09/11/2016 4:03:51 PM By: Montey Hora Entered By: Montey Hora on 09/11/2016 12:56:38 [name], [name] ([id number]) -------------------------------------------------------------------------------- Multi Wound Chart Details Patient Name: [name] Date of Service: 09/11/2016 12:30 PM Medical Record Number:  [id number] Patient Account Number: [id number] Date of Birth/Sex: 04-12-1941 (76 y.o. Female) Treating RN: Montey Hora Primary Care Sathvika Ojo: Leighton Ruff Other Clinician: Referring Burak Zerbe: Leighton Ruff Treating [name]/Extender: Tito Dine in Treatment: 3 Vital Signs Height(in): 63 Pulse(bpm): 54 Weight(lbs): 205 Blood Pressure 154/45 (mmHg): Body Mass Index(BMI): 36 Temperature(F): 98.1 Respiratory Rate 16 (breaths/min): Photos: [N/A:N/A] Wound Location: Left Calcaneus - Lateral N/A N/A Wounding Event: Gradually Appeared N/A N/A Primary Etiology: Diabetic Wound/Ulcer of N/A N/A the Lower Extremity Comorbid History: Anemia, Coronary Artery N/A N/A Disease, Hypertension, Myocardial Infarction, Type II Diabetes, Osteoarthritis, Neuropathy Date Acquired: 08/12/2016 N/A N/A Weeks of Treatment: 3 N/A N/A Wound  Status: Open N/A N/A Measurements L x W x D 2.6x2.4x0.4 N/A N/A (cm) Area (cm) : 4.901 N/A N/A Volume (cm) : 1.96 N/A N/A % Reduction in Area: 32.50% N/A N/A % Reduction in Volume: -35.10% N/A N/A Classification: Grade 1 N/A N/A Exudate Amount: Large N/A N/A Exudate Type: Serous N/A N/A Exudate Color: amber N/A N/A Yes N/A N/A [name], [name] B. ([id number]) Foul Odor After Cleansing: Odor Anticipated Due to No N/A N/A Product Use: Wound Margin: Flat and Intact N/A N/A Granulation Amount: Medium (34-66%) N/A N/A Granulation Quality: Red N/A N/A Necrotic Amount: Medium (34-66%) N/A N/A Necrotic Tissue: Eschar, Adherent Slough N/A N/A Exposed Structures: Fascia: No N/A N/A Fat Layer (Subcutaneous Tissue) Exposed: No Tendon: No Muscle: No Joint: No Bone: No Limited to Skin Breakdown Epithelialization: None N/A N/A Debridement: Debridement [id number] N/A N/A [id number]) Pre-procedure 13:17 N/A N/A Verification/Time Out Taken: Pain Control: Lidocaine 4% Topical N/A N/A Solution Tissue Debrided: Necrotic/Eschar, N/A  N/A Fibrin/Slough, Muscle, Subcutaneous Level: Skin/Subcutaneous N/A N/A Tissue Debridement Area (sq 6.24 N/A N/A cm): Instrument: Curette N/A N/A Bleeding: Minimum N/A N/A Hemostasis Achieved: Pressure N/A N/A Procedural Pain: 0 N/A N/A Post Procedural Pain: 0 N/A N/A Debridement Treatment Procedure was tolerated N/A N/A Response: well Post Debridement 2.6x2.4x0.4 N/A N/A Measurements L x W x D (cm) Post Debridement 1.96 N/A N/A Volume: (cm) Periwound Skin Texture: Excoriation: No N/A N/A Induration: No Callus: No Crepitus: No Blahnik, Melenie B. ([id number]) Rash: No Scarring: No Periwound Skin Maceration: Yes N/A N/A Moisture: Dry/Scaly: No Periwound Skin Color: Atrophie Blanche: No N/A N/A Cyanosis: No Ecchymosis: No Erythema: No Hemosiderin Staining: No Mottled: No Pallor: No Rubor: No Temperature: No Abnormality N/A N/A Tenderness on Yes N/A N/A Palpation: Wound Preparation: Ulcer Cleansing: N/A N/A Rinsed/Irrigated with Saline Topical Anesthetic Applied: Other: lidocaine 4% Procedures Performed: Debridement N/A N/A Treatment Notes Electronic Signature(s) Signed: 09/11/2016 5:08:44 PM By: [name] Entered By: [name] on 09/11/2016 14:14:57 [name], [name] ([id number]) -------------------------------------------------------------------------------- Multi-Disciplinary Care Plan Details Patient Name: [name] Date of Service: 09/11/2016 12:30 PM Medical Record Number: [id number] Patient Account Number: [id number] Date of Birth/Sex: 11-20-1940 (76 y.o. Female) Treating RN: Montey Hora Primary Care Brinton Brandel: Leighton Ruff Other Clinician: Referring Sherial Ebrahim: Leighton Ruff Treating [place]/Extender: Tito Dine in Treatment: 3 Active Inactive ` Abuse / Safety / Falls / Self Care Management Nursing Diagnoses: Potential for falls Goals: Patient will remain injury free Date Initiated:  08/21/2016 Target Resolution Date: 10/08/2016 Goal Status: Active Interventions: Assess fall risk on admission and as needed Notes: ` Necrotic Tissue Nursing Diagnoses: Impaired tissue integrity related to necrotic/devitalized tissue Goals: Necrotic/devitalized tissue will be minimized in the wound bed Date Initiated: 08/21/2016 Target Resolution Date: 10/08/2016 Goal Status: Active Interventions: Assess patient pain level pre-, during and post procedure and prior to discharge Notes: ` Orientation to the Wound Care Program Nursing Diagnoses: Knowledge deficit related to the wound healing center program NEELA, BERKLAND ([id number]) Goals: Patient/caregiver will verbalize understanding of the [place] Date Initiated: 08/21/2016 Target Resolution Date: 10/08/2016 Goal Status: Active Interventions: Provide education on orientation to the wound center Notes: ` Peripheral Neuropathy Nursing Diagnoses: Potential alteration in peripheral tissue perfusion (select prior to confirmation of diagnosis) Goals: Patient/caregiver will verbalize understanding of disease process and disease management Date Initiated: 08/21/2016 Target Resolution Date: 10/08/2016 Goal Status: Active Interventions: Assess signs and symptoms of neuropathy upon admission and as needed Notes: ` Venous Leg Ulcer Nursing Diagnoses: Actual venous Insuffiency (use  after diagnosis is confirmed) Goals: Patient will maintain optimal edema control Date Initiated: 08/21/2016 Target Resolution Date: 10/08/2016 Goal Status: Active Interventions: Assess peripheral edema status every visit. Notes: ` Wound/Skin Impairment Nursing Diagnoses: Impaired tissue integrity Hight, Yitty B. ([id number]) Goals: Patient/caregiver will verbalize understanding of skin care regimen Date Initiated: 08/21/2016 Target Resolution Date: 10/08/2016 Goal Status: Active Ulcer/skin breakdown will have a volume  reduction of 30% by week 4 Date Initiated: 08/21/2016 Target Resolution Date: 10/08/2016 Goal Status: Active Ulcer/skin breakdown will have a volume reduction of 50% by week 8 Date Initiated: 08/21/2016 Target Resolution Date: 10/29/2016 Goal Status: Active Ulcer/skin breakdown will have a volume reduction of 80% by week 12 Date Initiated: 08/21/2016 Target Resolution Date: 11/12/2016 Goal Status: Active Ulcer/skin breakdown will heal within 14 weeks Date Initiated: 08/21/2016 Target Resolution Date: 11/12/2016 Goal Status: Active Interventions: Assess patient/caregiver ability to obtain necessary supplies Assess patient/caregiver ability to perform ulcer/skin care regimen upon admission and as needed Assess ulceration(s) every visit Notes: Electronic Signature(s) Signed: 09/11/2016 4:03:51 PM By: Montey Hora Entered By: Montey Hora on 09/11/2016 13:16:28 [name], [name] ([id number]) -------------------------------------------------------------------------------- Pain Assessment Details Patient Name: [name] Date of Service: 09/11/2016 12:30 PM Medical Record Number: [id number] Patient Account Number: [id number] Date of Birth/Sex: [birth year] (76 y.o. Female) Treating RN: Montey Hora Primary Care Ahmani Daoud: Leighton Ruff Other Clinician: Referring Crew Goren: Leighton Ruff Treating Aiyanna Awtrey/Extender: Tito Dine in Treatment: 3 Active Problems Location of Pain Severity and Description of Pain Patient Has Paino No Site Locations Pain Management and Medication Current Pain Management: Notes Topical or injectable lidocaine is offered to patient for acute pain when surgical debridement is performed. If needed, Patient is instructed to use over the counter pain medication for the following 24-48 hours after debridement. Wound care MDs do not prescribed pain medications. Patient has chronic pain or uncontrolled pain. Patient has been instructed to make  an appointment with their Primary Care Physician for pain management. Electronic Signature(s) Signed: 09/11/2016 4:03:51 PM By: Montey Hora Entered By: Montey Hora on 09/11/2016 12:39:24 [name] ([id number]) -------------------------------------------------------------------------------- Patient/Caregiver Education Details Patient Name: [name] Date of Service: 09/11/2016 12:30 PM Medical Record Patient Account Number: [id number] Number: Treating RN: Montey Hora 12-Jan-1941 (76 y.o. Other Clinician: Date of Birth/Gender: Female) Treating ROBSON, MICHAEL Primary Care Physician: Leighton Ruff Physician/Extender: G Referring Physician: Bethanie Dicker in Treatment: 3 Education Assessment Education Provided To: Patient and Caregiver Education Topics Provided Venous: Handouts: Other: continue elevation Methods: Explain/Verbal Responses: State content correctly Electronic Signature(s) Signed: 09/11/2016 4:03:51 PM By: Montey Hora Entered By: Montey Hora on 09/11/2016 15:16:08 [name], [name] ([id number]) -------------------------------------------------------------------------------- Wound Assessment Details Patient Name: [name] Date of Service: 09/11/2016 12:30 PM Medical Record Number: [id number] Patient Account Number: [id number] Date of Birth/Sex: 08-29-1940 (76 y.o. Female) Treating RN: Montey Hora Primary Care Zellie Jenning: Leighton Ruff Other Clinician: Referring Cain Fitzhenry: Leighton Ruff Treating Darnisha Vernet/Extender: Tito Dine in Treatment: 3 Wound Status Wound Number: 2 Primary Diabetic Wound/Ulcer of the Lower Etiology: Extremity Wound Location: Left Calcaneus - Lateral Wound Open Wounding Event: Gradually Appeared Status: Date Acquired: 08/12/2016 Comorbid Anemia, Coronary Artery Disease, Weeks Of Treatment: 3 History: Hypertension, Myocardial Infarction, Clustered Wound: No Type  II Diabetes, Osteoarthritis, Neuropathy Photos Wound Measurements Length: (cm) 2.6 Width: (cm) 2.4 Depth: (cm) 0.4 Area: (cm) 4.901 Volume: (cm) 1.96 % Reduction in Area: 32.5% % Reduction in Volume: -35.1% Epithelialization: None Tunneling: No Undermining: No Wound Description Classification: Grade 1 Foul Odor Afte Wound Margin:  Flat and Intact Due to Product Exudate Amount: Large Slough/Fibrino Exudate Type: Serous Exudate Color: amber r Cleansing: Yes Use: No Yes Wound Bed Granulation Amount: Medium (34-66%) Exposed Structure Granulation Quality: Red Fascia Exposed: No Necrotic Amount: Medium (34-66%) Fat Layer (Subcutaneous Tissue) Exposed: No Necrotic Quality: Eschar, Adherent Slough Tendon Exposed: No [name], [name] B. ([id number]) Muscle Exposed: No Joint Exposed: No Bone Exposed: No Limited to Skin Breakdown Periwound Skin Texture Texture Color No Abnormalities Noted: No No Abnormalities Noted: No Callus: No Atrophie Blanche: No Crepitus: No Cyanosis: No Excoriation: No Ecchymosis: No Induration: No Erythema: No Rash: No Hemosiderin Staining: No Scarring: No Mottled: No Pallor: No Moisture Rubor: No No Abnormalities Noted: No Dry / Scaly: No Temperature / Pain Maceration: Yes Temperature: No Abnormality Tenderness on Palpation: Yes Wound Preparation Ulcer Cleansing: Rinsed/Irrigated with Saline Topical Anesthetic Applied: Other: lidocaine 4%, Treatment Notes Wound #2 (Left, Lateral Calcaneus) 1. Cleansed with: Clean wound with Normal Saline Cleanse wound with antibacterial soap and water 2. Anesthetic Topical Lidocaine 4% cream to wound bed prior to debridement 4. Dressing Applied: Santyl Ointment 5. Secondary Dressing Applied ABD Pad Dry Gauze 7. Secured with Tape Other (specify in notes) Notes kerlix and coban wrap Electronic Signature(s) Signed: 09/11/2016 4:03:51 PM By: Montey Hora Entered By: Montey Hora on  09/11/2016 13:17:16 [name], [name] ([id number]) [name], COURTENAY ([id number]) -------------------------------------------------------------------------------- [place] Details Patient Name: [name] Date of Service: 09/11/2016 12:30 PM Medical Record Number: [id number] Patient Account Number: [id number] Date of Birth/Sex: March 25, 1941 (76 y.o. Female) Treating RN: Montey Hora Primary Care Derrich Gaby: Leighton Ruff Other Clinician: Referring Taishaun Levels: Leighton Ruff Treating Delfin Squillace/Extender: Tito Dine in Treatment: 3 Vital Signs Time Taken: 12:39 Temperature (F): 98.1 Height (in): 63 Pulse (bpm): 54 Weight (lbs): 205 Respiratory Rate (breaths/min): 16 Body Mass Index (BMI): 36.3 Blood Pressure (mmHg): 154/45 Reference Range: 80 - 120 mg / dl Electronic Signature(s) Signed: 09/11/2016 4:03:51 PM By: Montey Hora Entered By: Montey Hora on 09/11/2016 12:39:52

## 2016-09-12 NOTE — Progress Notes (Signed)
[name] ([id number]) Visit Report for 09/11/2016 Chief Complaint Document Details Patient Name: [name] Date of Service: 09/11/2016 12:30 PM Medical Record Patient Account Number: [id number] Number: Treating RN: Montey Hora May 14, 1941 (76 y.o. Other Clinician: Date of Birth/Sex: Female) Treating Cabria Micalizzi Primary Care Provider: Leighton Ruff Provider/Extender: G Referring Provider: Bethanie Dicker in Treatment: 3 Information Obtained from: Patient Chief Complaint Patient is here for review of a wound on the left heel Electronic Signature(s) Signed: 09/11/2016 5:08:44 PM By: Linton Ham MD Entered By: Linton Ham on 09/11/2016 14:15:26 [name], [name] ([id number]) -------------------------------------------------------------------------------- Debridement Details Patient Name: [name] Date of Service: 09/11/2016 12:30 PM Medical Record Patient Account Number: [id number] Number: Treating RN: Montey Hora 04-12-1941 (76 y.o. Other Clinician: Date of Birth/Sex: Female) Treating Ameya Kutz Primary Care Provider: Leighton Ruff Provider/Extender: G Referring Provider: Bethanie Dicker in Treatment: 3 Debridement Performed for Wound #2 Left,Lateral Calcaneus Assessment: Performed By: Physician Ricard Dillon, MD Debridement: Debridement Pre-procedure Yes - 13:17 Verification/Time Out Taken: Start Time: 13:17 Pain Control: Lidocaine 4% Topical Solution Level: Skin/Subcutaneous Tissue Total Area Debrided (L x 2.6 (cm) x 2.4 (cm) = 6.24 (cm) W): Tissue and other Viable, Non-Viable, Eschar, Fibrin/Slough, Muscle, Subcutaneous material debrided: Instrument: Curette Bleeding: Minimum Hemostasis Achieved: Pressure End Time: 13:20 Procedural Pain: 0 Post Procedural Pain: 0 Response to Treatment: Procedure was tolerated well Post Debridement Measurements of Total Wound Length:  (cm) 2.6 Width: (cm) 2.4 Depth: (cm) 0.4 Volume: (cm) 1.96 Character of Wound/Ulcer Post Improved Debridement: Severity of Tissue Post Debridement: Necrosis of muscle Post Procedure Diagnosis Same as Pre-procedure Electronic Signature(s) Signed: 09/11/2016 4:03:51 PM By: Montey Hora Signed: 09/11/2016 5:08:44 PM By: Linton Ham MD [name], [name] ([id number]) Entered By: Linton Ham on 09/11/2016 14:15:09 [name], [name] ([id number]) -------------------------------------------------------------------------------- HPI Details Patient Name: [name] Date of Service: 09/11/2016 12:30 PM Medical Record Patient Account Number: [id number] Number: Treating RN: Montey Hora 12-08-1940 (76 y.o. Other Clinician: Date of Birth/Sex: Female) Treating Anupama Piehl Primary Care Provider: Leighton Ruff Provider/Extender: G Referring Provider: Bethanie Dicker in Treatment: 3 History of Present Illness HPI Description: 08/21/16; [name] is a 76 year old woman who I cared for in our [facility] clinic up until January 2016. At that point she had a wound in her left great toe. 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 He follows with vein and vascular and has a follow-up appointment in early February. She is status left femoral to anterior tibial bypass in February 2016. She is also noted to have venous stasis changes and a history of ulcers. 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 fib on Eliquis. She has bladder cancer and has a chronic urostomy. The patient tells me that 2 weeks ago she had a right prosthesis and a new left diabetic shoe. Roughly  one week ago she noted a wound on her left lateral heel. She zinc oxide and some silver alginate she had left over until she could be seen here. It was also noted that she has had blistering on her posterior left calf and an open area here as well. Her records note that she has chronic venous  insufficiency with chronic inflammation especially in the left mid calf area. The patient has not been systemically unwell no fever or chills. She is reasonably insensate and has not felt any pain 09/04/16; the patient has a fairly substantial wound on the left lateral calcaneus. We have been using Santyl to this area. Blister on the left lower leg appears to of healed the patient also follows with Dr. Sherren Mocha early of vein and vascular. He felt that she should have enough blood flow to heal her left heel wound. He renewed her doxycycline for a further week. She is status post left femoral to anterior tibial bypass in February 2016. 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 She has a remote history of a right BKA. The XRAY that I ordered last week showed no definite evidence of osteomyelitis however a small focus of demineralization was noted in the plantar aspect of the calcaneus which might warrant an MRI possibly indicating early osteomyelitis however this is not in the area of her wound and I'm going to keep this under advisement for now. 09/11/16; still a substantial wound in the left lateral calcaneus we've been using Santyl Electronic Signature(s) Signed: 09/11/2016 5:08:44 PM By: Linton Ham MD Entered By: Linton Ham on 09/11/2016 14:16:40 [name], [name] ([id number]) [name], [name] ([id number]) -------------------------------------------------------------------------------- Physical Exam Details Patient Name: [name] Date of Service: 09/11/2016 12:30 PM Medical Record Patient Account Number: [id number] Number: Treating RN: Montey Hora 1941/06/13 (76 y.o.  Other Clinician: Date of Birth/Sex: Female) Treating Leyton Magoon Primary Care Provider: Leighton Ruff Provider/Extender: G Referring Provider: Bethanie Dicker in Treatment: 3 Constitutional Patient is hypertensive.. Pulse regular and within target range for patient.Marland Kitchen Respirations regular, non-labored and within target range.. Temperature is normal and within the target range for the patient.. Patient's appearance is neat and clean. Appears in no acute distress. Well nourished and well developed.. Notes Wound exam; the patient continues to have a fairly substantial wound on the lateral aspect of her left heel. Using a #5 curet I debrided copious amounts of necrotic material of the surface of the wound including nonviable muscle. No evidence of surrounding infection. Even after debridement the condition of this surface is not yet viable. It is precariously close to the bone itself. Hemostasis with silver nitrate and direct pressure Electronic Signature(s) Signed: 09/11/2016 5:08:44 PM By: Linton Ham MD Entered By: Linton Ham on 09/11/2016 14:20:31 Rybacki, [name] ([id number]) -------------------------------------------------------------------------------- Physician Orders Details Patient Name: [name] Date of Service: 09/11/2016 12:30 PM Medical Record Patient Account Number: [id number] Number: Treating RN: Montey Hora [birth year] (76 y.o. Other Clinician: Date of Birth/Sex: Female) Treating Mikia Delaluz Primary Care Provider: Leighton Ruff Provider/Extender: G Referring Provider: Bethanie Dicker in Treatment: 3 Verbal / Phone Orders: Yes Clinician: Montey Hora Read Back and Verified: Yes Diagnosis Coding Wound Cleansing Wound #2 Left,Lateral Calcaneus o Clean wound with Normal Saline. Anesthetic Wound #2 Left,Lateral Calcaneus o Topical Lidocaine 4% cream applied to wound bed prior to debridement Primary  Wound Dressing Wound #2 Left,Lateral Calcaneus o Santyl Ointment Secondary Dressing Wound #2 Left,Lateral Calcaneus o ABD pad Dressing Change Frequency Wound #2 Left,Lateral Calcaneus o Other: - twice weekly - home nurse to change wrap when she visits (may change more than twice weekly if nurse visits more often) Follow-up Appointments Wound #2 Left,Lateral Calcaneus o Return Appointment in 1 week. Edema Control Wound #2 Left,Lateral Calcaneus o Kerlix and Coban - Left Lower Extremity Off-Loading Wound #2 Left,Lateral Calcaneus o  Other: - open heel shoe Deleonardis, [name] B. ([id number]) Additional Orders / Instructions Wound #2 Left,Lateral Calcaneus o Increase protein intake. Medications-please add to medication list. Wound #2 Left,Lateral Calcaneus o Santyl Enzymatic Ointment Electronic Signature(s) Signed: 09/11/2016 4:03:51 PM By: Montey Hora Signed: 09/11/2016 5:08:44 PM By: Linton Ham MD Entered By: Montey Hora on 09/11/2016 13:22:15 Holster, [name] ([id number]) -------------------------------------------------------------------------------- Problem List Details Patient Name: [name] Date of Service: 09/11/2016 12:30 PM Medical Record Patient Account Number: [id number] Number: Treating RN: Montey Hora 1941/01/02 (76 y.o. Other Clinician: Date of Birth/Sex: Female) Treating Sevannah Madia Primary Care Provider: Leighton Ruff Provider/Extender: G Referring Provider: Bethanie Dicker in Treatment: 3 Active Problems ICD-10 Encounter Code Description Active Date Diagnosis E11.621 Type 2 diabetes mellitus with foot ulcer 08/21/2016 Yes E11.51 Type 2 diabetes mellitus with diabetic peripheral 08/21/2016 Yes angiopathy without gangrene E11.40 Type 2 diabetes mellitus with diabetic neuropathy, 08/21/2016 Yes unspecified L97.423 Non-pressure chronic ulcer of left heel and midfoot with 08/21/2016 Yes necrosis of  muscle I87.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 hypertension (idiopathic) with 08/21/2016 Yes inflammation of left lower extremity L97.221 Non-pressure chronic ulcer of left calf limited to 08/21/2016 Yes breakdown of skin L03.116 Cellulitis of left lower limb 08/21/2016 Yes Inactive Problems Resolved Problems Electronic Signature(s) DAYZEE, MARLER ([id number]) Signed: 09/11/2016 5:08:44 PM By: Linton Ham MD Entered By: Linton Ham on 09/11/2016 14:14:45 Tello, [name] ([id number]) -------------------------------------------------------------------------------- Progress Note Details Patient Name: [name] Date of Service: 09/11/2016 12:30 PM Medical Record Patient Account Number: [id number] Number: Treating RN: Montey Hora [birth year] (76 y.o. Other Clinician: Date of Birth/Sex: Female) Treating Da Michelle Primary Care Provider: Leighton Ruff Provider/Extender: G Referring Provider: Bethanie Dicker in Treatment: 3 Subjective Chief Complaint Information obtained from Patient Patient is here for review of a wound on the left heel History of Present Illness (HPI) 08/21/16; [name] is a 76 year old woman who I cared for in our [facility] clinic up until January 2016. At that point she had a wound in her left great toe. 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 He follows with vein and vascular and has a follow-up appointment in early February. She is status left femoral to anterior tibial bypass in February 2016. She is also noted to have venous stasis changes and a history of ulcers. 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 fib on Eliquis. She has bladder  cancer and has a chronic urostomy. The patient tells me that 2 weeks ago she had a right prosthesis and a new left diabetic shoe. Roughly one week ago she noted a wound on her left lateral heel. She zinc oxide and some silver alginate she had left over until she could be seen here. It was also noted that she has had blistering on her posterior left calf and an open area here as well. Her records note that she has chronic venous insufficiency with chronic inflammation especially in the left mid calf area. The patient has not been systemically unwell no fever or chills. She is reasonably insensate and has not felt any pain 09/04/16; the patient has a fairly substantial wound on the left lateral calcaneus. We have been using Santyl to this  area. Blister on the left lower leg appears to of healed the patient also follows with Dr. Sherren Mocha early of vein and vascular. He felt that she should have enough blood flow to heal her left heel wound. He renewed her doxycycline for a further week. She is status post left femoral to anterior tibial bypass in February 2016. 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 She has a remote history of a right BKA. The XRAY that I ordered last week showed no definite evidence of osteomyelitis however a small focus of demineralization was noted in the plantar aspect of the calcaneus which might warrant an MRI possibly indicating early osteomyelitis however this is not in the area of her wound and I'm going to keep this under advisement for now. 09/11/16; still a substantial wound in the left lateral calcaneus we've been using Santyl Thielen, [name] B. ([id number]) Objective Constitutional Patient is hypertensive.. Pulse regular and within target range for patient.Marland Kitchen Respirations regular, non-labored and within target range.. Temperature is normal and within the target range for the patient.. Patient's appearance is neat and clean. Appears in no acute distress. Well  nourished and well developed.. Vitals Time Taken: 12:39 PM, Height: 63 in, Weight: 205 lbs, BMI: 36.3, Temperature: 98.1 F, Pulse: 54 bpm, Respiratory Rate: 16 breaths/min, Blood Pressure: 154/45 mmHg. General Notes: Wound exam; the patient continues to have a fairly substantial wound on the lateral aspect of her left heel. Using a #5 curet I debrided copious amounts of necrotic material of the surface of the wound including nonviable muscle. No evidence of surrounding infection. Even after debridement the condition of this surface is not yet viable. It is precariously close to the bone itself. Hemostasis with silver nitrate and direct pressure Integumentary (Hair, Skin) Wound #2 status is Open. Original cause of wound was Gradually Appeared. The wound is located on the Left,Lateral Calcaneus. The wound measures 2.6cm length x 2.4cm width x 0.4cm depth; 4.901cm^2 area and 1.96cm^3 volume. The wound is limited to skin breakdown. There is no tunneling or undermining noted. There is a large amount of serous drainage noted. The wound margin is flat and intact. There is medium (34-66%) red granulation within the wound bed. There is a medium (34-66%) amount of necrotic tissue within the wound bed including Eschar and Adherent Slough. The periwound skin appearance exhibited: Maceration. The periwound skin appearance did not exhibit: Callus, Crepitus, Excoriation, Induration, Rash, Scarring, Dry/Scaly, Atrophie Blanche, Cyanosis, Ecchymosis, Hemosiderin Staining, Mottled, Pallor, Rubor, Erythema. Periwound temperature was noted as No Abnormality. The periwound has tenderness on palpation. Assessment Active Problems ICD-10 E11.621 - Type 2 diabetes mellitus with foot ulcer E11.51 - Type 2 diabetes mellitus with diabetic peripheral angiopathy without gangrene E11.40 - Type 2 diabetes mellitus with diabetic neuropathy, unspecified L97.423 - Non-pressure chronic ulcer of left heel and midfoot with  necrosis of muscle I87.322 - 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 L97.221 - Non-pressure chronic ulcer of left calf limited to breakdown of skin Bosler, [name] B. ([id number]) L03.116 - Cellulitis of left lower limb Procedures Wound #2 Wound #2 is a Diabetic Wound/Ulcer of the Lower Extremity located on the Left,Lateral Calcaneus . There was a Skin/Subcutaneous Tissue Debridement [id number]) debridement with total area of 6.24 sq cm performed by Ricard Dillon, MD. with the following instrument(s): Curette to remove Viable and Non-Viable tissue/material including Fibrin/Slough, Muscle, Eschar, and Subcutaneous after achieving pain control using Lidocaine 4% Topical Solution. A time out was conducted at 13:17, prior to  the start of the procedure. A Minimum amount of bleeding was controlled with Pressure. The procedure was tolerated well with a pain level of 0 throughout and a pain level of 0 following the procedure. Post Debridement Measurements: 2.6cm length x 2.4cm width x 0.4cm depth; 1.96cm^3 volume. Character of Wound/Ulcer Post Debridement is improved. Severity of Tissue Post Debridement is: Necrosis of muscle. Post procedure Diagnosis Wound #2: Same as Pre-Procedure Plan Wound Cleansing: Wound #2 Left,Lateral Calcaneus: Clean wound with Normal Saline. Anesthetic: Wound #2 Left,Lateral Calcaneus: Topical Lidocaine 4% cream applied to wound bed prior to debridement Primary Wound Dressing: Wound #2 Left,Lateral Calcaneus: Santyl Ointment Secondary Dressing: Wound #2 Left,Lateral Calcaneus: ABD pad Dressing Change Frequency: Wound #2 Left,Lateral Calcaneus: Other: - twice weekly - home nurse to change wrap when she visits (may change more than twice weekly if nurse visits more often) Follow-up Appointments: Wound #2 Left,Lateral Calcaneus: Return Appointment in 1 week. Edema Control: Wound #2 Left,Lateral Calcaneus: Holck,  [name] B. ([id number]) Kerlix and Coban - Left Lower Extremity Off-Loading: Wound #2 Left,Lateral Calcaneus: Other: - open heel shoe Additional Orders / Instructions: Wound #2 Left,Lateral Calcaneus: Increase protein intake. Medications-please add to medication list.: Wound #2 Left,Lateral Calcaneus: Santyl Enzymatic Ointment continue santyl for now. oiodoflex at some point. Will definately need a skin sub when the base will support granulation Electronic Signature(s) Signed: 09/11/2016 5:08:44 PM By: Linton Ham MD Entered By: Linton Ham on 09/11/2016 14:21:38 Preyer, [name] ([id number]) -------------------------------------------------------------------------------- SuperBill Details Patient Name: [name] Date of Service: 09/11/2016 Medical Record Patient Account Number: [id number] Number: Treating RN: Montey Hora April 23, 1941 (76 y.o. 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 I87.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 L97.221 Non-pressure chronic ulcer of left calf limited to breakdown of skin L03.116 Cellulitis of left lower limb Facility Procedures CPT4 Code Description: [id number] - DEB SUBQ TISSUE 20 SQ CM/< ICD-10 Description Diagnosis E11.621 Type 2 diabetes mellitus with foot ulcer L97.423 Non-pressure chronic ulcer of left heel and midfoot Modifier: with necrosis Quantity: 1 of muscle Physician  Procedures CPT4 Code Description: [id number] - WC PHYS SUBQ TISS 20 SQ CM ICD-10 Description Diagnosis E11.621 Type 2 diabetes mellitus with foot ulcer L97.423 Non-pressure chronic ulcer of left heel and midfoot Modifier: with necrosis Quantity: 1 of muscle Electronic Signature(s) Signed: 09/11/2016 5:08:44 PM By: Linton Ham MD Entered By: Linton Ham on 09/11/2016 14:22:26

## 2016-09-13 ENCOUNTER — Other Ambulatory Visit: Payer: Self-pay | Admitting: *Deleted

## 2016-09-13 NOTE — Progress Notes (Signed)
Attending weekly wound clinic visits.

## 2016-09-13 NOTE — Patient Outreach (Signed)
Telephone assessment. Pt continuing to see Dr. Dellia Nims for her infected heal wound and weekly assessment and tx. She denies any edema at this time since she has been wrapping her leg and elevating it more than previously. She is to make an appt to see Dr. Drema Dallas. She will make her own transportation arrangements.  I have encouraged her to call me if she has a problem or questions.  I will call her next week.  [name] [facility] [phone]

## 2016-09-18 ENCOUNTER — Encounter: Payer: PPO | Attending: Internal Medicine | Admitting: Internal Medicine

## 2016-09-18 DIAGNOSIS — L97221 Non-pressure chronic ulcer of left calf limited to breakdown of skin: Secondary | ICD-10-CM | POA: Insufficient documentation

## 2016-09-18 DIAGNOSIS — I87332 Chronic venous hypertension (idiopathic) with ulcer and inflammation of left lower extremity: Secondary | ICD-10-CM | POA: Diagnosis not present

## 2016-09-18 DIAGNOSIS — E11621 Type 2 diabetes mellitus with foot ulcer: Secondary | ICD-10-CM | POA: Diagnosis not present

## 2016-09-18 DIAGNOSIS — E1122 Type 2 diabetes mellitus with diabetic chronic kidney disease: Secondary | ICD-10-CM | POA: Diagnosis not present

## 2016-09-18 DIAGNOSIS — Z7901 Long term (current) use of anticoagulants: Secondary | ICD-10-CM | POA: Diagnosis not present

## 2016-09-18 DIAGNOSIS — Z8551 Personal history of malignant neoplasm of bladder: Secondary | ICD-10-CM | POA: Diagnosis not present

## 2016-09-18 DIAGNOSIS — Z87891 Personal history of nicotine dependence: Secondary | ICD-10-CM | POA: Diagnosis not present

## 2016-09-18 DIAGNOSIS — E785 Hyperlipidemia, unspecified: Secondary | ICD-10-CM | POA: Diagnosis not present

## 2016-09-18 DIAGNOSIS — E1140 Type 2 diabetes mellitus with diabetic neuropathy, unspecified: Secondary | ICD-10-CM | POA: Insufficient documentation

## 2016-09-18 DIAGNOSIS — I252 Old myocardial infarction: Secondary | ICD-10-CM | POA: Diagnosis not present

## 2016-09-18 DIAGNOSIS — N183 Chronic kidney disease, stage 3 (moderate): Secondary | ICD-10-CM | POA: Diagnosis not present

## 2016-09-18 DIAGNOSIS — I4891 Unspecified atrial fibrillation: Secondary | ICD-10-CM | POA: Diagnosis not present

## 2016-09-18 DIAGNOSIS — Z936 Other artificial openings of urinary tract status: Secondary | ICD-10-CM | POA: Insufficient documentation

## 2016-09-18 DIAGNOSIS — E1151 Type 2 diabetes mellitus with diabetic peripheral angiopathy without gangrene: Secondary | ICD-10-CM | POA: Diagnosis not present

## 2016-09-18 DIAGNOSIS — Z992 Dependence on renal dialysis: Secondary | ICD-10-CM | POA: Diagnosis not present

## 2016-09-18 DIAGNOSIS — H3530 Unspecified macular degeneration: Secondary | ICD-10-CM | POA: Insufficient documentation

## 2016-09-18 DIAGNOSIS — I129 Hypertensive chronic kidney disease with stage 1 through stage 4 chronic kidney disease, or unspecified chronic kidney disease: Secondary | ICD-10-CM | POA: Diagnosis not present

## 2016-09-18 DIAGNOSIS — D649 Anemia, unspecified: Secondary | ICD-10-CM | POA: Diagnosis not present

## 2016-09-18 DIAGNOSIS — I2510 Atherosclerotic heart disease of native coronary artery without angina pectoris: Secondary | ICD-10-CM | POA: Diagnosis not present

## 2016-09-18 DIAGNOSIS — M1990 Unspecified osteoarthritis, unspecified site: Secondary | ICD-10-CM | POA: Diagnosis not present

## 2016-09-18 DIAGNOSIS — Z794 Long term (current) use of insulin: Secondary | ICD-10-CM | POA: Insufficient documentation

## 2016-09-18 DIAGNOSIS — L97423 Non-pressure chronic ulcer of left heel and midfoot with necrosis of muscle: Secondary | ICD-10-CM | POA: Diagnosis not present

## 2016-09-18 DIAGNOSIS — L03116 Cellulitis of left lower limb: Secondary | ICD-10-CM | POA: Insufficient documentation

## 2016-09-18 DIAGNOSIS — Z89511 Acquired absence of right leg below knee: Secondary | ICD-10-CM | POA: Insufficient documentation

## 2016-09-19 NOTE — Progress Notes (Signed)
[name] ([id number]) Visit Report for 09/18/2016 Chief Complaint Document Details Patient Name: [name], [name] Date of Service: 09/18/2016 3:30 PM Medical Record Patient Account Number: [id number] Number: Treating RN: [name] 18-Mar-1941 (76 y.o. Other Clinician: Date of Birth/Sex: Female) Treating [name] Primary Care Provider: Leighton [name] Provider/Extender: G Referring Provider: Bethanie [name] in Treatment: 4 Information Obtained from: Patient Chief Complaint Patient is here for review of a wound on the left heel Electronic Signature(s) Signed: 09/18/2016 4:28:51 PM By: [name] Ham MD Entered By: [name] on 09/18/2016 [name] ([id number]) -------------------------------------------------------------------------------- Debridement Details Patient Name: [name] Date of Service: 09/18/2016 3:30 PM Medical Record Patient Account Number: [id number] Number: Treating RN: [name] 02-13-1941 (76 y.o. Other Clinician: Date of Birth/Sex: Female) Treating [place], [place] Primary Care Provider: Leighton [name] Provider/Extender: G Referring Provider: Bethanie [name] in Treatment: 4 Debridement Performed for Wound #2 Left,Lateral Calcaneus Assessment: Performed By: Physician [name] Dillon, MD Debridement: Debridement Pre-procedure Yes - 15:55 Verification/Time Out Taken: Start Time: 15:55 Pain Control: Lidocaine 4% Topical Solution Level: Skin/Subcutaneous Tissue Total Area Debrided (L x 2.4 (cm) x 2.2 (cm) = 5.28 (cm) W): Tissue and other Viable, Non-Viable, Fibrin/Slough, Muscle, Subcutaneous material debrided: Instrument: Curette Bleeding: Moderate Hemostasis Achieved: Pressure End Time: 15:59 Procedural Pain: 0 Post Procedural Pain: 0 Response to Treatment: Procedure was tolerated well Post Debridement Measurements of Total Wound Length: (cm)  2.4 Width: (cm) 2.2 Depth: (cm) 0.4 Volume: (cm) 1.659 Character of Wound/Ulcer Post Improved Debridement: Severity of Tissue Post Debridement: Fat layer exposed Post Procedure Diagnosis Same as Pre-procedure Electronic Signature(s) Signed: 09/18/2016 4:28:51 PM By: [name] Ham MD Signed: 09/18/2016 5:09:18 PM By: [name], Bea [name] ([id number]) Entered By: [name] on 09/18/2016 16:10:59 [name] ([id number]) -------------------------------------------------------------------------------- HPI Details Patient Name: [name] Date of Service: 09/18/2016 3:30 PM Medical Record Patient Account Number: [id number] Number: Treating RN: [name] [birth year] (76 y.o. Other Clinician: Date of Birth/Sex: Female) Treating [name] Primary Care Provider: Leighton [name] Provider/Extender: G Referring Provider: Bethanie [name] in Treatment: 4 History of Present Illness HPI Description: 08/21/16; [name] is a 76 year old woman who I cared for in our [facility] clinic up until January 2016. At that point she had a wound in her left great toe. 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 He follows with vein and vascular and has a follow-up appointment in early February. She is status left femoral to anterior tibial bypass in February 2016. She is also noted to have venous stasis changes and a history of ulcers. 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 fib on Eliquis. She has bladder cancer and has a chronic urostomy. The patient tells me that 2 weeks ago she had a right prosthesis and a new left diabetic shoe. Roughly one week  ago she noted a wound on her left lateral heel. She zinc oxide and some silver alginate she had left over until she could be seen here. It was also noted that she has had blistering on her posterior left calf and an open area here as well. Her records note that she has chronic venous insufficiency  with chronic inflammation especially in the left mid calf area. The patient has not been systemically unwell no fever or chills. She is reasonably insensate and has not felt any pain 09/04/16; the patient has a fairly substantial wound on the left lateral calcaneus. We have been using Santyl to this area. Blister on the left lower leg appears to of healed the patient also follows with [name] early of vein and vascular. He felt that she should have enough blood flow to heal her left heel wound. He renewed her doxycycline for a further week. She is status post left femoral to anterior tibial bypass in February 2016. 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 She has a remote history of a right BKA. The XRAY that I ordered last week showed no definite evidence of osteomyelitis however a small focus of demineralization was noted in the plantar aspect of the calcaneus which might warrant an MRI possibly indicating early osteomyelitis however this is not in the area of her wound and I'm going to keep this under advisement for now. 09/11/16; still a substantial wound in the left lateral calcaneus we've been using Santyl 09/18/16; still a substantial wound on the left lateral calcaneus. We are still using Environmental health practitioner) Signed: 09/18/2016 4:28:51 PM By: [name] Ham MD [name] ([id number]) Entered By: [name] on 09/18/2016 16:12:04 [name] ([id number]) -------------------------------------------------------------------------------- Physical Exam Details Patient Name: [name] Date of Service: 09/18/2016 3:30 PM Medical Record Patient Account Number:  [id number] Number: Treating RN: [name] 06-07-1941 (76 y.o. Other Clinician: Date of Birth/Sex: Female) Treating [name] Primary Care Provider: Leighton [name] Provider/Extender: G Referring Provider: Bethanie [name] in Treatment: 4 Constitutional Patient is hypertensive.. Pulse regular and within target range for patient.Marland Kitchen Respirations regular, non-labored and within target range.. Temperature is normal and within the target range for the patient.. Patient's appearance is neat and clean. Appears in no acute distress. Well nourished and well developed.. Cardiovascular Reduced dorsalis pedis pulse. Notes Wound exam; the plantar heel continues to have a fairly substantial wound on the lateral aspect of the heel. This is not a weight-bearing surface. Using a #5 curet I debrided copious amounts of necrotic material afterwords there is viable tissue seen. Also a fairly brisk amount of bleeding lending support to Dr. Marrion Coy assumption of adequate blood flow. The worrisome thing here is there is not much tissue over the heel. There is no evidence of surrounding infection no redness no erythema and no drainage. Electronic Signature(s) Signed: 09/18/2016 4:28:51 PM By: [name] Ham MD Entered By: [name] on 09/18/2016 16:13:46 Lembcke, Bea [name] ([id number]) -------------------------------------------------------------------------------- Physician Orders Details Patient Name: [name] Date of Service: 09/18/2016 3:30 PM Medical Record Patient Account Number: [id number] Number: Treating RN: [name] July 31, 1941 (76 y.o. Other Clinician: Date of Birth/Sex: Female) Treating [name] Primary Care Provider: Leighton [name] Provider/Extender: G Referring Provider: Bethanie [name] in Treatment: 4 Verbal / Phone Orders: Yes Clinician: Montey [name] Read Back and Verified: Yes Diagnosis Coding Wound  Cleansing Wound #2 Left,Lateral Calcaneus o Clean wound with Normal Saline. Anesthetic Wound #2 Left,Lateral Calcaneus o Topical Lidocaine 4% cream applied to wound bed prior to debridement Primary Wound Dressing Wound #2 Left,Lateral Calcaneus o Santyl Ointment Secondary Dressing Wound #2 Left,Lateral Calcaneus o ABD pad Dressing Change Frequency Wound #2 Left,Lateral Calcaneus o Other: - twice weekly - home nurse to change wrap when she visits (may change more than twice weekly if nurse visits  more often) Follow-up Appointments Wound #2 Left,Lateral Calcaneus o Return Appointment in 1 week. Edema Control Wound #2 Left,Lateral Calcaneus o Kerlix and Coban - Left Lower Extremity Off-Loading Wound #2 Left,Lateral Calcaneus o Other: - open heel shoe Hinze, [name] B. ([id number]) Additional Orders / Instructions Wound #2 Left,Lateral Calcaneus o Increase protein intake. Medications-please add to medication list. Wound #2 Left,Lateral Calcaneus o Santyl Enzymatic Ointment Electronic Signature(s) Signed: 09/18/2016 3:52:00 PM By: [name] Signed: 09/18/2016 4:28:51 PM By: [name] Ham MD Entered By: [name] on 09/18/2016 15:52:00 Cristo, Bea [name] ([id number]) -------------------------------------------------------------------------------- Problem List Details Patient Name: [name] Date of Service: 09/18/2016 3:30 PM Medical Record Patient Account Number: [id number] Number: Treating RN: [name] [birth year] (76 y.o. 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/21/2016 Yes necrosis of muscle I87.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 hypertension (idiopathic) with 08/21/2016 Yes inflammation of left lower extremity L97.221 Non-pressure chronic ulcer of left calf limited to 08/21/2016 Yes breakdown of skin L03.116 Cellulitis of left lower limb 08/21/2016 Yes Inactive Problems Resolved Problems Electronic Signature(s) NATAJHA, PINTADO ([id number]) Signed: 09/18/2016 4:28:51 PM By: [name] Ham MD Entered By: [name] on 09/18/2016 16:10:30 Payer, Bea [name] ([id number]) -------------------------------------------------------------------------------- Progress Note Details Patient Name: [name] Date of Service: 09/18/2016 3:30 PM Medical Record Patient Account Number: [id number] Number: Treating RN: [name] 01-08-1941 (76 y.o. Other Clinician: Date of Birth/Sex: Female) Treating [name] Primary Care Provider: Leighton [name] Provider/Extender: G Referring Provider: Bethanie [name] in Treatment: 4 Subjective Chief Complaint Information obtained from Patient Patient is here for review of a wound on the left heel History of Present Illness (HPI) 08/21/16; [name] is a 76 year old woman who I cared for in our [facility] clinic up until January 2016. At that point she had a wound in her left great toe. 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 He follows with vein and vascular and has a follow-up appointment in early February. She is status left femoral to anterior tibial bypass in February 2016. She is also noted to have venous stasis changes and a history of ulcers.  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 fib on Eliquis. She has bladder cancer and has a chronic urostomy. The patient tells me that 2 weeks ago she had a right prosthesis and a new left diabetic shoe. Roughly one week ago she noted a wound on her left lateral heel. She zinc oxide and some silver alginate she had left over until she could be seen here. It was also noted that she has had blistering on her posterior left calf and an open area here as well. Her records note that she has chronic venous insufficiency with chronic inflammation especially in the left mid calf area. The patient has not been systemically unwell  no fever or chills. She is reasonably insensate and has not felt any pain 09/04/16; the patient has a fairly substantial wound on the left lateral calcaneus. We have been using Santyl to this area. Blister on the left lower leg appears to of healed the patient also follows with [name] early of vein and vascular. He felt that she should have enough blood flow to heal her left heel wound. He renewed her doxycycline for a further week. She is status post left femoral to anterior tibial bypass in February 2016. 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 She has a remote history of a right BKA. The XRAY that I ordered last week showed no definite evidence of osteomyelitis however a small focus of demineralization was noted in the plantar aspect of the calcaneus which might warrant an MRI possibly indicating early osteomyelitis however this is not in the area of her wound and I'm going to keep this under advisement for now. 09/11/16; still a substantial wound in the left lateral calcaneus we've been using Santyl Poon, [name] B. ([id number]) 09/18/16; still a substantial wound on the left lateral calcaneus. We are still using  Santyl Objective Constitutional Patient is hypertensive.. Pulse regular and within target range for patient.Marland Kitchen Respirations regular, non-labored and within target range.. Temperature is normal and within the target range for the patient.. Patient's appearance is neat and clean. Appears in no acute distress. Well nourished and well developed.. Vitals Time Taken: 3:31 PM, Height: 63 in, Weight: 205 lbs, BMI: 36.3, Temperature: 97.8 F, Pulse: 58 bpm, Respiratory Rate: 16 breaths/min, Blood Pressure: 157/46 mmHg. Cardiovascular Reduced dorsalis pedis pulse. General Notes: Wound exam; the plantar heel continues to have a fairly substantial wound on the lateral aspect of the heel. This is not a weight-bearing surface. Using a #5 curet I debrided copious amounts of necrotic material afterwords there is viable tissue seen. Also a fairly brisk amount of bleeding lending support to Dr. Marrion Coy assumption of adequate blood flow. The worrisome thing here is there is not much tissue over the heel. There is no evidence of surrounding infection no redness no erythema and no drainage. Integumentary (Hair, Skin) Wound #2 status is Open. Original cause of wound was Gradually Appeared. The wound is located on the Left,Lateral Calcaneus. The wound measures 2.4cm length x 2.2cm width x 0.4cm depth; 4.147cm^2 area and 1.659cm^3 volume. The wound is limited to skin breakdown. There is no tunneling or undermining noted. There is a large amount of serous drainage noted. The wound margin is flat and intact. There is small (1-33%) red granulation within the wound bed. There is a large (67-100%) amount of necrotic tissue within the wound bed including Eschar and Adherent Slough. The periwound skin appearance exhibited: Maceration. The periwound skin appearance did not exhibit: Callus, Crepitus, Excoriation, Induration, Rash, Scarring, Dry/Scaly, Atrophie Blanche, Cyanosis, Ecchymosis, Hemosiderin Staining, Mottled,  Pallor, Rubor, Erythema. Periwound temperature was noted as No Abnormality. The periwound has tenderness on palpation. Assessment Active Problems ICD-10 E11.621 - Type 2 diabetes mellitus with foot ulcer Aguinaldo, [name] B. 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 There was a Skin/Subcutaneous Tissue Debridement [id number]) debridement with total area of 5.28 sq cm performed by [name] Dillon, MD. with the following instrument(s): Curette to remove Viable and Non-Viable tissue/material including Fibrin/Slough, Muscle, and Subcutaneous after achieving pain control using Lidocaine 4% Topical Solution. A time out was conducted at 15:55, prior to the start of the procedure. A Moderate amount of bleeding was controlled with Pressure. The procedure was tolerated well with a pain level of 0 throughout and a pain level of 0 following the procedure. Post Debridement Measurements: 2.4cm length x 2.2cm width x 0.4cm depth; 1.659cm^3 volume. Character of Wound/Ulcer Post Debridement is improved. Severity of Tissue Post Debridement is: Fat layer exposed. Post procedure Diagnosis Wound #2: Same as Pre-Procedure Plan Wound Cleansing: Wound #2 Left,Lateral Calcaneus: Clean wound with Normal Saline. Anesthetic: Wound #2 Left,Lateral Calcaneus: Topical Lidocaine 4% cream applied to wound bed prior to debridement Primary Wound Dressing: Wound #2 Left,Lateral  Calcaneus: Santyl Ointment Secondary Dressing: Wound #2 Left,Lateral Calcaneus: ABD pad Dressing Change Frequency: Wound #2 Left,Lateral Calcaneus: Other: - twice weekly - home nurse to change wrap when she visits (may change more than twice weekly if nurse visits more often) Pinho, Tyshea B. ([id number]) Follow-up Appointments: Wound #2 Left,Lateral Calcaneus: Return Appointment in 1 week. Edema Control: Wound #2 Left,Lateral Calcaneus: Kerlix and Coban - Left Lower Extremity Off-Loading: Wound #2 Left,Lateral Calcaneus: Other: - open heel shoe Additional Orders / Instructions: Wound #2 Left,Lateral Calcaneus: Increase protein intake. Medications-please add to medication list.: Wound #2 Left,Lateral Calcaneus: Santyl Enzymatic Ointment #1 this is a reasonably deep wound which comes precariously close to the lateral aspect of this patient's calcaneus. #2 I am going to continue the Santyl based dressings for one perhaps 2 weeks. #3 likely Prisma after the Santyl. #4 likelihood of an advanced treatment option here is high likely Dermagraft #5 patient was educated in [place], expresses understanding #6 continue with current care. I am holding the MRI and reserved at this point although it is possible she'll need one of these as well Electronic Signature(s) Signed: 09/18/2016 4:28:51 PM By: [name] Ham MD Entered By: [name] on 09/18/2016 16:16:06 Babineau, Bea [name] ([id number]) -------------------------------------------------------------------------------- SuperBill Details Patient Name: [name] Date of Service: 09/18/2016 Medical Record Patient Account Number: [id number] Number: Treating RN: [name] [birth year] (76 y.o. 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 I87.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 L97.221 Non-pressure chronic ulcer of left calf limited to breakdown of skin L03.116 Cellulitis of left lower limb Facility Procedures CPT4 Code Description: [id number] - DEB MUSC/FASCIA 20 SQ CM/< ICD-10 Description Diagnosis E11.621 Type 2 diabetes mellitus with foot ulcer L97.423 Non-pressure chronic ulcer of left heel and midfoot Modifier: with necrosis Quantity: 1 of muscle Physician Procedures CPT4 Code Description: [id number] - WC PHYS DEBR MUSCLE/FASCIA 20 SQ CM ICD-10 Description Diagnosis E11.621 Type 2 diabetes mellitus with foot ulcer L97.423 Non-pressure chronic ulcer of left heel and midfoot wit Modifier: h necrosis Quantity: 1 of muscle Electronic Signature(s) Signed: 09/18/2016  4:28:51 PM By: [name] Ham MD Entered By: [name] on 09/18/2016 16:16:39

## 2016-09-19 NOTE — Progress Notes (Signed)
DEBANHI, VANSKIVER ([id number]) Visit Report for 09/18/2016 Arrival Information Details Patient Name: [name], [name] Date of Service: 09/18/2016 3:30 PM Medical Record Number: [id number] Patient Account Number: [id number] Date of Birth/Sex: [birth year] (76 y.o. Female) Treating RN: Montey Hora Primary Care Basma Buchner: Leighton Ruff Other Clinician: Referring Tenley Winward: Leighton Ruff Treating Naileah Karg/Extender: Tito Dine in Treatment: 4 Visit Information History Since Last Visit Added or deleted any medications: No Patient Arrived: Wheel Chair Any new allergies or adverse reactions: No Arrival Time: 15:30 Had a fall or experienced change in No Accompanied By: friend activities of daily living that may affect Transfer Assistance: None risk of falls: Patient Identification Verified: Yes Signs or symptoms of abuse/neglect since last No Secondary Verification Process Yes visito Completed: Hospitalized since last visit: No Patient Has Alerts: Yes Has Dressing in Place as Prescribed: Yes Patient Alerts: Patient on Blood Has Compression in Place as Prescribed: Yes Thinner Pain Present Now: No eliquis DMII ABI [place] >220 Electronic Signature(s) Signed: 09/18/2016 5:09:18 PM By: Montey Hora Entered By: Montey Hora on 09/18/2016 15:31:33 Mccarroll, [name] ([id number]) -------------------------------------------------------------------------------- Encounter Discharge Information Details Patient Name: [name] Date of Service: 09/18/2016 3:30 PM Medical Record Number: [id number] Patient Account Number: [id number] Date of Birth/Sex: October 04, 1940 (76 y.o. Female) Treating RN: Montey Hora Primary Care Jemya Depierro: Leighton Ruff Other Clinician: Referring Enora Trillo: Leighton Ruff Treating Eastyn Skalla/Extender: Tito Dine in Treatment: 4 Encounter Discharge Information Items Discharge Pain Level: 0 Discharge Condition: Stable Ambulatory  Status: Wheelchair Discharge Destination: Home Transportation: Private Auto Accompanied By: friend Schedule Follow-up Appointment: Yes Medication Reconciliation completed No and provided to Patient/Care Rikki Smestad: Provided on Clinical Summary of Care: 09/18/2016 Form Type Recipient Paper Patient BI Electronic Signature(s) Signed: 09/18/2016 4:20:04 PM By: Ruthine Dose Previous Signature: 09/18/2016 3:51:08 PM Version By: Montey Hora Entered By: Ruthine Dose on 09/18/2016 16:20:03 [name] ([id number]) -------------------------------------------------------------------------------- Lower Extremity Assessment Details Patient Name: [name] Date of Service: 09/18/2016 3:30 PM Medical Record Number: [id number] Patient Account Number: [id number] Date of Birth/Sex: 1941-06-16 (76 y.o. Female) Treating RN: Montey Hora Primary Care Kiarah Eckstein: Leighton Ruff Other Clinician: Referring Delyle Weider: Leighton Ruff Treating Ryun Velez/Extender: Tito Dine in Treatment: 4 Edema Assessment Assessed: [Left: No] [Right: No] E[Left: dema] [Right: :] Calf Left: Right: Point of Measurement: 32 cm From Medial Instep 36.3 cm cm Ankle Left: Right: Point of Measurement: 12 cm From Medial Instep 26.4 cm cm Vascular Assessment Pulses: Dorsalis Pedis Palpable: [Left:Yes] Posterior Tibial Extremity colors, hair growth, and conditions: Extremity Color: [Left:Hyperpigmented] Hair Growth on Extremity: [Left:No] Temperature of Extremity: [Left:Warm] Capillary Refill: [Left:< 3 seconds] Electronic Signature(s) Signed: 09/18/2016 5:09:18 PM By: Montey Hora Entered By: Montey Hora on 09/18/2016 15:40:23 Bostwick, [name] ([id number]) -------------------------------------------------------------------------------- Multi Wound Chart Details Patient Name: [name] Date of Service: 09/18/2016 3:30 PM Medical Record Number: [id number] Patient Account Number:  [id number] Date of Birth/Sex: 01-Jul-1941 (76 y.o. Female) Treating RN: Montey Hora Primary Care Johnsie Moscoso: Leighton Ruff Other Clinician: Referring Asuncion Tapscott: Leighton Ruff Treating Haylen Bellotti/Extender: Tito Dine in Treatment: 4 Vital Signs Height(in): 63 Pulse(bpm): 58 Weight(lbs): 205 Blood Pressure 157/46 (mmHg): Body Mass Index(BMI): 36 Temperature(F): 97.8 Respiratory Rate 16 (breaths/min): Photos: [N/A:N/A] Wound Location: Left Calcaneus - Lateral N/A N/A Wounding Event: Gradually Appeared N/A N/A Primary Etiology: Diabetic Wound/Ulcer of N/A N/A the Lower Extremity Comorbid History: Anemia, Coronary Artery N/A N/A Disease, Hypertension, Myocardial Infarction, Type II Diabetes, Osteoarthritis, Neuropathy Date Acquired: 08/12/2016 N/A N/A Weeks of Treatment: 4 N/A N/A Wound Status:  Open N/A N/A Measurements L x W x D 2.4x2.2x0.4 N/A N/A (cm) Area (cm) : 4.147 N/A N/A Volume (cm) : 1.659 N/A N/A % Reduction in Area: 42.90% N/A N/A % Reduction in Volume: -14.30% N/A N/A Classification: Grade 1 N/A N/A Exudate Amount: Large N/A N/A Exudate Type: Serous N/A N/A Exudate Color: amber N/A N/A Yes N/A N/A Penza, Lavonda B. ([id number]) Foul Odor After Cleansing: Odor Anticipated Due to No N/A N/A Product Use: Wound Margin: Flat and Intact N/A N/A Granulation Amount: Small (1-33%) N/A N/A Granulation Quality: Red N/A N/A Necrotic Amount: Large (67-100%) N/A N/A Necrotic Tissue: Eschar, Adherent Slough N/A N/A Exposed Structures: Fascia: No N/A N/A Fat Layer (Subcutaneous Tissue) Exposed: No Tendon: No Muscle: No Joint: No Bone: No Limited to Skin Breakdown Epithelialization: None N/A N/A Debridement: Debridement [id number] N/A N/A [id number]) Pre-procedure 15:55 N/A N/A Verification/Time Out Taken: Pain Control: Lidocaine 4% Topical N/A N/A Solution Tissue Debrided: Fibrin/Slough, N/A N/A Subcutaneous Level: Skin/Subcutaneous N/A  N/A Tissue Debridement Area (sq 5.28 N/A N/A cm): Instrument: Curette N/A N/A Bleeding: Moderate N/A N/A Hemostasis Achieved: Pressure N/A N/A Procedural Pain: 0 N/A N/A Post Procedural Pain: 0 N/A N/A Debridement Treatment Procedure was tolerated N/A N/A Response: well Post Debridement 2.4x2.2x0.4 N/A N/A Measurements L x W x D (cm) Post Debridement 1.659 N/A N/A Volume: (cm) Periwound Skin Texture: Excoriation: No N/A N/A Induration: No Callus: No Crepitus: No Rash: No Scarring: No Reh, Kayelynn B. ([id number]) Periwound Skin Maceration: Yes N/A N/A Moisture: Dry/Scaly: No Periwound Skin Color: Atrophie Blanche: No N/A N/A Cyanosis: No Ecchymosis: No Erythema: No Hemosiderin Staining: No Mottled: No Pallor: No Rubor: No Temperature: No Abnormality N/A N/A Tenderness on Yes N/A N/A Palpation: Wound Preparation: Ulcer Cleansing: N/A N/A Rinsed/Irrigated with Saline Topical Anesthetic Applied: Other: lidocaine 4% Procedures Performed: Debridement N/A N/A Treatment Notes Wound #2 (Left, Lateral Calcaneus) 1. Cleansed with: Clean wound with Normal Saline 2. Anesthetic Topical Lidocaine 4% cream to wound bed prior to debridement 4. Dressing Applied: Santyl Ointment 5. Secondary Dressing Applied ABD Pad Dry Gauze 7. Secured with Other (specify in notes) Notes kerlix and coban wrap Electronic Signature(s) Signed: 09/18/2016 4:28:51 PM By: Linton Ham MD Entered By: Linton Ham on 09/18/2016 16:10:39 Demasi, [name] ([id number]) -------------------------------------------------------------------------------- Multi-Disciplinary Care Plan Details Patient Name: [name] Date of Service: 09/18/2016 3:30 PM Medical Record Number: [id number] Patient Account Number: [id number] Date of Birth/Sex: [birth year] (76 y.o. Female) Treating RN: Montey Hora Primary Care Geremiah Fussell: Leighton Ruff Other Clinician: Referring Kathlene Yano: Leighton Ruff Treating Iyanni Hepp/Extender: Tito Dine in Treatment: 4 Active Inactive ` Abuse / Safety / Falls / Self Care Management Nursing Diagnoses: Potential for falls Goals: Patient will remain injury free Date Initiated: 08/21/2016 Target Resolution Date: 10/08/2016 Goal Status: Active Interventions: Assess fall risk on admission and as needed Notes: ` Necrotic Tissue Nursing Diagnoses: Impaired tissue integrity related to necrotic/devitalized tissue Goals: Necrotic/devitalized tissue will be minimized in the wound bed Date Initiated: 08/21/2016 Target Resolution Date: 10/08/2016 Goal Status: Active Interventions: Assess patient pain level pre-, during and post procedure and prior to discharge Notes: ` Orientation to the Wound Care Program Nursing Diagnoses: Knowledge deficit related to the wound healing center program KERLINE, MOREA ([id number]) Goals: Patient/caregiver will verbalize understanding of the [place] Date Initiated: 08/21/2016 Target Resolution Date: 10/08/2016 Goal Status: Active Interventions: Provide education on orientation to the wound center Notes: ` Peripheral Neuropathy Nursing Diagnoses: Potential alteration in peripheral tissue perfusion (select prior to  confirmation of diagnosis) Goals: Patient/caregiver will verbalize understanding of disease process and disease management Date Initiated: 08/21/2016 Target Resolution Date: 10/08/2016 Goal Status: Active Interventions: Assess signs and symptoms of neuropathy upon admission and as needed Notes: ` Venous Leg Ulcer Nursing Diagnoses: Actual venous Insuffiency (use after diagnosis is confirmed) Goals: Patient will maintain optimal edema control Date Initiated: 08/21/2016 Target Resolution Date: 10/08/2016 Goal Status: Active Interventions: Assess peripheral edema status every visit. Notes: ` Wound/Skin Impairment Nursing Diagnoses: Impaired tissue  integrity Caples, Heidie B. ([id number]) Goals: Patient/caregiver will verbalize understanding of skin care regimen Date Initiated: 08/21/2016 Target Resolution Date: 10/08/2016 Goal Status: Active Ulcer/skin breakdown will have a volume reduction of 30% by week 4 Date Initiated: 08/21/2016 Target Resolution Date: 10/08/2016 Goal Status: Active Ulcer/skin breakdown will have a volume reduction of 50% by week 8 Date Initiated: 08/21/2016 Target Resolution Date: 10/29/2016 Goal Status: Active Ulcer/skin breakdown will have a volume reduction of 80% by week 12 Date Initiated: 08/21/2016 Target Resolution Date: 11/12/2016 Goal Status: Active Ulcer/skin breakdown will heal within 14 weeks Date Initiated: 08/21/2016 Target Resolution Date: 11/12/2016 Goal Status: Active Interventions: Assess patient/caregiver ability to obtain necessary supplies Assess patient/caregiver ability to perform ulcer/skin care regimen upon admission and as needed Assess ulceration(s) every visit Notes: Electronic Signature(s) Signed: 09/18/2016 5:09:18 PM By: Montey Hora Entered By: Montey Hora on 09/18/2016 15:40:27 Gabler, [name] ([id number]) -------------------------------------------------------------------------------- Pain Assessment Details Patient Name: [name] Date of Service: 09/18/2016 3:30 PM Medical Record Number: [id number] Patient Account Number: [id number] Date of Birth/Sex: 1941/04/21 (76 y.o. Female) Treating RN: Montey Hora Primary Care Alianis Trimmer: Leighton Ruff Other Clinician: Referring Allexa Acoff: Leighton Ruff Treating Jendaya Gossett/Extender: Tito Dine in Treatment: 4 Active Problems Location of Pain Severity and Description of Pain Patient Has Paino No Site Locations Pain Management and Medication Current Pain Management: Notes Topical or injectable lidocaine is offered to patient for acute pain when surgical debridement is performed. If needed, Patient  is instructed to use over the counter pain medication for the following 24-48 hours after debridement. Wound care MDs do not prescribed pain medications. Patient has chronic pain or uncontrolled pain. Patient has been instructed to make an appointment with their Primary Care Physician for pain management. Electronic Signature(s) Signed: 09/18/2016 5:09:18 PM By: Montey Hora Entered By: Montey Hora on 09/18/2016 15:31:41 Reuss, [name] ([id number]) -------------------------------------------------------------------------------- Patient/Caregiver Education Details Patient Name: [name] Date of Service: 09/18/2016 3:30 PM Medical Record Patient Account Number: [id number] Number: Treating RN: Montey Hora 1941-06-22 (76 y.o. Other Clinician: Date of Birth/Gender: Female) Treating ROBSON, MICHAEL Primary Care Physician: Leighton Ruff Physician/Extender: G Referring Physician: Bethanie Dicker in Treatment: 4 Education Assessment Education Provided To: Patient and Caregiver Education Topics Provided Venous: Handouts: Other: leg elevation Methods: Explain/Verbal Responses: State content correctly Electronic Signature(s) Signed: 09/18/2016 5:09:18 PM By: Montey Hora Entered By: Montey Hora on 09/18/2016 15:51:24 Glore, [name] ([id number]) -------------------------------------------------------------------------------- Wound Assessment Details Patient Name: [name] Date of Service: 09/18/2016 3:30 PM Medical Record Number: [id number] Patient Account Number: [id number] Date of Birth/Sex: Oct 07, 1940 (76 y.o. Female) Treating RN: Montey Hora Primary Care Tarrin Lebow: Leighton Ruff Other Clinician: Referring Kemo Spruce: Leighton Ruff Treating Azilee Pirro/Extender: Tito Dine in Treatment: 4 Wound Status Wound Number: 2 Primary Diabetic Wound/Ulcer of the Lower Etiology: Extremity Wound Location: Left Calcaneus -  Lateral Wound Open Wounding Event: Gradually Appeared Status: Date Acquired: 08/12/2016 Comorbid Anemia, Coronary Artery Disease, Weeks Of Treatment: 4 History: Hypertension, Myocardial Infarction, Clustered Wound: No Type II  Diabetes, Osteoarthritis, Neuropathy Photos Wound Measurements Length: (cm) 2.4 Width: (cm) 2.2 Depth: (cm) 0.4 Area: (cm) 4.147 Volume: (cm) 1.659 % Reduction in Area: 42.9% % Reduction in Volume: -14.3% Epithelialization: None Tunneling: No Undermining: No Wound Description Classification: Grade 1 Foul Odor Afte Wound Margin: Flat and Intact Due to Product Exudate Amount: Large Slough/Fibrino Exudate Type: Serous Exudate Color: amber r Cleansing: Yes Use: No Yes Wound Bed Granulation Amount: Small (1-33%) Exposed Structure Granulation Quality: Red Fascia Exposed: No Necrotic Amount: Large (67-100%) Fat Layer (Subcutaneous Tissue) Exposed: No Necrotic Quality: Eschar, Adherent Slough Tendon Exposed: No Gatta, Malon B. ([id number]) Muscle Exposed: No Joint Exposed: No Bone Exposed: No Limited to Skin Breakdown Periwound Skin Texture Texture Color No Abnormalities Noted: No No Abnormalities Noted: No Callus: No Atrophie Blanche: No Crepitus: No Cyanosis: No Excoriation: No Ecchymosis: No Induration: No Erythema: No Rash: No Hemosiderin Staining: No Scarring: No Mottled: No Pallor: No Moisture Rubor: No No Abnormalities Noted: No Dry / Scaly: No Temperature / Pain Maceration: Yes Temperature: No Abnormality Tenderness on Palpation: Yes Wound Preparation Ulcer Cleansing: Rinsed/Irrigated with Saline Topical Anesthetic Applied: Other: lidocaine 4%, Treatment Notes Wound #2 (Left, Lateral Calcaneus) 1. Cleansed with: Clean wound with Normal Saline 2. Anesthetic Topical Lidocaine 4% cream to wound bed prior to debridement 4. Dressing Applied: Santyl Ointment 5. Secondary Dressing Applied ABD Pad Dry Gauze 7.  Secured with Other (specify in notes) Notes kerlix and coban wrap Electronic Signature(s) Signed: 09/18/2016 5:09:18 PM By: Montey Hora Entered By: Montey Hora on 09/18/2016 15:39:53 Stephani, [name] ([id number]) -------------------------------------------------------------------------------- Vitals Details Patient Name: [name] Date of Service: 09/18/2016 3:30 PM Medical Record Number: [id number] Patient Account Number: [id number] Date of Birth/Sex: [birth year] (76 y.o. Female) Treating RN: Montey Hora Primary Care Louana Fontenot: Leighton Ruff Other Clinician: Referring Mileigh Tilley: Leighton Ruff Treating Sukaina Toothaker/Extender: Tito Dine in Treatment: 4 Vital Signs Time Taken: 15:31 Temperature (F): 97.8 Height (in): 63 Pulse (bpm): 58 Weight (lbs): 205 Respiratory Rate (breaths/min): 16 Body Mass Index (BMI): 36.3 Blood Pressure (mmHg): 157/46 Reference Range: 80 - 120 mg / dl Electronic Signature(s) Signed: 09/18/2016 5:09:18 PM By: Montey Hora Entered By: Montey Hora on 09/18/2016 15:33:15

## 2016-09-20 ENCOUNTER — Encounter (HOSPITAL_COMMUNITY): Payer: PPO

## 2016-09-20 ENCOUNTER — Other Ambulatory Visit (HOSPITAL_COMMUNITY): Payer: PPO

## 2016-09-20 ENCOUNTER — Ambulatory Visit: Payer: PPO | Admitting: Family

## 2016-09-24 ENCOUNTER — Encounter: Payer: PPO | Admitting: Internal Medicine

## 2016-09-24 DIAGNOSIS — E11621 Type 2 diabetes mellitus with foot ulcer: Secondary | ICD-10-CM | POA: Diagnosis not present

## 2016-09-24 DIAGNOSIS — L97423 Non-pressure chronic ulcer of left heel and midfoot with necrosis of muscle: Secondary | ICD-10-CM | POA: Diagnosis not present

## 2016-09-25 NOTE — Progress Notes (Signed)
KHADESIA, WADZINSKI ([id number]) Visit Report for 09/24/2016 Arrival Information Details Patient Name: [name], [name] Date of Service: 09/24/2016 3:30 PM Medical Record Number: [id number] Patient Account Number: [id number] Date of Birth/Sex: 03/04/1941 (76 y.o. Female) Treating RN: Montey Hora Primary Care Tommy Goostree: Leighton Ruff Other Clinician: Referring Nephi Savage: Leighton Ruff Treating Elaura Calix/Extender: Tito Dine in Treatment: 4 Visit Information History Since Last Visit Added or deleted any medications: No Patient Arrived: Wheel Chair Any new allergies or adverse reactions: No Arrival Time: 15:28 Had a fall or experienced change in No Accompanied By: friend activities of daily living that may affect Transfer Assistance: None risk of falls: Patient Identification Verified: Yes Signs or symptoms of abuse/neglect since last No Secondary Verification Process Yes visito Completed: Hospitalized since last visit: No Patient Has Alerts: Yes Has Dressing in Place as Prescribed: Yes Patient Alerts: Patient on Blood Has Compression in Place as Prescribed: Yes Thinner Pain Present Now: No eliquis DMII ABI [place] >220 Electronic Signature(s) Signed: 09/24/2016 4:56:13 PM By: Montey Hora Entered By: Montey Hora on 09/24/2016 15:28:52 [name], [name] ([id number]) -------------------------------------------------------------------------------- Encounter Discharge Information Details Patient Name: [name] Date of Service: 09/24/2016 3:30 PM Medical Record Number: [id number] Patient Account Number: [id number] Date of Birth/Sex: 11/11/1940 (76 y.o. Female) Treating RN: Montey Hora Primary Care Oskar Cretella: Leighton Ruff Other Clinician: Referring Laney Louderback: Leighton Ruff Treating Tareq Dwan/Extender: Tito Dine in Treatment: 4 Encounter Discharge Information Items Discharge Pain Level: 0 Discharge Condition:  Stable Ambulatory Status: Wheelchair Discharge Destination: Home Transportation: Private Auto Accompanied By: friend Schedule Follow-up Appointment: Yes Medication Reconciliation completed and provided to Patient/Care No Kolbe Delmonaco: Provided on Clinical Summary of Care: 09/24/2016 Form Type Recipient Paper Patient BI Electronic Signature(s) Signed: 09/24/2016 4:28:34 PM By: Ruthine Dose Entered By: Ruthine Dose on 09/24/2016 16:28:34 [name], [name] ([id number]) -------------------------------------------------------------------------------- Lower Extremity Assessment Details Patient Name: [name] Date of Service: 09/24/2016 3:30 PM Medical Record Number: [id number] Patient Account Number: [id number] Date of Birth/Sex: Jul 25, 1941 (76 y.o. Female) Treating RN: Montey Hora Primary Care Gisel Vipond: Leighton Ruff Other Clinician: Referring Deaira Leckey: Leighton Ruff Treating Jamin Panther/Extender: Tito Dine in Treatment: 4 Edema Assessment Assessed: [Left: No] [Right: No] Edema: [Left: Ye] [Right: s] Calf Left: Right: Point of Measurement: 32 cm From Medial Instep cm cm Ankle Left: Right: Point of Measurement: 12 cm From Medial Instep cm cm Vascular Assessment Pulses: Dorsalis Pedis Palpable: [Left:Yes] Posterior Tibial Extremity colors, hair growth, and conditions: Extremity Color: [Left:Hyperpigmented] Hair Growth on Extremity: [Left:No] Temperature of Extremity: [Left:Warm] Capillary Refill: [Left:< 3 seconds] Electronic Signature(s) Signed: 09/24/2016 4:56:13 PM By: Montey Hora Entered By: Montey Hora on 09/24/2016 15:41:19 [name], [name] ([id number]) -------------------------------------------------------------------------------- Multi Wound Chart Details Patient Name: [name] Date of Service: 09/24/2016 3:30 PM Medical Record Number: [id number] Patient Account Number: [id number] Date of Birth/Sex: [birth year] (76 y.o.  Female) Treating RN: Montey Hora Primary Care Marcas Bowsher: Leighton Ruff Other Clinician: Referring Janiyla Long: Leighton Ruff Treating Shaelynn Dragos/Extender: Tito Dine in Treatment: 4 Vital Signs Height(in): 63 Pulse(bpm): 72 Weight(lbs): 205 Blood Pressure 182/50 (mmHg): Body Mass Index(BMI): 36 Temperature(F): 98.1 Respiratory Rate 18 (breaths/min): Photos: [N/A:N/A] Wound Location: Left Calcaneus - Lateral N/A N/A Wounding Event: Gradually Appeared N/A N/A Primary Etiology: Diabetic Wound/Ulcer of N/A N/A the Lower Extremity Comorbid History: Anemia, Coronary Artery N/A N/A Disease, Hypertension, Myocardial Infarction, Type II Diabetes, Osteoarthritis, Neuropathy Date Acquired: 08/12/2016 N/A N/A Weeks of Treatment: 4 N/A N/A Wound Status: Open N/A N/A Measurements L x W x D 2.3x2.1x0.3  N/A N/A (cm) Area (cm) : 3.793 N/A N/A Volume (cm) : 1.138 N/A N/A % Reduction in Area: 47.70% N/A N/A % Reduction in Volume: 21.60% N/A N/A Classification: Grade 1 N/A N/A Exudate Amount: Large N/A N/A Exudate Type: Serous N/A N/A Exudate Color: amber N/A N/A Yes N/A N/A [name], [name] B. ([id number]) Foul Odor After Cleansing: Odor Anticipated Due to No N/A N/A Product Use: Wound Margin: Flat and Intact N/A N/A Granulation Amount: Medium (34-66%) N/A N/A Granulation Quality: Red N/A N/A Necrotic Amount: Medium (34-66%) N/A N/A Necrotic Tissue: Eschar, Adherent Slough N/A N/A Exposed Structures: Fascia: No N/A N/A Fat Layer (Subcutaneous Tissue) Exposed: No Tendon: No Muscle: No Joint: No Bone: No Limited to Skin Breakdown Epithelialization: None N/A N/A Debridement: Debridement [id number] N/A N/A [id number]) Pre-procedure 16:05 N/A N/A Verification/Time Out Taken: Pain Control: Lidocaine 4% Topical N/A N/A Solution Tissue Debrided: Fibrin/Slough, Muscle, N/A N/A Subcutaneous Level: Skin/Subcutaneous N/A N/A Tissue Debridement Area (sq 4.83 N/A  N/A cm): Instrument: Curette N/A N/A Bleeding: Moderate N/A N/A Hemostasis Achieved: Pressure N/A N/A Procedural Pain: 0 N/A N/A Post Procedural Pain: 0 N/A N/A Debridement Treatment Procedure was tolerated N/A N/A Response: well Post Debridement 2.3x2.1x0.4 N/A N/A Measurements L x W x D (cm) Post Debridement 1.517 N/A N/A Volume: (cm) Periwound Skin Texture: Excoriation: No N/A N/A Induration: No Callus: No Crepitus: No Rash: No Scarring: No [name], [name] B. ([id number]) Periwound Skin Maceration: Yes N/A N/A Moisture: Dry/Scaly: No Periwound Skin Color: Atrophie Blanche: No N/A N/A Cyanosis: No Ecchymosis: No Erythema: No Hemosiderin Staining: No Mottled: No Pallor: No Rubor: No Temperature: No Abnormality N/A N/A Tenderness on Yes N/A N/A Palpation: Wound Preparation: Ulcer Cleansing: N/A N/A Rinsed/Irrigated with Saline Topical Anesthetic Applied: Other: lidocaine 4% Procedures Performed: Debridement N/A N/A Treatment Notes Wound #2 (Left, Lateral Calcaneus) 1. Cleansed with: Clean wound with Normal Saline 2. Anesthetic Topical Lidocaine 4% cream to wound bed prior to debridement 4. Dressing Applied: Santyl Ointment 5. Secondary Dressing Applied ABD Pad Dry Gauze 7. Secured with Tape Other (specify in notes) Notes kerlix and coban wrap Electronic Signature(s) Signed: 09/25/2016 7:47:19 AM By: Linton Ham MD Entered By: Linton Ham on 09/24/2016 16:15:04 Kesinger, [name] ([id number]) -------------------------------------------------------------------------------- Multi-Disciplinary Care Plan Details Patient Name: [name] Date of Service: 09/24/2016 3:30 PM Medical Record Number: [id number] Patient Account Number: [id number] Date of Birth/Sex: 1941-03-23 (76 y.o. Female) Treating RN: Montey Hora Primary Care Lyndee Herbst: Leighton Ruff Other Clinician: Referring Angelita Harnack: Leighton Ruff Treating Maximus Hoffert/Extender:  Tito Dine in Treatment: 4 Active Inactive ` Abuse / Safety / Falls / Self Care Management Nursing Diagnoses: Potential for falls Goals: Patient will remain injury free Date Initiated: 08/21/2016 Target Resolution Date: 10/08/2016 Goal Status: Active Interventions: Assess fall risk on admission and as needed Notes: ` Necrotic Tissue Nursing Diagnoses: Impaired tissue integrity related to necrotic/devitalized tissue Goals: Necrotic/devitalized tissue will be minimized in the wound bed Date Initiated: 08/21/2016 Target Resolution Date: 10/08/2016 Goal Status: Active Interventions: Assess patient pain level pre-, during and post procedure and prior to discharge Notes: ` Orientation to the Wound Care Program Nursing Diagnoses: Knowledge deficit related to the wound healing center program [name], [name] ([id number]) Goals: Patient/caregiver will verbalize understanding of the [place] Date Initiated: 08/21/2016 Target Resolution Date: 10/08/2016 Goal Status: Active Interventions: Provide education on orientation to the wound center Notes: ` Peripheral Neuropathy Nursing Diagnoses: Potential alteration in peripheral tissue perfusion (select prior to confirmation of diagnosis) Goals: Patient/caregiver will verbalize understanding  of disease process and disease management Date Initiated: 08/21/2016 Target Resolution Date: 10/08/2016 Goal Status: Active Interventions: Assess signs and symptoms of neuropathy upon admission and as needed Notes: ` Venous Leg Ulcer Nursing Diagnoses: Actual venous Insuffiency (use after diagnosis is confirmed) Goals: Patient will maintain optimal edema control Date Initiated: 08/21/2016 Target Resolution Date: 10/08/2016 Goal Status: Active Interventions: Assess peripheral edema status every visit. Notes: ` Wound/Skin Impairment Nursing Diagnoses: Impaired tissue integrity Politano, Electa B.  ([id number]) Goals: Patient/caregiver will verbalize understanding of skin care regimen Date Initiated: 08/21/2016 Target Resolution Date: 10/08/2016 Goal Status: Active Ulcer/skin breakdown will have a volume reduction of 30% by week 4 Date Initiated: 08/21/2016 Target Resolution Date: 10/08/2016 Goal Status: Active Ulcer/skin breakdown will have a volume reduction of 50% by week 8 Date Initiated: 08/21/2016 Target Resolution Date: 10/29/2016 Goal Status: Active Ulcer/skin breakdown will have a volume reduction of 80% by week 12 Date Initiated: 08/21/2016 Target Resolution Date: 11/12/2016 Goal Status: Active Ulcer/skin breakdown will heal within 14 weeks Date Initiated: 08/21/2016 Target Resolution Date: 11/12/2016 Goal Status: Active Interventions: Assess patient/caregiver ability to obtain necessary supplies Assess patient/caregiver ability to perform ulcer/skin care regimen upon admission and as needed Assess ulceration(s) every visit Notes: Electronic Signature(s) Signed: 09/24/2016 4:56:13 PM By: Montey Hora Entered By: Montey Hora on 09/24/2016 15:41:27 [name], [name] ([id number]) -------------------------------------------------------------------------------- Pain Assessment Details Patient Name: [name] Date of Service: 09/24/2016 3:30 PM Medical Record Number: [id number] Patient Account Number: [id number] Date of Birth/Sex: 04/25/1941 (76 y.o. Female) Treating RN: Montey Hora Primary Care Dawan Farney: Leighton Ruff Other Clinician: Referring Saverio Kader: Leighton Ruff Treating Laurie Penado/Extender: Tito Dine in Treatment: 4 Active Problems Location of Pain Severity and Description of Pain Patient Has Paino No Site Locations Pain Management and Medication Current Pain Management: Notes Topical or injectable lidocaine is offered to patient for acute pain when surgical debridement is performed. If needed, Patient is instructed to use over  the counter pain medication for the following 24-48 hours after debridement. Wound care MDs do not prescribed pain medications. Patient has chronic pain or uncontrolled pain. Patient has been instructed to make an appointment with their Primary Care Physician for pain management. Electronic Signature(s) Signed: 09/24/2016 4:56:13 PM By: Montey Hora Entered By: Montey Hora on 09/24/2016 15:29:03 [name], [name] ([id number]) -------------------------------------------------------------------------------- Patient/Caregiver Education Details Patient Name: [name] Date of Service: 09/24/2016 3:30 PM Medical Record Patient Account Number: [id number] Number: Treating RN: Montey Hora Jan 12, 1941 (76 y.o. Other Clinician: Date of Birth/Gender: Female) Treating ROBSON, MICHAEL Primary Care Physician: Leighton Ruff Physician/Extender: G Referring Physician: Bethanie Dicker in Treatment: 4 Education Assessment Education Provided To: Patient and Caregiver Education Topics Provided Venous: Handouts: Other: wrap changes Methods: Demonstration, Explain/Verbal Responses: State content correctly Electronic Signature(s) Signed: 09/24/2016 4:56:13 PM By: Montey Hora Entered By: Montey Hora on 09/24/2016 16:12:02 [name], [name] ([id number]) -------------------------------------------------------------------------------- Wound Assessment Details Patient Name: [name] Date of Service: 09/24/2016 3:30 PM Medical Record Number: [id number] Patient Account Number: [id number] Date of Birth/Sex: 1940/12/24 (76 y.o. Female) Treating RN: Montey Hora Primary Care Eyva Califano: Leighton Ruff Other Clinician: Referring Tarus Briski: Leighton Ruff Treating Durrel Mcnee/Extender: Tito Dine in Treatment: 4 Wound Status Wound Number: 2 Primary Diabetic Wound/Ulcer of the Lower Etiology: Extremity Wound Location: Left Calcaneus -  Lateral Wound Open Wounding Event: Gradually Appeared Status: Date Acquired: 08/12/2016 Comorbid Anemia, Coronary Artery Disease, Weeks Of Treatment: 4 History: Hypertension, Myocardial Infarction, Clustered Wound: No Type II Diabetes, Osteoarthritis, Neuropathy Photos Wound Measurements Length: (  cm) 2.3 Width: (cm) 2.1 Depth: (cm) 0.3 Area: (cm) 3.793 Volume: (cm) 1.138 % Reduction in Area: 47.7% % Reduction in Volume: 21.6% Epithelialization: None Tunneling: No Undermining: No Wound Description Classification: Grade 1 Foul Odor Afte Wound Margin: Flat and Intact Due to Product Exudate Amount: Large Slough/Fibrino Exudate Type: Serous Exudate Color: amber r Cleansing: Yes Use: No Yes Wound Bed Granulation Amount: Medium (34-66%) Exposed Structure Granulation Quality: Red Fascia Exposed: No Necrotic Amount: Medium (34-66%) Fat Layer (Subcutaneous Tissue) Exposed: No Necrotic Quality: Eschar, Adherent Slough Tendon Exposed: No Laible, Preston B. ([id number]) Muscle Exposed: No Joint Exposed: No Bone Exposed: No Limited to Skin Breakdown Periwound Skin Texture Texture Color No Abnormalities Noted: No No Abnormalities Noted: No Callus: No Atrophie Blanche: No Crepitus: No Cyanosis: No Excoriation: No Ecchymosis: No Induration: No Erythema: No Rash: No Hemosiderin Staining: No Scarring: No Mottled: No Pallor: No Moisture Rubor: No No Abnormalities Noted: No Dry / Scaly: No Temperature / Pain Maceration: Yes Temperature: No Abnormality Tenderness on Palpation: Yes Wound Preparation Ulcer Cleansing: Rinsed/Irrigated with Saline Topical Anesthetic Applied: Other: lidocaine 4%, Treatment Notes Wound #2 (Left, Lateral Calcaneus) 1. Cleansed with: Clean wound with Normal Saline 2. Anesthetic Topical Lidocaine 4% cream to wound bed prior to debridement 4. Dressing Applied: Santyl Ointment 5. Secondary Dressing Applied ABD Pad Dry Gauze 7.  Secured with Tape Other (specify in notes) Notes kerlix and coban wrap Electronic Signature(s) Signed: 09/24/2016 4:56:13 PM By: Montey Hora Entered By: Montey Hora on 09/24/2016 15:40:37 [name], [name] ([id number]) -------------------------------------------------------------------------------- Vitals Details Patient Name: [name] Date of Service: 09/24/2016 3:30 PM Medical Record Number: [id number] Patient Account Number: [id number] Date of Birth/Sex: 05-May-1941 (76 y.o. Female) Treating RN: Montey Hora Primary Care Natika Geyer: Leighton Ruff Other Clinician: Referring Dalayza Zambrana: Leighton Ruff Treating Jandy Brackens/Extender: Tito Dine in Treatment: 4 Vital Signs Time Taken: 15:29 Temperature (F): 98.1 Height (in): 63 Pulse (bpm): 72 Weight (lbs): 205 Respiratory Rate (breaths/min): 18 Body Mass Index (BMI): 36.3 Blood Pressure (mmHg): 182/50 Reference Range: 80 - 120 mg / dl Electronic Signature(s) Signed: 09/24/2016 4:56:13 PM By: Montey Hora Entered By: Montey Hora on 09/24/2016 15:30:05

## 2016-09-25 NOTE — Progress Notes (Signed)
[name] ([id number]) Visit Report for 09/24/2016 Chief Complaint Document Details Patient Name: [name], [name] Date of Service: 09/24/2016 3:30 PM Medical Record Patient Account Number: [id number] Number: Treating RN: [name] 10-31-1940 (76 y.o. Other Clinician: Date of Birth/Sex: Female) Treating [name] Primary Care Provider: Leighton [name] Provider/Extender: [name] in Treatment: 4 Information Obtained from: Patient Chief Complaint Patient is here for review of a wound on the left heel Electronic Signature(s) Signed: 09/25/2016 7:47:19 AM By: Linton Ham MD Entered By: Linton Ham on 09/24/2016 16:15:37 [name] ([id number]) -------------------------------------------------------------------------------- Debridement Details Patient Name: [name] Date of Service: 09/24/2016 3:30 PM Medical Record Patient Account Number: [id number] Number: Treating RN: [name] 04/08/1941 (76 y.o. Other Clinician: Date of Birth/Sex: Female) Treating Kamaiya Antilla, [place] Primary Care Provider: Leighton [name] Provider/Extender: [name] in Treatment: 4 Debridement Performed for Wound #2 Left,Lateral Calcaneus Assessment: Performed By: Physician Ricard Dillon, MD Debridement: Debridement Pre-procedure Yes - 16:05 Verification/Time Out Taken: Start Time: 16:05 Pain Control: Lidocaine 4% Topical Solution Level: Skin/Subcutaneous Tissue Total Area Debrided (L x 2.3 (cm) x 2.1 (cm) = 4.83 (cm) W): Tissue and other Viable, Non-Viable, Fibrin/Slough, Muscle, Subcutaneous material debrided: Instrument: Curette Bleeding: Moderate Hemostasis Achieved: Pressure End Time: 16:08 Procedural Pain: 0 Post Procedural Pain: 0 Response to Treatment: Procedure was tolerated well Post Debridement Measurements of Total Wound Length: (cm)  2.3 Width: (cm) 2.1 Depth: (cm) 0.4 Volume: (cm) 1.517 Character of Wound/Ulcer Post Improved Debridement: Severity of Tissue Post Debridement: Necrosis of muscle Post Procedure Diagnosis Same as Pre-procedure Electronic Signature(s) Signed: 09/24/2016 4:56:13 PM By: [name] Signed: 09/25/2016 7:47:19 AM By: Linton Ham MD [name], [name] ([id number]) Entered By: Linton Ham on 09/24/2016 16:15:20 [name] ([id number]) -------------------------------------------------------------------------------- HPI Details Patient Name: [name] Date of Service: 09/24/2016 3:30 PM Medical Record Patient Account Number: [id number] Number: Treating RN: [name] [birth year] (76 y.o. Other Clinician: Date of Birth/Sex: Female) Treating Cyree Chuong Primary Care Provider: Leighton [name] Provider/Extender: [name] in Treatment: 4 History of Present Illness HPI Description: 08/21/16; [name] is a 76 year old woman who I cared for in our [facility] clinic up until January 2016. At that point she had a wound in her left great toe. 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 He follows with vein and vascular and has a follow-up appointment in early February. She is status left femoral to anterior tibial bypass in February 2016. She is also noted to have venous stasis changes and a history of ulcers. 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 fib on Eliquis. She has bladder cancer and has a chronic urostomy. The patient tells me that 2 weeks ago she had a right prosthesis and a new left diabetic shoe. Roughly  one week ago she noted a wound on her left lateral heel. She zinc oxide and some silver alginate she had left over until she could be seen here. It was also noted that she has had blistering on her posterior left calf and an open area here as well. Her records note that she has chronic venous insufficiency  with chronic inflammation especially in the left mid calf area. The patient has not been systemically unwell no fever or chills. She is reasonably insensate and has not felt any pain 09/04/16; the patient has a fairly substantial wound on the left lateral calcaneus. We have been using Santyl to this area. Blister on the left lower leg appears to of healed the patient also follows with Dr. Sherren Mocha early of vein and vascular. He felt that she should have enough blood flow to heal her left heel wound. He renewed her doxycycline for a further week. She is status post left femoral to anterior tibial bypass in February 2016. 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 She has a remote history of a right BKA. The XRAY that I ordered last week showed no definite evidence of osteomyelitis however a small focus of demineralization was noted in the plantar aspect of the calcaneus which might warrant an MRI possibly indicating early osteomyelitis however this is not in the area of her wound and I'm going to keep this under advisement for now. 09/11/16; still a substantial wound in the left lateral calcaneus we've been using Santyl 09/18/16; still a substantial wound on the left lateral calcaneus. We are still using Santyl 09/24/16; still a substantial wound on the left lateral calcaneus. We're using Santyl change one or 2 times a week and mechanical debridement which he comes to [place]. [name], [name] ([id number]) Electronic Signature(s) Signed: 09/25/2016 7:47:19 AM By: Linton Ham MD Entered By: Linton Ham on 09/24/2016 16:16:14 [name]  ([id number]) -------------------------------------------------------------------------------- Physical Exam Details Patient Name: [name] Date of Service: 09/24/2016 3:30 PM Medical Record Patient Account Number: [id number] Number: Treating RN: [name] 1941-03-28 (76 y.o. Other Clinician: Date of Birth/Sex: Female) Treating Noralee Dutko Primary Care Provider: Leighton [name] Provider/Extender: [name] in Treatment: 4 Constitutional Patient is hypertensive.. Pulse regular and within target range for patient.Marland Kitchen Respirations regular, non-labored and within target range.. Temperature is normal and within the target range for the patient.. Cardiovascular Dorsalis pedis pulses palpable on the left. Notes Wound exam; the lateral plantar heel on the left continues to be a fairly substantial wound. This is not a weight-bearing surface and she is being careful an offloading the area. Using a #5 curet I debrided large amounts of nonviable necrotic tissue probably muscle. After a fairly aggressive debridement there appears to be a viable surface although more debridement is likely to be necessary. When we get this down to a surface that we'll support healing I think we are going to need to move fairly aggressively to an advanced treatment option. Likely Apligraf Electronic Signature(s) Signed: 09/25/2016 7:47:19 AM By: Linton Ham MD Entered By: Linton Ham on 09/24/2016 16:18:11 [name] ([id number]) -------------------------------------------------------------------------------- Physician Orders Details Patient Name: [name] Date of Service: 09/24/2016 3:30 PM Medical Record Patient Account Number: [id number] Number: Treating RN: [name] 1941-05-12 (76 y.o. Other Clinician: Date of Birth/Sex: Female) Treating Blong Busk Primary Care Provider: Leighton [name] Provider/Extender: [name] in Treatment: 4 Verbal / Phone Orders: No Diagnosis Coding Wound Cleansing Wound #2 Left,Lateral Calcaneus o Clean wound with Normal Saline. Anesthetic Wound #2 Left,Lateral Calcaneus o Topical Lidocaine 4% cream applied to wound bed prior to debridement Primary Wound Dressing Wound #2 Left,Lateral Calcaneus o Santyl Ointment Secondary Dressing Wound #2 Left,Lateral Calcaneus o ABD pad o Dry Gauze Dressing Change Frequency Wound #2 Left,Lateral Calcaneus o Other: - twice  weekly - home nurse to change wrap when she visits (may change more than twice weekly if nurse visits more often) Follow-up Appointments Wound #2 Left,Lateral Calcaneus o Return Appointment in 1 week. Edema Control Wound #2 Left,Lateral Calcaneus o Kerlix and Coban - Left Lower Extremity Off-Loading Wound #2 Left,Lateral Calcaneus o Other: - open heel shoe [name], [name] B. ([id number]) Additional Orders / Instructions Wound #2 Left,Lateral Calcaneus o Increase protein intake. Medications-please add to medication list. Wound #2 Left,Lateral Calcaneus o Santyl Enzymatic Ointment Electronic Signature(s) Signed: 09/24/2016 4:56:13 PM By: [name] Signed: 09/25/2016 7:47:19 AM By: Linton Ham MD Entered By: [name] on 09/24/2016 16:10:15 [place], [name] ([id number]) -------------------------------------------------------------------------------- Problem List Details Patient Name: [name] Date of Service: 09/24/2016 3:30 PM Medical Record Patient Account Number: [id number] Number: Treating RN: [name] [birth year] (76 y.o. Other Clinician: Date of Birth/Sex: Female) Treating De Libman Primary Care Provider: Leighton [name] Provider/Extender: [name] in Treatment: 4 Active Problems ICD-10 Encounter Code Description  Active Date Diagnosis E11.621 Type 2 diabetes mellitus with foot ulcer 08/21/2016 Yes E11.51 Type 2 diabetes mellitus with diabetic peripheral 08/21/2016 Yes angiopathy without gangrene E11.40 Type 2 diabetes mellitus with diabetic neuropathy, 08/21/2016 Yes unspecified L97.423 Non-pressure chronic ulcer of left heel and midfoot with 08/21/2016 Yes necrosis of muscle I87.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 hypertension (idiopathic) with 08/21/2016 Yes inflammation of left lower extremity L97.221 Non-pressure chronic ulcer of left calf limited to 08/21/2016 Yes breakdown of skin L03.116 Cellulitis of left lower limb 08/21/2016 Yes Inactive Problems Resolved Problems Electronic Signature(s) EDA, PELLMAN ([id number]) Signed: 09/25/2016 7:47:19 AM By: Linton Ham MD Entered By: Linton Ham on 09/24/2016 16:14:48 [name] ([id number]) -------------------------------------------------------------------------------- Progress Note Details Patient Name: [name] Date of Service: 09/24/2016 3:30 PM Medical Record Patient Account Number: [id number] Number: Treating RN: [name] June 01, 1941 (76 y.o. Other Clinician: Date of Birth/Sex: Female) Treating Jarelis Ehlert Primary Care Provider: Leighton [name] Provider/Extender: [name] in Treatment: 4 Subjective Chief Complaint Information obtained from Patient Patient is here for review of a wound on the left heel History of Present Illness (HPI) 08/21/16; [name] is a 76 year old woman who I cared for in our [facility] clinic up until January 2016. At that point she had a wound in her left great toe. 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 He follows with vein and vascular and has a follow-up appointment in early  February. She is status left femoral to anterior tibial bypass in February 2016. She is also noted to have venous stasis changes and a history of ulcers. 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 fib on Eliquis. She has bladder cancer and has a chronic urostomy. The patient tells me that 2 weeks ago she had a right prosthesis and a new left diabetic shoe. Roughly one week ago she noted a wound on her left lateral heel. She zinc oxide and some silver alginate she had left over until she could be seen here. It was also noted that she has had blistering on her posterior left calf and an open area here as well. Her records note that she has chronic  venous insufficiency with chronic inflammation especially in the left mid calf area. The patient has not been systemically unwell no fever or chills. She is reasonably insensate and has not felt any pain 09/04/16; the patient has a fairly substantial wound on the left lateral calcaneus. We have been using Santyl to this area. Blister on the left lower leg appears to of healed the patient also follows with Dr. Sherren Mocha early of vein and vascular. He felt that she should have enough blood flow to heal her left heel wound. He renewed her doxycycline for a further week. She is status post left femoral to anterior tibial bypass in February 2016. 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 She has a remote history of a right BKA. The XRAY that I ordered last week showed no definite evidence of osteomyelitis however a small focus of demineralization was noted in the plantar aspect of the calcaneus which might warrant an MRI possibly indicating early osteomyelitis however this is not in the area of her wound and I'm going to keep this under advisement for now. 09/11/16; still a substantial wound in the left lateral calcaneus we've  been using Santyl [name], [name] B. ([id number]) 09/18/16; still a substantial wound on the left lateral calcaneus. We are still using Santyl 09/24/16; still a substantial wound on the left lateral calcaneus. We're using Santyl change one or 2 times a week and mechanical debridement which he comes to [place]. Objective Constitutional Patient is hypertensive.. Pulse regular and within target range for patient.Marland Kitchen Respirations regular, non-labored and within target range.. Temperature is normal and within the target range for the patient.. Vitals Time Taken: 3:29 PM, Height: 63 in, Weight: 205 lbs, BMI: 36.3, Temperature: 98.1 F, Pulse: 72 bpm, Respiratory Rate: 18 breaths/min, Blood Pressure: 182/50 mmHg. Cardiovascular Dorsalis pedis pulses palpable on the left. General Notes: Wound exam; the lateral plantar heel on the left continues to be a fairly substantial wound. This is not a weight-bearing surface and she is being careful an offloading the area. Using a #5 curet I debrided large amounts of nonviable necrotic tissue probably muscle. After a fairly aggressive debridement there appears to be a viable surface although more debridement is likely to be necessary. When we get this down to a surface that we'll support healing I think we are going to need to move fairly aggressively to an advanced treatment option. Likely Apligraf Integumentary (Hair, Skin) Wound #2 status is Open. Original cause of wound was Gradually Appeared. The wound is located on the Left,Lateral Calcaneus. The wound measures 2.3cm length x 2.1cm width x 0.3cm depth; 3.793cm^2 area and 1.138cm^3 volume. The wound is limited to skin breakdown. There is no tunneling or undermining noted. There is a large amount of serous drainage noted. The wound margin is flat and intact. There is medium (34-66%) red granulation within the wound bed. There is a medium (34-66%) amount of necrotic tissue within the wound bed including Eschar and  Adherent Slough. The periwound skin appearance exhibited: Maceration. The periwound skin appearance did not exhibit: Callus, Crepitus, Excoriation, Induration, Rash, Scarring, Dry/Scaly, Atrophie Blanche, Cyanosis, Ecchymosis, Hemosiderin Staining, Mottled, Pallor, Rubor, Erythema. Periwound temperature was noted as No Abnormality. The periwound has tenderness on palpation. Assessment Active Problems ICD-10 DELEAH, RAWLINGS ([id number]) E11.621 - Type 2 diabetes mellitus with foot ulcer E11.51 - Type 2 diabetes mellitus with diabetic peripheral angiopathy without gangrene E11.40 - Type 2 diabetes mellitus with diabetic neuropathy, unspecified L97.423 - Non-pressure chronic ulcer of left heel and  midfoot with necrosis of muscle I87.322 - 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 L97.221 - Non-pressure chronic ulcer of left calf limited to breakdown of skin L03.116 - Cellulitis of left lower limb Procedures Wound #2 Wound #2 is a Diabetic Wound/Ulcer of the Lower Extremity located on the Left,Lateral Calcaneus . There was a Skin/Subcutaneous Tissue Debridement [id number]) debridement with total area of 4.83 sq cm performed by Ricard Dillon, MD. with the following instrument(s): Curette to remove Viable and Non-Viable tissue/material including Fibrin/Slough, Muscle, and Subcutaneous after achieving pain control using Lidocaine 4% Topical Solution. A time out was conducted at 16:05, prior to the start of the procedure. A Moderate amount of bleeding was controlled with Pressure. The procedure was tolerated well with a pain level of 0 throughout and a pain level of 0 following the procedure. Post Debridement Measurements: 2.3cm length x 2.1cm width x 0.4cm depth; 1.517cm^3 volume. Character of Wound/Ulcer Post Debridement is improved. Severity of Tissue Post Debridement is: Necrosis of muscle. Post procedure Diagnosis Wound #2: Same as  Pre-Procedure Plan Wound Cleansing: Wound #2 Left,Lateral Calcaneus: Clean wound with Normal Saline. Anesthetic: Wound #2 Left,Lateral Calcaneus: Topical Lidocaine 4% cream applied to wound bed prior to debridement Primary Wound Dressing: Wound #2 Left,Lateral Calcaneus: Santyl Ointment Secondary Dressing: Wound #2 Left,Lateral Calcaneus: ABD pad Dry Gauze Dressing Change Frequency: Wound #2 Left,Lateral Calcaneus: [name], [name] B. ([id number]) Other: - twice weekly - home nurse to change wrap when she visits (may change more than twice weekly if nurse visits more often) Follow-up Appointments: Wound #2 Left,Lateral Calcaneus: Return Appointment in 1 week. Edema Control: Wound #2 Left,Lateral Calcaneus: Kerlix and Coban - Left Lower Extremity Off-Loading: Wound #2 Left,Lateral Calcaneus: Other: - open heel shoe Additional Orders / Instructions: Wound #2 Left,Lateral Calcaneus: Increase protein intake. Medications-please add to medication list.: Wound #2 Left,Lateral Calcaneus: Santyl Enzymatic Ointment Continue santyl continue mechanical debridement Run Apligraf next week Electronic Signature(s) Signed: 09/25/2016 7:47:19 AM By: Linton Ham MD Entered By: Linton Ham on 09/24/2016 16:19:47 [name] ([id number]) -------------------------------------------------------------------------------- SuperBill Details Patient Name: [name] Date of Service: 09/24/2016 Medical Record Patient Account Number: [id number] Number: Treating RN: [name] 1941-05-23 (76 y.o. 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 I87.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 L97.221 Non-pressure chronic ulcer of left calf limited to breakdown of skin L03.116 Cellulitis of left lower limb Facility Procedures CPT4 Code Description: [id number] - DEB MUSC/FASCIA 20 SQ CM/< ICD-10 Description Diagnosis E11.621 Type 2 diabetes mellitus with foot ulcer L97.423 Non-pressure chronic ulcer of left heel and midfoot Modifier: with necrosis Quantity: 1 of muscle Physician Procedures CPT4 Code Description: [id number] - WC PHYS DEBR MUSCLE/FASCIA 20 SQ CM ICD-10 Description Diagnosis E11.621 Type 2 diabetes mellitus with foot ulcer L97.423 Non-pressure chronic ulcer of left heel and midfoot wit Modifier: h necrosis Quantity: 1 of muscle Electronic Signature(s) Signed: 09/25/2016 7:47:19 AM By: Linton Ham MD Entered By: Linton Ham on 09/24/2016 16:20:49

## 2016-09-27 ENCOUNTER — Other Ambulatory Visit: Payer: Self-pay | Admitting: Cardiology

## 2016-09-27 ENCOUNTER — Other Ambulatory Visit: Payer: Self-pay | Admitting: *Deleted

## 2016-09-30 ENCOUNTER — Other Ambulatory Visit: Payer: Self-pay | Admitting: Pharmacist

## 2016-09-30 NOTE — Patient Outreach (Signed)
[place] [facility]) Care Management  09/30/2016  TELA LANDERO July 26, 1941 [id number]   Called patient to follow up on insulin dosing and to see if she scheduled an appointment with her PCP as requested.  No answer. HIPAA compliant message left on her voicemail.  Plan:  I will call the patient back within 2-3 business days.   [name], PharmD, [place] Clinical Pharmacist [phone]

## 2016-10-01 ENCOUNTER — Other Ambulatory Visit: Payer: Self-pay | Admitting: Pharmacist

## 2016-10-01 NOTE — Patient Outreach (Signed)
Telephone assessment attempted, no answer, left message to return my call at pt convenience.  [name] [facility] [place] [phone]

## 2016-10-01 NOTE — Patient Outreach (Signed)
[facility]) Care Management  [place]   10/01/2016  TAEA DELANGE June 01, 1941 [id number]  Subjective:  Patient was called to follow up on medication adherence with insulin.  HIPAA identifiers were obtained.  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    Patient reported better blood glucose control since she now has Lantus and Novolog in her possession.  She said she has not had any hypoglycemia  However, she does not have a prescribed sliding scale from the provider so she gives herself 5-20 units of Novolog based on what her blood sugar readings are two- three times per day.  She also reported giving varying amounts of Lantus based on blood glucose values. Patient said she has been very busy with wound clinic appointments and did not get around to making an appointment with Dr. Drema Dallas as she said she would.  Patient stated she is continuing to go to frequent wound clinic appointments and is using Santyl ointment on her wound.   Objective:   Encounter Medications: Outpatient Encounter Prescriptions as of 10/01/2016  Medication Sig Note  . acetaminophen (TYLENOL) 500 MG tablet Take 1,000 mg by mouth every 6 (six) hours as needed for mild pain.  08/23/2016: PRN ONLY  . amiodarone (PACERONE) 200 MG tablet Take 1 tablet (200 mg total) by mouth daily. OVERDUE FOR FOLLOW UP. CALL AND SCHEDULE [phone]   . apixaban (ELIQUIS) 5 MG TABS tablet Take 1 tablet (5 mg total) by mouth 2 (two) times daily.   Marland Kitchen atorvastatin (LIPITOR) 80 MG tablet TAKE 1 TABLET (80 MG TOTAL) BY MOUTH EVERY EVENING.   . calcitRIOL (ROCALTROL) 0.25 MCG  capsule Take 1 capsule by mouth daily.   . collagenase (SANTYL) ointment Apply 1 application topically daily.   . furosemide (LASIX) 40 MG tablet Take 80 mg by mouth 2 (two) times daily. Takes all doses as often as she can.    . insulin glargine (LANTUS) 100 UNIT/ML injection Inject 0.3 mLs (30 Units total) into the skin 2 (two) times daily. 55 units in the morning and 60 units at night   . isosorbide mononitrate (IMDUR) 30 MG 24 hr tablet Take 1 tablet (30 mg total) by mouth daily. Please call and schedule follow up appt for further refills [phone]   . Magnesium 400 MG TABS Take 400 mg by mouth daily.    . metoprolol succinate (TOPROL-XL) 100 MG 24 hr tablet TAKE 1 TABLET (100 MG TOTAL) BY MOUTH EVERY EVENING. TAKE WITH OR IMMEDIATELY FOLLOWING A MEAL.   . Multiple Vitamins-Minerals (PRESERVISION AREDS PO) Take 1 capsule by mouth 2 (two) times daily.   . nitroGLYCERIN (NITROSTAT) 0.6 MG SL tablet Take 0.6 mg by mouth every 5 (five) minutes x 3 doses as needed.   Marland Kitchen NOVOLOG FLEXPEN 100 UNIT/ML FlexPen Use per sliding scale 09/03/2016: Patient does not have a sliding scale at home to use as a guide for dosing.  Marland Kitchen omeprazole (PRILOSEC) 20 MG capsule Take 20 mg by mouth every evening.    . polyethylene glycol (MIRALAX / GLYCOLAX) packet Take 17 g by mouth as needed. 08/23/2016: ONLY USES PRN  . potassium chloride SA (K-DUR,KLOR-CON) 20 MEQ tablet Take 20 mEq by mouth daily.    Marland Kitchen senna (SENOKOT) 8.6 MG  TABS tablet Take 4 tablets by mouth at bedtime as needed (([employer])). Takes 1 tab daily and can take addt'l tab as needed for constipation   . sodium bicarbonate 650 MG tablet Take 1,300 mg by mouth 2 (two) times daily.   . Melatonin 10 MG TABS Take 10 mg by mouth at bedtime.  10/01/2016: Has not taken in the last month  . [DISCONTINUED] Psyllium (VEGETABLE LAXATIVE PO) Take 4 tablets by mouth at bedtime. 08/23/2016: Senna/Cascara (herbal Laxative from [employer])   No facility-administered  encounter medications on file as of 10/01/2016.     Functional Status: In your present state of health, do you have any difficulty performing the following activities: 08/16/2016 11/24/2015  Hearing? N N  Vision? Y N  Difficulty concentrating or making decisions? N N  Walking or climbing stairs? Y Y  Dressing or bathing? Y Y  Doing errands, shopping? Y -  Conservation officer, nature and eating ? N -  Using the Toilet? N -  In the past six months, have you accidently leaked urine? N -  Do you have problems with loss of bowel control? Y -  Managing your Medications? N -  Managing your Finances? N -  Housekeeping or managing your Housekeeping? Y -  Some recent data might be hidden    Fall/Depression Screening: PHQ 2/9 Scores 08/16/2016  PHQ - 2 Score 4  PHQ- 9 Score 8    Assessment:  Medications and allergies were reconciled with the patient via telephone:  Drugs sorted by system:  Cardiovascular Amiodarone Atorvastatin Eliquis Furosemide Isosorbide Metoprolol succinate  Nitroglycerin  Gastrointestinal: Omeprazole Polyethylene Glycol (Miralax)  Vegetable laxative (Sennosides/cascara)  Endocrine: Lantus  Novolog  **Adherence: 1.  Novolog-patient does not have a printed/prescribed sliding scale. She is dosing Novolog based on blood sugars 2-3 times per day. She reported giving 5-20 units at each dose.  2.  Lantus- patient reported giving varying amounts of Lantus based on blood sugar readings ( within 5-10 units of what was prescribed).   Renal: calcitriol  Topical: Santyl-recently prescribed by Wound Center  Pain: Acetaminophen  Vitamins/Minerals: Magnesium Sodium Bicarbonate Multiple Vitamin (eye vitamin) Potassium Chloride  Miscellaneous: Melatonin   Plan:  1.  Patient was educated on how Lantus works to decrease blood sugar and encouraged to follow the instructions given by her provider.  2. Hypoglycemia management was reviewed at length.    3.  Patient was encouraged to make an appointment to see her primary care provider, Dr. Drema Dallas.  ( Patient "promised" she would make an appointment with Dr. Emilio Math).  4.  I will call the patient within two weeks to follow up on her making an appointment with Dr. Drema Dallas.  [name], PharmD, [place] Clinical Pharmacist [phone]

## 2016-10-02 ENCOUNTER — Other Ambulatory Visit: Payer: Self-pay | Admitting: *Deleted

## 2016-10-02 ENCOUNTER — Encounter: Payer: PPO | Admitting: Internal Medicine

## 2016-10-02 ENCOUNTER — Ambulatory Visit: Payer: Self-pay | Admitting: Pharmacist

## 2016-10-02 DIAGNOSIS — E11621 Type 2 diabetes mellitus with foot ulcer: Secondary | ICD-10-CM | POA: Diagnosis not present

## 2016-10-02 DIAGNOSIS — L97422 Non-pressure chronic ulcer of left heel and midfoot with fat layer exposed: Secondary | ICD-10-CM | POA: Diagnosis not present

## 2016-10-02 NOTE — Patient Outreach (Signed)
Telephone call to pt to schedule a home visit. I left a message as pt did not answer the phone.  [name] [facility] [place] [phone]

## 2016-10-03 ENCOUNTER — Other Ambulatory Visit: Payer: Self-pay | Admitting: *Deleted

## 2016-10-03 NOTE — Progress Notes (Signed)
MAYMIE, VORNDRAN ([id number]) Visit Report for 10/02/2016 Chief Complaint Document Details Patient Name: [name], [name] Date of Service: 10/02/2016 3:30 PM Medical Record Patient Account Number: [id number] Number: Treating RN: Montey Hora [birth year] (76 y.o. Other Clinician: Date of Birth/Sex: Female) Treating Aamina Skiff Primary Care Provider: Leighton Ruff Provider/Extender: G Referring Provider: Bethanie Dicker in Treatment: 6 Information Obtained from: Patient Chief Complaint Patient is here for review of a wound on the left heel Electronic Signature(s) Signed: 10/02/2016 4:59:22 PM By: Linton Ham MD Entered By: Linton Ham on 10/02/2016 16:49:01 Canadian, [name] ([id number]) -------------------------------------------------------------------------------- Debridement Details Patient Name: [name] Date of Service: 10/02/2016 3:30 PM Medical Record Patient Account Number: [id number] Number: Treating RN: Montey Hora 1941-04-05 (76 y.o. Other Clinician: Date of Birth/Sex: Female) Treating Maila Dukes, [place] Primary Care Provider: Leighton Ruff Provider/Extender: G Referring Provider: Bethanie Dicker in Treatment: 6 Debridement Performed for Wound #2 Left,Lateral Calcaneus Assessment: Performed By: Physician Ricard Dillon, MD Debridement: Debridement Pre-procedure Yes - 15:49 Verification/Time Out Taken: Start Time: 15:49 Pain Control: Lidocaine 4% Topical Solution Level: Skin/Subcutaneous Tissue Total Area Debrided (L x 2.3 (cm) x 2.1 (cm) = 4.83 (cm) W): Tissue and other Viable, Non-Viable, Fibrin/Slough, Subcutaneous material debrided: Instrument: Curette Bleeding: Moderate Hemostasis Achieved: Pressure End Time: 15:52 Procedural Pain: 0 Post Procedural Pain: 0 Response to Treatment: Procedure was tolerated well Post Debridement Measurements of Total Wound Length: (cm) 2.3 Width:  (cm) 2.1 Depth: (cm) 0.3 Volume: (cm) 1.138 Character of Wound/Ulcer Post Improved Debridement: Severity of Tissue Post Debridement: Fat layer exposed Post Procedure Diagnosis Same as Pre-procedure Electronic Signature(s) Signed: 10/02/2016 4:59:22 PM By: Linton Ham MD Signed: 10/02/2016 5:23:30 PM By: [name], [name] ([id number]) Entered By: Linton Ham on 10/02/2016 16:48:41 Jackowski, [name] ([id number]) -------------------------------------------------------------------------------- HPI Details Patient Name: [name] Date of Service: 10/02/2016 3:30 PM Medical Record Patient Account Number: [id number] Number: Treating RN: Montey Hora 02/10/1941 (76 y.o. Other Clinician: Date of Birth/Sex: Female) Treating Caison Hearn Primary Care Provider: Leighton Ruff Provider/Extender: G Referring Provider: Bethanie Dicker in Treatment: 6 History of Present Illness HPI Description: 08/21/16; Mrs. Crooker is a 76 year old woman who I cared for in our [facility] clinic up until January 2016. At that point she had a wound in her left great toe. 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 He follows with vein and vascular and has a follow-up appointment in early February. She is status left femoral to anterior tibial bypass in February 2016. She is also noted to have venous stasis changes and a history of ulcers. 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 fib on Eliquis. She has bladder cancer and has a chronic urostomy. The patient tells me that 2 weeks ago she had a right prosthesis and a new left diabetic shoe. Roughly one week ago she  noted a wound on her left lateral heel. She zinc oxide and some silver alginate she had left over until she could be seen here. It was also noted that she has had blistering on her posterior left calf and an open area here as well. Her records note that she has chronic venous insufficiency with  chronic inflammation especially in the left mid calf area. The patient has not been systemically unwell no fever or chills. She is reasonably insensate and has not felt any pain 09/04/16; the patient has a fairly substantial wound on the left lateral calcaneus. We have been using Santyl to this area. Blister on the left lower leg appears to of healed the patient also follows with Dr. Sherren Mocha early of vein and vascular. He felt that she should have enough blood flow to heal her left heel wound. He renewed her doxycycline for a further week. She is status post left femoral to anterior tibial bypass in February 2016. 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 She has a remote history of a right BKA. The XRAY that I ordered last week showed no definite evidence of osteomyelitis however a small focus of demineralization was noted in the plantar aspect of the calcaneus which might warrant an MRI possibly indicating early osteomyelitis however this is not in the area of her wound and I'm going to keep this under advisement for now. 09/11/16; still a substantial wound in the left lateral calcaneus we've been using Santyl 09/18/16; still a substantial wound on the left lateral calcaneus. We are still using Santyl 09/24/16; still a substantial wound on the left lateral calcaneus. We're using Santyl change one or 2 times a week and mechanical debridement which he comes to [place]. 10/02/16; left lateral calcaneus using Santyl changing 3 times a week. Run Apligraf through her insurance JENAYA, EASTMOND ([id number]) Electronic Signature(s) Signed: 10/02/2016 4:59:22 PM By: Linton Ham MD Entered By: Linton Ham on  10/02/2016 16:49:40 Manges, [name] ([id number]) -------------------------------------------------------------------------------- Physical Exam Details Patient Name: [name] Date of Service: 10/02/2016 3:30 PM Medical Record Patient Account Number: [id number] Number: Treating RN: Montey Hora [birth year] (76 y.o. Other Clinician: Date of Birth/Sex: Female) Treating Melaysia Streed Primary Care Provider: Leighton Ruff Provider/Extender: G Referring Provider: Bethanie Dicker in Treatment: 6 Constitutional Sitting or standing Blood Pressure is within target range for patient.. Pulse regular and within target range for patient.Marland Kitchen Respirations regular, non-labored and within target range.. Temperature is normal and within the target range for the patient.. Patient's appearance is neat and clean. Appears in no acute distress. Well nourished and well developed.. Notes Wound exam; lateral plantar heel. Using a #3 curette again I debrided this area of necrotic surface material. This actually comes off easier this week and the base looks somewhat healthy although still precariously close to bone. Hemostasis with direct pressure. There is no evidence of surrounding subcutaneous infection. Electronic Signature(s) Signed: 10/02/2016 4:59:22 PM By: Linton Ham MD Entered By: Linton Ham on 10/02/2016 16:50:50 Springsteen, [name] ([id number]) -------------------------------------------------------------------------------- Physician Orders Details Patient Name: [name] Date of Service: 10/02/2016 3:30 PM Medical Record Patient Account Number: [id number] Number: Treating RN: Montey Hora May 02, 1941 (76 y.o. Other Clinician: Date of Birth/Sex: Female) Treating Shavanna Furnari Primary Care Provider: Leighton Ruff Provider/Extender: G Referring Provider: Bethanie Dicker in Treatment: 6 Verbal / Phone Orders: No Diagnosis  Coding Wound Cleansing Wound #2 Left,Lateral Calcaneus o Clean wound with Normal Saline. Anesthetic Wound #2 Left,Lateral Calcaneus o Topical Lidocaine 4% cream applied to wound bed prior to debridement Primary Wound Dressing Wound #2 Left,Lateral Calcaneus o Santyl Ointment Secondary Dressing Wound #2 Left,Lateral Calcaneus o ABD pad o Dry Gauze Dressing Change Frequency Wound #2 Left,Lateral Calcaneus o Other: - twice weekly - home nurse to change wrap when she visits (may change more than twice  weekly if nurse visits more often) Follow-up Appointments Wound #2 Left,Lateral Calcaneus o Return Appointment in 1 week. Edema Control Wound #2 Left,Lateral Calcaneus o Kerlix and Coban - Left Lower Extremity Off-Loading Wound #2 Left,Lateral Calcaneus o Other: - open heel shoe Nienhuis, Brittanee B. ([id number]) Additional Orders / Instructions Wound #2 Left,Lateral Calcaneus o Increase protein intake. Medications-please add to medication list. Wound #2 Left,Lateral Calcaneus o Santyl Enzymatic Ointment Notes preauthorize for apligraf Electronic Signature(s) Signed: 10/02/2016 4:59:22 PM By: Linton Ham MD Signed: 10/02/2016 5:23:30 PM By: Montey Hora Entered By: Montey Hora on 10/02/2016 15:52:34 Lyvers, [name] ([id number]) -------------------------------------------------------------------------------- Problem List Details Patient Name: [name] Date of Service: 10/02/2016 3:30 PM Medical Record Patient Account Number: [id number] Number: Treating RN: Montey Hora Mar 22, 1941 (76 y.o. Other Clinician: Date of Birth/Sex: Female) Treating Sovereign Ramiro Primary Care Provider: Leighton Ruff Provider/Extender: G Referring Provider: Bethanie Dicker in Treatment: 6 Active Problems ICD-10 Encounter Code Description Active Date Diagnosis E11.621 Type 2 diabetes mellitus with foot ulcer 08/21/2016 Yes E11.51  Type 2 diabetes mellitus with diabetic peripheral 08/21/2016 Yes angiopathy without gangrene E11.40 Type 2 diabetes mellitus with diabetic neuropathy, 08/21/2016 Yes unspecified L97.423 Non-pressure chronic ulcer of left heel and midfoot with 08/21/2016 Yes necrosis of muscle I87.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 hypertension (idiopathic) with 08/21/2016 Yes inflammation of left lower extremity L97.221 Non-pressure chronic ulcer of left calf limited to 08/21/2016 Yes breakdown of skin L03.116 Cellulitis of left lower limb 08/21/2016 Yes Inactive Problems Resolved Problems Electronic Signature(s) GORDON, KATHOL ([id number]) Signed: 10/02/2016 4:59:22 PM By: Linton Ham MD Entered By: Linton Ham on 10/02/2016 16:48:16 Whittingham, [name] ([id number]) -------------------------------------------------------------------------------- Progress Note Details Patient Name: [name] Date of Service: 10/02/2016 3:30 PM Medical Record Patient Account Number: [id number] Number: Treating RN: Montey Hora [birth year] (76 y.o. Other Clinician: Date of Birth/Sex: Female) Treating Dearis Danis Primary Care Provider: Leighton Ruff Provider/Extender: G Referring Provider: Bethanie Dicker in Treatment: 6 Subjective Chief Complaint Information obtained from Patient Patient is here for review of a wound on the left heel History of Present Illness (HPI) 08/21/16; Mrs. Berkson is a 76 year old woman who I cared for in our [facility] clinic up until January 2016. At that point she had a wound in her left great toe. 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 He follows with vein and vascular and has a follow-up appointment in early February. She is status left femoral to anterior tibial bypass in February 2016. She is also  noted to have venous stasis changes and a history of ulcers. 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 fib on Eliquis. She has bladder cancer and has a chronic urostomy. The patient tells me that 2 weeks ago she had a right prosthesis and a new left diabetic shoe. Roughly one week ago she noted a wound on her left lateral heel. She zinc oxide and some silver alginate she had left over until she could be seen here. It was also noted that she has had blistering on her posterior left calf and an open area here as well. Her records note that she has chronic venous insufficiency with chronic inflammation especially in the left mid calf  area. The patient has not been systemically unwell no fever or chills. She is reasonably insensate and has not felt any pain 09/04/16; the patient has a fairly substantial wound on the left lateral calcaneus. We have been using Santyl to this area. Blister on the left lower leg appears to of healed the patient also follows with Dr. Sherren Mocha early of vein and vascular. He felt that she should have enough blood flow to heal her left heel wound. He renewed her doxycycline for a further week. She is status post left femoral to anterior tibial bypass in February 2016. 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 She has a remote history of a right BKA. The XRAY that I ordered last week showed no definite evidence of osteomyelitis however a small focus of demineralization was noted in the plantar aspect of the calcaneus which might warrant an MRI possibly indicating early osteomyelitis however this is not in the area of her wound and I'm going to keep this under advisement for now. 09/11/16; still a substantial wound in the left lateral calcaneus we've been using Santyl Woehler, Britini B. ([id number]) 09/18/16; still a substantial wound on the  left lateral calcaneus. We are still using Santyl 09/24/16; still a substantial wound on the left lateral calcaneus. We're using Santyl change one or 2 times a week and mechanical debridement which he comes to [place]. 10/02/16; left lateral calcaneus using Santyl changing 3 times a week. Run Apligraf through her insurance Objective Constitutional Sitting or standing Blood Pressure is within target range for patient.. Pulse regular and within target range for patient.Marland Kitchen Respirations regular, non-labored and within target range.. Temperature is normal and within the target range for the patient.. Patient's appearance is neat and clean. Appears in no acute distress. Well nourished and well developed.. Vitals Time Taken: 3:19 PM, Height: 63 in, Weight: 205 lbs, BMI: 36.3, Temperature: 98.2 F, Pulse: 75 bpm, Respiratory Rate: 18 breaths/min, Blood Pressure: 138/70 mmHg. General Notes: Wound exam; lateral plantar heel. Using a #3 curette again I debrided this area of necrotic surface material. This actually comes off easier this week and the base looks somewhat healthy although still precariously close to bone. Hemostasis with direct pressure. There is no evidence of surrounding subcutaneous infection. Integumentary (Hair, Skin) Wound #2 status is Open. Original cause of wound was Gradually Appeared. The wound is located on the Left,Lateral Calcaneus. The wound measures 2.3cm length x 2.1cm width x 0.3cm depth; 3.793cm^2 area and 1.138cm^3 volume. The wound is limited to skin breakdown. There is no tunneling or undermining noted. There is a large amount of serous drainage noted. The wound margin is flat and intact. There is small (1-33%) red granulation within the wound bed. There is a large (67-100%) amount of necrotic tissue within the wound bed including Eschar and Adherent Slough. The periwound skin appearance exhibited: Maceration. The periwound skin appearance did not exhibit: Callus, Crepitus,  Excoriation, Induration, Rash, Scarring, Dry/Scaly, Atrophie Blanche, Cyanosis, Ecchymosis, Hemosiderin Staining, Mottled, Pallor, Rubor, Erythema. Periwound temperature was noted as No Abnormality. The periwound has tenderness on palpation. Assessment Active Problems ICD-10 E11.621 - Type 2 diabetes mellitus with foot ulcer E11.51 - Type 2 diabetes mellitus with diabetic peripheral angiopathy without gangrene Sedore, Alva B. 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 There was a Skin/Subcutaneous Tissue Debridement [id number]) debridement with total area of 4.83 sq cm performed by Ricard Dillon, MD. with the following instrument(s): Curette to remove Viable and Non-Viable tissue/material including Fibrin/Slough and Subcutaneous after achieving pain control using Lidocaine 4% Topical Solution. A time out was conducted at 15:49, prior to the start of the procedure. A Moderate amount of bleeding was controlled with Pressure. The procedure was tolerated well with a pain level of 0 throughout and a pain level of 0 following the procedure. Post Debridement Measurements: 2.3cm length x 2.1cm width x 0.3cm depth; 1.138cm^3 volume. Character of Wound/Ulcer Post Debridement is improved. Severity of Tissue Post Debridement is: Fat layer exposed. Post procedure Diagnosis Wound #2: Same as Pre-Procedure Plan Wound Cleansing: Wound #2 Left,Lateral Calcaneus: Clean wound with Normal Saline. Anesthetic: Wound #2 Left,Lateral  Calcaneus: Topical Lidocaine 4% cream applied to wound bed prior to debridement Primary Wound Dressing: Wound #2 Left,Lateral Calcaneus: Santyl Ointment Secondary Dressing: Wound #2 Left,Lateral Calcaneus: ABD pad Dry Gauze Dressing Change Frequency: Wound #2 Left,Lateral Calcaneus: Other: - twice weekly - home nurse to change wrap when she visits (may change more than twice weekly if nurse visits more often) Sturdy, Yazmeen B. ([id number]) Follow-up Appointments: Wound #2 Left,Lateral Calcaneus: Return Appointment in 1 week. Edema Control: Wound #2 Left,Lateral Calcaneus: Kerlix and Coban - Left Lower Extremity Off-Loading: Wound #2 Left,Lateral Calcaneus: Other: - open heel shoe Additional Orders / Instructions: Wound #2 Left,Lateral Calcaneus: Increase protein intake. Medications-please add to medication list.: Wound #2 Left,Lateral Calcaneus: Santyl Enzymatic Ointment General Notes: preauthorize for apligraf santyl to continue Sports administrator) Signed: 10/02/2016 4:59:22 PM By: Linton Ham MD Entered By: Linton Ham on 10/02/2016 16:51:32 Kluver, [name] ([id number]) -------------------------------------------------------------------------------- SuperBill Details Patient Name: [name] Date of Service: 10/02/2016 Medical Record Patient Account Number: [id number] Number: Treating RN: Montey Hora November 09, 1940 (76 y.o. Other Clinician: Date of Birth/Sex: Female) Treating Malasha Kleppe, [place] Primary Care Provider: Leighton Ruff Provider/Extender: G Referring Provider: Leighton Ruff Service Line: Outpatient Weeks in Treatment: 6 Diagnosis Coding ICD-10 Codes Code Description E11.621 Type 2 diabetes mellitus with foot ulcer E11.51 Type 2 diabetes mellitus with diabetic peripheral angiopathy without gangrene E11.40 Type 2 diabetes mellitus with diabetic neuropathy, unspecified L97.423 Non-pressure  chronic ulcer of left heel and midfoot with necrosis of muscle I87.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 L97.221 Non-pressure chronic ulcer of left calf limited to breakdown of skin L03.116 Cellulitis of left lower limb Facility Procedures CPT4 Code Description: [id number] - DEB SUBQ TISSUE 20 SQ CM/< ICD-10 Description Diagnosis E11.621 Type 2 diabetes mellitus with foot ulcer L97.423 Non-pressure chronic ulcer of left heel and midfoot Modifier: with necrosis Quantity: 1 of muscle Physician Procedures CPT4 Code Description: [id number] - WC PHYS SUBQ TISS 20 SQ CM ICD-10 Description Diagnosis E11.621 Type 2 diabetes mellitus with foot ulcer L97.423 Non-pressure chronic ulcer of left heel and midfoot Modifier: with necrosis Quantity: 1 of muscle Electronic Signature(s) Signed: 10/02/2016 4:59:22 PM By: Linton Ham MD Entered By: Linton Ham on 10/02/2016 16:51:56

## 2016-10-03 NOTE — Progress Notes (Signed)
FELICIA, NEWCOMB ([id number]) Visit Report for 10/02/2016 Arrival Information Details Patient Name: [name], [name] Date of Service: 10/02/2016 3:30 PM Medical Record Number: [id number] Patient Account Number: [id number] Date of Birth/Sex: [birth year] (76 y.o. Female) Treating RN: Montey Hora Primary Care Shuayb Schepers: Leighton Ruff Other Clinician: Referring Lewanda Perea: Leighton Ruff Treating Katanya Schlie/Extender: Tito Dine in Treatment: 6 Visit Information History Since Last Visit Added or deleted any medications: No Patient Arrived: Wheel Chair Any new allergies or adverse reactions: No Arrival Time: 15:18 Had a fall or experienced change in No Accompanied By: friend activities of daily living that may affect Transfer Assistance: Manual risk of falls: Patient Identification Verified: Yes Signs or symptoms of abuse/neglect since last No Secondary Verification Process Yes visito Completed: Hospitalized since last visit: No Patient Has Alerts: Yes Has Dressing in Place as Prescribed: Yes Patient Alerts: Patient on Blood Has Compression in Place as Prescribed: Yes Thinner Pain Present Now: No eliquis DMII ABI [place] >220 Electronic Signature(s) Signed: 10/02/2016 5:23:30 PM By: Montey Hora Entered By: Montey Hora on 10/02/2016 15:19:24 [name], [name] ([id number]) -------------------------------------------------------------------------------- Encounter Discharge Information Details Patient Name: [name] Date of Service: 10/02/2016 3:30 PM Medical Record Number: [id number] Patient Account Number: [id number] Date of Birth/Sex: 04/14/1941 (76 y.o. Female) Treating RN: Montey Hora Primary Care Carra Brindley: Leighton Ruff Other Clinician: Referring Mikayla Chiusano: Leighton Ruff Treating Abbee Cremeens/Extender: Tito Dine in Treatment: 6 Encounter Discharge Information Items Discharge Pain Level: 0 Discharge Condition:  Stable Ambulatory Status: Wheelchair Discharge Destination: Home Transportation: Private Auto Accompanied By: friend Schedule Follow-up Appointment: Yes Medication Reconciliation completed and provided to Patient/Care No Jylian Pappalardo: Provided on Clinical Summary of Care: 10/02/2016 Form Type Recipient Paper Patient BI Electronic Signature(s) Signed: 10/02/2016 4:36:25 PM By: Montey Hora Previous Signature: 10/02/2016 4:09:55 PM Version By: Ruthine Dose Entered By: Montey Hora on 10/02/2016 16:36:25 [name], [name] ([id number]) -------------------------------------------------------------------------------- Lower Extremity Assessment Details Patient Name: [name] Date of Service: 10/02/2016 3:30 PM Medical Record Number: [id number] Patient Account Number: [id number] Date of Birth/Sex: [birth year] (76 y.o. Female) Treating RN: Montey Hora Primary Care Jaelin Fackler: Leighton Ruff Other Clinician: Referring Jasaun Carn: Leighton Ruff Treating Detrice Cales/Extender: Tito Dine in Treatment: 6 Vascular Assessment Pulses: Dorsalis Pedis Palpable: [Left:Yes] Posterior Tibial Extremity colors, hair growth, and conditions: Extremity Color: [Left:Hyperpigmented] Hair Growth on Extremity: [Left:No] Temperature of Extremity: [Left:Warm] Capillary Refill: [Left:< 3 seconds] Electronic Signature(s) Signed: 10/02/2016 5:23:30 PM By: Montey Hora Entered By: Montey Hora on 10/02/2016 15:36:01 [name], [name] ([id number]) -------------------------------------------------------------------------------- Multi Wound Chart Details Patient Name: [name] Date of Service: 10/02/2016 3:30 PM Medical Record Number: [id number] Patient Account Number: [id number] Date of Birth/Sex: [birth year] (76 y.o. Female) Treating RN: Montey Hora Primary Care Dainel Arcidiacono: Leighton Ruff Other Clinician: Referring Reis Goga: Leighton Ruff Treating  Giannina Bartolome/Extender: Tito Dine in Treatment: 6 Vital Signs Height(in): 63 Pulse(bpm): 75 Weight(lbs): 205 Blood Pressure 138/70 (mmHg): Body Mass Index(BMI): 36 Temperature(F): 98.2 Respiratory Rate 18 (breaths/min): Photos: [N/A:N/A] Wound Location: Left Calcaneus - Lateral N/A N/A Wounding Event: Gradually Appeared N/A N/A Primary Etiology: Diabetic Wound/Ulcer of N/A N/A the Lower Extremity Comorbid History: Anemia, Coronary Artery N/A N/A Disease, Hypertension, Myocardial Infarction, Type II Diabetes, Osteoarthritis, Neuropathy Date Acquired: 08/12/2016 N/A N/A Weeks of Treatment: 6 N/A N/A Wound Status: Open N/A N/A Measurements L x W x D 2.3x2.1x0.3 N/A N/A (cm) Area (cm) : 3.793 N/A N/A Volume (cm) : 1.138 N/A N/A % Reduction in Area: 47.70% N/A N/A % Reduction in Volume: 21.60% N/A N/A  Classification: Grade 1 N/A N/A Exudate Amount: Large N/A N/A Exudate Type: Serous N/A N/A Exudate Color: amber N/A N/A Yes N/A N/A [name], [name] B. ([id number]) Foul Odor After Cleansing: Odor Anticipated Due to No N/A N/A Product Use: Wound Margin: Flat and Intact N/A N/A Granulation Amount: Small (1-33%) N/A N/A Granulation Quality: Red N/A N/A Necrotic Amount: Large (67-100%) N/A N/A Necrotic Tissue: Eschar, Adherent Slough N/A N/A Exposed Structures: Fascia: No N/A N/A Fat Layer (Subcutaneous Tissue) Exposed: No Tendon: No Muscle: No Joint: No Bone: No Limited to Skin Breakdown Epithelialization: None N/A N/A Debridement: Debridement [id number] N/A N/A [id number]) Pre-procedure 15:49 N/A N/A Verification/Time Out Taken: Pain Control: Lidocaine 4% Topical N/A N/A Solution Tissue Debrided: Fibrin/Slough, N/A N/A Subcutaneous Level: Skin/Subcutaneous N/A N/A Tissue Debridement Area (sq 4.83 N/A N/A cm): Instrument: Curette N/A N/A Bleeding: Moderate N/A N/A Hemostasis Achieved: Pressure N/A N/A Procedural Pain: 0 N/A N/A Post Procedural Pain:  0 N/A N/A Debridement Treatment Procedure was tolerated N/A N/A Response: well Post Debridement 2.3x2.1x0.3 N/A N/A Measurements L x W x D (cm) Post Debridement 1.138 N/A N/A Volume: (cm) Periwound Skin Texture: Excoriation: No N/A N/A Induration: No Callus: No Crepitus: No Rash: No Scarring: No [name], [name] B. ([id number]) Periwound Skin Maceration: Yes N/A N/A Moisture: Dry/Scaly: No Periwound Skin Color: Atrophie Blanche: No N/A N/A Cyanosis: No Ecchymosis: No Erythema: No Hemosiderin Staining: No Mottled: No Pallor: No Rubor: No Temperature: No Abnormality N/A N/A Tenderness on Yes N/A N/A Palpation: Wound Preparation: Ulcer Cleansing: N/A N/A Rinsed/Irrigated with Saline Topical Anesthetic Applied: Other: lidocaine 4% Procedures Performed: Debridement N/A N/A Treatment Notes Wound #2 (Left, Lateral Calcaneus) 1. Cleansed with: Clean wound with Normal Saline Cleanse wound with antibacterial soap and water 2. Anesthetic Topical Lidocaine 4% cream to wound bed prior to debridement 4. Dressing Applied: Santyl Ointment 5. Secondary Dressing Applied Guaze, ABD and kerlix/Conform Notes kerlix and coban wrap Electronic Signature(s) Signed: 10/02/2016 4:59:22 PM By: Linton Ham MD Entered By: Linton Ham on 10/02/2016 16:48:27 [name], [name] ([id number]) -------------------------------------------------------------------------------- Multi-Disciplinary Care Plan Details Patient Name: [name] Date of Service: 10/02/2016 3:30 PM Medical Record Number: [id number] Patient Account Number: [id number] Date of Birth/Sex: 08/28/1940 (76 y.o. Female) Treating RN: Montey Hora Primary Care Marli Diego: Leighton Ruff Other Clinician: Referring Dora Simeone: Leighton Ruff Treating Izzabelle Bouley/Extender: Tito Dine in Treatment: 6 Active Inactive ` Abuse / Safety / Falls / Self Care Management Nursing Diagnoses: Potential for  falls Goals: Patient will remain injury free Date Initiated: 08/21/2016 Target Resolution Date: 10/08/2016 Goal Status: Active Interventions: Assess fall risk on admission and as needed Notes: ` Necrotic Tissue Nursing Diagnoses: Impaired tissue integrity related to necrotic/devitalized tissue Goals: Necrotic/devitalized tissue will be minimized in the wound bed Date Initiated: 08/21/2016 Target Resolution Date: 10/08/2016 Goal Status: Active Interventions: Assess patient pain level pre-, during and post procedure and prior to discharge Notes: ` Orientation to the Wound Care Program Nursing Diagnoses: Knowledge deficit related to the wound healing center program [name], [name] ([id number]) Goals: Patient/caregiver will verbalize understanding of the [place] Date Initiated: 08/21/2016 Target Resolution Date: 10/08/2016 Goal Status: Active Interventions: Provide education on orientation to the wound center Notes: ` Peripheral Neuropathy Nursing Diagnoses: Potential alteration in peripheral tissue perfusion (select prior to confirmation of diagnosis) Goals: Patient/caregiver will verbalize understanding of disease process and disease management Date Initiated: 08/21/2016 Target Resolution Date: 10/08/2016 Goal Status: Active Interventions: Assess signs and symptoms of neuropathy upon admission and as needed Notes: ` Venous  Leg Ulcer Nursing Diagnoses: Actual venous Insuffiency (use after diagnosis is confirmed) Goals: Patient will maintain optimal edema control Date Initiated: 08/21/2016 Target Resolution Date: 10/08/2016 Goal Status: Active Interventions: Assess peripheral edema status every visit. Notes: ` Wound/Skin Impairment Nursing Diagnoses: Impaired tissue integrity [name], [name] B. ([id number]) Goals: Patient/caregiver will verbalize understanding of skin care regimen Date Initiated: 08/21/2016 Target Resolution Date:  10/08/2016 Goal Status: Active Ulcer/skin breakdown will have a volume reduction of 30% by week 4 Date Initiated: 08/21/2016 Target Resolution Date: 10/08/2016 Goal Status: Active Ulcer/skin breakdown will have a volume reduction of 50% by week 8 Date Initiated: 08/21/2016 Target Resolution Date: 10/29/2016 Goal Status: Active Ulcer/skin breakdown will have a volume reduction of 80% by week 12 Date Initiated: 08/21/2016 Target Resolution Date: 11/12/2016 Goal Status: Active Ulcer/skin breakdown will heal within 14 weeks Date Initiated: 08/21/2016 Target Resolution Date: 11/12/2016 Goal Status: Active Interventions: Assess patient/caregiver ability to obtain necessary supplies Assess patient/caregiver ability to perform ulcer/skin care regimen upon admission and as needed Assess ulceration(s) every visit Notes: Electronic Signature(s) Signed: 10/02/2016 5:23:30 PM By: Montey Hora Entered By: Montey Hora on 10/02/2016 15:36:11 [name], [name] ([id number]) -------------------------------------------------------------------------------- Pain Assessment Details Patient Name: [name] Date of Service: 10/02/2016 3:30 PM Medical Record Number: [id number] Patient Account Number: [id number] Date of Birth/Sex: [birth year] (76 y.o. Female) Treating RN: Montey Hora Primary Care Jaydon Avina: Leighton Ruff Other Clinician: Referring Takeshia Wenk: Leighton Ruff Treating Benjiman Sedgwick/Extender: Tito Dine in Treatment: 6 Active Problems Location of Pain Severity and Description of Pain Patient Has Paino No Site Locations Pain Management and Medication Current Pain Management: Notes Topical or injectable lidocaine is offered to patient for acute pain when surgical debridement is performed. If needed, Patient is instructed to use over the counter pain medication for the following 24-48 hours after debridement. Wound care MDs do not prescribed pain medications. Patient has  chronic pain or uncontrolled pain. Patient has been instructed to make an appointment with their Primary Care Physician for pain management. Electronic Signature(s) Signed: 10/02/2016 5:23:30 PM By: Montey Hora Entered By: Montey Hora on 10/02/2016 15:19:32 [name], [name] ([id number]) -------------------------------------------------------------------------------- Patient/Caregiver Education Details Patient Name: [name] Date of Service: 10/02/2016 3:30 PM Medical Record Patient Account Number: [id number] Number: Treating RN: Montey Hora 1940/12/11 (76 y.o. Other Clinician: Date of Birth/Gender: Female) Treating ROBSON, MICHAEL Primary Care Physician: Leighton Ruff Physician/Extender: G Referring Physician: Bethanie Dicker in Treatment: 6 Education Assessment Education Provided To: Patient and Caregiver Education Topics Provided Wound/Skin Impairment: Handouts: Other: wound care as ordered Methods: Demonstration, Explain/Verbal Responses: State content correctly Electronic Signature(s) Signed: 10/02/2016 5:23:30 PM By: Montey Hora Entered By: Montey Hora on 10/02/2016 16:36:54 [name], [name] ([id number]) -------------------------------------------------------------------------------- Wound Assessment Details Patient Name: [name] Date of Service: 10/02/2016 3:30 PM Medical Record Number: [id number] Patient Account Number: [id number] Date of Birth/Sex: [birth year] (76 y.o. Female) Treating RN: Montey Hora Primary Care Alveda Vanhorne: Leighton Ruff Other Clinician: Referring Adysson Revelle: Leighton Ruff Treating Jacques Willingham/Extender: Tito Dine in Treatment: 6 Wound Status Wound Number: 2 Primary Diabetic Wound/Ulcer of the Lower Etiology: Extremity Wound Location: Left Calcaneus - Lateral Wound Open Wounding Event: Gradually Appeared Status: Date Acquired: 08/12/2016 Comorbid Anemia, Coronary Artery  Disease, Weeks Of Treatment: 6 History: Hypertension, Myocardial Infarction, Clustered Wound: No Type II Diabetes, Osteoarthritis, Neuropathy Photos Wound Measurements Length: (cm) 2.3 Width: (cm) 2.1 Depth: (cm) 0.3 Area: (cm) 3.793 Volume: (cm) 1.138 % Reduction in Area: 47.7% % Reduction in Volume: 21.6% Epithelialization: None Tunneling: No  Undermining: No Wound Description Classification: Grade 1 Foul Odor Afte Wound Margin: Flat and Intact Due to Product Exudate Amount: Large Slough/Fibrino Exudate Type: Serous Exudate Color: amber r Cleansing: Yes Use: No Yes Wound Bed Granulation Amount: Small (1-33%) Exposed Structure Granulation Quality: Red Fascia Exposed: No Necrotic Amount: Large (67-100%) Fat Layer (Subcutaneous Tissue) Exposed: No Necrotic Quality: Eschar, Adherent Slough Tendon Exposed: No [place]ott, Nakeysha B. ([id number]) Muscle Exposed: No Joint Exposed: No Bone Exposed: No Limited to Skin Breakdown Periwound Skin Texture Texture Color No Abnormalities Noted: No No Abnormalities Noted: No Callus: No Atrophie Blanche: No Crepitus: No Cyanosis: No Excoriation: No Ecchymosis: No Induration: No Erythema: No Rash: No Hemosiderin Staining: No Scarring: No Mottled: No Pallor: No Moisture Rubor: No No Abnormalities Noted: No Dry / Scaly: No Temperature / Pain Maceration: Yes Temperature: No Abnormality Tenderness on Palpation: Yes Wound Preparation Ulcer Cleansing: Rinsed/Irrigated with Saline Topical Anesthetic Applied: Other: lidocaine 4%, Treatment Notes Wound #2 (Left, Lateral Calcaneus) 1. Cleansed with: Clean wound with Normal Saline Cleanse wound with antibacterial soap and water 2. Anesthetic Topical Lidocaine 4% cream to wound bed prior to debridement 4. Dressing Applied: Santyl Ointment 5. Secondary Dressing Applied Guaze, ABD and kerlix/Conform Notes kerlix and coban wrap Electronic Signature(s) Signed: 10/02/2016  5:23:30 PM By: Montey Hora Entered By: Montey Hora on 10/02/2016 15:34:37 [place], [name] ([id number]) -------------------------------------------------------------------------------- Vitals Details Patient Name: [name] Date of Service: 10/02/2016 3:30 PM Medical Record Number: [id number] Patient Account Number: [id number] Date of Birth/Sex: [birth year] (76 y.o. Female) Treating RN: Montey Hora Primary Care Jamiah Recore: Leighton Ruff Other Clinician: Referring Meiling Hendriks: Leighton Ruff Treating Renesmay Nesbitt/Extender: Tito Dine in Treatment: 6 Vital Signs Time Taken: 15:19 Temperature (F): 98.2 Height (in): 63 Pulse (bpm): 75 Weight (lbs): 205 Respiratory Rate (breaths/min): 18 Body Mass Index (BMI): 36.3 Blood Pressure (mmHg): 138/70 Reference Range: 80 - 120 mg / dl Electronic Signature(s) Signed: 10/02/2016 5:23:30 PM By: Montey Hora Entered By: Montey Hora on 10/02/2016 15:19:55

## 2016-10-04 ENCOUNTER — Other Ambulatory Visit: Payer: Self-pay | Admitting: *Deleted

## 2016-10-04 ENCOUNTER — Other Ambulatory Visit: Payer: Self-pay | Admitting: Vascular Surgery

## 2016-10-04 NOTE — Patient Outreach (Signed)
[name]) Care Management   10/04/2016  [name] 1941/03/26 [id number]  [name] is an 76 y.o. female  Subjective: Pt has admitted to [facility] Pharmacist that she alters her Lantus dose and her Nolovolog as she feels she needs to. She was counseled on taking the Lantus fixed doses every day 55 in am and 60 at night. Today she explains why she has done this and she admits she didn't understand that the Lantus would not make her blood sugar drop dramatically.  Still going to wound center. She is also doing her dressing twice a week. She reports Dr. Dellia [name] has been talking about doing a graft.  Objective:   Review of Systems  Constitutional: Negative.   HENT: Negative.   Eyes:       Poor vision.  Respiratory: Negative.   Cardiovascular: Positive for leg swelling.       Minimized however with the leg wrap.  Gastrointestinal: Negative.   Genitourinary: Negative.   Musculoskeletal: Negative.   Skin: Negative.   Neurological: Negative.   Endo/Heme/Allergies: Negative.   Psychiatric/Behavioral: Negative.    BP (!) 160/66 (BP Location: Right Arm, Patient Position: Sitting, Cuff Size: Normal)   Pulse (!) 58   Resp 16   SpO2 98%   FBS: 156  Physical Exam  Constitutional: She is oriented to person, place, and time. She appears well-developed and well-nourished.  Neck: Normal range of motion.  Cardiovascular: Normal rate, regular rhythm and normal heart sounds.   Respiratory: Effort normal and breath sounds normal.  GI: Soft. Bowel sounds are normal.  Neurological: She is alert and oriented to person, place, and time.  Skin: Skin is warm and dry.  L heel wound with dsg intact.  Psychiatric: She has a normal mood and affect.    Encounter Medications:   Outpatient Encounter Prescriptions as of 10/04/2016  Medication Sig Note  . acetaminophen (TYLENOL) 500 MG tablet Take 1,000 mg by mouth every 6 (six) hours as needed for mild pain.  08/23/2016: PRN ONLY   . amiodarone (PACERONE) 200 MG tablet Take 1 tablet (200 mg total) by mouth daily. OVERDUE FOR FOLLOW UP. CALL AND SCHEDULE [phone]   . apixaban (ELIQUIS) 5 MG TABS tablet Take 1 tablet (5 mg total) by mouth 2 (two) times daily.   Marland Kitchen atorvastatin (LIPITOR) 80 MG tablet TAKE 1 TABLET (80 MG TOTAL) BY MOUTH EVERY EVENING.   . calcitRIOL (ROCALTROL) 0.25 MCG capsule Take 1 capsule by mouth daily.   . collagenase (SANTYL) ointment Apply 1 application topically daily.   . furosemide (LASIX) 40 MG tablet Take 80 mg by mouth 2 (two) times daily. Takes all doses as often as she can.    . insulin glargine (LANTUS) 100 UNIT/ML injection Inject 0.3 mLs (30 Units total) into the skin 2 (two) times daily. 55 units in the morning and 60 units at night   . isosorbide mononitrate (IMDUR) 30 MG 24 hr tablet Take 1 tablet (30 mg total) by mouth daily. Please call and schedule follow up appt for further refills [phone]   . Magnesium 400 MG TABS Take 400 mg by mouth daily.    . Melatonin 10 MG TABS Take 10 mg by mouth at bedtime.  10/01/2016: Has not taken in the last month  . metoprolol succinate (TOPROL-XL) 100 MG 24 hr tablet TAKE 1 TABLET (100 MG TOTAL) BY MOUTH EVERY EVENING. TAKE WITH OR IMMEDIATELY FOLLOWING A MEAL.   . Multiple Vitamins-Minerals (PRESERVISION AREDS PO)  Take 1 capsule by mouth 2 (two) times daily.   . nitroGLYCERIN (NITROSTAT) 0.6 MG SL tablet Take 0.6 mg by mouth every 5 (five) minutes x 3 doses as needed.   Marland Kitchen NOVOLOG FLEXPEN 100 UNIT/ML FlexPen Use per sliding scale 09/03/2016: Patient does not have a sliding scale at home to use as a guide for dosing.  Marland Kitchen omeprazole (PRILOSEC) 20 MG capsule Take 20 mg by mouth every evening.    . polyethylene glycol (MIRALAX / GLYCOLAX) packet Take 17 g by mouth as needed. 08/23/2016: ONLY USES PRN  . potassium chloride SA (K-DUR,KLOR-CON) 20 MEQ tablet Take 20 mEq by mouth daily.    Marland Kitchen senna (SENOKOT) 8.6 MG TABS tablet Take 4 tablets by mouth at  bedtime as needed (([employer])). Takes 1 tab daily and can take addt'l tab as needed for constipation   . sodium bicarbonate 650 MG tablet Take 1,300 mg by mouth 2 (two) times daily.    No facility-administered encounter medications on file as of 10/04/2016.     Functional Status:   In your present state of health, do you have any difficulty performing the following activities: 08/16/2016 11/24/2015  Hearing? N N  Vision? Y N  Difficulty concentrating or making decisions? N N  Walking or climbing stairs? Y Y  Dressing or bathing? Y Y  Doing errands, shopping? Y -  Conservation officer, nature and eating ? N -  Using the Toilet? N -  In the past six months, have you accidently leaked urine? N -  Do you have problems with loss of bowel control? Y -  Managing your Medications? N -  Managing your Finances? N -  Housekeeping or managing your Housekeeping? Y -  Some recent data might be hidden    Fall/Depression Screening:    PHQ 2/9 Scores 08/16/2016  PHQ - 2 Score 4  PHQ- 9 Score 8    Assessment:  Diabetes - poor control, poor understanding of Lantus action.  Plan: Discussed Insulin regimen and how to take them: Lantus 55 in am consistently and 60 in pm. I gave her the following Novolog sliding scale: 151-200 - 5 units, 201-250 - 10 units, 251-300 - 15 units and greater than 300 - 20 units.  I will call pt to see how this regimen worked over the weekend.  I have some questions about times she takes some of her other meds that I will direct to our pharmacy team.  DR. Drema Dallas DO YOU HAVE ADVANCED DIRECTIVES FOR [name]??? We completed a MOST for today. She does not want to be rescusitated.  Also, please order an electric wheel chair evaluation. She really needs this! She will see you next Friday!!!  [name] [facility] [place] [phone]

## 2016-10-04 NOTE — Patient Outreach (Signed)
Telephone call to pt. [name] is doing fair. She has agreed to a home visit tomorrow for diabetes medication compliance discussion.  [name] [facility] [place] [phone]

## 2016-10-09 ENCOUNTER — Encounter: Payer: PPO | Admitting: Internal Medicine

## 2016-10-09 ENCOUNTER — Ambulatory Visit: Payer: PPO | Admitting: Pharmacist

## 2016-10-09 DIAGNOSIS — E11621 Type 2 diabetes mellitus with foot ulcer: Secondary | ICD-10-CM | POA: Diagnosis not present

## 2016-10-09 DIAGNOSIS — L97422 Non-pressure chronic ulcer of left heel and midfoot with fat layer exposed: Secondary | ICD-10-CM | POA: Diagnosis not present

## 2016-10-10 ENCOUNTER — Other Ambulatory Visit: Payer: Self-pay | Admitting: Pharmacist

## 2016-10-10 ENCOUNTER — Encounter: Payer: Self-pay | Admitting: Family

## 2016-10-10 NOTE — Progress Notes (Signed)
[name], [name] ([id number]) Visit Report for 10/09/2016 Chief Complaint Document Details Patient Name: [name], [name] Date of Service: 10/09/2016 11:00 AM Medical Record Patient Account Number: [id number] Number: Treating RN: [name] 28-Aug-1940 (76 y.o. Other Clinician: Date of Birth/Sex: Female) Treating [name] Primary Care Provider: Leighton [name] Provider/Extender: [name] Referring Provider: Bethanie [name] in Treatment: 7 Information Obtained from: Patient Chief Complaint Patient is here for review of a wound on the left heel Electronic Signature(s) Signed: 10/09/2016 3:56:26 PM By: [name] Ham MD Entered By: [name] on 10/09/2016 12:06:52 [place], [name] ([id number]) -------------------------------------------------------------------------------- Debridement Details Patient Name: [name] Date of Service: 10/09/2016 11:00 AM Medical Record Patient Account Number: [id number] Number: Treating RN: [name] 04/21/1941 (76 y.o. Other Clinician: Date of Birth/Sex: Female) Treating [name], [place] Primary Care Provider: Leighton [name] Provider/Extender: [name] Referring Provider: Bethanie [name] in Treatment: 7 Debridement Performed for Wound #2 Left,Lateral Calcaneus Assessment: Performed By: Physician [name] Dillon, MD Debridement: Debridement Pre-procedure Yes - 11:17 Verification/Time Out Taken: Start Time: 11:17 Pain Control: Lidocaine 4% Topical Solution Level: Skin/Subcutaneous Tissue Total Area Debrided (L x 2.3 (cm) x 2.1 (cm) = 4.83 (cm) W): Tissue and other Viable, Non-Viable, Fibrin/Slough, Subcutaneous material debrided: Instrument: Curette Bleeding: Moderate Hemostasis Achieved: Pressure End Time: 11:21 Procedural Pain: 0 Post Procedural Pain: 0 Response to Treatment: Procedure was tolerated well Post Debridement Measurements of Total Wound Length: (cm) 2.3 Width:  (cm) 2.1 Depth: (cm) 0.4 Volume: (cm) 1.517 Character of Wound/Ulcer Post Improved Debridement: Severity of Tissue Post Debridement: Fat layer exposed Post Procedure Diagnosis Same as Pre-procedure Electronic Signature(s) Signed: 10/09/2016 3:56:26 PM By: [name] Ham MD Signed: 10/09/2016 4:29:36 PM By: [name], [name] ([id number]) Entered By: [name] on 10/09/2016 12:06:09 [name], [name] ([id number]) -------------------------------------------------------------------------------- HPI Details Patient Name: [name] Date of Service: 10/09/2016 11:00 AM Medical Record Patient Account Number: [id number] Number: Treating RN: [name] 11/08/1940 (76 y.o. Other Clinician: Date of Birth/Sex: Female) Treating Aaban Griep Primary Care Provider: Leighton [name] Provider/Extender: [name] Referring Provider: Bethanie [name] in Treatment: 7 History of Present Illness HPI Description: 08/21/16; [name] is a 76 year old woman who I cared for in our [facility] clinic up until January 2016. At that point she had a wound in her left great toe. 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 He follows with vein and vascular and has a follow-up appointment in early February. She is status left femoral to anterior tibial bypass in February 2016. She is also noted to have venous stasis changes and a history of ulcers. 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 fib on Eliquis. She has bladder cancer and has a chronic urostomy. The patient tells me that 2 weeks ago she had a right prosthesis and a new left diabetic shoe. Roughly one week ago she  noted a wound on her left lateral heel. She zinc oxide and some silver alginate she had left over until she could be seen here. It was also noted that she has had blistering on her posterior left calf and an open area here as well. Her records note that she has chronic venous insufficiency with  chronic inflammation especially in the left mid calf area. The patient has not been systemically unwell no fever or chills. She is reasonably insensate and has not felt any pain 09/04/16; the patient has a fairly substantial wound on the left lateral calcaneus. We have been using Santyl to this area. Blister on the left lower leg appears to of healed the patient also follows with Dr. Sherren Mocha early of vein and vascular. He felt that she should have enough blood flow to heal her left heel wound. He renewed her doxycycline for a further week. She is status post left femoral to anterior tibial bypass in February 2016. 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 She has a remote history of a right BKA. The XRAY that I ordered last week showed no definite evidence of osteomyelitis however a small focus of demineralization was noted in the plantar aspect of the calcaneus which might warrant an MRI possibly indicating early osteomyelitis however this is not in the area of her wound and I'm going to keep this under advisement for now. 09/11/16; still a substantial wound in the left lateral calcaneus we've been using Santyl 09/18/16; still a substantial wound on the left lateral calcaneus. We are still using Santyl 09/24/16; still a substantial wound on the left lateral calcaneus. We're using Santyl change one or 2 times a week and mechanical debridement which he comes to [place]. 10/02/16; left lateral calcaneus using Santyl changing 3 times a week. Run Apligraf through her insurance [employer] [place] [employer] ([id number]) 10/09/16; still using Santyl without much change. Being changed 2 times a week by the patient and once here.  We did not run Apligraf last week we'll do that this week Electronic Signature(s) Signed: 10/09/2016 3:56:26 PM By: [name] Ham MD Entered By: [name] on 10/09/2016 12:07:35 [name], [name] ([id number]) -------------------------------------------------------------------------------- Physical Exam Details Patient Name: [name] Date of Service: 10/09/2016 11:00 AM Medical Record Patient Account Number: [id number] Number: Treating RN: [name] 12/01/1940 (76 y.o. Other Clinician: Date of Birth/Sex: Female) Treating Shabnam Ladd Primary Care Provider: Leighton [name] Provider/Extender: [name] Referring Provider: Bethanie [name] in Treatment: 7 Constitutional Patient is hypertensive.. Pulse regular and within target range for patient.Marland Kitchen Respirations regular, non-labored and within target range.. Temperature is normal and within the target range for the patient.. Patient's appearance is neat and clean. Appears in no acute distress. Well nourished and well developed.. Notes Wound exam; lateral plantar heel. Still requiring debridement of copious amounts of necrotic surface material this is somewhat better than last week. Hemostasis with direct pressure. There is no evidence of circumferential infection Electronic Signature(s) Signed: 10/09/2016 3:56:26 PM By: [name] Ham MD Entered By: [name] on 10/09/2016 12:09:27 [name] ([id number]) -------------------------------------------------------------------------------- Physician Orders Details Patient Name: [name] Date of Service: 10/09/2016 11:00 AM Medical Record Patient Account Number: [id number] Number: Treating RN: [name] 01/04/1941 (76 y.o. Other Clinician: Date of Birth/Sex: Female) Treating Kell Ferris Primary Care Provider: Leighton [name] Provider/Extender: [name] Referring Provider: Bethanie [name] in Treatment: 7 Verbal  / Phone Orders: No Diagnosis Coding Wound Cleansing Wound #2 Left,Lateral Calcaneus o Clean wound with Normal Saline. Anesthetic Wound #2 Left,Lateral Calcaneus o Topical Lidocaine 4% cream applied to wound bed prior to debridement Primary Wound Dressing Wound #2 Left,Lateral Calcaneus o Iodoflex Secondary Dressing Wound #2 Left,Lateral Calcaneus o ABD pad o Dry Gauze Dressing Change Frequency Wound #2 Left,Lateral Calcaneus o Other: - twice weekly - home nurse to change wrap when she  visits (may change more than twice weekly if nurse visits more often) Follow-up Appointments Wound #2 Left,Lateral Calcaneus o Return Appointment in 1 week. Edema Control Wound #2 Left,Lateral Calcaneus o Kerlix and Coban - Left Lower Extremity Off-Loading Wound #2 Left,Lateral Calcaneus o Other: - open heel shoe [name], [name] B. ([id number]) Additional Orders / Instructions Wound #2 Left,Lateral Calcaneus o Increase protein intake. Medications-please add to medication list. Wound #2 Left,Lateral Calcaneus o Santyl Enzymatic Ointment Electronic Signature(s) Signed: 10/09/2016 3:56:26 PM By: [name] Ham MD Signed: 10/09/2016 4:29:36 PM By: [name] Entered By: [name] on 10/09/2016 11:21:46 [place], [name] ([id number]) -------------------------------------------------------------------------------- Problem List Details Patient Name: [name] Date of Service: 10/09/2016 11:00 AM Medical Record Patient Account Number: [id number] Number: Treating RN: [name] [birth year] (76 y.o. Other Clinician: Date of Birth/Sex: Female) Treating Seleena Reimers Primary Care Provider: Leighton [name] Provider/Extender: [name] Referring Provider: Bethanie [name] in Treatment: 7 Active Problems ICD-10 Encounter Code Description Active Date Diagnosis E11.621 Type 2 diabetes mellitus with foot ulcer 08/21/2016 Yes E11.51 Type 2  diabetes mellitus with diabetic peripheral 08/21/2016 Yes angiopathy without gangrene E11.40 Type 2 diabetes mellitus with diabetic neuropathy, 08/21/2016 Yes unspecified L97.423 Non-pressure chronic ulcer of left heel and midfoot with 08/21/2016 Yes necrosis of muscle I87.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 hypertension (idiopathic) with 08/21/2016 Yes inflammation of left lower extremity L97.221 Non-pressure chronic ulcer of left calf limited to 08/21/2016 Yes breakdown of skin L03.116 Cellulitis of left lower limb 08/21/2016 Yes Inactive Problems Resolved Problems Electronic Signature(s) [name], [name] ([id number]) Signed: 10/09/2016 3:56:26 PM By: [name] Ham MD Entered By: [name] on 10/09/2016 12:05:50 [name], [name] ([id number]) -------------------------------------------------------------------------------- Progress Note Details Patient Name: [name] Date of Service: 10/09/2016 11:00 AM Medical Record Patient Account Number: [id number] Number: Treating RN: [name] January 15, 1941 (76 y.o. Other Clinician: Date of Birth/Sex: Female) Treating Jozee Hammer Primary Care Provider: Leighton [name] Provider/Extender: [name] Referring Provider: Bethanie [name] in Treatment: 7 Subjective Chief Complaint Information obtained from Patient Patient is here for review of a wound on the left heel History of Present Illness (HPI) 08/21/16; [name] is a 76 year old woman who I cared for in our [facility] clinic up until January 2016. At that point she had a wound in her left great toe. 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 He follows with vein and vascular and has a follow-up appointment in early February. She is status left femoral to anterior tibial bypass in February 2016. She is also noted  to have venous stasis changes and a history of ulcers. 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 fib on Eliquis. She has bladder cancer and has a chronic urostomy. The patient tells me that 2 weeks ago she had a right prosthesis and a new left diabetic shoe. Roughly one week ago she noted a wound on her left lateral heel. She zinc oxide and some silver alginate she had left over until she could be seen here. It was also noted that she has had blistering on her posterior left calf and an open area here as well. Her records note that she has chronic venous insufficiency with chronic inflammation especially in the left  mid calf area. The patient has not been systemically unwell no fever or chills. She is reasonably insensate and has not felt any pain 09/04/16; the patient has a fairly substantial wound on the left lateral calcaneus. We have been using Santyl to this area. Blister on the left lower leg appears to of healed the patient also follows with Dr. Sherren Mocha early of vein and vascular. He felt that she should have enough blood flow to heal her left heel wound. He renewed her doxycycline for a further week. She is status post left femoral to anterior tibial bypass in February 2016. 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 She has a remote history of a right BKA. The XRAY that I ordered last week showed no definite evidence of osteomyelitis however a small focus of demineralization was noted in the plantar aspect of the calcaneus which might warrant an MRI possibly indicating early osteomyelitis however this is not in the area of her wound and I'm going to keep this under advisement for now. 09/11/16; still a substantial wound in the left lateral calcaneus we've been using Santyl Batiz, [name] B. ([id number]) 09/18/16; still a substantial wound on the left  lateral calcaneus. We are still using Santyl 09/24/16; still a substantial wound on the left lateral calcaneus. We're using Santyl change one or 2 times a week and mechanical debridement which he comes to [place]. 10/02/16; left lateral calcaneus using Santyl changing 3 times a week. Run Apligraf through her insurance 10/09/16; still using Santyl without much change. Being changed 2 times a week by the patient and once here. We did not run Apligraf last week we'll do that this week Objective Constitutional Patient is hypertensive.. Pulse regular and within target range for patient.Marland Kitchen Respirations regular, non-labored and within target range.. Temperature is normal and within the target range for the patient.. Patient's appearance is neat and clean. Appears in no acute distress. Well nourished and well developed.. Vitals Time Taken: 10:59 AM, Height: 63 in, Weight: 205 lbs, BMI: 36.3, Temperature: 98.3 F, Pulse: 75 bpm, Respiratory Rate: 18 breaths/min, Blood Pressure: 158/57 mmHg. General Notes: Wound exam; lateral plantar heel. Still requiring debridement of copious amounts of necrotic surface material this is somewhat better than last week. Hemostasis with direct pressure. There is no evidence of circumferential infection Integumentary (Hair, Skin) Wound #2 status is Open. Original cause of wound was Gradually Appeared. The wound is located on the Left,Lateral Calcaneus. The wound measures 2.3cm length x 2.1cm width x 0.3cm depth; 3.793cm^2 area and 1.138cm^3 volume. The wound is limited to skin breakdown. There is no tunneling or undermining noted. There is a large amount of serous drainage noted. The wound margin is flat and intact. There is small (1-33%) red granulation within the wound bed. There is a large (67-100%) amount of necrotic tissue within the wound bed including Eschar and Adherent Slough. The periwound skin appearance exhibited: Maceration. The periwound skin appearance did not  exhibit: Callus, Crepitus, Excoriation, Induration, Rash, Scarring, Dry/Scaly, Atrophie Blanche, Cyanosis, Ecchymosis, Hemosiderin Staining, Mottled, Pallor, Rubor, Erythema. Periwound temperature was noted as No Abnormality. The periwound has tenderness on palpation. Assessment Active Problems ICD-10 E11.621 - Type 2 diabetes mellitus with foot ulcer E11.51 - Type 2 diabetes mellitus with diabetic peripheral angiopathy without gangrene [name], [name] B. 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 There was a Skin/Subcutaneous Tissue Debridement [id number]) debridement with total area of 4.83 sq cm performed by [name] Dillon, MD. with the following instrument(s): Curette to remove Viable and Non-Viable tissue/material including Fibrin/Slough and Subcutaneous after achieving pain control using Lidocaine 4% Topical Solution. A time out was conducted at 11:17, prior to the start of the procedure. A Moderate amount of bleeding was controlled with Pressure. The procedure was tolerated well with a pain level of 0 throughout and a pain level of 0 following the procedure. Post Debridement Measurements: 2.3cm length x 2.1cm width x 0.4cm depth; 1.517cm^3 volume. Character of Wound/Ulcer Post Debridement is improved. Severity of Tissue Post Debridement is: Fat layer exposed. Post procedure Diagnosis Wound #2: Same as Pre-Procedure Plan Wound Cleansing: Wound #2 Left,Lateral Calcaneus: Clean wound with Normal Saline. Anesthetic: Wound #2  Left,Lateral Calcaneus: Topical Lidocaine 4% cream applied to wound bed prior to debridement Primary Wound Dressing: Wound #2 Left,Lateral Calcaneus: Iodoflex Secondary Dressing: Wound #2 Left,Lateral Calcaneus: ABD pad Dry Gauze Dressing Change Frequency: Wound #2 Left,Lateral Calcaneus: Other: - twice weekly - home nurse to change wrap when she visits (may change more than twice weekly if nurse visits more often) [name], [name] B. ([id number]) Follow-up Appointments: Wound #2 Left,Lateral Calcaneus: Return Appointment in 1 week. Edema Control: Wound #2 Left,Lateral Calcaneus: Kerlix and Coban - Left Lower Extremity Off-Loading: Wound #2 Left,Lateral Calcaneus: Other: - open heel shoe Additional Orders / Instructions: Wound #2 Left,Lateral Calcaneus: Increase protein intake. Medications-please add to medication list.: Wound #2 Left,Lateral Calcaneus: Santyl Enzymatic Ointment o #1 run Apligraf through the insurance #2 change the primary dressing to Iodoflex to see if we can address the recurrent necrotic surface #3 she is followed by pain and vascular Dr. Tawni Millers who felt there was enough blood flow to this area. #4 post debridement the surface looks quite good. No evidence of surrounding infection. #5 ill need to review the imaging we've none of this foot before the next visit Electronic Signature(s) Signed: 10/09/2016 3:56:26 PM By: [name] Ham MD Entered By: [name] on 10/09/2016 12:12:18 [name], [name] ([id number]) -------------------------------------------------------------------------------- SuperBill Details Patient Name: [name] Date of Service: 10/09/2016 Medical Record Patient Account Number: [id number] Number: Treating RN: [name] 08/08/1941 (76 y.o. 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 I87.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 L97.221 Non-pressure chronic ulcer of left calf limited to breakdown of skin L03.116 Cellulitis of left lower limb Facility Procedures CPT4 Code Description: [id number] - DEB SUBQ TISSUE 20 SQ CM/< ICD-10 Description Diagnosis L97.423 Non-pressure chronic ulcer of left heel and midfoot Modifier: with necrosis Quantity: 1 of muscle Physician Procedures CPT4 Code Description: [id number] 11042 - WC PHYS SUBQ TISS 20 SQ CM ICD-10 Description Diagnosis L97.423 Non-pressure chronic ulcer of left heel and midfoot Modifier: with necrosis Quantity: 1 of muscle Electronic Signature(s) Signed:  10/09/2016 3:56:26 PM By: [name] Ham MD Entered By: [name] on 10/09/2016 12:12:49

## 2016-10-10 NOTE — Progress Notes (Signed)
[name], [name] ([id number]) Visit Report for 10/09/2016 Arrival Information Details Patient Name: [name], [name] Date of Service: 10/09/2016 11:00 AM Medical Record Number: [id number] Patient Account Number: [id number] Date of Birth/Sex: [birth year] (76 y.o. Female) Treating RN: Montey Hora Primary Care Rajan Burgard: Leighton Ruff Other Clinician: Referring Chinedu Agustin: Leighton Ruff Treating Wadie Liew/Extender: Tito Dine in Treatment: 7 Visit Information History Since Last Visit Added or deleted any medications: No Patient Arrived: Wheel Chair Any new allergies or adverse reactions: No Arrival Time: 10:58 Had a fall or experienced change in No Accompanied By: friend activities of daily living that may affect Transfer Assistance: None risk of falls: Patient Identification Verified: Yes Signs or symptoms of abuse/neglect since last No Secondary Verification Process Yes visito Completed: Hospitalized since last visit: No Patient Has Alerts: Yes Has Dressing in Place as Prescribed: Yes Patient Alerts: Patient on Blood Has Compression in Place as Prescribed: Yes Thinner Pain Present Now: No eliquis DMII ABI [place] >220 Electronic Signature(s) Signed: 10/09/2016 4:29:36 PM By: Montey Hora Entered By: Montey Hora on 10/09/2016 10:58:58 [name] ([id number]) -------------------------------------------------------------------------------- Encounter Discharge Information Details Patient Name: [name] Date of Service: 10/09/2016 11:00 AM Medical Record Number: [id number] Patient Account Number: [id number] Date of Birth/Sex: 07/25/1941 (76 y.o. Female) Treating RN: Montey Hora Primary Care Irasema Chalk: Leighton Ruff Other Clinician: Referring Monya Kozakiewicz: Leighton Ruff Treating Mishon Blubaugh/Extender: Tito Dine in Treatment: 7 Encounter Discharge Information Items Discharge Pain Level: 0 Discharge Condition:  Stable Ambulatory Status: Wheelchair Discharge Destination: Home Transportation: Private Auto Accompanied By: friend Schedule Follow-up Appointment: Yes Medication Reconciliation completed and provided to Patient/Care No Dusty Wagoner: Provided on Clinical Summary of Care: 10/09/2016 Form Type Recipient Paper Patient BI Electronic Signature(s) Signed: 10/09/2016 11:40:47 AM By: Ruthine Dose Entered By: Ruthine Dose on 10/09/2016 11:40:46 [name] ([id number]) -------------------------------------------------------------------------------- Lower Extremity Assessment Details Patient Name: [name] Date of Service: 10/09/2016 11:00 AM Medical Record Number: [id number] Patient Account Number: [id number] Date of Birth/Sex: [birth year] (76 y.o. Female) Treating RN: Montey Hora Primary Care Aboubacar Matsuo: Leighton Ruff Other Clinician: Referring Venetta Knee: Leighton Ruff Treating Ammar Moffatt/Extender: Tito Dine in Treatment: 7 Edema Assessment Assessed: [Left: No] [Right: No] Edema: [Left: N] [Right: o] Vascular Assessment Pulses: Dorsalis Pedis Palpable: [Left:Yes] Posterior Tibial Extremity colors, hair growth, and conditions: Extremity Color: [Left:Hyperpigmented] Hair Growth on Extremity: [Left:No] Temperature of Extremity: [Left:Warm] Capillary Refill: [Left:< 3 seconds] Electronic Signature(s) Signed: 10/09/2016 4:29:36 PM By: Montey Hora Entered By: Montey Hora on 10/09/2016 11:22:23 [name] ([id number]) -------------------------------------------------------------------------------- Multi Wound Chart Details Patient Name: [name] Date of Service: 10/09/2016 11:00 AM Medical Record Number: [id number] Patient Account Number: [id number] Date of Birth/Sex: 10-26-1940 (76 y.o. Female) Treating RN: Montey Hora Primary Care Kerem Gilmer: Leighton Ruff Other Clinician: Referring Lillyauna Jenkinson: Leighton Ruff Treating  Bach Rocchi/Extender: Tito Dine in Treatment: 7 Vital Signs Height(in): 63 Pulse(bpm): 75 Weight(lbs): 205 Blood Pressure 158/57 (mmHg): Body Mass Index(BMI): 36 Temperature(F): 98.3 Respiratory Rate 18 (breaths/min): Photos: [N/A:N/A] Wound Location: Left Calcaneus - Lateral N/A N/A Wounding Event: Gradually Appeared N/A N/A Primary Etiology: Diabetic Wound/Ulcer of N/A N/A the Lower Extremity Comorbid History: Anemia, Coronary Artery N/A N/A Disease, Hypertension, Myocardial Infarction, Type II Diabetes, Osteoarthritis, Neuropathy Date Acquired: 08/12/2016 N/A N/A Weeks of Treatment: 7 N/A N/A Wound Status: Open N/A N/A Measurements L x W x D 2.3x2.1x0.3 N/A N/A (cm) Area (cm) : 3.793 N/A N/A Volume (cm) : 1.138 N/A N/A % Reduction in Area: 47.70% N/A N/A % Reduction in Volume:  21.60% N/A N/A Classification: Grade 1 N/A N/A Exudate Amount: Large N/A N/A Exudate Type: Serous N/A N/A Exudate Color: amber N/A N/A Yes N/A N/A [name], [name] ([id number]) Foul Odor After Cleansing: Odor Anticipated Due to No N/A N/A Product Use: Wound Margin: Flat and Intact N/A N/A Granulation Amount: Small (1-33%) N/A N/A Granulation Quality: Red N/A N/A Necrotic Amount: Large (67-100%) N/A N/A Necrotic Tissue: Eschar, Adherent Slough N/A N/A Exposed Structures: Fascia: No N/A N/A Fat Layer (Subcutaneous Tissue) Exposed: No Tendon: No Muscle: No Joint: No Bone: No Limited to Skin Breakdown Epithelialization: None N/A N/A Debridement: Debridement [id number] N/A N/A [id number]) Pre-procedure 11:17 N/A N/A Verification/Time Out Taken: Pain Control: Lidocaine 4% Topical N/A N/A Solution Tissue Debrided: Fibrin/Slough, N/A N/A Subcutaneous Level: Skin/Subcutaneous N/A N/A Tissue Debridement Area (sq 4.83 N/A N/A cm): Instrument: Curette N/A N/A Bleeding: Moderate N/A N/A Hemostasis Achieved: Pressure N/A N/A Procedural Pain: 0 N/A N/A Post Procedural Pain:  0 N/A N/A Debridement Treatment Procedure was tolerated N/A N/A Response: well Post Debridement 2.3x2.1x0.4 N/A N/A Measurements L x W x D (cm) Post Debridement 1.517 N/A N/A Volume: (cm) Periwound Skin Texture: Excoriation: No N/A N/A Induration: No Callus: No Crepitus: No Rash: No Scarring: No [name], [name] ([id number]) Periwound Skin Maceration: Yes N/A N/A Moisture: Dry/Scaly: No Periwound Skin Color: Atrophie Blanche: No N/A N/A Cyanosis: No Ecchymosis: No Erythema: No Hemosiderin Staining: No Mottled: No Pallor: No Rubor: No Temperature: No Abnormality N/A N/A Tenderness on Yes N/A N/A Palpation: Wound Preparation: Ulcer Cleansing: N/A N/A Rinsed/Irrigated with Saline Topical Anesthetic Applied: Other: lidocaine 4% Procedures Performed: Debridement N/A N/A Treatment Notes Wound #2 (Left, Lateral Calcaneus) 1. Cleansed with: Cleanse wound with antibacterial soap and water 2. Anesthetic Topical Lidocaine 4% cream to wound bed prior to debridement 4. Dressing Applied: Iodoflex 5. Secondary Dressing Applied ABD Pad Kerlix/Conform 7. Secured with Tape Other (specify in notes) Notes kerlix and coban wrap Electronic Signature(s) Signed: 10/09/2016 3:56:26 PM By: Linton Ham MD Entered By: Linton Ham on 10/09/2016 12:05:56 Riederer, Bea [name] ([id number]) -------------------------------------------------------------------------------- Multi-Disciplinary Care Plan Details Patient Name: [name] Date of Service: 10/09/2016 11:00 AM Medical Record Number: [id number] Patient Account Number: [id number] Date of Birth/Sex: 09/05/1940 (76 y.o. Female) Treating RN: Montey Hora Primary Care Deundra Bard: Leighton Ruff Other Clinician: Referring Esgar Barnick: Leighton Ruff Treating Brandt Chaney/Extender: Tito Dine in Treatment: 7 Active Inactive ` Abuse / Safety / Falls / Self Care Management Nursing Diagnoses: Potential for  falls Goals: Patient will remain injury free Date Initiated: 08/21/2016 Target Resolution Date: 10/08/2016 Goal Status: Active Interventions: Assess fall risk on admission and as needed Notes: ` Necrotic Tissue Nursing Diagnoses: Impaired tissue integrity related to necrotic/devitalized tissue Goals: Necrotic/devitalized tissue will be minimized in the wound bed Date Initiated: 08/21/2016 Target Resolution Date: 10/08/2016 Goal Status: Active Interventions: Assess patient pain level pre-, during and post procedure and prior to discharge Notes: ` Orientation to the Wound Care Program Nursing Diagnoses: Knowledge deficit related to the wound healing center program CANDACE, FRAISER ([id number]) Goals: Patient/caregiver will verbalize understanding of the [place] Date Initiated: 08/21/2016 Target Resolution Date: 10/08/2016 Goal Status: Active Interventions: Provide education on orientation to the wound center Notes: ` Peripheral Neuropathy Nursing Diagnoses: Potential alteration in peripheral tissue perfusion (select prior to confirmation of diagnosis) Goals: Patient/caregiver will verbalize understanding of disease process and disease management Date Initiated: 08/21/2016 Target Resolution Date: 10/08/2016 Goal Status: Active Interventions: Assess signs and symptoms of neuropathy upon admission and as  needed Notes: ` Venous Leg Ulcer Nursing Diagnoses: Actual venous Insuffiency (use after diagnosis is confirmed) Goals: Patient will maintain optimal edema control Date Initiated: 08/21/2016 Target Resolution Date: 10/08/2016 Goal Status: Active Interventions: Assess peripheral edema status every visit. Notes: ` Wound/Skin Impairment Nursing Diagnoses: Impaired tissue integrity Picco, Elva B. ([id number]) Goals: Patient/caregiver will verbalize understanding of skin care regimen Date Initiated: 08/21/2016 Target Resolution Date:  10/08/2016 Goal Status: Active Ulcer/skin breakdown will have a volume reduction of 30% by week 4 Date Initiated: 08/21/2016 Target Resolution Date: 10/08/2016 Goal Status: Active Ulcer/skin breakdown will have a volume reduction of 50% by week 8 Date Initiated: 08/21/2016 Target Resolution Date: 10/29/2016 Goal Status: Active Ulcer/skin breakdown will have a volume reduction of 80% by week 12 Date Initiated: 08/21/2016 Target Resolution Date: 11/12/2016 Goal Status: Active Ulcer/skin breakdown will heal within 14 weeks Date Initiated: 08/21/2016 Target Resolution Date: 11/12/2016 Goal Status: Active Interventions: Assess patient/caregiver ability to obtain necessary supplies Assess patient/caregiver ability to perform ulcer/skin care regimen upon admission and as needed Assess ulceration(s) every visit Notes: Electronic Signature(s) Signed: 10/09/2016 4:29:36 PM By: Montey Hora Entered By: Montey Hora on 10/09/2016 11:19:29 Stolp, Bea [name] ([id number]) -------------------------------------------------------------------------------- Pain Assessment Details Patient Name: [name] Date of Service: 10/09/2016 11:00 AM Medical Record Number: [id number] Patient Account Number: [id number] Date of Birth/Sex: Jun 26, 1941 (76 y.o. Female) Treating RN: Montey Hora Primary Care Eulas Schweitzer: Leighton Ruff Other Clinician: Referring Jerlisa Diliberto: Leighton Ruff Treating Malyna Budney/Extender: Tito Dine in Treatment: 7 Active Problems Location of Pain Severity and Description of Pain Patient Has Paino No Site Locations Pain Management and Medication Current Pain Management: Notes Topical or injectable lidocaine is offered to patient for acute pain when surgical debridement is performed. If needed, Patient is instructed to use over the counter pain medication for the following 24-48 hours after debridement. Wound care MDs do not prescribed pain medications. Patient has  chronic pain or uncontrolled pain. Patient has been instructed to make an appointment with their Primary Care Physician for pain management. Electronic Signature(s) Signed: 10/09/2016 4:29:36 PM By: Montey Hora Entered By: Montey Hora on 10/09/2016 10:59:27 Woolworth, Bea [name] ([id number]) -------------------------------------------------------------------------------- Patient/Caregiver Education Details Patient Name: [name] Date of Service: 10/09/2016 11:00 AM Medical Record Patient Account Number: [id number] Number: Treating RN: Montey Hora 1941/04/02 (76 y.o. Other Clinician: Date of Birth/Gender: Female) Treating ROBSON, MICHAEL Primary Care Physician: Leighton Ruff Physician/Extender: G Referring Physician: Bethanie Dicker in Treatment: 7 Education Assessment Education Provided To: Patient and Caregiver Education Topics Provided Wound/Skin Impairment: Handouts: Other: wound care as ordered Methods: Demonstration, Explain/Verbal Responses: State content correctly Electronic Signature(s) Signed: 10/09/2016 4:29:36 PM By: Montey Hora Entered By: Montey Hora on 10/09/2016 11:23:47 Coba, Bea [name] ([id number]) -------------------------------------------------------------------------------- Wound Assessment Details Patient Name: [name] Date of Service: 10/09/2016 11:00 AM Medical Record Number: [id number] Patient Account Number: [id number] Date of Birth/Sex: May 30, 1941 (76 y.o. Female) Treating RN: Montey Hora Primary Care Gaylen Pereira: Leighton Ruff Other Clinician: Referring Javyn Havlin: Leighton Ruff Treating Kenith Trickel/Extender: Tito Dine in Treatment: 7 Wound Status Wound Number: 2 Primary Diabetic Wound/Ulcer of the Lower Etiology: Extremity Wound Location: Left Calcaneus - Lateral Wound Open Wounding Event: Gradually Appeared Status: Date Acquired: 08/12/2016 Comorbid Anemia, Coronary Artery  Disease, Weeks Of Treatment: 7 History: Hypertension, Myocardial Infarction, Clustered Wound: No Type II Diabetes, Osteoarthritis, Neuropathy Photos Photo Uploaded By: Montey Hora on 10/09/2016 11:45:57 Wound Measurements Length: (cm) 2.3 Width: (cm) 2.1 Depth: (cm) 0.3 Area: (cm) 3.793 Volume: (cm) 1.138 % Reduction  in Area: 47.7% % Reduction in Volume: 21.6% Epithelialization: None Tunneling: No Undermining: No Wound Description Classification: Grade 1 Foul Odor Afte Wound Margin: Flat and Intact Due to Product Exudate Amount: Large Slough/Fibrino Exudate Type: Serous Exudate Color: amber r Cleansing: Yes Use: No Yes Wound Bed Granulation Amount: Small (1-33%) Exposed Structure Granulation Quality: Red Fascia Exposed: No Necrotic Amount: Large (67-100%) Fat Layer (Subcutaneous Tissue) Exposed: No Pinera, Reema B. ([id number]) Necrotic Quality: Eschar, Adherent Slough Tendon Exposed: No Muscle Exposed: No Joint Exposed: No Bone Exposed: No Limited to Skin Breakdown Periwound Skin Texture Texture Color No Abnormalities Noted: No No Abnormalities Noted: No Callus: No Atrophie Blanche: No Crepitus: No Cyanosis: No Excoriation: No Ecchymosis: No Induration: No Erythema: No Rash: No Hemosiderin Staining: No Scarring: No Mottled: No Pallor: No Moisture Rubor: No No Abnormalities Noted: No Dry / Scaly: No Temperature / Pain Maceration: Yes Temperature: No Abnormality Tenderness on Palpation: Yes Wound Preparation Ulcer Cleansing: Rinsed/Irrigated with Saline Topical Anesthetic Applied: Other: lidocaine 4%, Treatment Notes Wound #2 (Left, Lateral Calcaneus) 1. Cleansed with: Cleanse wound with antibacterial soap and water 2. Anesthetic Topical Lidocaine 4% cream to wound bed prior to debridement 4. Dressing Applied: Iodoflex 5. Secondary Dressing Applied ABD Pad Kerlix/Conform 7. Secured with Tape Other (specify in  notes) Notes kerlix and coban wrap Electronic Signature(s) Signed: 10/09/2016 4:29:36 PM By: Montey Hora Entered By: Montey Hora on 10/09/2016 11:19:03 [place], Bea [name] ([id number]) ARRION, STARY ([id number]) -------------------------------------------------------------------------------- [place] Details Patient Name: [name] Date of Service: 10/09/2016 11:00 AM Medical Record Number: [id number] Patient Account Number: [id number] Date of Birth/Sex: 18-Jan-1941 (76 y.o. Female) Treating RN: Montey Hora Primary Care Alvah Lagrow: Leighton Ruff Other Clinician: Referring Draper Gallon: Leighton Ruff Treating Tanaya Dunigan/Extender: Tito Dine in Treatment: 7 Vital Signs Time Taken: 10:59 Temperature (F): 98.3 Height (in): 63 Pulse (bpm): 75 Weight (lbs): 205 Respiratory Rate (breaths/min): 18 Body Mass Index (BMI): 36.3 Blood Pressure (mmHg): 158/57 Reference Range: 80 - 120 mg / dl Electronic Signature(s) Signed: 10/09/2016 4:29:36 PM By: Montey Hora Entered By: Montey Hora on 10/09/2016 11:01:03

## 2016-10-10 NOTE — Patient Outreach (Addendum)
[facility]) Care Management  10/10/2016  [name] 11/17/1940 [id number]   Patient was called to follow up on insulin dosing and to see if she scheduled her appointment with her PCP as encouraged.  No answer. HIPAA compliant message left on her voicemail.  Plan:  1.  Call patient back within 3-5 business days. ( She has a call with [facility] Nurse Practitioner, Bradd Canary)  2.  Coordinate with Arbie Cookey after her call/visit  3. Consider closing pharmacy case    [name], PharmD, [place] Clinical Pharmacist [phone]

## 2016-10-11 ENCOUNTER — Ambulatory Visit: Payer: Self-pay | Admitting: *Deleted

## 2016-10-11 ENCOUNTER — Other Ambulatory Visit: Payer: Self-pay | Admitting: *Deleted

## 2016-10-11 DIAGNOSIS — I252 Old myocardial infarction: Secondary | ICD-10-CM | POA: Diagnosis not present

## 2016-10-11 DIAGNOSIS — I480 Paroxysmal atrial fibrillation: Secondary | ICD-10-CM | POA: Diagnosis not present

## 2016-10-11 DIAGNOSIS — E11621 Type 2 diabetes mellitus with foot ulcer: Secondary | ICD-10-CM | POA: Diagnosis not present

## 2016-10-11 DIAGNOSIS — N183 Chronic kidney disease, stage 3 (moderate): Secondary | ICD-10-CM | POA: Diagnosis not present

## 2016-10-11 DIAGNOSIS — E782 Mixed hyperlipidemia: Secondary | ICD-10-CM | POA: Diagnosis not present

## 2016-10-11 DIAGNOSIS — E1165 Type 2 diabetes mellitus with hyperglycemia: Secondary | ICD-10-CM | POA: Diagnosis not present

## 2016-10-11 DIAGNOSIS — D649 Anemia, unspecified: Secondary | ICD-10-CM | POA: Diagnosis not present

## 2016-10-11 DIAGNOSIS — I10 Essential (primary) hypertension: Secondary | ICD-10-CM | POA: Diagnosis not present

## 2016-10-11 DIAGNOSIS — E1159 Type 2 diabetes mellitus with other circulatory complications: Secondary | ICD-10-CM | POA: Diagnosis not present

## 2016-10-11 DIAGNOSIS — Z89511 Acquired absence of right leg below knee: Secondary | ICD-10-CM | POA: Diagnosis not present

## 2016-10-11 DIAGNOSIS — I2510 Atherosclerotic heart disease of native coronary artery without angina pectoris: Secondary | ICD-10-CM | POA: Diagnosis not present

## 2016-10-11 DIAGNOSIS — E781 Pure hyperglyceridemia: Secondary | ICD-10-CM | POA: Diagnosis not present

## 2016-10-11 NOTE — Patient Outreach (Signed)
Called pt to assess how she is doing with reinforced Lantus regimen and specific sliding scale. She did not answer but I left a message requesting that she return my call. I think she had a visit scheduled with Dr. Drema Dallas today. I will continue to try to reach her until successful on a daily work week basis.  [name] [facility] [place] [phone]

## 2016-10-14 ENCOUNTER — Other Ambulatory Visit: Payer: Self-pay | Admitting: *Deleted

## 2016-10-14 NOTE — Patient Outreach (Signed)
Telephone assessment. Pt reports she saw Dr. Drema Dallas on Friday. She has dropped her dose of Lantus to 40 units am and pm. Pt reports she has been having low values for her 70-90's in am. We discussed having a bed time snack and also not taking regular insulin too close to going to bed. She voices confidence in getting her glucose level in control.   We have scheduled an appt for 10/31/16 at 1:00 pm.  [name] [place] [phone]

## 2016-10-16 ENCOUNTER — Encounter: Payer: PPO | Attending: Internal Medicine | Admitting: Internal Medicine

## 2016-10-16 DIAGNOSIS — H3530 Unspecified macular degeneration: Secondary | ICD-10-CM | POA: Insufficient documentation

## 2016-10-16 DIAGNOSIS — L97423 Non-pressure chronic ulcer of left heel and midfoot with necrosis of muscle: Secondary | ICD-10-CM | POA: Insufficient documentation

## 2016-10-16 DIAGNOSIS — D649 Anemia, unspecified: Secondary | ICD-10-CM | POA: Insufficient documentation

## 2016-10-16 DIAGNOSIS — M1990 Unspecified osteoarthritis, unspecified site: Secondary | ICD-10-CM | POA: Diagnosis not present

## 2016-10-16 DIAGNOSIS — E785 Hyperlipidemia, unspecified: Secondary | ICD-10-CM | POA: Insufficient documentation

## 2016-10-16 DIAGNOSIS — Z87891 Personal history of nicotine dependence: Secondary | ICD-10-CM | POA: Insufficient documentation

## 2016-10-16 DIAGNOSIS — Z992 Dependence on renal dialysis: Secondary | ICD-10-CM | POA: Insufficient documentation

## 2016-10-16 DIAGNOSIS — I2510 Atherosclerotic heart disease of native coronary artery without angina pectoris: Secondary | ICD-10-CM | POA: Diagnosis not present

## 2016-10-16 DIAGNOSIS — I4891 Unspecified atrial fibrillation: Secondary | ICD-10-CM | POA: Insufficient documentation

## 2016-10-16 DIAGNOSIS — L97221 Non-pressure chronic ulcer of left calf limited to breakdown of skin: Secondary | ICD-10-CM | POA: Insufficient documentation

## 2016-10-16 DIAGNOSIS — E1140 Type 2 diabetes mellitus with diabetic neuropathy, unspecified: Secondary | ICD-10-CM | POA: Insufficient documentation

## 2016-10-16 DIAGNOSIS — Z7901 Long term (current) use of anticoagulants: Secondary | ICD-10-CM | POA: Diagnosis not present

## 2016-10-16 DIAGNOSIS — L97422 Non-pressure chronic ulcer of left heel and midfoot with fat layer exposed: Secondary | ICD-10-CM | POA: Diagnosis not present

## 2016-10-16 DIAGNOSIS — I87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: Secondary | ICD-10-CM | POA: Diagnosis not present

## 2016-10-16 DIAGNOSIS — L03116 Cellulitis of left lower limb: Secondary | ICD-10-CM | POA: Insufficient documentation

## 2016-10-16 DIAGNOSIS — I129 Hypertensive chronic kidney disease with stage 1 through stage 4 chronic kidney disease, or unspecified chronic kidney disease: Secondary | ICD-10-CM | POA: Diagnosis not present

## 2016-10-16 DIAGNOSIS — Z794 Long term (current) use of insulin: Secondary | ICD-10-CM | POA: Diagnosis not present

## 2016-10-16 DIAGNOSIS — E11621 Type 2 diabetes mellitus with foot ulcer: Secondary | ICD-10-CM | POA: Diagnosis not present

## 2016-10-16 DIAGNOSIS — E1122 Type 2 diabetes mellitus with diabetic chronic kidney disease: Secondary | ICD-10-CM | POA: Diagnosis not present

## 2016-10-16 DIAGNOSIS — E1151 Type 2 diabetes mellitus with diabetic peripheral angiopathy without gangrene: Secondary | ICD-10-CM | POA: Insufficient documentation

## 2016-10-16 DIAGNOSIS — Z89511 Acquired absence of right leg below knee: Secondary | ICD-10-CM | POA: Diagnosis not present

## 2016-10-16 DIAGNOSIS — I252 Old myocardial infarction: Secondary | ICD-10-CM | POA: Diagnosis not present

## 2016-10-16 DIAGNOSIS — N183 Chronic kidney disease, stage 3 (moderate): Secondary | ICD-10-CM | POA: Insufficient documentation

## 2016-10-16 DIAGNOSIS — Z936 Other artificial openings of urinary tract status: Secondary | ICD-10-CM | POA: Insufficient documentation

## 2016-10-16 DIAGNOSIS — Z8551 Personal history of malignant neoplasm of bladder: Secondary | ICD-10-CM | POA: Diagnosis not present

## 2016-10-17 ENCOUNTER — Encounter: Payer: Self-pay | Admitting: Family

## 2016-10-17 ENCOUNTER — Ambulatory Visit (HOSPITAL_COMMUNITY)
Admission: RE | Admit: 2016-10-17 | Discharge: 2016-10-17 | Disposition: A | Discharge status: Home / Self Care | Payer: PPO | Source: Ambulatory Visit | Attending: Family | Admitting: Family

## 2016-10-17 ENCOUNTER — Ambulatory Visit (INDEPENDENT_AMBULATORY_CARE_PROVIDER_SITE_OTHER): Payer: PPO | Admitting: Family

## 2016-10-17 VITALS — BP 120/60 | HR 60 | Temp 97.2°F | Ht 63.0 in | Wt 206.0 lb

## 2016-10-17 DIAGNOSIS — I872 Venous insufficiency (chronic) (peripheral): Secondary | ICD-10-CM

## 2016-10-17 DIAGNOSIS — I6523 Occlusion and stenosis of bilateral carotid arteries: Secondary | ICD-10-CM | POA: Insufficient documentation

## 2016-10-17 DIAGNOSIS — Z89511 Acquired absence of right leg below knee: Secondary | ICD-10-CM

## 2016-10-17 DIAGNOSIS — I6521 Occlusion and stenosis of right carotid artery: Secondary | ICD-10-CM

## 2016-10-17 DIAGNOSIS — Z95828 Presence of other vascular implants and grafts: Secondary | ICD-10-CM | POA: Diagnosis not present

## 2016-10-17 DIAGNOSIS — I779 Disorder of arteries and arterioles, unspecified: Secondary | ICD-10-CM

## 2016-10-17 DIAGNOSIS — Z9889 Other specified postprocedural states: Secondary | ICD-10-CM | POA: Diagnosis not present

## 2016-10-17 DIAGNOSIS — I739 Peripheral vascular disease, unspecified: Secondary | ICD-10-CM | POA: Insufficient documentation

## 2016-10-17 DIAGNOSIS — E1151 Type 2 diabetes mellitus with diabetic peripheral angiopathy without gangrene: Secondary | ICD-10-CM

## 2016-10-17 DIAGNOSIS — L97328 Non-pressure chronic ulcer of left ankle with other specified severity: Secondary | ICD-10-CM

## 2016-10-17 LAB — VAS US CAROTID
LCCAPDIAS: 36 cm/s
LEFT ECA DIAS: -4 cm/s
LEFT VERTEBRAL DIAS: -13 cm/s
LICAPSYS: -136 cm/s
Left CCA dist dias: -46 cm/s
Left CCA dist sys: -176 cm/s
Left CCA prox sys: 135 cm/s
Left ICA dist dias: -55 cm/s
Left ICA dist sys: -165 cm/s
Left ICA prox dias: -39 cm/s
RIGHT CCA MID DIAS: 2 cm/s
RIGHT ECA DIAS: -3 cm/s
RIGHT VERTEBRAL DIAS: 15 cm/s
Right CCA prox dias: 3 cm/s
Right CCA prox sys: 78 cm/s

## 2016-10-17 NOTE — Patient Instructions (Signed)

## 2016-10-17 NOTE — Progress Notes (Signed)
[facility] [place]   CC: Follow up peripheral artery occlusive disease  History of Present Illness [name] is a 76 y.o. female  patient of Dr. Donnetta Hutching who returns for follow-up s/p left femoral to anterior tibial bypass in February 2016. She continues to have some changes consistent with venous stasis disease on her left calf, the ulcers have healed. She is completely healed her left toe amputation. She is s/p right BKA in 2008, she walks with a prosthesis and a cane. She states that she uses her w/c at home as she does not want to fall.  She does not walk enough to elicit claudication sx's other than when she goes to church.   She sees Dr. Jimmy Footman, nephrologist.   She has a urostomy as a result of bladder cancer surgery.  Pt was discharged 04/17 from [facility] after admission for sepsis, had afib RVR, tachybrady w/ 6 sec pauses in the setting of hypoxia.    She was evaluated by K. Trinh PA-C and Dr. Donnetta Hutching on 08-27-16. At that time the patient's left femoral to anterior tibial bypass was patent. She should have enough blood flow to heal her left heel wound. Advised continued wound care with Santyl and compression therapy. Doxycycline refilled. She was to see Dr. Dellia Nims in one week for continued wound evaluation. She was advised to return in 1 month to see NP for a carotid duplex given bruit on exam and wound check.  Pt Diabetic: Yes, 12.7 A1C on 03-21-16 (reveiw of records), improved from 14.  Pt smoker: former smoker, quit in 1977, smoked for about 13 years  Pt meds include: Statin : yes Betablocker: Yes ASA: Yes Other anticoagulants/antiplatelets: Eliquis, takes for a-fib   Past Medical History:  Diagnosis Date  . Anemia   . Blood transfusion   . CAD (coronary artery disease) 01/26/2013   Evidently circumflex stent in 2014 following bladder cancer surgery for old myocardial infarction.   . CKD (chronic kidney disease) stage 3, GFR 30-59 ml/min  01/26/2013  . GERD (gastroesophageal reflux disease)   . Glaucoma   . History of atrial tachycardia   . History of bladder cancer TCC AND CIS  . Hyperlipidemia   . Hypertension   . Macular degeneration of both eyes   . Myocardial infarction   . Obesity (BMI 30-39.9)   . Peripheral neuropathy (HCC) LEGS AND HANDS  . Peripheral vascular disease (HCC) S/P RIGHT BKA  . Presence of urostomy ([place])   . Retinopathy due to secondary diabetes mellitus ([place])   . 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 Type 2 diabetes mellitus with vascular disease [facility])     Social History Social History  Substance Use Topics  . Smoking status: Former Smoker    Packs/day: 2.00    Years: 17.00    Types: Cigarettes    Quit date: 10/09/1975  . Smokeless tobacco: Never Used  . Alcohol use 0.0 oz/week     Comment: RARE    Family History Family History  Problem Relation Age of Onset  . Heart failure Mother     died age 22  . Sudden death Father 59  . Heart disease Father   . Hypertension Father   . Heart attack Father   . Diabetes Brother   . Heart attack Brother   . AAA (abdominal aortic aneurysm) Brother   . Heart disease Brother     Past Surgical History:  Procedure Laterality Date  . ABDOMINAL  ANGIOGRAM N/A 09/08/2014   Procedure: ABDOMINAL ANGIOGRAM;  Surgeon: Conrad , MD;  Location: [facility] CATH LAB;  Service: Cardiovascular;  Laterality: N/A;  . ABDOMINAL HYSTERECTOMY    . AMPUTATION Left 09/14/2014   Procedure: LEFT FOOT FIRST RAY AMPUTATION;  Surgeon: Mcarthur Rossetti, MD;  Location: [employer] [facility];  Service: Orthopedics;  Laterality: Left;  . APPENDECTOMY  1963  . [place]  . BELOW KNEE LEG AMPUTATION  05-02-2007   RIGHT  . CATARACT EXTRACTION W/ INTRAOCULAR LENS  IMPLANT, BILATERAL    . CORONARY STENT PLACEMENT    . CYSTO / RESECTION BLADDER BX'S  04-01-11  &  11-26-10  . CYSTOSCOPY WITH BIOPSY  10/14/2011   Procedure: CYSTOSCOPY WITH BIOPSY;  Surgeon:  Claybon Jabs, MD;  Location[facility];  Service: Urology;  Laterality: N/A;  BLADDER BIOPSY  . FEMORAL-POPLITEAL BYPASS GRAFT Left 09/12/2014   Procedure:  LEFT FEMORAL-POPLITEAL ARTERY BYPASS GRAFT USING NON REVERSE LEFT GREATER SAPHENOUS VEIN;  Surgeon: Rosetta Posner, MD;  Location: [place];  Service: Vascular;  Laterality: Left;  . I & D RIGHT BELOW KNEE AMPUTATION WOUND  06-10-2011  . LAPAROSCOPIC CHOLECYSTECTOMY  12-10-1999  . REVISION UROSTOMY CUTANEOUS    . REVISION UROSTOMY CUTANEOUS    . RIGHT FOOT I & D / REMOVAL NECROTIC BONE  JUN  &  AUG 2008  . RIGHT GREAT TOE AMPUTATION  11-05-2006    Allergies  Allergen Reactions  . Gabapentin Other (See Comments)    Extremely drunk and lethargic  . Lisinopril Other (See Comments)    Increased creatinine levels (pt currently taking low dose)  . Metformin And Related Other (See Comments)    Increased creatinine levels  . Biaxin [Clarithromycin] Itching    Current Outpatient Prescriptions  Medication Sig Dispense Refill  . acetaminophen (TYLENOL) 500 MG tablet Take 1,000 mg by mouth every 6 (six) hours as needed for mild pain.     Marland Kitchen amiodarone (PACERONE) 200 MG tablet Take 1 tablet (200 mg total) by mouth daily. OVERDUE FOR FOLLOW UP. CALL AND SCHEDULE [phone] 30 tablet 0  . apixaban (ELIQUIS) 5 MG TABS tablet Take 1 tablet (5 mg total) by mouth 2 (two) times daily. 60 tablet 11  . atorvastatin (LIPITOR) 80 MG tablet TAKE 1 TABLET (80 MG TOTAL) BY MOUTH EVERY EVENING. 90 tablet 3  . calcitRIOL (ROCALTROL) 0.25 MCG capsule Take 1 capsule by mouth daily.    . collagenase (SANTYL) ointment Apply 1 application topically daily.    . furosemide (LASIX) 40 MG tablet Take 80 mg by mouth 2 (two) times daily. Takes all doses as often as she can.     . insulin glargine (LANTUS) 100 UNIT/ML injection Inject 0.3 mLs (30 Units total) into the skin 2 (two) times daily. 55 units in the morning and 60 units at night 10 mL 11  .  isosorbide mononitrate (IMDUR) 30 MG 24 hr tablet Take 1 tablet (30 mg total) by mouth daily. Please call and schedule follow up appt for further refills [phone] 30 tablet 2  . Magnesium 400 MG TABS Take 400 mg by mouth daily.     . Melatonin 10 MG TABS Take 10 mg by mouth at bedtime.     . metoprolol succinate (TOPROL-XL) 100 MG 24 hr tablet TAKE 1 TABLET (100 MG TOTAL) BY MOUTH EVERY EVENING. TAKE WITH OR IMMEDIATELY FOLLOWING A MEAL. 90 tablet 2  . Multiple Vitamins-Minerals (PRESERVISION  AREDS PO) Take 1 capsule by mouth 2 (two) times daily.    . nitroGLYCERIN (NITROSTAT) 0.6 MG SL tablet Take 0.6 mg by mouth every 5 (five) minutes x 3 doses as needed.    Marland Kitchen NOVOLOG FLEXPEN 100 UNIT/ML FlexPen Use per sliding scale    . omeprazole (PRILOSEC) 20 MG capsule Take 20 mg by mouth every evening.     . polyethylene glycol (MIRALAX / GLYCOLAX) packet Take 17 g by mouth as needed.    . potassium chloride SA (K-DUR,KLOR-CON) 20 MEQ tablet Take 20 mEq by mouth daily.     Marland Kitchen senna (SENOKOT) 8.6 MG TABS tablet Take 4 tablets by mouth at bedtime as needed (([employer])). Takes 1 tab daily and can take addt'l tab as needed for constipation    . sodium bicarbonate 650 MG tablet Take 1,300 mg by mouth 2 (two) times daily.     No current facility-administered medications for this visit.     ROS: See HPI for pertinent positives and negatives.   Physical Examination  Vitals:   10/17/16 1535  BP: 120/60  Pulse: 60  Temp: 97.2 F (36.2 C)  TempSrc: Oral  Weight: 206 lb (93.4 kg)  Height: 5\' 3"  (1.6 m)   Body mass index is 36.49 kg/m.  General: A&O x 3, WDWN, obese female. Gait: seated in w/c Eyes: PERRLA. Pulmonary: Respirations are non labored, CTAB but distant breath sounds, respirations are non labored, no wheezes, rales, or rhonchi. Cardiac: regular rhythm, no detected murmur.    Carotid Bruits Right Left   Negative positive  Aorta is not palpable. Radial pulses: 2+  bilateral   VASCULAR EXAM: Extremitieswithoutischemic changes, withoutGangrene; withoutopen wounds. Right BKA with prosthesis in place. Left calf with chronic venous stasis dermatitis, no ulcers. Left great toe is surgically absent. Left lower leg and foot with 1-2+ pitting and non pitting edema.   LE Pulses Right Left  FEMORAL not palpable  palpable  POPLITEAL not palpable not palpable  POSTERIOR TIBIAL BKA 1+ palpable  DORSALIS PEDIS ANTERIOR TIBIAL BKA 1+ palpable   Abdomen: soft, large panus, NT, no palpable masses. Urostomy with appliance in place. Skin: no rashes, see Extremities. Musculoskeletal: Generalized deconditioning, see Extremities. Neurologic: A&O X 3; Appropriate Affect ; SENSATION:diminished in left foot; MOTOR FUNCTION: moving all extremities equally, motor strength 4/5 in upper extremities, 3/5 in lower extremities. Speech is fluent/normal. CN 2-12 intact     Non-Invasive Vascular Imaging  Today's carotid duplex suggests occlusion of right proximal ICA and <40% stenosis of left ICA.  Bilateral vertebral artery flow is antegrade.  Bilateral subclavian artery waveforms are normal.  No prior exam at this facility.   Today's left LE arterial duplex was difficult due to edema in the left lower leg and has an unna boot dressing at lower leg and foot. Elevated velocity in the proximal anastomosis (232 cm/s) of left leg bypass graft.  No significant change compared to the exam on 08-29-15.   Left ABI on 8-34-19 was supra systolic at 6.22 with biphasic waveforms. No TBI recorded.    ASSESSMENT: [name] is a 76 y.o. female who is s/p left femoral to anterior tibial bypass in February 2016, and s/p right BKA in 2008. She is obtaining wound care at [facility], under Dr. Janalyn Rouse care; has a venous stasis ulcer left lateral malleolus, not examined as pt has unna boot dressing in  place.  She states that she uses her w/c at home as she does not want to  fall.  She does not walk enough to elicit claudication sx's other than when she goes to church.  Her left PT and DP pulses are palpable.  Left carotid bruit present, she has no hx of stroke or TIA.  Her primary atherosclerotic risk factor remains her uncontrolled DM. She has been working closely with her PCP to get her hyperglycemia under better control and states she has made progress. Her other atherosclerotic risk factors include 13 years smoking hx, quit in 1977, CKD, CAD, dyslipidemia, history of bladder cancer, and obesity. She is taking Eliquis for hx of atrial fib. She takes a statin and a beta blocker.    PLAN:  Daily seated arms and legs exercises demonstrated and discussed.  Based on the patient's vascular studies and examination, pt will return to clinic in 6 months with ABI's, left LE arterial duplex;  carotid duplex in a year.   I discussed in depth with the patient the nature of atherosclerosis, and emphasized the importance of maximal medical management including strict control of blood pressure, blood glucose, and lipid levels, obtaining regular exercise, and continued cessation of smoking.  The patient is aware that without maximal medical management the underlying atherosclerotic disease process will progress, limiting the benefit of any interventions.  The patient was given information about PAD including signs, symptoms, treatment, what symptoms should prompt the patient to seek immediate medical care, and risk reduction measures to take.  Clemon Chambers, RN, MSN, FNP-C [facility] [employer] Phone: [phone]  Clinic MD: [facility]  10/17/16 3:47 PM

## 2016-10-18 NOTE — Progress Notes (Signed)
[name], [name] ([id number]) Visit Report for 10/16/2016 Arrival Information Details Patient Name: [name] Date of Service: 10/16/2016 2:30 PM Medical Record Number: [id number] Patient Account Number: [id number] Date of Birth/Sex: [birth year] (76 y.o. Female) Treating RN: [name] Primary Care [name]: [name] Other Clinician: Referring [name]: [name] Treating Korbyn Chopin/Extender: [name] in Treatment: 8 Visit Information History Since Last Visit Added or deleted any medications: No Patient Arrived: Wheel Chair Any new allergies or adverse reactions: No Arrival Time: 14:45 Had a fall or experienced change in No Accompanied By: friend activities of daily living that may affect Transfer Assistance: None risk of falls: Patient Identification Verified: Yes Signs or symptoms of abuse/neglect since last No Secondary Verification Process Yes visito Completed: Hospitalized since last visit: No Patient Has Alerts: Yes Has Dressing in Place as Prescribed: Yes Patient Alerts: Patient on Blood Has Compression in Place as Prescribed: Yes Thinner Pain Present Now: No eliquis DMII ABI [place] >220 Electronic Signature(s) Signed: 10/16/2016 5:13:29 PM By: [name] Entered By: [name] on 10/16/2016 14:45:54 [name], [name] ([id number]) -------------------------------------------------------------------------------- Encounter Discharge Information Details Patient Name: [name] Date of Service: 10/16/2016 2:30 PM Medical Record Number: [id number] Patient Account Number: [id number] Date of Birth/Sex: 1941/03/16 (76 y.o. Female) Treating RN: [name] Primary Care Shenekia Riess: [name] Other Clinician: Referring Ellanor Feuerstein: [name] Treating Ala Capri/Extender: [name] in Treatment: 8 Encounter Discharge Information Items Discharge Pain Level: 0 Discharge Condition: Stable Ambulatory  Status: Ambulatory Discharge Destination: Home Transportation: Private Auto Accompanied By: friend Schedule Follow-up Appointment: Yes Medication Reconciliation completed and provided to Patient/Care No Norah Fick: Provided on Clinical Summary of Care: 10/16/2016 Form Type Recipient Paper Patient BI Electronic Signature(s) Signed: 10/16/2016 3:25:13 PM By: [name] Entered By: [name] on 10/16/2016 15:25:13 [name], [name] ([id number]) -------------------------------------------------------------------------------- Lower Extremity Assessment Details Patient Name: [name] Date of Service: 10/16/2016 2:30 PM Medical Record Number: [id number] Patient Account Number: [id number] Date of Birth/Sex: 1941-06-16 (76 y.o. Female) Treating RN: [name] Primary Care Sashia Campas: [name] Other Clinician: Referring [name]: [name] Treating [name]/Extender: [name] in Treatment: 8 Vascular Assessment Pulses: Dorsalis Pedis Palpable: [Left:Yes] Posterior Tibial Extremity colors, hair growth, and conditions: Extremity Color: [Left:Hyperpigmented] Hair Growth on Extremity: [Left:No] Temperature of Extremity: [Left:Warm] Capillary Refill: [Left:< 3 seconds] Electronic Signature(s) Signed: 10/16/2016 5:13:29 PM By: [name] Entered By: [name] on 10/16/2016 14:57:50 [name], [name] ([id number]) -------------------------------------------------------------------------------- Multi Wound Chart Details Patient Name: [name] Date of Service: 10/16/2016 2:30 PM Medical Record Number: [id number] Patient Account Number: [id number] Date of Birth/Sex: [birth year] (76 y.o. Female) Treating RN: [name] Primary Care Connelly [place]: [name] Other Clinician: Referring [name]: [name] Treating Kielan Dreisbach/Extender: [name] in Treatment: 8 Vital Signs Height(in): 63 Pulse(bpm):  67 Weight(lbs): 205 Blood Pressure 140/57 (mmHg): Body Mass Index(BMI): 36 Temperature(F): 98.0 Respiratory Rate 18 (breaths/min): Photos: [N/A:N/A] Wound Location: Left Calcaneus - Lateral N/A N/A Wounding Event: Gradually Appeared N/A N/A Primary Etiology: Diabetic Wound/Ulcer of N/A N/A the Lower Extremity Comorbid History: Anemia, Coronary Artery N/A N/A Disease, Hypertension, Myocardial Infarction, Type II Diabetes, Osteoarthritis, Neuropathy Date Acquired: 08/12/2016 N/A N/A Weeks of Treatment: 8 N/A N/A Wound Status: Open N/A N/A Measurements L x W x D 2.3x2.3x0.2 N/A N/A (cm) Area (cm) : 4.155 N/A N/A Volume (cm) : 0.831 N/A N/A % Reduction in Area: 42.70% N/A N/A % Reduction in Volume: 42.70% N/A N/A Classification: Grade 1 N/A N/A Exudate Amount: Large N/A  N/A Exudate Type: Serous N/A N/A Exudate Color: amber N/A N/A Yes N/A N/A [name], [name] B. ([id number]) Foul Odor After Cleansing: Odor Anticipated Due to No N/A N/A Product Use: Wound Margin: Flat and Intact N/A N/A Granulation Amount: Small (1-33%) N/A N/A Granulation Quality: Red N/A N/A Necrotic Amount: Large (67-100%) N/A N/A Necrotic Tissue: Eschar, Adherent Slough N/A N/A Exposed Structures: Fascia: No N/A N/A Fat Layer (Subcutaneous Tissue) Exposed: No Tendon: No Muscle: No Joint: No Bone: No Limited to Skin Breakdown Epithelialization: None N/A N/A Debridement: Debridement ([id number]- N/A N/A 11047) Pre-procedure 15:00 N/A N/A Verification/Time Out Taken: Pain Control: Lidocaine 4% Topical N/A N/A Solution Tissue Debrided: Fibrin/Slough, N/A N/A Subcutaneous Level: Skin/Subcutaneous N/A N/A Tissue Debridement Area (sq 5.29 N/A N/A cm): Instrument: Curette N/A N/A Bleeding: Moderate N/A N/A Hemostasis Achieved: Pressure N/A N/A Procedural Pain: 0 N/A N/A Post Procedural Pain: 0 N/A N/A Debridement Treatment Procedure was tolerated N/A N/A Response: well Post Debridement  2.3x2.3x0.3 N/A N/A Measurements L x W x D (cm) Post Debridement 1.246 N/A N/A Volume: (cm) Periwound Skin Texture: Excoriation: No N/A N/A Induration: No Callus: No Crepitus: No Rash: No Scarring: No [name], [name] B. ([id number]) Periwound Skin Maceration: Yes N/A N/A Moisture: Dry/Scaly: No Periwound Skin Color: Atrophie Blanche: No N/A N/A Cyanosis: No Ecchymosis: No Erythema: No Hemosiderin Staining: No Mottled: No Pallor: No Rubor: No Temperature: No Abnormality N/A N/A Tenderness on Yes N/A N/A Palpation: Wound Preparation: Ulcer Cleansing: N/A N/A Rinsed/Irrigated with Saline Topical Anesthetic Applied: Other: lidocaine 4% Procedures Performed: Debridement N/A N/A Treatment Notes Wound #2 (Left, Lateral Calcaneus) 1. Cleansed with: Cleanse wound with antibacterial soap and water 2. Anesthetic Topical Lidocaine 4% cream to wound bed prior to debridement 4. Dressing Applied: Iodoflex 5. Secondary Dressing Applied ABD Pad Dry Gauze 7. Secured with Other (specify in notes) Notes kerlix and coban wrap Electronic Signature(s) Signed: 10/17/2016 5:01:50 PM By: Linton Ham MD Entered By: Linton Ham on 10/16/2016 15:35:07 [name], [name] ([id number]) -------------------------------------------------------------------------------- Multi-Disciplinary Care Plan Details Patient Name: [name] Date of Service: 10/16/2016 2:30 PM Medical Record Number: [id number] Patient Account Number: [id number] Date of Birth/Sex: [birth year] (76 y.o. Female) Treating RN: [name] Primary Care Newt Levingston: [name] Other Clinician: Referring Arminta Gamm: [name] Treating Taym Twist/Extender: [name] in Treatment: 8 Active Inactive ` Abuse / Safety / Falls / Self Care Management Nursing Diagnoses: Potential for falls Goals: Patient will remain injury free Date Initiated: 08/21/2016 Target Resolution Date:  10/08/2016 Goal Status: Active Interventions: Assess fall risk on admission and as needed Notes: ` Necrotic Tissue Nursing Diagnoses: Impaired tissue integrity related to necrotic/devitalized tissue Goals: Necrotic/devitalized tissue will be minimized in the wound bed Date Initiated: 08/21/2016 Target Resolution Date: 10/08/2016 Goal Status: Active Interventions: Assess patient pain level pre-, during and post procedure and prior to discharge Notes: ` Orientation to the Wound Care Program Nursing Diagnoses: Knowledge deficit related to the wound healing center program TESNEEM, DUFRANE ([id number]) Goals: Patient/caregiver will verbalize understanding of the [facility] Date Initiated: 08/21/2016 Target Resolution Date: 10/08/2016 Goal Status: Active Interventions: Provide education on orientation to the wound center Notes: ` Peripheral Neuropathy Nursing Diagnoses: Potential alteration in peripheral tissue perfusion (select prior to confirmation of diagnosis) Goals: Patient/caregiver will verbalize understanding of disease process and disease management Date Initiated: 08/21/2016 Target Resolution Date: 10/08/2016 Goal Status: Active Interventions: Assess signs and symptoms of neuropathy upon admission and as needed Notes: ` Venous Leg Ulcer Nursing Diagnoses: Actual venous Insuffiency (use  after diagnosis is confirmed) Goals: Patient will maintain optimal edema control Date Initiated: 08/21/2016 Target Resolution Date: 10/08/2016 Goal Status: Active Interventions: Assess peripheral edema status every visit. Notes: ` Wound/Skin Impairment Nursing Diagnoses: Impaired tissue integrity Sustaita, [name] B. ([id number]) Goals: Patient/caregiver will verbalize understanding of skin care regimen Date Initiated: 08/21/2016 Target Resolution Date: 10/08/2016 Goal Status: Active Ulcer/skin breakdown will have a volume reduction of 30% by week 4 Date  Initiated: 08/21/2016 Target Resolution Date: 10/08/2016 Goal Status: Active Ulcer/skin breakdown will have a volume reduction of 50% by week 8 Date Initiated: 08/21/2016 Target Resolution Date: 10/29/2016 Goal Status: Active Ulcer/skin breakdown will have a volume reduction of 80% by week 12 Date Initiated: 08/21/2016 Target Resolution Date: 11/12/2016 Goal Status: Active Ulcer/skin breakdown will heal within 14 weeks Date Initiated: 08/21/2016 Target Resolution Date: 11/12/2016 Goal Status: Active Interventions: Assess patient/caregiver ability to obtain necessary supplies Assess patient/caregiver ability to perform ulcer/skin care regimen upon admission and as needed Assess ulceration(s) every visit Notes: Electronic Signature(s) Signed: 10/16/2016 5:13:29 PM By: [name] Entered By: [name] on 10/16/2016 14:58:22 [name], [name] ([id number]) -------------------------------------------------------------------------------- Pain Assessment Details Patient Name: [name] Date of Service: 10/16/2016 2:30 PM Medical Record Number: [id number] Patient Account Number: [id number] Date of Birth/Sex: 1940/10/27 (76 y.o. Female) Treating RN: [name] Primary Care Jacklin Zwick: [name] Other Clinician: Referring Eddie Koc: [name] Treating Wilber Fini/Extender: [name] in Treatment: 8 Active Problems Location of Pain Severity and Description of Pain Patient Has Paino No Site Locations Pain Management and Medication Current Pain Management: Notes Topical or injectable lidocaine is offered to patient for acute pain when surgical debridement is performed. If needed, Patient is instructed to use over the counter pain medication for the following 24-48 hours after debridement. Wound care MDs do not prescribed pain medications. Patient has chronic pain or uncontrolled pain. Patient has been instructed to make an appointment with their Primary  Care Physician for pain management. Electronic Signature(s) Signed: 10/16/2016 5:13:29 PM By: [name] Entered By: [name] on 10/16/2016 14:46:59 [name], [name] ([id number]) -------------------------------------------------------------------------------- Patient/Caregiver Education Details Patient Name: [name] Date of Service: 10/16/2016 2:30 PM Medical Record Patient Account Number: [id number] Number: Treating RN: [name] Dec 17, 1940 (76 y.o. Other Clinician: Date of Birth/Gender: Female) Treating ROBSON, MICHAEL Primary Care Physician: [name] Physician/Extender: G Referring Physician: Bethanie Dicker in Treatment: 8 Education Assessment Education Provided To: Patient and Caregiver Education Topics Provided Wound/Skin Impairment: Handouts: Other: wound care as ordered Methods: Explain/Verbal Responses: State content correctly Electronic Signature(s) Signed: 10/16/2016 5:13:29 PM By: [name] Entered By: [name] on 10/16/2016 15:07:48 [name], [name] ([id number]) -------------------------------------------------------------------------------- Wound Assessment Details Patient Name: [name] Date of Service: 10/16/2016 2:30 PM Medical Record Number: [id number] Patient Account Number: [id number] Date of Birth/Sex: [birth year] (76 y.o. Female) Treating RN: [name] Primary Care Colvin Blatt: [name] Other Clinician: Referring Shiloh Swopes: [name] Treating Teven Mittman/Extender: [name] in Treatment: 8 Wound Status Wound Number: 2 Primary Diabetic Wound/Ulcer of the Lower Etiology: Extremity Wound Location: Left Calcaneus - Lateral Wound Open Wounding Event: Gradually Appeared Status: Date Acquired: 08/12/2016 Comorbid Anemia, Coronary Artery Disease, Weeks Of Treatment: 8 History: Hypertension, Myocardial Infarction, Clustered Wound: No Type II Diabetes,  Osteoarthritis, Neuropathy Photos Wound Measurements Length: (cm) 2.3 Width: (cm) 2.3 Depth: (cm) 0.2 Area: (cm) 4.155 Volume: (cm) 0.831 % Reduction in Area: 42.7% % Reduction in Volume: 42.7% Epithelialization: None Tunneling: No Undermining: No Wound Description Classification: Grade 1 Foul Odor  Afte Wound Margin: Flat and Intact Due to Product Exudate Amount: Large Slough/Fibrino Exudate Type: Serous Exudate Color: amber r Cleansing: Yes Use: No Yes Wound Bed Granulation Amount: Small (1-33%) Exposed Structure Granulation Quality: Red Fascia Exposed: No Necrotic Amount: Large (67-100%) Fat Layer (Subcutaneous Tissue) Exposed: No Necrotic Quality: Eschar, Adherent Slough Tendon Exposed: No Shugars, [name] B. ([id number]) Muscle Exposed: No Joint Exposed: No Bone Exposed: No Limited to Skin Breakdown Periwound Skin Texture Texture Color No Abnormalities Noted: No No Abnormalities Noted: No Callus: No Atrophie Blanche: No Crepitus: No Cyanosis: No Excoriation: No Ecchymosis: No Induration: No Erythema: No Rash: No Hemosiderin Staining: No Scarring: No Mottled: No Pallor: No Moisture Rubor: No No Abnormalities Noted: No Dry / Scaly: No Temperature / Pain Maceration: Yes Temperature: No Abnormality Tenderness on Palpation: Yes Wound Preparation Ulcer Cleansing: Rinsed/Irrigated with Saline Topical Anesthetic Applied: Other: lidocaine 4%, Treatment Notes Wound #2 (Left, Lateral Calcaneus) 1. Cleansed with: Cleanse wound with antibacterial soap and water 2. Anesthetic Topical Lidocaine 4% cream to wound bed prior to debridement 4. Dressing Applied: Iodoflex 5. Secondary Dressing Applied ABD Pad Dry Gauze 7. Secured with Other (specify in notes) Notes kerlix and coban wrap Electronic Signature(s) Signed: 10/16/2016 5:13:29 PM By: [name] Entered By: [name] on 10/16/2016 14:55:24 [name], [name]  ([id number]) -------------------------------------------------------------------------------- Vitals Details Patient Name: [name] Date of Service: 10/16/2016 2:30 PM Medical Record Number: [id number] Patient Account Number: [id number] Date of Birth/Sex: 1940/09/09 (76 y.o. Female) Treating RN: [name] Primary Care Jasiyah Paulding: [name] Other Clinician: Referring Bobbie Virden: [name] Treating Tysha Grismore/Extender: [name] in Treatment: 8 Vital Signs Time Taken: 14:47 Temperature (F): 98.0 Height (in): 63 Pulse (bpm): 67 Weight (lbs): 205 Respiratory Rate (breaths/min): 18 Body Mass Index (BMI): 36.3 Blood Pressure (mmHg): 140/57 Reference Range: 80 - 120 mg / dl Electronic Signature(s) Signed: 10/16/2016 5:13:29 PM By: [name] Entered By: [name] on 10/16/2016 14:48:18

## 2016-10-18 NOTE — Progress Notes (Signed)
[name] ([id number]) Visit Report for 10/16/2016 Chief Complaint Document Details Patient Name: [name] Date of Service: 10/16/2016 2:30 PM Medical Record Patient Account Number: [id number] Number: Treating RN: [name] 08/09/1941 (76 y.o. Other Clinician: Date of Birth/Sex: Female) Treating [name] Primary Care Provider: Leighton [name] Provider/Extender: G Referring Provider: Bethanie [name] in Treatment: 8 Information Obtained from: Patient Chief Complaint Patient is here for review of a wound on the left heel Electronic Signature(s) Signed: 10/17/2016 5:01:50 PM By: [name] Ham MD Entered By: [name] on 10/16/2016 15:35:45 [name], [name] ([id number]) -------------------------------------------------------------------------------- Debridement Details Patient Name: [name] Date of Service: 10/16/2016 2:30 PM Medical Record Patient Account Number: [id number] Number: Treating RN: [name] 01/18/1941 (76 y.o. Other Clinician: Date of Birth/Sex: Female) Treating [name], [place] Primary Care Provider: Leighton [name] Provider/Extender: G Referring Provider: Bethanie [name] in Treatment: 8 Debridement Performed for Wound #2 Left,Lateral Calcaneus Assessment: Performed By: Physician [name] Dillon, MD Debridement: Debridement Pre-procedure Yes - 15:00 Verification/Time Out Taken: Start Time: 15:00 Pain Control: Lidocaine 4% Topical Solution Level: Skin/Subcutaneous Tissue Total Area Debrided (L x 2.3 (cm) x 2.3 (cm) = 5.29 (cm) W): Tissue and other Viable, Non-Viable, Fibrin/Slough, Subcutaneous material debrided: Instrument: Curette Bleeding: Moderate Hemostasis Achieved: Pressure End Time: 15:03 Procedural Pain: 0 Post Procedural Pain: 0 Response to Treatment: Procedure was tolerated well Post Debridement Measurements of Total Wound Length: (cm) 2.3 Width: (cm)  2.3 Depth: (cm) 0.3 Volume: (cm) 1.246 Character of Wound/Ulcer Post Improved Debridement: Severity of Tissue Post Debridement: Fat layer exposed Post Procedure Diagnosis Same as Pre-procedure Electronic Signature(s) Signed: 10/16/2016 5:13:29 PM By: [name] Signed: 10/17/2016 5:01:50 PM By: [name] Ham MD [name] ([id number]) Entered By: [name] on 10/16/2016 15:35:32 [name], [name] ([id number]) -------------------------------------------------------------------------------- HPI Details Patient Name: [name] Date of Service: 10/16/2016 2:30 PM Medical Record Patient Account Number: [id number] Number: Treating RN: [name] October 27, 1940 (76 y.o. Other Clinician: Date of Birth/Sex: Female) Treating Raquon Milledge Primary Care Provider: Leighton [name] Provider/Extender: G Referring Provider: Bethanie [name] in Treatment: 8 History of Present Illness HPI Description: 08/21/16; [name] is a 76 year old woman who I cared for in our [facility] clinic up until January 2016. At that point she had a wound in her left great toe. 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 He follows with vein and vascular and has a follow-up appointment in early February. She is status left femoral to anterior tibial bypass in February 2016. She is also noted to have venous stasis changes and a history of ulcers. 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 fib on Eliquis. She has bladder cancer and has a chronic urostomy. The patient tells me that 2 weeks ago she had a right prosthesis and a new left diabetic shoe. Roughly one week ago she noted a  wound on her left lateral heel. She zinc oxide and some silver alginate she had left over until she could be seen here. It was also noted that she has had blistering on her posterior left calf and an open area here as well. Her records note that she has chronic venous insufficiency with  chronic inflammation especially in the left mid calf area. The patient has not been systemically unwell no fever or chills. She is reasonably insensate and has not felt any pain 09/04/16; the patient has a fairly substantial wound on the left lateral calcaneus. We have been using Santyl to this area. Blister on the left lower leg appears to of healed the patient also follows with Dr. Sherren Mocha early of vein and vascular. He felt that she should have enough blood flow to heal her left heel wound. He renewed her doxycycline for a further week. She is status post left femoral to anterior tibial bypass in February 2016. 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 She has a remote history of a right BKA. The XRAY that I ordered last week showed no definite evidence of osteomyelitis however a small focus of demineralization was noted in the plantar aspect of the calcaneus which might warrant an MRI possibly indicating early osteomyelitis however this is not in the area of her wound and I'm going to keep this under advisement for now. 09/11/16; still a substantial wound in the left lateral calcaneus we've been using Santyl 09/18/16; still a substantial wound on the left lateral calcaneus. We are still using Santyl 09/24/16; still a substantial wound on the left lateral calcaneus. We're using Santyl change one or 2 times a week and mechanical debridement which he comes to [place]. 10/02/16; left lateral calcaneus using Santyl changing 3 times a week. Run Apligraf through her insurance 10/09/16; still using Santyl without much change. Being changed 2 times a week by the patient and once Abello, Skarleth B. ([id number]) here. We did  not run Apligraf last week we'll do that this week 10/16/16; the area on her left lateral heel; started Iodoflex last week. Apligraf set a co-pay of $per for application Electronic Signature(s) Signed: 10/17/2016 5:01:50 PM By: [name] Ham MD Entered By: [name] on 10/16/2016 15:36:47 Houchen, [name] ([id number]) -------------------------------------------------------------------------------- Physical Exam Details Patient Name: [name] Date of Service: 10/16/2016 2:30 PM Medical Record Patient Account Number: [id number] Number: Treating RN: [name] 02/17/1941 (76 y.o. Other Clinician: Date of Birth/Sex: Female) Treating Murlin Schrieber Primary Care Provider: Leighton [name] Provider/Extender: G Referring Provider: Bethanie [name] in Treatment: 8 Constitutional Sitting or standing Blood Pressure is within target range for patient.. Pulse regular and within target range for patient.Marland Kitchen Respirations regular, non-labored and within target range.. Temperature is normal and within the target range for the patient.. Patient's appearance is neat and clean. Appears in no acute distress. Well nourished and well developed.. Notes Wound exam; lateral plantar heel. Wound still has a thick adherent necrotic surface material. Debrided with a #3 curet. Hemostasis with direct pressure, she tolerates this reasonably well. There is no evidence of overt surrounding soft tissue infection Electronic Signature(s) Signed: 10/17/2016 5:01:50 PM By: [name] Ham MD Entered By: [name] on 10/16/2016 15:38:05 Istre, [name] ([id number]) -------------------------------------------------------------------------------- Physician Orders Details Patient Name: [name] Date of Service: 10/16/2016 2:30 PM Medical Record Patient Account Number: [id number] Number: Treating RN: [name] 1941/05/18 (76 y.o. Other Clinician: Date of  Birth/Sex: Female) Treating Emmerich Cryer Primary Care Provider: Leighton [name] Provider/Extender: G Referring Provider: Bethanie [name] in Treatment: 8 Verbal / Phone Orders: No Diagnosis Coding Wound Cleansing Wound #2 Left,Lateral Calcaneus o Clean wound with Normal Saline. Anesthetic Wound #2 Left,Lateral Calcaneus o Topical Lidocaine 4% cream applied to wound bed prior to debridement Primary Wound Dressing Wound #2 Left,Lateral Calcaneus o  Iodoflex Secondary Dressing Wound #2 Left,Lateral Calcaneus o ABD pad o Dry Gauze Dressing Change Frequency Wound #2 Left,Lateral Calcaneus o Other: - twice weekly - home nurse to change wrap when she visits (may change more than twice weekly if nurse visits more often) Follow-up Appointments Wound #2 Left,Lateral Calcaneus o Return Appointment in 1 week. Edema Control Wound #2 Left,Lateral Calcaneus o Kerlix and Coban - Left Lower Extremity Off-Loading Wound #2 Left,Lateral Calcaneus o Other: - open heel shoe Mowatt, Jeanmarie B. ([id number]) Additional Orders / Instructions Wound #2 Left,Lateral Calcaneus o Increase protein intake. Electronic Signature(s) Signed: 10/16/2016 5:13:29 PM By: [name] Signed: 10/17/2016 5:01:50 PM By: [name] Ham MD Entered By: [name] on 10/16/2016 15:04:46 Plascencia, [name] ([id number]) -------------------------------------------------------------------------------- Problem List Details Patient Name: [name] Date of Service: 10/16/2016 2:30 PM Medical Record Patient Account Number: [id number] Number: Treating RN: [name] [birth year] (76 y.o. Other Clinician: Date of Birth/Sex: Female) Treating Arren Laminack Primary Care Provider: Leighton [name] Provider/Extender: G Referring Provider: Bethanie [name] in Treatment: 8 Active Problems ICD-10 Encounter Code Description Active Date Diagnosis E11.621 Type 2  diabetes mellitus with foot ulcer 08/21/2016 Yes E11.51 Type 2 diabetes mellitus with diabetic peripheral 08/21/2016 Yes angiopathy without gangrene E11.40 Type 2 diabetes mellitus with diabetic neuropathy, 08/21/2016 Yes unspecified L97.423 Non-pressure chronic ulcer of left heel and midfoot with 08/21/2016 Yes necrosis of muscle I87.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 hypertension (idiopathic) with 08/21/2016 Yes inflammation of left lower extremity L97.221 Non-pressure chronic ulcer of left calf limited to 08/21/2016 Yes breakdown of skin L03.116 Cellulitis of left lower limb 08/21/2016 Yes Inactive Problems Resolved Problems Electronic Signature(s) JUSTISS, GERBINO ([id number]) Signed: 10/17/2016 5:01:50 PM By: [name] Ham MD Entered By: [name] on 10/16/2016 15:35:01 Daughenbaugh, [name] ([id number]) -------------------------------------------------------------------------------- Progress Note Details Patient Name: [name] Date of Service: 10/16/2016 2:30 PM Medical Record Patient Account Number: [id number] Number: Treating RN: [name] [birth year] (76 y.o. Other Clinician: Date of Birth/Sex: Female) Treating Ivie Savitt Primary Care Provider: Leighton [name] Provider/Extender: G Referring Provider: Bethanie [name] in Treatment: 8 Subjective Chief Complaint Information obtained from Patient Patient is here for review of a wound on the left heel History of Present Illness (HPI) 08/21/16; Mrs. Distel is a 76 year old woman who I cared for in our [facility] clinic up until January 2016. At that point she had a wound in her left great toe. 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 He follows with vein and vascular and has a follow-up appointment in early February. She is status left femoral to  anterior tibial bypass in February 2016. She is also noted to have venous stasis changes and a history of ulcers. 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 fib on Eliquis. She has bladder cancer and has a chronic urostomy. The patient tells me that 2 weeks ago she had a right prosthesis and a new left diabetic shoe. Roughly one week ago she noted a wound on her left lateral heel. She zinc oxide and some silver alginate she had left over until she could be seen here. It was also noted that she has had blistering on her posterior left calf and an open  area here as well. Her records note that she has chronic venous insufficiency with chronic inflammation especially in the left mid calf area. The patient has not been systemically unwell no fever or chills. She is reasonably insensate and has not felt any pain 09/04/16; the patient has a fairly substantial wound on the left lateral calcaneus. We have been using Santyl to this area. Blister on the left lower leg appears to of healed the patient also follows with Dr. Sherren Mocha early of vein and vascular. He felt that she should have enough blood flow to heal her left heel wound. He renewed her doxycycline for a further week. She is status post left femoral to anterior tibial bypass in February 2016. 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 She has a remote history of a right BKA. The XRAY that I ordered last week showed no definite evidence of osteomyelitis however a small focus of demineralization was noted in the plantar aspect of the calcaneus which might warrant an MRI possibly indicating early osteomyelitis however this is not in the area of her wound and I'm going to keep this under advisement for now. 09/11/16; still a substantial wound in the left lateral calcaneus we've been using Santyl Swicegood, Henrine B.  ([id number]) 09/18/16; still a substantial wound on the left lateral calcaneus. We are still using Santyl 09/24/16; still a substantial wound on the left lateral calcaneus. We're using Santyl change one or 2 times a week and mechanical debridement which he comes to [place]. 10/02/16; left lateral calcaneus using Santyl changing 3 times a week. Run Apligraf through her insurance 10/09/16; still using Santyl without much change. Being changed 2 times a week by the patient and once here. We did not run Apligraf last week we'll do that this week 10/16/16; the area on her left lateral heel; started Iodoflex last week. Apligraf set a co-pay of $per for application Objective Constitutional Sitting or standing Blood Pressure is within target range for patient.. Pulse regular and within target range for patient.Marland Kitchen Respirations regular, non-labored and within target range.. Temperature is normal and within the target range for the patient.. Patient's appearance is neat and clean. Appears in no acute distress. Well nourished and well developed.. Vitals Time Taken: 2:47 PM, Height: 63 in, Weight: 205 lbs, BMI: 36.3, Temperature: 98.0 F, Pulse: 67 bpm, Respiratory Rate: 18 breaths/min, Blood Pressure: 140/57 mmHg. General Notes: Wound exam; lateral plantar heel. Wound still has a thick adherent necrotic surface material. Debrided with a #3 curet. Hemostasis with direct pressure, she tolerates this reasonably well. There is no evidence of overt surrounding soft tissue infection Integumentary (Hair, Skin) Wound #2 status is Open. Original cause of wound was Gradually Appeared. The wound is located on the Left,Lateral Calcaneus. The wound measures 2.3cm length x 2.3cm width x 0.2cm depth; 4.155cm^2 area and 0.831cm^3 volume. The wound is limited to skin breakdown. There is no tunneling or undermining noted. There is a large amount of serous drainage noted. The wound margin is flat and intact. There is small (1-33%) red  granulation within the wound bed. There is a large (67-100%) amount of necrotic tissue within the wound bed including Eschar and Adherent Slough. The periwound skin appearance exhibited: Maceration. The periwound skin appearance did not exhibit: Callus, Crepitus, Excoriation, Induration, Rash, Scarring, Dry/Scaly, Atrophie Blanche, Cyanosis, Ecchymosis, Hemosiderin Staining, Mottled, Pallor, Rubor, Erythema. Periwound temperature was noted as No Abnormality. The periwound has tenderness on palpation. Assessment Active Problems SELENNE, COGGIN B. 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 There was a Skin/Subcutaneous Tissue Debridement ([id number]) debridement with total area of 5.29 sq cm performed by [name] Dillon, MD. with the following instrument(s): Curette to remove Viable and Non-Viable tissue/material including Fibrin/Slough and Subcutaneous after achieving pain control using Lidocaine 4% Topical Solution. A time out was conducted at 15:00, prior to the start of the procedure. A Moderate amount of bleeding was controlled with Pressure. The procedure was tolerated well with a pain level of 0 throughout and a pain level of 0 following the procedure. Post Debridement Measurements: 2.3cm length x 2.3cm width x 0.3cm depth; 1.246cm^3 volume. Character of Wound/Ulcer Post Debridement is  improved. Severity of Tissue Post Debridement is: Fat layer exposed. Post procedure Diagnosis Wound #2: Same as Pre-Procedure Plan Wound Cleansing: Wound #2 Left,Lateral Calcaneus: Clean wound with Normal Saline. Anesthetic: Wound #2 Left,Lateral Calcaneus: Topical Lidocaine 4% cream applied to wound bed prior to debridement Primary Wound Dressing: Wound #2 Left,Lateral Calcaneus: Iodoflex Secondary Dressing: Wound #2 Left,Lateral Calcaneus: ABD pad Dry Gauze Dressing Change Frequency: Willy, Tamica B. ([id number]) Wound #2 Left,Lateral Calcaneus: Other: - twice weekly - home nurse to change wrap when she visits (may change more than twice weekly if nurse visits more often) Follow-up Appointments: Wound #2 Left,Lateral Calcaneus: Return Appointment in 1 week. Edema Control: Wound #2 Left,Lateral Calcaneus: Kerlix and Coban - Left Lower Extremity Off-Loading: Wound #2 Left,Lateral Calcaneus: Other: - open heel shoe Additional Orders / Instructions: Wound #2 Left,Lateral Calcaneus: Increase protein intake. continue iodoflex patient agrees to [facility] but not ready for this yet precariously close to bone, plane xray negative for osteo Vein and vascular thought she had enough circulation ot heel this Electronic Signature(s) Signed: 10/17/2016 5:01:50 PM By: [name] Ham MD Entered By: [name] on 10/16/2016 15:39:37 Enis, [name] ([id number]) -------------------------------------------------------------------------------- SuperBill Details Patient Name: [name] Date of Service: 10/16/2016 Medical Record Patient Account Number: [id number] Number: Treating RN: [name] May 07, 1941 (76 y.o. 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 I87.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 L97.221 Non-pressure chronic ulcer of left calf limited to breakdown of skin L03.116 Cellulitis of left lower limb Facility Procedures CPT4 Code Description: [id number] 11042 - DEB SUBQ TISSUE 20 SQ CM/< ICD-10 Description Diagnosis E11.621 Type 2 diabetes mellitus with foot ulcer L97.423 Non-pressure chronic ulcer of left heel and midfoot Modifier: with necrosis Quantity: 1 of muscle Physician Procedures CPT4 Code Description: [id number] 24580 - WC PHYS SUBQ TISS 20 SQ CM ICD-10 Description Diagnosis E11.621 Type 2 diabetes mellitus with foot ulcer L97.423 Non-pressure chronic  ulcer of left heel and midfoot Modifier: with necrosis Quantity: 1 of muscle Electronic Signature(s) Signed: 10/17/2016 5:01:50 PM By: [name] Ham MD Entered By: [name] on 10/16/2016 15:40:00

## 2016-10-23 ENCOUNTER — Encounter: Payer: PPO | Admitting: Internal Medicine

## 2016-10-23 ENCOUNTER — Other Ambulatory Visit: Payer: Self-pay | Admitting: Pharmacist

## 2016-10-23 ENCOUNTER — Encounter: Payer: Self-pay | Admitting: Pharmacist

## 2016-10-23 DIAGNOSIS — L97422 Non-pressure chronic ulcer of left heel and midfoot with fat layer exposed: Secondary | ICD-10-CM | POA: Diagnosis not present

## 2016-10-23 DIAGNOSIS — E11621 Type 2 diabetes mellitus with foot ulcer: Secondary | ICD-10-CM | POA: Diagnosis not present

## 2016-10-23 NOTE — Patient Outreach (Signed)
[facility]) Care Management  10/23/2016  [name] 21-Jul-1941 [id number]   Called patient to follow up on insulin therapy.  HIPAA identifiers were obtained. Patient confirmed she went to see Dr. Drema Dallas on 10/18/16.  Patient stated Dr. Drema Dallas decreased her Lantus dose to 40 units in the morning and 40 units in the evening.  She said her Novolog dose was left as was prescribed by Bradd Canary, NP:  151-200 - 5 units, 201-250 - 10 units, 251 - 300 - 15 units and >301 20 units.   Patient said she has still had a few low blood sugars in the upper 60s so she does not always dose the Lantus at 40 units at bedtime.  When asked about Novolog dosing.  Patient reported using Novolog at bedtime sometimes if her blood sugar is elevated.  She was educated on how fast acting insulin works and that they should be dosed around a meal.  Giving fast acting insulin at bedtime could be causing some of her low blood sugars.  Hypoglycemia management was reviewed.    Patient confirmed she has called in a refill on both insulins ( Lantus and Novolog) and will be going to pick them up tomorrow after her [place] appointment.  Patient was encouraged to continue to be compliant to insulin therapy as controlling her blood sugars in a major component to her wounds healing.  Plan:  1.  Route note to Bradd Canary, RN  2.  Follow up with the patient 1 month on the timing of insulin therapy.  3.  Since Arbie Cookey is following the patient from a diabetes perspective, close pharmacy case if insulin therapy is fine at next telephone appointment.  [name], PharmD, [place] Clinical Pharmacist [phone]

## 2016-10-24 NOTE — Progress Notes (Signed)
GRAYSON, WHITE ([id number]) Visit Report for 10/23/2016 Arrival Information Details Patient Name: [name], [name] Date of Service: 10/23/2016 3:30 PM Medical Record Number: [id number] Patient Account Number: [id number] Date of Birth/Sex: [birth year] (76 y.o. Female) Treating RN: Montey Hora Primary Care Marketia Stallsmith: Leighton Ruff Other Clinician: Referring Jaszmine Navejas: Leighton Ruff Treating Astin Sayre/Extender: Tito Dine in Treatment: 9 Visit Information History Since Last Visit Added or deleted any medications: No Patient Arrived: Wheel Chair Any new allergies or adverse reactions: No Arrival Time: 15:23 Had a fall or experienced change in No Accompanied By: friend activities of daily living that may affect Transfer Assistance: None risk of falls: Patient Identification Verified: Yes Signs or symptoms of abuse/neglect since last No Secondary Verification Process Yes visito Completed: Hospitalized since last visit: No Patient Has Alerts: Yes Has Dressing in Place as Prescribed: Yes Patient Alerts: Patient on Blood Has Compression in Place as Prescribed: Yes Thinner Pain Present Now: No eliquis DMII ABI [place] >220 Electronic Signature(s) Signed: 10/23/2016 5:29:48 PM By: Montey Hora Entered By: Montey Hora on 10/23/2016 15:24:11 Aguirre, [name] ([id number]) -------------------------------------------------------------------------------- Encounter Discharge Information Details Patient Name: [name] Date of Service: 10/23/2016 3:30 PM Medical Record Number: [id number] Patient Account Number: [id number] Date of Birth/Sex: 01-01-1941 (75 y.o. Female) Treating RN: Montey Hora Primary Care Daiel Strohecker: Leighton Ruff Other Clinician: Referring Saidah Kempton: Leighton Ruff Treating Findlay Dagher/Extender: Tito Dine in Treatment: 9 Encounter Discharge Information Items Discharge Pain Level: 0 Discharge Condition:  Stable Ambulatory Status: Wheelchair Discharge Destination: Home Transportation: Private Auto Accompanied By: friend Schedule Follow-up Appointment: Yes Medication Reconciliation completed and provided to Patient/Care No Lucilia Yanni: Provided on Clinical Summary of Care: 10/23/2016 Form Type Recipient Paper Patient BI Electronic Signature(s) Signed: 10/23/2016 4:32:19 PM By: Montey Hora Previous Signature: 10/23/2016 4:03:39 PM Version By: Ruthine Dose Entered By: Montey Hora on 10/23/2016 16:32:19 Willmon, [name] ([id number]) -------------------------------------------------------------------------------- Lower Extremity Assessment Details Patient Name: [name] Date of Service: 10/23/2016 3:30 PM Medical Record Number: [id number] Patient Account Number: [id number] Date of Birth/Sex: 02/27/1941 (76 y.o. Female) Treating RN: Montey Hora Primary Care Vivan Vanderveer: Leighton Ruff Other Clinician: Referring Precious Segall: Leighton Ruff Treating Tiahna Cure/Extender: Tito Dine in Treatment: 9 Vascular Assessment Pulses: Dorsalis Pedis Palpable: [Right:Yes] Posterior Tibial Extremity colors, hair growth, and conditions: Extremity Color: [Right:Hyperpigmented] Hair Growth on Extremity: [Right:No] Temperature of Extremity: [Right:Warm] Capillary Refill: [Right:< 3 seconds] Electronic Signature(s) Signed: 10/23/2016 5:29:48 PM By: Montey Hora Entered By: Montey Hora on 10/23/2016 15:46:50 Pandya, [name] ([id number]) -------------------------------------------------------------------------------- Multi Wound Chart Details Patient Name: [name] Date of Service: 10/23/2016 3:30 PM Medical Record Number: [id number] Patient Account Number: [id number] Date of Birth/Sex: 03/30/1941 (76 y.o. 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] Comorbid History: [2:Anemia, Coronary Artery N/A Disease, Hypertension, Myocardial Infarction, Type II Diabetes, Osteoarthritis, Neuropathy] Date Acquired: [2:08/12/2016] [N/A:N/A] Weeks of Treatment: [2:9] [N/A:N/A] Wound Status: [2:Open] [N/A:N/A] Measurements L x W x D 2.4x2.5x0.2 [N/A:N/A] (cm) Area (cm) : [2:4.712] [N/A:N/A] Volume (cm) : [2:0.942] [N/A:N/A] % Reduction in Area: [2:35.10%] [N/A:N/A] % Reduction in Volume: 35.10% [N/A:N/A] Classification: [2:Grade 1] [N/A:N/A] Exudate Amount: [2:Large] [N/A:N/A] Exudate Type: [  2:Serous] [N/A:N/A] Exudate Color: [2:amber] [N/A:N/A] Foul Odor After [2:Yes] [N/A:N/A] Cleansing: Odor Anticipated Due to No [N/A:N/A] Product Use: Wound Margin: [2:Flat and Intact] [N/A:N/A] Granulation Amount: [2:Medium (34-66%)] [N/A:N/A] Granulation Quality: [2:Red] [N/A:N/A] Necrotic Amount: Medium (34-66%) N/A N/A Necrotic Tissue: Eschar, Adherent Slough N/A N/A Exposed Structures: Fascia: No N/A N/A Fat Layer (Subcutaneous Tissue) Exposed: No Tendon: No Muscle: No Joint: No Bone: No Limited to Skin Breakdown Epithelialization: None N/A N/A Debridement: Debridement (61443- N/A N/A 11047) Pre-procedure 15:45 N/A N/A Verification/Time Out Taken: Pain Control: Lidocaine 4% Topical N/A N/A Solution Tissue Debrided: Fibrin/Slough, N/A N/A Subcutaneous Level: Skin/Subcutaneous N/A N/A Tissue Debridement Area (sq 6 N/A N/A cm): Instrument: Curette N/A N/A Bleeding: Minimum N/A N/A Hemostasis  Achieved: Pressure N/A N/A Procedural Pain: 0 N/A N/A Post Procedural Pain: 0 N/A N/A Debridement Treatment Procedure was tolerated N/A N/A Response: well Post Debridement 2.4x2.5x0.2 N/A N/A Measurements L x W x D (cm) Post Debridement 0.942 N/A N/A Volume: (cm) Periwound Skin Texture: Excoriation: No N/A N/A Induration: No Callus: No Crepitus: No Rash: No Scarring: No Periwound Skin Maceration: Yes N/A N/A Moisture: Dry/Scaly: No Periwound Skin Color: Atrophie Blanche: No N/A N/A Cyanosis: No Ecchymosis: No Erythema: No Hemosiderin Staining: No Hirsch, Annagrace B. ([id number]) Mottled: No Pallor: No Rubor: No Temperature: No Abnormality N/A N/A Tenderness on Yes N/A N/A Palpation: Wound Preparation: Ulcer Cleansing: N/A N/A Rinsed/Irrigated with Saline Topical Anesthetic Applied: Other: lidocaine 4% Procedures Performed: Debridement N/A N/A Treatment Notes Electronic Signature(s) Signed: 10/23/2016 4:24:49 PM By: Linton Ham MD Entered By: Linton Ham on 10/23/2016 15:54:08 Leahy, [name] ([id number]) -------------------------------------------------------------------------------- Multi-Disciplinary Care Plan Details Patient Name: [name] Date of Service: 10/23/2016 3:30 PM Medical Record Number: [id number] Patient Account Number: [id number] Date of Birth/Sex: 08-26-1940 (76 y.o. Female) Treating RN: Montey Hora Primary Care Ugochukwu Chichester: Leighton Ruff Other Clinician: Referring Florice Hindle: Leighton Ruff Treating Kandy Towery/Extender: Tito Dine in Treatment: 9 Active Inactive ` Abuse / Safety / Falls / Self Care Management Nursing Diagnoses: Potential for falls Goals: Patient will remain injury free Date Initiated: 08/21/2016 Target Resolution Date: 10/08/2016 Goal Status: Active Interventions: Assess fall risk on admission and as needed Notes: ` Necrotic Tissue Nursing Diagnoses: Impaired tissue integrity  related to necrotic/devitalized tissue Goals: Necrotic/devitalized tissue will be minimized in the wound bed Date Initiated: 08/21/2016 Target Resolution Date: 10/08/2016 Goal Status: Active Interventions: Assess patient pain level pre-, during and post procedure and prior to discharge Notes: ` Orientation to the Wound Care Program Nursing Diagnoses: Knowledge deficit related to the wound healing center program BEKKA, QIAN ([id number]) Goals: Patient/caregiver will verbalize understanding of the [place] Date Initiated: 08/21/2016 Target Resolution Date: 10/08/2016 Goal Status: Active Interventions: Provide education on orientation to the wound center Notes: ` Peripheral Neuropathy Nursing Diagnoses: Potential alteration in peripheral tissue perfusion (select prior to confirmation of diagnosis) Goals: Patient/caregiver will verbalize understanding of disease process and disease management Date Initiated: 08/21/2016 Target Resolution Date: 10/08/2016 Goal Status: Active Interventions: Assess signs and symptoms of neuropathy upon admission and as needed Notes: ` Venous Leg Ulcer Nursing Diagnoses: Actual venous Insuffiency (use after diagnosis is confirmed) Goals: Patient will maintain optimal edema control Date Initiated: 08/21/2016 Target Resolution Date: 10/08/2016 Goal Status: Active Interventions: Assess peripheral edema status every visit. Notes: ` Wound/Skin Impairment Nursing Diagnoses: Impaired tissue integrity Vernon, Genise B. ([id number]) Goals: Patient/caregiver will verbalize understanding of skin care regimen Date Initiated: 08/21/2016 Target Resolution Date: 10/08/2016 Goal Status: Active Ulcer/skin breakdown will have a  volume reduction of 30% by week 4 Date Initiated: 08/21/2016 Target Resolution Date: 10/08/2016 Goal Status: Active Ulcer/skin breakdown will have a volume reduction of 50% by week 8 Date Initiated:  08/21/2016 Target Resolution Date: 10/29/2016 Goal Status: Active Ulcer/skin breakdown will have a volume reduction of 80% by week 12 Date Initiated: 08/21/2016 Target Resolution Date: 11/12/2016 Goal Status: Active Ulcer/skin breakdown will heal within 14 weeks Date Initiated: 08/21/2016 Target Resolution Date: 11/12/2016 Goal Status: Active Interventions: Assess patient/caregiver ability to obtain necessary supplies Assess patient/caregiver ability to perform ulcer/skin care regimen upon admission and as needed Assess ulceration(s) every visit Notes: Electronic Signature(s) Signed: 10/23/2016 5:29:48 PM By: Montey Hora Entered By: Montey Hora on 10/23/2016 15:47:00 Wyne, [name] ([id number]) -------------------------------------------------------------------------------- Pain Assessment Details Patient Name: [name] Date of Service: 10/23/2016 3:30 PM Medical Record Number: [id number] Patient Account Number: [id number] Date of Birth/Sex: [birth year] (76 y.o. Female) Treating RN: Montey Hora Primary Care Silver Parkey: Leighton Ruff Other Clinician: Referring Gaither Biehn: Leighton Ruff Treating Lexus Shampine/Extender: Tito Dine in Treatment: 9 Active Problems Location of Pain Severity and Description of Pain Patient Has Paino No Site Locations Pain Management and Medication Current Pain Management: Notes Topical or injectable lidocaine is offered to patient for acute pain when surgical debridement is performed. If needed, Patient is instructed to use over the counter pain medication for the following 24-48 hours after debridement. Wound care MDs do not prescribed pain medications. Patient has chronic pain or uncontrolled pain. Patient has been instructed to make an appointment with their Primary Care Physician for pain management. Electronic Signature(s) Signed: 10/23/2016 5:29:48 PM By: Montey Hora Entered By: Montey Hora on 10/23/2016  15:24:19 Flanigan, [name] ([id number]) -------------------------------------------------------------------------------- Patient/Caregiver Education Details Patient Name: [name] Date of Service: 10/23/2016 3:30 PM Medical Record Patient Account Number: [id number] Number: Treating RN: Montey Hora Mar 04, 1941 (76 y.o. Other Clinician: Date of Birth/Gender: Female) Treating ROBSON, MICHAEL Primary Care Physician: Leighton Ruff Physician/Extender: G Referring Physician: Bethanie Dicker in Treatment: 9 Education Assessment Education Provided To: Patient and Caregiver Education Topics Provided Wound/Skin Impairment: Handouts: Other: wound care as ordered Methods: Demonstration, Explain/Verbal Responses: State content correctly Electronic Signature(s) Signed: 10/23/2016 5:29:48 PM By: Montey Hora Entered By: Montey Hora on 10/23/2016 16:32:39 Hollar, [name] ([id number]) -------------------------------------------------------------------------------- Wound Assessment Details Patient Name: [name] Date of Service: 10/23/2016 3:30 PM Medical Record Number: [id number] Patient Account Number: [id number] Date of Birth/Sex: 1941/02/26 (77 y.o. Female) Treating RN: Montey Hora Primary Care Sherl Yzaguirre: Leighton Ruff Other Clinician: Referring Jelena Malicoat: Leighton Ruff Treating Trevionne Advani/Extender: Tito Dine in Treatment: 9 Wound Status Wound Number: 2 Primary Diabetic Wound/Ulcer of the Lower Etiology: Extremity Wound Location: Left Calcaneus - Lateral Wound Open Wounding Event: Gradually Appeared Status: Date Acquired: 08/12/2016 Comorbid Anemia, Coronary Artery Disease, Weeks Of Treatment: 9 History: Hypertension, Myocardial Infarction, Clustered Wound: No Type II Diabetes, Osteoarthritis, Neuropathy Photos Photo Uploaded By: Montey Hora on 10/23/2016 17:05:52 Wound Measurements Length: (cm) 2.4 Width:  (cm) 2.5 Depth: (cm) 0.2 Area: (cm) 4.712 Volume: (cm) 0.942 % Reduction in Area: 35.1% % Reduction in Volume: 35.1% Epithelialization: None Tunneling: No Undermining: No Wound Description Classification: Grade 1 Foul Odor Afte Wound Margin: Flat and Intact Due to Product Exudate Amount: Large Slough/Fibrino Exudate Type: Serous Exudate Color: amber r Cleansing: Yes Use: No Yes Wound Bed Granulation Amount: Medium (34-66%) Exposed Structure Granulation Quality: Red Fascia Exposed: No Necrotic Amount: Medium (34-66%) Fat Layer (Subcutaneous Tissue) Exposed: No Grandison, Jaxyn B. ([id number]) Necrotic Quality: Eschar,  Adherent Slough Tendon Exposed: No Muscle Exposed: No Joint Exposed: No Bone Exposed: No Limited to Skin Breakdown Periwound Skin Texture Texture Color No Abnormalities Noted: No No Abnormalities Noted: No Callus: No Atrophie Blanche: No Crepitus: No Cyanosis: No Excoriation: No Ecchymosis: No Induration: No Erythema: No Rash: No Hemosiderin Staining: No Scarring: No Mottled: No Pallor: No Moisture Rubor: No No Abnormalities Noted: No Dry / Scaly: No Temperature / Pain Maceration: Yes Temperature: No Abnormality Tenderness on Palpation: Yes Wound Preparation Ulcer Cleansing: Rinsed/Irrigated with Saline Topical Anesthetic Applied: Other: lidocaine 4%, Treatment Notes Wound #2 (Left, Lateral Calcaneus) 1. Cleansed with: Clean wound with Normal Saline 2. Anesthetic Topical Lidocaine 4% cream to wound bed prior to debridement 4. Dressing Applied: Iodoflex 5. Secondary Dressing Applied Guaze, ABD and kerlix/Conform 7. Secured with Tape Other (specify in notes) Notes kerlix and coban wrap Electronic Signature(s) Signed: 10/23/2016 5:29:48 PM By: Montey Hora Entered By: Montey Hora on 10/23/2016 15:44:24 Kinzie, [name] ([id number]) -------------------------------------------------------------------------------- Vitals  Details Patient Name: [name] Date of Service: 10/23/2016 3:30 PM Medical Record Number: [id number] Patient Account Number: [id number] Date of Birth/Sex: 12-22-1940 (76 y.o. Female) Treating RN: Montey Hora Primary Care Shantell Belongia: Leighton Ruff Other Clinician: Referring Emberlin Verner: Leighton Ruff Treating Kimberlyn Quiocho/Extender: Tito Dine in Treatment: 9 Vital Signs Time Taken: 15:24 Temperature (F): 97.9 Height (in): 63 Pulse (bpm): 63 Weight (lbs): 205 Respiratory Rate (breaths/min): 18 Body Mass Index (BMI): 36.3 Blood Pressure (mmHg): 131/44 Reference Range: 80 - 120 mg / dl Electronic Signature(s) Signed: 10/23/2016 5:29:48 PM By: Montey Hora Entered By: Montey Hora on 10/23/2016 10:03:49

## 2016-10-24 NOTE — Progress Notes (Signed)
MOMOKO, SLEZAK ([id number]) Visit Report for 10/23/2016 Chief Complaint Document Details Patient Name: [name], [name] Date of Service: 10/23/2016 3:30 PM Medical Record Patient Account Number: [id number] Number: Treating RN: Montey Hora 1941/02/16 (76 y.o. Other Clinician: Date of Birth/Sex: Female) Treating ROBSON, MICHAEL Primary Care Provider: Leighton Ruff Provider/Extender: G Referring Provider: Bethanie Dicker in Treatment: 9 Information Obtained from: Patient Chief Complaint Patient is here for review of a wound on the left heel Electronic Signature(s) Signed: 10/23/2016 4:24:49 PM By: Linton Ham MD Entered By: Linton Ham on 10/23/2016 15:54:42 [name], [name] ([id number]) -------------------------------------------------------------------------------- Debridement Details Patient Name: [name] Date of Service: 10/23/2016 3:30 PM Medical Record Patient Account Number: [id number] Number: Treating RN: Montey Hora 1941/01/11 (76 y.o. Other Clinician: Date of Birth/Sex: Female) Treating ROBSON, MICHAEL Primary Care Provider: Leighton Ruff Provider/Extender: G Referring Provider: Bethanie Dicker in Treatment: 9 Debridement Performed for Wound #2 Left,Lateral Calcaneus Assessment: Performed By: Physician Ricard Dillon, MD Debridement: Debridement Pre-procedure Yes - 15:45 Verification/Time Out Taken: Start Time: 15:45 Pain Control: Lidocaine 4% Topical Solution Level: Skin/Subcutaneous Tissue Total Area Debrided (L x 2.4 (cm) x 2.5 (cm) = 6 (cm) W): Tissue and other Viable, Non-Viable, Fibrin/Slough, Subcutaneous material debrided: Instrument: Curette Bleeding: Minimum Hemostasis Achieved: Pressure End Time: 15:47 Procedural Pain: 0 Post Procedural Pain: 0 Response to Treatment: Procedure was tolerated well Post Debridement Measurements of Total Wound Length: (cm) 2.4 Width: (cm)  2.5 Depth: (cm) 0.2 Volume: (cm) 0.942 Character of Wound/Ulcer Post Improved Debridement: Severity of Tissue Post Debridement: Fat layer exposed Post Procedure Diagnosis Same as Pre-procedure Electronic Signature(s) Signed: 10/23/2016 4:24:49 PM By: Linton Ham MD Signed: 10/23/2016 5:29:48 PM By: [name], [name] ([id number]) Entered By: Linton Ham on 10/23/2016 15:54:31 [name], [name] ([id number]) -------------------------------------------------------------------------------- HPI Details Patient Name: [name] Date of Service: 10/23/2016 3:30 PM Medical Record Patient Account Number: [id number] Number: Treating RN: Montey Hora May 13, 1941 (76 y.o. Other Clinician: Date of Birth/Sex: Female) Treating ROBSON, MICHAEL Primary Care Provider: Leighton Ruff Provider/Extender: G Referring Provider: Bethanie Dicker in Treatment: 9 History of Present Illness HPI Description: 08/21/16; [name] is a 76 year old woman who I cared for in our [facility] clinic up until January 2016. At that point she had a wound in her left great toe. 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 He follows with vein and vascular and has a follow-up appointment in early February. She is status left femoral to anterior tibial bypass in February 2016. She is also noted to have venous stasis changes and a history of ulcers. 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 fib on Eliquis. She has bladder cancer and has a chronic urostomy. The patient tells me that 2 weeks ago she had a right prosthesis and a new left diabetic shoe. Roughly one week ago she noted a  wound on her left lateral heel. She zinc oxide and some silver alginate she had left over until she could be seen here. It was also noted that she has had blistering on her posterior left calf and an open area here as well. Her records note that she has chronic venous insufficiency with  chronic inflammation especially in the left mid calf area. The patient has not been systemically unwell no fever or chills. She is reasonably insensate and has not felt any pain 09/04/16; the patient has a fairly substantial wound on the left lateral calcaneus. We have been using Santyl to this area. Blister on the left lower leg appears to of healed the patient also follows with Dr. Sherren Mocha early of vein and vascular. He felt that she should have enough blood flow to heal her left heel wound. He renewed her doxycycline for a further week. She is status post left femoral to anterior tibial bypass in February 2016. 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 She has a remote history of a right BKA. The XRAY that I ordered last week showed no definite evidence of osteomyelitis however a small focus of demineralization was noted in the plantar aspect of the calcaneus which might warrant an MRI possibly indicating early osteomyelitis however this is not in the area of her wound and I'm going to keep this under advisement for now. 09/11/16; still a substantial wound in the left lateral calcaneus we've been using Santyl 09/18/16; still a substantial wound on the left lateral calcaneus. We are still using Santyl 09/24/16; still a substantial wound on the left lateral calcaneus. We're using Santyl change one or 2 times a week and mechanical debridement which he comes to [place]. 10/02/16; left lateral calcaneus using Santyl changing 3 times a week. Run Apligraf through her insurance 10/09/16; still using Santyl without much change. Being changed 2 times a week by the patient and once [place], Thelda B. ([id number]) here. We did  not run Apligraf last week we'll do that this week 10/16/16; the area on her left lateral heel; started Iodoflex last week. Apligraf set a co-pay of $250 per for application 5/39/76; left lateral heel. on iodoflex. approved for appligraft, start next week Electronic Signature(s) Signed: 10/23/2016 4:24:49 PM By: Linton Ham MD Entered By: Linton Ham on 10/23/2016 15:55:39 [name], [name] ([id number]) -------------------------------------------------------------------------------- Physical Exam Details Patient Name: [name] Date of Service: 10/23/2016 3:30 PM Medical Record Patient Account Number: [id number] Number: Treating RN: Montey Hora 1941-05-02 (76 y.o. Other Clinician: Date of Birth/Sex: Female) Treating ROBSON, MICHAEL Primary Care Provider: Leighton Ruff Provider/Extender: G Referring Provider: Bethanie Dicker in Treatment: 9 Constitutional Sitting or standing Blood Pressure is within target range for patient.. Pulse regular and within target range for patient.Marland Kitchen Respirations regular, non-labored and within target range.. Patient's appearance is neat and clean. Appears in no acute distress. Well nourished and well developed.. Notes Wound exam; lateral plantar heel .easy debridement of necrotic surface suing a #3 curret. healthy granulated surface. no evidence of surrounding infection Electronic Signature(s) Signed: 10/23/2016 4:24:49 PM By: Linton Ham MD Entered By: Linton Ham on 10/23/2016 15:57:17 [name], [name] ([id number]) -------------------------------------------------------------------------------- Physician Orders Details Patient Name: [name] Date of Service: 10/23/2016 3:30 PM Medical Record Patient Account Number: [id number] Number: Treating RN: Montey Hora [birth year] (76 y.o. Other Clinician: Date of Birth/Sex: Female) Treating ROBSON, MICHAEL Primary Care Provider: Leighton Ruff Provider/Extender: G Referring Provider: Bethanie Dicker in Treatment: 9 Verbal / Phone Orders: No Diagnosis Coding Wound Cleansing Wound #2 Left,Lateral Calcaneus o Clean wound with Normal Saline. Anesthetic Wound #2 Left,Lateral Calcaneus o Topical Lidocaine 4% cream applied to wound bed prior to debridement Primary Wound Dressing Wound #2 Left,Lateral Calcaneus o Iodoflex Secondary Dressing Wound #2 Left,Lateral Calcaneus o ABD pad o Dry Gauze Dressing  Change Frequency Wound #2 Left,Lateral Calcaneus o Other: - twice weekly - home nurse to change wrap when she visits (may change more than twice weekly if nurse visits more often) Follow-up Appointments Wound #2 Left,Lateral Calcaneus o Return Appointment in 1 week. Edema Control Wound #2 Left,Lateral Calcaneus o Kerlix and Coban - Left Lower Extremity Off-Loading Wound #2 Left,Lateral Calcaneus o Other: - open heel shoe Maclay, Miles B. ([id number]) Additional Orders / Instructions Wound #2 Left,Lateral Calcaneus o Increase protein intake. Electronic Signature(s) Signed: 10/23/2016 4:24:49 PM By: Linton Ham MD Signed: 10/23/2016 5:29:48 PM By: Montey Hora Entered By: Montey Hora on 10/23/2016 15:48:55 [name], [name] ([id number]) -------------------------------------------------------------------------------- Problem List Details Patient Name: [name] Date of Service: 10/23/2016 3:30 PM Medical Record Patient Account Number: [id number] Number: Treating RN: Montey Hora [birth year] (76 y.o. Other Clinician: Date of Birth/Sex: Female) Treating ROBSON, MICHAEL Primary Care Provider: Leighton Ruff Provider/Extender: G Referring Provider: Bethanie Dicker in Treatment: 9 Active Problems ICD-10 Encounter Code Description Active Date Diagnosis E11.621 Type 2 diabetes mellitus with foot ulcer 08/21/2016 Yes E11.51 Type 2 diabetes  mellitus with diabetic peripheral 08/21/2016 Yes angiopathy without gangrene E11.40 Type 2 diabetes mellitus with diabetic neuropathy, 08/21/2016 Yes unspecified L97.423 Non-pressure chronic ulcer of left heel and midfoot with 08/21/2016 Yes necrosis of muscle I87.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 hypertension (idiopathic) with 08/21/2016 Yes inflammation of left lower extremity L97.221 Non-pressure chronic ulcer of left calf limited to 08/21/2016 Yes breakdown of skin L03.116 Cellulitis of left lower limb 08/21/2016 Yes Inactive Problems Resolved Problems Electronic Signature(s) [name], [name] ([id number]) Signed: 10/23/2016 4:24:49 PM By: Linton Ham MD Entered By: Linton Ham on 10/23/2016 15:54:00 [name], [name] ([id number]) -------------------------------------------------------------------------------- Progress Note Details Patient Name: [name] Date of Service: 10/23/2016 3:30 PM Medical Record Patient Account Number: [id number] Number: Treating RN: Montey Hora October 19, 1940 (76 y.o. Other Clinician: Date of Birth/Sex: Female) Treating ROBSON, MICHAEL Primary Care Provider: Leighton Ruff Provider/Extender: G Referring Provider: Bethanie Dicker in Treatment: 9 Subjective Chief Complaint Information obtained from Patient Patient is here for review of a wound on the left heel History of Present Illness (HPI) 08/21/16; [name] is a 76 year old woman who I cared for in our [facility] clinic up until January 2016. At that point she had a wound in her left great toe. 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 He follows with vein and vascular and has a follow-up appointment in early February. She is status left femoral to anterior tibial bypass in February 2016. She is also noted to have  venous stasis changes and a history of ulcers. 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 fib on Eliquis. She has bladder cancer and has a chronic urostomy. The patient tells me that 2 weeks ago she had a right prosthesis and a new left diabetic shoe. Roughly one week ago she noted a wound on her left lateral heel. She zinc oxide and some silver alginate she had left over until she could be seen here. It was also noted that she has had blistering on her posterior left calf and an open area here as well. Her records note that she has chronic venous insufficiency with  chronic inflammation especially in the left mid calf area. The patient has not been systemically unwell no fever or chills. She is reasonably insensate and has not felt any pain 09/04/16; the patient has a fairly substantial wound on the left lateral calcaneus. We have been using Santyl to this area. Blister on the left lower leg appears to of healed the patient also follows with Dr. Sherren Mocha early of vein and vascular. He felt that she should have enough blood flow to heal her left heel wound. He renewed her doxycycline for a further week. She is status post left femoral to anterior tibial bypass in February 2016. 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 She has a remote history of a right BKA. The XRAY that I ordered last week showed no definite evidence of osteomyelitis however a small focus of demineralization was noted in the plantar aspect of the calcaneus which might warrant an MRI possibly indicating early osteomyelitis however this is not in the area of her wound and I'm going to keep this under advisement for now. 09/11/16; still a substantial wound in the left lateral calcaneus we've been using Santyl Wahlquist, Manasvi B. ([id number]) 09/18/16; still a substantial wound on the left lateral  calcaneus. We are still using Santyl 09/24/16; still a substantial wound on the left lateral calcaneus. We're using Santyl change one or 2 times a week and mechanical debridement which he comes to [place]. 10/02/16; left lateral calcaneus using Santyl changing 3 times a week. Run Apligraf through her insurance 10/09/16; still using Santyl without much change. Being changed 2 times a week by the patient and once here. We did not run Apligraf last week we'll do that this week 10/16/16; the area on her left lateral heel; started Iodoflex last week. Apligraf set a co-pay of $063 per for application 0/16/01; left lateral heel. on iodoflex. approved for appligraft, start next week Objective Constitutional Sitting or standing Blood Pressure is within target range for patient.. Pulse regular and within target range for patient.Marland Kitchen Respirations regular, non-labored and within target range.. Patient's appearance is neat and clean. Appears in no acute distress. Well nourished and well developed.. Vitals Time Taken: 3:24 PM, Height: 63 in, Weight: 205 lbs, BMI: 36.3, Temperature: 97.9 F, Pulse: 63 bpm, Respiratory Rate: 18 breaths/min, Blood Pressure: 131/44 mmHg. General Notes: Wound exam; lateral plantar heel .easy debridement of necrotic surface suing a #3 curret. healthy granulated surface. no evidence of surrounding infection Integumentary (Hair, Skin) Wound #2 status is Open. Original cause of wound was Gradually Appeared. The wound is located on the Left,Lateral Calcaneus. The wound measures 2.4cm length x 2.5cm width x 0.2cm depth; 4.712cm^2 area and 0.942cm^3 volume. The wound is limited to skin breakdown. There is no tunneling or undermining noted. There is a large amount of serous drainage noted. The wound margin is flat and intact. There is medium (34-66%) red granulation within the wound bed. There is a medium (34-66%) amount of necrotic tissue within the wound bed including Eschar and Adherent  Slough. The periwound skin appearance exhibited: Maceration. The periwound skin appearance did not exhibit: Callus, Crepitus, Excoriation, Induration, Rash, Scarring, Dry/Scaly, Atrophie Blanche, Cyanosis, Ecchymosis, Hemosiderin Staining, Mottled, Pallor, Rubor, Erythema. Periwound temperature was noted as No Abnormality. The periwound has tenderness on palpation. 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 There was a Skin/Subcutaneous Tissue Debridement ([id number]) debridement with total area of 6 sq cm performed by Ricard Dillon, MD. with the following instrument(s): Curette to remove Viable and Non-Viable tissue/material including Fibrin/Slough and Subcutaneous after achieving pain control using Lidocaine 4% Topical Solution. A time out was conducted at 15:45, prior to the start of the procedure. A Minimum amount of bleeding was controlled with Pressure. The procedure was tolerated well with a pain level of 0 throughout and a pain level of 0 following the procedure. Post Debridement Measurements: 2.4cm length x 2.5cm width x 0.2cm depth; 0.942cm^3 volume. Character of Wound/Ulcer Post Debridement is improved. Severity of Tissue Post Debridement is: Fat layer exposed. Post procedure Diagnosis Wound #2: Same as Pre-Procedure Plan Wound  Cleansing: Wound #2 Left,Lateral Calcaneus: Clean wound with Normal Saline. Anesthetic: Wound #2 Left,Lateral Calcaneus: Topical Lidocaine 4% cream applied to wound bed prior to debridement Primary Wound Dressing: Wound #2 Left,Lateral Calcaneus: Iodoflex Secondary Dressing: Wound #2 Left,Lateral Calcaneus: ABD pad Dry Gauze Dressing Change Frequency: Wound #2 Left,Lateral Calcaneus: Smick, Ernie B. ([id number]) Other: - twice weekly - home nurse to change wrap when she visits (may change more than twice weekly if nurse visits more often) Follow-up Appointments: Wound #2 Left,Lateral Calcaneus: Return Appointment in 1 week. Edema Control: Wound #2 Left,Lateral Calcaneus: Kerlix and Coban - Left Lower Extremity Off-Loading: Wound #2 Left,Lateral Calcaneus: Other: - open heel shoe Additional Orders / Instructions: Wound #2 Left,Lateral Calcaneus: Increase protein intake. iodoflex for one more week appilgraf next week Electronic Signature(s) Signed: 10/23/2016 4:24:49 PM By: Linton Ham MD Entered By: Linton Ham on 10/23/2016 15:57:51 [name], [name] ([id number]) -------------------------------------------------------------------------------- SuperBill Details Patient Name: [name] Date of Service: 10/23/2016 Medical Record Patient Account Number: [id number] Number: Treating RN: Montey Hora 1941/03/13 (76 y.o. Other Clinician: Date of Birth/Sex: Female) Treating ROBSON, MICHAEL Primary Care Provider: Leighton Ruff Provider/Extender: G Referring Provider: Leighton Ruff Service Line: Outpatient Weeks in Treatment: 9 Diagnosis Coding ICD-10 Codes Code Description E11.621 Type 2 diabetes mellitus with foot ulcer E11.51 Type 2 diabetes mellitus with diabetic peripheral angiopathy without gangrene E11.40 Type 2 diabetes mellitus with diabetic neuropathy, unspecified L97.423 Non-pressure chronic ulcer of left heel and midfoot  with necrosis of muscle I87.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 L97.221 Non-pressure chronic ulcer of left calf limited to breakdown of skin L03.116 Cellulitis of left lower limb Facility Procedures CPT4: Description Modifier Quantity Code [id number] 11042 - DEB SUBQ TISSUE 20 SQ CM/< 1 ICD-10 Description Diagnosis E11.621 Type 2 diabetes mellitus with foot ulcer I87.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 hypertension (idiopathic) with inflammation of left lower  extremity Physician Procedures CPT4: Description Modifier Quantity Code [id number] 01779 - WC PHYS SUBQ TISS 20 SQ CM 1 ICD-10 Description Diagnosis E11.621 Type 2 diabetes mellitus with foot ulcer I87.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 Electronic Signature(s) NALDA, [place] ([id number]) Signed: 10/23/2016 4:24:49 PM By: Linton Ham MD Entered By: Linton Ham on 10/23/2016 15:58:17

## 2016-10-30 ENCOUNTER — Encounter: Payer: PPO | Admitting: Internal Medicine

## 2016-10-30 ENCOUNTER — Other Ambulatory Visit: Payer: Self-pay | Admitting: Cardiology

## 2016-10-30 DIAGNOSIS — L97421 Non-pressure chronic ulcer of left heel and midfoot limited to breakdown of skin: Secondary | ICD-10-CM | POA: Diagnosis not present

## 2016-10-30 DIAGNOSIS — E11621 Type 2 diabetes mellitus with foot ulcer: Secondary | ICD-10-CM | POA: Diagnosis not present

## 2016-10-31 NOTE — Progress Notes (Signed)
ELLORIE, KINDALL ([id number]) Visit Report for 10/30/2016 Arrival Information Details Patient Name: [name], [name] Date of Service: 10/30/2016 3:30 PM Medical Record Number: [id number] Patient Account Number: [id number] Date of Birth/Sex: [birth year] (76 y.o. Female) Treating RN: Montey Hora Primary Care Keontae Levingston: Leighton Ruff Other Clinician: Referring Ogle Hoeffner: Leighton Ruff Treating Landa Mullinax/Extender: Tito Dine in Treatment: 10 Visit Information History Since Last Visit Added or deleted any medications: No Patient Arrived: Wheel Chair Any new allergies or adverse reactions: No Arrival Time: 15:39 Had a fall or experienced change in No Accompanied By: friend activities of daily living that may affect Transfer Assistance: None risk of falls: Patient Identification Verified: Yes Signs or symptoms of abuse/neglect since last No Secondary Verification Process Yes visito Completed: Hospitalized since last visit: No Patient Has Alerts: Yes Has Dressing in Place as Prescribed: Yes Patient Alerts: Patient on Blood Has Compression in Place as Prescribed: Yes Thinner Pain Present Now: No eliquis DMII ABI [place] >220 Electronic Signature(s) Signed: 10/30/2016 4:58:21 PM By: Montey Hora Entered By: Montey Hora on 10/30/2016 15:40:12 Niess, [name] ([id number]) -------------------------------------------------------------------------------- Encounter Discharge Information Details Patient Name: [name] Date of Service: 10/30/2016 3:30 PM Medical Record Number: [id number] Patient Account Number: [id number] Date of Birth/Sex: April 03, 1941 (76 y.o. Female) Treating RN: Montey Hora Primary Care Annjanette Wertenberger: Leighton Ruff Other Clinician: Referring Zoraida Havrilla: Leighton Ruff Treating Kaytlin Burklow/Extender: Tito Dine in Treatment: 10 Encounter Discharge Information Items Discharge Pain Level: 0 Discharge Condition:  Stable Ambulatory Status: Wheelchair Discharge Destination: Home Transportation: Private Auto Accompanied By: daughter Schedule Follow-up Appointment: Yes Medication Reconciliation completed and provided to Patient/Care No Dearion Huot: Provided on Clinical Summary of Care: 10/30/2016 Form Type Recipient Paper Patient BI Electronic Signature(s) Signed: 10/30/2016 4:25:39 PM By: Ruthine Dose Entered By: Ruthine Dose on 10/30/2016 16:25:39 Capurro, [name] ([id number]) -------------------------------------------------------------------------------- Lower Extremity Assessment Details Patient Name: [name] Date of Service: 10/30/2016 3:30 PM Medical Record Number: [id number] Patient Account Number: [id number] Date of Birth/Sex: [birth year] (76 y.o. Female) Treating RN: Montey Hora Primary Care Khiyan Crace: Leighton Ruff Other Clinician: Referring Rhonda Vangieson: Leighton Ruff Treating Asah Lamay/Extender: Tito Dine in Treatment: 10 Vascular Assessment Pulses: Dorsalis Pedis Palpable: [Left:Yes] Posterior Tibial Extremity colors, hair growth, and conditions: Extremity Color: [Left:Hyperpigmented] Hair Growth on Extremity: [Left:No] Temperature of Extremity: [Left:Warm] Capillary Refill: [Left:< 3 seconds] Electronic Signature(s) Signed: 10/30/2016 4:58:21 PM By: Montey Hora Entered By: Montey Hora on 10/30/2016 15:48:15 Dann, [name] ([id number]) -------------------------------------------------------------------------------- Multi Wound Chart Details Patient Name: [name] Date of Service: 10/30/2016 3:30 PM Medical Record Number: [id number] Patient Account Number: [id number] Date of Birth/Sex: Sep 16, 1940 (76 y.o. 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] Comorbid History: [2:Anemia, Coronary Artery N/A Disease, Hypertension, Myocardial Infarction, Type II Diabetes, Osteoarthritis, Neuropathy] Date Acquired: [2:08/12/2016] [N/A:N/A] Weeks of Treatment: [2:10] [N/A:N/A] Wound Status: [2:Open] [N/A:N/A] Measurements L x W x D 2.4x2.5x0.2 [N/A:N/A] (cm) Area (cm) : [2:4.712] [N/A:N/A] Volume (cm) : [2:0.942] [N/A:N/A] % Reduction in Area: [2:35.10%] [N/A:N/A] % Reduction in Volume: 35.10% [N/A:N/A] Classification: [2:Grade 1] [N/A:N/A] Exudate Amount: [2:Large] [N/A:N/A] Exudate Type: [2:Serous] [N/A:N/A] Exudate Color: [2:amber] [N/A:N/A] Foul Odor After [  2:Yes] [N/A:N/A] Cleansing: Odor Anticipated Due to No [N/A:N/A] Product Use: Wound Margin: [2:Flat and Intact] [N/A:N/A] Granulation Amount: [2:Medium (34-66%)] [N/A:N/A] Granulation Quality: [2:Red] [N/A:N/A] Necrotic Amount: Medium (34-66%) N/A N/A Necrotic Tissue: Eschar, Adherent Slough N/A N/A Exposed Structures: Fascia: No N/A N/A Fat Layer (Subcutaneous Tissue) Exposed: No Tendon: No Muscle: No Joint: No Bone: No Limited to Skin Breakdown Epithelialization: None N/A N/A Periwound Skin Texture: Excoriation: No N/A N/A Induration: No Callus: No Crepitus: No Rash: No Scarring: No Periwound Skin Maceration: Yes N/A N/A Moisture: Dry/Scaly: No Periwound Skin Color: Atrophie Blanche: No N/A N/A Cyanosis: No Ecchymosis: No Erythema: No Hemosiderin Staining: No Mottled: No Pallor: No Rubor: No Temperature: No Abnormality N/A N/A Tenderness on Yes N/A N/A Palpation: Wound  Preparation: Ulcer Cleansing: N/A N/A Rinsed/Irrigated with Saline Topical Anesthetic Applied: Other: lidocaine 4% Procedures Performed: Cellular or Tissue Based N/A N/A Product Treatment Notes Wound #2 (Left, Lateral Calcaneus) 1. Cleansed with: Clean wound with Normal Saline Cleanse wound with antibacterial soap and water 2. Anesthetic Topical Lidocaine 4% cream to wound bed prior to debridement 3. Peri-wound Care: DYLIN, IHNEN ([id number]) Skin Prep 4. Dressing Applied: Other dressing (specify in notes) 5. Secondary Dressing Applied ABD Pad Dry Gauze 7. Secured with Tape Notes kerlix and coban wrap, charcoal, mepitel, steri-strips, Apligraph Electronic Signature(s) Signed: 10/30/2016 4:37:19 PM By: Linton Ham MD Entered By: Linton Ham on 10/30/2016 16:28:01 Stallone, [name] ([id number]) -------------------------------------------------------------------------------- Multi-Disciplinary Care Plan Details Patient Name: [name] Date of Service: 10/30/2016 3:30 PM Medical Record Number: [id number] Patient Account Number: [id number] Date of Birth/Sex: Jun 01, 1941 (76 y.o. Female) Treating RN: Montey Hora Primary Care Kinzly Pierrelouis: Leighton Ruff Other Clinician: Referring Bret Vanessen: Leighton Ruff Treating Angeles Zehner/Extender: Tito Dine in Treatment: 10 Active Inactive ` Abuse / Safety / Falls / Self Care Management Nursing Diagnoses: Potential for falls Goals: Patient will remain injury free Date Initiated: 08/21/2016 Target Resolution Date: 10/08/2016 Goal Status: Active Interventions: Assess fall risk on admission and as needed Notes: ` Necrotic Tissue Nursing Diagnoses: Impaired tissue integrity related to necrotic/devitalized tissue Goals: Necrotic/devitalized tissue will be minimized in the wound bed Date Initiated: 08/21/2016 Target Resolution Date: 10/08/2016 Goal Status: Active Interventions: Assess patient pain  level pre-, during and post procedure and prior to discharge Notes: ` Orientation to the Wound Care Program Nursing Diagnoses: Knowledge deficit related to the wound healing center program KEARY, HANAK ([id number]) Goals: Patient/caregiver will verbalize understanding of the [facility] Date Initiated: 08/21/2016 Target Resolution Date: 10/08/2016 Goal Status: Active Interventions: Provide education on orientation to the wound center Notes: ` Peripheral Neuropathy Nursing Diagnoses: Potential alteration in peripheral tissue perfusion (select prior to confirmation of diagnosis) Goals: Patient/caregiver will verbalize understanding of disease process and disease management Date Initiated: 08/21/2016 Target Resolution Date: 10/08/2016 Goal Status: Active Interventions: Assess signs and symptoms of neuropathy upon admission and as needed Notes: ` Venous Leg Ulcer Nursing Diagnoses: Actual venous Insuffiency (use after diagnosis is confirmed) Goals: Patient will maintain optimal edema control Date Initiated: 08/21/2016 Target Resolution Date: 10/08/2016 Goal Status: Active Interventions: Assess peripheral edema status every visit. Notes: ` Wound/Skin Impairment Nursing Diagnoses: Impaired tissue integrity Dicenzo, Gazelle B. ([id number]) Goals: Patient/caregiver will verbalize understanding of skin care regimen Date Initiated: 08/21/2016 Target Resolution Date: 10/08/2016 Goal Status: Active Ulcer/skin breakdown will have a volume reduction of 30% by week 4 Date Initiated: 08/21/2016 Target Resolution Date: 10/08/2016 Goal Status: Active Ulcer/skin breakdown will have a volume reduction of 50% by week 8 Date  Initiated: 08/21/2016 Target Resolution Date: 10/29/2016 Goal Status: Active Ulcer/skin breakdown will have a volume reduction of 80% by week 12 Date Initiated: 08/21/2016 Target Resolution Date: 11/12/2016 Goal Status: Active Ulcer/skin breakdown will  heal within 14 weeks Date Initiated: 08/21/2016 Target Resolution Date: 11/12/2016 Goal Status: Active Interventions: Assess patient/caregiver ability to obtain necessary supplies Assess patient/caregiver ability to perform ulcer/skin care regimen upon admission and as needed Assess ulceration(s) every visit Notes: Electronic Signature(s) Signed: 10/30/2016 4:58:21 PM By: Montey Hora Entered By: Montey Hora on 10/30/2016 16:07:22 Sidman, [name] ([id number]) -------------------------------------------------------------------------------- Pain Assessment Details Patient Name: [name] Date of Service: 10/30/2016 3:30 PM Medical Record Number: [id number] Patient Account Number: [id number] Date of Birth/Sex: 1941-07-25 (76 y.o. Female) Treating RN: Montey Hora Primary Care Cassey Hurrell: Leighton Ruff Other Clinician: Referring Raylyn Speckman: Leighton Ruff Treating Glenda Kunst/Extender: Tito Dine in Treatment: 10 Active Problems Location of Pain Severity and Description of Pain Patient Has Paino No Site Locations Pain Management and Medication Current Pain Management: Notes Topical or injectable lidocaine is offered to patient for acute pain when surgical debridement is performed. If needed, Patient is instructed to use over the counter pain medication for the following 24-48 hours after debridement. Wound care MDs do not prescribed pain medications. Patient has chronic pain or uncontrolled pain. Patient has been instructed to make an appointment with their Primary Care Physician for pain management. Electronic Signature(s) Signed: 10/30/2016 4:58:21 PM By: Montey Hora Entered By: Montey Hora on 10/30/2016 15:43:11 Villafuerte, [name] ([id number]) -------------------------------------------------------------------------------- Patient/Caregiver Education Details Patient Name: [name] Date of Service: 10/30/2016 3:30 PM Medical Record  Patient Account Number: [id number] Number: Treating RN: Montey Hora Oct 13, 1940 (76 y.o. Other Clinician: Date of Birth/Gender: Female) Treating ROBSON, MICHAEL Primary Care Physician: Leighton Ruff Physician/Extender: G Referring Physician: Bethanie Dicker in Treatment: 10 Education Assessment Education Provided To: Patient Education Topics Provided Wound/Skin Impairment: Handouts: Other: do not get wrap wet Methods: Demonstration, Explain/Verbal Responses: State content correctly Electronic Signature(s) Signed: 10/30/2016 4:58:21 PM By: Montey Hora Entered By: Montey Hora on 10/30/2016 16:24:03 Allensworth, [name] ([id number]) -------------------------------------------------------------------------------- Wound Assessment Details Patient Name: [name] Date of Service: 10/30/2016 3:30 PM Medical Record Number: [id number] Patient Account Number: [id number] Date of Birth/Sex: [birth year] (76 y.o. Female) Treating RN: Montey Hora Primary Care Aria Pickrell: Leighton Ruff Other Clinician: Referring Trysta Showman: Leighton Ruff Treating Capricia Serda/Extender: Tito Dine in Treatment: 10 Wound Status Wound Number: 2 Primary Diabetic Wound/Ulcer of the Lower Etiology: Extremity Wound Location: Left Calcaneus - Lateral Wound Open Wounding Event: Gradually Appeared Status: Date Acquired: 08/12/2016 Comorbid Anemia, Coronary Artery Disease, Weeks Of Treatment: 10 History: Hypertension, Myocardial Infarction, Clustered Wound: No Type II Diabetes, Osteoarthritis, Neuropathy Photos Photo Uploaded By: Montey Hora on 10/30/2016 16:50:39 Wound Measurements Length: (cm) 2.4 Width: (cm) 2.5 Depth: (cm) 0.2 Area: (cm) 4.712 Volume: (cm) 0.942 % Reduction in Area: 35.1% % Reduction in Volume: 35.1% Epithelialization: None Tunneling: No Undermining: No Wound Description Classification: Grade 1 Foul Odor Afte Wound Margin: Flat  and Intact Due to Product Exudate Amount: Large Slough/Fibrino Exudate Type: Serous Exudate Color: amber r Cleansing: Yes Use: No Yes Wound Bed Granulation Amount: Medium (34-66%) Exposed Structure Granulation Quality: Red Fascia Exposed: No Necrotic Amount: Medium (34-66%) Fat Layer (Subcutaneous Tissue) Exposed: No Tigges, Cadynce B. ([id number]) Necrotic Quality: Eschar, Adherent Slough Tendon Exposed: No Muscle Exposed: No Joint Exposed: No Bone Exposed: No Limited to Skin Breakdown Periwound Skin Texture Texture Color No Abnormalities Noted: No No Abnormalities Noted: No  Callus: No Atrophie Blanche: No Crepitus: No Cyanosis: No Excoriation: No Ecchymosis: No Induration: No Erythema: No Rash: No Hemosiderin Staining: No Scarring: No Mottled: No Pallor: No Moisture Rubor: No No Abnormalities Noted: No Dry / Scaly: No Temperature / Pain Maceration: Yes Temperature: No Abnormality Tenderness on Palpation: Yes Wound Preparation Ulcer Cleansing: Rinsed/Irrigated with Saline Topical Anesthetic Applied: Other: lidocaine 4%, Treatment Notes Wound #2 (Left, Lateral Calcaneus) 1. Cleansed with: Clean wound with Normal Saline Cleanse wound with antibacterial soap and water 2. Anesthetic Topical Lidocaine 4% cream to wound bed prior to debridement 3. Peri-wound Care: Skin Prep 4. Dressing Applied: Other dressing (specify in notes) 5. Secondary Dressing Applied ABD Pad Dry Gauze 7. Secured with Tape Notes kerlix and coban wrap, charcoal, mepitel, steri-strips, Apligraph Electronic Signature(s) Signed: 10/30/2016 4:58:21 PM By: [name], [name] ([id number]) Entered By: Montey Hora on 10/30/2016 15:52:22 Ribera, [name] ([id number]) -------------------------------------------------------------------------------- Vitals Details Patient Name: [name] Date of Service: 10/30/2016 3:30 PM Medical Record Number: [id number] Patient  Account Number: [id number] Date of Birth/Sex: 09-Jul-1941 (76 y.o. Female) Treating RN: Montey Hora Primary Care Adriana Quinby: Leighton Ruff Other Clinician: Referring Alasia Enge: Leighton Ruff Treating Jachin Coury/Extender: Tito Dine in Treatment: 10 Vital Signs Time Taken: 15:43 Temperature (F): 97.7 Height (in): 63 Pulse (bpm): 79 Weight (lbs): 205 Respiratory Rate (breaths/min): 18 Body Mass Index (BMI): 36.3 Blood Pressure (mmHg): 152/55 Reference Range: 80 - 120 mg / dl Electronic Signature(s) Signed: 10/30/2016 4:58:21 PM By: Montey Hora Entered By: Montey Hora on 10/30/2016 15:43:30

## 2016-10-31 NOTE — Progress Notes (Signed)
JOVI, ALVIZO ([id number]) Visit Report for 10/30/2016 Chief Complaint Document Details Patient Name: [name], [name] Date of Service: 10/30/2016 3:30 PM Medical Record Patient Account Number: [id number] Number: Treating RN: Montey Hora 22-Jun-1941 (76 y.o. Other Clinician: Date of Birth/Sex: Female) Treating Meadow Abramo Primary Care Provider: Leighton Ruff Provider/Extender: G Referring Provider: Bethanie Dicker in Treatment: 10 Information Obtained from: Patient Chief Complaint Patient is here for review of a wound on the left heel Electronic Signature(s) Signed: 10/30/2016 4:37:19 PM By: Linton Ham MD Entered By: Linton Ham on 10/30/2016 16:28:07 [name] ([id number]) -------------------------------------------------------------------------------- Cellular or Tissue Based Product Details Patient Name: [name] Date of Service: 10/30/2016 3:30 PM Medical Record Patient Account Number: [id number] Number: Treating RN: Montey Hora February 21, 1941 (76 y.o. Other Clinician: Date of Birth/Sex: Female) Treating Pius Byrom, [place] Primary Care Provider: Leighton Ruff Provider/Extender: G Referring Provider: Bethanie Dicker in Treatment: 10 Cellular or Tissue Based Wound #2 Left,Lateral Calcaneus Product Type Applied to: Performed By: Physician Ricard Dillon, MD Cellular or Tissue Based Apligraf Product Type: Pre-procedure Yes - 16:08 Verification/Time Out Taken: Location: genitalia / hands / feet / multiple digits Wound Size (sq cm): 6 Product Size (sq cm): 44 Waste Size (sq cm): 38 Waste Reason: wound size Amount of Product Applied (sq cm): 6 Lot #: [id number] Expiration Date: 11/05/2016 Fenestrated: Yes Instrument: Blade Reconstituted: Yes Solution Type: NORMAL SALINE Solution Amount: 3ML Lot #: [id number] Solution Expiration 07/12/2018 Date: Secured: Yes Secured With:  Steri-Strips Dressing Applied: Yes Primary Dressing: MEPITEL Procedural Pain: 0 Post Procedural Pain: 0 Response to Treatment: Procedure was tolerated well Post Procedure Diagnosis Same as Pre-procedure Electronic Signature(s) [name], [name] ([id number]) Signed: 10/30/2016 4:58:21 PM By: Montey Hora Entered By: Montey Hora on 10/30/2016 16:15:04 [name], [name] ([id number]) -------------------------------------------------------------------------------- HPI Details Patient Name: [name] Date of Service: 10/30/2016 3:30 PM Medical Record Patient Account Number: [id number] Number: Treating RN: Montey Hora 31-Jul-1941 (76 y.o. Other Clinician: Date of Birth/Sex: Female) Treating Chistian Kasler Primary Care Provider: Leighton Ruff Provider/Extender: G Referring Provider: Bethanie Dicker in Treatment: 10 History of Present Illness HPI Description: 08/21/16; Mrs. Karstens is a 76 year old woman who I cared for in our [facility] clinic up until January 2016. At that point she had a wound in her left great toe. 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 He follows with vein and vascular and has a follow-up appointment in early February. She is status left femoral to anterior tibial bypass in February 2016. She is also noted to have venous stasis changes and a history of ulcers. 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 fib on Eliquis. She has bladder cancer and has a chronic urostomy. The patient tells me that 2 weeks ago she had a right prosthesis and a new left diabetic shoe. Roughly one week ago she noted a wound on her left lateral heel. She zinc  oxide and some silver alginate she had left over until she could be seen here. It was also noted that she has had blistering on her posterior left calf and an open area here as well. Her records note that she has chronic venous insufficiency with  chronic inflammation especially in the left mid calf area. The patient has not been systemically unwell no fever or chills. She is reasonably insensate and has not felt any pain 09/04/16; the patient has a fairly substantial wound on the left lateral calcaneus. We have been using Santyl to this area. Blister on the left lower leg appears to of healed the patient also follows with Dr. Sherren Mocha early of vein and vascular. He felt that she should have enough blood flow to heal her left heel wound. He renewed her doxycycline for a further week. She is status post left femoral to anterior tibial bypass in February 2016. 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 She has a remote history of a right BKA. The XRAY that I ordered last week showed no definite evidence of osteomyelitis however a small focus of demineralization was noted in the plantar aspect of the calcaneus which might warrant an MRI possibly indicating early osteomyelitis however this is not in the area of her wound and I'm going to keep this under advisement for now. 09/11/16; still a substantial wound in the left lateral calcaneus we've been using Santyl 09/18/16; still a substantial wound on the left lateral calcaneus. We are still using Santyl 09/24/16; still a substantial wound on the left lateral calcaneus. We're using Santyl change one or 2 times a week and mechanical debridement which he comes to [place]. 10/02/16; left lateral calcaneus using Santyl changing 3 times a week. Run Apligraf through her insurance 10/09/16; still using Santyl without much change. Being changed 2 times a week by the patient and once [name], [name] B. ([id number]) here. We did not run Apligraf last week we'll do that  this week 10/16/16; the area on her left lateral heel; started Iodoflex last week. Apligraf set a co-pay of $409 per for application 03/22/90; left lateral heel. on iodoflex. approved for appligraft, start next week 10/30/16 Apligraf #1 today Electronic Signature(s) Signed: 10/30/2016 4:37:19 PM By: Linton Ham MD Entered By: Linton Ham on 10/30/2016 16:28:26 [name], [name] ([id number]) -------------------------------------------------------------------------------- Physical Exam Details Patient Name: [name] Date of Service: 10/30/2016 3:30 PM Medical Record Patient Account Number: [id number] Number: Treating RN: Montey Hora [birth year] (76 y.o. Other Clinician: Date of Birth/Sex: Female) Treating Virgil Slinger Primary Care Provider: Leighton Ruff Provider/Extender: G Referring Provider: Bethanie Dicker in Treatment: 10 Constitutional Patient is hypertensive.. Pulse regular and within target range for patient.Marland Kitchen Respirations regular, non-labored and within target range.. Temperature is normal and within the target range for the patient.. Patient's appearance is neat and clean. Appears in no acute distress. Well nourished and well developed.. Notes With exam; lateral plantar heel on the left. Wound was cleansed using gauze. Healthy granulated surface. Apligraf #1. Electronic Signature(s) Signed: 10/30/2016 4:37:19 PM By: Linton Ham MD Entered By: Linton Ham on 10/30/2016 16:29:10 [name], [name] ([id number]) -------------------------------------------------------------------------------- Physician Orders Details Patient Name: [name] Date of Service: 10/30/2016 3:30 PM Medical Record Patient Account Number: [id number] Number: Treating RN: Montey Hora Mar 02, 1941 (76 y.o. Other Clinician: Date of Birth/Sex: Female) Treating Ricki Clack Primary Care Provider: Leighton Ruff Provider/Extender:  G Referring Provider: Bethanie Dicker in Treatment: 10 Verbal / Phone Orders: Yes Clinician: Montey Hora Read Back and Verified: Yes Diagnosis Coding Wound Cleansing Wound #2 Left,Lateral Calcaneus o Clean wound with Normal Saline. Anesthetic Wound #2 Left,Lateral Calcaneus o Topical Lidocaine 4% cream applied to wound bed prior to debridement Skin Barriers/Peri-Wound Care Wound #2 Left,Lateral Calcaneus o Skin Prep Primary  Wound Dressing Wound #2 Left,Lateral Calcaneus o Other: - Apligraph Secondary Dressing Wound #2 Left,Lateral Calcaneus o ABD pad o Dry Gauze o Other - steri-strips, mepitel, charcoal Dressing Change Frequency Wound #2 Left,Lateral Calcaneus o Change dressing every week Follow-up Appointments Wound #2 Left,Lateral Calcaneus o Return Appointment in 1 week. o Nurse Visit as needed - next week Edema Control Wound #2 Left,Lateral Calcaneus [name], [name] B. ([id number]) o Kerlix and Coban - Left Lower Extremity - unna to anchor Off-Loading Wound #2 Left,Lateral Calcaneus o Other: - open heel shoe Additional Orders / Instructions Wound #2 Left,Lateral Calcaneus o Increase protein intake. Electronic Signature(s) Signed: 10/30/2016 4:37:19 PM By: Linton Ham MD Signed: 10/30/2016 4:58:21 PM By: Montey Hora Entered By: Montey Hora on 10/30/2016 16:21:17 [name], [name] ([id number]) -------------------------------------------------------------------------------- Problem List Details Patient Name: [name] Date of Service: 10/30/2016 3:30 PM Medical Record Patient Account Number: [id number] Number: Treating RN: Montey Hora [birth year] (76 y.o. Other Clinician: Date of Birth/Sex: Female) Treating Jadda Hunsucker Primary Care Provider: Leighton Ruff Provider/Extender: G Referring Provider: Bethanie Dicker in Treatment: 10 Active Problems ICD-10 Encounter Code Description  Active Date Diagnosis E11.621 Type 2 diabetes mellitus with foot ulcer 08/21/2016 Yes E11.51 Type 2 diabetes mellitus with diabetic peripheral 08/21/2016 Yes angiopathy without gangrene E11.40 Type 2 diabetes mellitus with diabetic neuropathy, 08/21/2016 Yes unspecified L97.423 Non-pressure chronic ulcer of left heel and midfoot with 08/21/2016 Yes necrosis of muscle I87.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 hypertension (idiopathic) with 08/21/2016 Yes inflammation of left lower extremity L97.221 Non-pressure chronic ulcer of left calf limited to 08/21/2016 Yes breakdown of skin L03.116 Cellulitis of left lower limb 08/21/2016 Yes Inactive Problems Resolved Problems Electronic Signature(s) [name], [name] ([id number]) Signed: 10/30/2016 4:37:19 PM By: Linton Ham MD Entered By: Linton Ham on 10/30/2016 16:27:57 [name], [name] ([id number]) -------------------------------------------------------------------------------- Progress Note Details Patient Name: [name] Date of Service: 10/30/2016 3:30 PM Medical Record Patient Account Number: [id number] Number: Treating RN: Montey Hora 30-Aug-1940 (76 y.o. Other Clinician: Date of Birth/Sex: Female) Treating Punam Broussard Primary Care Provider: Leighton Ruff Provider/Extender: G Referring Provider: Bethanie Dicker in Treatment: 10 Subjective Chief Complaint Information obtained from Patient Patient is here for review of a wound on the left heel History of Present Illness (HPI) 08/21/16; Mrs. Enloe is a 76 year old woman who I cared for in our [facility] clinic up until January 2016. At that point she had a wound in her left great toe. 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 He follows with vein and vascular and has a follow-up appointment in  early February. She is status left femoral to anterior tibial bypass in February 2016. She is also noted to have venous stasis changes and a history of ulcers. 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 fib on Eliquis. She has bladder cancer and has a chronic urostomy. The patient tells me that 2 weeks ago she had a right prosthesis and a new left diabetic shoe. Roughly one week ago she noted a wound on her left lateral heel. She zinc oxide and some silver alginate she had left over until she could be seen here. It was also noted that she has had blistering on  her posterior left calf and an open area here as well. Her records note that she has chronic venous insufficiency with chronic inflammation especially in the left mid calf area. The patient has not been systemically unwell no fever or chills. She is reasonably insensate and has not felt any pain 09/04/16; the patient has a fairly substantial wound on the left lateral calcaneus. We have been using Santyl to this area. Blister on the left lower leg appears to of healed the patient also follows with Dr. Sherren Mocha early of vein and vascular. He felt that she should have enough blood flow to heal her left heel wound. He renewed her doxycycline for a further week. She is status post left femoral to anterior tibial bypass in February 2016. 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 She has a remote history of a right BKA. The XRAY that I ordered last week showed no definite evidence of osteomyelitis however a small focus of demineralization was noted in the plantar aspect of the calcaneus which might warrant an MRI possibly indicating early osteomyelitis however this is not in the area of her wound and I'm going to keep this under advisement for now. 09/11/16; still a substantial wound in the left lateral calcaneus  we've been using Santyl [name], [name] B. ([id number]) 09/18/16; still a substantial wound on the left lateral calcaneus. We are still using Santyl 09/24/16; still a substantial wound on the left lateral calcaneus. We're using Santyl change one or 2 times a week and mechanical debridement which he comes to [place]. 10/02/16; left lateral calcaneus using Santyl changing 3 times a week. Run Apligraf through her insurance 10/09/16; still using Santyl without much change. Being changed 2 times a week by the patient and once here. We did not run Apligraf last week we'll do that this week 10/16/16; the area on her left lateral heel; started Iodoflex last week. Apligraf set a co-pay of $778 per for application 2/42/35; left lateral heel. on iodoflex. approved for appligraft, start next week 10/30/16 Apligraf #1 today Objective Constitutional Patient is hypertensive.. Pulse regular and within target range for patient.Marland Kitchen Respirations regular, non-labored and within target range.. Temperature is normal and within the target range for the patient.. Patient's appearance is neat and clean. Appears in no acute distress. Well nourished and well developed.. Vitals Time Taken: 3:43 PM, Height: 63 in, Weight: 205 lbs, BMI: 36.3, Temperature: 97.7 F, Pulse: 79 bpm, Respiratory Rate: 18 breaths/min, Blood Pressure: 152/55 mmHg. General Notes: With exam; lateral plantar heel on the left. Wound was cleansed using gauze. Healthy granulated surface. Apligraf #1. Integumentary (Hair, Skin) Wound #2 status is Open. Original cause of wound was Gradually Appeared. The wound is located on the Left,Lateral Calcaneus. The wound measures 2.4cm length x 2.5cm width x 0.2cm depth; 4.712cm^2 area and 0.942cm^3 volume. The wound is limited to skin breakdown. There is no tunneling or undermining noted. There is a large amount of serous drainage noted. The wound margin is flat and intact. There is medium (34-66%) red granulation within the  wound bed. There is a medium (34-66%) amount of necrotic tissue within the wound bed including Eschar and Adherent Slough. The periwound skin appearance exhibited: Maceration. The periwound skin appearance did not exhibit: Callus, Crepitus, Excoriation, Induration, Rash, Scarring, Dry/Scaly, Atrophie Blanche, Cyanosis, Ecchymosis, Hemosiderin Staining, Mottled, Pallor, Rubor, Erythema. Periwound temperature was noted as No Abnormality. The periwound has tenderness on palpation. Assessment Active Problems [name], [name] B. 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 A skin graft procedure using a bioengineered skin substitute/cellular or tissue based product was performed by Ricard Dillon, MD. Apligraf was applied and secured with Steri-Strips. 6 sq cm of product was utilized and 38 sq cm was wasted due to wound size. Post Application, MEPITEL was applied. A Time Out was conducted at 16:08, prior to the start of the procedure. The procedure was tolerated well with a pain level of 0 throughout and a pain level of 0 following the procedure. Post procedure Diagnosis Wound #2: Same as Pre-Procedure . Plan Wound Cleansing: Wound #2 Left,Lateral Calcaneus: Clean wound with Normal Saline. Anesthetic: Wound #2 Left,Lateral Calcaneus: Topical Lidocaine 4% cream applied to wound bed prior to  debridement Skin Barriers/Peri-Wound Care: Wound #2 Left,Lateral Calcaneus: Skin Prep Primary Wound Dressing: Wound #2 Left,Lateral Calcaneus: Other: - Apligraph Secondary Dressing: Wound #2 Left,Lateral Calcaneus: ABD pad Dry Gauze Other - steri-strips, mepitel, charcoal Dressing Change Frequency: Cizek, Letrice B. ([id number]) Wound #2 Left,Lateral Calcaneus: Change dressing every week Follow-up Appointments: Wound #2 Left,Lateral Calcaneus: Return Appointment in 1 week. Nurse Visit as needed - next week Edema Control: Wound #2 Left,Lateral Calcaneus: Kerlix and Coban - Left Lower Extremity - unna to anchor Off-Loading: Wound #2 Left,Lateral Calcaneus: Other: - open heel shoe Additional Orders / Instructions: Wound #2 Left,Lateral Calcaneus: Increase protein intake. #1 Apligraf #1 today, gauze, Mepitel, Steri-Strips Electronic Signature(s) Signed: 10/30/2016 4:37:19 PM By: Linton Ham MD Entered By: Linton Ham on 10/30/2016 16:30:17 Peden, [name] ([id number]) -------------------------------------------------------------------------------- SuperBill Details Patient Name: [name] Date of Service: 10/30/2016 Medical Record Patient Account Number: [id number] Number: Treating RN: Montey Hora [birth year] (76 y.o. 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 I87.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 hypertension (idiopathic) with inflammation of left lower  extremity L97.221 Non-pressure chronic ulcer of left calf limited to breakdown of skin L03.116 Cellulitis of left lower limb Facility Procedures CPT4 Code Description: [id number] (Facility Use Only) Apligraf 44 SQ CM Modifier: Quantity: 16 CPT4 Code Description: [id number] - SKIN SUB GRAFT FACE/NK/HF/G ICD-10 Description Diagnosis E11.621 Type 2 diabetes mellitus with foot ulcer L97.423 Non-pressure chronic ulcer of left heel and midfoot Modifier: with necrosis Quantity: 1 of muscle Physician Procedures CPT4 Code Description: [id number] - WC PHYS SKIN SUB GRAFT FACE/NK/HF/G ICD-10 Description Diagnosis E11.621 Type 2 diabetes mellitus with foot ulcer L97.423 Non-pressure chronic ulcer of left heel and midfoot wit Modifier: h necrosis Quantity: 1 of muscle Electronic Signature(s) Signed: 10/30/2016 4:37:19 PM By: Linton Ham MD [place], [name] ([id number]) Entered By: Linton Ham on 10/30/2016 16:30:47

## 2016-11-06 DIAGNOSIS — E11621 Type 2 diabetes mellitus with foot ulcer: Secondary | ICD-10-CM | POA: Diagnosis not present

## 2016-11-07 NOTE — Progress Notes (Addendum)
[name], [name] ([id number]) Visit Report for 11/06/2016 Arrival Information Details Patient Name: [name], [name] Date of Service: 11/06/2016 2:30 PM Medical Record Patient Account Number: [id number] Number: Treating RN: Montey Hora 03/01/1941 (76 y.o. Other Clinician: Date of Birth/Sex: Female) Treating ROBSON, MICHAEL Primary Care Gregg Winchell: Leighton Ruff Jhaden Pizzuto/Extender: G Referring Yuchen Fedor: Bethanie Dicker in Treatment: 11 Visit Information History Since Last Visit Added or deleted any medications: No Patient Arrived: Wheel Chair Any new allergies or adverse reactions: No Arrival Time: 14:41 Had a fall or experienced change in No Accompanied By: friend activities of daily living that may affect Transfer Assistance: Manual risk of falls: Patient Identification Verified: Yes Signs or symptoms of abuse/neglect since last No Secondary Verification Process Yes visito Completed: Hospitalized since last visit: No Patient Has Alerts: Yes Has Dressing in Place as Prescribed: Yes Patient Alerts: Patient on Blood Has Compression in Place as Prescribed: Yes Thinner Pain Present Now: No eliquis DMII ABI [place] >220 Electronic Signature(s) Signed: 11/06/2016 3:10:18 PM By: Montey Hora Entered By: Montey Hora on 11/06/2016 15:10:17 Gasparro, [name] ([id number]) -------------------------------------------------------------------------------- Clinic Level of Care Assessment Details Patient Name: [name] Date of Service: 11/06/2016 2:30 PM Medical Record Patient Account Number: [id number] Number: Treating RN: Montey Hora July 10, 1941 (76 y.o. Other Clinician: Date of Birth/Sex: Female) Treating ROBSON, [place] Primary Care Rhyder Bratz: Leighton Ruff Travian Kerner/Extender: G Referring Havish Petties: Bethanie Dicker in Treatment: 11 Clinic Level of Care Assessment Items TOOL 4 Quantity Score []  - Use when only an EandM is  performed on FOLLOW-UP visit 0 ASSESSMENTS - Nursing Assessment / Reassessment X - Reassessment of Co-morbidities (includes updates in patient status) 1 10 X - Reassessment of Adherence to Treatment Plan 1 5 ASSESSMENTS - Wound and Skin Assessment / Reassessment X - Simple Wound Assessment / Reassessment - one wound 1 5 []  - Complex Wound Assessment / Reassessment - multiple wounds 0 []  - Dermatologic / Skin Assessment (not related to wound area) 0 ASSESSMENTS - Focused Assessment []  - Circumferential Edema Measurements - multi extremities 0 []  - Nutritional Assessment / Counseling / Intervention 0 X - Lower Extremity Assessment (monofilament, tuning fork, pulses) 1 5 []  - Peripheral Arterial Disease Assessment (using hand held doppler) 0 ASSESSMENTS - Ostomy and/or Continence Assessment and Care []  - Incontinence Assessment and Management 0 []  - Ostomy Care Assessment and Management (repouching, etc.) 0 PROCESS - Coordination of Care X - Simple Patient / Family Education for ongoing care 1 15 []  - Complex (extensive) Patient / Family Education for ongoing care 0 []  - Staff obtains Programmer, systems, Records, Test Results / Process Orders 0 []  - Staff telephones HHA, Nursing Homes / Clarify orders / etc 0 Proctor, Donta B. ([id number]) []  - Routine Transfer to another Facility (non-emergent condition) 0 []  - Routine Hospital Admission (non-emergent condition) 0 []  - New Admissions / Biomedical engineer / Ordering NPWT, Apligraf, etc. 0 []  - Emergency Hospital Admission (emergent condition) 0 X - Simple Discharge Coordination 1 10 []  - Complex (extensive) Discharge Coordination 0 PROCESS - Special Needs []  - Pediatric / Minor Patient Management 0 []  - Isolation Patient Management 0 []  - Hearing / Language / Visual special needs 0 []  - Assessment of Community assistance (transportation, D/C planning, etc.) 0 []  - Additional assistance / Altered mentation 0 []  - Support Surface(s)  Assessment (bed, cushion, seat, etc.) 0 INTERVENTIONS - Wound Cleansing / Measurement X - Simple Wound Cleansing - one wound 1 5 []  - Complex Wound Cleansing - multiple  wounds 0 X - Wound Imaging (photographs - any number of wounds) 1 5 []  - Wound Tracing (instead of photographs) 0 X - Simple Wound Measurement - one wound 1 5 []  - Complex Wound Measurement - multiple wounds 0 INTERVENTIONS - Wound Dressings []  - Small Wound Dressing one or multiple wounds 0 []  - Medium Wound Dressing one or multiple wounds 0 X - Large Wound Dressing one or multiple wounds 1 20 []  - Application of Medications - topical 0 []  - Application of Medications - injection 0 Stansbery, Ashaunte B. ([id number]) INTERVENTIONS - Miscellaneous []  - External ear exam 0 []  - Specimen Collection (cultures, biopsies, blood, body fluids, etc.) 0 []  - Specimen(s) / Culture(s) sent or taken to Lab for analysis 0 []  - Patient Transfer (multiple staff / Harrel Lemon Lift / Similar devices) 0 []  - Simple Staple / Suture removal (25 or less) 0 []  - Complex Staple / Suture removal (26 or more) 0 []  - Hypo / Hyperglycemic Management (close monitor of Blood Glucose) 0 []  - Ankle / Brachial Index (ABI) - do not check if billed separately 0 X - Vital Signs 1 5 Has the patient been seen at the hospital within the last three years: Yes Total Score: 90 Level Of Care: New/Established - Level 3 Electronic Signature(s) Signed: 11/06/2016 5:35:11 PM By: Montey Hora Entered By: Montey Hora on 11/06/2016 15:14:58 Noller, [name] ([id number]) -------------------------------------------------------------------------------- Encounter Discharge Information Details Patient Name: [name] Date of Service: 11/06/2016 2:30 PM Medical Record Patient Account Number: [id number] Number: Treating RN: Montey Hora 10-May-1941 (76 y.o. Other Clinician: Date of Birth/Sex: Female) Treating ROBSON, MICHAEL Primary Care Larson Limones:  Leighton Ruff Broadus Costilla/Extender: G Referring Seneca Gadbois: Bethanie Dicker in Treatment: 11 Encounter Discharge Information Items Discharge Pain Level: 0 Discharge Condition: Stable Ambulatory Status: Wheelchair Discharge Destination: Home Private Transportation: Auto Accompanied By: friend Schedule Follow-up Appointment: Yes Medication Reconciliation completed and No provided to Patient/Care Aileana Hodder: Clinical Summary of Care: Electronic Signature(s) Signed: 11/06/2016 3:13:49 PM By: Montey Hora Entered By: Montey Hora on 11/06/2016 15:13:49 Beza, [name] ([id number]) -------------------------------------------------------------------------------- Patient/Caregiver Education Details Patient Name: [name] Date of Service: 11/06/2016 2:30 PM Medical Record Patient Account Number: [id number] Number: Treating RN: Montey Hora [birth year] (76 y.o. Other Clinician: Date of Birth/Gender: Female) Treating ROBSON, MICHAEL Primary Care Physician: Leighton Ruff Physician/Extender: G Referring Physician: Bethanie Dicker in Treatment: 11 Education Assessment Education Provided To: Patient Education Topics Provided Venous: Handouts: Other: leg elevation Methods: Explain/Verbal Responses: State content correctly Electronic Signature(s) Signed: 11/06/2016 5:35:11 PM By: Montey Hora Entered By: Montey Hora on 11/06/2016 15:13:28 Leoni, [name] ([id number]) -------------------------------------------------------------------------------- Wound Assessment Details Patient Name: [name] Date of Service: 11/06/2016 2:30 PM Medical Record Patient Account Number: [id number] Number: Treating RN: Montey Hora 1940/11/28 (76 y.o. Other Clinician: Date of Birth/Sex: Female) Treating ROBSON, [place] Primary Care Assia Meanor: Leighton Ruff Amal Renbarger/Extender: G Referring Fusaye Wachtel: Bethanie Dicker in  Treatment: 11 Wound Status Wound Number: 2 Primary Diabetic Wound/Ulcer of the Lower Etiology: Extremity Wound Location: Left Calcaneus - Lateral Wound Open Wounding Event: Gradually Appeared Status: Date Acquired: 08/12/2016 Comorbid Anemia, Coronary Artery Disease, Weeks Of Treatment: 11 History: Hypertension, Myocardial Infarction, Clustered Wound: No Type II Diabetes, Osteoarthritis, Neuropathy Wound Measurements Length: (cm) 2.4 Width: (cm) 2.5 Depth: (cm) 0.2 Area: (cm) 4.712 Volume: (cm) 0.942 % Reduction in Area: 35.1% % Reduction in Volume: 35.1% Epithelialization: None Tunneling: No Undermining: No Wound Description Classification: Grade 1 Wound Margin: Flat and Intact Exudate  Amount: Large Exudate Type: Serous Exudate Color: amber Foul Odor After Cleansing: Yes Due to Product Use: No Slough/Fibrino Yes Wound Bed Granulation Amount: Medium (34-66%) Exposed Structure Granulation Quality: Red Fascia Exposed: No Necrotic Amount: Medium (34-66%) Fat Layer (Subcutaneous Tissue) Exposed: No Necrotic Quality: Eschar, Adherent Slough Tendon Exposed: No Muscle Exposed: No Joint Exposed: No Bone Exposed: No Limited to Skin Breakdown Periwound Skin Texture Texture Color No Abnormalities Noted: No No Abnormalities Noted: No Sivley, Latoyia B. ([id number]) Callus: No Atrophie Blanche: No Crepitus: No Cyanosis: No Excoriation: No Ecchymosis: No Induration: No Erythema: No Rash: No Hemosiderin Staining: No Scarring: No Mottled: No Pallor: No Moisture Rubor: No No Abnormalities Noted: No Dry / Scaly: No Temperature / Pain Maceration: Yes Temperature: No Abnormality Tenderness on Palpation: Yes Wound Preparation Ulcer Cleansing: Rinsed/Irrigated with Saline Topical Anesthetic Applied: Other: lidocaine 4%, Treatment Notes Wound #2 (Left, Lateral Calcaneus) 1. Cleansed with: Cleanse wound with antibacterial soap and water 5. Secondary Dressing  Applied ABD Pad 7. Secured with Tape Other (specify in notes) Notes kerlix and coban wrap, charcoal - Apligraf and mepitel and steri strips left intact Electronic Signature(s) Signed: 11/06/2016 3:10:59 PM By: Montey Hora Entered By: Montey Hora on 11/06/2016 15:10:59

## 2016-11-13 ENCOUNTER — Encounter: Payer: PPO | Attending: Nurse Practitioner | Admitting: Nurse Practitioner

## 2016-11-13 DIAGNOSIS — E1122 Type 2 diabetes mellitus with diabetic chronic kidney disease: Secondary | ICD-10-CM | POA: Insufficient documentation

## 2016-11-13 DIAGNOSIS — E11621 Type 2 diabetes mellitus with foot ulcer: Secondary | ICD-10-CM | POA: Diagnosis not present

## 2016-11-13 DIAGNOSIS — Z89511 Acquired absence of right leg below knee: Secondary | ICD-10-CM | POA: Insufficient documentation

## 2016-11-13 DIAGNOSIS — M1990 Unspecified osteoarthritis, unspecified site: Secondary | ICD-10-CM | POA: Diagnosis not present

## 2016-11-13 DIAGNOSIS — Z794 Long term (current) use of insulin: Secondary | ICD-10-CM | POA: Diagnosis not present

## 2016-11-13 DIAGNOSIS — L97423 Non-pressure chronic ulcer of left heel and midfoot with necrosis of muscle: Secondary | ICD-10-CM | POA: Insufficient documentation

## 2016-11-13 DIAGNOSIS — Z87891 Personal history of nicotine dependence: Secondary | ICD-10-CM | POA: Insufficient documentation

## 2016-11-13 DIAGNOSIS — E1151 Type 2 diabetes mellitus with diabetic peripheral angiopathy without gangrene: Secondary | ICD-10-CM | POA: Insufficient documentation

## 2016-11-13 DIAGNOSIS — N183 Chronic kidney disease, stage 3 (moderate): Secondary | ICD-10-CM | POA: Insufficient documentation

## 2016-11-13 DIAGNOSIS — Z8551 Personal history of malignant neoplasm of bladder: Secondary | ICD-10-CM | POA: Insufficient documentation

## 2016-11-13 DIAGNOSIS — L97221 Non-pressure chronic ulcer of left calf limited to breakdown of skin: Secondary | ICD-10-CM | POA: Diagnosis not present

## 2016-11-13 DIAGNOSIS — I87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: Secondary | ICD-10-CM | POA: Insufficient documentation

## 2016-11-13 DIAGNOSIS — I2510 Atherosclerotic heart disease of native coronary artery without angina pectoris: Secondary | ICD-10-CM | POA: Insufficient documentation

## 2016-11-13 DIAGNOSIS — D649 Anemia, unspecified: Secondary | ICD-10-CM | POA: Diagnosis not present

## 2016-11-13 DIAGNOSIS — E1140 Type 2 diabetes mellitus with diabetic neuropathy, unspecified: Secondary | ICD-10-CM | POA: Insufficient documentation

## 2016-11-13 DIAGNOSIS — I252 Old myocardial infarction: Secondary | ICD-10-CM | POA: Diagnosis not present

## 2016-11-13 DIAGNOSIS — I4891 Unspecified atrial fibrillation: Secondary | ICD-10-CM | POA: Insufficient documentation

## 2016-11-13 DIAGNOSIS — Z936 Other artificial openings of urinary tract status: Secondary | ICD-10-CM | POA: Diagnosis not present

## 2016-11-13 DIAGNOSIS — Z992 Dependence on renal dialysis: Secondary | ICD-10-CM | POA: Diagnosis not present

## 2016-11-13 DIAGNOSIS — I129 Hypertensive chronic kidney disease with stage 1 through stage 4 chronic kidney disease, or unspecified chronic kidney disease: Secondary | ICD-10-CM | POA: Diagnosis not present

## 2016-11-13 DIAGNOSIS — Z7901 Long term (current) use of anticoagulants: Secondary | ICD-10-CM | POA: Insufficient documentation

## 2016-11-13 DIAGNOSIS — L97422 Non-pressure chronic ulcer of left heel and midfoot with fat layer exposed: Secondary | ICD-10-CM | POA: Diagnosis not present

## 2016-11-13 DIAGNOSIS — E785 Hyperlipidemia, unspecified: Secondary | ICD-10-CM | POA: Diagnosis not present

## 2016-11-13 DIAGNOSIS — L03116 Cellulitis of left lower limb: Secondary | ICD-10-CM | POA: Insufficient documentation

## 2016-11-13 DIAGNOSIS — H3530 Unspecified macular degeneration: Secondary | ICD-10-CM | POA: Diagnosis not present

## 2016-11-14 NOTE — Progress Notes (Signed)
[name] ([id number]) Visit Report for 11/13/2016 Arrival Information Details Patient Name: [name], [name] Date of Service: 11/13/2016 2:30 PM Medical Record Number: [id number] Patient Account Number: [id number] Date of Birth/Sex: [birth year] (76 y.o. Female) Treating RN: Montey Hora Primary Care Heiress Williamson: Leighton Ruff Other Clinician: Referring Taneasha Fuqua: Leighton Ruff Treating Aylin Rhoads/Extender: Tito Dine in Treatment: 12 Visit Information History Since Last Visit Added or deleted any medications: No Patient Arrived: Wheel Chair Any new allergies or adverse reactions: No Arrival Time: 14:10 Had a fall or experienced change in No Accompanied By: friend activities of daily living that may affect Transfer Assistance: Manual risk of falls: Patient Identification Verified: Yes Signs or symptoms of abuse/neglect since last No Secondary Verification Process Yes visito Completed: Hospitalized since last visit: No Patient Has Alerts: Yes Has Dressing in Place as Prescribed: Yes Patient Alerts: Patient on Blood Has Compression in Place as Prescribed: Yes Thinner Pain Present Now: No eliquis DMII ABI Marie >220 Electronic Signature(s) Signed: 11/13/2016 4:58:09 PM By: Montey Hora Entered By: Montey Hora on 11/13/2016 14:13:06 [name], [name] ([id number]) -------------------------------------------------------------------------------- Encounter Discharge Information Details Patient Name: [name] Date of Service: 11/13/2016 2:30 PM Medical Record Number: [id number] Patient Account Number: [id number] Date of Birth/Sex: 1940/10/20 (76 y.o. Female) Treating RN: Montey Hora Primary Care Amaranta Mehl: Leighton Ruff Other Clinician: Referring Cincere Deprey: Leighton Ruff Treating Rigley Niess/Extender: Cathie Olden in Treatment: 12 Encounter Discharge Information Items Discharge Pain Level: 0 Discharge Condition: Stable Ambulatory  Status: Wheelchair Discharge Destination: Home Transportation: Private Auto Accompanied By: friend Schedule Follow-up Appointment: Yes Medication Reconciliation completed No and provided to Patient/Care Can Lucci: Provided on Clinical Summary of Care: 11/13/2016 Form Type Recipient Paper Patient BI Electronic Signature(s) Signed: 11/13/2016 3:16:23 PM By: Ruthine Dose Entered By: Ruthine Dose on 11/13/2016 15:16:23 [name], [name] ([id number]) -------------------------------------------------------------------------------- Lower Extremity Assessment Details Patient Name: [name] Date of Service: 11/13/2016 2:30 PM Medical Record Number: [id number] Patient Account Number: [id number] Date of Birth/Sex: 1940/11/25 (76 y.o. Female) Treating RN: Montey Hora Primary Care Eberardo Demello: Leighton Ruff Other Clinician: Referring Aime Meloche: Leighton Ruff Treating Lundy Cozart/Extender: Cathie Olden in Treatment: 12 Vascular Assessment Pulses: Dorsalis Pedis Palpable: [Left:Yes] Posterior Tibial Extremity colors, hair growth, and conditions: Extremity Color: [Left:Hyperpigmented] Hair Growth on Extremity: [Left:No] Temperature of Extremity: [Left:Warm] Capillary Refill: [Left:< 3 seconds] Electronic Signature(s) Signed: 11/13/2016 4:58:09 PM By: Montey Hora Entered By: Montey Hora on 11/13/2016 14:23:33 [name], [name] ([id number]) -------------------------------------------------------------------------------- Multi Wound Chart Details Patient Name: [name] Date of Service: 11/13/2016 2:30 PM Medical Record Number: [id number] Patient Account Number: [id number] Date of Birth/Sex: 1940/11/28 (76 y.o. 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] Comorbid History: [2:Anemia, Coronary Artery N/A Disease, Hypertension, Myocardial Infarction, Type II Diabetes, Osteoarthritis, Neuropathy] Date Acquired: [2:08/12/2016] [N/A:N/A] Weeks of Treatment: [2:12] [N/A:N/A] Wound Status: [2:Open] [N/A:N/A] Measurements L x W x D 1.9x2.2x0.2 [N/A:N/A] (cm) Area (cm) : [2:3.283] [N/A:N/A] Volume (cm) : [2:0.657] [N/A:N/A] % Reduction in Area: [2:54.80%] [N/A:N/A] % Reduction in Volume: 54.70% [N/A:N/A] Classification: [2:Grade 1] [N/A:N/A] Exudate Amount: [2:Large] [N/A:N/A] Exudate Type: [2:Serous] [N/A:N/A] Exudate Color: [2:amber] [N/A:N/A] Foul Odor After [2:Yes] [N/A:N/A] Cleansing:  Odor Anticipated Due to No [N/A:N/A] Product Use: Wound Margin: [2:Flat and Intact] [N/A:N/A] Granulation Amount: [2:Medium (34-66%)] [N/A:N/A] Granulation Quality: [2:Red] [N/A:N/A] Necrotic Amount: Medium (34-66%) N/A N/A Necrotic Tissue: Eschar, Adherent Slough N/A N/A Exposed Structures: Fascia: No N/A N/A Fat Layer (Subcutaneous Tissue) Exposed: No Tendon: No Muscle: No Joint: No Bone: No Limited to Skin Breakdown Epithelialization: None N/A N/A Debridement: Debridement (23536- N/A N/A 11047) Pre-procedure 14:41 N/A N/A Verification/Time Out Taken: Pain Control: Lidocaine 4% Topical N/A N/A Solution Tissue Debrided: Fibrin/Slough, N/A N/A Subcutaneous Level: Skin/Subcutaneous N/A N/A Tissue Debridement Area (sq 4.18 N/A N/A cm): Instrument: Curette N/A N/A Bleeding: Minimum N/A N/A Hemostasis Achieved: Pressure N/A N/A Procedural Pain: 0 N/A N/A Post Procedural Pain: 0 N/A N/A Debridement  Treatment Procedure was tolerated N/A N/A Response: well Post Debridement 1.9x2.2x0.3 N/A N/A Measurements L x W x D (cm) Post Debridement 0.985 N/A N/A Volume: (cm) Periwound Skin Texture: Excoriation: No N/A N/A Induration: No Callus: No Crepitus: No Rash: No Scarring: No Periwound Skin Maceration: Yes N/A N/A Moisture: Dry/Scaly: No Periwound Skin Color: Atrophie Blanche: No N/A N/A Cyanosis: No Ecchymosis: No Erythema: No Hemosiderin Staining: No Shabazz, [name] B. ([id number]) Mottled: No Pallor: No Rubor: No Temperature: No Abnormality N/A N/A Tenderness on Yes N/A N/A Palpation: Wound Preparation: Ulcer Cleansing: N/A N/A Rinsed/Irrigated with Saline Topical Anesthetic Applied: Other: lidocaine 4% Procedures Performed: Cellular or Tissue Based N/A N/A Product Debridement Treatment Notes Wound #2 (Left, Lateral Calcaneus) 1. Cleansed with: Clean wound with Normal Saline 2. Anesthetic Topical Lidocaine 4% cream to wound bed prior to debridement 4. Dressing Applied: Aquacel Mepitel Other dressing (specify in notes) 7. Secured with Tape Other (specify in notes) Notes kerlix and coban wrap, charcoal - Apligraf applied today by Denny Peon Electronic Signature(s) Signed: 11/13/2016 3:26:53 PM By: Lawanda Cousins Entered By: Lawanda Cousins on 11/13/2016 15:26:53 [name], [name] ([id number]) -------------------------------------------------------------------------------- [place] Details Patient Name: [name] Date of Service: 11/13/2016 2:30 PM Medical Record Number: [id number] Patient Account Number: [id number] Date of Birth/Sex: 1940/10/17 (76 y.o. Female) Treating RN: Montey Hora Primary Care Nil Xiong: Leighton Ruff Other Clinician: Referring Steffan Caniglia: Leighton Ruff Treating Lyza Houseworth/Extender: Cathie Olden in Treatment: 12 Active Inactive ` Abuse / Safety / Falls / Self Care Management Nursing  Diagnoses: Potential for falls Goals: Patient will remain injury free Date Initiated: 08/21/2016 Target Resolution Date: 10/08/2016 Goal Status: Active Interventions: Assess fall risk on admission and as needed Notes: ` Necrotic Tissue Nursing Diagnoses: Impaired tissue integrity related to necrotic/devitalized tissue Goals: Necrotic/devitalized tissue will be minimized in the wound bed Date Initiated: 08/21/2016 Target Resolution Date: 10/08/2016 Goal Status: Active Interventions: Assess patient pain level pre-, during and post procedure and prior to discharge Notes: ` Orientation to the Wound Care Program Nursing Diagnoses: Knowledge deficit related to the wound healing center program GENEVIEVE, [name] ([id number]) Goals: Patient/caregiver will verbalize understanding of the [facility] Date Initiated: 08/21/2016 Target Resolution Date: 10/08/2016 Goal Status: Active Interventions: Provide education on orientation to the wound center Notes: ` Peripheral Neuropathy Nursing Diagnoses: Potential alteration in peripheral tissue perfusion (select prior to confirmation of diagnosis) Goals: Patient/caregiver will verbalize understanding of disease process and disease management Date Initiated: 08/21/2016 Target Resolution Date: 10/08/2016 Goal Status: Active Interventions: Assess signs and symptoms of neuropathy upon admission and as needed Notes: ` Venous Leg Ulcer Nursing Diagnoses: Actual venous Insuffiency (use after diagnosis is confirmed) Goals: Patient will maintain optimal edema control Date Initiated: 08/21/2016 Target Resolution Date: 10/08/2016 Goal  Status: Active Interventions: Assess peripheral edema status every visit. Notes: ` Wound/Skin Impairment Nursing Diagnoses: Impaired tissue integrity Giglia, [name] B. ([id number]) Goals: Patient/caregiver will verbalize understanding of skin care regimen Date Initiated: 08/21/2016 Target  Resolution Date: 10/08/2016 Goal Status: Active Ulcer/skin breakdown will have a volume reduction of 30% by week 4 Date Initiated: 08/21/2016 Target Resolution Date: 10/08/2016 Goal Status: Active Ulcer/skin breakdown will have a volume reduction of 50% by week 8 Date Initiated: 08/21/2016 Target Resolution Date: 10/29/2016 Goal Status: Active Ulcer/skin breakdown will have a volume reduction of 80% by week 12 Date Initiated: 08/21/2016 Target Resolution Date: 11/12/2016 Goal Status: Active Ulcer/skin breakdown will heal within 14 weeks Date Initiated: 08/21/2016 Target Resolution Date: 11/12/2016 Goal Status: Active Interventions: Assess patient/caregiver ability to obtain necessary supplies Assess patient/caregiver ability to perform ulcer/skin care regimen upon admission and as needed Assess ulceration(s) every visit Notes: Electronic Signature(s) Signed: 11/13/2016 4:58:09 PM By: Montey Hora Entered By: Montey Hora on 11/13/2016 14:23:39 [name], [name] ([id number]) -------------------------------------------------------------------------------- Pain Assessment Details Patient Name: [name] Date of Service: 11/13/2016 2:30 PM Medical Record Number: [id number] Patient Account Number: [id number] Date of Birth/Sex: 22-Feb-1941 (76 y.o. Female) Treating RN: Montey Hora Primary Care Javiana Anwar: Leighton Ruff Other Clinician: Referring Benjamen Koelling: Leighton Ruff Treating Rohan Juenger/Extender: Tito Dine in Treatment: 12 Active Problems Location of Pain Severity and Description of Pain Patient Has Paino No Site Locations Pain Management and Medication Current Pain Management: Notes Topical or injectable lidocaine is offered to patient for acute pain when surgical debridement is performed. If needed, Patient is instructed to use over the counter pain medication for the following 24-48 hours after debridement. Wound care MDs do not prescribed pain  medications. Patient has chronic pain or uncontrolled pain. Patient has been instructed to make an appointment with their Primary Care Physician for pain management. Electronic Signature(s) Signed: 11/13/2016 4:58:09 PM By: Montey Hora Entered By: Montey Hora on 11/13/2016 14:13:14 [name], [name] ([id number]) -------------------------------------------------------------------------------- Patient/Caregiver Education Details Patient Name: [name] Date of Service: 11/13/2016 2:30 PM Medical Record Number: [id number] Patient Account Number: [id number] Date of Birth/Gender: 11-Jan-1941 (76 y.o. Female) Treating RN: Montey Hora Primary Care Physician: Leighton Ruff Other Clinician: Referring Physician: Leighton Ruff Treating Physician/Extender: Cathie Olden in Treatment: 12 Education Assessment Education Provided To: Patient Education Topics Provided Venous: Handouts: Other: leg elevation Methods: Explain/Verbal Responses: State content correctly Electronic Signature(s) Signed: 11/13/2016 4:58:09 PM By: Montey Hora Entered By: Montey Hora on 11/13/2016 14:40:23 [name], [name] ([id number]) -------------------------------------------------------------------------------- Wound Assessment Details Patient Name: [name] Date of Service: 11/13/2016 2:30 PM Medical Record Number: [id number] Patient Account Number: [id number] Date of Birth/Sex: 03-15-1941 (76 y.o. Female) Treating RN: Montey Hora Primary Care Ermal Brzozowski: Leighton Ruff Other Clinician: Referring Marvelle Span: Leighton Ruff Treating Krystel Fletchall/Extender: Tito Dine in Treatment: 12 Wound Status Wound Number: 2 Primary Diabetic Wound/Ulcer of the Lower Etiology: Extremity Wound Location: Left Calcaneus - Lateral Wound Open Wounding Event: Gradually Appeared Status: Date Acquired: 08/12/2016 Comorbid Anemia, Coronary Artery Disease, Weeks Of Treatment:  12 History: Hypertension, Myocardial Infarction, Clustered Wound: No Type II Diabetes, Osteoarthritis, Neuropathy Photos Photo Uploaded By: Montey Hora on 11/13/2016 17:03:35 Wound Measurements Length: (cm) 1.9 Width: (cm) 2.2 Depth: (cm) 0.2 Area: (cm) 3.283 Volume: (cm) 0.657 % Reduction in Area: 54.8% % Reduction in Volume: 54.7% Epithelialization: None Tunneling: No Undermining: No Wound Description Classification: Grade 1 Foul Odor Afte Wound Margin: Flat and Intact Due to Product Exudate Amount: Large Slough/Fibrino Exudate Type: Serous Exudate  Color: amber r Cleansing: Yes Use: No Yes Wound Bed Granulation Amount: Medium (34-66%) Exposed Structure Granulation Quality: Red Fascia Exposed: No Necrotic Amount: Medium (34-66%) Fat Layer (Subcutaneous Tissue) Exposed: No Devinney, Antonae B. ([id number]) Necrotic Quality: Eschar, Adherent Slough Tendon Exposed: No Muscle Exposed: No Joint Exposed: No Bone Exposed: No Limited to Skin Breakdown Periwound Skin Texture Texture Color No Abnormalities Noted: No No Abnormalities Noted: No Callus: No Atrophie Blanche: No Crepitus: No Cyanosis: No Excoriation: No Ecchymosis: No Induration: No Erythema: No Rash: No Hemosiderin Staining: No Scarring: No Mottled: No Pallor: No Moisture Rubor: No No Abnormalities Noted: No Dry / Scaly: No Temperature / Pain Maceration: Yes Temperature: No Abnormality Tenderness on Palpation: Yes Wound Preparation Ulcer Cleansing: Rinsed/Irrigated with Saline Topical Anesthetic Applied: Other: lidocaine 4%, Treatment Notes Wound #2 (Left, Lateral Calcaneus) 1. Cleansed with: Clean wound with Normal Saline 2. Anesthetic Topical Lidocaine 4% cream to wound bed prior to debridement 4. Dressing Applied: Aquacel Mepitel Other dressing (specify in notes) 7. Secured with Tape Other (specify in notes) Notes kerlix and coban wrap, charcoal - Apligraf applied today by  Denny Peon Electronic Signature(s) Signed: 11/13/2016 4:58:09 PM By: Montey Hora Entered By: Montey Hora on 11/13/2016 14:23:06 [name], [name] ([id number]) -------------------------------------------------------------------------------- [place] Details Patient Name: [name] Date of Service: 11/13/2016 2:30 PM Medical Record Number: [id number] Patient Account Number: [id number] Date of Birth/Sex: 13-Jan-1941 (76 y.o. Female) Treating RN: Montey Hora Primary Care Hennie Gosa: Leighton Ruff Other Clinician: Referring Dareth Andrew: Leighton Ruff Treating Myda Detwiler/Extender: Tito Dine in Treatment: 12 Vital Signs Time Taken: 14:13 Temperature (F): 98.1 Height (in): 63 Pulse (bpm): 64 Weight (lbs): 205 Respiratory Rate (breaths/min): 18 Body Mass Index (BMI): 36.3 Blood Pressure (mmHg): 111/51 Reference Range: 80 - 120 mg / dl Electronic Signature(s) Signed: 11/13/2016 4:58:09 PM By: Montey Hora Entered By: Montey Hora on 11/13/2016 14:13:51

## 2016-11-14 NOTE — Progress Notes (Signed)
NICLE, CONNOLE ([id number]) Visit Report for 11/13/2016 Chief Complaint Document Details Patient Name: [name], [name] Date of Service: 11/13/2016 2:30 PM Medical Record Number: [id number] Patient Account Number: [id number] Date of Birth/Sex: 1940/12/24 (76 y.o. Female) Treating RN: Montey Hora Primary Care Provider: Leighton Ruff Other Clinician: Referring Provider: Leighton Ruff Treating Provider/Extender: Cathie Olden in Treatment: 12 Information Obtained from: Patient Chief Complaint Patient is here for review of a wound on the left heel Electronic Signature(s) Signed: 11/13/2016 3:28:15 PM By: Lawanda Cousins Entered By: Lawanda Cousins on 11/13/2016 15:28:15 Stucky, [name] ([id number]) -------------------------------------------------------------------------------- Cellular or Tissue Based Product Details Patient Name: [name] Date of Service: 11/13/2016 2:30 PM Medical Record Number: [id number] Patient Account Number: [id number] Date of Birth/Sex: 1941-06-07 (76 y.o. Female) Treating RN: Montey Hora Primary Care Provider: Leighton Ruff Other Clinician: Referring Provider: Leighton Ruff Treating Provider/Extender: Cathie Olden in Treatment: 12 Cellular or Tissue Based Wound #2 Left,Lateral Calcaneus Product Type Applied to: Performed By: Physician Lawanda Cousins, NP Cellular or Tissue Based Apligraf Product Type: Pre-procedure Yes - 14:54 Verification/Time Out Taken: Location: genitalia / hands / feet / multiple digits Wound Size (sq cm): 4.18 Product Size (sq cm): 44 Waste Size (sq cm): 40 Waste Reason: wound size Amount of Product Applied (sq cm): 4 Lot #: [id number] Expiration Date: 11/20/2016 Fenestrated: Yes Instrument: Blade Reconstituted: Yes Solution Type: ns Solution Amount: 26ml Lot #: [id number] Solution Expiration 08/12/2018 Date: Secured: Yes Secured With: Steri-Strips Dressing Applied: Yes Primary  Dressing: mepitel 1 Procedural Pain: 0 Post Procedural Pain: 0 Response to Treatment: Procedure was tolerated well Post Procedure Diagnosis Same as Pre-procedure Electronic Signature(s) Signed: 11/13/2016 3:28:04 PM By: Melody Haver, [name] ([id number]) Entered By: Lawanda Cousins on 11/13/2016 15:28:04 [name] ([id number]) -------------------------------------------------------------------------------- Debridement Details Patient Name: [name] Date of Service: 11/13/2016 2:30 PM Medical Record Number: [id number] Patient Account Number: [id number] Date of Birth/Sex: 1941/02/15 (76 y.o. Female) Treating RN: Montey Hora Primary Care Provider: Leighton Ruff Other Clinician: Referring Provider: Leighton Ruff Treating Provider/Extender: Cathie Olden in Treatment: 12 Debridement Performed for Wound #2 Left,Lateral Calcaneus Assessment: Performed By: Physician Lawanda Cousins, NP Debridement: Debridement Pre-procedure Yes - 14:41 Verification/Time Out Taken: Start Time: 14:41 Pain Control: Lidocaine 4% Topical Solution Level: Skin/Subcutaneous Tissue Total Area Debrided (L x 1.9 (cm) x 2.2 (cm) = 4.18 (cm) W): Tissue and other Viable, Non-Viable, Fibrin/Slough, Subcutaneous material debrided: Instrument: Curette Bleeding: Minimum Hemostasis Achieved: Pressure End Time: 14:47 Procedural Pain: 0 Post Procedural Pain: 0 Response to Treatment: Procedure was tolerated well Post Debridement Measurements of Total Wound Length: (cm) 1.9 Width: (cm) 2.2 Depth: (cm) 0.3 Volume: (cm) 0.985 Character of Wound/Ulcer Post Improved Debridement: Severity of Tissue Post Debridement: Fat layer exposed Post Procedure Diagnosis Same as Pre-procedure Electronic Signature(s) Signed: 11/13/2016 3:27:08 PM By: Lawanda Cousins Signed: 11/13/2016 4:58:09 PM By: Montey Hora Entered By: Lawanda Cousins on 11/13/2016 15:27:08 Swearingin, [name]  ([id number]) Weigel, [name] ([id number]) -------------------------------------------------------------------------------- HPI Details Patient Name: [name] Date of Service: 11/13/2016 2:30 PM Medical Record Number: [id number] Patient Account Number: [id number] Date of Birth/Sex: 03-07-1941 (76 y.o. Female) Treating RN: Montey Hora Primary Care Provider: Leighton Ruff Other Clinician: Referring Provider: Leighton Ruff Treating Provider/Extender: Cathie Olden in Treatment: 12 History of Present Illness HPI Description: 08/21/16; Mrs. Halliwell is a 76 year old woman who I cared for in our [facility] clinic up until January 2016. At that point she had a wound in her left great toe. 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 He follows with vein and vascular and has a follow-up appointment in early February. She is status left femoral to anterior tibial bypass in February 2016. She is also noted to have venous stasis changes and a history of ulcers. 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 fib on Eliquis. She has bladder cancer and has a chronic urostomy. The patient tells me that 2 weeks ago she had a right prosthesis and a new left diabetic shoe. Roughly one week ago she noted a wound on her left lateral heel. She zinc oxide and some silver alginate she had left over until she could be seen here. It was also noted that she has had blistering on her posterior left calf and an open area here as well. Her records note that she has chronic venous insufficiency with chronic inflammation especially in the left mid calf area. The patient has not been systemically unwell no fever or  chills. She is reasonably insensate and has not felt any pain 09/04/16; the patient has a fairly substantial wound on the left lateral calcaneus. We have been using Santyl to this area. Blister on the left lower leg appears to of healed the patient also follows with Dr. Sherren Mocha early of vein and vascular. He felt that she should have enough blood flow to heal her left heel wound. He renewed her doxycycline for a further week. She is status post left femoral to anterior tibial bypass in February 2016. 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 She has a remote history of a right BKA. The XRAY that I ordered last week showed no definite evidence of osteomyelitis however a small focus of demineralization was noted in the plantar aspect of the calcaneus which might warrant an MRI possibly indicating early osteomyelitis however this is not in the area of her wound and I'm going to keep this under advisement for now. 09/11/16; still a substantial wound in the left lateral calcaneus we've been using Santyl 09/18/16; still a substantial wound on the left lateral calcaneus. We are still using Santyl 09/24/16; still a substantial wound on the left lateral calcaneus. We're using Santyl change one or 2 times a week and mechanical debridement which he comes to [place]. 10/02/16; left lateral calcaneus using Santyl changing 3 times a week. Run Apligraf through her insurance 10/09/16; still using Santyl without much change. Being changed 2 times a week by the patient and once here. We did not run Apligraf last week we'll do that this week 10/16/16; the area on her left lateral heel; started Iodoflex last week. Apligraf set a co-pay of $250 per for Simon, Daney B. ([id number]) application 7/67/34; left lateral heel. on iodoflex. approved for appligraft, start next week 10/30/16 Apligraf #1 today 11/13/16- patient is here for follow up evaluation of her left lateral calcaneus ulcer. She had Apligraf #1 placed 2 weeks  ago. She is tolerating compression. She is voicing no complaints or concerns. She did return last week for nurse visit. Electronic Signature(s) Signed: 11/13/2016 3:29:32 PM By: Lawanda Cousins Entered By: Lawanda Cousins on 11/13/2016 15:29:32 Molnar, [name] ([id number]) -------------------------------------------------------------------------------- Physical Exam Details Patient Name: [name] Date of  Service: 11/13/2016 2:30 PM Medical Record Number: [id number] Patient Account Number: [id number] Date of Birth/Sex: 1941/06/19 (76 y.o. Female) Treating RN: Montey Hora Primary Care Provider: Leighton Ruff Other Clinician: Referring Provider: Leighton Ruff Treating Provider/Extender: Cathie Olden in Treatment: 12 Constitutional BP within normal limits. afebrile. well nourished; well developed; appears stated age;Marland Kitchen Respiratory non-labored respiratory effort. Cardiovascular LLE- palpable DP, non-palpable PT. Integumentary (Hair, Skin) Left lateral calcaneous ulcer- upon initial exam it appears to have nonviable tissue on the surface, does not appear to be residual Apligraf this was easily debrided to reveal a red granular wound base, periwound maceration, no periwound erythema, no malodor. Psychiatric appears to make sound judgement and have accurate insight regarding healthcare. oriented to time, place, person and situation. calm, pleasant, conversive. Electronic Signature(s) Signed: 11/13/2016 3:32:18 PM By: Lawanda Cousins Entered By: Lawanda Cousins on 11/13/2016 15:32:18 [name] ([id number]) -------------------------------------------------------------------------------- Physician Orders Details Patient Name: [name] Date of Service: 11/13/2016 2:30 PM Medical Record Number: [id number] Patient Account Number: [id number] Date of Birth/Sex: September 01, 1940 (76 y.o. Female) Treating RN: Montey Hora Primary Care Provider: Leighton Ruff Other  Clinician: Referring Provider: Leighton Ruff Treating Provider/Extender: Cathie Olden in Treatment: 12 Verbal / Phone Orders: No Diagnosis Coding Wound Cleansing Wound #2 Left,Lateral Calcaneus o Clean wound with Normal Saline. Anesthetic Wound #2 Left,Lateral Calcaneus o Topical Lidocaine 4% cream applied to wound bed prior to debridement Skin Barriers/Peri-Wound Care Wound #2 Left,Lateral Calcaneus o Skin Prep Primary Wound Dressing Wound #2 Left,Lateral Calcaneus o Aquacel o Other: - Apligraph Secondary Dressing Wound #2 Left,Lateral Calcaneus o Other - steri-strips, mepitel, charcoal Dressing Change Frequency Wound #2 Left,Lateral Calcaneus o Change dressing every week Follow-up Appointments Wound #2 Left,Lateral Calcaneus o Return Appointment in 1 week. o Nurse Visit as needed - next week Edema Control Wound #2 Left,Lateral Calcaneus o Kerlix and Coban - Left Lower Extremity - unna to anchor Barnard, Ahlivia B. ([id number]) Off-Loading Wound #2 Left,Lateral Calcaneus o Other: - open heel shoe Additional Orders / Instructions Wound #2 Left,Lateral Calcaneus o Increase protein intake. Electronic Signature(s) Signed: 11/13/2016 4:22:06 PM By: Lawanda Cousins Signed: 11/13/2016 4:58:09 PM By: Montey Hora Entered By: Montey Hora on 11/13/2016 15:02:44 Moseman, [name] ([id number]) -------------------------------------------------------------------------------- Problem List Details Patient Name: [name] Date of Service: 11/13/2016 2:30 PM Medical Record Number: [id number] Patient Account Number: [id number] Date of Birth/Sex: 1941/07/31 (76 y.o. Female) Treating RN: Montey Hora Primary Care Provider: Leighton Ruff Other Clinician: Referring Provider: Leighton Ruff Treating Provider/Extender: Cathie Olden in Treatment: 12 Active Problems ICD-10 Encounter Code Description Active Date Diagnosis E11.621  Type 2 diabetes mellitus with foot ulcer 08/21/2016 Yes E11.51 Type 2 diabetes mellitus with diabetic peripheral 08/21/2016 Yes angiopathy without gangrene E11.40 Type 2 diabetes mellitus with diabetic neuropathy, 08/21/2016 Yes unspecified L97.423 Non-pressure chronic ulcer of left heel and midfoot with 08/21/2016 Yes necrosis of muscle I87.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 hypertension (idiopathic) with 08/21/2016 Yes inflammation of left lower extremity L97.221 Non-pressure chronic ulcer of left calf limited to 08/21/2016 Yes breakdown of skin L03.116 Cellulitis of left lower limb 08/21/2016 Yes Inactive Problems Resolved Problems Electronic Signature(s) Signed: 11/13/2016 3:26:45 PM By: Lawanda Cousins Entered By: Lawanda Cousins on 11/13/2016 15:26:44 Rigdon, [name] ([id number]) Mason, [name] ([id number]) -------------------------------------------------------------------------------- Progress Note Details Patient Name: [name] Date of Service: 11/13/2016 2:30 PM Medical Record Number: [id number] Patient Account Number: [id number] Date of Birth/Sex: [birth year] (76 y.o. Female) Treating RN: Montey Hora Primary Care Provider: Leighton Ruff Other Clinician: Referring Provider: Leighton Ruff  Treating Provider/Extender: Cathie Olden in Treatment: 12 Subjective Chief Complaint Information obtained from Patient Patient is here for review of a wound on the left heel History of Present Illness (HPI) 08/21/16; Mrs. Rebel is a 76 year old woman who I cared for in our [facility] clinic up until January 2016. At that point she had a wound in her left great toe. 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 He follows with vein and vascular and has a follow-up appointment in early February. She is status left femoral to  anterior tibial bypass in February 2016. She is also noted to have venous stasis changes and a history of ulcers. 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 fib on Eliquis. She has bladder cancer and has a chronic urostomy. The patient tells me that 2 weeks ago she had a right prosthesis and a new left diabetic shoe. Roughly one week ago she noted a wound on her left lateral heel. She zinc oxide and some silver alginate she had left over until she could be seen here. It was also noted that she has had blistering on her posterior left calf and an open area here as well. Her records note that she has chronic venous insufficiency with chronic inflammation especially in the left mid calf area. The patient has not been systemically unwell no fever or chills. She is reasonably insensate and has not felt any pain 09/04/16; the patient has a fairly substantial wound on the left lateral calcaneus. We have been using Santyl to this area. Blister on the left lower leg appears to of healed the patient also follows with Dr. Sherren Mocha early of vein and vascular. He felt that she should have enough blood flow to heal her left heel wound. He renewed her doxycycline for a further week. She is status post left femoral to anterior tibial bypass in February 2016. 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 She has a remote history of a right BKA. The XRAY that I ordered last week showed no definite evidence of osteomyelitis however a small focus of demineralization was noted in the plantar aspect of the calcaneus which might warrant an MRI possibly indicating early osteomyelitis however this is not in the area of her wound and I'm going to keep this under advisement for now. 09/11/16; still a substantial wound in the left lateral calcaneus we've been using Santyl 09/18/16; still a  substantial wound on the left lateral calcaneus. We are still using Santyl 09/24/16; still a substantial wound on the left lateral calcaneus. We're using Santyl change one or 2 times a Glendening, Sharis B. ([id number]) week and mechanical debridement which he comes to [place]. 10/02/16; left lateral calcaneus using Santyl changing 3 times a week. Run Apligraf through her insurance 10/09/16; still using Santyl without much change. Being changed 2 times a week by the patient and once here. We did not run Apligraf last week we'll do that this week 10/16/16; the area on her left lateral heel; started Iodoflex last week. Apligraf set a co-pay of $355 per for application 9/74/16; left lateral heel. on iodoflex. approved for appligraft, start next week 10/30/16 Apligraf #1 today 11/13/16- patient is here for follow  up evaluation of her left lateral calcaneus ulcer. She had Apligraf #1 placed 2 weeks ago. She is tolerating compression. She is voicing no complaints or concerns. She did return last week for nurse visit. Objective Constitutional BP within normal limits. afebrile. well nourished; well developed; appears stated age;Marland Kitchen Vitals Time Taken: 2:13 PM, Height: 63 in, Weight: 205 lbs, BMI: 36.3, Temperature: 98.1 F, Pulse: 64 bpm, Respiratory Rate: 18 breaths/min, Blood Pressure: 111/51 mmHg. Respiratory non-labored respiratory effort. Cardiovascular LLE- palpable DP, non-palpable PT. Psychiatric appears to make sound judgement and have accurate insight regarding healthcare. oriented to time, place, person and situation. calm, pleasant, conversive. Integumentary (Hair, Skin) Left lateral calcaneous ulcer- upon initial exam it appears to have nonviable tissue on the surface, does not appear to be residual Apligraf this was easily debrided to reveal a red granular wound base, periwound maceration, no periwound erythema, no malodor. Wound #2 status is Open. Original cause of wound was Gradually Appeared. The  wound is located on the Left,Lateral Calcaneus. The wound measures 1.9cm length x 2.2cm width x 0.2cm depth; 3.283cm^2 area and 0.657cm^3 volume. The wound is limited to skin breakdown. There is no tunneling or undermining noted. There is a large amount of serous drainage noted. The wound margin is flat and intact. There is medium (34-66%) red granulation within the wound bed. There is a medium (34-66%) amount of necrotic tissue within the wound bed including Eschar and Adherent Slough. The periwound skin appearance Brabender, Mykaylah B. ([id number]) exhibited: Maceration. The periwound skin appearance did not exhibit: Callus, Crepitus, Excoriation, Induration, Rash, Scarring, Dry/Scaly, Atrophie Blanche, Cyanosis, Ecchymosis, Hemosiderin Staining, Mottled, Pallor, Rubor, Erythema. Periwound temperature was noted as No Abnormality. The periwound has tenderness on palpation. 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 There was a Skin/Subcutaneous Tissue Debridement ([id number]) debridement with total area of 4.18 sq cm performed by Lawanda Cousins, NP. with the following instrument(s): Curette to remove Viable and Non-Viable tissue/material including Fibrin/Slough and Subcutaneous after achieving pain control using Lidocaine 4% Topical Solution. A time out was conducted at 14:41, prior to  the start of the procedure. A Minimum amount of bleeding was controlled with Pressure. The procedure was tolerated well with a pain level of 0 throughout and a pain level of 0 following the procedure. Post Debridement Measurements: 1.9cm length x 2.2cm width x 0.3cm depth; 0.985cm^3 volume. Character of Wound/Ulcer Post Debridement is improved. Severity of Tissue Post Debridement is: Fat layer exposed. Post procedure Diagnosis Wound #2: Same as Pre-Procedure Wound #2 is a Diabetic Wound/Ulcer of the Lower Extremity located on the Left,Lateral Calcaneus. A skin graft procedure using a bioengineered skin substitute/cellular or tissue based product was performed by Lawanda Cousins, NP. Apligraf was applied and secured with Steri-Strips. 4 sq cm of product was utilized and 40 sq cm was wasted due to wound size. Post Application, mepitel 1 was applied. A Time Out was conducted at 14:54, prior to the start of the procedure. The procedure  was tolerated well with a pain level of 0 throughout and a pain level of 0 following the procedure. Post procedure Diagnosis Wound #2: Same as Pre-Procedure Haecker, Rieley B. ([id number]) . Plan Wound Cleansing: Wound #2 Left,Lateral Calcaneus: Clean wound with Normal Saline. Anesthetic: Wound #2 Left,Lateral Calcaneus: Topical Lidocaine 4% cream applied to wound bed prior to debridement Skin Barriers/Peri-Wound Care: Wound #2 Left,Lateral Calcaneus: Skin Prep Primary Wound Dressing: Wound #2 Left,Lateral Calcaneus: Aquacel Other: - Apligraph Secondary Dressing: Wound #2 Left,Lateral Calcaneus: Other - steri-strips, mepitel, charcoal Dressing Change Frequency: Wound #2 Left,Lateral Calcaneus: Change dressing every week Follow-up Appointments: Wound #2 Left,Lateral Calcaneus: Return Appointment in 1 week. Nurse Visit as needed - next week Edema Control: Wound #2 Left,Lateral Calcaneus: Kerlix and Coban - Left Lower Extremity - unna to  anchor Off-Loading: Wound #2 Left,Lateral Calcaneus: Other: - open heel shoe Additional Orders / Instructions: Wound #2 Left,Lateral Calcaneus: Increase protein intake. 1. Apligraf #2 applied 2. Return next week for nurse visit TAWNY, RASPBERRY B. ([id number]) 3. Follow-up in 2 weeks Electronic Signature(s) Signed: 11/13/2016 3:32:54 PM By: Lawanda Cousins Entered By: Lawanda Cousins on 11/13/2016 15:32:54 Lutz, [name] ([id number]) -------------------------------------------------------------------------------- SuperBill Details Patient Name: [name] Date of Service: 11/13/2016 Medical Record Number: [id number] Patient Account Number: [id number] Date of Birth/Sex: 01/11/1941 (76 y.o. 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 I87.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 L97.221 Non-pressure chronic ulcer of left calf limited to breakdown of skin L03.116 Cellulitis of left lower limb Facility Procedures CPT4: Description Modifier Quantity Code [id number] (Facility Use Only) Apligraf 44 SQ CM 27 CPT4: [id number] - SKIN SUB GRAFT FACE/NK/HF/G 1 ICD-10 Description Diagnosis E11.621 Type 2 diabetes mellitus with foot ulcer E11.51 Type 2 diabetes mellitus with diabetic peripheral angiopathy without gangrene L97.423 Non-pressure chronic ulcer of  left heel and midfoot with necrosis of muscle Physician Procedures CPT4: Description Modifier  Quantity Code [id number] - WC PHYS SKIN SUB GRAFT FACE/NK/HF/G 1 ICD-10 Description Diagnosis E11.621 Type 2 diabetes mellitus with foot ulcer E11.51 Type 2 diabetes mellitus with diabetic peripheral angiopathy without  gangrene 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 ([id number]) Electronic Signature(s) Signed: 11/13/2016 3:33:05 PM By: Lawanda Cousins Previous Signature: 11/13/2016 3:30:00 PM Version By: Montey Hora Entered By: Lawanda Cousins on 11/13/2016 15:33:05

## 2016-11-17 ENCOUNTER — Other Ambulatory Visit: Payer: Self-pay | Admitting: Cardiology

## 2016-11-20 ENCOUNTER — Ambulatory Visit: Payer: PPO

## 2016-11-21 DIAGNOSIS — E11621 Type 2 diabetes mellitus with foot ulcer: Secondary | ICD-10-CM | POA: Diagnosis not present

## 2016-11-23 NOTE — Progress Notes (Addendum)
EMORY, LEAVER ([id number]) Visit Report for 11/21/2016 Arrival Information Details Patient Name: [name], [name] Date of Service: 11/21/2016 1:30 PM Medical Record Number: [id number] Patient Account Number: [id number] Date of Birth/Sex: [birth year] (76 y.o. Female) Treating RN: Montey Hora Primary Care Samarth Ogle: Leighton Ruff Other Clinician: Referring Jonica Bickhart: Leighton Ruff Treating Zakari Couchman/Extender: Suella Grove in Treatment: 13 Visit Information History Since Last Visit Added or deleted any medications: No Patient Arrived: Wheel Chair Any new allergies or adverse reactions: No Arrival Time: 13:35 Had a fall or experienced change in No Accompanied By: friend activities of daily living that may affect Transfer Assistance: None risk of falls: Patient Identification Verified: Yes Signs or symptoms of abuse/neglect since last No Secondary Verification Process Yes visito Completed: Hospitalized since last visit: No Patient Has Alerts: Yes Has Dressing in Place as Prescribed: Yes Patient Alerts: Patient on Blood Has Compression in Place as Prescribed: Yes Thinner Pain Present Now: No eliquis DMII ABI [place] >220 Electronic Signature(s) Signed: 11/21/2016 2:25:31 PM By: Montey Hora Entered By: Montey Hora on 11/21/2016 14:25:31 Waskey, [name] ([id number]) -------------------------------------------------------------------------------- Clinic Level of Care Assessment Details Patient Name: [name] Date of Service: 11/21/2016 1:30 PM Medical Record Number: [id number] Patient Account Number: [id number] Date of Birth/Sex: Apr 03, 1941 (76 y.o. Female) Treating RN: Montey Hora Primary Care Jalayiah Bibian: Leighton Ruff Other Clinician: Referring Braidan Ricciardi: Leighton Ruff Treating Gracemarie Skeet/Extender: Suella Grove in Treatment: 13 Clinic Level of Care Assessment Items TOOL 4 Quantity Score []  - Use when only an EandM is performed on FOLLOW-UP visit 0 ASSESSMENTS  - Nursing Assessment / Reassessment X - Reassessment of Co-morbidities (includes updates in patient status) 1 10 X - Reassessment of Adherence to Treatment Plan 1 5 ASSESSMENTS - Wound and Skin Assessment / Reassessment X - Simple Wound Assessment / Reassessment - one wound 1 5 []  - Complex Wound Assessment / Reassessment - multiple wounds 0 []  - Dermatologic / Skin Assessment (not related to wound area) 0 ASSESSMENTS - Focused Assessment []  - Circumferential Edema Measurements - multi extremities 0 []  - Nutritional Assessment / Counseling / Intervention 0 X - Lower Extremity Assessment (monofilament, tuning fork, pulses) 1 5 []  - Peripheral Arterial Disease Assessment (using hand held doppler) 0 ASSESSMENTS - Ostomy and/or Continence Assessment and Care []  - Incontinence Assessment and Management 0 []  - Ostomy Care Assessment and Management (repouching, etc.) 0 PROCESS - Coordination of Care X - Simple Patient / Family Education for ongoing care 1 15 []  - Complex (extensive) Patient / Family Education for ongoing care 0 []  - Staff obtains Programmer, systems, Records, Test Results / Process Orders 0 []  - Staff telephones HHA, Nursing Homes / Clarify orders / etc 0 []  - Routine Transfer to another Facility (non-emergent condition) 0 Langston, Sherriann B. ([id number]) []  - Routine Hospital Admission (non-emergent condition) 0 []  - New Admissions / Biomedical engineer / Ordering NPWT, Apligraf, etc. 0 []  - Emergency Hospital Admission (emergent condition) 0 X - Simple Discharge Coordination 1 10 []  - Complex (extensive) Discharge Coordination 0 PROCESS - Special Needs []  - Pediatric / Minor Patient Management 0 []  - Isolation Patient Management 0 []  - Hearing / Language / Visual special needs 0 []  - Assessment of Community assistance (transportation, D/C planning, etc.) 0 []  - Additional assistance / Altered mentation 0 []  - Support Surface(s) Assessment (bed, cushion, seat, etc.)  0 INTERVENTIONS - Wound Cleansing / Measurement X - Simple Wound Cleansing - one wound 1 5 []  - Complex Wound Cleansing - multiple wounds 0 X - Wound Imaging (  photographs - any number of wounds) 1 5 []  - Wound Tracing (instead of photographs) 0 X - Simple Wound Measurement - one wound 1 5 []  - Complex Wound Measurement - multiple wounds 0 INTERVENTIONS - Wound Dressings []  - Small Wound Dressing one or multiple wounds 0 []  - Medium Wound Dressing one or multiple wounds 0 X - Large Wound Dressing one or multiple wounds 1 20 []  - Application of Medications - topical 0 []  - Application of Medications - injection 0 INTERVENTIONS - Miscellaneous []  - External ear exam 0 Arps, Evian B. ([id number]) []  - Specimen Collection (cultures, biopsies, blood, body fluids, etc.) 0 []  - Specimen(s) / Culture(s) sent or taken to Lab for analysis 0 []  - Patient Transfer (multiple staff / Harrel Lemon Lift / Similar devices) 0 []  - Simple Staple / Suture removal (25 or less) 0 []  - Complex Staple / Suture removal (26 or more) 0 []  - Hypo / Hyperglycemic Management (close monitor of Blood Glucose) 0 []  - Ankle / Brachial Index (ABI) - do not check if billed separately 0 X - Vital Signs 1 5 Has the patient been seen at the hospital within the last three years: Yes Total Score: 90 Level Of Care: New/Established - Level 3 Electronic Signature(s) Signed: 11/21/2016 4:55:58 PM By: Montey Hora Entered By: Montey Hora on 11/21/2016 14:27:56 Swaney, [name] ([id number]) -------------------------------------------------------------------------------- Encounter Discharge Information Details Patient Name: [name] Date of Service: 11/21/2016 1:30 PM Medical Record Number: [id number] Patient Account Number: [id number] Date of Birth/Sex: 31-Jan-1941 (76 y.o. Female) Treating RN: Montey Hora Primary Care Lelaina Oatis: Leighton Ruff Other Clinician: Referring Jailon Schaible: Leighton Ruff Treating  Denisha Hoel/Extender: Suella Grove in Treatment: 13 Encounter Discharge Information Items Discharge Pain Level: 0 Discharge Condition: Stable Ambulatory Status: Wheelchair Discharge Destination: Home Private Transportation: Auto Accompanied By: friend Schedule Follow-up Appointment: Yes Medication Reconciliation completed and No provided to Patient/Care Yeiden Frenkel: Clinical Summary of Care: Electronic Signature(s) Signed: 11/21/2016 2:27:29 PM By: Montey Hora Entered By: Montey Hora on 11/21/2016 14:27:29 Judy, [name] ([id number]) -------------------------------------------------------------------------------- Patient/Caregiver Education Details Patient Name: [name] Date of Service: 11/21/2016 1:30 PM Medical Record Number: [id number] Patient Account Number: [id number] Date of Birth/Gender: 11-Sep-1940 (76 y.o. Female) Treating RN: Montey Hora Primary Care Physician: Leighton Ruff Other Clinician: Referring Physician: Leighton Ruff Treating Physician/Extender: Suella Grove in Treatment: 13 Education Assessment Education Provided To: Patient Education Topics Provided Wound/Skin Impairment: Handouts: Other: be careful not to hurt toes Methods: Explain/Verbal Responses: State content correctly Electronic Signature(s) Signed: 11/21/2016 4:55:58 PM By: Montey Hora Entered By: Montey Hora on 11/21/2016 14:27:10 Waner, [name] ([id number]) -------------------------------------------------------------------------------- Wound Assessment Details Patient Name: [name] Date of Service: 11/21/2016 1:30 PM Medical Record Number: [id number] Patient Account Number: [id number] Date of Birth/Sex: 26-Aug-1940 (76 y.o. Female) Treating RN: Montey Hora Primary Care Keyry Iracheta: Leighton Ruff Other Clinician: Referring Kyrene Longan: Leighton Ruff Treating Jerolene Kupfer/Extender: Suella Grove in Treatment: 13 Wound Status Wound Number: 2 Primary Diabetic Wound/Ulcer  of the Lower Etiology: Extremity Wound Location: Left Calcaneus - Lateral Wound Open Wounding Event: Gradually Appeared Status: Date Acquired: 08/12/2016 Comorbid Anemia, Coronary Artery Disease, Weeks Of Treatment: 13 History: Hypertension, Myocardial Infarction, Clustered Wound: No Type II Diabetes, Osteoarthritis, Neuropathy Wound Measurements Length: (cm) 1.9 Width: (cm) 2.2 Depth: (cm) 0.2 Area: (cm) 3.283 Volume: (cm) 0.657 % Reduction in Area: 54.8% % Reduction in Volume: 54.7% Epithelialization: None Tunneling: No Undermining: No Wound Description Classification: Grade 1 Wound Margin: Flat and Intact Exudate Amount: Large Exudate Type: Serous Exudate Color: amber Foul Odor  After Cleansing: Yes Due to Product Use: No Slough/Fibrino Yes Wound Bed Granulation Amount: Medium (34-66%) Exposed Structure Granulation Quality: Red Fascia Exposed: No Necrotic Amount: Medium (34-66%) Fat Layer (Subcutaneous Tissue) Exposed: No Necrotic Quality: Eschar, Adherent Slough Tendon Exposed: No Muscle Exposed: No Joint Exposed: No Bone Exposed: No Limited to Skin Breakdown Periwound Skin Texture Texture Color No Abnormalities Noted: No No Abnormalities Noted: No Callus: No Atrophie Blanche: No Crepitus: No Cyanosis: No Akkerman, Estefany B. ([id number]) Excoriation: No Ecchymosis: No Induration: No Erythema: No Rash: No Hemosiderin Staining: No Scarring: No Mottled: No Pallor: No Moisture Rubor: No No Abnormalities Noted: No Dry / Scaly: No Temperature / Pain Maceration: Yes Temperature: No Abnormality Tenderness on Palpation: Yes Wound Preparation Ulcer Cleansing: Rinsed/Irrigated with Saline Topical Anesthetic Applied: Other: lidocaine 4%, Treatment Notes Wound #2 (Left, Lateral Calcaneus) 1. Cleansed with: Cleanse wound with antibacterial soap and water 4. Dressing Applied: Aquacel Other dressing (specify in notes) 7. Secured with Tape Other  (specify in notes) Notes kerlix and coban wrap, charcoal - Apligraf, mepitel and steri strips left intact Electronic Signature(s) Signed: 11/21/2016 2:25:52 PM By: Montey Hora Entered By: Montey Hora on 11/21/2016 14:25:52

## 2016-11-25 ENCOUNTER — Other Ambulatory Visit: Payer: Self-pay | Admitting: *Deleted
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
## 2016-11-27 ENCOUNTER — Encounter: Payer: PPO | Admitting: Internal Medicine

## 2016-11-27 DIAGNOSIS — E11621 Type 2 diabetes mellitus with foot ulcer: Secondary | ICD-10-CM | POA: Diagnosis not present

## 2016-11-27 DIAGNOSIS — L97422 Non-pressure chronic ulcer of left heel and midfoot with fat layer exposed: Secondary | ICD-10-CM | POA: Diagnosis not present

## 2016-11-28 ENCOUNTER — Other Ambulatory Visit: Payer: Self-pay | Admitting: Cardiology

## 2016-11-28 ENCOUNTER — Other Ambulatory Visit: Payer: Self-pay | Admitting: Pharmacist

## 2016-11-28 NOTE — Progress Notes (Signed)
[name], [name] ([id number]) Visit Report for 11/27/2016 Chief Complaint Document Details Patient Name: [name] Date of Service: 11/27/2016 1:30 PM Medical Record Patient Account Number: [id number] Number: Treating RN: [name] 01/11/1941 (76 y.o. Other Clinician: Date of Birth/Sex: Female) Treating [name] Primary Care Provider: Leighton [name] Provider/Extender: G Referring Provider: Bethanie [name] in Treatment: 14 Information Obtained from: Patient Chief Complaint Patient is here for review of a wound on the left heel Electronic Signature(s) Signed: 11/27/2016 5:12:22 PM By: [name] Ham MD Entered By: [name] on 11/27/2016 14:58:40 [name], Bea [name] ([id number]) -------------------------------------------------------------------------------- Cellular or Tissue Based Product Details Patient Name: [name] Date of Service: 11/27/2016 1:30 PM Medical Record Patient Account Number: [id number] Number: Treating RN: [name] 01/03/1941 (76 y.o. Other Clinician: Date of Birth/Sex: Female) Treating [name] Primary Care Provider: Leighton [name] Provider/Extender: G Referring Provider: Bethanie [name] in Treatment: 14 Cellular or Tissue Based Wound #2 Left,Lateral Calcaneus Product Type Applied to: Performed By: Physician [name] Dillon, MD Cellular or Tissue Based Apligraf Product Type: Pre-procedure Yes - 14:14 Verification/Time Out Taken: Location: genitalia / hands / feet / multiple digits Wound Size (sq cm): 3 Product Size (sq cm): 44 Waste Size (sq cm): 41 Waste Reason: wound size Amount of Product Applied (sq cm): 3 Lot #: [id number] Expiration Date: 12/07/2016 Fenestrated: Yes Instrument: Blade Reconstituted: Yes Solution Type: NS Solution Amount: 15ml Lot #: [id number] Solution Expiration 07/12/2018 Date: Secured: Yes Secured With: Steri-Strips Dressing  Applied: Yes Primary Dressing: mepitel one Procedural Pain: 0 Post Procedural Pain: 0 Response to Treatment: Procedure was tolerated well Post Procedure Diagnosis Same as Pre-procedure Electronic Signature(s) [name] ([id number]) Signed: 11/27/2016 5:12:22 PM By: [name] Ham MD Entered By: [name] on 11/27/2016 14:58:23 [name] ([id number]) -------------------------------------------------------------------------------- Debridement Details Patient Name: [name] Date of Service: 11/27/2016 1:30 PM Medical Record Patient Account Number: [id number] Number: Treating RN: [name] 1941/05/09 (76 y.o. Other Clinician: Date of Birth/Sex: Female) Treating Kendre Jacinto Primary Care Provider: Leighton [name] Provider/Extender: G Referring Provider: Bethanie [name] in Treatment: 14 Debridement Performed for Wound #2 Left,Lateral Calcaneus Assessment: Performed By: Physician [name] Dillon, MD Debridement: Debridement Pre-procedure Yes - 14:09 Verification/Time Out Taken: Start Time: 14:09 Pain Control: Lidocaine 4% Topical Solution Level: Skin/Subcutaneous Tissue Total Area Debrided (L x 1.5 (cm) x 2 (cm) = 3 (cm) W): Tissue and other Viable, Non-Viable, Fibrin/Slough, Skin, Subcutaneous material debrided: Instrument: Curette Bleeding: Minimum Hemostasis Achieved: Pressure End Time: 14:10 Procedural Pain: 0 Post Procedural Pain: 0 Response to Treatment: Procedure was tolerated well Post Debridement Measurements of Total Wound Length: (cm) 1.5 Width: (cm) 2 Depth: (cm) 0.2 Volume: (cm) 0.471 Character of Wound/Ulcer Post Improved Debridement: Severity of Tissue Post Debridement: Fat layer exposed Post Procedure Diagnosis Same as Pre-procedure Electronic Signature(s) Signed: 11/27/2016 4:56:33 PM By: [name] Signed: 11/27/2016 5:12:22 PM By: [name] Ham MD [name]  ([id number]) Entered By: [name] on 11/27/2016 14:58:31 [name] ([id number]) -------------------------------------------------------------------------------- HPI Details Patient Name: [name] Date of Service: 11/27/2016 1:30 PM Medical Record Patient Account Number: [id number] Number: Treating RN: [name] [birth year] (76 y.o. Other Clinician: Date of Birth/Sex: Female) Treating [name] Primary Care Provider: Leighton [name] Provider/Extender: G Referring Provider: Bethanie [name] in Treatment: 14 History of Present Illness HPI Description: 08/21/16; [name] is a 76 year old woman who I cared for in our [facility] clinic up until January 2016. At that point she had  a wound in her left great toe. 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 He follows with vein and vascular and has a follow-up appointment in early February. She is status left femoral to anterior tibial bypass in February 2016. She is also noted to have venous stasis changes and a history of ulcers. 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 fib on Eliquis. She has bladder cancer and has a chronic urostomy. The patient tells me that 2 weeks ago she had a right prosthesis and a new left diabetic shoe. Roughly one week ago she noted a wound on her left lateral heel. She zinc oxide and some silver alginate she had left over until she could be seen here. It was also noted that she has had blistering on her posterior left calf and an open area here as well. Her records note that she has chronic venous insufficiency with chronic inflammation especially in the left mid calf  area. The patient has not been systemically unwell no fever or chills. She is reasonably insensate and has not felt any pain 09/04/16; the patient has a fairly substantial wound on the left lateral calcaneus. We have been using Santyl to this area. Blister on the left lower leg appears to of healed the patient also follows with Dr. Sherren Mocha early of vein and vascular. He felt that she should have enough blood flow to heal her left heel wound. He renewed her doxycycline for a further week. She is status post left femoral to anterior tibial bypass in February 2016. 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 She has a remote history of a right BKA. The XRAY that I ordered last week showed no definite evidence of osteomyelitis however a small focus of demineralization was noted in the plantar aspect of the calcaneus which might warrant an MRI possibly indicating early osteomyelitis however this is not in the area of her wound and I'm going to keep this under advisement for now. 09/11/16; still a substantial wound in the left lateral calcaneus we've been using Santyl 09/18/16; still a substantial wound on the left lateral calcaneus. We are still using Santyl 09/24/16; still a substantial wound on the left lateral calcaneus. We're using Santyl change one or 2 times a week and mechanical debridement which he comes to [place]. 10/02/16; left lateral calcaneus using Santyl changing 3 times a week. Run Apligraf through her insurance 10/09/16; still using Santyl without much change. Being changed 2 times a week by the patient and once Castell, Omer B. ([id number]) here. We did not run Apligraf last week we'll do that this week 10/16/16; the area on her left lateral heel; started Iodoflex last week. Apligraf set a co-pay of $409 per for application 03/22/90; left lateral heel. on iodoflex. approved for appligraft, start next week 10/30/16 Apligraf #1 today 11/13/16- patient is here for follow up evaluation of her left  lateral calcaneus ulcer. She had Apligraf #1 placed 2 weeks ago. She is tolerating compression. She is voicing no complaints or concerns. She did return last week for nurse visit. 11/27/16; dramatic improvement in the overall appearance of this wound. What looks to be a tinea infection around this. Apligraf #3  Electronic Signature(s) Signed: 11/27/2016 5:12:22 PM By: [name] Ham MD Entered By: [name] on 11/27/2016 14:59:26 Sappington, Bea [name] ([id number]) -------------------------------------------------------------------------------- Physical Exam Details Patient Name: [name] Date of Service: 11/27/2016 1:30 PM Medical Record Patient Account Number: [id number] Number: Treating RN: [name] 1941/08/06 (76 y.o. Other Clinician: Date of Birth/Sex: Female) Treating Dorethy Tomey Primary Care Provider: Leighton [name] Provider/Extender: G Referring Provider: Bethanie [name] in Treatment: 14 Constitutional Sitting or standing Blood Pressure is within target range for patient.. Pulse regular and within target range for patient.Marland Kitchen Respirations regular, non-labored and within target range.. Temperature is normal and within the target range for the patient.. Patient's appearance is neat and clean. Appears in no acute distress. Well nourished and well developed.. Cardiovascular Pedal pulses palpable on the left. Integumentary (Hair, Skin) What appears to be a tinea infection and/or contact dermatitis superiorly into the foot from the wound. Notes Wound exam; tremendous improvement in the 4 weeks since I've seen this wound. Healthy granulation. Using a #3 curet surface slough removed and some nonviable subcutaneous tissue from around the wound. After debridement using standard technique the Apligraf is reapplied. There is clearly advancing epithelialization. Electronic Signature(s) Signed: 11/27/2016 5:12:22 PM By: [name] Ham MD Entered By:  [name] on 11/27/2016 15:01:06 Gambrill, Bea [name] ([id number]) -------------------------------------------------------------------------------- Physician Orders Details Patient Name: [name] Date of Service: 11/27/2016 1:30 PM Medical Record Patient Account Number: [id number] Number: Treating RN: [name] 09/04/1940 (76 y.o. Other Clinician: Date of Birth/Sex: Female) Treating Tamlyn Sides Primary Care Provider: Leighton [name] Provider/Extender: G Referring Provider: Bethanie [name] in Treatment: 72 Verbal / Phone Orders: No Diagnosis Coding Wound Cleansing Wound #2 Left,Lateral Calcaneus o Clean wound with Normal Saline. Anesthetic Wound #2 Left,Lateral Calcaneus o Topical Lidocaine 4% cream applied to wound bed prior to debridement Skin Barriers/Peri-Wound Care Wound #2 Left,Lateral Calcaneus o Skin Prep o Antifungal cream Primary Wound Dressing Wound #2 Left,Lateral Calcaneus o Mepitel One Contact layer o XtraSorb o Other: - Apligraph Secondary Dressing Wound #2 Left,Lateral Calcaneus o Other - steri-strips, mepitel, charcoal Dressing Change Frequency Wound #2 Left,Lateral Calcaneus o Change dressing every week Follow-up Appointments Wound #2 Left,Lateral Calcaneus o Return Appointment in 1 week. o Nurse Visit as needed - next week Edema Control Limbert, Idania B. ([id number]) Wound #2 Left,Lateral Calcaneus o Kerlix and Coban - Left Lower Extremity - unna to anchor Off-Loading Wound #2 Left,Lateral Calcaneus o Other: - open heel shoe Additional Orders / Instructions Wound #2 Left,Lateral Calcaneus o Increase protein intake. Electronic Signature(s) Signed: 11/27/2016 4:56:33 PM By: [name] Signed: 11/27/2016 5:12:22 PM By: [name] Ham MD Entered By: [name] on 11/27/2016 14:27:52 Grima, Bea [name]  ([id number]) -------------------------------------------------------------------------------- Problem List Details Patient Name: [name] Date of Service: 11/27/2016 1:30 PM Medical Record Patient Account Number: [id number] Number: Treating RN: [name] [birth year] (76 y.o. 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/21/2016 Yes necrosis of muscle I87.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 hypertension (idiopathic) with 08/21/2016 Yes inflammation of left lower extremity L97.221 Non-pressure chronic ulcer of left calf limited to 08/21/2016 Yes breakdown  of skin L03.116 Cellulitis of left lower limb 08/21/2016 Yes Inactive Problems Resolved Problems Electronic Signature(s) LATACHA, TEXEIRA ([id number]) Signed: 11/27/2016 5:12:22 PM By: [name] Ham MD Entered By: [name] on 11/27/2016 14:58:06 Zentner, Bea [name] ([id number]) -------------------------------------------------------------------------------- Progress Note Details Patient Name: [name] Date of Service: 11/27/2016 1:30 PM Medical Record Patient Account Number: [id number] Number: Treating RN: [name] 10/05/1940 (76 y.o. Other Clinician: Date of Birth/Sex: Female) Treating Orrie Lascano Primary Care Provider: Leighton [name] Provider/Extender: G Referring Provider: Bethanie [name] in Treatment: 14 Subjective Chief Complaint Information  obtained from Patient Patient is here for review of a wound on the left heel History of Present Illness (HPI) 08/21/16; Mrs. Onstott is a 76 year old woman who I cared for in our [facility] clinic up until January 2016. At that point she had a wound in her left great toe. 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 He follows with vein and vascular and has a follow-up appointment in early February. She is status left femoral to anterior tibial bypass in February 2016. She is also noted to have venous stasis changes and a history of ulcers. 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 fib on Eliquis. She has bladder cancer and has a chronic urostomy. The patient tells me that 2 weeks ago she had a right prosthesis and a new left diabetic shoe. Roughly one week ago she noted a wound on her left lateral heel. She zinc oxide and some silver alginate she had left over until she could be seen here. It was also noted that she has had blistering on her posterior left calf and an open area here as well. Her records note that she has chronic venous insufficiency with chronic inflammation especially in the left mid calf area. The patient has not been systemically unwell no fever or chills. She is reasonably insensate and has not felt any pain 09/04/16; the patient has a fairly substantial wound on the left lateral calcaneus. We have been using Santyl to this area. Blister on the left lower leg appears to of healed the patient also follows with Dr. Sherren Mocha early of vein and vascular. He felt that she should have enough blood flow to heal her left heel wound. He renewed her doxycycline for a further week. She is status post  left femoral to anterior tibial bypass in February 2016. 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 She has a remote history of a right BKA. The XRAY that I ordered last week showed no definite evidence of osteomyelitis however a small focus of demineralization was noted in the plantar aspect of the calcaneus which might warrant an MRI possibly indicating early osteomyelitis however this is not in the area of her wound and I'm going to keep this under advisement for now. 09/11/16; still a substantial wound in the left lateral calcaneus we've been using Santyl Dubeau, Chinaza B. ([id number]) 09/18/16; still a substantial wound on the left lateral calcaneus. We are still using Santyl 09/24/16; still a substantial wound on the left lateral calcaneus. We're using Santyl change one or 2 times a week and mechanical debridement which he comes to [place]. 10/02/16; left lateral calcaneus using  Santyl changing 3 times a week. Run Apligraf through her insurance 10/09/16; still using Santyl without much change. Being changed 2 times a week by the patient and once here. We did not run Apligraf last week we'll do that this week 10/16/16; the area on her left lateral heel; started Iodoflex last week. Apligraf set a co-pay of $161 per for application 0/96/04; left lateral heel. on iodoflex. approved for appligraft, start next week 10/30/16 Apligraf #1 today 11/13/16- patient is here for follow up evaluation of her left lateral calcaneus ulcer. She had Apligraf #1 placed 2 weeks ago. She is tolerating compression. She is voicing no complaints or concerns. She did return last week for nurse visit. 11/27/16; dramatic improvement in the overall appearance of this wound. What looks to be a tinea infection around this. Apligraf #3 Objective Constitutional Sitting or standing Blood Pressure is within target range for patient.. Pulse regular and within target range for patient.Marland Kitchen Respirations regular, non-labored and within  target range.. Temperature is normal and within the target range for the patient.. Patient's appearance is neat and clean. Appears in no acute distress. Well nourished and well developed.. Vitals Time Taken: 1:38 PM, Height: 63 in, Weight: 205 lbs, BMI: 36.3, Temperature: 98.4 F, Pulse: 65 bpm, Respiratory Rate: 18 breaths/min, Blood Pressure: 138/43 mmHg. Cardiovascular Pedal pulses palpable on the left. General Notes: Wound exam; tremendous improvement in the 4 weeks since I've seen this wound. Healthy granulation. Using a #3 curet surface slough removed and some nonviable subcutaneous tissue from around the wound. After debridement using standard technique the Apligraf is reapplied. There is clearly advancing epithelialization. Integumentary (Hair, Skin) What appears to be a tinea infection and/or contact dermatitis superiorly into the foot from the wound. Wound #2 status is Open. Original cause of wound was Gradually Appeared. The wound is located on the Left,Lateral Calcaneus. The wound measures 1.5cm length x 2cm width x 0.1cm depth; 2.356cm^2 area and 0.236cm^3 volume. The wound is limited to skin breakdown. There is no tunneling or undermining noted. There is a large amount of serous drainage noted. The wound margin is flat and intact. There is Apodaca, Zenaida B. ([id number]) medium (34-66%) red granulation within the wound bed. There is a medium (34-66%) amount of necrotic tissue within the wound bed including Eschar and Adherent Slough. The periwound skin appearance exhibited: Maceration. The periwound skin appearance did not exhibit: Callus, Crepitus, Excoriation, Induration, Rash, Scarring, Dry/Scaly, Atrophie Blanche, Cyanosis, Ecchymosis, Hemosiderin Staining, Mottled, Pallor, Rubor, Erythema. Periwound temperature was noted as No Abnormality. The periwound has tenderness on palpation. 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 There was a Skin/Subcutaneous Tissue Debridement ([id number]) debridement with total area of 3 sq cm performed by [name] Dillon, MD. with the following instrument(s): Curette to remove Viable and Non-Viable tissue/material including Fibrin/Slough, Skin, and Subcutaneous after achieving pain control using Lidocaine 4% Topical Solution. A time out was conducted at 14:09, prior to the start of the procedure.  A Minimum amount of bleeding was controlled with Pressure. The procedure was tolerated well with a pain level of 0 throughout and a pain level of 0 following the procedure. Post Debridement Measurements: 1.5cm length x 2cm width x 0.2cm depth; 0.471cm^3 volume. Character of Wound/Ulcer Post Debridement is improved. Severity of Tissue Post Debridement is: Fat layer exposed. Post procedure Diagnosis Wound #2: Same as Pre-Procedure Wound #2 is a Diabetic Wound/Ulcer of the Lower Extremity located on the Left,Lateral Calcaneus. A skin graft procedure using a bioengineered skin substitute/cellular or tissue based product was performed by [name] Dillon, MD. Apligraf was applied and secured with Steri-Strips. 3 sq cm of product was utilized and 41 sq cm was wasted due to wound size. Post Application, mepitel one was applied. A Time Out was conducted at 14:14, prior to the start of the procedure. The procedure was  tolerated well with a pain level of 0 throughout and a pain level of 0 following the procedure. Jeon, Tniyah B. ([id number]) Post procedure Diagnosis Wound #2: Same as Pre-Procedure . Plan Wound Cleansing: Wound #2 Left,Lateral Calcaneus: Clean wound with Normal Saline. Anesthetic: Wound #2 Left,Lateral Calcaneus: Topical Lidocaine 4% cream applied to wound bed prior to debridement Skin Barriers/Peri-Wound Care: Wound #2 Left,Lateral Calcaneus: Skin Prep Antifungal cream Primary Wound Dressing: Wound #2 Left,Lateral Calcaneus: Mepitel One Contact layer XtraSorb Other: - Apligraph Secondary Dressing: Wound #2 Left,Lateral Calcaneus: Other - steri-strips, mepitel, charcoal Dressing Change Frequency: Wound #2 Left,Lateral Calcaneus: Change dressing every week Follow-up Appointments: Wound #2 Left,Lateral Calcaneus: Return Appointment in 1 week. Nurse Visit as needed - next week Edema Control: Wound #2 Left,Lateral Calcaneus: Kerlix and Coban - Left Lower Extremity - unna to anchor Off-Loading: Wound #2 Left,Lateral Calcaneus: Other: - open heel shoe Additional Orders / Instructions: Wound #2 Left,Lateral Calcaneus: Increase protein intake. Cahoon, Ashliegh B. ([id number]) #1 nice improvement in the condition of this wound. There is been healthy granulation and the substantial depth of this is been reduced and nothing. There is advancing epithelialization #2 either tineal infection or contact dermatitis around the wound I favor the latter. We did apply topical antifungals and will remember this when she comes in next week for a external dressing change Electronic Signature(s) Signed: 11/27/2016 5:12:22 PM By: [name] Ham MD Entered By: [name] on 11/27/2016 15:05:20 Say, Bea [name] ([id number]) -------------------------------------------------------------------------------- SuperBill Details Patient Name: [name] Date of Service:  11/27/2016 Medical Record Patient Account Number: [id number] Number: Treating RN: [name] 1940-12-12 (76 y.o. 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 I87.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 L97.221 Non-pressure chronic ulcer of left calf limited to breakdown of skin L03.116 Cellulitis of left lower limb Facility Procedures CPT4 Code Description: [id number] (Facility Use Only) Apligraf 44 SQ CM Modifier: Quantity: 104 CPT4 Code Description: [id number] - SKIN SUB GRAFT FACE/NK/HF/G ICD-10 Description Diagnosis E11.621 Type 2 diabetes mellitus with foot ulcer L97.423 Non-pressure chronic ulcer of left heel and midfoot Modifier: with necrosis Quantity: 1 of muscle Physician Procedures CPT4 Code Description: 1884166 06301 - WC PHYS SKIN SUB GRAFT FACE/NK/HF/G ICD-10 Description Diagnosis  E11.621 Type 2 diabetes mellitus with foot ulcer L97.423 Non-pressure chronic ulcer of left heel and midfoot wit Modifier: h necrosis Quantity: 1 of muscle Electronic Signature(s) Signed: 11/27/2016 5:12:22 PM By: [name] Ham MD Entered By: [name] on 11/27/2016 15:05:56

## 2016-11-28 NOTE — Patient Outreach (Addendum)
[place] [facility]) Care Management  11/28/2016  KRISY DIX 1941-05-29 [id number]   Patient was called for follow up.  No answer. HIPAA compliant message left on patient's voicemail.  Plan:  Call patient back within 5-7 business days.  Close pharmacy case if no other issues or concerns.  [name], PharmD, [place] Clinical Pharmacist [phone]

## 2016-11-28 NOTE — Progress Notes (Signed)
JAKALA, HERFORD ([id number]) Visit Report for 11/27/2016 Arrival Information Details Patient Name: [name], [name] Date of Service: 11/27/2016 1:30 PM Medical Record Number: [id number] Patient Account Number: [id number] Date of Birth/Sex: [birth year] (76 y.o. Female) Treating RN: Montey Hora Primary Care Kimber Fritts: Leighton Ruff Other Clinician: Referring Paityn Balsam: Leighton Ruff Treating Tsuneo Faison/Extender: Tito Dine in Treatment: 14 Visit Information History Since Last Visit Added or deleted any medications: No Patient Arrived: Wheel Chair Any new allergies or adverse reactions: No Arrival Time: 13:36 Had a fall or experienced change in No Accompanied By: friend activities of daily living that may affect Transfer Assistance: None risk of falls: Patient Identification Verified: Yes Signs or symptoms of abuse/neglect since last No Secondary Verification Process Yes visito Completed: Hospitalized since last visit: No Patient Has Alerts: Yes Has Dressing in Place as Prescribed: Yes Patient Alerts: Patient on Blood Has Compression in Place as Prescribed: Yes Thinner Pain Present Now: No eliquis DMII ABI Boyd >220 Electronic Signature(s) Signed: 11/27/2016 4:56:33 PM By: Montey Hora Entered By: Montey Hora on 11/27/2016 13:37:20 [name], [name] ([id number]) -------------------------------------------------------------------------------- Encounter Discharge Information Details Patient Name: [name] Date of Service: 11/27/2016 1:30 PM Medical Record Number: [id number] Patient Account Number: [id number] Date of Birth/Sex: Nov 10, 1940 (76 y.o. Female) Treating RN: Montey Hora Primary Care Berdine Rasmusson: Leighton Ruff Other Clinician: Referring Latonya Nelon: Leighton Ruff Treating Caliope Ruppert/Extender: Tito Dine in Treatment: 14 Encounter Discharge Information Items Discharge Pain Level: 0 Discharge Condition:  Stable Ambulatory Status: Wheelchair Discharge Destination: Home Transportation: Private Auto Accompanied By: friend Schedule Follow-up Appointment: Yes Medication Reconciliation completed and provided to Patient/Care No Ulas Zuercher: Provided on Clinical Summary of Care: 11/27/2016 Form Type Recipient Paper Patient BI Electronic Signature(s) Signed: 11/27/2016 3:09:37 PM By: Montey Hora Previous Signature: 11/27/2016 2:45:07 PM Version By: Ruthine Dose Entered By: Montey Hora on 11/27/2016 15:09:37 Coen, [name] ([id number]) -------------------------------------------------------------------------------- Lower Extremity Assessment Details Patient Name: [name] Date of Service: 11/27/2016 1:30 PM Medical Record Number: [id number] Patient Account Number: [id number] Date of Birth/Sex: 11/25/1940 (76 y.o. Female) Treating RN: Montey Hora Primary Care Keviana Guida: Leighton Ruff Other Clinician: Referring Dalyla Chui: Leighton Ruff Treating Osie Merkin/Extender: Tito Dine in Treatment: 14 Vascular Assessment Pulses: Dorsalis Pedis Palpable: [Left:Yes] Posterior Tibial Extremity colors, hair growth, and conditions: Extremity Color: [Left:Hyperpigmented] Hair Growth on Extremity: [Left:No] Temperature of Extremity: [Left:Warm] Capillary Refill: [Left:< 3 seconds] Electronic Signature(s) Signed: 11/27/2016 4:56:33 PM By: Montey Hora Entered By: Montey Hora on 11/27/2016 13:48:38 [name], [name] ([id number]) -------------------------------------------------------------------------------- Multi Wound Chart Details Patient Name: [name] Date of Service: 11/27/2016 1:30 PM Medical Record Number: [id number] Patient Account Number: [id number] Date of Birth/Sex: 1941-07-18 (76 y.o. 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] Comorbid History: [2:Anemia, Coronary Artery N/A Disease, Hypertension, Myocardial Infarction, Type II Diabetes, Osteoarthritis, Neuropathy] Date Acquired: [2:08/12/2016] [N/A:N/A] Weeks of Treatment: [2:14] [N/A:N/A] Wound Status: [2:Open] [N/A:N/A] Measurements L x W x D 1.5x2x0.1 [N/A:N/A] (cm) Area (cm) : [2:2.356] [N/A:N/A] Volume (cm) : [2:0.236] [N/A:N/A] % Reduction in Area: [2:67.50%] [N/A:N/A] % Reduction in Volume: 83.70% [N/A:N/A] Classification: [2:Grade 1] [N/A:N/A] Exudate Amount: [2:Large] [N/A:N/A] Exudate Type: [  2:Serous] [N/A:N/A] Exudate Color: [2:amber] [N/A:N/A] Foul Odor After [2:Yes] [N/A:N/A] Cleansing: Odor Anticipated Due to No [N/A:N/A] Product Use: Wound Margin: [2:Flat and Intact] [N/A:N/A] Granulation Amount: [2:Medium (34-66%)] [N/A:N/A] Granulation Quality: [2:Red] [N/A:N/A] Necrotic Amount: Medium (34-66%) N/A N/A Necrotic Tissue: Eschar, Adherent Slough N/A N/A Exposed Structures: Fascia: No N/A N/A Fat Layer (Subcutaneous Tissue) Exposed: No Tendon: No Muscle: No Joint: No Bone: No Limited to Skin Breakdown Epithelialization: None N/A N/A Debridement: Debridement (33295- N/A N/A 11047) Pre-procedure 14:09 N/A N/A Verification/Time Out Taken: Pain Control: Lidocaine 4% Topical N/A N/A Solution Tissue Debrided: Fibrin/Slough, Skin, N/A N/A Subcutaneous Level: Skin/Subcutaneous N/A N/A Tissue Debridement Area (sq 3 N/A N/A cm): Instrument: Curette N/A N/A Bleeding: Minimum N/A N/A Hemostasis  Achieved: Pressure N/A N/A Procedural Pain: 0 N/A N/A Post Procedural Pain: 0 N/A N/A Debridement Treatment Procedure was tolerated N/A N/A Response: well Post Debridement 1.5x2x0.2 N/A N/A Measurements L x W x D (cm) Post Debridement 0.471 N/A N/A Volume: (cm) Periwound Skin Texture: Excoriation: No N/A N/A Induration: No Callus: No Crepitus: No Rash: No Scarring: No Periwound Skin Maceration: Yes N/A N/A Moisture: Dry/Scaly: No Periwound Skin Color: Atrophie Blanche: No N/A N/A Cyanosis: No Ecchymosis: No Erythema: No Hemosiderin Staining: No Gunther, Emiyah B. ([id number]) Mottled: No Pallor: No Rubor: No Temperature: No Abnormality N/A N/A Tenderness on Yes N/A N/A Palpation: Wound Preparation: Ulcer Cleansing: N/A N/A Rinsed/Irrigated with Saline Topical Anesthetic Applied: Other: lidocaine 4% Procedures Performed: Cellular or Tissue Based N/A N/A Product Debridement Treatment Notes Electronic Signature(s) Signed: 11/27/2016 5:12:22 PM By: Linton Ham MD Entered By: Linton Ham on 11/27/2016 14:58:13 [name], [name] ([id number]) -------------------------------------------------------------------------------- Multi-Disciplinary Care Plan Details Patient Name: [name] Date of Service: 11/27/2016 1:30 PM Medical Record Number: [id number] Patient Account Number: [id number] Date of Birth/Sex: 05-27-1941 (76 y.o. Female) Treating RN: Montey Hora Primary Care Shadoe Cryan: Leighton Ruff Other Clinician: Referring Dejha King: Leighton Ruff Treating Khalia Gong/Extender: Tito Dine in Treatment: 14 Active Inactive ` Abuse / Safety / Falls / Self Care Management Nursing Diagnoses: Potential for falls Goals: Patient will remain injury free Date Initiated: 08/21/2016 Target Resolution Date: 10/08/2016 Goal Status: Active Interventions: Assess fall risk on admission and as needed Notes: ` Necrotic Tissue Nursing  Diagnoses: Impaired tissue integrity related to necrotic/devitalized tissue Goals: Necrotic/devitalized tissue will be minimized in the wound bed Date Initiated: 08/21/2016 Target Resolution Date: 10/08/2016 Goal Status: Active Interventions: Assess patient pain level pre-, during and post procedure and prior to discharge Notes: ` Orientation to the Wound Care Program Nursing Diagnoses: Knowledge deficit related to the wound healing center program [name], [name] ([id number]) Goals: Patient/caregiver will verbalize understanding of the [place] Date Initiated: 08/21/2016 Target Resolution Date: 10/08/2016 Goal Status: Active Interventions: Provide education on orientation to the wound center Notes: ` Peripheral Neuropathy Nursing Diagnoses: Potential alteration in peripheral tissue perfusion (select prior to confirmation of diagnosis) Goals: Patient/caregiver will verbalize understanding of disease process and disease management Date Initiated: 08/21/2016 Target Resolution Date: 10/08/2016 Goal Status: Active Interventions: Assess signs and symptoms of neuropathy upon admission and as needed Notes: ` Venous Leg Ulcer Nursing Diagnoses: Actual venous Insuffiency (use after diagnosis is confirmed) Goals: Patient will maintain optimal edema control Date Initiated: 08/21/2016 Target Resolution Date: 10/08/2016 Goal Status: Active Interventions: Assess peripheral edema status every visit. Notes: ` Wound/Skin Impairment Nursing Diagnoses: Impaired tissue integrity [name], [name] B. ([id number]) Goals: Patient/caregiver will verbalize understanding of skin care regimen Date Initiated: 08/21/2016 Target Resolution Date: 10/08/2016 Goal Status:  Active Ulcer/skin breakdown will have a volume reduction of 30% by week 4 Date Initiated: 08/21/2016 Target Resolution Date: 10/08/2016 Goal Status: Active Ulcer/skin breakdown will have a volume reduction of 50%  by week 8 Date Initiated: 08/21/2016 Target Resolution Date: 10/29/2016 Goal Status: Active Ulcer/skin breakdown will have a volume reduction of 80% by week 12 Date Initiated: 08/21/2016 Target Resolution Date: 11/12/2016 Goal Status: Active Ulcer/skin breakdown will heal within 14 weeks Date Initiated: 08/21/2016 Target Resolution Date: 11/12/2016 Goal Status: Active Interventions: Assess patient/caregiver ability to obtain necessary supplies Assess patient/caregiver ability to perform ulcer/skin care regimen upon admission and as needed Assess ulceration(s) every visit Notes: Electronic Signature(s) Signed: 11/27/2016 4:56:33 PM By: Montey Hora Entered By: Montey Hora on 11/27/2016 13:55:50 [name], [name] ([id number]) -------------------------------------------------------------------------------- Pain Assessment Details Patient Name: [name] Date of Service: 11/27/2016 1:30 PM Medical Record Number: [id number] Patient Account Number: [id number] Date of Birth/Sex: [birth year] (76 y.o. Female) Treating RN: Montey Hora Primary Care Jakaya Jacobowitz: Leighton Ruff Other Clinician: Referring Mayci Haning: Leighton Ruff Treating Lorece Keach/Extender: Tito Dine in Treatment: 14 Active Problems Location of Pain Severity and Description of Pain Patient Has Paino No Site Locations Pain Management and Medication Current Pain Management: Notes Topical or injectable lidocaine is offered to patient for acute pain when surgical debridement is performed. If needed, Patient is instructed to use over the counter pain medication for the following 24-48 hours after debridement. Wound care MDs do not prescribed pain medications. Patient has chronic pain or uncontrolled pain. Patient has been instructed to make an appointment with their Primary Care Physician for pain management. Electronic Signature(s) Signed: 11/27/2016 4:56:33 PM By: Montey Hora Entered By: Montey Hora on 11/27/2016 13:37:27 [name], [name] ([id number]) -------------------------------------------------------------------------------- Patient/Caregiver Education Details Patient Name: [name] Date of Service: 11/27/2016 1:30 PM Medical Record Patient Account Number: [id number] Number: Treating RN: Montey Hora August 07, 1941 (76 y.o. Other Clinician: Date of Birth/Gender: Female) Treating ROBSON, MICHAEL Primary Care Physician: Leighton Ruff Physician/Extender: G Referring Physician: Bethanie Dicker in Treatment: 14 Education Assessment Education Provided To: Patient Education Topics Provided Wound/Skin Impairment: Handouts: Other: reportable s/s Methods: Explain/Verbal Responses: State content correctly Electronic Signature(s) Signed: 11/27/2016 4:56:33 PM By: Montey Hora Entered By: Montey Hora on 11/27/2016 15:10:39 [name], [name] ([id number]) -------------------------------------------------------------------------------- Wound Assessment Details Patient Name: [name] Date of Service: 11/27/2016 1:30 PM Medical Record Number: [id number] Patient Account Number: [id number] Date of Birth/Sex: Oct 31, 1940 (76 y.o. Female) Treating RN: Montey Hora Primary Care Anaiyah Anglemyer: Leighton Ruff Other Clinician: Referring Julieanne Hadsall: Leighton Ruff Treating Deziyah Arvin/Extender: Tito Dine in Treatment: 14 Wound Status Wound Number: 2 Primary Diabetic Wound/Ulcer of the Lower Etiology: Extremity Wound Location: Left Calcaneus - Lateral Wound Open Wounding Event: Gradually Appeared Status: Date Acquired: 08/12/2016 Comorbid Anemia, Coronary Artery Disease, Weeks Of Treatment: 14 History: Hypertension, Myocardial Infarction, Clustered Wound: No Type II Diabetes, Osteoarthritis, Neuropathy Photos Photo Uploaded By: Montey Hora on 11/28/2016 13:27:14 Wound Measurements Length: (cm) 1.5 Width: (cm) 2 Depth:  (cm) 0.1 Area: (cm) 2.356 Volume: (cm) 0.236 % Reduction in Area: 67.5% % Reduction in Volume: 83.7% Epithelialization: None Tunneling: No Undermining: No Wound Description Classification: Grade 1 Foul Odor Afte Wound Margin: Flat and Intact Due to Product Exudate Amount: Large Slough/Fibrino Exudate Type: Serous Exudate Color: amber r Cleansing: Yes Use: No Yes Wound Bed Granulation Amount: Medium (34-66%) Exposed Structure Granulation Quality: Red Fascia Exposed: No Necrotic Amount: Medium (34-66%) Fat Layer (Subcutaneous Tissue) Exposed: No [name], [name] B. ([id number]) Necrotic Quality:  Eschar, Adherent Slough Tendon Exposed: No Muscle Exposed: No Joint Exposed: No Bone Exposed: No Limited to Skin Breakdown Periwound Skin Texture Texture Color No Abnormalities Noted: No No Abnormalities Noted: No Callus: No Atrophie Blanche: No Crepitus: No Cyanosis: No Excoriation: No Ecchymosis: No Induration: No Erythema: No Rash: No Hemosiderin Staining: No Scarring: No Mottled: No Pallor: No Moisture Rubor: No No Abnormalities Noted: No Dry / Scaly: No Temperature / Pain Maceration: Yes Temperature: No Abnormality Tenderness on Palpation: Yes Wound Preparation Ulcer Cleansing: Rinsed/Irrigated with Saline Topical Anesthetic Applied: Other: lidocaine 4%, Treatment Notes Wound #2 (Left, Lateral Calcaneus) 1. Cleansed with: Cleanse wound with antibacterial soap and water 2. Anesthetic Topical Lidocaine 4% cream to wound bed prior to debridement 3. Peri-wound Care: Antifungal cream Antifungal powder 4. Dressing Applied: Mepitel Other dressing (specify in notes) Notes kerlix and coban wrap, charcoal - Apligraf applied by Dr Dellia Nims today in clinic, xtrasorb used also [employer]) Signed: 11/27/2016 4:56:33 PM By: Montey Hora Entered By: Montey Hora on 11/27/2016 13:55:38 [name], [name]  ([id number]) -------------------------------------------------------------------------------- [place] Details Patient Name: [name] Date of Service: 11/27/2016 1:30 PM Medical Record Number: [id number] Patient Account Number: [id number] Date of Birth/Sex: 1941/06/22 (76 y.o. Female) Treating RN: Montey Hora Primary Care Somaya Grassi: Leighton Ruff Other Clinician: Referring Sherod Cisse: Leighton Ruff Treating Matalynn [name]/Extender: Tito Dine in Treatment: 14 Vital Signs Time Taken: 13:38 Temperature (F): 98.4 Height (in): 63 Pulse (bpm): 65 Weight (lbs): 205 Respiratory Rate (breaths/min): 18 Body Mass Index (BMI): 36.3 Blood Pressure (mmHg): 138/43 Reference Range: 80 - 120 mg / dl Electronic Signature(s) Signed: 11/27/2016 4:56:33 PM By: Montey Hora Entered By: Montey Hora on 11/27/2016 13:39:35

## 2016-11-29 ENCOUNTER — Other Ambulatory Visit: Payer: Self-pay | Admitting: Pharmacist

## 2016-11-29 NOTE — Patient Outreach (Signed)
[place] [facility]) Care Management  11/29/2016  KOURTNI STINEMAN [birth year] [id number]   Patient called to inquire about Lantus pricing.  HIPAA identifiers were obtained.  Patient stated she went to the pharmacy was told her Lantus would be $280.00.  Patient was informed she is most likely in the coverage gap.  Representative at [facility] has been contacted to review patient's current coverage and TROOP (true out of pocket) spend.    After brief financial conversation today, it looks like the patient may be eligible to get insulin from prescription assistance programs.  Sanofi (Lantus) requires patients to spend 5% of their income on medications.  [place] requires patients to spend [employer] requires patients to spend $1000  (One the [place] from [employer] is received, we will know how to proceed.)  Patient has a home visit with [place] Practitioner, Bradd Canary next week.  Patient Assistance forms were given to [facility] for her to take to the patient for signature during the visit.  Plan:  1.  Follow up with the patient next week.  2.  Reach out to [facility], [place] about [place] the patient's insulin until we can get her approved for patient assistance.  3.  Route note to the Provider after a decision has been made about which PAP program we will use.  4.  Coordinate with Arbie Cookey.  [name], PharmD, [place] Clinical Pharmacist [phone]

## 2016-12-02 ENCOUNTER — Other Ambulatory Visit: Payer: Self-pay | Admitting: Pharmacist

## 2016-12-02 NOTE — Patient Outreach (Signed)
[place] [facility]) Care Management  12/02/2016  [name] [birth year] [id number]   Patient was called regarding medication assistance.  HIPAA identifiers were obtained.  Medicare Part D "coverage gap" education was provided to the patient.  She was also informed patient assistance applications were given to [place] Practitioner, [name] for signature.  Patient does not meet the out-of-pocket spend for any of the programs that provide Insulin.  (Avg is $1000..patient is at $240 per [employer]).  After speaking with [facility] Assistant Director, patient's Lantus will be purchased with [facility] funding.  [place] quoted the price for 2 boxes of Lantus Solostar as $280.40 (on the [employer]).   Applications for [employer], [employer]ofi and [employer] (Xarelto) were given to [employer] to give to the patient during her upcoming visit 12/03/16.  In addition, the patient was encouraged to contact her provider about Eliquis samples. (She had a 90 day supply of Eliquis filled 09/19/16)  Plan:  1. Continue to coordinate [facility] funding to pay for the patient's Lantus prescription.  2. Follow up with patient by the end of the week.  [name], PharmD, [place] Clinical Pharmacist [phone]

## 2016-12-03 ENCOUNTER — Other Ambulatory Visit: Payer: Self-pay | Admitting: *Deleted

## 2016-12-03 ENCOUNTER — Other Ambulatory Visit: Payer: Self-pay | Admitting: Pharmacist

## 2016-12-03 ENCOUNTER — Encounter: Payer: Self-pay | Admitting: *Deleted

## 2016-12-03 ENCOUNTER — Other Ambulatory Visit: Payer: Self-pay

## 2016-12-03 NOTE — Patient Outreach (Signed)
[place] [facility]) Care Management  12/03/2016  [name] 12/25/1940 [id number]  Routine home visit. Pt is finally over her URI without needing antibiotic treatment. Her glucose levels were elevated during this illness. Prior to the illness she had a majority of her readings were below 200 mg/DL. She reports she is still going to the wound clinic and she will go tomorrow. They will not evaluate her skin graft site until next week. [name] daughter has also been sick requiring hospitalization.  O:  BP (!) 162/62 (BP Location: Right Arm, Patient Position: Sitting, Cuff Size: Normal)   Pulse 67   Resp 16   Wt 206 lb (93.4 kg) Comment: at last MD appt.  SpO2 99%   BMI 36.49 kg/m  FBS 163 today.  Heart: RRR Lungs are clear Extremities:  RBKA, LLE wrapped in an unnaboot. Appears to be minimal edema.  A:  Diabetes      Resolved URI  P:  Pt signed pharmacy assistance forms provided by our [facility] Pharmacist.      Discussed glucose goals. Reviewed carbs and serving amounts.      I will see pt again in one month. I have encouraged her to call me for any health issues.  [name] [facility] [phone]

## 2016-12-03 NOTE — Patient Outreach (Signed)
[facility]) Care Management  [place]   12/03/2016  AIMEE HELDMAN 1940/10/23 [id number]  Subjective: Ms. Gal is a 76 year female who I saw today to deliver her Lantus insulin.  She is unable to afford her Lantus due to the high cost and her limited income.  She has a wound that is healing slowly.  Due to her high risk of hospitalization, she qualifies for the Emergency Pharmacy Fund.  During my visit, I determined she also needed assistance with transportation and she did not have an adequate ramp to leave her house.  She is mostly wheelchair bound.  Her ramp is very steep and does not have any rails on the side to help her go down it.  She relies on others to get her to her appointments.  She has to call the front offices of providers for them to provide a wheelchair for her to use once she gets there.  I am also picking up patient assistance forms she has signed to help her get her medications.   Objective:   Encounter Medications: Outpatient Encounter Prescriptions as of 12/03/2016  Medication Sig Note  . acetaminophen (TYLENOL) 500 MG tablet Take 1,000 mg by mouth every 6 (six) hours as needed for mild pain.  08/23/2016: PRN ONLY  . amiodarone (PACERONE) 200 MG tablet TAKE ONE TABLET BY MOUTH DAILY **MUST CALL MD FOR APPOINTMENT   . apixaban (ELIQUIS) 5 MG TABS tablet Take 1 tablet (5 mg total) by mouth 2 (two) times daily.   Marland Kitchen atorvastatin (LIPITOR) 80 MG tablet TAKE 1 TABLET (80 MG TOTAL) BY MOUTH EVERY EVENING.   . calcitRIOL (ROCALTROL) 0.25 MCG capsule Take 1 capsule by mouth daily.   . collagenase (SANTYL) ointment Apply 1 application topically daily.   . furosemide (LASIX) 40 MG tablet Take 80 mg by mouth 2 (two) times daily. Takes all doses as often as she can.    . insulin glargine (LANTUS) 100 UNIT/ML injection Inject 0.3 mLs (30 Units total) into the skin 2 (two) times daily. 55 units in the morning and 60 units at night (Patient taking differently:  Inject 40 Units into the skin 2 (two) times daily. ) 11/29/2016: Patient said she is using 45 units twice daily  . isosorbide mononitrate (IMDUR) 30 MG 24 hr tablet TAKE ONE TABLET BY MOUTH DAILY (CALL [phone], TO MAKE AN APPOINTMENT FOR FURTHER REFILLS)   . Magnesium 400 MG TABS Take 400 mg by mouth daily.    . Melatonin 10 MG TABS Take 10 mg by mouth at bedtime.  10/01/2016: Has not taken in the last month  . metoprolol succinate (TOPROL-XL) 100 MG 24 hr tablet TAKE 1 TABLET (100 MG TOTAL) BY MOUTH EVERY EVENING. TAKE WITH OR IMMEDIATELY FOLLOWING A MEAL.   . Multiple Vitamins-Minerals (PRESERVISION AREDS PO) Take 1 capsule by mouth 2 (two) times daily.   . nitroGLYCERIN (NITROSTAT) 0.6 MG SL tablet Take 0.6 mg by mouth every 5 (five) minutes x 3 doses as needed.   Marland Kitchen NOVOLOG FLEXPEN 100 UNIT/ML FlexPen Use per sliding scale 10/04/2016: I have given pt this sliding scale: 151-200 - 5 units, 201-250 - 10 units, 251 - 300 - 15 units and >301 20 units.  Marland Kitchen omeprazole (PRILOSEC) 20 MG capsule Take 20 mg by mouth every evening.    . polyethylene glycol (MIRALAX / GLYCOLAX) packet Take 17 g by mouth as needed. 08/23/2016: ONLY USES PRN  . potassium chloride SA (K-DUR,KLOR-CON) 20 MEQ tablet  Take 20 mEq by mouth daily.    Marland Kitchen senna (SENOKOT) 8.6 MG TABS tablet Take 4 tablets by mouth at bedtime as needed (([employer])). Takes 1 tab daily and can take addt'l tab as needed for constipation   . sodium bicarbonate 650 MG tablet Take 1,300 mg by mouth 2 (two) times daily.    No facility-administered encounter medications on file as of 12/03/2016.     Functional Status: In your present state of health, do you have any difficulty performing the following activities: 08/16/2016  Hearing? N  Vision? Y  Difficulty concentrating or making decisions? N  Walking or climbing stairs? Y  Dressing or bathing? Y  Doing errands, shopping? Y  Preparing Food and eating ? N  Using the Toilet? N  In the past six  months, have you accidently leaked urine? N  Do you have problems with loss of bowel control? Y  Managing your Medications? N  Managing your Finances? N  Housekeeping or managing your Housekeeping? Y  Some recent data might be hidden    Fall/Depression Screening: Fall Risk  08/16/2016  Falls in the past year? Yes  Number falls in past yr: 1  Injury with Fall? No  Risk for fall due to : History of fall(s);Impaired balance/gait  Risk for fall due to (comments): RBKA.  Follow up Falls evaluation completed;Education provided;Falls prevention discussed   PHQ 2/9 Scores 08/16/2016  PHQ - 2 Score 4  PHQ- 9 Score 8   Assessment: Medication assistance:  She has her Lantus to help her remain adherent and to help her get at goal with her blood sugars.  She has Eliquis but may need [place] to purchase some in the future.    Transportation:  She will need wheelchair accessible transportation assistance.  She relies on others to get her to appointments.  She is not always able to get people to help her and she will drive herself, which is not ideal.    Ramp assessment:  She has a ramp in her house that is very steep and does not have railings on it.  She has to use her feet to slow herself down when going down it. I am concerned that she will fall out of her wheelchair using it.    Plan: 1.  I was able to purchase a 30 day supply of the Lantus using the Emergency Pharmacy Fund.  I will continue to use this fund with her until she reaches the amount to allow [place] to apply for assistance with the patient assistance programs.  The fund will also be used for Eliquis when she is in need of it.   2.  I will refer her to social work for transportation and ramp assessment.    3.  I picked up the patient assistance forms and will give to Denyse Amass, PharmD for her to complete as well.   4.  Alwyn Ren will follow up with her on a routine basis.    Deanne Coffer, PharmD, [employer] [phone]

## 2016-12-03 NOTE — Patient Outreach (Signed)
[facility]) Care Management  12/03/2016  [name] 1941/08/11 [id number]  Patient was called regarding medication assistance.  HIPAA identifiers were obtained.  [facility] funding has been confirmed to pay for the patient's medication.  [facility] Assistant Director, [employer] will pick up the patient's medication and deliver it to her home.  It was confirmed with the patient that she will be home this afternoon for delivery.  [name], PharmD, [place] Clinical Pharmacist [phone]

## 2016-12-04 ENCOUNTER — Other Ambulatory Visit: Payer: Self-pay | Admitting: Licensed Clinical Social Worker

## 2016-12-04 ENCOUNTER — Other Ambulatory Visit: Payer: Self-pay | Admitting: *Deleted

## 2016-12-04 ENCOUNTER — Encounter: Payer: Self-pay | Admitting: Licensed Clinical Social Worker

## 2016-12-04 DIAGNOSIS — E11621 Type 2 diabetes mellitus with foot ulcer: Secondary | ICD-10-CM | POA: Diagnosis not present

## 2016-12-04 NOTE — Patient Outreach (Signed)
[facility]) Care Management  12/04/2016  CARIANN KINNAMON 1941/06/01 [id number]   Assessment-CSW completed initial outreach attempt today after receiving new referral for transportation. CSW unable to reach patient successfully. CSW left a HIPPA compliant voice message encouraging patient to return call once available.  Plan-CSW will await return call or complete an additional outreach if needed.  Eula Fried, BSW, MSW, [email or web address] Phone: [phone] Fax: [phone]

## 2016-12-04 NOTE — Patient Outreach (Signed)
[facility]) Care Management  12/04/2016  [name] 1940/10/06 [id number]  Assessment- CSW received incoming call from patient on 12/04/16. HIPPA verifications were provided. CSW introduced self, reason for call and of [facility] social work services. Patient is agreeable to social work assistance. Patient is a very pleasant throughout entire phone call. Patient reports that she has a below the knee amputation and that she is mostly wheelchair bound. Patient shares that she has a ramp but that it is very unsafe. She states that her ramp is very steep and has no railings. Patient reports that she does own her home. CSW educated her on ramp assistance programs: [employer] and [employer]. Patient is agreeable to CSW assisting her with gaining a new ramp. Patient also has transportation issues. CSW reviewed transportation resources over the phone and patient wishes to gain [employer] services which accomodates wheelchair bound clients. CSW scheduled home visit for 12/06/16.  Plan-CSW will send involvement letter to PCP and open case. CSW will complete home visit on 12/06/16.  Eula Fried, BSW, MSW, [email or web address] Phone: [phone] Fax: [phone]

## 2016-12-05 ENCOUNTER — Encounter: Payer: Self-pay | Admitting: Cardiology

## 2016-12-05 ENCOUNTER — Ambulatory Visit (INDEPENDENT_AMBULATORY_CARE_PROVIDER_SITE_OTHER): Payer: PPO | Admitting: Cardiology

## 2016-12-05 VITALS — BP 130/62 | HR 64 | Ht 63.0 in | Wt 200.0 lb

## 2016-12-05 DIAGNOSIS — E1151 Type 2 diabetes mellitus with diabetic peripheral angiopathy without gangrene: Secondary | ICD-10-CM

## 2016-12-05 DIAGNOSIS — I480 Paroxysmal atrial fibrillation: Secondary | ICD-10-CM

## 2016-12-05 DIAGNOSIS — Z89511 Acquired absence of right leg below knee: Secondary | ICD-10-CM | POA: Diagnosis not present

## 2016-12-05 NOTE — Progress Notes (Signed)
Cardiology Office Note    Date:  12/05/2016   ID:  [name], DOB 12-21-1940, MRN [id number]  PCP:  Gerrit Heck, MD  Cardiologist:   Candee Furbish, MD     History of Present Illness:  [name] is a 76 y.o. female with coronary artery disease status post myocardial infarction in January 2014 following bladder surgery at [facility], atrial fibrillation, uncontrolled diabetes, obtuse marginal stent, hypertension, hyperlipidemia, PVD, right BKA, chronic kidney disease stage 3-4, urology Dr. Tresa Moore.  She was battling with hematuria from her chronic urostomy bag. She saw Dr. Tresa Moore on 04/09/16. He reassured her that a small amount of blood can cause significant redness to her bag. He would only be concerned or more concerned if there was thickness in the bag i.e. coagulated blood.  She was having trouble every time she took the lower dose Xarelto 15 mg.  Prior hospitalizations 01/26/13 with chest pain, radiation to left arm with shortness of breath. Troponin was normal, EKG showed subtle ST segment depression in inferior leads. Echocardiogram showed normal EF, nuclear stress test showed no definitive evidence of prior infarction or ischemia. Attenuation artifact noted. Large area of attenuation involving the lateral wall from prior infarct. Medical management.  She has been battling ulcerative wounds on her left foot. Prior right BKA. Prior skin grafting.  12/05/16-currently taking Eliquis. Doing well. Rarely will she see blood in her urostomy bag. If she does, she holds her Eliquis for 1 day. She is still with shortness of breath which seems to be progressively getting worse. She is progressively unable to get out of her wheelchair. Deconditioning. No chest pain. No syncope.   Past Medical History:  Diagnosis Date  . Anemia   . Blood transfusion   . CAD (coronary artery disease) 01/26/2013   Evidently circumflex stent in 2014 following bladder cancer surgery for old myocardial  infarction.   . CKD (chronic kidney disease) stage 3, GFR 30-59 ml/min 01/26/2013  . GERD (gastroesophageal reflux disease)   . Glaucoma   . History of atrial tachycardia   . History of bladder cancer TCC AND CIS  . Hyperlipidemia   . Hypertension   . Macular degeneration of both eyes   . Myocardial infarction ([place])   . Obesity (BMI 30-39.9)   . Peripheral neuropathy LEGS AND HANDS  . Peripheral vascular disease (HCC) S/P RIGHT BKA  . Presence of urostomy ([place])   . Retinopathy due to secondary diabetes mellitus ([place])   . 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 Type 2 diabetes mellitus with vascular disease [facility])     Past Surgical History:  Procedure Laterality Date  . ABDOMINAL ANGIOGRAM N/A 09/08/2014   Procedure: ABDOMINAL ANGIOGRAM;  Surgeon: Conrad Velva, MD;  [facility] C[facility] LAB;  Service: Cardiovascular;  Laterality: N/A;  . ABDOMINAL HYSTERECTOMY    . AMPUTATION Left 09/14/2014   Procedure: LEFT FOOT FIRST RAY AMPUTATION;  Surgeon: Mcarthur Rossetti, MD;  Location: [place];  Service: Orthopedics;  Laterality: Left;  . APPENDECTOMY  1963  . [place]  . BELOW KNEE LEG AMPUTATION  05-02-2007   RIGHT  . CATARACT EXTRACTION W/ INTRAOCULAR LENS  IMPLANT, BILATERAL    . CORONARY STENT PLACEMENT    . CYSTO / RESECTION BLADDER BX'S  04-01-11  &  11-26-10  . CYSTOSCOPY WITH BIOPSY  10/14/2011   Procedure: CYSTOSCOPY WITH BIOPSY;  Surgeon: Claybon Jabs, MD;  Location: [facility];  Service: Urology;  Laterality: N/A;  BLADDER BIOPSY  . FEMORAL-POPLITEAL BYPASS GRAFT Left 09/12/2014   Procedure:  LEFT FEMORAL-POPLITEAL ARTERY BYPASS GRAFT USING NON REVERSE LEFT GREATER SAPHENOUS VEIN;  Surgeon: Rosetta Posner, MD;  Location: [place];  Service: Vascular;  Laterality: Left;  . I & D RIGHT BELOW KNEE AMPUTATION WOUND  06-10-2011  . LAPAROSCOPIC CHOLECYSTECTOMY  12-10-1999  . REVISION UROSTOMY CUTANEOUS    . REVISION UROSTOMY CUTANEOUS      . RIGHT FOOT I & D / REMOVAL NECROTIC BONE  JUN  &  AUG 2008  . RIGHT GREAT TOE AMPUTATION  11-05-2006    Current Medications: Outpatient Medications Prior to Visit  Medication Sig Dispense Refill  . acetaminophen (TYLENOL) 500 MG tablet Take 1,000 mg by mouth every 6 (six) hours as needed for mild pain.     Marland Kitchen amiodarone (PACERONE) 200 MG tablet TAKE ONE TABLET BY MOUTH DAILY **MUST CALL MD FOR APPOINTMENT 15 tablet 0  . apixaban (ELIQUIS) 5 MG TABS tablet Take 1 tablet (5 mg total) by mouth 2 (two) times daily. 60 tablet 11  . atorvastatin (LIPITOR) 80 MG tablet TAKE 1 TABLET (80 MG TOTAL) BY MOUTH EVERY EVENING. 90 tablet 3  . calcitRIOL (ROCALTROL) 0.25 MCG capsule Take 1 capsule by mouth daily.    . collagenase (SANTYL) ointment Apply 1 application topically daily.    . furosemide (LASIX) 40 MG tablet Take 80 mg by mouth 2 (two) times daily. Takes all doses as often as she can.     . insulin glargine (LANTUS) 100 UNIT/ML injection Inject 0.3 mLs (30 Units total) into the skin 2 (two) times daily. 55 units in the morning and 60 units at night (Patient taking differently: Inject 30 Units into the skin 2 (two) times daily. Sliding scale) 10 mL 11  . isosorbide mononitrate (IMDUR) 30 MG 24 hr tablet TAKE ONE TABLET BY MOUTH DAILY (CALL [phone], TO MAKE AN APPOINTMENT FOR FURTHER REFILLS) 30 tablet 1  . Magnesium 400 MG TABS Take 400 mg by mouth daily.     . Melatonin 10 MG TABS Take 10 mg by mouth at bedtime.     . metoprolol succinate (TOPROL-XL) 100 MG 24 hr tablet TAKE 1 TABLET (100 MG TOTAL) BY MOUTH EVERY EVENING. TAKE WITH OR IMMEDIATELY FOLLOWING A MEAL. 90 tablet 2  . Multiple Vitamins-Minerals (PRESERVISION AREDS PO) Take 1 capsule by mouth 2 (two) times daily.    . nitroGLYCERIN (NITROSTAT) 0.6 MG SL tablet Take 0.6 mg by mouth every 5 (five) minutes x 3 doses as needed.    Marland Kitchen NOVOLOG FLEXPEN 100 UNIT/ML FlexPen Use per sliding scale    . omeprazole (PRILOSEC) 20 MG capsule Take  20 mg by mouth every evening.     . polyethylene glycol (MIRALAX / GLYCOLAX) packet Take 17 g by mouth as needed.    . potassium chloride SA (K-DUR,KLOR-CON) 20 MEQ tablet Take 20 mEq by mouth daily.     Marland Kitchen senna (SENOKOT) 8.6 MG TABS tablet Take 4 tablets by mouth at bedtime as needed (([employer])). Takes 1 tab daily and can take addt'l tab as needed for constipation    . sodium bicarbonate 650 MG tablet Take 1,300 mg by mouth 2 (two) times daily.     No facility-administered medications prior to visit.      Allergies:   Gabapentin; Lisinopril; Metformin and related; and Biaxin [clarithromycin]   Social History   Social History  .  Marital status: Divorced    Spouse name: N/A  . Number of children: N/A  . Years of education: N/A   Social History Main Topics  . Smoking status: Former Smoker    Packs/day: 2.00    Years: 17.00    Types: Cigarettes    Quit date: 10/09/1975  . Smokeless tobacco: Never Used  . Alcohol use 0.0 oz/week     Comment: RARE  . Drug use: No  . Sexual activity: Not Asked   Other Topics Concern  . None   Social History Narrative  . None     Family History:  The patient's family history includes AAA (abdominal aortic aneurysm) in her brother; Diabetes in her brother; Heart attack in her brother and father; Heart disease in her brother and father; Heart failure in her mother; Hypertension in her father; Sudden death (age of onset: 80) in her father.   ROS:   Please see the history of present illness.    ROS All other systems reviewed and are negative.   PHYSICAL EXAM:   VS:  BP 130/62   Pulse 64   Ht 5\' 3"  (1.6 m)   Wt 200 lb (90.7 kg)   BMI 35.43 kg/m    GEN: Well nourished, well developed, in no acute distress In wheelchair HEENT: normal  Neck: no JVD, carotid bruits, or masses Cardiac: RRR; no murmurs, rubs, or gallops,no edema  Respiratory:  R lung base wheeze, normal work of breathing GI: soft, nontender, nondistended, + BS  overweight, obese MS: no deformity or atrophy , right BKA Skin: warm and dry, no rash Neuro:  Alert and Oriented x 3, Strength and sensation are intact Psych: euthymic mood, full affect  Wt Readings from Last 3 Encounters:  12/05/16 200 lb (90.7 kg)  12/03/16 206 lb (93.4 kg)  10/17/16 206 lb (93.4 kg)      Studies/Labs Reviewed:   EKG:  12/05/16-sinus rhythm nonspecific ST-T wave changes heart rate 64 bpm   Recent Labs: No results found for requested labs within last 8760 hours.   Lipid Panel No results found for: CHOL, TRIG, HDL, CHOLHDL, VLDL, LDLCALC, LDLDIRECT  Additional studies/ records that were reviewed today include:  Prior office notes, laboratory, testing reviewed    ASSESSMENT:    1. Paroxysmal atrial fibrillation (HCC)   2. Hx of right BKA ([place])   3. Diabetes mellitus with peripheral vascular disease (HCC)      PLAN:  In order of problems listed above:  Atrial fibrillation, parox  - Currently in sinus rhythm. Excellent.  - Bleeding complication from Xarelto 15 mg into urostomy bag.  - Dr. Tresa Moore has evaluated. Reassurance.  - Eliquis 5 mg twice a day.   - May skip a day when she sees blood in bag.   Chronic anticoagulation  - Doing well currently with Eliquis.  Coronary artery disease  - Obtuse marginal stent 2014 after MI  History bladder cancer  - Dr. Tresa Moore is now watching. Stable.  Shortness of breath  - Mostly deconditioning, is very hard for her to walk at this point. Right BKA, left hip arthritis. Prior stress test as above. Normal EF.  Amputated right leg  - No changes.   Medication Adjustments/Labs and Tests Ordered: Current medicines are reviewed at length with the patient today.  Concerns regarding medicines are outlined above.  Medication changes, Labs and Tests ordered today are listed in the Patient Instructions below. Patient Instructions  Medication Instructions:  The current medical regimen is  effective;  continue present  plan and medications.  Follow-Up: Follow up in 6 months with Dr. Marlou Porch.  You will receive a letter in the mail 2 months before you are due.  Please call us when you receive this letter to schedule your follow up appointment.  If you need a refill on your cardiac medications before your next appointment, please call your pharmacy.  Thank you for choosing [facility]!!        Signed, Candee Furbish, MD  12/05/2016 4:51 PM    [address] [place], [place], [place]  [address] Phone: [phone]; Fax: [phone]

## 2016-12-05 NOTE — Progress Notes (Signed)
MAKYNNA, MANOCCHIO ([id number]) Visit Report for 12/04/2016 Arrival Information Details Patient Name: [name], [name] Date of Service: 12/04/2016 1:15 PM Medical Record Patient Account Number: [id number] Number: Treating RN: Baruch Gouty, RN, BSN, Rita [birth year] (76 y.o. Other Clinician: Date of Birth/Sex: Female) Treating ROBSON, [place] [facility] Laprecious Austill: Leighton Ruff Lannis Lichtenwalner/Extender: G Referring Lacinda Curvin: Bethanie Dicker in Treatment: 15 Visit Information History Since Last Visit All ordered tests and consults were completed: No Patient Arrived: Wheel Chair Added or deleted any medications: No Arrival Time: 12:44 Any new allergies or adverse reactions: No Accompanied By: dtr Had a fall or experienced change in No Transfer Assistance: None activities of daily living that may affect Patient Identification Verified: Yes risk of falls: Secondary Verification Process Yes Signs or symptoms of abuse/neglect since last No Completed: visito Patient Has Alerts: Yes Hospitalized since last visit: No Patient Alerts: Patient on Blood Has Dressing in Place as Prescribed: Yes Thinner Has Compression in Place as Prescribed: Yes eliquis DMII Pain Present Now: No ABI [place] >220 Electronic Signature(s) Signed: 12/04/2016 4:26:20 PM By: Regan Lemming BSN, RN Entered By: Regan Lemming on 12/04/2016 12:44:46 Garrow, [name] ([id number]) -------------------------------------------------------------------------------- Clinic Level of Care Assessment Details Patient Name: [name] Date of Service: 12/04/2016 1:15 PM Medical Record Patient Account Number: [id number] Number: Treating RN: Baruch Gouty, RN, BSN, Rita 1940/09/25 (76 y.o. Other Clinician: Date of Birth/Sex: Female) Treating ROBSON, [place] [facility] Charlise Giovanetti: Leighton Ruff Mylea Roarty/Extender: G Referring Benicio Manna: Bethanie Dicker in Treatment: 15 Clinic Level of Care Assessment  Items TOOL 4 Quantity Score []  - Use when only an EandM is performed on FOLLOW-UP visit 0 ASSESSMENTS - Nursing Assessment / Reassessment X - Reassessment of Co-morbidities (includes updates in patient status) 1 10 X - Reassessment of Adherence to Treatment Plan 1 5 ASSESSMENTS - Wound and Skin Assessment / Reassessment X - Simple Wound Assessment / Reassessment - one wound 1 5 []  - Complex Wound Assessment / Reassessment - multiple wounds 0 []  - Dermatologic / Skin Assessment (not related to wound area) 0 ASSESSMENTS - Focused Assessment []  - Circumferential Edema Measurements - multi extremities 0 []  - Nutritional Assessment / Counseling / Intervention 0 []  - Lower Extremity Assessment (monofilament, tuning fork, pulses) 0 []  - Peripheral Arterial Disease Assessment (using hand held doppler) 0 ASSESSMENTS - Ostomy and/or Continence Assessment and Care []  - Incontinence Assessment and Management 0 []  - Ostomy Care Assessment and Management (repouching, etc.) 0 PROCESS - Coordination of Care X - Simple Patient / Family Education for ongoing care 1 15 []  - Complex (extensive) Patient / Family Education for ongoing care 0 []  - Staff obtains Programmer, systems, Records, Test Results / Process Orders 0 []  - Staff telephones HHA, Nursing Homes / Clarify orders / etc 0 Faux, Antrice B. ([id number]) []  - Routine Transfer to another Facility (non-emergent condition) 0 []  - Routine Hospital Admission (non-emergent condition) 0 []  - New Admissions / Biomedical engineer / Ordering NPWT, Apligraf, etc. 0 []  - Emergency Hospital Admission (emergent condition) 0 []  - Simple Discharge Coordination 0 []  - Complex (extensive) Discharge Coordination 0 PROCESS - Special Needs []  - Pediatric / Minor Patient Management 0 []  - Isolation Patient Management 0 []  - Hearing / Language / Visual special needs 0 []  - Assessment of Community assistance (transportation, D/C planning, etc.) 0 []  - Additional  assistance / Altered mentation 0 []  - Support Surface(s) Assessment (bed, cushion, seat, etc.) 0 INTERVENTIONS - Wound Cleansing / Measurement X - Simple Wound Cleansing -  one wound 1 5 []  - Complex Wound Cleansing - multiple wounds 0 []  - Wound Imaging (photographs - any number of wounds) 0 []  - Wound Tracing (instead of photographs) 0 []  - Simple Wound Measurement - one wound 0 []  - Complex Wound Measurement - multiple wounds 0 INTERVENTIONS - Wound Dressings X - Small Wound Dressing one or multiple wounds 1 10 []  - Medium Wound Dressing one or multiple wounds 0 []  - Large Wound Dressing one or multiple wounds 0 []  - Application of Medications - topical 0 []  - Application of Medications - injection 0 Lavell, Johnie B. ([id number]) INTERVENTIONS - Miscellaneous []  - External ear exam 0 []  - Specimen Collection (cultures, biopsies, blood, body fluids, etc.) 0 []  - Specimen(s) / Culture(s) sent or taken to Lab for analysis 0 []  - Patient Transfer (multiple staff / Harrel Lemon Lift / Similar devices) 0 []  - Simple Staple / Suture removal (25 or less) 0 []  - Complex Staple / Suture removal (26 or more) 0 []  - Hypo / Hyperglycemic Management (close monitor of Blood Glucose) 0 []  - Ankle / Brachial Index (ABI) - do not check if billed separately 0 []  - Vital Signs 0 Has the patient been seen at the hospital within the last three years: Yes Total Score: 50 Level Of Care: New/Established - Level 2 Electronic Signature(s) Signed: 12/04/2016 4:26:20 PM By: Regan Lemming BSN, RN Entered By: Regan Lemming on 12/04/2016 13:09:13 Birkel, [name] ([id number]) -------------------------------------------------------------------------------- Encounter Discharge Information Details Patient Name: [name] Date of Service: 12/04/2016 1:15 PM Medical Record Patient Account Number: [id number] Number: Treating RN: Baruch Gouty, RN, BSN, Rita 1940/11/24 (76 y.o. Other Clinician: Date of  Birth/Sex: Female) Treating ROBSON, MICHAEL Primary Care Shala Baumbach: Leighton Ruff Cace Osorto/Extender: G Referring Marqus Macphee: Bethanie Dicker in Treatment: 15 Encounter Discharge Information Items Discharge Pain Level: 0 Discharge Condition: Stable Ambulatory Status: Walker Discharge Destination: Home Private Transportation: Auto Accompanied By: sister Schedule Follow-up Appointment: No Medication Reconciliation completed and No provided to Patient/Care Letonya Mangels: Clinical Summary of Care: Electronic Signature(s) Signed: 12/04/2016 4:26:20 PM By: Regan Lemming BSN, RN Entered By: Regan Lemming on 12/04/2016 13:08:37 [name] ([id number]) -------------------------------------------------------------------------------- Patient/Caregiver Education Details Patient Name: [name] Date of Service: 12/04/2016 1:15 PM Medical Record Patient Account Number: [id number] Number: Treating RN: Baruch Gouty, RN, BSN, Rita [birth year] (76 y.o. Other Clinician: Date of Birth/Gender: Female) Treating ROBSON, MICHAEL Primary Care Physician: Leighton Ruff Physician/Extender: G Referring Physician: Bethanie Dicker in Treatment: 15 Education Assessment Education Provided To: Patient Education Topics Provided Welcome To [facility]: Methods: Explain/Verbal Responses: State content correctly Electronic Signature(s) Signed: 12/04/2016 4:26:20 PM By: Regan Lemming BSN, RN Entered By: Regan Lemming on 12/04/2016 33:83:29

## 2016-12-05 NOTE — Patient Instructions (Signed)

## 2016-12-06 ENCOUNTER — Other Ambulatory Visit: Payer: Self-pay | Admitting: Licensed Clinical Social Worker

## 2016-12-06 ENCOUNTER — Ambulatory Visit: Payer: PPO | Admitting: Pharmacist

## 2016-12-06 NOTE — Patient Outreach (Addendum)
[facility]) Care Management  [facility] Social Work  12/06/2016  MELVENA VINK 03-22-1941 [id number]  Encounter Medications:  Outpatient Encounter Prescriptions as of 12/06/2016  Medication Sig Note  . acetaminophen (TYLENOL) 500 MG tablet Take 1,000 mg by mouth every 6 (six) hours as needed for mild pain.  08/23/2016: PRN ONLY  . amiodarone (PACERONE) 200 MG tablet TAKE ONE TABLET BY MOUTH DAILY **MUST CALL MD FOR APPOINTMENT   . apixaban (ELIQUIS) 5 MG TABS tablet Take 1 tablet (5 mg total) by mouth 2 (two) times daily.   Marland Kitchen atorvastatin (LIPITOR) 80 MG tablet TAKE 1 TABLET (80 MG TOTAL) BY MOUTH EVERY EVENING.   . calcitRIOL (ROCALTROL) 0.25 MCG capsule Take 1 capsule by mouth daily.   . collagenase (SANTYL) ointment Apply 1 application topically daily.   . furosemide (LASIX) 40 MG tablet Take 80 mg by mouth 2 (two) times daily. Takes all doses as often as she can.    . insulin glargine (LANTUS) 100 UNIT/ML injection Inject 0.3 mLs (30 Units total) into the skin 2 (two) times daily. 55 units in the morning and 60 units at night (Patient taking differently: Inject 30 Units into the skin 2 (two) times daily. Sliding scale) 11/29/2016: Patient said she is using 45 units twice daily  . isosorbide mononitrate (IMDUR) 30 MG 24 hr tablet TAKE ONE TABLET BY MOUTH DAILY (CALL [phone], TO MAKE AN APPOINTMENT FOR FURTHER REFILLS)   . Magnesium 400 MG TABS Take 400 mg by mouth daily.    . Melatonin 10 MG TABS Take 10 mg by mouth at bedtime.  10/01/2016: Has not taken in the last month  . metoprolol succinate (TOPROL-XL) 100 MG 24 hr tablet TAKE 1 TABLET (100 MG TOTAL) BY MOUTH EVERY EVENING. TAKE WITH OR IMMEDIATELY FOLLOWING A MEAL.   . Multiple Vitamins-Minerals (PRESERVISION AREDS PO) Take 1 capsule by mouth 2 (two) times daily.   . nitroGLYCERIN (NITROSTAT) 0.6 MG SL tablet Take 0.6 mg by mouth every 5 (five) minutes x 3 doses as needed.   Marland Kitchen NOVOLOG FLEXPEN 100 UNIT/ML FlexPen Use  per sliding scale 10/04/2016: I have given pt this sliding scale: 151-200 - 5 units, 201-250 - 10 units, 251 - 300 - 15 units and >301 20 units.  Marland Kitchen omeprazole (PRILOSEC) 20 MG capsule Take 20 mg by mouth every evening.    . polyethylene glycol (MIRALAX / GLYCOLAX) packet Take 17 g by mouth as needed. 08/23/2016: ONLY USES PRN  . potassium chloride SA (K-DUR,KLOR-CON) 20 MEQ tablet Take 20 mEq by mouth daily.    Marland Kitchen senna (SENOKOT) 8.6 MG TABS tablet Take 4 tablets by mouth at bedtime as needed (([employer])). Takes 1 tab daily and can take addt'l tab as needed for constipation   . sodium bicarbonate 650 MG tablet Take 1,300 mg by mouth 2 (two) times daily.    No facility-administered encounter medications on file as of 12/06/2016.     Functional Status:  In your present state of health, do you have any difficulty performing the following activities: 12/06/2016 08/16/2016  Hearing? N N  Vision? Y Y  Difficulty concentrating or making decisions? N N  Walking or climbing stairs? Y Y  Dressing or bathing? Y Y  Doing errands, shopping? Y Y  Conservation officer, nature and eating ? - N  Using the Toilet? - N  In the past six months, have you accidently leaked urine? - N  Do you have problems with loss of bowel control? -  Y  Managing your Medications? - N  Managing your Finances? - N  Housekeeping or managing your Housekeeping? - Y  Some recent data might be hidden    Fall/Depression Screening:  PHQ 2/9 Scores 12/06/2016 08/16/2016  PHQ - 2 Score 1 4  PHQ- 9 Score - 8    Assessment: CSW completed initial home visit on 12/06/16 at patient's residence with patient and daughter Horris Latino present. 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 Patient showed CSW her ramp and it is very unsafe for her to use. CSW contacted [employer] and made referral to program. Patient's residence is in their approved area and they are able to  assist patient with gaining a new ramp. [name] from [employer] agreed to contact patient within the next few hours to complete intake form. CSW provided education on program to patient as well as handed her a hard copy of information for her to keep.   CSW provided patient with transportation resource list to keep and patient has decided she would like to gain [facility] services. Patient's daughter is also interested in gaining [employer]AT services and CSW provided daughter with an extra copy of application for her to complete. CSW completed entire [employer] application and faxed it to [facility] office 4 times but each time there was no answer. CSW educated family on the [employer]AT application process. Patient understands that [employer] will only make one attempt to reach her and then will mail out a letter to schedule assessment appointment.  Patient shares that she already has an advance directive and does not wish to make any changes. She has this on her refrigerator as well. CSW encouraged her to provide her PCP with a copy of this advance directives as well. Patient admits that she has had some depressive symptoms but does not wish to gain any mental health resources at this time. However, CSW was able to provide appropriate emotional support and review healthy coping tools to alleviate her symptoms. Patient shares that she goes to church weekly which is her main source of socialization and that she enjoys spending time with her church members. Patient reports that she has never applied for Medicaid because "I probably won't be approved." Patient's income includes: $1225 from [employer], 1 pension which is $148 and another pension that is $288. Patient shares that her support system includes: neighbors, church members and family. She shares that her friend from church has been providing transportation for her to all medical appointments. She shares that her neighbor has been getting groceries for her too. Patient desire to  become more independent and feels that gaining [employer]AT services will assist with this.   THN CM Care Plan Problem One     Most Recent Value  Care Plan Problem One  Lack of stable transportation and resources  Role Documenting the Problem One  Clinical Social Worker  Care Plan for Problem One  Active  THN Long Term Goal (31-90 days)  Patient will gain a stable ramp within 90 days per patient or family report  THN Long Term Goal Start Date  12/06/16  Interventions for Problem One Long Term Goal  CSW completed home visit and made referral to [employer]. CSW will assist patient with the financial application for program and will monitor entire process and be an advocate for her when needed.  THN CM Short Term Goal #1 (0-30 days)  Patient will gain SCAT  services within 30 days per patient or family report  THN CM Short Term Goal #1 Start Date  12/06/16  Interventions for Short Term Goal #1  CSW completed home visit today and provide transportation resources handout. CSW completed [employer] application and faxed application to office. CSW educated family on eligibility process and will monitor entire process as needed.     Plan: CSW will route encounter to PCP. CSW will follow up within two weeks and will continue to provide social work assistance and support to assist with transportation needs. CSW will attempt to re-fax [employer]AT application within one week.  Eula Fried, BSW, MSW, [email or web address] Phone: [phone] Fax: [phone]

## 2016-12-09 ENCOUNTER — Other Ambulatory Visit: Payer: Self-pay | Admitting: Cardiology

## 2016-12-09 DIAGNOSIS — I739 Peripheral vascular disease, unspecified: Secondary | ICD-10-CM | POA: Diagnosis not present

## 2016-12-09 DIAGNOSIS — L03119 Cellulitis of unspecified part of limb: Secondary | ICD-10-CM | POA: Diagnosis not present

## 2016-12-09 DIAGNOSIS — E1122 Type 2 diabetes mellitus with diabetic chronic kidney disease: Secondary | ICD-10-CM | POA: Diagnosis not present

## 2016-12-09 DIAGNOSIS — D631 Anemia in chronic kidney disease: Secondary | ICD-10-CM | POA: Diagnosis not present

## 2016-12-09 DIAGNOSIS — N2589 Other disorders resulting from impaired renal tubular function: Secondary | ICD-10-CM | POA: Diagnosis not present

## 2016-12-09 DIAGNOSIS — I2510 Atherosclerotic heart disease of native coronary artery without angina pectoris: Secondary | ICD-10-CM | POA: Diagnosis not present

## 2016-12-09 DIAGNOSIS — I129 Hypertensive chronic kidney disease with stage 1 through stage 4 chronic kidney disease, or unspecified chronic kidney disease: Secondary | ICD-10-CM | POA: Diagnosis not present

## 2016-12-09 DIAGNOSIS — E118 Type 2 diabetes mellitus with unspecified complications: Secondary | ICD-10-CM | POA: Diagnosis not present

## 2016-12-09 DIAGNOSIS — N183 Chronic kidney disease, stage 3 (moderate): Secondary | ICD-10-CM | POA: Diagnosis not present

## 2016-12-09 DIAGNOSIS — R319 Hematuria, unspecified: Secondary | ICD-10-CM | POA: Diagnosis not present

## 2016-12-09 DIAGNOSIS — R809 Proteinuria, unspecified: Secondary | ICD-10-CM | POA: Diagnosis not present

## 2016-12-09 DIAGNOSIS — N2581 Secondary hyperparathyroidism of renal origin: Secondary | ICD-10-CM | POA: Diagnosis not present

## 2016-12-10 DIAGNOSIS — D509 Iron deficiency anemia, unspecified: Secondary | ICD-10-CM | POA: Diagnosis not present

## 2016-12-11 ENCOUNTER — Other Ambulatory Visit: Payer: Self-pay | Admitting: Pharmacist

## 2016-12-11 ENCOUNTER — Encounter: Payer: PPO | Attending: Internal Medicine | Admitting: Internal Medicine

## 2016-12-11 DIAGNOSIS — Z87891 Personal history of nicotine dependence: Secondary | ICD-10-CM | POA: Diagnosis not present

## 2016-12-11 DIAGNOSIS — H3530 Unspecified macular degeneration: Secondary | ICD-10-CM | POA: Insufficient documentation

## 2016-12-11 DIAGNOSIS — Z8551 Personal history of malignant neoplasm of bladder: Secondary | ICD-10-CM | POA: Insufficient documentation

## 2016-12-11 DIAGNOSIS — N183 Chronic kidney disease, stage 3 (moderate): Secondary | ICD-10-CM | POA: Diagnosis not present

## 2016-12-11 DIAGNOSIS — Z7901 Long term (current) use of anticoagulants: Secondary | ICD-10-CM | POA: Diagnosis not present

## 2016-12-11 DIAGNOSIS — L97423 Non-pressure chronic ulcer of left heel and midfoot with necrosis of muscle: Secondary | ICD-10-CM | POA: Insufficient documentation

## 2016-12-11 DIAGNOSIS — Z89511 Acquired absence of right leg below knee: Secondary | ICD-10-CM | POA: Diagnosis not present

## 2016-12-11 DIAGNOSIS — E1122 Type 2 diabetes mellitus with diabetic chronic kidney disease: Secondary | ICD-10-CM | POA: Insufficient documentation

## 2016-12-11 DIAGNOSIS — E785 Hyperlipidemia, unspecified: Secondary | ICD-10-CM | POA: Insufficient documentation

## 2016-12-11 DIAGNOSIS — Z992 Dependence on renal dialysis: Secondary | ICD-10-CM | POA: Insufficient documentation

## 2016-12-11 DIAGNOSIS — M1990 Unspecified osteoarthritis, unspecified site: Secondary | ICD-10-CM | POA: Insufficient documentation

## 2016-12-11 DIAGNOSIS — E11621 Type 2 diabetes mellitus with foot ulcer: Secondary | ICD-10-CM | POA: Diagnosis not present

## 2016-12-11 DIAGNOSIS — L03116 Cellulitis of left lower limb: Secondary | ICD-10-CM | POA: Diagnosis not present

## 2016-12-11 DIAGNOSIS — E1140 Type 2 diabetes mellitus with diabetic neuropathy, unspecified: Secondary | ICD-10-CM | POA: Insufficient documentation

## 2016-12-11 DIAGNOSIS — I252 Old myocardial infarction: Secondary | ICD-10-CM | POA: Insufficient documentation

## 2016-12-11 DIAGNOSIS — I4891 Unspecified atrial fibrillation: Secondary | ICD-10-CM | POA: Diagnosis not present

## 2016-12-11 DIAGNOSIS — D649 Anemia, unspecified: Secondary | ICD-10-CM | POA: Insufficient documentation

## 2016-12-11 DIAGNOSIS — I2510 Atherosclerotic heart disease of native coronary artery without angina pectoris: Secondary | ICD-10-CM | POA: Diagnosis not present

## 2016-12-11 DIAGNOSIS — L97221 Non-pressure chronic ulcer of left calf limited to breakdown of skin: Secondary | ICD-10-CM | POA: Insufficient documentation

## 2016-12-11 DIAGNOSIS — Z936 Other artificial openings of urinary tract status: Secondary | ICD-10-CM | POA: Insufficient documentation

## 2016-12-11 DIAGNOSIS — I87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: Secondary | ICD-10-CM | POA: Diagnosis not present

## 2016-12-11 DIAGNOSIS — I129 Hypertensive chronic kidney disease with stage 1 through stage 4 chronic kidney disease, or unspecified chronic kidney disease: Secondary | ICD-10-CM | POA: Diagnosis not present

## 2016-12-11 DIAGNOSIS — Z794 Long term (current) use of insulin: Secondary | ICD-10-CM | POA: Insufficient documentation

## 2016-12-11 DIAGNOSIS — E1151 Type 2 diabetes mellitus with diabetic peripheral angiopathy without gangrene: Secondary | ICD-10-CM | POA: Diagnosis not present

## 2016-12-11 DIAGNOSIS — L97421 Non-pressure chronic ulcer of left heel and midfoot limited to breakdown of skin: Secondary | ICD-10-CM | POA: Diagnosis not present

## 2016-12-11 NOTE — Patient Outreach (Signed)
[facility] [facility]) Care Management  12/11/2016  [name] [birth year] [id number]   Patient was called to follow up on Medication Assistance. No answer. HIPAA compliant message was left on her voicemail.  Plan:  Call patient back within 7-10 business days.  [name], PharmD, [place] Clinical Pharmacist [phone]

## 2016-12-12 ENCOUNTER — Ambulatory Visit: Payer: PPO | Admitting: Cardiology

## 2016-12-12 ENCOUNTER — Other Ambulatory Visit: Payer: Self-pay | Admitting: Licensed Clinical Social Worker

## 2016-12-12 NOTE — Patient Outreach (Signed)
[name]) Care Management  12/12/2016  [name] 10-31-1940 [id number]  Assessment- CSW completed outreach call to patient. Patient answered. She shares that she missed a call from [facility] [name] with [employer] and called her back but has not heard back yet. CSW provided contact number again and encouraged her to make another attempt and to also leave a voice message if [name] does not answer. Patient expressed understanding. CSW informed her that [employer] application was successfully emailed to office yesterday and that she should be getting a phone call to schedule assessment appointment within a few weeks.  Plan-CSW will follow up within two weeks and continue to provide social work assistance.  [name], BSW, MSW, [name] Phone: [phone] Fax: [phone]

## 2016-12-13 NOTE — Progress Notes (Signed)
[name], [name] ([id number]) Visit Report for 12/11/2016 Arrival Information Details Patient Name: [name], [name] Date of Service: 12/11/2016 1:30 PM Medical Record Number: [id number] Patient Account Number: [id number] Date of Birth/Sex: [birth year] (76 y.o. Female) Treating RN: Montey Hora Primary Care Char Feltman: Leighton Ruff Other Clinician: Referring Lamond Glantz: Leighton Ruff Treating Janace Decker/Extender: Tito Dine in Treatment: 16 Visit Information History Since Last Visit Added or deleted any medications: No Patient Arrived: Wheel Chair Any new allergies or adverse reactions: No Arrival Time: 13:17 Had a fall or experienced change in No Accompanied By: friend activities of daily living that may affect Transfer Assistance: None risk of falls: Patient Identification Verified: Yes Signs or symptoms of abuse/neglect since last No Secondary Verification Process Yes visito Completed: Hospitalized since last visit: No Patient Has Alerts: Yes Has Dressing in Place as Prescribed: Yes Patient Alerts: Patient on Blood Has Compression in Place as Prescribed: Yes Thinner Pain Present Now: No eliquis DMII ABI [place] >220 Electronic Signature(s) Signed: 12/11/2016 4:53:48 PM By: Montey Hora Entered By: Montey Hora on 12/11/2016 13:18:36 Kirshenbaum, [name] ([id number]) -------------------------------------------------------------------------------- Clinic Level of Care Assessment Details Patient Name: [name] Date of Service: 12/11/2016 1:30 PM Medical Record Number: [id number] Patient Account Number: [id number] Date of Birth/Sex: 08-Jun-1941 (76 y.o. Female) Treating RN: Montey Hora Primary Care Ilisha Blust: Leighton Ruff Other Clinician: Referring Teagen Mcleary: Leighton Ruff Treating Eldrige Pitkin/Extender: Tito Dine in Treatment: 16 Clinic Level of Care Assessment Items TOOL 4 Quantity Score []  - Use when only an EandM is performed  on FOLLOW-UP visit 0 ASSESSMENTS - Nursing Assessment / Reassessment X - Reassessment of Co-morbidities (includes updates in patient status) 1 10 X - Reassessment of Adherence to Treatment Plan 1 5 ASSESSMENTS - Wound and Skin Assessment / Reassessment X - Simple Wound Assessment / Reassessment - one wound 1 5 []  - Complex Wound Assessment / Reassessment - multiple wounds 0 []  - Dermatologic / Skin Assessment (not related to wound area) 0 ASSESSMENTS - Focused Assessment []  - Circumferential Edema Measurements - multi extremities 0 []  - Nutritional Assessment / Counseling / Intervention 0 X - Lower Extremity Assessment (monofilament, tuning fork, pulses) 1 5 []  - Peripheral Arterial Disease Assessment (using hand held doppler) 0 ASSESSMENTS - Ostomy and/or Continence Assessment and Care []  - Incontinence Assessment and Management 0 []  - Ostomy Care Assessment and Management (repouching, etc.) 0 PROCESS - Coordination of Care X - Simple Patient / Family Education for ongoing care 1 15 []  - Complex (extensive) Patient / Family Education for ongoing care 0 []  - Staff obtains Programmer, systems, Records, Test Results / Process Orders 0 []  - Staff telephones HHA, Nursing Homes / Clarify orders / etc 0 []  - Routine Transfer to another Facility (non-emergent condition) 0 Counce, Merryl B. ([id number]) []  - Routine Hospital Admission (non-emergent condition) 0 []  - New Admissions / Biomedical engineer / Ordering NPWT, Apligraf, etc. 0 []  - Emergency Hospital Admission (emergent condition) 0 X - Simple Discharge Coordination 1 10 []  - Complex (extensive) Discharge Coordination 0 PROCESS - Special Needs []  - Pediatric / Minor Patient Management 0 []  - Isolation Patient Management 0 []  - Hearing / Language / Visual special needs 0 []  - Assessment of Community assistance (transportation, D/C planning, etc.) 0 []  - Additional assistance / Altered mentation 0 []  - Support Surface(s) Assessment (bed,  cushion, seat, etc.) 0 INTERVENTIONS - Wound Cleansing / Measurement X - Simple Wound Cleansing - one wound 1 5 []  - Complex Wound Cleansing - multiple  wounds 0 X - Wound Imaging (photographs - any number of wounds) 1 5 []  - Wound Tracing (instead of photographs) 0 X - Simple Wound Measurement - one wound 1 5 []  - Complex Wound Measurement - multiple wounds 0 INTERVENTIONS - Wound Dressings []  - Small Wound Dressing one or multiple wounds 0 []  - Medium Wound Dressing one or multiple wounds 0 X - Large Wound Dressing one or multiple wounds 1 20 []  - Application of Medications - topical 0 []  - Application of Medications - injection 0 INTERVENTIONS - Miscellaneous []  - External ear exam 0 Buchler, Madicyn B. ([id number]) []  - Specimen Collection (cultures, biopsies, blood, body fluids, etc.) 0 []  - Specimen(s) / Culture(s) sent or taken to Lab for analysis 0 []  - Patient Transfer (multiple staff / Harrel Lemon Lift / Similar devices) 0 []  - Simple Staple / Suture removal (25 or less) 0 []  - Complex Staple / Suture removal (26 or more) 0 []  - Hypo / Hyperglycemic Management (close monitor of Blood Glucose) 0 []  - Ankle / Brachial Index (ABI) - do not check if billed separately 0 X - Vital Signs 1 5 Has the patient been seen at the hospital within the last three years: Yes Total Score: 90 Level Of Care: New/Established - Level 3 Electronic Signature(s) Signed: 12/11/2016 4:53:48 PM By: Montey Hora Entered By: Montey Hora on 12/11/2016 15:47:45 Peugh, [name] ([id number]) -------------------------------------------------------------------------------- Encounter Discharge Information Details Patient Name: [name] Date of Service: 12/11/2016 1:30 PM Medical Record Number: [id number] Patient Account Number: [id number] Date of Birth/Sex: Mar 03, 1941 (76 y.o. Female) Treating RN: Montey Hora Primary Care Josaphine Shimamoto: Leighton Ruff Other Clinician: Referring Tanganika Barradas: Leighton Ruff Treating Adryanna Friedt/Extender: Tito Dine in Treatment: 16 Encounter Discharge Information Items Discharge Pain Level: 0 Discharge Condition: Stable Ambulatory Status: Wheelchair Discharge Destination: Home Transportation: Private Auto Accompanied By: friend Schedule Follow-up Appointment: Yes Medication Reconciliation completed No and provided to Patient/Care Vivian Neuwirth: Provided on Clinical Summary of Care: 12/11/2016 Form Type Recipient Paper Patient BI Electronic Signature(s) Signed: 12/11/2016 2:35:35 PM By: Ruthine Dose Previous Signature: 12/11/2016 1:51:20 PM Version By: Montey Hora Entered By: Ruthine Dose on 12/11/2016 14:35:34 Hergert, [name] ([id number]) -------------------------------------------------------------------------------- Lower Extremity Assessment Details Patient Name: [name] Date of Service: 12/11/2016 1:30 PM Medical Record Number: [id number] Patient Account Number: [id number] Date of Birth/Sex: [birth year] (75 y.o. Female) Treating RN: Montey Hora Primary Care Brittanni Cariker: Leighton Ruff Other Clinician: Referring Schuyler Behan: Leighton Ruff Treating Krew Hortman/Extender: Tito Dine in Treatment: 16 Vascular Assessment Pulses: Dorsalis Pedis Palpable: [Left:Yes] Posterior Tibial Extremity colors, hair growth, and conditions: Extremity Color: [Left:Hyperpigmented] Hair Growth on Extremity: [Left:No] Temperature of Extremity: [Left:Warm] Capillary Refill: [Left:< 3 seconds] Electronic Signature(s) Signed: 12/11/2016 1:43:35 PM By: Montey Hora Entered By: Montey Hora on 12/11/2016 13:43:35 Ansley, [name] ([id number]) -------------------------------------------------------------------------------- Multi Wound Chart Details Patient Name: [name] Date of Service: 12/11/2016 1:30 PM Medical Record Number: [id number] Patient Account Number: [id number] Date of Birth/Sex: 1941/05/10 (76 y.o.  Female) Treating RN: Montey Hora Primary Care Ranell Skibinski: Leighton Ruff Other Clinician: Referring Gibran Veselka: Leighton Ruff Treating Katieann Hungate/Extender: Tito Dine in Treatment: 16 Vital Signs Height(in): 63 Pulse(bpm): 58 Weight(lbs): 205 Blood Pressure 141/54 (mmHg): Body Mass Index(BMI): 36 Temperature(F): 98.3 Respiratory Rate 18 (breaths/min): Photos: [N/A:N/A] Wound Location: Left Calcaneus - Lateral N/A N/A Wounding Event: Gradually Appeared N/A N/A Primary Etiology: Diabetic Wound/Ulcer of N/A N/A the Lower Extremity Comorbid History: Anemia, Coronary Artery N/A N/A Disease, Hypertension, Myocardial Infarction, Type II Diabetes,  Osteoarthritis, Neuropathy Date Acquired: 08/12/2016 N/A N/A Weeks of Treatment: 16 N/A N/A Wound Status: Open N/A N/A Measurements L x W x D 0.5x0.7x0.1 N/A N/A (cm) Area (cm) : 0.275 N/A N/A Volume (cm) : 0.027 N/A N/A % Reduction in Area: 96.20% N/A N/A % Reduction in Volume: 98.10% N/A N/A Classification: Grade 1 N/A N/A Exudate Amount: Large N/A N/A Exudate Type: Serous N/A N/A Exudate Color: amber N/A N/A Yes N/A N/A Worster, Rhianna B. ([id number]) Foul Odor After Cleansing: Odor Anticipated Due to No N/A N/A Product Use: Wound Margin: Flat and Intact N/A N/A Granulation Amount: Large (67-100%) N/A N/A Granulation Quality: Red N/A N/A Necrotic Amount: None Present (0%) N/A N/A Exposed Structures: Fascia: No N/A N/A Fat Layer (Subcutaneous Tissue) Exposed: No Tendon: No Muscle: No Joint: No Bone: No Limited to Skin Breakdown Epithelialization: None N/A N/A Periwound Skin Texture: Excoriation: No N/A N/A Induration: No Callus: No Crepitus: No Rash: No Scarring: No Periwound Skin Maceration: Yes N/A N/A Moisture: Dry/Scaly: No Periwound Skin Color: Atrophie Blanche: No N/A N/A Cyanosis: No Ecchymosis: No Erythema: No Hemosiderin Staining: No Mottled: No Pallor: No Rubor:  No Temperature: No Abnormality N/A N/A Tenderness on Yes N/A N/A Palpation: Wound Preparation: Ulcer Cleansing: N/A N/A Rinsed/Irrigated with Saline Topical Anesthetic Applied: Other: lidocaine 4% Treatment Notes Wound #2 (Left, Lateral Calcaneus) 1. Cleansed with: Clean wound with Normal Saline Foulks, Malayia B. ([id number]) 2. Anesthetic Topical Lidocaine 4% cream to wound bed prior to debridement 3. Peri-wound Care: Antifungal powder 4. Dressing Applied: Hydrafera Blue Other dressing (specify in notes) 7. Secured with Tape Other (specify in notes) Notes xtrasorb, kerlix and coban wrap Electronic Signature(s) Signed: 12/11/2016 5:52:53 PM By: Linton Ham MD Previous Signature: 12/11/2016 1:51:03 PM Version By: Montey Hora Entered By: Linton Ham on 12/11/2016 17:10:42 Turnipseed, [name] ([id number]) -------------------------------------------------------------------------------- Multi-Disciplinary Care Plan Details Patient Name: [name] Date of Service: 12/11/2016 1:30 PM Medical Record Number: [id number] Patient Account Number: [id number] Date of Birth/Sex: 04-09-1941 (76 y.o. Female) Treating RN: Montey Hora Primary Care Aurora Rody: Leighton Ruff Other Clinician: Referring Malaia Buchta: Leighton Ruff Treating Juanna Pudlo/Extender: Tito Dine in Treatment: 16 Active Inactive ` Abuse / Safety / Falls / Self Care Management Nursing Diagnoses: Potential for falls Goals: Patient will remain injury free Date Initiated: 08/21/2016 Target Resolution Date: 10/08/2016 Goal Status: Active Interventions: Assess fall risk on admission and as needed Notes: ` Necrotic Tissue Nursing Diagnoses: Impaired tissue integrity related to necrotic/devitalized tissue Goals: Necrotic/devitalized tissue will be minimized in the wound bed Date Initiated: 08/21/2016 Target Resolution Date: 10/08/2016 Goal Status: Active Interventions: Assess patient  pain level pre-, during and post procedure and prior to discharge Notes: ` Orientation to the Wound Care Program Nursing Diagnoses: Knowledge deficit related to the wound healing center program MAELEIGH, BUSCHMAN ([id number]) Goals: Patient/caregiver will verbalize understanding of the [place] Date Initiated: 08/21/2016 Target Resolution Date: 10/08/2016 Goal Status: Active Interventions: Provide education on orientation to the wound center Notes: ` Peripheral Neuropathy Nursing Diagnoses: Potential alteration in peripheral tissue perfusion (select prior to confirmation of diagnosis) Goals: Patient/caregiver will verbalize understanding of disease process and disease management Date Initiated: 08/21/2016 Target Resolution Date: 10/08/2016 Goal Status: Active Interventions: Assess signs and symptoms of neuropathy upon admission and as needed Notes: ` Venous Leg Ulcer Nursing Diagnoses: Actual venous Insuffiency (use after diagnosis is confirmed) Goals: Patient will maintain optimal edema control Date Initiated: 08/21/2016 Target Resolution Date: 10/08/2016 Goal Status: Active Interventions: Assess peripheral edema status every  visit. Notes: ` Wound/Skin Impairment Nursing Diagnoses: Impaired tissue integrity Bills, Charon B. ([id number]) Goals: Patient/caregiver will verbalize understanding of skin care regimen Date Initiated: 08/21/2016 Target Resolution Date: 10/08/2016 Goal Status: Active Ulcer/skin breakdown will have a volume reduction of 30% by week 4 Date Initiated: 08/21/2016 Target Resolution Date: 10/08/2016 Goal Status: Active Ulcer/skin breakdown will have a volume reduction of 50% by week 8 Date Initiated: 08/21/2016 Target Resolution Date: 10/29/2016 Goal Status: Active Ulcer/skin breakdown will have a volume reduction of 80% by week 12 Date Initiated: 08/21/2016 Target Resolution Date: 11/12/2016 Goal Status: Active Ulcer/skin breakdown  will heal within 14 weeks Date Initiated: 08/21/2016 Target Resolution Date: 11/12/2016 Goal Status: Active Interventions: Assess patient/caregiver ability to obtain necessary supplies Assess patient/caregiver ability to perform ulcer/skin care regimen upon admission and as needed Assess ulceration(s) every visit Notes: Electronic Signature(s) Signed: 12/11/2016 1:43:43 PM By: Montey Hora Entered By: Montey Hora on 12/11/2016 13:43:42 Mohs, [name] ([id number]) -------------------------------------------------------------------------------- Pain Assessment Details Patient Name: [name] Date of Service: 12/11/2016 1:30 PM Medical Record Number: [id number] Patient Account Number: [id number] Date of Birth/Sex: 04-13-1941 (76 y.o. Female) Treating RN: Montey Hora Primary Care Jarrette Dehner: Leighton Ruff Other Clinician: Referring Ahuva Poynor: Leighton Ruff Treating Orrin Yurkovich/Extender: Tito Dine in Treatment: 16 Active Problems Location of Pain Severity and Description of Pain Patient Has Paino No Site Locations Pain Management and Medication Current Pain Management: Notes Topical or injectable lidocaine is offered to patient for acute pain when surgical debridement is performed. If needed, Patient is instructed to use over the counter pain medication for the following 24-48 hours after debridement. Wound care MDs do not prescribed pain medications. Patient has chronic pain or uncontrolled pain. Patient has been instructed to make an appointment with their Primary Care Physician for pain management. Electronic Signature(s) Signed: 12/11/2016 4:53:48 PM By: Montey Hora Entered By: Montey Hora on 12/11/2016 13:18:47 Nesheiwat, [name] ([id number]) -------------------------------------------------------------------------------- Patient/Caregiver Education Details Patient Name: [name] Date of Service: 12/11/2016 1:30 PM Medical Record  Patient Account Number: [id number] Number: Treating RN: Montey Hora April 16, 1941 (76 y.o. Other Clinician: Date of Birth/Gender: Female) Treating ROBSON, MICHAEL Primary Care Physician: Leighton Ruff Physician/Extender: G Referring Physician: Bethanie Dicker in Treatment: 16 Education Assessment Education Provided To: Patient and Caregiver Education Topics Provided Venous: Handouts: Other: leg elevation and nurse visit if needed Methods: Explain/Verbal Responses: State content correctly Electronic Signature(s) Signed: 12/11/2016 4:53:48 PM By: Montey Hora Entered By: Montey Hora on 12/11/2016 13:51:43 Mol, [name] ([id number]) -------------------------------------------------------------------------------- Wound Assessment Details Patient Name: [name] Date of Service: 12/11/2016 1:30 PM Medical Record Number: [id number] Patient Account Number: [id number] Date of Birth/Sex: 1940/09/08 (76 y.o. Female) Treating RN: Montey Hora Primary Care Dalante Minus: Leighton Ruff Other Clinician: Referring Akito Boomhower: Leighton Ruff Treating Anwyn Kriegel/Extender: Tito Dine in Treatment: 16 Wound Status Wound Number: 2 Primary Diabetic Wound/Ulcer of the Lower Etiology: Extremity Wound Location: Left Calcaneus - Lateral Wound Open Wounding Event: Gradually Appeared Status: Date Acquired: 08/12/2016 Comorbid Anemia, Coronary Artery Disease, Weeks Of Treatment: 16 History: Hypertension, Myocardial Infarction, Clustered Wound: No Type II Diabetes, Osteoarthritis, Neuropathy Photos Photo Uploaded By: Montey Hora on 12/11/2016 15:50:46 Wound Measurements Length: (cm) 0.5 Width: (cm) 0.7 Depth: (cm) 0.1 Area: (cm) 0.275 Volume: (cm) 0.027 % Reduction in Area: 96.2% % Reduction in Volume: 98.1% Epithelialization: None Tunneling: No Undermining: No Wound Description Classification: Grade 1 Foul Odor Afte Wound Margin:  Flat and Intact Due to Product Exudate Amount: Large Slough/Fibrino Exudate Type: Serous Exudate  Color: amber r Cleansing: Yes Use: No Yes Wound Bed Granulation Amount: Large (67-100%) Exposed Structure Granulation Quality: Red Fascia Exposed: No Necrotic Amount: None Present (0%) Fat Layer (Subcutaneous Tissue) Exposed: No Aldape, Aritha B. ([id number]) Tendon Exposed: No Muscle Exposed: No Joint Exposed: No Bone Exposed: No Limited to Skin Breakdown Periwound Skin Texture Texture Color No Abnormalities Noted: No No Abnormalities Noted: No Callus: No Atrophie Blanche: No Crepitus: No Cyanosis: No Excoriation: No Ecchymosis: No Induration: No Erythema: No Rash: No Hemosiderin Staining: No Scarring: No Mottled: No Pallor: No Moisture Rubor: No No Abnormalities Noted: No Dry / Scaly: No Temperature / Pain Maceration: Yes Temperature: No Abnormality Tenderness on Palpation: Yes Wound Preparation Ulcer Cleansing: Rinsed/Irrigated with Saline Topical Anesthetic Applied: Other: lidocaine 4%, Treatment Notes Wound #2 (Left, Lateral Calcaneus) 1. Cleansed with: Clean wound with Normal Saline 2. Anesthetic Topical Lidocaine 4% cream to wound bed prior to debridement 3. Peri-wound Care: Antifungal powder 4. Dressing Applied: Hydrafera Blue Other dressing (specify in notes) 7. Secured with Tape Other (specify in notes) Notes xtrasorb, kerlix and coban wrap Electronic Signature(s) Signed: 12/11/2016 1:44:13 PM By: Montey Hora Entered By: Montey Hora on 12/11/2016 13:44:13 Ledwell, [name] ([id number]) Shatzer, [name] ([id number]) -------------------------------------------------------------------------------- [place] Details Patient Name: [name] Date of Service: 12/11/2016 1:30 PM Medical Record Number: [id number] Patient Account Number: [id number] Date of Birth/Sex: 05-Jan-1941 (76 y.o. Female) Treating RN: Montey Hora Primary Care  Farmer Mccahill: Leighton Ruff Other Clinician: Referring Slade Pierpoint: Leighton Ruff Treating Caniya Tagle/Extender: Tito Dine in Treatment: 16 Vital Signs Time Taken: 13:18 Temperature (F): 98.3 Height (in): 63 Pulse (bpm): 58 Weight (lbs): 205 Respiratory Rate (breaths/min): 18 Body Mass Index (BMI): 36.3 Blood Pressure (mmHg): 141/54 Reference Range: 80 - 120 mg / dl Electronic Signature(s) Signed: 12/11/2016 4:53:48 PM By: Montey Hora Entered By: Montey Hora on 12/11/2016 13:19:55

## 2016-12-13 NOTE — Progress Notes (Signed)
[name], [name] ([id number]) Visit Report for 12/11/2016 Chief Complaint Document Details Patient Name: [name] Date of Service: 12/11/2016 1:30 PM Medical Record Patient Account Number: [id number] Number: Treating RN: Montey Hora October 28, 1940 (76 y.o. Other Clinician: Date of Birth/Sex: Female) Treating Elliot Meldrum Primary Care Provider: Leighton Ruff Provider/Extender: G Referring Provider: Bethanie Dicker in Treatment: 16 Information Obtained from: Patient Chief Complaint Patient is here for review of a wound on the left heel Electronic Signature(s) Signed: 12/11/2016 5:52:53 PM By: Linton Ham MD Entered By: Linton Ham on 12/11/2016 17:10:53 [name], [name] ([id number]) -------------------------------------------------------------------------------- HPI Details Patient Name: [name] Date of Service: 12/11/2016 1:30 PM Medical Record Patient Account Number: [id number] Number: Treating RN: Montey Hora Jul 20, 1941 (76 y.o. Other Clinician: Date of Birth/Sex: Female) Treating Shenandoah Vandergriff Primary Care Provider: Leighton Ruff Provider/Extender: G Referring Provider: Bethanie Dicker in Treatment: 16 History of Present Illness HPI Description: 08/21/16; [name] is a 76 year old woman who I cared for in our [facility] clinic up until January 2016. At that point she had a wound in her left great toe. 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 He follows with vein and vascular and has a follow-up appointment in early February. She is status left femoral to anterior tibial bypass in February 2016. She is also noted to have venous stasis changes and a history of ulcers. 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 fib on Eliquis. She has bladder cancer and has a chronic urostomy. The patient tells me that 2 weeks ago she had a right prosthesis and a new left diabetic shoe. Roughly one week ago she noted a wound on her left lateral heel. She zinc oxide and some silver alginate she had left over until she could be seen here. It was also noted that she has had blistering on her posterior left calf and an open area here as well. Her records note that she has chronic venous insufficiency with chronic inflammation especially in the left mid calf area. The patient has not been systemically unwell no fever or chills. She is reasonably insensate and has not felt any pain 09/04/16; the patient has a fairly substantial wound on the left lateral calcaneus. We have been using Santyl to this area. Blister on the left lower leg appears to of healed the patient also follows with Dr. Sherren Mocha early of vein and vascular. He felt that she should have enough blood flow to heal her left heel wound. He renewed her doxycycline for a further week. She is status post left femoral to anterior tibial bypass in February 2016. 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 She has a remote history of a right BKA. The XRAY that I ordered last week showed no definite evidence of osteomyelitis however a small focus of demineralization was noted in the plantar aspect of the calcaneus which might warrant an MRI possibly indicating early osteomyelitis however this is not in the area of her wound and I'm going to keep this under advisement for now. 09/11/16; still a substantial wound in the left lateral calcaneus we've been using Santyl 09/18/16; still a substantial wound on the left lateral calcaneus.  We are still using Santyl 09/24/16; still a substantial wound on the left lateral calcaneus. We're using Santyl change one or 2 times a week  and mechanical debridement which he comes to [place]. 10/02/16; left lateral calcaneus using Santyl changing 3 times a week. Run Apligraf through her insurance 10/09/16; still using Santyl without much change. Being changed 2 times a week by the patient and once [name], [name] B. ([id number]) here. We did not run Apligraf last week we'll do that this week 10/16/16; the area on her left lateral heel; started Iodoflex last week. Apligraf set a co-pay of $182 per for application 9/93/71; left lateral heel. on iodoflex. approved for appligraft, start next week 10/30/16 Apligraf #1 today 11/13/16- patient is here for follow up evaluation of her left lateral calcaneus ulcer. She had Apligraf #1 placed 2 weeks ago. She is tolerating compression. She is voicing no complaints or concerns. She did return last week for nurse visit. 11/27/16; dramatic improvement in the overall appearance of this wound. What looks to be a tinea infection around this. Apligraf #3 12/11/16; only a small healthy granulated oval wound remains on the lateral aspect of her calcaneus. I think this is probably too small to consider an Apligraf that this point although I gave this some thought. Electronic Signature(s) Signed: 12/11/2016 5:52:53 PM By: Linton Ham MD Entered By: Linton Ham on 12/11/2016 17:11:26 [name], [name] ([id number]) -------------------------------------------------------------------------------- Physical Exam Details Patient Name: [name] Date of Service: 12/11/2016 1:30 PM Medical Record Patient Account Number: [id number] Number: Treating RN: Montey Hora 11-25-1940 (76 y.o. Other Clinician: Date of Birth/Sex: Female) Treating Haylei Cobin Primary Care Provider: Leighton Ruff Provider/Extender: G Referring Provider: Bethanie Dicker in Treatment: 16 Constitutional Sitting or standing Blood Pressure is within target range for patient.. Pulse regular and within target  range for patient.Marland Kitchen Respirations regular, non-labored and within target range.. Temperature is normal and within the target range for the patient.. Patient's appearance is neat and clean. Appears in no acute distress. Well nourished and well developed.. Cardiovascular . Notes Would exam; continued amazing improvement here. Healthy granulation and a very small open area remaining. No debridement was required. I thought this too small to consider placing an Apligraf again today. Electronic Signature(s) Signed: 12/11/2016 5:52:53 PM By: Linton Ham MD Entered By: Linton Ham on 12/11/2016 17:13:31 [name], [name] ([id number]) -------------------------------------------------------------------------------- Physician Orders Details Patient Name: [name] Date of Service: 12/11/2016 1:30 PM Medical Record Patient Account Number: [id number] Number: Treating RN: Montey Hora January 13, 1941 (76 y.o. Other Clinician: Date of Birth/Sex: Female) Treating Carlin Attridge Primary Care Provider: Leighton Ruff Provider/Extender: G Referring Provider: Bethanie Dicker in Treatment: 54 Verbal / Phone Orders: No Diagnosis Coding Wound Cleansing Wound #2 Left,Lateral Calcaneus o Clean wound with Normal Saline. Anesthetic Wound #2 Left,Lateral Calcaneus o Topical Lidocaine 4% cream applied to wound bed prior to debridement Skin Barriers/Peri-Wound Care Wound #2 Left,Lateral Calcaneus o Skin Prep o Antifungal cream Primary Wound Dressing Wound #2 Left,Lateral Calcaneus o Hydrafera Blue Secondary Dressing Wound #2 Left,Lateral Calcaneus o XtraSorb Dressing Change Frequency Wound #2 Left,Lateral Calcaneus o Change dressing every week Follow-up Appointments Wound #2 Left,Lateral Calcaneus o Return Appointment in 1 week. Edema Control Wound #2 Left,Lateral Calcaneus o Kerlix and Coban - Left Lower Extremity - unna to anchor Tebbetts,  Kimela B. ([id number]) Off-Loading Wound #2 Left,Lateral Calcaneus o Other: - open heel shoe Additional Orders / Instructions Wound #2 Left,Lateral Calcaneus o Increase protein intake. Electronic Signature(s) Signed: 12/11/2016  4:53:48 PM By: Montey Hora Signed: 12/11/2016 5:52:53 PM By: Linton Ham MD Entered By: Montey Hora on 12/11/2016 14:17:48 [name], [name] ([id number]) -------------------------------------------------------------------------------- Problem List Details Patient Name: [name] Date of Service: 12/11/2016 1:30 PM Medical Record Patient Account Number: [id number] Number: Treating RN: Montey Hora 13-Dec-1940 (76 y.o. Other Clinician: Date of Birth/Sex: Female) Treating Romonia Yanik Primary Care Provider: Leighton Ruff Provider/Extender: G Referring Provider: Bethanie Dicker in Treatment: 16 Active Problems ICD-10 Encounter Code Description Active Date Diagnosis E11.621 Type 2 diabetes mellitus with foot ulcer 08/21/2016 Yes E11.51 Type 2 diabetes mellitus with diabetic peripheral 08/21/2016 Yes angiopathy without gangrene E11.40 Type 2 diabetes mellitus with diabetic neuropathy, 08/21/2016 Yes unspecified L97.423 Non-pressure chronic ulcer of left heel and midfoot with 08/21/2016 Yes necrosis of muscle I87.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 hypertension (idiopathic) with 08/21/2016 Yes inflammation of left lower extremity L97.221 Non-pressure chronic ulcer of left calf limited to 08/21/2016 Yes breakdown of skin L03.116 Cellulitis of left lower limb 08/21/2016 Yes Inactive Problems Resolved Problems Electronic Signature(s) NELDA, LUCKEY ([id number]) Signed: 12/11/2016 5:52:53 PM By: Linton Ham MD Entered By: Linton Ham on 12/11/2016 17:10:31 [name], [name] ([id number]) -------------------------------------------------------------------------------- Progress Note Details Patient Name: [name] Date  of Service: 12/11/2016 1:30 PM Medical Record Patient Account Number: [id number] Number: Treating RN: Montey Hora [birth year] (76 y.o. Other Clinician: Date of Birth/Sex: Female) Treating Chantella Creech Primary Care Provider: Leighton Ruff Provider/Extender: G Referring Provider: Bethanie Dicker in Treatment: 16 Subjective Chief Complaint Information obtained from Patient Patient is here for review of a wound on the left heel History of Present Illness (HPI) 08/21/16; Mrs. Cadogan is a 76 year old woman who I cared for in our [facility] clinic up until January 2016. At that point she had a wound in her left great toe. 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 He follows with vein and vascular and has a follow-up appointment in early February. She is status left femoral to anterior tibial bypass in February 2016. She is also noted to have venous stasis changes and a history of ulcers. 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 fib on Eliquis. She has bladder cancer and has a chronic urostomy. The patient tells me that 2 weeks ago she had a right prosthesis and a new left diabetic shoe. Roughly one week ago she noted a wound on her left lateral heel. She zinc oxide and some silver alginate she had left over until she could be seen here. It was also noted that she has had blistering on her posterior left calf and an open area here as well. Her records note that she has chronic venous insufficiency with chronic inflammation especially in the left mid calf area. The patient has not been systemically unwell no fever or chills. She is reasonably insensate and has not felt any  pain 09/04/16; the patient has a fairly substantial wound on the left lateral calcaneus. We have been using Santyl to this area. Blister on the left lower leg appears to of healed the patient also follows with Dr. Sherren Mocha early of vein and vascular. He felt that she should have enough blood flow to heal her left heel wound.  He renewed her doxycycline for a further week. She is status post left femoral to anterior tibial bypass in February 2016. 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 She has a remote history of a right BKA. The XRAY that I ordered last week showed no definite evidence of osteomyelitis however a small focus of demineralization was noted in the plantar aspect of the calcaneus which might warrant an MRI possibly indicating early osteomyelitis however this is not in the area of her wound and I'm going to keep this under advisement for now. 09/11/16; still a substantial wound in the left lateral calcaneus we've been using Santyl [name], [name] B. ([id number]) 09/18/16; still a substantial wound on the left lateral calcaneus. We are still using Santyl 09/24/16; still a substantial wound on the left lateral calcaneus. We're using Santyl change one or 2 times a week and mechanical debridement which he comes to [place]. 10/02/16; left lateral calcaneus using Santyl changing 3 times a week. Run Apligraf through her insurance 10/09/16; still using Santyl without much change. Being changed 2 times a week by the patient and once here. We did not run Apligraf last week we'll do that this week 10/16/16; the area on her left lateral heel; started Iodoflex last week. Apligraf set a co-pay of $397 per for application 6/73/41; left lateral heel. on iodoflex. approved for appligraft, start next week 10/30/16 Apligraf #1 today 11/13/16- patient is here for follow up evaluation of her left lateral calcaneus ulcer. She had Apligraf #1 placed 2 weeks ago. She is tolerating compression. She is voicing no  complaints or concerns. She did return last week for nurse visit. 11/27/16; dramatic improvement in the overall appearance of this wound. What looks to be a tinea infection around this. Apligraf #3 12/11/16; only a small healthy granulated oval wound remains on the lateral aspect of her calcaneus. I think this is probably too small to consider an Apligraf that this point although I gave this some thought. Objective Constitutional Sitting or standing Blood Pressure is within target range for patient.. Pulse regular and within target range for patient.Marland Kitchen Respirations regular, non-labored and within target range.. Temperature is normal and within the target range for the patient.. Patient's appearance is neat and clean. Appears in no acute distress. Well nourished and well developed.. Vitals Time Taken: 1:18 PM, Height: 63 in, Weight: 205 lbs, BMI: 36.3, Temperature: 98.3 F, Pulse: 58 bpm, Respiratory Rate: 18 breaths/min, Blood Pressure: 141/54 mmHg. General Notes: Would exam; continued amazing improvement here. Healthy granulation and a very small open area remaining. No debridement was required. I thought this too small to consider placing an Apligraf again today. Integumentary (Hair, Skin) Wound #2 status is Open. Original cause of wound was Gradually Appeared. The wound is located on the Left,Lateral Calcaneus. The wound measures 0.5cm length x 0.7cm width x 0.1cm depth; 0.275cm^2 area and 0.027cm^3 volume. The wound is limited to skin breakdown. There is no tunneling or undermining noted. There is a large amount of serous drainage noted. The wound margin is flat and intact. There is large (67-100%) red granulation within the wound bed. There is no necrotic tissue within the wound bed. The periwound skin appearance exhibited: Maceration. The periwound skin appearance did not exhibit: Callus, Crepitus, Excoriation, Induration, Rash, Scarring, Dry/Scaly, Atrophie Blanche, Cyanosis,  Ecchymosis, Hemosiderin Staining, Mottled, Pallor, Rubor, Erythema. Periwound temperature was noted as No [name], [name] B. ([id number]) Abnormality. The periwound has tenderness on palpation. 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 Anesthetic: Wound #2 Left,Lateral Calcaneus: Topical Lidocaine 4% cream applied to wound bed prior to debridement Skin Barriers/Peri-Wound Care: Wound #2 Left,Lateral Calcaneus: Skin Prep Antifungal cream Primary Wound Dressing: Wound #2 Left,Lateral Calcaneus: Hydrafera Blue Secondary Dressing: Wound #2 Left,Lateral Calcaneus: XtraSorb Dressing Change Frequency: Wound #2 Left,Lateral Calcaneus: Change dressing every week Follow-up Appointments: Wound #2 Left,Lateral Calcaneus: Return Appointment in 1 week. Edema Control: Wound #2 Left,Lateral Calcaneus: Kerlix and Coban - Left Lower Extremity - unna to anchor [name], [name] B. ([id number]) Off-Loading: Wound #2 Left,Lateral Calcaneus: Other: - open heel shoe Additional Orders / Instructions: Wound #2 Left,Lateral Calcaneus: Increase protein intake. #1 change primary dressing Hydrofera Blue, extrasorb Kerlix Coban. I am hopeful that this will be closed by next week or at least much smaller although it is already a small healthy-looking wound. #2 I did not use the Apligraf today Electronic Signature(s) Signed:  12/11/2016 5:52:53 PM By: Linton Ham MD Entered By: Linton Ham on 12/11/2016 17:14:26 [name], [name] ([id number]) -------------------------------------------------------------------------------- Bell Details Patient Name: [name] Date of Service: 12/11/2016 Medical Record Patient Account Number: [id number] Number: Treating RN: Montey Hora 1941-01-23 (76 y.o. 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 I87.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 L97.221 Non-pressure chronic ulcer of left calf limited to breakdown of skin L03.116 Cellulitis of left lower limb Facility Procedures CPT4 Code: [id number] Description: 99213 - WOUND CARE VISIT-LEV 3 EST PT Modifier: Quantity: 1 Physician Procedures CPT4 Code Description: [id number] 92924 - WC PHYS LEVEL 2 - EST PT ICD-10 Description Diagnosis E11.621 Type 2 diabetes mellitus with foot ulcer L97.423 Non-pressure chronic ulcer of left heel and midfoo Modifier: t with necrosis Quantity: 1 of muscle Electronic Signature(s) Signed: 12/11/2016 5:52:53 PM By: Linton Ham MD Entered By: Linton Ham on 12/11/2016 17:14:51

## 2016-12-16 ENCOUNTER — Other Ambulatory Visit: Payer: Self-pay | Admitting: Licensed Clinical Social Worker

## 2016-12-16 DIAGNOSIS — Z9889 Other specified postprocedural states: Secondary | ICD-10-CM | POA: Diagnosis not present

## 2016-12-16 DIAGNOSIS — Z933 Colostomy status: Secondary | ICD-10-CM | POA: Diagnosis not present

## 2016-12-16 DIAGNOSIS — N390 Urinary tract infection, site not specified: Secondary | ICD-10-CM | POA: Diagnosis not present

## 2016-12-16 NOTE — Patient Outreach (Signed)
[facility]) Care Management  12/16/2016  [name] 1941-08-04 [id number]   Assessment- CSW completed outreach call to patient and she answered. CSW questioned if she was able to establish contact with [name] with [employer] and she stated "She has called me twice and I have called her back twice but haven't reach her." CSW informed her that she will contact [employer] today. CSW contacted [employer] and spoke with [employer]. She reports that she has called patient more than twice and has not been abel to reach her. She shared that she will try to make another outreach attempt to patient again today if she has time. CSW completed call to patient and provided update and encouraged her to have phone on her.  Plan-CSW will follow up within one week.  [name], BSW, MSW, [name] Phone: [phone] Fax: [phone]

## 2016-12-18 ENCOUNTER — Other Ambulatory Visit: Payer: Self-pay | Admitting: Pharmacist

## 2016-12-18 ENCOUNTER — Encounter: Payer: PPO | Admitting: Internal Medicine

## 2016-12-18 DIAGNOSIS — S91302A Unspecified open wound, left foot, initial encounter: Secondary | ICD-10-CM | POA: Diagnosis not present

## 2016-12-18 DIAGNOSIS — E11621 Type 2 diabetes mellitus with foot ulcer: Secondary | ICD-10-CM | POA: Diagnosis not present

## 2016-12-19 NOTE — Progress Notes (Addendum)
TIANI, STANBERY ([id number]) Visit Report for 12/18/2016 Arrival Information Details Patient Name: [name] Date of Service: 12/18/2016 3:45 PM Medical Record Number: [id number] Patient Account Number: [id number] Date of Birth/Sex: October 20, 1940 (76 y.o. Female) Treating RN: Afful, RN, BSN, Velva Harman Primary Care Estanislao Harmon: Leighton Ruff Other Clinician: Referring Noralee Dutko: Leighton Ruff Treating Alexes Menchaca/Extender: Tito Dine in Treatment: 67 Visit Information History Since Last Visit All ordered tests and consults were completed: No Patient Arrived: Wheel Chair Added or deleted any medications: No Arrival Time: 15:37 Any new allergies or adverse reactions: No Accompanied By: friend Had a fall or experienced change in No Transfer Assistance: None activities of daily living that may affect Patient Identification Verified: Yes risk of falls: Secondary Verification Process Yes Signs or symptoms of abuse/neglect since last No Completed: visito Patient Has Alerts: Yes Hospitalized since last visit: No Patient Alerts: Patient on Blood Has Dressing in Place as Prescribed: Yes Thinner Pain Present Now: No eliquis DMII ABI [place] >220 Electronic Signature(s) Signed: 12/18/2016 3:37:30 PM By: Regan Lemming BSN, RN Entered By: Regan Lemming on 12/18/2016 15:37:30 Onofrio, [name] ([id number]) -------------------------------------------------------------------------------- Clinic Level of Care Assessment Details Patient Name: [name] Date of Service: 12/18/2016 3:45 PM Medical Record Number: [id number] Patient Account Number: [id number] Date of Birth/Sex: 1940/10/26 (76 y.o. Female) Treating RN: Afful, RN, BSN, Administrator, sports Primary Care Lyndsie Wallman: Leighton Ruff Other Clinician: Referring Terrye Dombrosky: Leighton Ruff Treating Ermal Haberer/Extender: Tito Dine in Treatment: 17 Clinic Level of Care Assessment Items TOOL 4 Quantity Score []  - Use when only an  EandM is performed on FOLLOW-UP visit 0 ASSESSMENTS - Nursing Assessment / Reassessment X - Reassessment of Co-morbidities (includes updates in patient status) 1 10 X - Reassessment of Adherence to Treatment Plan 1 5 ASSESSMENTS - Wound and Skin Assessment / Reassessment X - Simple Wound Assessment / Reassessment - one wound 1 5 []  - Complex Wound Assessment / Reassessment - multiple wounds 0 []  - Dermatologic / Skin Assessment (not related to wound area) 0 ASSESSMENTS - Focused Assessment []  - Circumferential Edema Measurements - multi extremities 0 []  - Nutritional Assessment / Counseling / Intervention 0 X - Lower Extremity Assessment (monofilament, tuning fork, pulses) 1 5 []  - Peripheral Arterial Disease Assessment (using hand held doppler) 0 ASSESSMENTS - Ostomy and/or Continence Assessment and Care []  - Incontinence Assessment and Management 0 []  - Ostomy Care Assessment and Management (repouching, etc.) 0 PROCESS - Coordination of Care X - Simple Patient / Family Education for ongoing care 1 15 []  - Complex (extensive) Patient / Family Education for ongoing care 0 []  - Staff obtains Programmer, systems, Records, Test Results / Process Orders 0 []  - Staff telephones HHA, Nursing Homes / Clarify orders / etc 0 []  - Routine Transfer to another Facility (non-emergent condition) 0 Skeens, Madalaine B. ([id number]) []  - Routine Hospital Admission (non-emergent condition) 0 []  - New Admissions / Biomedical engineer / Ordering NPWT, Apligraf, etc. 0 []  - Emergency Hospital Admission (emergent condition) 0 []  - Simple Discharge Coordination 0 []  - Complex (extensive) Discharge Coordination 0 PROCESS - Special Needs []  - Pediatric / Minor Patient Management 0 []  - Isolation Patient Management 0 []  - Hearing / Language / Visual special needs 0 []  - Assessment of Community assistance (transportation, D/C planning, etc.) 0 []  - Additional assistance / Altered mentation 0 []  - Support Surface(s)  Assessment (bed, cushion, seat, etc.) 0 INTERVENTIONS - Wound Cleansing / Measurement X - Simple Wound Cleansing - one wound 1 5 []  -  Complex Wound Cleansing - multiple wounds 0 []  - Wound Imaging (photographs - any number of wounds) 0 []  - Wound Tracing (instead of photographs) 0 X - Simple Wound Measurement - one wound 1 5 []  - Complex Wound Measurement - multiple wounds 0 INTERVENTIONS - Wound Dressings X - Small Wound Dressing one or multiple wounds 1 10 []  - Medium Wound Dressing one or multiple wounds 0 []  - Large Wound Dressing one or multiple wounds 0 []  - Application of Medications - topical 0 []  - Application of Medications - injection 0 INTERVENTIONS - Miscellaneous []  - External ear exam 0 Garrabrant, Ruthann B. ([id number]) []  - Specimen Collection (cultures, biopsies, blood, body fluids, etc.) 0 []  - Specimen(s) / Culture(s) sent or taken to Lab for analysis 0 []  - Patient Transfer (multiple staff / Harrel Lemon Lift / Similar devices) 0 []  - Simple Staple / Suture removal (25 or less) 0 []  - Complex Staple / Suture removal (26 or more) 0 []  - Hypo / Hyperglycemic Management (close monitor of Blood Glucose) 0 []  - Ankle / Brachial Index (ABI) - do not check if billed separately 0 X - Vital Signs 1 5 Has the patient been seen at the hospital within the last three years: Yes Total Score: 65 Level Of Care: New/Established - Level 2 Electronic Signature(s) Signed: 12/18/2016 5:00:18 PM By: Regan Lemming BSN, RN Entered By: Regan Lemming on 12/18/2016 16:08:18 [name] ([id number]) -------------------------------------------------------------------------------- Encounter Discharge Information Details Patient Name: [name] Date of Service: 12/18/2016 3:45 PM Medical Record Number: [id number] Patient Account Number: [id number] Date of Birth/Sex: 07/05/1941 (76 y.o. Female) Treating RN: Afful, RN, BSN, Velva Harman Primary Care Giavonna Pflum: Leighton Ruff Other  Clinician: Referring Sheriden Archibeque: Leighton Ruff Treating Brittin Janik/Extender: Tito Dine in Treatment: 17 Encounter Discharge Information Items Discharge Pain Level: 0 Discharge Condition: Stable Ambulatory Status: Wheelchair Discharge Destination: Home Transportation: Other Accompanied By: FRIEND Schedule Follow-up Appointment: No Medication Reconciliation completed No and provided to Patient/Care Averlee Swartz: Patient Clinical Summary of Care: Declined Electronic Signature(s) Signed: 12/18/2016 5:14:31 PM By: Regan Lemming BSN, RN Previous Signature: 12/18/2016 4:40:43 PM Version By: Ruthine Dose Entered By: Regan Lemming on 12/18/2016 17:14:31 Dunavan, [name] ([id number]) -------------------------------------------------------------------------------- Lower Extremity Assessment Details Patient Name: [name] Date of Service: 12/18/2016 3:45 PM Medical Record Number: [id number] Patient Account Number: [id number] Date of Birth/Sex: 1941-06-15 (76 y.o. Female) Treating RN: Afful, RN, BSN, Velva Harman Primary Care Ermel Verne: Leighton Ruff Other Clinician: Referring Tinea Nobile: Leighton Ruff Treating Jose Alleyne/Extender: Tito Dine in Treatment: 17 Edema Assessment Assessed: [Left: No] [Right: No] Edema: [Left: N] [Right: o] Vascular Assessment Pulses: Dorsalis Pedis Palpable: [Left:Yes] Posterior Tibial Extremity colors, hair growth, and conditions: Extremity Color: [Left:Normal] Hair Growth on Extremity: [Left:No] Temperature of Extremity: [Left:Warm] Capillary Refill: [Left:< 3 seconds] Toe Nail Assessment Left: Right: Thick: Yes Discolored: Yes Deformed: No Improper Length and Hygiene: Yes Electronic Signature(s) Signed: 12/18/2016 5:00:18 PM By: Regan Lemming BSN, RN Entered By: Regan Lemming on 12/18/2016 15:58:27 Schone, [name] ([id number]) -------------------------------------------------------------------------------- Multi Wound Chart  Details Patient Name: [name] Date of Service: 12/18/2016 3:45 PM Medical Record Number: [id number] Patient Account Number: [id number] Date of Birth/Sex: 1941/01/07 (76 y.o. 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] Comorbid  History: [2:Anemia, Coronary Artery N/A Disease, Hypertension, Myocardial Infarction, Type II Diabetes, Osteoarthritis, Neuropathy] Date Acquired: [2:08/12/2016] [N/A:N/A] Weeks of Treatment: [2:17] [N/A:N/A] Wound Status: [2:Open] [N/A:N/A] Measurements L x W x D 0.3x0.5x0.1 [N/A:N/A] (cm) Area (cm) : [2:0.118] [N/A:N/A] Volume (cm) : [2:0.012] [N/A:N/A] % Reduction in Area: [2:98.40%] [N/A:N/A] % Reduction in Volume: 99.20% [N/A:N/A] Classification: [2:Grade 1] [N/A:N/A] Exudate Amount: [2:Large] [N/A:N/A] Exudate Type: [2:Serous] [N/A:N/A] Exudate Color: [2:amber] [N/A:N/A] Foul Odor After [2:Yes] [N/A:N/A] Cleansing: Odor Anticipated Due to No [N/A:N/A] Product Use: Wound Margin: [2:Flat and Intact] [N/A:N/A] Granulation Amount: [2:Large (67-100%)] [N/A:N/A] Granulation Quality: [2:Red] [N/A:N/A] Necrotic Amount: [2:None Present (0%)] [N/A:N/A] Exposed Structures: [2:Fascia: No Fat Layer (Subcutaneous Tissue) Exposed: No Tendon: No Muscle: No Joint: No Bone: No] [N/A:N/A] Limited to Skin Breakdown Epithelialization: Large (67-100%) N/A N/A Periwound Skin Texture: Excoriation: No N/A N/A Induration: No Callus: No Crepitus: No Rash: No Scarring: No Periwound Skin Maceration: No N/A N/A Moisture: Dry/Scaly: No Periwound Skin Color: Atrophie Blanche: No N/A N/A Cyanosis:  No Ecchymosis: No Erythema: No Hemosiderin Staining: No Mottled: No Pallor: No Rubor: No Temperature: No Abnormality N/A N/A Tenderness on Yes N/A N/A Palpation: Wound Preparation: Ulcer Cleansing: N/A N/A Rinsed/Irrigated with Saline, Other: surg scrub and water Topical Anesthetic Applied: None Treatment Notes Electronic Signature(s) Signed: 12/18/2016 5:27:22 PM By: Linton Ham MD Previous Signature: 12/18/2016 5:00:18 PM Version By: Regan Lemming BSN, RN Entered By: Linton Ham on 12/18/2016 17:11:00 [name] ([id number]) -------------------------------------------------------------------------------- Multi-Disciplinary Care Plan Details Patient Name: [name] Date of Service: 12/18/2016 3:45 PM Medical Record Number: [id number] Patient Account Number: [id number] Date of Birth/Sex: 08/13/1940 (76 y.o. Female) Treating RN: Afful, RN, BSN, Velva Harman Primary Care Surya Schroeter: Leighton Ruff Other Clinician: Referring Posey Jasmin: Leighton Ruff Treating Princella Jaskiewicz/Extender: Tito Dine in Treatment: 17 Active Inactive ` Abuse / Safety / Falls / Self Care Management Nursing Diagnoses: Potential for falls Goals: Patient will remain injury free Date Initiated: 08/21/2016 Target Resolution Date: 10/08/2016 Goal Status: Active Interventions: Assess fall risk on admission and as needed Notes: ` Necrotic Tissue Nursing Diagnoses: Impaired tissue integrity related to necrotic/devitalized tissue Goals: Necrotic/devitalized tissue will be minimized in the wound bed Date Initiated: 08/21/2016 Target Resolution Date: 10/08/2016 Goal Status: Active Interventions: Assess patient pain level pre-, during and post procedure and prior to discharge Notes: ` Orientation to the Wound Care Program Nursing Diagnoses: Knowledge deficit related to the wound healing center program MARCEDES, TECH ([id number]) Goals: Patient/caregiver will verbalize  understanding of the [place] Date Initiated: 08/21/2016 Target Resolution Date: 10/08/2016 Goal Status: Active Interventions: Provide education on orientation to the wound center Notes: ` Peripheral Neuropathy Nursing Diagnoses: Potential alteration in peripheral tissue perfusion (select prior to confirmation of diagnosis) Goals: Patient/caregiver will verbalize understanding of disease process and disease management Date Initiated: 08/21/2016 Target Resolution Date: 10/08/2016 Goal Status: Active Interventions: Assess signs and symptoms of neuropathy upon admission and as needed Notes: ` Venous Leg Ulcer Nursing Diagnoses: Actual venous Insuffiency (use after diagnosis is confirmed) Goals: Patient will maintain optimal edema control Date Initiated: 08/21/2016 Target Resolution Date: 10/08/2016 Goal Status: Active Interventions: Assess peripheral edema status every visit. Notes: ` Wound/Skin Impairment Nursing Diagnoses: Impaired tissue integrity Moga, Katira B. ([id number]) Goals: Patient/caregiver will verbalize understanding of skin care regimen Date Initiated: 08/21/2016 Target Resolution Date: 10/08/2016 Goal Status: Active Ulcer/skin breakdown will have a volume reduction of 30% by week 4 Date Initiated: 08/21/2016 Target Resolution Date: 10/08/2016 Goal Status: Active Ulcer/skin breakdown will have a volume  reduction of 50% by week 8 Date Initiated: 08/21/2016 Target Resolution Date: 10/29/2016 Goal Status: Active Ulcer/skin breakdown will have a volume reduction of 80% by week 12 Date Initiated: 08/21/2016 Target Resolution Date: 11/12/2016 Goal Status: Active Ulcer/skin breakdown will heal within 14 weeks Date Initiated: 08/21/2016 Target Resolution Date: 11/12/2016 Goal Status: Active Interventions: Assess patient/caregiver ability to obtain necessary supplies Assess patient/caregiver ability to perform ulcer/skin care regimen upon admission  and as needed Assess ulceration(s) every visit Notes: Electronic Signature(s) Signed: 12/18/2016 5:00:18 PM By: Regan Lemming BSN, RN Entered By: Regan Lemming on 12/18/2016 16:05:40 [name] ([id number]) -------------------------------------------------------------------------------- Pain Assessment Details Patient Name: [name] Date of Service: 12/18/2016 3:45 PM Medical Record Number: [id number] Patient Account Number: [id number] Date of Birth/Sex: 02-Apr-1941 (76 y.o. Female) Treating RN: Afful, RN, BSN, Velva Harman Primary Care Ithiel Liebler: Leighton Ruff Other Clinician: Referring Damaris Abeln: Leighton Ruff Treating Matha Masse/Extender: Tito Dine in Treatment: 17 Active Problems Location of Pain Severity and Description of Pain Patient Has Paino No Site Locations With Dressing Change: No Pain Management and Medication Current Pain Management: Electronic Signature(s) Signed: 12/18/2016 5:00:18 PM By: Regan Lemming BSN, RN Entered By: Regan Lemming on 12/18/2016 15:39:54 Partin, [name] ([id number]) -------------------------------------------------------------------------------- Patient/Caregiver Education Details Patient Name: [name] Date of Service: 12/18/2016 3:45 PM Medical Record Patient Account Number: [id number] Number: Treating RN: Baruch Gouty, RN, BSN, Rita [birth year] (76 y.o. Other Clinician: Date of Birth/Gender: Female) Treating ROBSON, MICHAEL Primary Care Physician: Leighton Ruff Physician/Extender: G Referring Physician: Bethanie Dicker in Treatment: 17 Education Assessment Education Provided To: Patient Education Topics Provided Welcome To [facility]: Methods: Explain/Verbal Wound/Skin Impairment: Methods: Explain/Verbal Responses: State content correctly Electronic Signature(s) Signed: 12/19/2016 3:51:42 PM By: Regan Lemming BSN, RN Entered By: Regan Lemming on 12/18/2016 17:14:47 Shirer, [name]  ([id number]) -------------------------------------------------------------------------------- Wound Assessment Details Patient Name: [name] Date of Service: 12/18/2016 3:45 PM Medical Record Number: [id number] Patient Account Number: [id number] Date of Birth/Sex: 08/19/1940 (76 y.o. Female) Treating RN: Afful, RN, BSN, Administrator, sports Primary Care Cecilie Heidel: Leighton Ruff Other Clinician: Referring Jaiden Dinkins: Leighton Ruff Treating Takoda Janowiak/Extender: Tito Dine in Treatment: 17 Wound Status Wound Number: 2 Primary Diabetic Wound/Ulcer of the Lower Etiology: Extremity Wound Location: Left Calcaneus - Lateral Wound Open Wounding Event: Gradually Appeared Status: Date Acquired: 08/12/2016 Comorbid Anemia, Coronary Artery Disease, Weeks Of Treatment: 17 History: Hypertension, Myocardial Infarction, Clustered Wound: No Type II Diabetes, Osteoarthritis, Neuropathy Photos Photo Uploaded By: Regan Lemming on 12/18/2016 17:36:42 Wound Measurements Length: (cm) 0.3 Width: (cm) 0.5 Depth: (cm) 0.1 Area: (cm) 0.118 Volume: (cm) 0.012 % Reduction in Area: 98.4% % Reduction in Volume: 99.2% Epithelialization: Large (67-100%) Tunneling: No Undermining: No Wound Description Classification: Grade 1 Wound Margin: Flat and Intact Exudate Amount: Large Sthilaire, Solace B. ([id number]) Foul Odor After Cleansing: Yes Due to Product Use: No Slough/Fibrino Yes Exudate Type: Serous Exudate Color: amber Wound Bed Granulation Amount: Large (67-100%) Exposed Structure Granulation Quality: Red Fascia Exposed: No Necrotic Amount: None Present (0%) Fat Layer (Subcutaneous Tissue) Exposed: No Tendon Exposed: No Muscle Exposed: No Joint Exposed: No Bone Exposed: No Limited to Skin Breakdown Periwound Skin Texture Texture Color No Abnormalities Noted: No No Abnormalities Noted: No Callus: No Atrophie Blanche: No Crepitus: No Cyanosis: No Excoriation: No Ecchymosis:  No Induration: No Erythema: No Rash: No Hemosiderin Staining: No Scarring: No Mottled: No Pallor: No Moisture Rubor: No No Abnormalities Noted: No Dry / Scaly: No Temperature / Pain Maceration: No Temperature: No Abnormality Tenderness  on Palpation: Yes Wound Preparation Ulcer Cleansing: Rinsed/Irrigated with Saline, Other: surg scrub and water, Topical Anesthetic Applied: None Treatment Notes Wound #2 (Left, Lateral Calcaneus) 1. Cleansed with: Cleanse wound with antibacterial soap and water 3. Peri-wound Care: Barrier cream Moisturizing lotion 4. Dressing Applied: Promogran 5. Secondary Dressing Applied ABD Pad Notes kerlix and coban wrap DENNISSE, SWADER ([id number]) Electronic Signature(s) Signed: 12/18/2016 5:00:18 PM By: Regan Lemming BSN, RN Entered By: Regan Lemming on 12/18/2016 15:57:52 Voong, [name] ([id number]) -------------------------------------------------------------------------------- Vitals Details Patient Name: [name] Date of Service: 12/18/2016 3:45 PM Medical Record Number: [id number] Patient Account Number: [id number] Date of Birth/Sex: 1941-06-03 (76 y.o. Female) Treating RN: Afful, RN, BSN, Velva Harman Primary Care Margarie Mcguirt: Leighton Ruff Other Clinician: Referring Taziah Difatta: Leighton Ruff Treating Oberon Hehir/Extender: Tito Dine in Treatment: 17 Vital Signs Time Taken: 15:44 Reference Range: 80 - 120 mg / dl Height (in): 63 Weight (lbs): 205 Body Mass Index (BMI): 36.3 Electronic Signature(s) Signed: 12/18/2016 5:00:18 PM By: Regan Lemming BSN, RN Entered By: Regan Lemming on 12/18/2016 15:44:28

## 2016-12-19 NOTE — Patient Outreach (Signed)
[facility]) Care Management  12/19/2016  [name] 04-29-1941 [id number]   Patient was called to follow up on medication assistance. HIPAA identifiers were obtained.  Patient reported she was able to get samples of her insulin.  [facility] emergency funding was used to purchase Lantus for the patient at the end of April.    Patient reported also getting samples of Lantus from her provider.  She said she forgot to ask about Eliquis.  Patient said she will call her provider and ask for Eliquis samples.  A message was sent to [facility] Pharmacy Technician, Talbert Nan to request the patient's TROOP again.  [employer] patient assistance program requires patients to spend 3% of their income on medications.     Plan:  1. Follow up with the patient in 1 week after hearing from [facility] with [facility].   [name], PharmD, [place] Clinical Pharmacist [phone]

## 2016-12-20 ENCOUNTER — Ambulatory Visit: Payer: Self-pay | Admitting: Pharmacist

## 2016-12-20 NOTE — Progress Notes (Signed)
KRIYA, WESTRA ([id number]) Visit Report for 12/18/2016 Chief Complaint Document Details Patient Name: [name] Date of Service: 12/18/2016 3:45 PM Medical Record Patient Account Number: [id number] Number: Treating RN: [name] Gouty, RN, BSN, Rita Sep 06, 1940 (76 y.o. Other Clinician: Date of Birth/Sex: Female) Treating [name] Primary Care Provider: Leighton [name] Provider/Extender: G Referring Provider: Bethanie [name] in Treatment: 17 Information Obtained from: Patient Chief Complaint Patient is here for review of a wound on the left heel Electronic Signature(s) Signed: 12/18/2016 5:27:22 PM By: [name] Ham MD Entered By: [name] on 12/18/2016 17:11:07 Brundidge, [name] ([id number]) -------------------------------------------------------------------------------- HPI Details Patient Name: [name] Date of Service: 12/18/2016 3:45 PM Medical Record Patient Account Number: [id number] Number: Treating RN: [name] Gouty, RN, BSN, Rita [birth year] (76 y.o. Other Clinician: Date of Birth/Sex: Female) Treating [name] Primary Care Provider: Leighton [name] Provider/Extender: G Referring Provider: Bethanie [name] in Treatment: 17 History of Present Illness HPI Description: 08/21/16; [name] is a 76 year old woman who I cared for in our [facility] clinic up until January 2016. At that point she had a wound in her left great toe. 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 He follows with vein and vascular and has a follow-up appointment in early February. She is status left femoral to anterior tibial bypass in February 2016. She is also noted to have venous stasis changes and a history of ulcers. 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 fib on Eliquis. She has bladder cancer and has a chronic urostomy. The patient tells me that 2 weeks ago she had a right prosthesis and a new left diabetic shoe. Roughly one week ago she noted a wound on her left lateral heel. She zinc oxide and some silver alginate she had left over until she could be seen here. It was also noted that she has had blistering on her posterior left calf and an open area here as well. Her records note that she has chronic venous insufficiency with chronic inflammation especially in the left mid calf area. The patient has not been systemically unwell no fever or chills. She is reasonably insensate and has not felt any pain 09/04/16; the patient has a fairly substantial wound on the left lateral calcaneus. We have been using Santyl to this area. Blister on the left lower leg appears to of healed the patient also follows with [name] early of vein and vascular. He felt that she should have enough blood flow to heal her left heel wound. He renewed her doxycycline for a further week. She is status post left femoral to anterior tibial bypass in February 2016. 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 She has a remote history of a right BKA. The XRAY that I ordered last week showed no definite evidence of osteomyelitis however a small focus of demineralization was noted in the plantar aspect of the calcaneus which might warrant an MRI possibly indicating early osteomyelitis however this is not in the area of her wound and I'm going to keep this under advisement for now. 09/11/16; still a substantial wound in the left lateral calcaneus we've been using Santyl 09/18/16; still a substantial wound on  the left lateral calcaneus. We are still using Santyl 09/24/16; still a substantial wound on the left lateral calcaneus. We're using Santyl change one or 2 times  a week and mechanical debridement which he comes to [place]. 10/02/16; left lateral calcaneus using Santyl changing 3 times a week. Run Apligraf through her insurance 10/09/16; still using Santyl without much change. Being changed 2 times a week by the patient and once [name], [name]. ([id number]) here. We did not run Apligraf last week we'll do that this week 10/16/16; the area on her left lateral heel; started Iodoflex last week. Apligraf set a co-pay of $979 per for application 8/92/11; left lateral heel. on iodoflex. approved for appligraft, start next week 10/30/16 Apligraf #1 today 11/13/16- patient is here for follow up evaluation of her left lateral calcaneus ulcer. She had Apligraf #1 placed 2 weeks ago. She is tolerating compression. She is voicing no complaints or concerns. She did return last week for nurse visit. 11/27/16; dramatic improvement in the overall appearance of this wound. What looks to be a tinea infection around this. Apligraf #3 12/11/16; only a small healthy granulated oval wound remains on the lateral aspect of her calcaneus. I think this is probably too small to consider an Apligraf that this point although I gave this some thought. 12/18/16; wound is smaller but still not closed. We'll use Hydrofera Blue last week. Switch to Promogran this week Electronic Signature(s) Signed: 12/18/2016 5:27:22 PM By: [name] Ham MD Entered By: [name] on 12/18/2016 17:11:33 [name], [name] ([id number]) -------------------------------------------------------------------------------- Physical Exam Details Patient Name: [name] Date of Service: 12/18/2016 3:45 PM Medical Record Patient Account Number: [id number] Number: Treating RN: [name] Gouty, RN, BSN, Rita 12/24/1940 (76 y.o. Other Clinician: Date of Birth/Sex: Female) Treating [name] Primary Care Provider: Leighton [name] Provider/Extender: G Referring Provider: Bethanie [name] in  Treatment: 17 Cardiovascular Pedal pulses palpable on the left. Notes Wound exam; continued improvements here. Healthy granulation. No debridement is required. Overall wound volume is reduced. No debridement is required. No evidence of surrounding infection. Electronic Signature(s) Signed: 12/18/2016 5:27:22 PM By: [name] Ham MD Entered By: [name] on 12/18/2016 17:12:19 [name], [name] ([id number]) -------------------------------------------------------------------------------- Physician Orders Details Patient Name: [name] Date of Service: 12/18/2016 3:45 PM Medical Record Patient Account Number: [id number] Number: Treating RN: [name] Gouty, RN, BSN, Rita 01-23-1941 (76 y.o. Other Clinician: Date of Birth/Sex: Female) Treating Jemiah Ellenburg Primary Care Provider: Leighton [name] Provider/Extender: G Referring Provider: Bethanie [name] in Treatment: 71 Verbal / Phone Orders: No Diagnosis Coding Wound Cleansing Wound #2 Left,Lateral Calcaneus o Cleanse wound with mild soap and water o May shower with protection. o No tub bath. Anesthetic Wound #2 Left,Lateral Calcaneus o Topical Lidocaine 4% cream applied to wound bed prior to debridement Skin Barriers/Peri-Wound Care Wound #2 Left,Lateral Calcaneus o Barrier cream Primary Wound Dressing Wound #2 Left,Lateral Calcaneus o Promogran Secondary Dressing Wound #2 Left,Lateral Calcaneus o ABD pad Dressing Change Frequency Wound #2 Left,Lateral Calcaneus o Change dressing every week Follow-up Appointments Wound #2 Left,Lateral Calcaneus o Return Appointment in 1 week. Edema Control Wound #2 Left,Lateral Calcaneus o Kerlix and Coban - Left Lower Extremity - unna to anchor [name], [name]. ([id number]) Off-Loading Wound #2 Left,Lateral Calcaneus o Other: - open heel shoe Additional Orders / Instructions Wound #2 Left,Lateral Calcaneus o Increase protein  intake. Electronic Signature(s) Signed: 12/18/2016 5:00:18 PM By: Regan Lemming BSN, RN Signed: 12/18/2016 5:27:22 PM By: [name] Ham MD Entered By: Regan Lemming on  12/18/2016 16:07:14 [name], [name] ([id number]) -------------------------------------------------------------------------------- Problem List Details Patient Name: KIMBERLYNN, LUMBRA Date of Service: 12/18/2016 3:45 PM Medical Record Patient Account Number: [id number] Number: Treating RN: [name] Gouty, RN, BSN, Rita Jan 02, 1941 (76 y.o. Other Clinician: Date of Birth/Sex: Female) Treating Braedon Sjogren Primary Care Provider: Leighton [name] Provider/Extender: G Referring Provider: Bethanie [name] in Treatment: 17 Active Problems ICD-10 Encounter Code Description Active Date Diagnosis E11.621 Type 2 diabetes mellitus with foot ulcer 08/21/2016 Yes E11.51 Type 2 diabetes mellitus with diabetic peripheral 08/21/2016 Yes angiopathy without gangrene E11.40 Type 2 diabetes mellitus with diabetic neuropathy, 08/21/2016 Yes unspecified L97.423 Non-pressure chronic ulcer of left heel and midfoot with 08/21/2016 Yes necrosis of muscle I87.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 hypertension (idiopathic) with 08/21/2016 Yes inflammation of left lower extremity L97.221 Non-pressure chronic ulcer of left calf limited to 08/21/2016 Yes breakdown of skin L03.116 Cellulitis of left lower limb 08/21/2016 Yes Inactive Problems Resolved Problems Electronic Signature(s) [name], [name] ([id number]) Signed: 12/18/2016 5:27:22 PM By: [name] Ham MD Entered By: [name] on 12/18/2016 17:10:51 [name], [name] ([id number]) -------------------------------------------------------------------------------- Progress Note Details Patient Name: [name] Date of Service: 12/18/2016 3:45 PM Medical Record Patient Account Number: [id number] Number: Treating RN: [name] Gouty, RN, BSN, Rita 11/28/1940 (76 y.o. Other Clinician: Date of  Birth/Sex: Female) Treating Noam Karaffa Primary Care Provider: Leighton [name] Provider/Extender: G Referring Provider: Bethanie [name] in Treatment: 17 Subjective Chief Complaint Information obtained from Patient Patient is here for review of a wound on the left heel History of Present Illness (HPI) 08/21/16; Mrs. Sulton is a 76 year old woman who I cared for in our [facility] clinic up until January 2016. At that point she had a wound in her left great toe. 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 He follows with vein and vascular and has a follow-up appointment in early February. She is status left femoral to anterior tibial bypass in February 2016. She is also noted to have venous stasis changes and a history of ulcers. 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 fib on Eliquis. She has bladder cancer and has a chronic urostomy. The patient tells me that 2 weeks ago she had a right prosthesis and a new left diabetic shoe. Roughly one week ago she noted a wound on her left lateral heel. She zinc oxide and some silver alginate she had left over until she could be seen here. It was also noted that she has had blistering on her posterior left calf and an open area here as well. Her records note that she has chronic venous insufficiency with chronic inflammation especially in the left mid calf area. The patient has not been systemically unwell no fever or chills. She is reasonably insensate and has not felt any pain 09/04/16; the patient has a fairly substantial wound on the left lateral calcaneus. We have been using Santyl to this area. Blister on the left lower leg appears to of  healed the patient also follows with [name] early of vein and vascular. He felt that she should have enough blood flow to heal her left heel wound. He renewed her doxycycline for a further week. She is status post left femoral  to anterior tibial bypass in February 2016. 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 She has a remote history of a right BKA. The XRAY that I ordered last week showed no definite evidence of osteomyelitis however a small focus of demineralization was noted in the plantar aspect of the calcaneus which might warrant an MRI possibly indicating early osteomyelitis however this is not in the area of her wound and I'm going to keep this under advisement for now. 09/11/16; still a substantial wound in the left lateral calcaneus we've been using Santyl [name], [name]. ([id number]) 09/18/16; still a substantial wound on the left lateral calcaneus. We are still using Santyl 09/24/16; still a substantial wound on the left lateral calcaneus. We're using Santyl change one or 2 times a week and mechanical debridement which he comes to [place]. 10/02/16; left lateral calcaneus using Santyl changing 3 times a week. Run Apligraf through her insurance 10/09/16; still using Santyl without much change. Being changed 2 times a week by the patient and once here. We did not run Apligraf last week we'll do that this week 10/16/16; the area on her left lateral heel; started Iodoflex last week. Apligraf set a co-pay of $416 per for application 01/15/29; left lateral heel. on iodoflex. approved for appligraft, start next week 10/30/16 Apligraf #1 today 11/13/16- patient is here for follow up evaluation of her left lateral calcaneus ulcer. She had Apligraf #1 placed 2 weeks ago. She is tolerating compression. She is voicing no complaints or concerns. She did return last week for nurse visit. 11/27/16; dramatic improvement in the overall appearance of this wound. What looks to be a tinea infection around  this. Apligraf #3 12/11/16; only a small healthy granulated oval wound remains on the lateral aspect of her calcaneus. I think this is probably too small to consider an Apligraf that this point although I gave this some thought. 12/18/16; wound is smaller but still not closed. We'll use Hydrofera Blue last week. Switch to Promogran this week Objective Constitutional Vitals Time Taken: 3:44 PM, Height: 63 in, Weight: 205 lbs, BMI: 36.3. Cardiovascular Pedal pulses palpable on the left. General Notes: Wound exam; continued improvements here. Healthy granulation. No debridement is required. Overall wound volume is reduced. No debridement is required. No evidence of surrounding infection. Integumentary (Hair, Skin) Wound #2 status is Open. Original cause of wound was Gradually Appeared. The wound is located on the Left,Lateral Calcaneus. The wound measures 0.3cm length x 0.5cm width x 0.1cm depth; 0.118cm^2 area and 0.012cm^3 volume. The wound is limited to skin breakdown. There is no tunneling or undermining noted. There is a large amount of serous drainage noted. The wound margin is flat and intact. There is large (67-100%) red granulation within the wound bed. There is no necrotic tissue within the wound bed. The periwound skin appearance did not exhibit: Callus, Crepitus, Excoriation, Induration, Rash, Scarring, Dry/Scaly, Maceration, Atrophie Blanche, Cyanosis, Ecchymosis, Hemosiderin Staining, Mottled, Pallor, Rubor, Erythema. Periwound temperature was noted as No Abnormality. The periwound has tenderness on [name], [name]. ([id number]) palpation. 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 No tub bath. Anesthetic: Wound #2 Left,Lateral Calcaneus: Topical Lidocaine 4% cream applied to wound bed prior to debridement Skin Barriers/Peri-Wound Care: Wound #2 Left,Lateral Calcaneus: Barrier cream Primary Wound Dressing: Wound #2 Left,Lateral Calcaneus: Promogran Secondary Dressing: Wound #2 Left,Lateral Calcaneus: ABD pad Dressing Change Frequency: Wound #2 Left,Lateral Calcaneus: Change dressing every week Follow-up Appointments: Wound #2 Left,Lateral Calcaneus: Return Appointment in 1 week. Edema Control: Wound #2 Left,Lateral Calcaneus: Arthurs, [name]. ([id number]) Kerlix and Coban - Left Lower Extremity - unna to anchor Off-Loading: Wound #2 Left,Lateral Calcaneus: Other: - open heel shoe Additional Orders / Instructions: Wound #2 Left,Lateral Calcaneus: Increase protein intake. #1 Promogran hydrogel ABD Kerlix and Coban Electronic Signature(s) Signed: 12/18/2016 5:27:22 PM By: [name] Ham MD Entered By: [name] on 12/18/2016 17:13:11 Gagliano, [name] ([id number]) -------------------------------------------------------------------------------- SuperBill Details Patient Name: [name] Date of Service: 12/18/2016 Medical Record Patient Account Number: [id number] Number: Treating RN: [name] Gouty, RN, BSN, Rita October 04, 1940 (76 y.o. 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 I87.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 L97.221 Non-pressure chronic ulcer of left calf limited to breakdown of skin L03.116 Cellulitis of left lower limb Facility Procedures CPT4 Code: [id number] Description: 40375 - WOUND CARE VISIT-LEV 2 EST PT Modifier: Quantity: 1 Physician Procedures CPT4 Code Description: [id number] 03403 - WC PHYS LEVEL 2 - EST PT ICD-10 Description Diagnosis E11.621 Type 2 diabetes mellitus with foot ulcer L97.423 Non-pressure chronic ulcer of left heel and midfoo Modifier: t with necrosis o Quantity: 1 f muscle Electronic Signature(s) Signed: 12/18/2016 5:27:22 PM By: [name] Ham MD Entered By: [name] on 12/18/2016 17:13:27

## 2016-12-23 ENCOUNTER — Other Ambulatory Visit: Payer: Self-pay | Admitting: Licensed Clinical Social Worker

## 2016-12-23 NOTE — Patient Outreach (Signed)
[facility]) Care Management  12/23/2016  AMERIAH LINT [birth year] [id number]  Assessment- CSW completed outreach call to patient on 12/23/16 and spoke to patient. HIPPA verifications were provided. CSW questioned if she had heard back from [facility] and she reports that she has and that [facility] will be picking her up in one hour and will take her down to their office to complete assessment. Patient confirmed that she has also been in contact with [facility] with [employer]. CSW will follow up within one week to ensure that stable transportation is now in place.  Plan-CSW will follow up within one week and continue to provide social work support.  Eula Fried, BSW, MSW, [email or web address] Phone: [phone] Fax: [phone]

## 2016-12-24 ENCOUNTER — Other Ambulatory Visit: Payer: Self-pay | Admitting: Cardiology

## 2016-12-25 ENCOUNTER — Encounter: Payer: PPO | Admitting: Internal Medicine

## 2016-12-25 DIAGNOSIS — E11621 Type 2 diabetes mellitus with foot ulcer: Secondary | ICD-10-CM | POA: Diagnosis not present

## 2016-12-25 DIAGNOSIS — L97429 Non-pressure chronic ulcer of left heel and midfoot with unspecified severity: Secondary | ICD-10-CM | POA: Diagnosis not present

## 2016-12-26 ENCOUNTER — Other Ambulatory Visit: Payer: Self-pay | Admitting: Pharmacist

## 2016-12-26 NOTE — Patient Outreach (Signed)
[facility]) Care Management  12/26/2016  [name] 1940/08/18 [id number]   Patient was called regarding medication assistance. Unfortunately, she did not answer her phone.  HIPAA compliant message left on the patient's voicemail. TROOP (true-out-of-pocket) spend document was received from [place].  As of 12/17/16, patient's TROOP was $520.40.  Patient's financials will need to be reviewed again as the Eliquis Patient Assistance Program requires patient's to spend at least 3% of household income on medications.  Plan:  I will recall the patient in 1 week.  [name], PharmD, [place] Clinical Pharmacist [phone]

## 2016-12-27 ENCOUNTER — Other Ambulatory Visit: Payer: Self-pay | Admitting: Licensed Clinical Social Worker

## 2016-12-27 NOTE — Progress Notes (Signed)
[name], [name] ([id number]) Visit Report for 12/25/2016 HPI Details Patient Name: [name], [name] Date of Service: 12/25/2016 1:30 PM Medical Record Patient Account Number: [id number] Number: Treating RN: Montey Hora 1940-12-15 (76 y.o. Other Clinician: Date of Birth/Sex: Female) Treating Dauna Ziska Primary Care Provider: Leighton Ruff Provider/Extender: G Referring Provider: Bethanie Dicker in Treatment: 18 History of Present Illness HPI Description: 08/21/16; [name] is a 76 year old woman who I cared for in our [facility] clinic up until January 2016. At that point she had a wound in her left great toe. 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 He follows with vein and vascular and has a follow-up appointment in early February. She is status left femoral to anterior tibial bypass in February 2016. She is also noted to have venous stasis changes and a history of ulcers. 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 fib on Eliquis. She has bladder cancer and has a chronic urostomy. The patient tells me that 2 weeks ago she had a right prosthesis and a new left diabetic shoe. Roughly one week ago she noted a wound on her left lateral heel. She zinc oxide and some silver alginate she had left over until she could be seen here. It was also noted that she has had blistering on her posterior left calf and an open area here as well. Her records note that she has chronic venous insufficiency with chronic inflammation especially in the left mid calf area. The patient has not been systemically unwell no fever or chills. She is reasonably insensate and has not felt any  pain 09/04/16; the patient has a fairly substantial wound on the left lateral calcaneus. We have been using Santyl to this area. Blister on the left lower leg appears to of healed the patient also follows with Dr. Sherren Mocha early of vein and vascular. He felt that she should have enough blood flow to heal her left heel wound. He renewed her doxycycline for a further week. She is status post left femoral to anterior tibial bypass in February 2016. 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 She has a remote history of a right BKA. The XRAY that I ordered last week showed no definite evidence of osteomyelitis however a small focus of demineralization was noted in the plantar aspect of the calcaneus which might warrant an MRI possibly indicating early osteomyelitis however this is not in the area of her wound and I'm going to keep this under advisement for now. 09/11/16; still a substantial wound in the left lateral calcaneus we've been using Santyl 09/18/16; still a substantial wound on the left lateral calcaneus. We are still using Santyl 09/24/16; still a substantial wound on the left lateral calcaneus. We're using Santyl change one or 2 times a Flanary, Bhavana B. ([id number]) week and mechanical debridement which he comes to [place]. 10/02/16; left lateral calcaneus using Santyl changing 3 times a week. Run Apligraf through her insurance 10/09/16; still using Santyl without much change. Being changed 2 times a week by the patient and once here. We did not run Apligraf last week we'll do that this week 10/16/16; the area on her left lateral heel; started Iodoflex  last week. Apligraf set a co-pay of $326 per for application 02/21/44; left lateral heel. on iodoflex. approved for appligraft, start next week 10/30/16 Apligraf #1 today 11/13/16- patient is here for follow up evaluation of her left lateral calcaneus ulcer. She had Apligraf #1 placed 2 weeks ago. She is tolerating compression. She is voicing no  complaints or concerns. She did return last week for nurse visit. 11/27/16; dramatic improvement in the overall appearance of this wound. What looks to be a tinea infection around this. Apligraf #3 12/11/16; only a small healthy granulated oval wound remains on the lateral aspect of her calcaneus. I think this is probably too small to consider an Apligraf that this point although I gave this some thought. 12/18/16; wound is smaller but still not closed. We'll use Hydrofera Blue last week. Switch to Promogran this week 12/25/16 Wound is totally epithelialized. No surrounding erythema. This was originally a pressure injury at the time of new footwear on her righ prosthesis She has PAD for which she follows with Dr. Donnetta Hutching Electronic Signature(s) Signed: 12/25/2016 4:43:34 PM By: Linton Ham MD Entered By: Linton Ham on 12/25/2016 14:22:20 Wrede, [name] ([id number]) -------------------------------------------------------------------------------- Physical Exam Details Patient Name: [name] Date of Service: 12/25/2016 1:30 PM Medical Record Patient Account Number: [id number] Number: Treating RN: Montey Hora 1941-06-13 (76 y.o. Other Clinician: Date of Birth/Sex: Female) Treating Elaynah Virginia Primary Care Provider: Leighton Ruff Provider/Extender: G Referring Provider: Bethanie Dicker in Treatment: 58 Constitutional Patient is hypertensive.. Pulse regular and within target range for patient.Marland Kitchen Respirations regular, non-labored and within target range.. Temperature is normal and within the target range for the patient.. Patient's appearance is neat and clean. Appears in no acute distress. Well nourished and well developed.. Eyes Conjunctivae clear. No discharge.Marland Kitchen Respiratory Respiratory effort is easy and symmetric bilaterally. Rate is normal at rest and on room air.. Cardiovascular DP is palpable. leg leg edema is controlled under our wraps.  Venous inflammation. Lymphatic none palpable in the left popliteal or inguinal area. Musculoskeletal previous right BKA and left first ray amputaton. Notes Wound exam; continued improvement. wound is totally epithelialized. Electronic Signature(s) Signed: 12/25/2016 4:43:34 PM By: Linton Ham MD Entered By: Linton Ham on 12/25/2016 14:41:54 Rufino, [name] ([id number]) -------------------------------------------------------------------------------- Physician Orders Details Patient Name: [name] Date of Service: 12/25/2016 1:30 PM Medical Record Patient Account Number: [id number] Number: Treating RN: Montey Hora 1940-09-14 (76 y.o. Other Clinician: Date of Birth/Sex: Female) Treating Cathyann Kilfoyle Primary Care Provider: Leighton Ruff Provider/Extender: G Referring Provider: Bethanie Dicker in Treatment: 75 Verbal / Phone Orders: Yes Clinician: Montey Hora Read Back and Verified: Yes Diagnosis Coding Discharge From [facility] Services o Discharge from [place] area clean and dry. Keep area covered and protected. Please call or contact or office if you have any questions or concerns. Electronic Signature(s) Signed: 12/25/2016 4:43:34 PM By: Linton Ham MD Signed: 12/25/2016 4:59:52 PM By: Montey Hora Entered By: Montey Hora on 12/25/2016 13:59:19 Duman, [name] ([id number]) -------------------------------------------------------------------------------- Problem List Details Patient Name: [name] Date of Service: 12/25/2016 1:30 PM Medical Record Patient Account Number: [id number] Number: Treating RN: Montey Hora Jan 04, 1941 (76 y.o. Other Clinician: Date of Birth/Sex: Female) Treating Chenise Mulvihill Primary Care Provider: Leighton Ruff Provider/Extender: G Referring Provider: Bethanie Dicker in Treatment: 62 Active Problems ICD-10 Encounter Code Description Active  Date Diagnosis E11.621 Type 2 diabetes mellitus with foot ulcer 08/21/2016 Yes E11.51 Type 2 diabetes mellitus with diabetic peripheral 08/21/2016  Yes angiopathy without gangrene E11.40 Type 2 diabetes mellitus with diabetic neuropathy, 08/21/2016 Yes unspecified L97.423 Non-pressure chronic ulcer of left heel and midfoot with 08/21/2016 Yes necrosis of muscle I87.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 hypertension (idiopathic) with 08/21/2016 Yes inflammation of left lower extremity L97.221 Non-pressure chronic ulcer of left calf limited to 08/21/2016 Yes breakdown of skin L03.116 Cellulitis of left lower limb 08/21/2016 Yes Inactive Problems Resolved Problems Electronic Signature(s) KINZLY, PIERRELOUIS ([id number]) Signed: 12/25/2016 4:43:34 PM By: Linton Ham MD Entered By: Linton Ham on 12/25/2016 14:18:59 Beadles, [name] ([id number]) -------------------------------------------------------------------------------- Progress Note Details Patient Name: [name] Date of Service: 12/25/2016 1:30 PM Medical Record Patient Account Number: [id number] Number: Treating RN: Montey Hora [birth year] (76 y.o. Other Clinician: Date of Birth/Sex: Female) Treating Marcus Schwandt Primary Care Provider: Leighton Ruff Provider/Extender: G Referring Provider: Bethanie Dicker in Treatment: 18 Subjective History of Present Illness (HPI) 08/21/16; Mrs. Dunk is a 76 year old woman who I cared for in our [facility] clinic up until January 2016. At that point she had a wound in her left great toe. 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 He follows with vein and vascular and has a follow-up appointment in early February. She is status left femoral to anterior tibial bypass in February 2016. She is also noted to have  venous stasis changes and a history of ulcers. 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 fib on Eliquis. She has bladder cancer and has a chronic urostomy. The patient tells me that 2 weeks ago she had a right prosthesis and a new left diabetic shoe. Roughly one week ago she noted a wound on her left lateral heel. She zinc oxide and some silver alginate she had left over until she could be seen here. It was also noted that she has had blistering on her posterior left calf and an open area here as well. Her records note that she has chronic venous insufficiency with chronic inflammation especially in the left mid calf area. The patient has not been systemically unwell no fever or chills. She is reasonably insensate and has not felt any pain 09/04/16; the patient has a fairly substantial wound on the left lateral calcaneus. We have been using Santyl to this area. Blister on the left lower leg appears to of healed the patient also follows with Dr. Sherren Mocha early of vein and vascular. He felt that she should have enough blood flow to heal her left heel wound. He renewed her doxycycline for a further week. She is status post left femoral to anterior tibial bypass in February 2016. 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 She has a remote history of a right BKA. The XRAY that I ordered last week showed no definite evidence of osteomyelitis however a small focus of demineralization was noted in the plantar aspect of the calcaneus which might warrant an MRI possibly indicating early osteomyelitis however this is not in the area of her wound and I'm going to keep this under advisement for now. 09/11/16; still a substantial wound in the left lateral calcaneus we've been using Santyl 09/18/16; still a substantial wound on the left lateral calcaneus. We are still using  Santyl  09/24/16; still a substantial wound on the left lateral calcaneus. We're using Santyl change one or 2 times a week and mechanical debridement which he comes to [place]. 10/02/16; left lateral calcaneus using Santyl changing 3 times a week. Run Apligraf through her insurance 10/09/16; still using Santyl without much change. Being changed 2 times a week by the patient and once [place], Justiss B. ([id number]) here. We did not run Apligraf last week we'll do that this week 10/16/16; the area on her left lateral heel; started Iodoflex last week. Apligraf set a co-pay of $299 per for application 3/71/69; left lateral heel. on iodoflex. approved for appligraft, start next week 10/30/16 Apligraf #1 today 11/13/16- patient is here for follow up evaluation of her left lateral calcaneus ulcer. She had Apligraf #1 placed 2 weeks ago. She is tolerating compression. She is voicing no complaints or concerns. She did return last week for nurse visit. 11/27/16; dramatic improvement in the overall appearance of this wound. What looks to be a tinea infection around this. Apligraf #3 12/11/16; only a small healthy granulated oval wound remains on the lateral aspect of her calcaneus. I think this is probably too small to consider an Apligraf that this point although I gave this some thought. 12/18/16; wound is smaller but still not closed. We'll use Hydrofera Blue last week. Switch to Promogran this week 12/25/16 Wound is totally epithelialized. No surrounding erythema. This was originally a pressure injury at the time of new footwear on her righ prosthesis She has PAD for which she follows with Dr. Donnetta Hutching Objective Constitutional Patient is hypertensive.. Pulse regular and within target range for patient.Marland Kitchen Respirations regular, non-labored and within target range.. Temperature is normal and within the target range for the patient.. Patient's appearance is neat and clean. Appears in no acute distress. Well nourished and well  developed.. Vitals Time Taken: 1:28 PM, Height: 63 in, Weight: 205 lbs, BMI: 36.3, Temperature: 97.7 F, Pulse: 62 bpm, Respiratory Rate: 16 breaths/min, Blood Pressure: 142/54 mmHg. Eyes Conjunctivae clear. No discharge.Marland Kitchen Respiratory Respiratory effort is easy and symmetric bilaterally. Rate is normal at rest and on room air.. Cardiovascular DP is palpable. leg leg edema is controlled under our wraps. Venous inflammation. Lymphatic none palpable in the left popliteal or inguinal area. Musculoskeletal previous right BKA and left first ray amputaton. MACYN, SHROPSHIRE B. ([id number]) General Notes: Wound exam; continued improvement. wound is totally epithelialized. Integumentary (Hair, Skin) Wound #2 status is Open. Original cause of wound was Gradually Appeared. The wound is located on the Left,Lateral Calcaneus. The wound measures 0cm length x 0cm width x 0cm depth; 0cm^2 area and 0cm^3 volume. The wound is limited to skin breakdown. There is no tunneling or undermining noted. There is a none present amount of drainage noted. The wound margin is flat and intact. There is no granulation within the wound bed. There is no necrotic tissue within the wound bed. The periwound skin appearance did not exhibit: Callus, Crepitus, Excoriation, Induration, Rash, Scarring, Dry/Scaly, Maceration, Atrophie Blanche, Cyanosis, Ecchymosis, Hemosiderin Staining, Mottled, Pallor, Rubor, Erythema. Periwound temperature was noted as No Abnormality. The periwound has tenderness on palpation. 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 Keep area covered and protected. Please call or contact or office if you have any questions or concerns. foam to the lateral left heal qd Patient will use our healing sandal Levels, Shellene B. ([id number]) have suggested support stocking on the left leg. She also has zippered stocking and a compresson stocking she will wait for 1-1months before considering new shoes. careful serial observation of the left foot she can be discharged Electronic Signature(s) Signed: 12/25/2016 4:43:34 PM By: Linton Ham MD Entered By: Linton Ham on 12/25/2016 14:44:40 Schlereth, [name] ([id number]) -------------------------------------------------------------------------------- SuperBill Details Patient Name: [name] Date of Service: 12/25/2016 Medical Record Patient Account Number: [id number] Number: Treating RN: Montey Hora November 17, 1940 (76 y.o. 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 I87.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 L97.221 Non-pressure chronic ulcer of left calf limited to breakdown of skin L03.116 Cellulitis of left lower limb Facility Procedures CPT4 Code: [id number] Description: 60109 - WOUND CARE VISIT-LEV  2 EST PT Modifier: Quantity: 1 Physician Procedures CPT4 Code Description: [id number] 22025 - WC PHYS LEVEL 3 - EST PT ICD-10 Description Diagnosis E11.621 Type 2 diabetes mellitus with foot ulcer L97.423 Non-pressure chronic ulcer of left heel and midfoo Modifier: t with necrosis Quantity: 1 of muscle Electronic Signature(s) Signed: 12/25/2016 4:43:34 PM By: Linton Ham MD Entered By: Linton Ham on 12/25/2016 14:45:01

## 2016-12-27 NOTE — Patient Outreach (Signed)
[name]) Care Management  12/27/2016  [name] 11-06-1940 [id number]  Assessment-CSW completed outreach attempt today to follow up on SCAT and gaining a ramp through [employer]. CSW unable to reach patient successfully. CSW left a HIPPA compliant voice message encouraging patient to return call once available.  Plan-CSW will await return call or complete an additional outreach if needed.  Eula Fried, BSW, MSW, [email or web address] Phone: [phone] Fax: [phone]

## 2016-12-27 NOTE — Progress Notes (Signed)
MORGAINE, KIMBALL ([id number]) Visit Report for 12/25/2016 Arrival Information Details Patient Name: [name], [name] Date of Service: 12/25/2016 1:30 PM Medical Record Number: [id number] Patient Account Number: [id number] Date of Birth/Sex: 03/25/1941 (76 y.o. Female) Treating RN: Montey Hora Primary Care Esty Ahuja: Leighton Ruff Other Clinician: Referring Ambriella Kitt: Leighton Ruff Treating Sheina Mcleish/Extender: Tito Dine in Treatment: 18 Visit Information History Since Last Visit Added or deleted any medications: No Patient Arrived: Wheel Chair Any new allergies or adverse reactions: No Arrival Time: 13:26 Had a fall or experienced change in No Accompanied By: friend activities of daily living that may affect Transfer Assistance: None risk of falls: Patient Identification Verified: Yes Signs or symptoms of abuse/neglect since last No Secondary Verification Process Yes visito Completed: Hospitalized since last visit: No Patient Has Alerts: Yes Has Dressing in Place as Prescribed: Yes Patient Alerts: Patient on Blood Has Compression in Place as Prescribed: Yes Thinner Pain Present Now: No eliquis DMII ABI [place] >220 Electronic Signature(s) Signed: 12/25/2016 4:59:52 PM By: Montey Hora Entered By: Montey Hora on 12/25/2016 13:26:17 Choplin, [name] ([id number]) -------------------------------------------------------------------------------- Clinic Level of Care Assessment Details Patient Name: [name] Date of Service: 12/25/2016 1:30 PM Medical Record Number: [id number] Patient Account Number: [id number] Date of Birth/Sex: 23-Mar-1941 (76 y.o. Female) Treating RN: Montey Hora Primary Care Johnanna Bakke: Leighton Ruff Other Clinician: Referring Mihira Tozzi: Leighton Ruff Treating Louan Base/Extender: Tito Dine in Treatment: 18 Clinic Level of Care Assessment Items TOOL 4 Quantity Score X - Use when only an EandM is  performed on FOLLOW-UP visit 1 0 ASSESSMENTS - Nursing Assessment / Reassessment X - Reassessment of Co-morbidities (includes updates in patient status) 1 10 X - Reassessment of Adherence to Treatment Plan 1 5 ASSESSMENTS - Wound and Skin Assessment / Reassessment X - Simple Wound Assessment / Reassessment - one wound 1 5 []  - Complex Wound Assessment / Reassessment - multiple wounds 0 []  - Dermatologic / Skin Assessment (not related to wound area) 0 ASSESSMENTS - Focused Assessment []  - Circumferential Edema Measurements - multi extremities 0 []  - Nutritional Assessment / Counseling / Intervention 0 []  - Lower Extremity Assessment (monofilament, tuning fork, pulses) 0 []  - Peripheral Arterial Disease Assessment (using hand held doppler) 0 ASSESSMENTS - Ostomy and/or Continence Assessment and Care []  - Incontinence Assessment and Management 0 []  - Ostomy Care Assessment and Management (repouching, etc.) 0 PROCESS - Coordination of Care X - Simple Patient / Family Education for ongoing care 1 15 []  - Complex (extensive) Patient / Family Education for ongoing care 0 []  - Staff obtains Programmer, systems, Records, Test Results / Process Orders 0 []  - Staff telephones HHA, Nursing Homes / Clarify orders / etc 0 []  - Routine Transfer to another Facility (non-emergent condition) 0 Betten, Christyl B. ([id number]) []  - Routine Hospital Admission (non-emergent condition) 0 []  - New Admissions / Biomedical engineer / Ordering NPWT, Apligraf, etc. 0 []  - Emergency Hospital Admission (emergent condition) 0 X - Simple Discharge Coordination 1 10 []  - Complex (extensive) Discharge Coordination 0 PROCESS - Special Needs []  - Pediatric / Minor Patient Management 0 []  - Isolation Patient Management 0 []  - Hearing / Language / Visual special needs 0 []  - Assessment of Community assistance (transportation, D/C planning, etc.) 0 []  - Additional assistance / Altered mentation 0 []  - Support Surface(s)  Assessment (bed, cushion, seat, etc.) 0 INTERVENTIONS - Wound Cleansing / Measurement X - Simple Wound Cleansing - one wound 1 5 []  - Complex Wound Cleansing - multiple  wounds 0 X - Wound Imaging (photographs - any number of wounds) 1 5 []  - Wound Tracing (instead of photographs) 0 []  - Simple Wound Measurement - one wound 0 []  - Complex Wound Measurement - multiple wounds 0 INTERVENTIONS - Wound Dressings X - Small Wound Dressing one or multiple wounds 1 10 []  - Medium Wound Dressing one or multiple wounds 0 []  - Large Wound Dressing one or multiple wounds 0 []  - Application of Medications - topical 0 []  - Application of Medications - injection 0 INTERVENTIONS - Miscellaneous []  - External ear exam 0 Bowdish, Rosalind B. ([id number]) []  - Specimen Collection (cultures, biopsies, blood, body fluids, etc.) 0 []  - Specimen(s) / Culture(s) sent or taken to Lab for analysis 0 []  - Patient Transfer (multiple staff / Harrel Lemon Lift / Similar devices) 0 []  - Simple Staple / Suture removal (25 or less) 0 []  - Complex Staple / Suture removal (26 or more) 0 []  - Hypo / Hyperglycemic Management (close monitor of Blood Glucose) 0 []  - Ankle / Brachial Index (ABI) - do not check if billed separately 0 X - Vital Signs 1 5 Has the patient been seen at the hospital within the last three years: Yes Total Score: 70 Level Of Care: New/Established - Level 2 Electronic Signature(s) Signed: 12/25/2016 4:59:52 PM By: Montey Hora Entered By: Montey Hora on 12/25/2016 14:01:18 Lehrmann, [name] ([id number]) -------------------------------------------------------------------------------- Encounter Discharge Information Details Patient Name: [name] Date of Service: 12/25/2016 1:30 PM Medical Record Number: [id number] Patient Account Number: [id number] Date of Birth/Sex: [birth year] (76 y.o. Female) Treating RN: Montey Hora Primary Care Missi Mcmackin: Leighton Ruff Other Clinician: Referring  Jasey Cortez: Leighton Ruff Treating Zenith Kercheval/Extender: Tito Dine in Treatment: 79 Encounter Discharge Information Items Discharge Pain Level: 0 Discharge Condition: Stable Ambulatory Status: Wheelchair Discharge Destination: Home Transportation: Private Auto Accompanied By: friend Schedule Follow-up Appointment: No Medication Reconciliation completed No and provided to Patient/Care Danyell Shader: Patient Clinical Summary of Care: Declined Electronic Signature(s) Signed: 12/25/2016 2:14:58 PM By: Ruthine Dose Entered By: Ruthine Dose on 12/25/2016 14:14:58 Hegel, [name] ([id number]) -------------------------------------------------------------------------------- Lower Extremity Assessment Details Patient Name: [name] Date of Service: 12/25/2016 1:30 PM Medical Record Number: [id number] Patient Account Number: [id number] Date of Birth/Sex: Jul 03, 1941 (76 y.o. Female) Treating RN: Montey Hora Primary Care Lilliemae Fruge: Leighton Ruff Other Clinician: Referring Daniella Dewberry: Leighton Ruff Treating Blonnie Maske/Extender: Tito Dine in Treatment: 18 Vascular Assessment Pulses: Dorsalis Pedis Palpable: [Left:Yes] Posterior Tibial Extremity colors, hair growth, and conditions: Extremity Color: [Left:Hyperpigmented] Hair Growth on Extremity: [Left:No] Temperature of Extremity: [Left:Warm] Capillary Refill: [Left:< 3 seconds] Electronic Signature(s) Signed: 12/25/2016 4:59:52 PM By: Montey Hora Entered By: Montey Hora on 12/25/2016 13:43:42 Robleto, [name] ([id number]) -------------------------------------------------------------------------------- Multi Wound Chart Details Patient Name: [name] Date of Service: 12/25/2016 1:30 PM Medical Record Number: [id number] Patient Account Number: [id number] Date of Birth/Sex: 07-21-1941 (76 y.o. 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] Comorbid History: [2:Anemia, Coronary Artery N/A Disease, Hypertension, Myocardial Infarction, Type II Diabetes, Osteoarthritis, Neuropathy] Date Acquired: [2:08/12/2016] [N/A:N/A] Weeks of Treatment: [2:18] [N/A:N/A] Wound Status: [2:Open] [N/A:N/A] Measurements L x W  x D 0x0x0 [N/A:N/A] (cm) Area (cm) : [2:0] [N/A:N/A] Volume (cm) : [2:0] [N/A:N/A] % Reduction in Area: [2:100.00%] [N/A:N/A] % Reduction in Volume: 100.00% [N/A:N/A] Classification: [2:Grade 1] [N/A:N/A] Exudate Amount: [2:None Present] [N/A:N/A] Wound Margin: [2:Flat and Intact] [N/A:N/A] Granulation Amount: [2:None Present (0%)] [N/A:N/A] Necrotic Amount: [2:None Present (0%)] [N/A:N/A] Exposed Structures: [2:Fascia: No Fat Layer (Subcutaneous Tissue) Exposed: No Tendon: No Muscle: No Joint: No] [N/A:N/A] Bone: No Limited to Skin Breakdown Epithelialization: Large (67-100%) N/A N/A Periwound Skin Texture: Excoriation: No N/A N/A Induration: No Callus: No Crepitus: No Rash: No Scarring: No Periwound Skin Maceration: No N/A N/A Moisture: Dry/Scaly: No Periwound Skin Color: Atrophie Blanche: No N/A N/A Cyanosis: No Ecchymosis: No Erythema: No Hemosiderin Staining: No Mottled: No Pallor: No Rubor: No Temperature: No Abnormality N/A N/A Tenderness on Yes N/A N/A Palpation: Wound Preparation: Ulcer Cleansing: N/A N/A Rinsed/Irrigated with Saline, Other: surg scrub and water Topical Anesthetic Applied:  None Treatment Notes Electronic Signature(s) Signed: 12/25/2016 4:43:34 PM By: Linton Ham MD Entered By: Linton Ham on 12/25/2016 14:19:06 Gunderman, [name] ([id number]) -------------------------------------------------------------------------------- Multi-Disciplinary Care Plan Details Patient Name: [name] Date of Service: 12/25/2016 1:30 PM Medical Record Number: [id number] Patient Account Number: [id number] Date of Birth/Sex: Jul 02, 1941 (76 y.o. Female) Treating RN: Montey Hora Primary Care Tuyen Uncapher: Leighton Ruff Other Clinician: Referring Lanice Folden: Leighton Ruff Treating Gwendlyn Hanback/Extender: Tito Dine in Treatment: 18 Active Inactive Electronic Signature(s) Signed: 12/25/2016 4:59:52 PM By: Montey Hora Entered By: Montey Hora on 12/25/2016 14:00:34 Dalsanto, [name] ([id number]) -------------------------------------------------------------------------------- Pain Assessment Details Patient Name: [name] Date of Service: 12/25/2016 1:30 PM Medical Record Number: [id number] Patient Account Number: [id number] Date of Birth/Sex: [birth year] (76 y.o. Female) Treating RN: Montey Hora Primary Care Eshan Trupiano: Leighton Ruff Other Clinician: Referring Taniyah Ballow: Leighton Ruff Treating Severus Brodzinski/Extender: Tito Dine in Treatment: 18 Active Problems Location of Pain Severity and Description of Pain Patient Has Paino No Site Locations Pain Management and Medication Current Pain Management: Notes Topical or injectable lidocaine is offered to patient for acute pain when surgical debridement is performed. If needed, Patient is instructed to use over the counter pain medication for the following 24-48 hours after debridement. Wound care MDs do not prescribed pain medications. Patient has chronic pain or uncontrolled pain. Patient has been instructed to make an appointment with their Primary Care Physician for  pain management. Electronic Signature(s) Signed: 12/25/2016 4:59:52 PM By: Montey Hora Entered By: Montey Hora on 12/25/2016 13:26:26 [name] ([id number]) -------------------------------------------------------------------------------- Patient/Caregiver Education Details Patient Name: [name] Date of Service: 12/25/2016 1:30 PM Medical Record Patient Account Number: [id number] Number: Treating RN: Montey Hora 10-30-1940 (76 y.o. Other Clinician: Date of Birth/Gender: Female) Treating ROBSON, MICHAEL Primary Care Physician: Leighton Ruff Physician/Extender: G Referring Physician: Bethanie Dicker in Treatment: 39 Education Assessment Education Provided To: Patient Education Topics Provided Wound/Skin Impairment: Handouts: Other: Keep area clean and dry. Keep area covered and protected. Methods: Explain/Verbal Responses: State content correctly Electronic Signature(s) Signed: 12/25/2016 4:59:52 PM By: Montey Hora Entered By: Montey Hora on 12/25/2016 13:59:58 Iddings, [name] ([id number]) -------------------------------------------------------------------------------- Wound Assessment Details Patient Name: [name] Date of Service: 12/25/2016 1:30 PM Medical Record Number: [id number] Patient Account Number: [id number] Date of Birth/Sex: 10-24-1940 (76 y.o. Female) Treating RN: Montey Hora Primary Care Ysmael Hires: Leighton Ruff Other Clinician: Referring Gurnie Duris: Leighton Ruff Treating Ayn Domangue/Extender: Tito Dine in Treatment: 18 Wound Status Wound Number: 2 Primary Diabetic Wound/Ulcer of the Lower Etiology: Extremity Wound Location: Left Calcaneus -  Lateral Wound Open Wounding Event: Gradually Appeared Status: Date Acquired: 08/12/2016 Comorbid Anemia, Coronary Artery Disease, Weeks Of Treatment: 18 History: Hypertension, Myocardial Infarction, Clustered Wound: No Type II Diabetes,  Osteoarthritis, Neuropathy Photos Photo Uploaded By: Montey Hora on 12/25/2016 16:58:19 Wound Measurements Length: (cm) 0 % Reduction in Width: (cm) 0 % Reduction in Depth: (cm) 0 Epithelializat Area: (cm) 0 Tunneling: Volume: (cm) 0 Undermining: Area: 100% Volume: 100% ion: Large (67-100%) No No Wound Description Classification: Grade 1 Foul Odor Afte Wound Margin: Flat and Intact Slough/Fibrino Exudate Amount: None Present r Cleansing: No No Wound Bed Granulation Amount: None Present (0%) Exposed Structure Necrotic Amount: None Present (0%) Fascia Exposed: No Fat Layer (Subcutaneous Tissue) Exposed: No Tendon Exposed: No Muscle Exposed: No Garling, Sadeen B. ([id number]) Joint Exposed: No Bone Exposed: No Limited to Skin Breakdown Periwound Skin Texture Texture Color No Abnormalities Noted: No No Abnormalities Noted: No Callus: No Atrophie Blanche: No Crepitus: No Cyanosis: No Excoriation: No Ecchymosis: No Induration: No Erythema: No Rash: No Hemosiderin Staining: No Scarring: No Mottled: No Pallor: No Moisture Rubor: No No Abnormalities Noted: No Dry / Scaly: No Temperature / Pain Maceration: No Temperature: No Abnormality Tenderness on Palpation: Yes Wound Preparation Ulcer Cleansing: Rinsed/Irrigated with Saline, Other: surg scrub and water, Topical Anesthetic Applied: None Electronic Signature(s) Signed: 12/25/2016 4:59:52 PM By: Montey Hora Entered By: Montey Hora on 12/25/2016 13:57:39 Bluestein, [name] ([id number]) -------------------------------------------------------------------------------- Vitals Details Patient Name: [name] Date of Service: 12/25/2016 1:30 PM Medical Record Number: [id number] Patient Account Number: [id number] Date of Birth/Sex: [birth year] (76 y.o. Female) Treating RN: Montey Hora Primary Care Skyy Mcknight: Leighton Ruff Other Clinician: Referring Martavion Couper: Leighton Ruff Treating  Tameah Mihalko/Extender: Tito Dine in Treatment: 18 Vital Signs Time Taken: 13:28 Temperature (F): 97.7 Height (in): 63 Pulse (bpm): 62 Weight (lbs): 205 Respiratory Rate (breaths/min): 16 Body Mass Index (BMI): 36.3 Blood Pressure (mmHg): 142/54 Reference Range: 80 - 120 mg / dl Electronic Signature(s) Signed: 12/25/2016 4:59:52 PM By: Montey Hora Entered By: Montey Hora on 12/25/2016 13:28:30

## 2016-12-29 ENCOUNTER — Other Ambulatory Visit: Payer: Self-pay | Admitting: Cardiology

## 2016-12-30 MED ORDER — AMIODARONE HCL 200 MG PO TABS: ORAL_TABLET | ORAL | 11 refills | Status: AC

## 2016-12-30 NOTE — Addendum Note (Signed)
Addended by: Derl Barrow on: 12/30/2016 12:42 PM   Modules accepted: Orders

## 2016-12-31 ENCOUNTER — Other Ambulatory Visit: Payer: Self-pay | Admitting: *Deleted

## 2016-12-31 ENCOUNTER — Ambulatory Visit: Payer: Self-pay | Admitting: *Deleted

## 2016-12-31 NOTE — Patient Outreach (Signed)
Pt called today to request home visit cancellation and wrap up of her case over the phone. She reports her daughter is in the hospital and very ill and she is going back and forth to the hospital.  She reports she is doing well. Her foot wound is finally healed with her continued care from the wound center, improved glucose control and elevating her leg as much as possible.  I have reminded her what it took to heal her foot and that she needs to follow her diabetic diet, meds, activity and wellness checks to maintain her health. We discussed that the outcome could have been much different with the end result of her loosing her L foot.  I have reinterated that [facility] is available to her in the future for any needs and to keep [place] in mind.  [name] [facility] [place] [phone]

## 2017-01-01 ENCOUNTER — Ambulatory Visit: Payer: PPO | Admitting: Internal Medicine

## 2017-01-01 ENCOUNTER — Other Ambulatory Visit: Payer: Self-pay | Admitting: Pharmacist

## 2017-01-01 ENCOUNTER — Other Ambulatory Visit: Payer: Self-pay | Admitting: Cardiology

## 2017-01-01 NOTE — Patient Outreach (Signed)
[place] [facility]) Care Management  01/01/2017  [name] 03-16-1941 [id number]   Called patient to follow up on Medication Assistance.  No answer. HIPAA compliant message was left on the patient's voicemail.  Most recent TROOP received from [facility]. Depending on updated financial information from the patient, she may have spent the necessary 3% of her income to be eligible to receive Eliquis from the Patient Assistance Program.  Plan:  Recall the patient in 1 week.  [name], PharmD, [place] Clinical Pharmacist [phone]

## 2017-01-02 ENCOUNTER — Ambulatory Visit: Payer: Self-pay | Admitting: *Deleted

## 2017-01-02 ENCOUNTER — Ambulatory Visit: Payer: Self-pay | Admitting: Pharmacist

## 2017-01-07 ENCOUNTER — Other Ambulatory Visit: Payer: Self-pay | Admitting: Licensed Clinical Social Worker

## 2017-01-07 NOTE — Patient Outreach (Signed)
[place] [facility]) Care Management  01/07/2017  EMRI SAMPLE [birth year] [id number]  Assessment- CSW completed outreach call to patient and she answered. She reports that her daughter was hospitalized and is now at [facility]. She shares that her daughter will be there for 19 more days but that she is already doing a lot better. Patient shares that her foot wound has healed. Patient now has stable transportation through [place] but patient shares that the [employer] driver did not pull up that far in her driveway which created a barrier for her to get to the bus. Patient is agreeable to contacting [employer] and inquiring about this concern. Patient also shares [employer] with [employer] informed her that she would be getting a phone call from someone named Clair Gulling but patient never received that call. CSW provided [employer] number to patient again and encouraged her to update [employer] about this. Patient reports that she was told that she should be gaining a ramp by July of this year.   Plan-CSW will follow up within two-three weeks and continue to provide social work support.  Eula Fried, BSW, MSW, [email or web address] Phone: [phone] Fax: [phone]

## 2017-01-14 ENCOUNTER — Other Ambulatory Visit: Payer: Self-pay | Admitting: Licensed Clinical Social Worker

## 2017-01-14 NOTE — Patient Outreach (Signed)
[place] [facility]) Care Management  01/14/2017  [name] 1940/09/11 [id number]  Assessment- CSW completed call to patient and she answered appropriately. She shares that she is doing well and that she is going to pick up her daughter today from [facility] because her daughter is ready to return back home. Patient now receives [employer] services. She is hopeful that they will pick her up closer to the door next time and she plans to contact [employer] when she has a chance. CSW questioned if she had contacted [employer] with [employer] and she declined. She reports that she has been very busy but ensures CSW that she can coordinate care with [employer] on her own and has their number saved in her phone. CSW completed brief review of community resources. Patient is appreciative of social work support and denies any further social work needs. Patient is agreeable to social work discharge at this time.   Plan-CSW will inform [facility] Pharmacist and PCP of social work discharge and will complete discharge at this time.  [facility] CM Care Plan Problem One     Most Recent Value  Care Plan Problem One  Lack of stable transportation and resources  Role Documenting the Problem One  Clinical Social Worker  Care Plan for Problem One  Active  [facility] Long Term Goal   Patient will gain a stable ramp within 90 days per patient or family report  [facility] Long Term Goal Start Date  12/06/16  [facility] Long Term Goal Met Date  01/14/17  Interventions for Problem One Long Term Goal  Referral has been completed and patient is agreeable to following up with [employer] on her own.  [facility] CM Short Term Goal #1   Patient will gain [employer] services within 30 days per patient or family report  [facility] CM Short Term Goal #1 Start Date  12/06/16  [facility] CM Short Term Goal #1 Met Date  01/14/17  Interventions for Short Term Goal #1  Goal met.     Eula Fried, BSW, MSW, [email or web address] Phone: [phone] Fax: [phone]

## 2017-01-15 DIAGNOSIS — N390 Urinary tract infection, site not specified: Secondary | ICD-10-CM | POA: Diagnosis not present

## 2017-01-15 DIAGNOSIS — N183 Chronic kidney disease, stage 3 (moderate): Secondary | ICD-10-CM | POA: Diagnosis not present

## 2017-01-17 ENCOUNTER — Ambulatory Visit (HOSPITAL_COMMUNITY)
Admission: RE | Admit: 2017-01-17 | Discharge: 2017-01-17 | Disposition: A | Discharge status: Home / Self Care | Payer: PPO | Source: Ambulatory Visit | Attending: Nephrology | Admitting: Nephrology

## 2017-01-17 ENCOUNTER — Other Ambulatory Visit (HOSPITAL_COMMUNITY): Payer: Self-pay | Admitting: Nephrology

## 2017-01-17 DIAGNOSIS — N184 Chronic kidney disease, stage 4 (severe): Secondary | ICD-10-CM

## 2017-01-17 DIAGNOSIS — I129 Hypertensive chronic kidney disease with stage 1 through stage 4 chronic kidney disease, or unspecified chronic kidney disease: Secondary | ICD-10-CM | POA: Diagnosis not present

## 2017-01-17 DIAGNOSIS — N185 Chronic kidney disease, stage 5: Secondary | ICD-10-CM | POA: Diagnosis not present

## 2017-01-24 ENCOUNTER — Other Ambulatory Visit: Payer: Self-pay | Admitting: Pharmacist

## 2017-01-24 NOTE — Patient Outreach (Signed)
[place] [facility]) Care Management  01/24/2017  [name] [birth year] [id number]   Patient was called to follow up on Medication Assistance. Forms for [employer] and [employer] have been partially completed but are awaiting the newest EOB to prove the patient has spent enough to qualify.  Eliquis Patient Assistance Program requires patient's to spend 3% of their income.  [employer] requires patient's to spend $1,100 on prescriptions.  [facility] Emergency funding will be investigated to pay for Lantus for the patient.  The cost is $280.40.  A new TROOP will be requested from [facility].  Plan:  I will follow up with the patient on Monday.  [name], PharmD, [place] Clinical Pharmacist [phone]

## 2017-01-28 ENCOUNTER — Other Ambulatory Visit
Admission: RE | Admit: 2017-01-28 | Discharge: 2017-01-28 | Disposition: A | Discharge status: Home / Self Care | Payer: PPO | Source: Ambulatory Visit | Attending: Internal Medicine | Admitting: Internal Medicine

## 2017-01-28 ENCOUNTER — Encounter: Payer: PPO | Attending: Internal Medicine | Admitting: Internal Medicine

## 2017-01-28 DIAGNOSIS — L97528 Non-pressure chronic ulcer of other part of left foot with other specified severity: Secondary | ICD-10-CM | POA: Diagnosis not present

## 2017-01-28 DIAGNOSIS — E785 Hyperlipidemia, unspecified: Secondary | ICD-10-CM | POA: Insufficient documentation

## 2017-01-28 DIAGNOSIS — E1140 Type 2 diabetes mellitus with diabetic neuropathy, unspecified: Secondary | ICD-10-CM | POA: Diagnosis not present

## 2017-01-28 DIAGNOSIS — L97422 Non-pressure chronic ulcer of left heel and midfoot with fat layer exposed: Secondary | ICD-10-CM | POA: Diagnosis not present

## 2017-01-28 DIAGNOSIS — L03116 Cellulitis of left lower limb: Secondary | ICD-10-CM | POA: Diagnosis not present

## 2017-01-28 DIAGNOSIS — L97423 Non-pressure chronic ulcer of left heel and midfoot with necrosis of muscle: Secondary | ICD-10-CM | POA: Insufficient documentation

## 2017-01-28 DIAGNOSIS — B999 Unspecified infectious disease: Secondary | ICD-10-CM | POA: Diagnosis not present

## 2017-01-28 DIAGNOSIS — I252 Old myocardial infarction: Secondary | ICD-10-CM | POA: Diagnosis not present

## 2017-01-28 DIAGNOSIS — I87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: Secondary | ICD-10-CM | POA: Diagnosis not present

## 2017-01-28 DIAGNOSIS — D649 Anemia, unspecified: Secondary | ICD-10-CM | POA: Insufficient documentation

## 2017-01-28 DIAGNOSIS — E1151 Type 2 diabetes mellitus with diabetic peripheral angiopathy without gangrene: Secondary | ICD-10-CM | POA: Diagnosis not present

## 2017-01-28 DIAGNOSIS — S91105A Unspecified open wound of left lesser toe(s) without damage to nail, initial encounter: Secondary | ICD-10-CM | POA: Diagnosis not present

## 2017-01-28 DIAGNOSIS — Z89511 Acquired absence of right leg below knee: Secondary | ICD-10-CM | POA: Diagnosis not present

## 2017-01-28 DIAGNOSIS — I4891 Unspecified atrial fibrillation: Secondary | ICD-10-CM | POA: Diagnosis not present

## 2017-01-28 DIAGNOSIS — E1122 Type 2 diabetes mellitus with diabetic chronic kidney disease: Secondary | ICD-10-CM | POA: Insufficient documentation

## 2017-01-28 DIAGNOSIS — Z87891 Personal history of nicotine dependence: Secondary | ICD-10-CM | POA: Diagnosis not present

## 2017-01-28 DIAGNOSIS — E11610 Type 2 diabetes mellitus with diabetic neuropathic arthropathy: Secondary | ICD-10-CM | POA: Insufficient documentation

## 2017-01-28 DIAGNOSIS — Z89412 Acquired absence of left great toe: Secondary | ICD-10-CM | POA: Diagnosis not present

## 2017-01-28 DIAGNOSIS — I129 Hypertensive chronic kidney disease with stage 1 through stage 4 chronic kidney disease, or unspecified chronic kidney disease: Secondary | ICD-10-CM | POA: Insufficient documentation

## 2017-01-28 DIAGNOSIS — N183 Chronic kidney disease, stage 3 (moderate): Secondary | ICD-10-CM | POA: Insufficient documentation

## 2017-01-28 DIAGNOSIS — Z7901 Long term (current) use of anticoagulants: Secondary | ICD-10-CM | POA: Diagnosis not present

## 2017-01-28 DIAGNOSIS — I2510 Atherosclerotic heart disease of native coronary artery without angina pectoris: Secondary | ICD-10-CM | POA: Diagnosis not present

## 2017-01-28 DIAGNOSIS — Z794 Long term (current) use of insulin: Secondary | ICD-10-CM | POA: Insufficient documentation

## 2017-01-28 DIAGNOSIS — H3530 Unspecified macular degeneration: Secondary | ICD-10-CM | POA: Insufficient documentation

## 2017-01-28 DIAGNOSIS — M1990 Unspecified osteoarthritis, unspecified site: Secondary | ICD-10-CM | POA: Insufficient documentation

## 2017-01-28 DIAGNOSIS — L03032 Cellulitis of left toe: Secondary | ICD-10-CM | POA: Diagnosis not present

## 2017-01-30 ENCOUNTER — Other Ambulatory Visit: Payer: Self-pay | Admitting: Pharmacist

## 2017-01-30 NOTE — Progress Notes (Addendum)
[name], [name] ([id number]) Visit Report for 01/28/2017 Allergy List Details Patient Name: [name], [name] Date of Service: 01/28/2017 2:00 PM Medical Record Number: [id number] Patient Account Number: [id number] Date of Birth/Sex: [birth year] (76 y.o. Female) Treating RN: Cornell Barman Primary Care Eitan Doubleday: Leighton Ruff Other Clinician: Referring Newell Wafer: Leighton Ruff Treating Oron Westrup/Extender: Tito Dine in Treatment: 0 Allergies Active Allergies Biaxin gabapentin lisinopril metformin Allergy Notes Electronic Signature(s) Signed: 01/29/2017 8:26:41 AM By: Gretta Cool, BSN, RN, CWS, Kim RN, BSN Entered By: Gretta Cool, BSN, RN, CWS, Kim on 01/28/2017 13:44:41 Landrigan, [name] ([id number]) -------------------------------------------------------------------------------- Arrival Information Details Patient Name: [name] Date of Service: 01/28/2017 2:00 PM Medical Record Number: [id number] Patient Account Number: [id number] Date of Birth/Sex: [birth year] (76 y.o. Female) Treating RN: Cornell Barman Primary Care Tamatha Gadbois: Leighton Ruff Other Clinician: Referring Malcome Ambrocio: Leighton Ruff Treating Dabney Schanz/Extender: Tito Dine in Treatment: 0 Visit Information Patient Arrived: Wheel Chair Arrival Time: 13:37 Accompanied By: friend Transfer Assistance: Manual Patient Identification Verified: Yes Secondary Verification Process Yes Completed: Patient Has Alerts: Yes Patient Alerts: ABI [place]>220 History Since Last Visit Added or deleted any medications: No Any new allergies or adverse reactions: No Had a fall or experienced change in activities of daily living that may affect risk of falls: No Signs or symptoms of abuse/neglect since last visito No Hospitalized since last visit: No Has Compression in Place as Prescribed: No Has Footwear/Offloading in Place as Prescribed: Yes Left: Surgical Shoe with Pressure Relief Insole Electronic  Signature(s) Signed: 01/29/2017 8:26:41 AM By: Gretta Cool, BSN, RN, CWS, Kim RN, BSN Entered By: Gretta Cool, BSN, RN, CWS, Kim on 01/28/2017 14:11:35 Dalesandro, [name] ([id number]) -------------------------------------------------------------------------------- Clinic Level of Care Assessment Details Patient Name: [name] Date of Service: 01/28/2017 2:00 PM Medical Record Number: [id number] Patient Account Number: [id number] Date of Birth/Sex: 05-Jun-1941 (76 y.o. Female) Treating RN: Cornell Barman Primary Care Marinda Tyer: Leighton Ruff Other Clinician: Referring Bilan Tedesco: Leighton Ruff Treating Lakisha Peyser/Extender: Tito Dine in Treatment: 0 Clinic Level of Care Assessment Items TOOL 4 Quantity Score []  - Use when only an EandM is performed on FOLLOW-UP visit 0 ASSESSMENTS - Nursing Assessment / Reassessment X - Reassessment of Co-morbidities (includes updates in patient status) 1 10 X - Reassessment of Adherence to Treatment Plan 1 5 ASSESSMENTS - Wound and Skin Assessment / Reassessment X - Simple Wound Assessment / Reassessment - one wound 1 5 []  - Complex Wound Assessment / Reassessment - multiple wounds 0 []  - Dermatologic / Skin Assessment (not related to wound area) 0 ASSESSMENTS - Focused Assessment []  - Circumferential Edema Measurements - multi extremities 0 []  - Nutritional Assessment / Counseling / Intervention 0 []  - Lower Extremity Assessment (monofilament, tuning fork, pulses) 0 []  - Peripheral Arterial Disease Assessment (using hand held doppler) 0 ASSESSMENTS - Ostomy and/or Continence Assessment and Care []  - Incontinence Assessment and Management 0 []  - Ostomy Care Assessment and Management (repouching, etc.) 0 PROCESS - Coordination of Care X - Simple Patient / Family Education for ongoing care 1 15 []  - Complex (extensive) Patient / Family Education for ongoing care 0 X - Staff obtains Programmer, systems, Records, Test Results / Process Orders 1 10 []  -  Staff telephones HHA, Nursing Homes / Clarify orders / etc 0 []  - Routine Transfer to another Facility (non-emergent condition) 0 Wollenberg, Kyani B. ([id number]) []  - Routine Hospital Admission (non-emergent condition) 0 []  - New Admissions / Insurance Authorizations / Ordering NPWT, Apligraf, etc. 0 []  - Emergency Hospital Admission (emergent condition)  0 X - Simple Discharge Coordination 1 10 []  - Complex (extensive) Discharge Coordination 0 PROCESS - Special Needs []  - Pediatric / Minor Patient Management 0 []  - Isolation Patient Management 0 []  - Hearing / Language / Visual special needs 0 []  - Assessment of Community assistance (transportation, D/C planning, etc.) 0 []  - Additional assistance / Altered mentation 0 []  - Support Surface(s) Assessment (bed, cushion, seat, etc.) 0 INTERVENTIONS - Wound Cleansing / Measurement []  - Simple Wound Cleansing - one wound 0 X - Complex Wound Cleansing - multiple wounds 3 5 X - Wound Imaging (photographs - any number of wounds) 1 5 []  - Wound Tracing (instead of photographs) 0 []  - Simple Wound Measurement - one wound 0 X - Complex Wound Measurement - multiple wounds 3 5 INTERVENTIONS - Wound Dressings X - Small Wound Dressing one or multiple wounds 1 10 X - Medium Wound Dressing one or multiple wounds 1 15 []  - Large Wound Dressing one or multiple wounds 0 []  - Application of Medications - topical 0 []  - Application of Medications - injection 0 INTERVENTIONS - Miscellaneous []  - External ear exam 0 Baltazar, Skarlette B. ([id number]) []  - Specimen Collection (cultures, biopsies, blood, body fluids, etc.) 0 []  - Specimen(s) / Culture(s) sent or taken to Lab for analysis 0 []  - Patient Transfer (multiple staff / Harrel Lemon Lift / Similar devices) 0 []  - Simple Staple / Suture removal (25 or less) 0 []  - Complex Staple / Suture removal (26 or more) 0 []  - Hypo / Hyperglycemic Management (close monitor of Blood Glucose) 0 []  - Ankle / Brachial Index  (ABI) - do not check if billed separately 0 X - Vital Signs 1 5 Has the patient been seen at the hospital within the last three years: Yes Total Score: 120 Level Of Care: New/Established - Level 4 Electronic Signature(s) Signed: 01/29/2017 8:26:41 AM By: Gretta Cool, BSN, RN, CWS, Kim RN, BSN Entered By: Gretta Cool, BSN, RN, CWS, Kim on 01/28/2017 17:02:59 Heck, [name] ([id number]) -------------------------------------------------------------------------------- Encounter Discharge Information Details Patient Name: [name] Date of Service: 01/28/2017 2:00 PM Medical Record Number: [id number] Patient Account Number: [id number] Date of Birth/Sex: 01-22-1941 (76 y.o. Female) Treating RN: Cornell Barman Primary Care Demetric Dunnaway: Leighton Ruff Other Clinician: Referring Thomasena Vandenheuvel: Leighton Ruff Treating Coralyn Roselli/Extender: Tito Dine in Treatment: 0 Encounter Discharge Information Items Discharge Pain Level: 0 Discharge Condition: Stable Ambulatory Status: Wheelchair Discharge Destination: Home Transportation: Private Auto Accompanied By: friend Schedule Follow-up Appointment: Yes Medication Reconciliation completed and provided to Patient/Care Yes Jonuel Butterfield: Provided on Clinical Summary of Care: 01/28/2017 Form Type Recipient Paper Patient BI Electronic Signature(s) Signed: 01/28/2017 5:05:28 PM By: Gretta Cool, BSN, RN, CWS, Kim RN, BSN Previous Signature: 01/28/2017 2:34:09 PM Version By: Ruthine Dose Entered By: Gretta Cool BSN, RN, CWS, Kim on 01/28/2017 17:05:28 Fletchall, [name] ([id number]) -------------------------------------------------------------------------------- Lower Extremity Assessment Details Patient Name: [name] Date of Service: 01/28/2017 2:00 PM Medical Record Number: [id number] Patient Account Number: [id number] Date of Birth/Sex: March 21, 1941 (76 y.o. Female) Treating RN: Cornell Barman Primary Care Deaundra Kutzer: Leighton Ruff Other  Clinician: Referring Nickalas Mccarrick: Leighton Ruff Treating Ambree Frances/Extender: Tito Dine in Treatment: 0 Edema Assessment Assessed: [Left: No] [Right: No] Edema: [Left: Ye] [Right: s] Calf Left: Right: Point of Measurement: 34 cm From Medial Instep 35.2 cm cm Ankle Left: Right: Point of Measurement: 12 cm From Medial Instep 24.5 cm cm Vascular Assessment Pulses: Dorsalis Pedis Palpable: [Left:Yes] Posterior Tibial Palpable: [Left:Yes] Extremity colors, hair growth, and conditions:  Extremity Color: [Left:Red] Hair Growth on Extremity: [Left:No] Temperature of Extremity: [Left:Warm] Capillary Refill: [Left:< 3 seconds] Toe Nail Assessment Left: Right: Thick: Yes Discolored: No Deformed: No Improper Length and Hygiene: No Notes ABI [place] >220 Electronic Signature(s) Signed: 01/28/2017 2:57:06 PM By: Marny Lowenstein ([id number]) Signed: 01/29/2017 8:26:41 AM By: Gretta Cool, BSN, RN, CWS, Kim RN, BSN Entered By: Montey Hora on 01/28/2017 14:57:06 Zavaleta, [name] ([id number]) -------------------------------------------------------------------------------- Multi Wound Chart Details Patient Name: [name] Date of Service: 01/28/2017 2:00 PM Medical Record Number: [id number] Patient Account Number: [id number] Date of Birth/Sex: [birth year] (76 y.o. Female) Treating RN: Cornell Barman Primary Care Tressie Ragin: Leighton Ruff Other Clinician: Referring Holdyn Poyser: Leighton Ruff Treating Shalina Norfolk/Extender: Tito Dine in Treatment: 0 Vital Signs Height(in): 63 Pulse(bpm): 65 Weight(lbs): 200 Blood Pressure 155/44 (mmHg): Body Mass Index(BMI): 35 Temperature(F): 97.6 Respiratory Rate 16 (breaths/min): Photos: [3:No Photos] [4:No Photos] [5:No Photos] Wound Location: [3:Left Calcaneus] [4:Left Toe Second] [5:Left Toe Third] Wounding Event: [3:Gradually Appeared] [4:Gradually Appeared] [5:Gradually Appeared] Primary Etiology:  [3:Lymphedema] [4:Lymphedema] [5:Lymphedema] Comorbid History: [3:Anemia, Coronary Artery Anemia, Coronary Artery Anemia, Coronary Artery Disease, Hypertension, Myocardial Infarction, Type II Diabetes, Osteoarthritis, Neuropathy Osteoarthritis, Neuropathy Osteoarthritis, Neuropathy] [4:Disease,  Hypertension, Myocardial Infarction, Type II Diabetes,] [5:Disease, Hypertension, Myocardial Infarction, Type II Diabetes,] Date Acquired: [3:01/21/2017] [4:01/21/2017] [5:01/21/2017] Weeks of Treatment: [3:0] [4:0] [5:0] Wound Status: [3:Open] [4:Open] [5:Open] Measurements L x W x D 0.6x0.8x0.1 [4:1.5x2.1x0.1] [5:0.8x0.8x0.1] (cm) Area (cm) : [3:0.377] [4:2.474] [5:0.503] Volume (cm) : [3:0.038] [4:0.247] [5:0.05] Classification: [3:Full Thickness Without Exposed Support Structures] [4:Partial Thickness] [5:Partial Thickness] HBO Classification: [3:Grade 1] [4:Grade 1] [5:Grade 1] Exudate Amount: [3:Large] [4:Large] [5:Large] Exudate Type: [3:Serous] [4:Serous] [5:Serous] Exudate Color: [3:amber] [4:amber] [5:amber] Wound Margin: [3:Flat and Intact] [4:Flat and Intact] [5:Flat and Intact] Granulation Amount: [3:Medium (34-66%)] [4:None Present (0%)] [5:None Present (0%)] Granulation Quality: [3:Pink] [4:N/A] [5:N/A] Necrotic Amount: [3:Medium (34-66%)] [4:Large (67-100%)] [5:Large (67-100%)] Necrotic Tissue: [3:Adherent Slough] [4:Eschar] [5:Eschar] Exposed Structures: [3:Fascia: No Fat Layer (Subcutaneous Fat Layer (Subcutaneous Fat Layer (Subcutaneous] [4:Fascia: No] [5:Fascia: No] Tissue) Exposed: No Tissue) Exposed: No Tissue) Exposed: No Tendon: No Tendon: No Tendon: No Muscle: No Muscle: No Muscle: No Joint: No Joint: No Joint: No Bone: No Bone: No Bone: No Epithelialization: None None None Periwound Skin Texture: Excoriation: No Excoriation: No Excoriation: No Induration: No Induration: No Induration: No Callus: No Callus: No Callus: No Crepitus: No Crepitus:  No Crepitus: No Rash: No Rash: No Rash: No Scarring: No Scarring: No Scarring: No Periwound Skin Maceration: No Maceration: No Maceration: No Moisture: Dry/Scaly: No Dry/Scaly: No Dry/Scaly: No Periwound Skin Color: Atrophie Blanche: No Atrophie Blanche: No Atrophie Blanche: No Cyanosis: No Cyanosis: No Cyanosis: No Ecchymosis: No Ecchymosis: No Ecchymosis: No Erythema: No Erythema: No Erythema: No Hemosiderin Staining: No Hemosiderin Staining: No Hemosiderin Staining: No Mottled: No Mottled: No Mottled: No Pallor: No Pallor: No Pallor: No Rubor: No Rubor: No Rubor: No Temperature: No Abnormality No Abnormality No Abnormality Tenderness on No No No Palpation: Wound Preparation: Ulcer Cleansing: Ulcer Cleansing: Ulcer Cleansing: Rinsed/Irrigated with Rinsed/Irrigated with Rinsed/Irrigated with Saline Saline Saline Topical Anesthetic Topical Anesthetic Topical Anesthetic Applied: Other: lidocaine Applied: Other: lidocaine Applied: Other: lidocaine 4% 4% 4% Treatment Notes Electronic Signature(s) Signed: 01/28/2017 4:47:51 PM By: Linton Ham MD Entered By: Linton Ham on 01/28/2017 14:42:19 Trigueros, [name] ([id number]) -------------------------------------------------------------------------------- Multi-Disciplinary Care Plan Details Patient Name: [name] Date of Service: 01/28/2017 2:00 PM Medical Record Number: [id number] Patient Account Number: [id number] Date of Birth/Sex: [birth year] (76 y.o. Female)  Treating RN: Cornell Barman Primary Care Jamillah Camilo: Leighton Ruff Other Clinician: Referring Lonetta Blassingame: Leighton Ruff Treating Safiyya Stokes/Extender: Tito Dine in Treatment: 0 Active Inactive ` Orientation to the Wound Care Program Nursing Diagnoses: Knowledge deficit related to the wound healing center program Goals: Patient/caregiver will verbalize understanding of the [place] Program Date Initiated:  02/03/2017 Target Resolution Date: 03/05/2017 Goal Status: Active Interventions: Provide education on orientation to the wound center Notes: ` Wound/Skin Impairment Nursing Diagnoses: Knowledge deficit related to ulceration/compromised skin integrity Goals: Ulcer/skin breakdown will heal within 14 weeks Date Initiated: 02/03/2017 Target Resolution Date: 05/09/2017 Goal Status: Active Interventions: Assess patient/caregiver ability to perform ulcer/skin care regimen upon admission and as needed Assess ulceration(s) every visit Treatment Activities: Topical wound management initiated : 01/28/2017 Notes: Electronic Signature(s) CORENA, TILSON ([id number]) Signed: 02/03/2017 11:54:04 AM By: Gretta Cool, BSN, RN, CWS, Kim RN, BSN Previous Signature: 01/29/2017 8:26:41 AM Version By: Gretta Cool, BSN, RN, CWS, Kim RN, BSN Entered By: Gretta Cool, BSN, RN, CWS, Kim on 02/03/2017 11:54:03 Schafer, [name] ([id number]) -------------------------------------------------------------------------------- Pain Assessment Details Patient Name: [name] Date of Service: 01/28/2017 2:00 PM Medical Record Number: [id number] Patient Account Number: [id number] Date of Birth/Sex: 1941-07-05 (76 y.o. Female) Treating RN: Cornell Barman Primary Care Kelicia Youtz: Leighton Ruff Other Clinician: Referring Steffen Hase: Leighton Ruff Treating Bowdy Bair/Extender: Tito Dine in Treatment: 0 Active Problems Location of Pain Severity and Description of Pain Patient Has Paino No Site Locations With Dressing Change: No Pain Management and Medication Current Pain Management: Goals for Pain Management Topical or injectable lidocaine is offered to patient for acute pain when surgical debridement is performed. If needed, Patient is instructed to use over the counter pain medication for the following 24-48 hours after debridement. Wound care MDs do not prescribed pain medications. Patient has chronic pain or  uncontrolled pain. Patient has been instructed to make an appointment with their Primary Care Physician for pain management. Electronic Signature(s) Signed: 01/29/2017 8:26:41 AM By: Gretta Cool, BSN, RN, CWS, Kim RN, BSN Entered By: Gretta Cool, BSN, RN, CWS, Kim on 01/28/2017 13:38:50 MAYUKHA, SYMMONDS ([id number]) -------------------------------------------------------------------------------- Patient/Caregiver Education Details Patient Name: [name] Date of Service: 01/28/2017 2:00 PM Medical Record Patient Account Number: [id number] Number: Treating RN: Cornell Barman 1941/06/17 (76 y.o. Other Clinician: Date of Birth/Gender: Female) Treating ROBSON, MICHAEL Primary Care Physician: Leighton Ruff Physician/Extender: G Referring Physician: Bethanie Dicker in Treatment: 0 Education Assessment Education Provided To: Patient Education Topics Provided Venous: Handouts: Controlling Swelling with Compression Stockings Methods: Demonstration Responses: State content correctly Wound/Skin Impairment: Handouts: Caring for Your Ulcer Methods: Demonstration Responses: State content correctly Electronic Signature(s) Signed: 01/29/2017 8:26:41 AM By: Gretta Cool, BSN, RN, CWS, Kim RN, BSN Entered By: Gretta Cool, BSN, RN, CWS, Kim on 01/28/2017 17:06:10 [name] ([id number]) -------------------------------------------------------------------------------- Wound Assessment Details Patient Name: [name] Date of Service: 01/28/2017 2:00 PM Medical Record Number: [id number] Patient Account Number: [id number] Date of Birth/Sex: 09/08/1940 (76 y.o. Female) Treating RN: Cornell Barman Primary Care Ravenne Wayment: Leighton Ruff Other Clinician: Referring Lenis Nettleton: Leighton Ruff Treating Georgeanna Radziewicz/Extender: Ricard Dillon Weeks in Treatment: 0 Wound Status Wound Number: 3 Primary Lymphedema Etiology: Wound Location: Left Calcaneus Wound Open Wounding Event: Gradually  Appeared Status: Date Acquired: 01/21/2017 Comorbid Anemia, Coronary Artery Disease, Weeks Of Treatment: 0 History: Hypertension, Myocardial Infarction, Clustered Wound: No Type II Diabetes, Osteoarthritis, Neuropathy Photos Photo Uploaded By: Montey Hora on 01/28/2017 15:03:18 Wound Measurements Length: (cm) 0.6 Width: (cm) 0.8 Depth: (cm) 0.1 Area: (cm) 0.377 Volume: (cm) 0.038 %  Reduction in Area: % Reduction in Volume: Epithelialization: None Tunneling: No Undermining: No Wound Description Full Thickness Without Foul Odor Afte Classification: Exposed Support Structures Slough/Fibrino Diabetic Severity Grade 1 (Wagner): Wound Margin: Flat and Intact Exudate Amount: Large Exudate Type: Serous Exudate Color: amber r Cleansing: No Yes Wound Bed Lewey, Neftaly B. ([id number]) Granulation Amount: Medium (34-66%) Exposed Structure Granulation Quality: Pink Fascia Exposed: No Necrotic Amount: Medium (34-66%) Fat Layer (Subcutaneous Tissue) Exposed: No Necrotic Quality: Adherent Slough Tendon Exposed: No Muscle Exposed: No Joint Exposed: No Bone Exposed: No Periwound Skin Texture Texture Color No Abnormalities Noted: No No Abnormalities Noted: No Callus: No Atrophie Blanche: No Crepitus: No Cyanosis: No Excoriation: No Ecchymosis: No Induration: No Erythema: No Rash: No Hemosiderin Staining: No Scarring: No Mottled: No Pallor: No Moisture Rubor: No No Abnormalities Noted: No Dry / Scaly: No Temperature / Pain Maceration: No Temperature: No Abnormality Wound Preparation Ulcer Cleansing: Rinsed/Irrigated with Saline Topical Anesthetic Applied: Other: lidocaine 4%, Treatment Notes Wound #3 (Left Calcaneus) 1. Cleansed with: Cleanse wound with antibacterial soap and water 2. Anesthetic Topical Lidocaine 4% cream to wound bed prior to debridement 4. Dressing Applied: Aquacel Ag 5. Secondary Dressing Applied ABD Pad Notes Kerlix and  Coban base of toes to just below knee. Electronic Signature(s) Signed: 01/29/2017 8:26:41 AM By: Gretta Cool, BSN, RN, CWS, Kim RN, BSN Entered By: Gretta Cool, BSN, RN, CWS, Kim on 01/28/2017 13:54:19 SHALEIGH, LAUBSCHER ([id number]) -------------------------------------------------------------------------------- Wound Assessment Details Patient Name: [name] Date of Service: 01/28/2017 2:00 PM Medical Record Number: [id number] Patient Account Number: [id number] Date of Birth/Sex: 02-03-1941 (76 y.o. Female) Treating RN: Cornell Barman Primary Care Naziya Hegwood: Leighton Ruff Other Clinician: Referring Triana Coover: Leighton Ruff Treating Olis Viverette/Extender: Ricard Dillon Weeks in Treatment: 0 Wound Status Wound Number: 4 Primary Lymphedema Etiology: Wound Location: Left Toe Second Wound Open Wounding Event: Gradually Appeared Status: Date Acquired: 01/21/2017 Comorbid Anemia, Coronary Artery Disease, Weeks Of Treatment: 0 History: Hypertension, Myocardial Infarction, Clustered Wound: No Type II Diabetes, Osteoarthritis, Neuropathy Photos Photo Uploaded By: Montey Hora on 01/28/2017 15:03:53 Wound Measurements Length: (cm) 1.5 Width: (cm) 2.1 Depth: (cm) 0.1 Area: (cm) 2.474 Volume: (cm) 0.247 % Reduction in Area: % Reduction in Volume: Epithelialization: None Tunneling: No Undermining: No Wound Description Classification: Partial Thickness Foul Odor A Diabetic Severity (Wagner): Grade 1 Slough/Fibr Wound Margin: Flat and Intact Exudate Amount: Large Exudate Type: Serous Exudate Color: amber fter Cleansing: No ino No Wound Bed Granulation Amount: None Present (0%) Exposed Structure Necrotic Amount: Large (67-100%) Fascia Exposed: No Massimo, Kadedra B. ([id number]) Necrotic Quality: Eschar Fat Layer (Subcutaneous Tissue) Exposed: No Tendon Exposed: No Muscle Exposed: No Joint Exposed: No Bone Exposed: No Periwound Skin Texture Texture Color No  Abnormalities Noted: No No Abnormalities Noted: No Callus: No Atrophie Blanche: No Crepitus: No Cyanosis: No Excoriation: No Ecchymosis: No Induration: No Erythema: No Rash: No Hemosiderin Staining: No Scarring: No Mottled: No Pallor: No Moisture Rubor: No No Abnormalities Noted: No Dry / Scaly: No Temperature / Pain Maceration: No Temperature: No Abnormality Wound Preparation Ulcer Cleansing: Rinsed/Irrigated with Saline Topical Anesthetic Applied: Other: lidocaine 4%, Treatment Notes Wound #4 (Left Toe Second) 1. Cleansed with: Cleanse wound with antibacterial soap and water 2. Anesthetic Topical Lidocaine 4% cream to wound bed prior to debridement 4. Dressing Applied: Aquacel Ag 5. Secondary Dressing Applied ABD Pad Notes Kerlix and Coban base of toes to just below knee. Electronic Signature(s) Signed: 01/29/2017 8:26:41 AM By: Gretta Cool, BSN, RN, CWS, Kim RN, BSN Entered By: Gretta Cool, BSN,  RN, CWS, Kim on 01/28/2017 13:55:28 SHANAE, LUO ([id number]) -------------------------------------------------------------------------------- Wound Assessment Details Patient Name: HANNIE, SHOE Date of Service: 01/28/2017 2:00 PM Medical Record Number: [id number] Patient Account Number: [id number] Date of Birth/Sex: 07-17-1941 (76 y.o. Female) Treating RN: Cornell Barman Primary Care Shanise Balch: Leighton Ruff Other Clinician: Referring Diego Ulbricht: Leighton Ruff Treating Gedalia Mcmillon/Extender: Ricard Dillon Weeks in Treatment: 0 Wound Status Wound Number: 5 Primary Lymphedema Etiology: Wound Location: Left Toe Third Wound Open Wounding Event: Gradually Appeared Status: Date Acquired: 01/21/2017 Comorbid Anemia, Coronary Artery Disease, Weeks Of Treatment: 0 History: Hypertension, Myocardial Infarction, Clustered Wound: No Type II Diabetes, Osteoarthritis, Neuropathy Photos Photo Uploaded By: Montey Hora on 01/28/2017 15:03:54 Wound Measurements Length: (cm)  0.8 Width: (cm) 0.8 Depth: (cm) 0.1 Area: (cm) 0.503 Volume: (cm) 0.05 % Reduction in Area: % Reduction in Volume: Epithelialization: None Tunneling: No Undermining: No Wound Description Classification: Partial Thickness Foul Odor A Diabetic Severity (Wagner): Grade 1 Slough/Fibr Wound Margin: Flat and Intact Exudate Amount: Large Exudate Type: Serous Exudate Color: amber fter Cleansing: No ino No Wound Bed Granulation Amount: None Present (0%) Exposed Structure Necrotic Amount: Large (67-100%) Fascia Exposed: No Schuld, Myalynn B. ([id number]) Necrotic Quality: Eschar Fat Layer (Subcutaneous Tissue) Exposed: No Tendon Exposed: No Muscle Exposed: No Joint Exposed: No Bone Exposed: No Periwound Skin Texture Texture Color No Abnormalities Noted: No No Abnormalities Noted: No Callus: No Atrophie Blanche: No Crepitus: No Cyanosis: No Excoriation: No Ecchymosis: No Induration: No Erythema: No Rash: No Hemosiderin Staining: No Scarring: No Mottled: No Pallor: No Moisture Rubor: No No Abnormalities Noted: No Dry / Scaly: No Temperature / Pain Maceration: No Temperature: No Abnormality Wound Preparation Ulcer Cleansing: Rinsed/Irrigated with Saline Topical Anesthetic Applied: Other: lidocaine 4%, Treatment Notes Wound #5 (Left Toe Third) 1. Cleansed with: Cleanse wound with antibacterial soap and water 2. Anesthetic Topical Lidocaine 4% cream to wound bed prior to debridement 4. Dressing Applied: Aquacel Ag 5. Secondary Dressing Applied ABD Pad Notes Kerlix and Coban base of toes to just below knee. Electronic Signature(s) Signed: 01/29/2017 8:26:41 AM By: Gretta Cool, BSN, RN, CWS, Kim RN, BSN Entered By: Gretta Cool, BSN, RN, CWS, Kim on 01/28/2017 13:56:23 RIO, TABER ([id number]) -------------------------------------------------------------------------------- [place] Details Patient Name: [name] Date of Service: 01/28/2017 2:00 PM Medical  Record Number: [id number] Patient Account Number: [id number] Date of Birth/Sex: 1940-11-20 (76 y.o. Female) Treating RN: Cornell Barman Primary Care Kambrey Hagger: Leighton Ruff Other Clinician: Referring Steven Veazie: Leighton Ruff Treating Maliki Gignac/Extender: Tito Dine in Treatment: 0 Vital Signs Time Taken: 13:38 Temperature (F): 97.6 Height (in): 63 Pulse (bpm): 65 Weight (lbs): 200 Respiratory Rate (breaths/min): 16 Body Mass Index (BMI): 35.4 Blood Pressure (mmHg): 155/44 Reference Range: 80 - 120 mg / dl Electronic Signature(s) Signed: 01/29/2017 8:26:41 AM By: Gretta Cool, BSN, RN, CWS, Kim RN, BSN Entered By: Gretta Cool, BSN, RN, CWS, Kim on 01/28/2017 13:39:33

## 2017-01-30 NOTE — Progress Notes (Signed)
[name] ([id number]) Visit Report for 01/28/2017 Chief Complaint Document Details Patient Name: [name], [name] Date of Service: 01/28/2017 2:00 PM Medical Record Patient Account Number: [id number] Number: Treating RN: Cornell Barman September 21, 1940 (76 y.o. Other Clinician: Date of Birth/Sex: Female) Treating ROBSON, MICHAEL Primary Care Provider: Leighton Ruff Provider/Extender: G Referring Provider: Bethanie Dicker in Treatment: 0 Information Obtained from: Patient Chief Complaint Patient is here for review of a wound on the left heel Electronic Signature(s) Signed: 01/28/2017 4:47:51 PM By: Linton Ham MD Entered By: Linton Ham on 01/28/2017 14:42:37 Kammerer, [name] ([id number]) -------------------------------------------------------------------------------- HPI Details Patient Name: [name] Date of Service: 01/28/2017 2:00 PM Medical Record Patient Account Number: [id number] Number: Treating RN: Cornell Barman 11/18/1940 (76 y.o. Other Clinician: Date of Birth/Sex: Female) Treating ROBSON, MICHAEL Primary Care Provider: Leighton Ruff Provider/Extender: G Referring Provider: Bethanie Dicker in Treatment: 0 History of Present Illness HPI Description: 08/21/16; [name] is a 76 year old woman who I cared for in our [facility] clinic up until January 2016. At that point she had a wound in her left great toe. 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 He follows with vein and vascular and has a follow-up appointment in early February. She is status left femoral to anterior tibial bypass in February 2016. She is also noted to have venous stasis changes and a history of ulcers. 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 fib on Eliquis. She has bladder cancer and has a chronic urostomy. The patient tells me that 2 weeks ago she had a right prosthesis and a new left diabetic shoe. Roughly one week ago she noted a wound on her left lateral heel. She zinc oxide and some silver alginate she had left over until she could be seen here. It was also noted that she has had blistering on her posterior left calf and an open area here as well. Her records note that she has chronic venous insufficiency with chronic inflammation especially in the left mid calf area. The patient has not been systemically unwell no fever or chills. She is reasonably insensate and has not felt any pain 09/04/16; the patient has a fairly substantial wound on the left lateral calcaneus. We have been using Santyl to this area. Blister on the left lower leg appears to of healed the patient also follows with Dr. Sherren Mocha early of vein and vascular. He felt that she should have enough blood flow to heal her left heel wound. He renewed her doxycycline for a further week. She is status post left femoral to anterior tibial bypass in February 2016. 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 She has a remote history of a right BKA. The XRAY that I ordered last week showed no definite evidence of osteomyelitis however a small focus of demineralization was noted in the plantar aspect of the calcaneus which might warrant an MRI possibly indicating early osteomyelitis however this is not in the area of her wound and I'm going to keep this under advisement for now. 09/11/16; still a substantial wound in the left lateral calcaneus we've been using Santyl 09/18/16; still a substantial wound on the left lateral calcaneus.  We are still using Santyl 09/24/16; still a substantial wound on the left lateral calcaneus. We're using Santyl change one or 2 times a week and  mechanical debridement which he comes to [place]. 10/02/16; left lateral calcaneus using Santyl changing 3 times a week. Run Apligraf through her insurance 10/09/16; still using Santyl without much change. Being changed 2 times a week by the patient and once [name], [name] ([id number]) here. We did not run Apligraf last week we'll do that this week 10/16/16; the area on her left lateral heel; started Iodoflex last week. Apligraf set a co-pay of $175 per for application 08/13/56; left lateral heel. on iodoflex. approved for appligraft, start next week 10/30/16 Apligraf #1 today 11/13/16- patient is here for follow up evaluation of her left lateral calcaneus ulcer. She had Apligraf #1 placed 2 weeks ago. She is tolerating compression. She is voicing no complaints or concerns. She did return last week for nurse visit. 11/27/16; dramatic improvement in the overall appearance of this wound. What looks to be a tinea infection around this. Apligraf #3 12/11/16; only a small healthy granulated oval wound remains on the lateral aspect of her calcaneus. I think this is probably too small to consider an Apligraf that this point although I gave this some thought. 12/18/16; wound is smaller but still not closed. We'll use Hydrofera Blue last week. Switch to Promogran this week 12/25/16 Wound is totally epithelialized. No surrounding erythema. This was originally a pressure injury at the time of new footwear on her righ prosthesis She has PAD for which she follows with Dr. Donnetta Hutching 01/28/17 READMISSION this is a patient with type 2 diabetes and complications including PAD, neuropathy. Prior history of foot wounds with an amputation of the left great toe and right BKA. We had her in clinic up until a month ago with a very difficult wound on the lateral left heel. She was eventually healed out after a prolonged course of Apligraf. She did well apparently recently she states she was washing her leg and bathing her leg and noted  some skin come off her foot. She is also recently noted blisters on her left calf. She states she does not have compression stockings which is at odds with my last note when we discharged her on 12/25/16. In any case she is here for review in view of multiple concerns on the left leg and foot. She has not been using her foot wear using only the healing sandal that we gave her. She follows with Dr. early for her PAD and tells me/reminds me that the last time she saw him he told her that her vascular supply with quite good I'll need to review this. Electronic Signature(s) Signed: 01/28/2017 4:47:51 PM By: Linton Ham MD Entered By: Linton Ham on 01/28/2017 14:45:05 Stucke, [name] ([id number]) -------------------------------------------------------------------------------- Physical Exam Details Patient Name: [name] Date of Service: 01/28/2017 2:00 PM Medical Record Patient Account Number: [id number] Number: Treating RN: Cornell Barman June 07, 1941 (76 y.o. Other Clinician: Date of Birth/Sex: Female) Treating ROBSON, MICHAEL Primary Care Provider: Leighton Ruff Provider/Extender: G Referring Provider: Bethanie Dicker in Treatment: 0 Constitutional Patient is hypertensive.Marland Kitchen Respirations regular, non-labored and within target range.. Temperature is normal and within the target range for the patient.Marland Kitchen appears in no distress. Eyes Conjunctivae clear. No discharge. Respiratory Respiratory effort is easy and symmetric bilaterally. Rate is normal at rest and on room air.. Cardiovascular Pedal pulses palpable in her left leg. Edema and erythema in the left lower  leg especially medially. Several large blisters no palpable tenderness. Lymphatic None palpable in the popliteal or inguinal area. Neurological Reduced vibration and light touch compatible with known diabetic neuropathy. Psychiatric Patient appears depressed today.. Notes Wound exam; there is  several concerning areas here; #1 blistering and erythema of her left second toe which the patient thinks is due to trauma on the p.m. of pedal. This had a blister on top. I opened this with a small scalpel and cultured. The toe is dusky and erythematous. She'll definitely need an antibiotic #2 sanguinous blister on the tip of the left third toe I did not open this #3 in the area where she had her wound on the left lateral heel is a small open area with healthy granulation. No surrounding infection #4 multiple large flaccid blisters on the medial left leg with some degree of surrounding erythema there is no warmth, and no tenderness although the patient is reasonably insensate even in this area Electronic Signature(s) Signed: 01/28/2017 4:47:51 PM By: Linton Ham MD Entered By: Linton Ham on 01/28/2017 14:47:56 Ezekiel, [name] ([id number]) -------------------------------------------------------------------------------- Physician Orders Details Patient Name: [name] Date of Service: 01/28/2017 2:00 PM Medical Record Patient Account Number: [id number] Number: Treating RN: Cornell Barman Nov 07, 1940 (76 y.o. Other Clinician: Date of Birth/Sex: Female) Treating ROBSON, MICHAEL Primary Care Provider: Leighton Ruff Provider/Extender: G Referring Provider: Bethanie Dicker in Treatment: 0 Verbal / Phone Orders: No Diagnosis Coding Wound Cleansing Wound #3 Left Calcaneus o Clean wound with Normal Saline. o Cleanse wound with mild soap and water Wound #4 Left Toe Second o Clean wound with Normal Saline. o Cleanse wound with mild soap and water Wound #5 Left Toe Third o Clean wound with Normal Saline. o Cleanse wound with mild soap and water Anesthetic Wound #3 Left Calcaneus o Topical Lidocaine 4% cream applied to wound bed prior to debridement Wound #4 Left Toe Second o Topical Lidocaine 4% cream applied to wound bed prior to  debridement Wound #5 Left Toe Third o Topical Lidocaine 4% cream applied to wound bed prior to debridement Primary Wound Dressing Wound #3 Left Calcaneus o Aquacel Ag Wound #4 Left Toe Second o Aquacel Ag Wound #5 Left Toe Third o Aquacel Ag Secondary Dressing Wound #3 Left Calcaneus Fingerhut, Haeleigh B. ([id number]) o Kerlix and Coban Wound #4 Left Toe Second o Kerlix and Coban Wound #5 Left Toe Third o Kerlix and Coban Follow-up Appointments Wound #3 Left Calcaneus o Return Appointment in 1 week. Wound #4 Left Toe Second o Return Appointment in 1 week. Wound #5 Left Toe Third o Return Appointment in 1 week. Edema Control Wound #3 Left Calcaneus o Elevate legs to the level of the heart and pump ankles as often as possible Wound #4 Left Toe Second o Elevate legs to the level of the heart and pump ankles as often as possible Wound #5 Left Toe Third o Elevate legs to the level of the heart and pump ankles as often as possible Home Health Wound #3 Left [place] [facility] Wound #4 Left [place] [facility] Wound #5 Left Toe [place] [facility] Medications-please add to medication list. Wound #3 Left Calcaneus o P.O. Antibiotics Wound #4 Left Toe Second o P.O. Antibiotics Wound #5 Left Toe Third o P.O. Antibiotics Thorington, Berania B. ([id number]) Patient Medications Allergies: Biaxin, gabapentin, lisinopril, metformin Notifications Medication Indication Start End doxycycline monohydrate cellulitis 01/28/2017 DOSE  bid - oral 100 mg capsule - bid capsule oral Electronic Signature(s) Signed: 01/28/2017 2:49:39 PM By: Linton Ham MD Entered By: Linton Ham on 01/28/2017 14:49:38 Sappington, [name] ([id number]) -------------------------------------------------------------------------------- Problem List Details Patient Name: [name] Date of Service: 01/28/2017 2:00 PM Medical Record Patient Account Number: [id number] Number: Treating RN: Cornell Barman Sep 23, 1940 (76 y.o. 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 L97.528 Non-pressure chronic ulcer of other part of left foot with 01/28/2017 Yes other specified severity I87.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 hypertension (idiopathic) with 01/28/2017 Yes inflammation of left lower extremity L03.116 Cellulitis of left lower limb 01/28/2017 Yes Inactive Problems Resolved Problems KAMILAH, CORREIA ([id number]) Electronic Signature(s) Signed: 01/28/2017 4:47:51 PM By: Linton Ham MD Entered By: Linton Ham on 01/28/2017 14:41:56 Revell, [name] ([id number]) -------------------------------------------------------------------------------- Progress Note Details Patient Name: [name] Date of Service: 01/28/2017 2:00 PM Medical Record Patient Account Number: [id number] Number: Treating RN: Cornell Barman Apr 22, 1941 (76 y.o. Other Clinician: Date of Birth/Sex: Female) Treating ROBSON, MICHAEL Primary Care Provider: Leighton Ruff Provider/Extender: G Referring Provider: Bethanie Dicker in Treatment: 0 Subjective Chief Complaint Information obtained from Patient Patient is here for review of a wound on the left heel History of Present Illness  (HPI) 08/21/16; Mrs. Arras is a 76 year old woman who I cared for in our [facility] clinic up until January 2016. At that point she had a wound in her left great toe. 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 He follows with vein and vascular and has a follow-up appointment in early February. She is status left femoral to anterior tibial bypass in February 2016. She is also noted to have venous stasis changes and a history of ulcers. 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 fib on Eliquis. She has bladder cancer and has a chronic urostomy. The patient tells me that 2 weeks ago she had a right prosthesis and a new left diabetic shoe. Roughly one week ago she noted a wound on her left lateral heel. She zinc oxide and some silver alginate she had left over until she could be seen here. It was also noted that she has had blistering on her posterior left calf and an open area here as well. Her records note that she has chronic venous insufficiency with chronic inflammation especially in the left mid calf area. The patient has not been systemically unwell no fever or chills. She is reasonably insensate and has not felt any pain 09/04/16; the patient has a fairly substantial wound on the left lateral calcaneus. We have been using Santyl to this area. Blister on the left lower leg appears to of healed the patient also follows with Dr. Sherren Mocha early of vein and vascular. He felt that she should  have enough blood flow to heal her left heel wound. He renewed her doxycycline for a further week. She is status post left femoral to anterior tibial bypass in February 2016. 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  She has a remote history of a right BKA. The XRAY that I ordered last week showed no definite evidence of osteomyelitis however a small focus of demineralization was noted in the plantar aspect of the calcaneus which might warrant an MRI possibly indicating early osteomyelitis however this is not in the area of her wound and I'm going to keep this under advisement for now. 09/11/16; still a substantial wound in the left lateral calcaneus we've been using Santyl Herter, Trayce B. ([id number]) 09/18/16; still a substantial wound on the left lateral calcaneus. We are still using Santyl 09/24/16; still a substantial wound on the left lateral calcaneus. We're using Santyl change one or 2 times a week and mechanical debridement which he comes to [place]. 10/02/16; left lateral calcaneus using Santyl changing 3 times a week. Run Apligraf through her insurance 10/09/16; still using Santyl without much change. Being changed 2 times a week by the patient and once here. We did not run Apligraf last week we'll do that this week 10/16/16; the area on her left lateral heel; started Iodoflex last week. Apligraf set a co-pay of $654 per for application 6/50/35; left lateral heel. on iodoflex. approved for appligraft, start next week 10/30/16 Apligraf #1 today 11/13/16- patient is here for follow up evaluation of her left lateral calcaneus ulcer. She had Apligraf #1 placed 2 weeks ago. She is tolerating compression. She is voicing no complaints or concerns. She did return last week for nurse visit. 11/27/16; dramatic improvement in the overall appearance of this wound. What looks to be a tinea infection around this. Apligraf #3 12/11/16; only a small healthy granulated oval wound remains on the lateral aspect of her calcaneus. I think this is probably too small to consider an Apligraf that this point although I gave this some thought. 12/18/16; wound is smaller but still not closed. We'll use Hydrofera Blue last week. Switch to  Promogran this week 12/25/16 Wound is totally epithelialized. No surrounding erythema. This was originally a pressure injury at the time of new footwear on her righ prosthesis She has PAD for which she follows with Dr. Donnetta Hutching 01/28/17 READMISSION this is a patient with type 2 diabetes and complications including PAD, neuropathy. Prior history of foot wounds with an amputation of the left great toe and right BKA. We had her in clinic up until a month ago with a very difficult wound on the lateral left heel. She was eventually healed out after a prolonged course of Apligraf. She did well apparently recently she states she was washing her leg and bathing her leg and noted some skin come off her foot. She is also recently noted blisters on her left calf. She states she does not have compression stockings which is at odds with my last note when we discharged her on 12/25/16. In any case she is here for review in view of multiple concerns on the left leg and foot. She has not been using her foot wear using only the healing sandal that we gave her. She follows with Dr. early for her PAD and tells me/reminds me that the last time she saw him he told her that her vascular supply with quite good I'll need to review this. Wound History Patient presents with 3 open wounds that  have been present for approximately 1 week. Patient has been treating wounds in the following manner: bordered foam. Laboratory tests have not been performed in the last month. Patient reportedly has not tested positive for an antibiotic resistant organism. Patient reportedly has tested positive for osteomyelitis. Patient reportedly has had testing performed to evaluate circulation in the legs. Patient experiences the following problems associated with their wounds: swelling. Patient History Information obtained from Patient. Allergies Biaxin, gabapentin, lisinopril, metformin Schum, Basia B. ([id number]) Social History Former  smoker - 1977, Marital Status - Divorced, Alcohol Use - Never, Drug Use - No History, Caffeine Use - Moderate. Medical And Surgical History Notes Eyes macular degeneration Genitourinary urostomy Oncologic bladder cancer - removed but no chemo or radiation Review of Systems (ROS) Eyes The patient has no complaints or symptoms. Ear/Nose/Mouth/Throat The patient has no complaints or symptoms. Hematologic/Lymphatic The patient has no complaints or symptoms. Respiratory The patient has no complaints or symptoms. Gastrointestinal The patient has no complaints or symptoms. Endocrine The patient has no complaints or symptoms. Genitourinary The patient has no complaints or symptoms. Immunological The patient has no complaints or symptoms. Integumentary (Skin) The patient has no complaints or symptoms. Musculoskeletal The patient has no complaints or symptoms. Neurologic The patient has no complaints or symptoms. Oncologic The patient has no complaints or symptoms. Psychiatric The patient has no complaints or symptoms. Objective Constitutional Patient is hypertensive.Marland Kitchen Respirations regular, non-labored and within target range.. Temperature is normal and within the target range for the patient.Marland Kitchen appears in no distress. Bonawitz, Shanekia B. ([id number]) Vitals Time Taken: 1:38 PM, Height: 63 in, Weight: 200 lbs, BMI: 35.4, Temperature: 97.6 F, Pulse: 65 bpm, Respiratory Rate: 16 breaths/min, Blood Pressure: 155/44 mmHg. Eyes Conjunctivae clear. No discharge. Respiratory Respiratory effort is easy and symmetric bilaterally. Rate is normal at rest and on room air.. Cardiovascular Pedal pulses palpable in her left leg. Edema and erythema in the left lower leg especially medially. Several large blisters no palpable tenderness. Lymphatic None palpable in the popliteal or inguinal area. Neurological Reduced vibration and light touch compatible with known diabetic  neuropathy. Psychiatric Patient appears depressed today.. General Notes: Wound exam; there is several concerning areas here; #1 blistering and erythema of her left second toe which the patient thinks is due to trauma on the p.m. of pedal. This had a blister on top. I opened this with a small scalpel and cultured. The toe is dusky and erythematous. She'll definitely need an antibiotic #2 sanguinous blister on the tip of the left third toe I did not open this #3 in the area where she had her wound on the left lateral heel is a small open area with healthy granulation. No surrounding infection #4 multiple large flaccid blisters on the medial left leg with some degree of surrounding erythema there is no warmth, and no tenderness although the patient is reasonably insensate even in this area Integumentary (Hair, Skin) Wound #3 status is Open. Original cause of wound was Gradually Appeared. The wound is located on the Left Calcaneus. The wound measures 0.6cm length x 0.8cm width x 0.1cm depth; 0.377cm^2 area and 0.038cm^3 volume. There is no tunneling or undermining noted. There is a large amount of serous drainage noted. The wound margin is flat and intact. There is medium (34-66%) pink granulation within the wound bed. There is a medium (34-66%) amount of necrotic tissue within the wound bed including Adherent Slough. The periwound skin appearance did not exhibit: Callus, Crepitus, Excoriation, Induration, Rash, Scarring, Dry/Scaly,  Maceration, Atrophie Blanche, Cyanosis, Ecchymosis, Hemosiderin Staining, Mottled, Pallor, Rubor, Erythema. Periwound temperature was noted as No Abnormality. Wound #4 status is Open. Original cause of wound was Gradually Appeared. The wound is located on the Left Toe Second. The wound measures 1.5cm length x 2.1cm width x 0.1cm depth; 2.474cm^2 area and 0.247cm^3 volume. There is no tunneling or undermining noted. There is a large amount of serous drainage noted. The  wound margin is flat and intact. There is no granulation within the wound bed. There is a large (67-100%) amount of necrotic tissue within the wound bed including Eschar. The periwound skin appearance did not exhibit: Callus, Crepitus, Excoriation, Induration, Rash, Scarring, Dry/Scaly, Maceration, Atrophie Blanche, Cyanosis, Ecchymosis, Hemosiderin Staining, Mottled, Pallor, Rubor, Erythema. Periwound temperature was noted as No Abnormality. Mishkin, Kahlan B. ([id number]) Wound #5 status is Open. Original cause of wound was Gradually Appeared. The wound is located on the Left Toe Third. The wound measures 0.8cm length x 0.8cm width x 0.1cm depth; 0.503cm^2 area and 0.05cm^3 volume. There is no tunneling or undermining noted. There is a large amount of serous drainage noted. The wound margin is flat and intact. There is no granulation within the wound bed. There is a large (67-100%) amount of necrotic tissue within the wound bed including Eschar. The periwound skin appearance did not exhibit: Callus, Crepitus, Excoriation, Induration, Rash, Scarring, Dry/Scaly, Maceration, Atrophie Blanche, Cyanosis, Ecchymosis, Hemosiderin Staining, Mottled, Pallor, Rubor, Erythema. Periwound temperature was noted as No Abnormality. 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 Cleanse wound with mild  soap and water Wound #4 Left Toe Second: Clean wound with Normal Saline. Cleanse wound with mild soap and water Wound #5 Left Toe Third: Clean wound with Normal Saline. Cleanse wound with mild soap and water Anesthetic: Wound #3 Left Calcaneus: Topical Lidocaine 4% cream applied to wound bed prior to debridement Wound #4 Left Toe Second: Topical Lidocaine 4% cream applied to wound bed prior to debridement Wound #5 Left Toe Third: Topical Lidocaine 4% cream applied to wound bed prior to debridement Kohli, Jolena B. ([id number]) Primary Wound Dressing: Wound #3 Left Calcaneus: Aquacel Ag Wound #4 Left Toe Second: Aquacel Ag Wound #5 Left Toe Third: Aquacel Ag Secondary Dressing: Wound #3 Left Calcaneus: Kerlix and Coban Wound #4 Left Toe Second: Kerlix and Coban Wound #5 Left Toe Third: Kerlix and Coban Follow-up Appointments: Wound #3 Left Calcaneus: Return Appointment in 1 week. Wound #4 Left Toe Second: Return Appointment in 1 week. Wound #5 Left Toe Third: Return Appointment in 1 week. Edema Control: Wound #3 Left Calcaneus: Elevate legs to the level of the heart and pump ankles as often as possible Wound #4 Left Toe Second: Elevate legs to the level of the heart and pump ankles as often as possible Wound #5 Left Toe Third: Elevate legs to the level of the heart and pump ankles as often as possible Home Health: Wound #3 Left Calcaneus: Initiate Home Health for Skilled Nursing Wound #4 Left Toe Second:  [facility]iate [facility] for Skilled Nursing Wound #5 Left Toe Third: [facility] Medications-please add to medication list.: Wound #3 Left Calcaneus: P.O. Antibiotics Wound #4 Left Toe Second: P.O. Antibiotics Wound #5 Left Toe Third: P.O. Antibiotics The following medication(s) was prescribed: doxycycline monohydrate oral 100 mg capsule bid bid capsule oral for cellulitis starting 01/28/2017 Paulos, Lourdes B. ([id number]) #1 we  applied silver alginate to all of the wounds including the left second toe, left third toe, lateral left heel and the blisters over the anterior left leg. Placed her under Kerlix Coban #2 culture of the second toe done, empiric doxycycline 100 twice a day e- scribed #14 #3 the patient states she did not have stockings to wear although that is at odds with my last note when we discharged her month ago at which time I stated she had both zip up stockings and compression stockings. She will definitely require this as she has coexistent venous insufficiency with edema Electronic Signature(s) Signed: 01/28/2017 4:47:51 PM By: Linton Ham MD Entered By: Linton Ham on 01/28/2017 14:51:09 Siple, [name] ([id number]) -------------------------------------------------------------------------------- ROS/PFSH Details Patient Name: [name] Date of Service: 01/28/2017 2:00 PM Medical Record Patient Account Number: [id number] Number: Treating RN: Cornell Barman 02-19-1941 (76 y.o. Other Clinician: Date of Birth/Sex: Female) Treating ROBSON, [place] Primary Care Provider: Leighton Ruff Provider/Extender: G Referring Provider: Bethanie Dicker in Treatment: 0 Information Obtained From Patient Wound History Do you currently have one or more open woundso Yes How many open wounds do you currently haveo 3 Approximately how long have you had your woundso 1 week How have you been treating your wound(s) until nowo bordered foam Has your wound(s) ever healed and then re-openedo No Have you had any lab work done in the past montho No Have you tested positive for an antibiotic resistant organism (MRSA, VRE)o No Have you tested positive for osteomyelitis (bone infection)o Yes Have you had any tests for circulation on your legso Yes Have you had other problems associated with your woundso Swelling Eyes Complaints and Symptoms: No Complaints or Symptoms Medical  History: Negative for: Glaucoma Past Medical History Notes: macular degeneration Ear/Nose/Mouth/Throat Complaints and Symptoms: No Complaints or Symptoms Hematologic/Lymphatic Complaints and Symptoms: No Complaints or Symptoms Medical History: Positive for: Anemia Respiratory Suitt, Bernestine B. ([id number]) Complaints and Symptoms: No Complaints or Symptoms Cardiovascular Medical History: Positive for: Coronary Artery Disease; Hypertension; Myocardial Infarction Gastrointestinal Complaints and Symptoms: No Complaints or Symptoms Endocrine Complaints and Symptoms: No Complaints or Symptoms Medical History: Positive for: Type II Diabetes Time with diabetes: 1985 Treated with: Insulin Genitourinary Complaints and Symptoms: No Complaints or Symptoms Medical History: Past Medical History Notes: urostomy Immunological Complaints and Symptoms: No Complaints or Symptoms Integumentary (Skin) Complaints and Symptoms: No Complaints or Symptoms Musculoskeletal Complaints and Symptoms: No Complaints or Symptoms Medical History: Positive for: Osteoarthritis Neurologic Endsley, Prince B. ([id number]) Complaints and Symptoms: No Complaints or Symptoms Medical History: Positive for: Neuropathy Oncologic Complaints and Symptoms: No Complaints or Symptoms Medical History: Past Medical History Notes: bladder cancer - removed but no chemo or radiation Psychiatric Complaints and Symptoms: No Complaints or Symptoms Medical History: Negative for: Anorexia/bulimia; Confinement Anxiety Immunizations Pneumococcal Vaccine: Received Pneumococcal Vaccination: Yes Immunization Notes: up to date Family and Social History Former smoker - 1977; Marital Status - Divorced; Alcohol Use: Never; Drug Use: No History; Caffeine Use: Moderate; Financial Concerns: No; Food, Clothing or Shelter Needs: No; Support System Lacking: No; Transportation Concerns: No; Advanced Directives: Yes (Not  Provided); Patient does not want information on Advanced Directives; Living Will: Yes (Not Provided) Electronic Signature(s) Signed: 01/28/2017 4:47:51 PM By: Linton Ham MD Signed: 01/29/2017 8:26:41 AM By: Gretta Cool, BSN, RN, CWS, Kim RN, BSN Entered By: Gretta Cool, BSN, RN, CWS, Kim on 01/28/2017 13:46:10 Darsey, [name] ([id number]) -------------------------------------------------------------------------------- SuperBill Details Patient Name: [name] Date of Service: 01/28/2017 Medical Record Patient Account Number: [id number] Number: Treating RN: Cornell Barman 04-Sep-1940 (76 y.o. 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 L97.528 Non-pressure chronic ulcer of other part of left foot with other specified severity I87.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 L03.116 Cellulitis of left lower limb Facility Procedures CPT4 Code: 20802233 Description: 99214 - WOUND CARE VISIT-LEV 4 EST PT Modifier: Quantity: 1 Physician Procedures CPT4: Description Modifier Quantity Code [id number] 75300 - WC PHYS LEVEL 4 - EST PT 1 ICD-10 Description Diagnosis 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

## 2017-01-30 NOTE — Progress Notes (Signed)
BAYLEI, SIEBELS ([id number]) Visit Report for 01/28/2017 Abuse/Suicide Risk Screen Details Patient Name: [name], [name] Date of Service: 01/28/2017 2:00 PM Medical Record Patient Account Number: [id number] Number: Treating RN: Cornell Barman 09-16-1940 (76 y.o. Other Clinician: Date of Birth/Sex: Female) Treating ROBSON, MICHAEL Primary Care Ancel Easler: Leighton Ruff Gaylia Kassel/Extender: G Referring Skylyn Slezak: Bethanie Dicker in Treatment: 0 Abuse/Suicide Risk Screen Items Answer ABUSE/SUICIDE RISK SCREEN: Has anyone close to you tried to hurt or harm you recentlyo No Do you feel uncomfortable with anyone in your familyo No Has anyone forced you do things that you didnot want to doo No Do you have any thoughts of harming yourselfo No Patient displays signs or symptoms of abuse and/or neglect. No Electronic Signature(s) Signed: 01/29/2017 8:26:41 AM By: Gretta Cool, BSN, RN, CWS, Kim RN, BSN Entered By: Gretta Cool, BSN, RN, CWS, Kim on 01/28/2017 13:46:22 Snavely, [name] ([id number]) -------------------------------------------------------------------------------- Activities of Daily Living Details Patient Name: [name] Date of Service: 01/28/2017 2:00 PM Medical Record Patient Account Number: [id number] Number: Treating RN: Cornell Barman Apr 26, 1941 (76 y.o. Other Clinician: Date of Birth/Sex: Female) Treating ROBSON, MICHAEL Primary Care Darika Ildefonso: Leighton Ruff Gerrica Cygan/Extender: G Referring Caelyn Route: Bethanie Dicker in Treatment: 0 Activities of Daily Living Items Answer Activities of Daily Living (Please select one for each item) Drive Automobile Not Able Take Medications Completely Able Use Telephone Completely Able Care for Appearance Completely Able Use Toilet Completely Able Bath / Shower Need Assistance Dress Self Need Assistance Feed Self Completely Able Walk Need Assistance Get In / Out Bed Completely Able Housework Need  Assistance Prepare Meals Completely [place] for Self Need Assistance Electronic Signature(s) Signed: 01/29/2017 8:26:41 AM By: Gretta Cool, BSN, RN, CWS, Kim RN, BSN Entered By: Gretta Cool, BSN, RN, CWS, Kim on 01/28/2017 13:47:03 [place], [name] ([id number]) -------------------------------------------------------------------------------- Education Assessment Details Patient Name: [name] Date of Service: 01/28/2017 2:00 PM Medical Record Patient Account Number: [id number] Number: Treating RN: Cornell Barman Jan 25, 1941 (76 y.o. Other Clinician: Date of Birth/Sex: Female) Treating ROBSON, [place] Primary Care Braxon Suder: Leighton Ruff Bazil Dhanani/Extender: G Referring Theran Vandergrift: Bethanie Dicker in Treatment: 0 Primary Learner Assessed: Caregiver Reason Patient is not Primary Learner: wound location Learning Preferences/Education Level/Primary Language Learning Preference: Explanation, Demonstration Highest Education Level: High School Preferred Language: English Cognitive Barrier Assessment/Beliefs Language Barrier: No Translator Needed: No Memory Deficit: No Emotional Barrier: No Cultural/Religious Beliefs Affecting Medical No Care: Physical Barrier Assessment Impaired Vision: No Impaired Hearing: No Decreased Hand dexterity: No Knowledge/Comprehension Assessment Knowledge Level: Medium Comprehension Level: Medium Ability to understand written Medium instructions: Ability to understand verbal Medium instructions: Motivation Assessment Anxiety Level: Calm Cooperation: Cooperative Education Importance: Acknowledges Need Interest in Health Problems: Asks Questions Perception: Coherent Willingness to Engage in Self- Medium Management Activities: ORVA, RILES ([id number]) Readiness to Engage in Self- Management Activities: Electronic Signature(s) Signed: 01/29/2017 8:26:41 AM By: Gretta Cool, BSN, RN, CWS, Kim RN,  BSN Entered By: Gretta Cool, BSN, RN, CWS, Kim on 01/28/2017 13:47:38 MARKA, TRELOAR ([id number]) -------------------------------------------------------------------------------- Fall Risk Assessment Details Patient Name: [name] Date of Service: 01/28/2017 2:00 PM Medical Record Patient Account Number: [id number] Number: Treating RN: Cornell Barman Dec 19, 1940 (76 y.o. Other Clinician: Date of Birth/Sex: Female) Treating ROBSON, MICHAEL Primary Care Billy Turvey: Leighton Ruff Exie Chrismer/Extender: G Referring Danuta Huseman: Bethanie Dicker in Treatment: 0 Fall Risk Assessment Items Have you had 2 or more falls in the last 12 monthso 0 No Have you had any fall that resulted in injury  in the last 12 monthso 0 No FALL RISK ASSESSMENT: History of falling - immediate or within 3 months 0 No Secondary diagnosis 0 No Ambulatory aid None/bed rest/wheelchair/nurse 0 Yes Crutches/cane/walker 0 No Furniture 0 No IV Access/Saline Lock 0 No Gait/Training Normal/bed rest/immobile 0 No Weak 10 Yes Impaired 0 No Mental Status Oriented to own ability 0 Yes Electronic Signature(s) Signed: 01/29/2017 8:26:41 AM By: Gretta Cool, BSN, RN, CWS, Kim RN, BSN Entered By: Gretta Cool, BSN, RN, CWS, Kim on 01/28/2017 13:48:07 Membreno, [name] ([id number]) -------------------------------------------------------------------------------- Foot Assessment Details Patient Name: [name] Date of Service: 01/28/2017 2:00 PM Medical Record Patient Account Number: [id number] Number: Treating RN: Cornell Barman 1941-07-13 (76 y.o. Other Clinician: Date of Birth/Sex: Female) Treating ROBSON, MICHAEL Primary Care Joline Encalada: Leighton Ruff Nyjai Graff/Extender: G Referring Linna Thebeau: Bethanie Dicker in Treatment: 0 Foot Assessment Items Site Locations + = Sensation present, - = Sensation absent, C = Callus, U = Ulcer R = Redness, W = Warmth, M = Maceration, PU = Pre-ulcerative  lesion F = Fissure, S = Swelling, D = Dryness Assessment Right: Left: Other Deformity: No No Prior Foot Ulcer: No No Prior Amputation: No No Charcot Joint: No No Ambulatory Status: Ambulatory With Help Assistance Device: Wheelchair Gait: Architect) Signed: 01/29/2017 8:26:41 AM By: Gretta Cool, BSN, RN, CWS, Kim RN, BSN Entered By: Gretta Cool, BSN, RN, CWS, Kim on 01/28/2017 13:52:41 [place], [name] ([id number]) NURA, CAHOON ([id number]) -------------------------------------------------------------------------------- Nutrition Risk Assessment Details Patient Name: [name] Date of Service: 01/28/2017 2:00 PM Medical Record Patient Account Number: [id number] Number: Treating RN: Cornell Barman 1940-12-21 (76 y.o. Other Clinician: Date of Birth/Sex: Female) Treating ROBSON, MICHAEL Primary Care Germaine Shenker: Leighton Ruff [place] Buttery/Extender: G Referring Darlynn Ricco: Bethanie Dicker in Treatment: 0 Height (in): 63 Weight (lbs): 200 Body Mass Index (BMI): 35.4 Nutrition Risk Assessment Items NUTRITION RISK SCREEN: I have an illness or condition that made me change the kind and/or 0 No amount of food I eat I eat fewer than two meals per day 0 No I eat few fruits and vegetables, or milk products 0 No I have three or more drinks of beer, liquor or wine almost every day 0 No I have tooth or mouth problems that make it hard for me to eat 0 No I don't always have enough money to buy the food I need 0 No I eat alone most of the time 0 No I take three or more different prescribed or over-the-counter drugs a 1 Yes day Without wanting to, I have lost or gained 10 pounds in the last six 0 No months I am not always physically able to shop, cook and/or feed myself 0 No Nutrition Protocols Good Risk Protocol 0 No interventions needed Moderate Risk Protocol Electronic Signature(s) Signed: 01/29/2017 8:26:41 AM By: Gretta Cool, BSN, RN, CWS, Kim RN,  BSN Entered By: Gretta Cool, BSN, RN, CWS, Kim on 01/28/2017 13:48:14

## 2017-01-30 NOTE — Patient Outreach (Signed)
[facility]) Care Management  01/30/2017  AALIYAN BRINKMEIER 21-Oct-1940 [id number]  Called patient to follow up on medication assistance. HIPAA identifers were obtained.  Patient said she had to put her daughter in the hospital but now has her home.    [facility] Funding was used for the patient to purchase her insulin this week.    Patient said she would go and pick it up.  In addition, a new TROOP was requested from [employer].  One was received on Wednesday that listed the patient's out of pocket spend as $880.  Eliquis program requires patients to spend 3% of their income.  Insulin programs require at least $1000.   A new TROOP was requested.  Patient said she would request samples of Eliquis from her cardiologist's office.  Plan:  Follow up with the patient in 1 week.

## 2017-01-31 LAB — AEROBIC CULTURE W GRAM STAIN (SUPERFICIAL SPECIMEN): Culture: NO GROWTH

## 2017-01-31 LAB — AEROBIC CULTURE  (SUPERFICIAL SPECIMEN)

## 2017-02-04 ENCOUNTER — Encounter: Payer: PPO | Admitting: Internal Medicine

## 2017-02-04 DIAGNOSIS — S91105A Unspecified open wound of left lesser toe(s) without damage to nail, initial encounter: Secondary | ICD-10-CM | POA: Diagnosis not present

## 2017-02-04 DIAGNOSIS — E11610 Type 2 diabetes mellitus with diabetic neuropathic arthropathy: Secondary | ICD-10-CM | POA: Diagnosis not present

## 2017-02-04 DIAGNOSIS — I890 Lymphedema, not elsewhere classified: Secondary | ICD-10-CM | POA: Diagnosis not present

## 2017-02-04 DIAGNOSIS — L97422 Non-pressure chronic ulcer of left heel and midfoot with fat layer exposed: Secondary | ICD-10-CM | POA: Diagnosis not present

## 2017-02-05 DIAGNOSIS — I890 Lymphedema, not elsewhere classified: Secondary | ICD-10-CM | POA: Diagnosis not present

## 2017-02-05 NOTE — Progress Notes (Signed)
SHIARA, MCGOUGH ([id number]) Visit Report for 02/04/2017 Arrival Information Details Patient Name: [name], [name] Date of Service: 02/04/2017 3:30 PM Medical Record Number: [id number] Patient Account Number: [id number] Date of Birth/Sex: Aug 22, 1940 (76 y.o. Female) Treating RN: Montey Hora Primary Care Cheyne Bungert: Leighton Ruff Other Clinician: Referring Tamekia Rotter: Leighton Ruff Treating Leira Regino/Extender: Tito Dine in Treatment: 1 Visit Information History Since Last Visit Added or deleted any medications: No Patient Arrived: Wheel Chair Any new allergies or adverse reactions: No Arrival Time: 15:13 Had a fall or experienced change in No activities of daily living that may affect Accompanied By: friend risk of falls: Transfer Assistance: None Signs or symptoms of abuse/neglect since last No Patient Identification Verified: Yes visito Secondary Verification Process Yes Hospitalized since last visit: No Completed: Has Dressing in Place as Prescribed: Yes Patient Has Alerts: Yes Pain Present Now: No Patient Alerts: ABI [place]>220 Electronic Signature(s) Signed: 02/04/2017 4:48:20 PM By: Montey Hora Entered By: Montey Hora on 02/04/2017 15:16:37 Rogala, [name] ([id number]) -------------------------------------------------------------------------------- Encounter Discharge Information Details Patient Name: [name] Date of Service: 02/04/2017 3:30 PM Medical Record Number: [id number] Patient Account Number: [id number] Date of Birth/Sex: 1941/06/24 (76 y.o. Female) Treating RN: Montey Hora Primary Care Korryn Pancoast: Leighton Ruff Other Clinician: Referring Jerald Hennington: Leighton Ruff Treating Jayani Rozman/Extender: Tito Dine in Treatment: 1 Encounter Discharge Information Items Discharge Pain Level: 0 Discharge Condition: Stable Ambulatory Status: Wheelchair Discharge Destination: Home Transportation: Private  Auto Accompanied By: friend Schedule Follow-up Appointment: Yes Medication Reconciliation completed and provided to Patient/Care No Faraaz Wolin: Provided on Clinical Summary of Care: 02/04/2017 Form Type Recipient Paper Patient BI Electronic Signature(s) Signed: 02/04/2017 4:14:51 PM By: Ruthine Dose Entered By: Ruthine Dose on 02/04/2017 16:14:50 Czerwinski, [name] ([id number]) -------------------------------------------------------------------------------- Lower Extremity Assessment Details Patient Name: [name] Date of Service: 02/04/2017 3:30 PM Medical Record Number: [id number] Patient Account Number: [id number] Date of Birth/Sex: June 10, 1941 (76 y.o. Female) Treating RN: Montey Hora Primary Care Ginamarie Banfield: Leighton Ruff Other Clinician: Referring Hayle Parisi: Leighton Ruff Treating Enrique Manganaro/Extender: Tito Dine in Treatment: 1 Vascular Assessment Pulses: Dorsalis Pedis Palpable: [Left:Yes] Posterior Tibial Extremity colors, hair growth, and conditions: Extremity Color: [Left:Red] Hair Growth on Extremity: [Left:No] Temperature of Extremity: [Left:Warm] Capillary Refill: [Left:< 3 seconds] Toe Nail Assessment Left: Right: Thick: Yes Discolored: Yes Deformed: Yes Improper Length and Hygiene: No Electronic Signature(s) Signed: 02/04/2017 4:48:20 PM By: Montey Hora Entered By: Montey Hora on 02/04/2017 15:33:28 Medford, [name] ([id number]) -------------------------------------------------------------------------------- Multi Wound Chart Details Patient Name: [name] Date of Service: 02/04/2017 3:30 PM Medical Record Number: [id number] Patient Account Number: [id number] Date of Birth/Sex: [birth year] (76 y.o. Female) Treating RN: Montey Hora Primary Care Jeris Roser: Leighton Ruff Other Clinician: Referring Dayan Kreis: Leighton Ruff Treating Kivon Aprea/Extender: Tito Dine in Treatment: 1 Vital  Signs Height(in): 63 Pulse(bpm): 55 Weight(lbs): 200 Blood Pressure 137/47 (mmHg): Body Mass Index(BMI): 35 Temperature(F): 97.8 Respiratory Rate 16 (breaths/min): Photos: [3:No Photos] [4:No Photos] [5:No Photos] Wound Location: [3:Left Calcaneus] [4:Left Toe Second] [5:Left Toe Third] Wounding Event: [3:Gradually Appeared] [4:Gradually Appeared] [5:Gradually Appeared] Primary Etiology: [3:Lymphedema] [4:Lymphedema] [5:Lymphedema] Comorbid History: [3:Anemia, Coronary Artery Anemia, Coronary Artery Anemia, Coronary Artery Disease, Hypertension, Myocardial Infarction, Type II Diabetes, Osteoarthritis, Neuropathy Osteoarthritis, Neuropathy Osteoarthritis, Neuropathy] [4:Disease,  Hypertension, Myocardial Infarction, Type II Diabetes,] [5:Disease, Hypertension, Myocardial Infarction, Type II Diabetes,] Date Acquired: [3:01/21/2017] [4:01/21/2017] [5:01/21/2017] Weeks of Treatment: [3:1] [4:1] [5:1] Wound Status: [3:Open] [4:Open] [5:Open] Measurements L x W x D 0.2x0.5x0.1 [4:1x1.7x0.1] [5:0.7x0.7x0.1] (cm) Area (cm) : [3:0.079] [4:1.335] [5:0.385]  Volume (cm) : [3:0.008] [4:0.134] [5:0.038] % Reduction in Area: [3:79.00%] [4:46.00%] [5:23.50%] % Reduction in Volume: 78.90% [4:45.70%] [5:24.00%] Classification: [3:Full Thickness Without Exposed Support Structures] [4:Partial Thickness] [5:Partial Thickness] HBO Classification: [3:Grade 1] [4:Grade 1] [5:Grade 1] Exudate Amount: [3:Large] [4:Large] [5:Large] Exudate Type: [3:Serous] [4:Serous] [5:Serous] Exudate Color: [3:amber] [4:amber] [5:amber] Wound Margin: [3:Flat and Intact] [4:Flat and Intact] [5:Flat and Intact] Granulation Amount: [3:Large (67-100%)] [4:None Present (0%)] [5:None Present (0%)] Granulation Quality: [3:Pink] [4:N/A] [5:N/A] Necrotic Amount: [3:Small (1-33%)] [4:Large (67-100%)] [5:Large (67-100%)] Necrotic Tissue: [3:Adherent Slough] [4:Eschar] [5:Eschar] Exposed Structures: Fascia: No Fascia:  No Fascia: No Fat Layer (Subcutaneous Fat Layer (Subcutaneous Fat Layer (Subcutaneous Tissue) Exposed: No Tissue) Exposed: No Tissue) Exposed: No Tendon: No Tendon: No Tendon: No Muscle: No Muscle: No Muscle: No Joint: No Joint: No Joint: No Bone: No Bone: No Bone: No Epithelialization: Small (1-33%) None None Periwound Skin Texture: Excoriation: No Excoriation: No Excoriation: No Induration: No Induration: No Induration: No Callus: No Callus: No Callus: No Crepitus: No Crepitus: No Crepitus: No Rash: No Rash: No Rash: No Scarring: No Scarring: No Scarring: No Periwound Skin Maceration: No Maceration: No Maceration: No Moisture: Dry/Scaly: No Dry/Scaly: No Dry/Scaly: No Periwound Skin Color: Atrophie Blanche: No Atrophie Blanche: No Atrophie Blanche: No Cyanosis: No Cyanosis: No Cyanosis: No Ecchymosis: No Ecchymosis: No Ecchymosis: No Erythema: No Erythema: No Erythema: No Hemosiderin Staining: No Hemosiderin Staining: No Hemosiderin Staining: No Mottled: No Mottled: No Mottled: No Pallor: No Pallor: No Pallor: No Rubor: No Rubor: No Rubor: No Temperature: No Abnormality No Abnormality No Abnormality Tenderness on No No No Palpation: Wound Preparation: Ulcer Cleansing: Ulcer Cleansing: Ulcer Cleansing: Rinsed/Irrigated with Rinsed/Irrigated with Rinsed/Irrigated with Saline Saline Saline Topical Anesthetic Topical Anesthetic Topical Anesthetic Applied: Other: lidocaine Applied: Other: lidocaine Applied: Other: lidocaine 4% 4% 4% Treatment Notes Electronic Signature(s) Signed: 02/04/2017 4:48:20 PM By: Montey Hora Entered By: Montey Hora on 02/04/2017 15:33:55 Kerney, [name] ([id number]) -------------------------------------------------------------------------------- Multi-Disciplinary Care Plan Details Patient Name: [name] Date of Service: 02/04/2017 3:30 PM Medical Record Number: [id number] Patient Account  Number: [id number] Date of Birth/Sex: 10-12-1940 (76 y.o. Female) Treating RN: Montey Hora Primary Care Mirra Basilio: Leighton Ruff Other Clinician: Referring Elohim Brune: Leighton Ruff Treating Rydan Gulyas/Extender: Tito Dine in Treatment: 1 Active Inactive ` Orientation to the Wound Care Program Nursing Diagnoses: Knowledge deficit related to the wound healing center program Goals: Patient/caregiver will verbalize understanding of the [place] Program Date Initiated: 02/03/2017 Target Resolution Date: 03/05/2017 Goal Status: Active Interventions: Provide education on orientation to the wound center Notes: ` Wound/Skin Impairment Nursing Diagnoses: Knowledge deficit related to ulceration/compromised skin integrity Goals: Ulcer/skin breakdown will heal within 14 weeks Date Initiated: 02/03/2017 Target Resolution Date: 05/09/2017 Goal Status: Active Interventions: Assess patient/caregiver ability to perform ulcer/skin care regimen upon admission and as needed Assess ulceration(s) every visit Treatment Activities: Topical wound management initiated : 01/28/2017 Notes: Electronic Signature(s) ORIYAH, LAMPHEAR ([id number]) Signed: 02/04/2017 4:48:20 PM By: Montey Hora Entered By: Montey Hora on 02/04/2017 15:33:35 Thursby, [name] ([id number]) -------------------------------------------------------------------------------- Pain Assessment Details Patient Name: [name] Date of Service: 02/04/2017 3:30 PM Medical Record Number: [id number] Patient Account Number: [id number] Date of Birth/Sex: 01-23-1941 (76 y.o. Female) Treating RN: Montey Hora Primary Care Alaria Oconnor: Leighton Ruff Other Clinician: Referring Sandip Power: Leighton Ruff Treating Saraiyah Hemminger/Extender: Tito Dine in Treatment: 1 Active Problems Location of Pain Severity and Description of Pain Patient Has Paino No Site Locations Pain Management and  Medication Current Pain Management: Notes Topical or  injectable lidocaine is offered to patient for acute pain when surgical debridement is performed. If needed, Patient is instructed to use over the counter pain medication for the following 24-48 hours after debridement. Wound care MDs do not prescribed pain medications. Patient has chronic pain or uncontrolled pain. Patient has been instructed to make an appointment with their Primary Care Physician for pain management. Electronic Signature(s) Signed: 02/04/2017 4:48:20 PM By: Montey Hora Entered By: Montey Hora on 02/04/2017 15:16:46 Sublette, [name] ([id number]) -------------------------------------------------------------------------------- Patient/Caregiver Education Details Patient Name: [name] Date of Service: 02/04/2017 3:30 PM Medical Record Patient Account Number: [id number] Number: Treating RN: Montey Hora 05-05-1941 (76 y.o. Other Clinician: Date of Birth/Gender: Female) Treating ROBSON, MICHAEL Primary Care Physician: Leighton Ruff Physician/Extender: G Referring Physician: Bethanie Dicker in Treatment: 1 Education Assessment Education Provided To: Patient and Caregiver Education Topics Provided Venous: Handouts: Other: leg elevation Methods: Explain/Verbal Responses: State content correctly Electronic Signature(s) Signed: 02/04/2017 4:48:20 PM By: Montey Hora Entered By: Montey Hora on 02/04/2017 15:46:01 Cindric, [name] ([id number]) -------------------------------------------------------------------------------- Wound Assessment Details Patient Name: [name] Date of Service: 02/04/2017 3:30 PM Medical Record Number: [id number] Patient Account Number: [id number] Date of Birth/Sex: 15-Mar-1941 (76 y.o. Female) Treating RN: Montey Hora Primary Care Vanshika Jastrzebski: Leighton Ruff Other Clinician: Referring Rylend Pietrzak: Leighton Ruff Treating  Allex Lapoint/Extender: Tito Dine in Treatment: 1 Wound Status Wound Number: 3 Primary Lymphedema Etiology: Wound Location: Left Calcaneus Wound Open Wounding Event: Gradually Appeared Status: Date Acquired: 01/21/2017 Comorbid Anemia, Coronary Artery Disease, Weeks Of Treatment: 1 History: Hypertension, Myocardial Infarction, Clustered Wound: No Type II Diabetes, Osteoarthritis, Neuropathy Photos Photo Uploaded By: Montey Hora on 02/04/2017 16:40:19 Wound Measurements Length: (cm) 0.2 Width: (cm) 0.5 Depth: (cm) 0.1 Area: (cm) 0.079 Volume: (cm) 0.008 % Reduction in Area: 79% % Reduction in Volume: 78.9% Epithelialization: Small (1-33%) Tunneling: No Undermining: No Wound Description Full Thickness Without Foul Odor Afte Classification: Exposed Support Structures Slough/Fibrino Diabetic Severity Grade 1 (Wagner): Wound Margin: Flat and Intact Exudate Amount: Large Exudate Type: Serous Exudate Color: amber r Cleansing: No Yes Wound Bed Mittelman, Abrar B. ([id number]) Granulation Amount: Large (67-100%) Exposed Structure Granulation Quality: Pink Fascia Exposed: No Necrotic Amount: Small (1-33%) Fat Layer (Subcutaneous Tissue) Exposed: No Necrotic Quality: Adherent Slough Tendon Exposed: No Muscle Exposed: No Joint Exposed: No Bone Exposed: No Periwound Skin Texture Texture Color No Abnormalities Noted: No No Abnormalities Noted: No Callus: No Atrophie Blanche: No Crepitus: No Cyanosis: No Excoriation: No Ecchymosis: No Induration: No Erythema: No Rash: No Hemosiderin Staining: No Scarring: No Mottled: No Pallor: No Moisture Rubor: No No Abnormalities Noted: No Dry / Scaly: No Temperature / Pain Maceration: No Temperature: No Abnormality Wound Preparation Ulcer Cleansing: Rinsed/Irrigated with Saline Topical Anesthetic Applied: Other: lidocaine 4%, Treatment Notes Wound #3 (Left Calcaneus) 1. Cleansed with: Cleanse wound  with antibacterial soap and water 2. Anesthetic Topical Lidocaine 4% cream to wound bed prior to debridement 4. Dressing Applied: Aquacel Ag 5. Secondary Dressing Applied ABD Pad Notes Kerlix and Coban base of toes to just below knee. foam used to protect toes Electronic Signature(s) Signed: 02/04/2017 4:48:20 PM By: Montey Hora Entered By: Montey Hora on 02/04/2017 15:32:35 Stella, [name] ([id number]) -------------------------------------------------------------------------------- Wound Assessment Details Patient Name: [name] Date of Service: 02/04/2017 3:30 PM Medical Record Number: [id number] Patient Account Number: [id number] Date of Birth/Sex: Feb 17, 1941 (76 y.o. Female) Treating RN: Montey Hora Primary Care Kay Ricciuti: Leighton Ruff Other Clinician: Referring Deziya Amero: Leighton Ruff Treating Hulon Ferron/Extender: Ricard Dillon  Weeks in Treatment: 1 Wound Status Wound Number: 4 Primary Lymphedema Etiology: Wound Location: Left Toe Second Wound Open Wounding Event: Gradually Appeared Status: Date Acquired: 01/21/2017 Comorbid Anemia, Coronary Artery Disease, Weeks Of Treatment: 1 History: Hypertension, Myocardial Infarction, Clustered Wound: No Type II Diabetes, Osteoarthritis, Neuropathy Photos Photo Uploaded By: Montey Hora on 02/04/2017 16:40:19 Wound Measurements Length: (cm) 1 Width: (cm) 1.7 Depth: (cm) 0.1 Area: (cm) 1.335 Volume: (cm) 0.134 % Reduction in Area: 46% % Reduction in Volume: 45.7% Epithelialization: None Tunneling: No Undermining: No Wound Description Classification: Partial Thickness Foul Odor A Diabetic Severity (Wagner): Grade 1 Slough/Fibr Wound Margin: Flat and Intact Exudate Amount: Large Exudate Type: Serous Exudate Color: amber fter Cleansing: No ino No Wound Bed Granulation Amount: None Present (0%) Exposed Structure Necrotic Amount: Large (67-100%) Fascia Exposed: No Bayless, Emeli B.  ([id number]) Necrotic Quality: Eschar Fat Layer (Subcutaneous Tissue) Exposed: No Tendon Exposed: No Muscle Exposed: No Joint Exposed: No Bone Exposed: No Periwound Skin Texture Texture Color No Abnormalities Noted: No No Abnormalities Noted: No Callus: No Atrophie Blanche: No Crepitus: No Cyanosis: No Excoriation: No Ecchymosis: No Induration: No Erythema: No Rash: No Hemosiderin Staining: No Scarring: No Mottled: No Pallor: No Moisture Rubor: No No Abnormalities Noted: No Dry / Scaly: No Temperature / Pain Maceration: No Temperature: No Abnormality Wound Preparation Ulcer Cleansing: Rinsed/Irrigated with Saline Topical Anesthetic Applied: Other: lidocaine 4%, Treatment Notes Wound #4 (Left Toe Second) 1. Cleansed with: Cleanse wound with antibacterial soap and water 2. Anesthetic Topical Lidocaine 4% cream to wound bed prior to debridement 4. Dressing Applied: Aquacel Ag 5. Secondary Dressing Applied ABD Pad Notes Kerlix and Coban base of toes to just below knee. foam used to protect toes Electronic Signature(s) Signed: 02/04/2017 4:48:20 PM By: Montey Hora Entered By: Montey Hora on 02/04/2017 15:32:49 Brose, [name] ([id number]) -------------------------------------------------------------------------------- Wound Assessment Details Patient Name: [name] Date of Service: 02/04/2017 3:30 PM Medical Record Number: [id number] Patient Account Number: [id number] Date of Birth/Sex: Jul 01, 1941 (76 y.o. Female) Treating RN: Montey Hora Primary Care Cambrey Lupi: Leighton Ruff Other Clinician: Referring Roneisha Stern: Leighton Ruff Treating Tamelia Michalowski/Extender: Tito Dine in Treatment: 1 Wound Status Wound Number: 5 Primary Lymphedema Etiology: Wound Location: Left Toe Third Wound Open Wounding Event: Gradually Appeared Status: Date Acquired: 01/21/2017 Comorbid Anemia, Coronary Artery Disease, Weeks Of Treatment: 1 History:  Hypertension, Myocardial Infarction, Clustered Wound: No Type II Diabetes, Osteoarthritis, Neuropathy Photos Photo Uploaded By: Montey Hora on 02/04/2017 16:42:20 Wound Measurements Length: (cm) 0.7 Width: (cm) 0.7 Depth: (cm) 0.1 Area: (cm) 0.385 Volume: (cm) 0.038 % Reduction in Area: 23.5% % Reduction in Volume: 24% Epithelialization: None Tunneling: No Undermining: No Wound Description Classification: Partial Thickness Foul Odor A Diabetic Severity (Wagner): Grade 1 Slough/Fibr Wound Margin: Flat and Intact Exudate Amount: Large Exudate Type: Serous Exudate Color: amber fter Cleansing: No ino No Wound Bed Granulation Amount: None Present (0%) Exposed Structure Necrotic Amount: Large (67-100%) Fascia Exposed: No Charrier, Minie B. ([id number]) Necrotic Quality: Eschar Fat Layer (Subcutaneous Tissue) Exposed: No Tendon Exposed: No Muscle Exposed: No Joint Exposed: No Bone Exposed: No Periwound Skin Texture Texture Color No Abnormalities Noted: No No Abnormalities Noted: No Callus: No Atrophie Blanche: No Crepitus: No Cyanosis: No Excoriation: No Ecchymosis: No Induration: No Erythema: No Rash: No Hemosiderin Staining: No Scarring: No Mottled: No Pallor: No Moisture Rubor: No No Abnormalities Noted: No Dry / Scaly: No Temperature / Pain Maceration: No Temperature: No Abnormality Wound Preparation Ulcer Cleansing: Rinsed/Irrigated with Saline Topical Anesthetic Applied: Other:  lidocaine 4%, Treatment Notes Wound #5 (Left Toe Third) 1. Cleansed with: Cleanse wound with antibacterial soap and water 2. Anesthetic Topical Lidocaine 4% cream to wound bed prior to debridement 4. Dressing Applied: Aquacel Ag 5. Secondary Dressing Applied ABD Pad Notes Kerlix and Coban base of toes to just below knee. foam used to protect toes Electronic Signature(s) Signed: 02/04/2017 4:48:20 PM By: Montey Hora Entered By: Montey Hora on 02/04/2017  15:33:02 Singleton, [name] ([id number]) -------------------------------------------------------------------------------- Vitals Details Patient Name: [name] Date of Service: 02/04/2017 3:30 PM Medical Record Number: [id number] Patient Account Number: [id number] Date of Birth/Sex: Jun 25, 1941 (76 y.o. Female) Treating RN: Montey Hora Primary Care Lauriel Helin: Leighton Ruff Other Clinician: Referring Rian Busche: Leighton Ruff Treating Ronita Hargreaves/Extender: Tito Dine in Treatment: 1 Vital Signs Time Taken: 15:16 Temperature (F): 97.8 Height (in): 63 Pulse (bpm): 55 Weight (lbs): 200 Respiratory Rate (breaths/min): 16 Body Mass Index (BMI): 35.4 Blood Pressure (mmHg): 137/47 Reference Range: 80 - 120 mg / dl Electronic Signature(s) Signed: 02/04/2017 4:48:20 PM By: Montey Hora Entered By: Montey Hora on 02/04/2017 15:18:22

## 2017-02-05 NOTE — Progress Notes (Addendum)
[name] ([id number]) Visit Report for 02/04/2017 Debridement Details Patient Name: [name], [name] Date of Service: 02/04/2017 3:30 PM Medical Record Patient Account Number: [id number] Number: Treating RN: Montey Hora [birth year] (76 y.o. Other Clinician: Date of Birth/Sex: Female) Treating Hadas Jessop Primary Care Provider: Leighton Ruff Provider/Extender: G Referring Provider: Bethanie Dicker in Treatment: 1 Debridement Performed for Wound #5 Left Toe Third Assessment: Performed By: Physician Ricard Dillon, MD Debridement: Open Wound/Selective Debridement Selective Description: Pre-procedure Verification/Time Out Yes - 15:52 Taken: Start Time: 15:52 Pain Control: Lidocaine 4% Topical Solution Level: Non-Viable Tissue Total Area Debrided (L x 0.7 (cm) x 0.7 (cm) = 0.49 (cm) W): Tissue and other Viable, Non-Viable, Eschar material debrided: Instrument: Curette Bleeding: None End Time: 15:54 Procedural Pain: 0 Post Procedural Pain: 0 Response to Treatment: Procedure was tolerated well Post Debridement Measurements of Total Wound Length: (cm) 0.7 Width: (cm) 0.7 Depth: (cm) 0.1 Volume: (cm) 0.038 Character of Wound/Ulcer Post Improved Debridement: Post Procedure Diagnosis Same as Pre-procedure [name] ([id number]) Electronic Signature(s) Signed: 02/05/2017 1:05:37 PM By: Montey Hora Signed: 02/05/2017 5:06:34 PM By: Linton Ham MD Previous Signature: 02/04/2017 4:48:20 PM Version By: Montey Hora Previous Signature: 02/04/2017 4:49:17 PM Version By: Linton Ham MD Entered By: Montey Hora on 02/05/2017 13:05:37 [name] ([id number]) -------------------------------------------------------------------------------- HPI Details Patient Name: [name] Date of Service: 02/04/2017 3:30 PM Medical Record Patient Account Number: [id number] Number: Treating RN: Montey Hora 1941-07-21 (76 y.o. Other Clinician: Date of Birth/Sex: Female) Treating Ivone Licht Primary Care Provider: Leighton Ruff Provider/Extender: G Referring Provider: Bethanie Dicker in Treatment: 1 History of Present Illness HPI Description: 08/21/16; [name] is a 76 year old woman who I cared for in our [facility] clinic up until January 2016. At that point she had a wound in her left great toe. 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 He follows with vein and vascular and has a follow-up appointment in early February. She is status left femoral to anterior tibial bypass in February 2016. She is also noted to have venous stasis changes and a history of ulcers. 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 fib on Eliquis. She has bladder cancer and has a chronic urostomy. The patient tells me that 2 weeks ago she had a right prosthesis and a new left diabetic shoe. Roughly one week ago she noted a wound on her left lateral heel. She zinc oxide and some silver alginate she had left over until she could be seen here. It was also noted that she has had blistering on her posterior left calf and an open area here as well. Her records note that she has chronic venous insufficiency with chronic inflammation especially in the left mid calf area. The patient has not been systemically unwell no fever or chills. She is reasonably insensate and has not felt any pain 09/04/16; the patient has a fairly substantial wound on the left lateral calcaneus. We have been using Santyl to this area. Blister on the left lower leg appears to of healed the patient also follows with Dr. Sherren Mocha early of vein  and vascular. He felt that she should have enough blood flow  to heal her left heel wound. He renewed her doxycycline for a further week. She is status post left femoral to anterior tibial bypass in February 2016. 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 She has a remote history of a right BKA. The XRAY that I ordered last week showed no definite evidence of osteomyelitis however a small focus of demineralization was noted in the plantar aspect of the calcaneus which might warrant an MRI possibly indicating early osteomyelitis however this is not in the area of her wound and I'm going to keep this under advisement for now. 09/11/16; still a substantial wound in the left lateral calcaneus we've been using Santyl 09/18/16; still a substantial wound on the left lateral calcaneus. We are still using Santyl 09/24/16; still a substantial wound on the left lateral calcaneus. We're using Santyl change one or 2 times a week and mechanical debridement which he comes to [place]. 10/02/16; left lateral calcaneus using Santyl changing 3 times a week. Run Apligraf through her insurance 10/09/16; still using Santyl without much change. Being changed 2 times a week by the patient and once Sease, Pearson B. ([id number]) here. We did not run Apligraf last week we'll do that this week 10/16/16; the area on her left lateral heel; started Iodoflex last week. Apligraf set a co-pay of $347 per for application 12/04/93; left lateral heel. on iodoflex. approved for appligraft, start next week 10/30/16 Apligraf #1 today 11/13/16- patient is here for follow up evaluation of her left lateral calcaneus ulcer. She had Apligraf #1 placed 2 weeks ago. She is tolerating compression. She is voicing no complaints or concerns. She did return last week for nurse visit. 11/27/16; dramatic improvement in the overall appearance of this wound. What looks to be a tinea infection around this. Apligraf #3 12/11/16; only a small healthy granulated  oval wound remains on the lateral aspect of her calcaneus. I think this is probably too small to consider an Apligraf that this point although I gave this some thought. 12/18/16; wound is smaller but still not closed. We'll use Hydrofera Blue last week. Switch to Promogran this week 12/25/16 Wound is totally epithelialized. No surrounding erythema. This was originally a pressure injury at the time of new footwear on her righ prosthesis She has PAD for which she follows with Dr. Donnetta Hutching 01/28/17 READMISSION this is a patient with type 2 diabetes and complications including PAD, neuropathy. Prior history of foot wounds with an amputation of the left great toe and right BKA. We had her in clinic up until a month ago with a very difficult wound on the lateral left heel. She was eventually healed out after a prolonged course of Apligraf. She did well apparently recently she states she was washing her leg and bathing her leg and noted some skin come off her foot. She is also recently noted blisters on her left calf. She states she does not have compression stockings which is at odds with my last note when we discharged her on 12/25/16. In any case she is here for review in view of multiple concerns on the left leg and foot. She has not been using her foot wear using only the healing sandal that we gave her. She follows with Dr. early for her PAD and tells me/reminds me that the last time she saw him he told her that her vascular supply with quite good I'll need to review this. 6/66/18; patient's wounds looks somewhat better today. Culture I did last week on the  left second toe was negative she is completed antibiotics. She had a sanguinous blister on her left third toe which I debridement with a curette today so we can probably dresser wound. The area on the left lateral heel looks as though it's progressing towards closure or edema control is better and the blisters on her anterior leg are also improved. We  have been using silver alginate all her wounds Electronic Signature(s) Signed: 02/04/2017 4:49:17 PM By: Linton Ham MD Entered By: Linton Ham on 02/04/2017 16:30:17 Gottschall, Bea [name] ([id number]) -------------------------------------------------------------------------------- Physical Exam Details Patient Name: [name] Date of Service: 02/04/2017 3:30 PM Medical Record Patient Account Number: [id number] Number: Treating RN: Montey Hora 05/02/1941 (76 y.o. Other Clinician: Date of Birth/Sex: Female) Treating Suezette Lafave Primary Care Provider: Leighton Ruff Provider/Extender: G Referring Provider: Leighton Ruff Weeks in Treatment: 1 Constitutional Sitting or standing Blood Pressure is within target range for patient.. Pulse regular and within target range for patient.Marland Kitchen Respirations regular, non-labored and within target range.. Temperature is normal and within the target range for the patient.Marland Kitchen appears in no distress. Eyes Conjunctivae clear. No discharge. Respiratory Respiratory effort is easy and symmetric bilaterally. Rate is normal at rest and on room air.. Cardiovascular Pedal pulses palpable on the left. Edema in the left leg is better. Lymphatic None palpable in the left popliteal or inguinal area. Psychiatric No evidence of depression, anxiety, or agitation. Calm, cooperative, and communicative. Appropriate interactions and affect.. Notes Wound exam; the blistering in her left leg is better with better edema control. oThe concerning wound on the left lateral foot is also smaller healthy looking. oArea on the left second toe looks better this is on the dorsal aspect. oArea on the plantar third toe debridement with a #3 curet to open a blister necrotic wound surface Electronic Signature(s) Signed: 02/04/2017 4:49:17 PM By: Linton Ham MD Entered By: Linton Ham on 02/04/2017 16:32:20 Clasby, Bea [name]  ([id number]) -------------------------------------------------------------------------------- Physician Orders Details Patient Name: [name] Date of Service: 02/04/2017 3:30 PM Medical Record Patient Account Number: [id number] Number: Treating RN: Montey Hora Sep 27, 1940 (76 y.o. Other Clinician: Date of Birth/Sex: Female) Treating Tersea Aulds Primary Care Provider: Leighton Ruff Provider/Extender: G Referring Provider: Bethanie Dicker in Treatment: 1 Verbal / Phone Orders: No Diagnosis Coding Wound Cleansing Wound #3 Left Calcaneus o Clean wound with Normal Saline. o Cleanse wound with mild soap and water Wound #4 Left Toe Second o Clean wound with Normal Saline. o Cleanse wound with mild soap and water Wound #5 Left Toe Third o Clean wound with Normal Saline. o Cleanse wound with mild soap and water Anesthetic Wound #3 Left Calcaneus o Topical Lidocaine 4% cream applied to wound bed prior to debridement Wound #4 Left Toe Second o Topical Lidocaine 4% cream applied to wound bed prior to debridement Wound #5 Left Toe Third o Topical Lidocaine 4% cream applied to wound bed prior to debridement Primary Wound Dressing Wound #3 Left Calcaneus o Aquacel Ag Wound #4 Left Toe Second o Aquacel Ag Wound #5 Left Toe Third o Aquacel Ag Secondary Dressing Wound #3 Left Calcaneus Gritton, Margareta B. ([id number]) o Kerlix and Coban Wound #4 Left Toe Second o Kerlix and Coban o Foam Wound #5 Left Toe Third o Kerlix and Coban o Foam Follow-up Appointments Wound #3 Left Calcaneus o Return Appointment in 1 week. Wound #4 Left Toe Second o Return Appointment in 1 week. Wound #5 Left Toe Third o Return Appointment in 1 week. Edema Control  Wound #3 Left Calcaneus o Elevate legs to the level of the heart and pump ankles as often as possible Wound #4 Left Toe Second o Elevate legs to the level of the  heart and pump ankles as often as possible Wound #5 Left Toe Third o Elevate legs to the level of the heart and pump ankles as often as possible Electronic Signature(s) Signed: 02/04/2017 4:48:20 PM By: Montey Hora Signed: 02/04/2017 4:49:17 PM By: Linton Ham MD Entered By: Montey Hora on 02/04/2017 16:32:53 Rathje, Bea [name] ([id number]) -------------------------------------------------------------------------------- Problem List Details Patient Name: [name] Date of Service: 02/04/2017 3:30 PM Medical Record Patient Account Number: [id number] Number: Treating RN: Montey Hora 15-Feb-1941 (76 y.o. 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 L97.528 Non-pressure chronic ulcer of other part of left foot with 01/28/2017 Yes other specified severity I87.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 hypertension (idiopathic) with 01/28/2017 Yes inflammation of left lower extremity L03.116 Cellulitis of left lower limb 01/28/2017 Yes Inactive Problems Resolved Problems KESHIA, WEARE ([id number]) Electronic Signature(s) Signed: 02/04/2017 4:49:17 PM By: Linton Ham MD Entered By: Linton Ham on 02/04/2017 16:25:51 Sandefur, Bea [name] ([id number]) -------------------------------------------------------------------------------- Progress Note Details Patient Name: [name] Date of  Service: 02/04/2017 3:30 PM Medical Record Patient Account Number: [id number] Number: Treating RN: Montey Hora 1941/05/22 (76 y.o. Other Clinician: Date of Birth/Sex: Female) Treating Windi Toro Primary Care Provider: Leighton Ruff Provider/Extender: G Referring Provider: Bethanie Dicker in Treatment: 1 Subjective History of Present Illness (HPI) 08/21/16; Mrs. Sakurai is a 76 year old woman who I cared for in our [facility] clinic up until January 2016. At that point she had a wound in her left great toe. 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 He follows with vein and vascular and has a follow-up appointment in early February. She is status left femoral to anterior tibial bypass in February 2016. She is also noted to have venous stasis changes and a history of ulcers. 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 fib on Eliquis. She has bladder cancer and has a chronic urostomy. The patient tells me that 2 weeks ago she had a right prosthesis and a new left diabetic shoe. Roughly one week ago she noted a wound on her left lateral heel. She zinc oxide and some silver alginate she had left over until she could be seen here. It was also noted that she has had blistering on her posterior left calf and an open area here as well. Her records note that she has chronic venous insufficiency with chronic inflammation especially in the left mid calf area. The patient has not been systemically unwell no fever or chills. She is reasonably insensate and has not felt any pain 09/04/16; the patient has a fairly substantial  wound on the left lateral calcaneus. We have been using  Santyl to this area. Blister on the left lower leg appears to of healed the patient also follows with Dr. Sherren Mocha early of vein and vascular. He felt that she should have enough blood flow to heal her left heel wound. He renewed her doxycycline for a further week. She is status post left femoral to anterior tibial bypass in February 2016. 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 She has a remote history of a right BKA. The XRAY that I ordered last week showed no definite evidence of osteomyelitis however a small focus of demineralization was noted in the plantar aspect of the calcaneus which might warrant an MRI possibly indicating early osteomyelitis however this is not in the area of her wound and I'm going to keep this under advisement for now. 09/11/16; still a substantial wound in the left lateral calcaneus we've been using Santyl 09/18/16; still a substantial wound on the left lateral calcaneus. We are still using Santyl 09/24/16; still a substantial wound on the left lateral calcaneus. We're using Santyl change one or 2 times a week and mechanical debridement which he comes to [place]. 10/02/16; left lateral calcaneus using Santyl changing 3 times a week. Run Apligraf through her insurance 10/09/16; still using Santyl without much change. Being changed 2 times a week by the patient and once Guttierrez, Tameria B. ([id number]) here. We did not run Apligraf last week we'll do that this week 10/16/16; the area on her left lateral heel; started Iodoflex last week. Apligraf set a co-pay of $010 per for application 2/72/53; left lateral heel. on iodoflex. approved for appligraft, start next week 10/30/16 Apligraf #1 today 11/13/16- patient is here for follow up evaluation of her left lateral calcaneus ulcer. She had Apligraf #1 placed 2 weeks ago. She is tolerating compression. She is voicing no complaints or concerns. She did return last week for nurse visit. 11/27/16; dramatic improvement in the overall  appearance of this wound. What looks to be a tinea infection around this. Apligraf #3 12/11/16; only a small healthy granulated oval wound remains on the lateral aspect of her calcaneus. I think this is probably too small to consider an Apligraf that this point although I gave this some thought. 12/18/16; wound is smaller but still not closed. We'll use Hydrofera Blue last week. Switch to Promogran this week 12/25/16 Wound is totally epithelialized. No surrounding erythema. This was originally a pressure injury at the time of new footwear on her righ prosthesis She has PAD for which she follows with Dr. Donnetta Hutching 01/28/17 READMISSION this is a patient with type 2 diabetes and complications including PAD, neuropathy. Prior history of foot wounds with an amputation of the left great toe and right BKA. We had her in clinic up until a month ago with a very difficult wound on the lateral left heel. She was eventually healed out after a prolonged course of Apligraf. She did well apparently recently she states she was washing her leg and bathing her leg and noted some skin come off her foot. She is also recently noted blisters on her left calf. She states she does not have compression stockings which is at odds with my last note when we discharged her on 12/25/16. In any case she is here for review in view of multiple concerns on the left leg and foot. She has not been using her foot wear using only the healing sandal that we gave her. She follows  with Dr. early for her PAD and tells me/reminds me that the last time she saw him he told her that her vascular supply with quite good I'll need to review this. 6/66/18; patient's wounds looks somewhat better today. Culture I did last week on the left second toe was negative she is completed antibiotics. She had a sanguinous blister on her left third toe which I debridement with a curette today so we can probably dresser wound. The area on the left lateral heel looks as  though it's progressing towards closure or edema control is better and the blisters on her anterior leg are also improved. We have been using silver alginate all her wounds Objective Constitutional Sitting or standing Blood Pressure is within target range for patient.. Pulse regular and within target range for patient.Marland Kitchen Respirations regular, non-labored and within target range.. Temperature is normal and within the target range for the patient.Marland Kitchen appears in no distress. Corson, Norabelle B. ([id number]) Vitals Time Taken: 3:16 PM, Height: 63 in, Weight: 200 lbs, BMI: 35.4, Temperature: 97.8 F, Pulse: 55 bpm, Respiratory Rate: 16 breaths/min, Blood Pressure: 137/47 mmHg. Eyes Conjunctivae clear. No discharge. Respiratory Respiratory effort is easy and symmetric bilaterally. Rate is normal at rest and on room air.. Cardiovascular Pedal pulses palpable on the left. Edema in the left leg is better. Lymphatic None palpable in the left popliteal or inguinal area. Psychiatric No evidence of depression, anxiety, or agitation. Calm, cooperative, and communicative. Appropriate interactions and affect.. General Notes: Wound exam; the blistering in her left leg is better with better edema control. The concerning wound on the left lateral foot is also smaller healthy looking. Area on the left second toe looks better this is on the dorsal aspect. Area on the plantar third toe debridement with a #3 curet to open a blister necrotic wound surface Integumentary (Hair, Skin) Wound #3 status is Open. Original cause of wound was Gradually Appeared. The wound is located on the Left Calcaneus. The wound measures 0.2cm length x 0.5cm width x 0.1cm depth; 0.079cm^2 area and 0.008cm^3 volume. There is no tunneling or undermining noted. There is a large amount of serous drainage noted. The wound margin is flat and intact. There is large (67-100%) pink granulation within the wound bed. There is a small (1-33%)  amount of necrotic tissue within the wound bed including Adherent Slough. The periwound skin appearance did not exhibit: Callus, Crepitus, Excoriation, Induration, Rash, Scarring, Dry/Scaly, Maceration, Atrophie Blanche, Cyanosis, Ecchymosis, Hemosiderin Staining, Mottled, Pallor, Rubor, Erythema. Periwound temperature was noted as No Abnormality. Wound #4 status is Open. Original cause of wound was Gradually Appeared. The wound is located on the Left Toe Second. The wound measures 1cm length x 1.7cm width x 0.1cm depth; 1.335cm^2 area and 0.134cm^3 volume. There is no tunneling or undermining noted. There is a large amount of serous drainage noted. The wound margin is flat and intact. There is no granulation within the wound bed. There is a large (67-100%) amount of necrotic tissue within the wound bed including Eschar. The periwound skin appearance did not exhibit: Callus, Crepitus, Excoriation, Induration, Rash, Scarring, Dry/Scaly, Maceration, Atrophie Blanche, Cyanosis, Ecchymosis, Hemosiderin Staining, Mottled, Pallor, Rubor, Erythema. Periwound temperature was noted as No Abnormality. Wound #5 status is Open. Original cause of wound was Gradually Appeared. The wound is located on the Left Toe Third. The wound measures 0.7cm length x 0.7cm width x 0.1cm depth; 0.385cm^2 area and 0.038cm^3 volume. There is no tunneling or undermining noted. There is a large amount of  serous drainage noted. The wound margin is flat and intact. There is no granulation within the wound bed. There is a large (67-100%) amount of necrotic tissue within the wound bed including Eschar. The periwound skin appearance did not exhibit: Callus, Crepitus, Excoriation, Induration, Rash, Scarring, Dry/Scaly, Maceration, Bennick, Carle B. ([id number]) Atrophie Blanche, Cyanosis, Ecchymosis, Hemosiderin Staining, Mottled, Pallor, Rubor, Erythema. Periwound temperature was noted as No Abnormality. 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 There was a Non- Viable Tissue Open Wound/Selective [phone]) debridement with total area of 0.49 sq cm performed by Ricard Dillon, MD. with the following instrument(s): Curette to remove Viable and Non-Viable tissue/material including Eschar after achieving pain control using Lidocaine 4% Topical Solution. A time out was conducted at 15:52, prior to the start of the procedure. There was no bleeding. The procedure was tolerated well with a pain level of 0 throughout and a pain level of 0 following the procedure. Post Debridement Measurements: 0.7cm length x 0.7cm width x 0.1cm depth; 0.038cm^3 volume. Character of Wound/Ulcer Post Debridement is improved. Post procedure Diagnosis Wound #5: Same as Pre-Procedure Plan Wound Cleansing: Wound #3 Left Calcaneus: Clean wound with Normal Saline. Cleanse wound with mild soap and water Wound #4 Left Toe Second: Diep, Nehal B. ([id number]) Clean wound with Normal Saline. Cleanse wound with mild soap and water Wound #5 Left Toe Third: Clean wound with Normal Saline. Cleanse wound with mild soap and water Anesthetic: Wound #3 Left Calcaneus: Topical Lidocaine 4% cream applied to wound bed prior to debridement Wound #4 Left Toe Second: Topical Lidocaine 4% cream applied to wound  bed prior to debridement Wound #5 Left Toe Third: Topical Lidocaine 4% cream applied to wound bed prior to debridement Primary Wound Dressing: Wound #3 Left Calcaneus: Aquacel Ag Wound #4 Left Toe Second: Aquacel Ag Wound #5 Left Toe Third: Aquacel Ag Secondary Dressing: Wound #3 Left Calcaneus: Kerlix and Coban Wound #4 Left Toe Second: Kerlix and Coban Wound #5 Left Toe Third: Kerlix and Coban Follow-up Appointments: Wound #3 Left Calcaneus: Return Appointment in 1 week. Wound #4 Left Toe Second: Return Appointment in 1 week. Wound #5 Left Toe Third: Return Appointment in 1 week. Edema Control: Wound #3 Left Calcaneus: Elevate legs to the level of the heart and pump ankles as often as possible Wound #4 Left Toe Second: Elevate legs to the level of the heart and pump ankles as often as possible Wound #5 Left Toe Third: Elevate legs to the level of the heart and pump ankles as often as possible #1 I'm going to continue his silver alginate to the left second toe left third toe lateral left heel #2 Kerlix and Coban Pain, Barri B. ([id number]) #3 need to review vascular status followed by Dr. Tawni Millers #4 I will need to review what she has in terms of pressure stockings. She will likely need a juxta light stocking before she is discharged from the clinic this time Electronic Signature(s)  Signed: 02/07/2017 4:31:15 PM By: Gretta Cool, BSN, RN, CWS, Kim RN, BSN Signed: 02/13/2017 12:39:04 PM By: Linton Ham MD Previous Signature: 02/04/2017 4:49:17 PM Version By: Linton Ham MD Entered By: Gretta Cool, BSN, RN, CWS, Kim on 02/07/2017 16:31:15 KESSA, FAIRBAIRN ([id number]) -------------------------------------------------------------------------------- [place] Details Patient Name: [name] Date of Service: 02/04/2017 Medical Record Patient Account Number: [id number] Number: Treating RN: Montey Hora 1940/10/05 (76 y.o. 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.528 Non-pressure chronic ulcer of other part of left foot with other specified severity 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 L03.116 Cellulitis of left lower limb Facility Procedures CPT4: Description Modifier Quantity Code [id number] - DEBRIDE WOUND 1ST 20 SQ CM OR < 1 ICD-10 Description Diagnosis 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 L97.528 Non-pressure chronic ulcer of other part  of left foot with other specified severity Physician Procedures CPT4: Description Modifier Quantity Code [id number] - WC PHYS DEBR WO ANESTH 20 SQ CM 1 ICD-10 Description Diagnosis 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

## 2017-02-06 ENCOUNTER — Other Ambulatory Visit: Payer: Self-pay | Admitting: Pharmacist

## 2017-02-06 ENCOUNTER — Ambulatory Visit: Payer: Self-pay | Admitting: Pharmacist

## 2017-02-07 NOTE — Patient Outreach (Signed)
[facility]) Care Management  02/06/2017     [name] 1941/05/21 [id number]   Patient was called to follow up on Medication Assistance. No answer. HIPAA compliant message left on the patient's voicemail.  Patient Assistance Application for Eliquis sent to Dr. Marlou Porch office via fax 02/06/17.   Plan:  Follow up on forms and fax paperwork to [employer] in 5-7 business day.  Follow up with patient in 5-7 business days.   [name], PharmD, [place] Clinical Pharmacist [phone]

## 2017-02-10 ENCOUNTER — Encounter: Payer: PPO | Attending: Surgery | Admitting: Surgery

## 2017-02-10 DIAGNOSIS — E1122 Type 2 diabetes mellitus with diabetic chronic kidney disease: Secondary | ICD-10-CM | POA: Insufficient documentation

## 2017-02-10 DIAGNOSIS — Z794 Long term (current) use of insulin: Secondary | ICD-10-CM | POA: Diagnosis not present

## 2017-02-10 DIAGNOSIS — M1990 Unspecified osteoarthritis, unspecified site: Secondary | ICD-10-CM | POA: Insufficient documentation

## 2017-02-10 DIAGNOSIS — I129 Hypertensive chronic kidney disease with stage 1 through stage 4 chronic kidney disease, or unspecified chronic kidney disease: Secondary | ICD-10-CM | POA: Insufficient documentation

## 2017-02-10 DIAGNOSIS — L97529 Non-pressure chronic ulcer of other part of left foot with unspecified severity: Secondary | ICD-10-CM | POA: Diagnosis not present

## 2017-02-10 DIAGNOSIS — Z7901 Long term (current) use of anticoagulants: Secondary | ICD-10-CM | POA: Diagnosis not present

## 2017-02-10 DIAGNOSIS — L97528 Non-pressure chronic ulcer of other part of left foot with other specified severity: Secondary | ICD-10-CM | POA: Insufficient documentation

## 2017-02-10 DIAGNOSIS — Z87891 Personal history of nicotine dependence: Secondary | ICD-10-CM | POA: Diagnosis not present

## 2017-02-10 DIAGNOSIS — I4891 Unspecified atrial fibrillation: Secondary | ICD-10-CM | POA: Diagnosis not present

## 2017-02-10 DIAGNOSIS — I2510 Atherosclerotic heart disease of native coronary artery without angina pectoris: Secondary | ICD-10-CM | POA: Diagnosis not present

## 2017-02-10 DIAGNOSIS — E1151 Type 2 diabetes mellitus with diabetic peripheral angiopathy without gangrene: Secondary | ICD-10-CM | POA: Diagnosis not present

## 2017-02-10 DIAGNOSIS — N183 Chronic kidney disease, stage 3 (moderate): Secondary | ICD-10-CM | POA: Diagnosis not present

## 2017-02-10 DIAGNOSIS — Z89412 Acquired absence of left great toe: Secondary | ICD-10-CM | POA: Diagnosis not present

## 2017-02-10 DIAGNOSIS — L97423 Non-pressure chronic ulcer of left heel and midfoot with necrosis of muscle: Secondary | ICD-10-CM | POA: Diagnosis not present

## 2017-02-10 DIAGNOSIS — L97422 Non-pressure chronic ulcer of left heel and midfoot with fat layer exposed: Secondary | ICD-10-CM | POA: Diagnosis not present

## 2017-02-10 DIAGNOSIS — I87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: Secondary | ICD-10-CM | POA: Insufficient documentation

## 2017-02-10 DIAGNOSIS — L03116 Cellulitis of left lower limb: Secondary | ICD-10-CM | POA: Diagnosis not present

## 2017-02-10 DIAGNOSIS — E11610 Type 2 diabetes mellitus with diabetic neuropathic arthropathy: Secondary | ICD-10-CM | POA: Insufficient documentation

## 2017-02-10 DIAGNOSIS — D649 Anemia, unspecified: Secondary | ICD-10-CM | POA: Insufficient documentation

## 2017-02-10 DIAGNOSIS — H3530 Unspecified macular degeneration: Secondary | ICD-10-CM | POA: Diagnosis not present

## 2017-02-10 DIAGNOSIS — E1140 Type 2 diabetes mellitus with diabetic neuropathy, unspecified: Secondary | ICD-10-CM | POA: Diagnosis not present

## 2017-02-10 DIAGNOSIS — Z89511 Acquired absence of right leg below knee: Secondary | ICD-10-CM | POA: Diagnosis not present

## 2017-02-10 DIAGNOSIS — I252 Old myocardial infarction: Secondary | ICD-10-CM | POA: Diagnosis not present

## 2017-02-10 DIAGNOSIS — E785 Hyperlipidemia, unspecified: Secondary | ICD-10-CM | POA: Diagnosis not present

## 2017-02-10 DIAGNOSIS — I872 Venous insufficiency (chronic) (peripheral): Secondary | ICD-10-CM | POA: Diagnosis not present

## 2017-02-11 NOTE — Progress Notes (Signed)
[name] ([id number]) Visit Report for 02/10/2017 Chief Complaint Document Details Patient Name: [name] Date of Service: 02/10/2017 3:45 PM Medical Record Number: [id number] Patient Account Number: [id number] Date of Birth/Sex: 05/27/1941 (76 y.o. Female) Treating RN: Montey Hora Primary Care Provider: Leighton Ruff Other Clinician: Referring Provider: Leighton Ruff Treating Provider/Extender: Frann Rider in Treatment: 1 Information Obtained from: Patient Chief Complaint Patient is here for review of a wound on the left heel Electronic Signature(s) Signed: 02/10/2017 4:21:32 PM By: Christin Fudge MD, FACS Entered By: Christin Fudge on 02/10/2017 16:21:32 [name] ([id number]) -------------------------------------------------------------------------------- HPI Details Patient Name: [name] Date of Service: 02/10/2017 3:45 PM Medical Record Number: [id number] Patient Account Number: [id number] Date of Birth/Sex: Sep 08, 1940 (76 y.o. Female) Treating RN: Montey Hora Primary Care Provider: Leighton Ruff Other Clinician: Referring Provider: Leighton Ruff Treating Provider/Extender: Frann Rider in Treatment: 1 History of Present Illness HPI Description: 08/21/16; [name] is a 76 year old woman who I cared for in our [facility] clinic up until January 2016. At that point she had a wound in her left great toe. 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 He follows with vein and vascular and has a follow-up appointment in early February. She is status left femoral to anterior tibial bypass in February 2016. She is also noted to have venous stasis changes and a history of ulcers. 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 fib on Eliquis. She has bladder cancer and has a chronic urostomy. The patient tells me that 2 weeks ago she had a right prosthesis and a new left diabetic shoe. Roughly one week ago she noted a wound on her left lateral heel. She zinc oxide and some silver alginate she had left over until she could be seen here. It was also noted that she has had blistering on her posterior left calf and an open area here as well. Her records note that she has chronic venous insufficiency with chronic inflammation especially in the left mid calf area. The patient has not been systemically unwell no fever or chills. She is reasonably insensate and has not felt any pain 09/04/16; the patient has a fairly substantial wound on the left lateral calcaneus. We have been using Santyl to this area. Blister on the left lower leg appears to of healed the patient also follows with Dr. Sherren Mocha early of vein and vascular. He felt that she should have enough blood flow to heal her left heel wound. He renewed her doxycycline for a further week. She is status post left femoral to anterior tibial bypass in February 2016. 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 She has a remote history of a right BKA. The XRAY that I ordered last week showed no definite evidence of osteomyelitis however a small focus of demineralization was noted in the plantar aspect of the calcaneus which might warrant an MRI possibly indicating early osteomyelitis however this is not in the area of her wound and I'm going to keep this under advisement for now. 09/11/16; still a substantial wound in the left lateral calcaneus we've been using Santyl 09/18/16; still a substantial wound on the left lateral calcaneus. We  are still using Santyl 09/24/16; still a substantial wound on the left lateral calcaneus. We're using Santyl change one or 2 times a week and mechanical  debridement which he comes to [place]. 10/02/16; left lateral calcaneus using Santyl changing 3 times a week. Run Apligraf through her insurance 10/09/16; still using Santyl without much change. Being changed 2 times a week by the patient and once here. We did not run Apligraf last week we'll do that this week 10/16/16; the area on her left lateral heel; started Iodoflex last week. Apligraf set a co-pay of $250 per for Schone, [name] B. ([id number]) application 1/88/41; left lateral heel. on iodoflex. approved for appligraft, start next week 10/30/16 Apligraf #1 today 11/13/16- patient is here for follow up evaluation of her left lateral calcaneus ulcer. She had Apligraf #1 placed 2 weeks ago. She is tolerating compression. She is voicing no complaints or concerns. She did return last week for nurse visit. 11/27/16; dramatic improvement in the overall appearance of this wound. What looks to be a tinea infection around this. Apligraf #3 12/11/16; only a small healthy granulated oval wound remains on the lateral aspect of her calcaneus. I think this is probably too small to consider an Apligraf that this point although I gave this some thought. 12/18/16; wound is smaller but still not closed. We'll use Hydrofera Blue last week. Switch to Promogran this week 12/25/16 Wound is totally epithelialized. No surrounding erythema. This was originally a pressure injury at the time of new footwear on her righ prosthesis She has PAD for which she follows with Dr. Donnetta Hutching 01/28/17 READMISSION this is a patient with type 2 diabetes and complications including PAD, neuropathy. Prior history of foot wounds with an amputation of the left great toe and right BKA. We had her in clinic up until a month ago with a very difficult wound on the lateral left heel. She was eventually healed out after a prolonged course of Apligraf. She did well apparently recently she states she was washing her leg and bathing her leg and noted some skin  come off her foot. She is also recently noted blisters on her left calf. She states she does not have compression stockings which is at odds with my last note when we discharged her on 12/25/16. In any case she is here for review in view of multiple concerns on the left leg and foot. She has not been using her foot wear using only the healing sandal that we gave her. She follows with Dr. early for her PAD and tells me/reminds me that the last time she saw him he told her that her vascular supply with quite good I'll need to review this. 6/66/18; patient's wounds looks somewhat better today. Culture I did last week on the left second toe was negative she is completed antibiotics. She had a sanguinous blister on her left third toe which I debridement with a curette today so we can probably dresser wound. The area on the left lateral heel looks as though it's progressing towards closure or edema control is better and the blisters on her anterior leg are also improved. We have been using silver alginate all her wounds 02/10/2017 -- she is here to see me today because of the upcoming holiday and has been doing well overall. Electronic Signature(s) Signed: 02/10/2017 4:22:15 PM By: Christin Fudge MD, FACS Entered By: Christin Fudge on 02/10/2017 16:22:15 Stumpp, [name] ([id number]) -------------------------------------------------------------------------------- Physical Exam Details Patient Name: [name] Date of Service: 02/10/2017  3:45 PM Medical Record Number: [id number] Patient Account Number: [id number] Date of Birth/Sex: 04-Dec-1940 (76 y.o. Female) Treating RN: Montey Hora Primary Care Provider: Leighton Ruff Other Clinician: Referring Provider: Leighton Ruff Treating Provider/Extender: Frann Rider in Treatment: 1 Constitutional . Pulse regular. Respirations normal and unlabored. Afebrile. . Eyes Nonicteric. Reactive to light. Ears, Nose, Mouth, and  Throat Lips, teeth, and gums WNL.Marland Kitchen Moist mucosa without lesions. Neck supple and nontender. No palpable supraclavicular or cervical adenopathy. Normal sized without goiter. Respiratory WNL. No retractions.. Cardiovascular Pedal Pulses WNL. No clubbing, cyanosis or edema. Chest Breasts symmetical and no nipple discharge.. Breast tissue WNL, no masses, lumps, or tenderness.. Lymphatic No adneopathy. No adenopathy. No adenopathy. Musculoskeletal Adexa without tenderness or enlargement.. Digits and nails w/o clubbing, cyanosis, infection, petechiae, ischemia, or inflammatory conditions.. Integumentary (Hair, Skin) No suspicious lesions. No crepitus or fluctuance. No peri-wound warmth or erythema. No masses.Marland Kitchen Psychiatric Judgement and insight Intact.. No evidence of depression, anxiety, or agitation.. Notes the wound on the left lower extremity on her anterior shin continues to have some blisters which are a bit better from what the nurse tells me but still needs silver alginate and a compression wrap. The areas on her toe are now dry and the plantar aspect of her third toe is also dry. The left lateral foot continues to have a very tiny ulceration which will benefit from some silver alginate. Electronic Signature(s) Signed: 02/10/2017 4:23:14 PM By: Christin Fudge MD, FACS Entered By: Christin Fudge on 02/10/2017 16:23:13 [name] ([id number]) -------------------------------------------------------------------------------- Physician Orders Details Patient Name: [name] Date of Service: 02/10/2017 3:45 PM Medical Record Number: [id number] Patient Account Number: [id number] Date of Birth/Sex: April 09, 1941 (76 y.o. Female) Treating RN: Montey Hora Primary Care Provider: Leighton Ruff Other Clinician: Referring Provider: Leighton Ruff Treating Provider/Extender: Frann Rider in Treatment: 1 Verbal / Phone Orders: No Diagnosis Coding Wound Cleansing Wound  #3 Left Calcaneus o Clean wound with Normal Saline. o Cleanse wound with mild soap and water Wound #4 Left Toe Second o Clean wound with Normal Saline. o Cleanse wound with mild soap and water Anesthetic Wound #3 Left Calcaneus o Topical Lidocaine 4% cream applied to wound bed prior to debridement Wound #4 Left Toe Second o Topical Lidocaine 4% cream applied to wound bed prior to debridement Primary Wound Dressing Wound #3 Left Calcaneus o Aquacel Ag Wound #4 Left Toe Second o Foam Secondary Dressing Wound #3 Left Calcaneus o Kerlix and Coban Wound #4 Left Toe Second o Kerlix and Coban o Foam Follow-up Appointments Wound #3 Left Calcaneus o Return Appointment in 1 week. Wound #4 Left Toe Second [name], [name] B. ([id number]) o Return Appointment in 1 week. Edema Control Wound #3 Left Calcaneus o Elevate legs to the level of the heart and pump ankles as often as possible Wound #4 Left Toe Second o Elevate legs to the level of the heart and pump ankles as often as possible Electronic Signature(s) Signed: 02/10/2017 4:25:24 PM By: Christin Fudge MD, FACS Signed: 02/10/2017 4:56:49 PM By: Montey Hora Entered By: Montey Hora on 02/10/2017 16:10:26 Grossberg, [name] ([id number]) -------------------------------------------------------------------------------- Problem List Details Patient Name: [name] Date of Service: 02/10/2017 3:45 PM Medical Record Number: [id number] Patient Account Number: [id number] Date of Birth/Sex: 1941/02/19 (76 y.o. 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 L97.528 Non-pressure chronic ulcer of other part of left foot with 01/28/2017 Yes other specified severity I87.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 hypertension (idiopathic) with 01/28/2017 Yes inflammation of left lower extremity L03.116 Cellulitis of left lower limb 01/28/2017 Yes Inactive Problems Resolved Problems Electronic Signature(s) Signed: 02/10/2017 4:21:17 PM By: Christin Fudge MD, FACS Seppala, [name] ([id number]) Entered By: Christin Fudge on 02/10/2017 16:21:16 Odell, [name] ([id number]) -------------------------------------------------------------------------------- Progress Note Details Patient Name: [name] Date of Service: 02/10/2017 3:45 PM Medical Record Number: [id number] Patient Account Number: [id number] Date of Birth/Sex: June 23, 1941 (76 y.o. Female) Treating RN: Montey Hora Primary Care Provider: Leighton Ruff Other Clinician: Referring Provider: Leighton Ruff Treating Provider/Extender: Frann Rider in Treatment: 1 Subjective Chief Complaint Information obtained from Patient Patient is here for review of a wound on the left heel History of Present Illness (HPI) 08/21/16; Mrs. Halbur is a 76 year old woman who I cared for in our [facility] clinic up until January 2016. At that point she had a wound in her left great toe. 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 He follows with vein and vascular and has a follow-up appointment in early February. She is status left femoral to anterior tibial bypass in February 2016. She is also noted to have venous  stasis changes and a history of ulcers. 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 fib on Eliquis. She has bladder cancer and has a chronic urostomy. The patient tells me that 2 weeks ago she had a right prosthesis and a new left diabetic shoe. Roughly one week ago she noted a wound on her left lateral heel. She zinc oxide and some silver alginate she had left over until she could be seen here. It was also noted that she has had blistering on her posterior left calf and an open area here as well. Her records note that she has chronic venous insufficiency with chronic inflammation especially in the left mid calf area. The patient has not been systemically unwell no fever or chills. She is reasonably insensate and has not felt any pain 09/04/16; the patient has a fairly substantial wound on the left lateral calcaneus. We have been using Santyl to this area. Blister on the left lower leg appears to of healed the patient also follows with Dr. Sherren Mocha early of vein and vascular. He felt that she should have enough blood flow to heal her left heel wound. He renewed her doxycycline for a further week. She is status post left femoral to anterior tibial bypass in February 2016. 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 She has a remote history of a right BKA. The XRAY that I ordered last week showed no definite evidence of osteomyelitis however a small focus of demineralization was noted in the plantar aspect of the calcaneus which might warrant an MRI possibly indicating early osteomyelitis however this is not in the area of her wound and I'm going to  keep this under advisement for now. 09/11/16; still a substantial wound in the left lateral calcaneus we've been using Santyl 09/18/16; still a substantial wound on the left lateral calcaneus. We are still using  Santyl 09/24/16; still a substantial wound on the left lateral calcaneus. We're using Santyl change one or 2 times a Gann, [name] B. ([id number]) week and mechanical debridement which he comes to [place]. 10/02/16; left lateral calcaneus using Santyl changing 3 times a week. Run Apligraf through her insurance 10/09/16; still using Santyl without much change. Being changed 2 times a week by the patient and once here. We did not run Apligraf last week we'll do that this week 10/16/16; the area on her left lateral heel; started Iodoflex last week. Apligraf set a co-pay of $182 per for application 9/93/71; left lateral heel. on iodoflex. approved for appligraft, start next week 10/30/16 Apligraf #1 today 11/13/16- patient is here for follow up evaluation of her left lateral calcaneus ulcer. She had Apligraf #1 placed 2 weeks ago. She is tolerating compression. She is voicing no complaints or concerns. She did return last week for nurse visit. 11/27/16; dramatic improvement in the overall appearance of this wound. What looks to be a tinea infection around this. Apligraf #3 12/11/16; only a small healthy granulated oval wound remains on the lateral aspect of her calcaneus. I think this is probably too small to consider an Apligraf that this point although I gave this some thought. 12/18/16; wound is smaller but still not closed. We'll use Hydrofera Blue last week. Switch to Promogran this week 12/25/16 Wound is totally epithelialized. No surrounding erythema. This was originally a pressure injury at the time of new footwear on her righ prosthesis She has PAD for which she follows with Dr. Donnetta Hutching 01/28/17 READMISSION this is a patient with type 2 diabetes and complications including PAD, neuropathy. Prior history of foot wounds with an amputation of the left great toe and right BKA. We had her in clinic up until a month ago with a very difficult wound on the lateral left heel. She was eventually healed out after a  prolonged course of Apligraf. She did well apparently recently she states she was washing her leg and bathing her leg and noted some skin come off her foot. She is also recently noted blisters on her left calf. She states she does not have compression stockings which is at odds with my last note when we discharged her on 12/25/16. In any case she is here for review in view of multiple concerns on the left leg and foot. She has not been using her foot wear using only the healing sandal that we gave her. She follows with Dr. early for her PAD and tells me/reminds me that the last time she saw him he told her that her vascular supply with quite good I'll need to review this. 6/66/18; patient's wounds looks somewhat better today. Culture I did last week on the left second toe was negative she is completed antibiotics. She had a sanguinous blister on her left third toe which I debridement with a curette today so we can probably dresser wound. The area on the left lateral heel looks as though it's progressing towards closure or edema control is better and the blisters on her anterior leg are also improved. We have been using silver alginate all her wounds 02/10/2017 -- she is here to see me today because of the upcoming holiday and has been doing well overall. [name], [name] B. ([id number])  Objective Constitutional Pulse regular. Respirations normal and unlabored. Afebrile. Vitals Time Taken: 3:42 PM, Height: 63 in, Weight: 200 lbs, BMI: 35.4, Temperature: 97.9 F, Pulse: 53 bpm, Respiratory Rate: 18 breaths/min, Blood Pressure: 129/42 mmHg. Eyes Nonicteric. Reactive to light. Ears, Nose, Mouth, and Throat Lips, teeth, and gums WNL.Marland Kitchen Moist mucosa without lesions. Neck supple and nontender. No palpable supraclavicular or cervical adenopathy. Normal sized without goiter. Respiratory WNL. No retractions.. Cardiovascular Pedal Pulses WNL. No clubbing, cyanosis or edema. Chest Breasts symmetical  and no nipple discharge.. Breast tissue WNL, no masses, lumps, or tenderness.. Lymphatic No adneopathy. No adenopathy. No adenopathy. Musculoskeletal Adexa without tenderness or enlargement.. Digits and nails w/o clubbing, cyanosis, infection, petechiae, ischemia, or inflammatory conditions.Marland Kitchen Psychiatric Judgement and insight Intact.. No evidence of depression, anxiety, or agitation.. General Notes: the wound on the left lower extremity on her anterior shin continues to have some blisters which are a bit better from what the nurse tells me but still needs silver alginate and a compression wrap. The areas on her toe are now dry and the plantar aspect of her third toe is also dry. The left lateral foot continues to have a very tiny ulceration which will benefit from some silver alginate. Integumentary (Hair, Skin) No suspicious lesions. No crepitus or fluctuance. No peri-wound warmth or erythema. No masses.. Wound #3 status is Open. Original cause of wound was Gradually Appeared. The wound is located on the Left Calcaneus. The wound measures 0.1cm length x 0.1cm width x 0.1cm depth; 0.008cm^2 area and 0.001cm^3 volume. There is no tunneling or undermining noted. There is a large amount of serous drainage Mccullar, [name] B. ([id number]) noted. The wound margin is flat and intact. There is large (67-100%) pink granulation within the wound bed. There is a small (1-33%) amount of necrotic tissue within the wound bed including Adherent Slough. The periwound skin appearance did not exhibit: Callus, Crepitus, Excoriation, Induration, Rash, Scarring, Dry/Scaly, Maceration, Atrophie Blanche, Cyanosis, Ecchymosis, Hemosiderin Staining, Mottled, Pallor, Rubor, Erythema. Periwound temperature was noted as No Abnormality. Wound #4 status is Open. Original cause of wound was Gradually Appeared. The wound is located on the Left Toe Second. The wound measures 0.9cm length x 1.6cm width x 0.1cm depth; 1.131cm^2  area and 0.113cm^3 volume. There is no tunneling or undermining noted. There is a large amount of serous drainage noted. The wound margin is flat and intact. There is no granulation within the wound bed. There is a large (67-100%) amount of necrotic tissue within the wound bed including Eschar. The periwound skin appearance did not exhibit: Callus, Crepitus, Excoriation, Induration, Rash, Scarring, Dry/Scaly, Maceration, Atrophie Blanche, Cyanosis, Ecchymosis, Hemosiderin Staining, Mottled, Pallor, Rubor, Erythema. Periwound temperature was noted as No Abnormality. Wound #5 status is Healed - Epithelialized. Original cause of wound was Gradually Appeared. The wound is located on the Left Toe Third. The wound measures 0cm length x 0cm width x 0cm depth; 0cm^2 area and 0cm^3 volume. 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 Cleanse  wound with mild soap and water Wound #4 Left Toe Second: Clean wound with Normal Saline. Cleanse wound with mild soap and water Anesthetic: [name], [name] B. ([id number]) Wound #3 Left Calcaneus: Topical Lidocaine 4% cream applied to wound bed prior to debridement Wound #4 Left Toe Second: Topical Lidocaine 4% cream applied to wound bed prior to debridement Primary Wound Dressing: Wound #3 Left Calcaneus: Aquacel Ag Wound #4 Left Toe  Second: Foam Secondary Dressing: Wound #3 Left Calcaneus: Kerlix and Coban Wound #4 Left Toe Second: Kerlix and Coban Foam Follow-up Appointments: Wound #3 Left Calcaneus: Return Appointment in 1 week. Wound #4 Left Toe Second: Return Appointment in 1 week. Edema Control: Wound #3 Left Calcaneus: Elevate legs to the level of the heart and pump ankles as often as possible Wound #4 Left Toe Second: Elevate legs to the level of the heart and pump ankles as often as possible after review today I have recommended: 1. Silver alginate and a light Kerlix and Coban dressing to her left lower extremity. 2. Silver alginate on her left lateral heel 3. Foam padding over her toes where there are no open wounds at the present time 4. Offloading has been discussed with her in great detail 5. She has brought was Juzo stockings along with her and I would leave that decision to Dr. Dellia Nims when he sees her next Electronic Signature(s) Signed: 02/10/2017 4:24:37 PM By: Christin Fudge MD, FACS Entered By: Christin Fudge on 02/10/2017 16:24:36 Lafoy, [name] ([id number]) -------------------------------------------------------------------------------- [place] Details Patient Name: [name] Date of Service: 02/10/2017 Medical Record Number: [id number] Patient Account Number: [id number] Date of Birth/Sex: [birth year] (76 y.o. 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 L97.528 Non-pressure  chronic ulcer of other part of left foot with other specified severity I87.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 L03.116 Cellulitis of left lower limb Facility Procedures CPT4 Code: 69678938 Description: 99214 - WOUND CARE VISIT-LEV 4 EST PT Modifier: Quantity: 1 Physician Procedures CPT4: Description Modifier Quantity Code 1017510 99213 - WC PHYS LEVEL 3 - EST PT 1 ICD-10 Description Diagnosis 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

## 2017-02-11 NOTE — Progress Notes (Signed)
[name] ([id number]) Visit Report for 02/10/2017 Arrival Information Details Patient Name: [name], [name] Date of Service: 02/10/2017 3:45 PM Medical Record Number: [id number] Patient Account Number: [id number] Date of Birth/Sex: 26-Jul-1941 (76 y.o. Female) Treating RN: Montey Hora Primary Care Abdo Denault: Leighton Ruff Other Clinician: Referring Amelia Burgard: Leighton Ruff Treating Naydene Kamrowski/Extender: Frann Rider in Treatment: 1 Visit Information History Since Last Visit Added or deleted any medications: No Patient Arrived: Wheel Chair Any new allergies or adverse reactions: No Arrival Time: 15:39 Had a fall or experienced change in No activities of daily living that may affect Accompanied By: friend risk of falls: Transfer Assistance: None Signs or symptoms of abuse/neglect since last No Patient Identification Verified: Yes visito Secondary Verification Process Yes Hospitalized since last visit: No Completed: Has Dressing in Place as Prescribed: Yes Patient Has Alerts: Yes Has Compression in Place as Prescribed: Yes Patient Alerts: ABI Pain Present Now: No [address] [address]>220 Electronic Signature(s) Signed: 02/10/2017 4:56:49 PM By: Montey Hora Entered By: Montey Hora on 02/10/2017 15:42:05 Madl, [name] ([id number]) -------------------------------------------------------------------------------- Clinic Level of Care Assessment Details Patient Name: [name] Date of Service: 02/10/2017 3:45 PM Medical Record Number: [id number] Patient Account Number: [id number] Date of Birth/Sex: 15-Sep-1940 (76 y.o. Female) Treating RN: Montey Hora Primary Care Gabbriella Presswood: Leighton Ruff Other Clinician: Referring Mccartney Chuba: Leighton Ruff Treating Simon Aaberg/Extender: Frann Rider in Treatment: 1 Clinic Level of Care Assessment Items TOOL 4 Quantity Score []  - Use when only an EandM is performed on FOLLOW-UP visit 0 ASSESSMENTS - Nursing Assessment  / Reassessment X - Reassessment of Co-morbidities (includes updates in patient status) 1 10 X - Reassessment of Adherence to Treatment Plan 1 5 ASSESSMENTS - Wound and Skin Assessment / Reassessment []  - Simple Wound Assessment / Reassessment - one wound 0 X - Complex Wound Assessment / Reassessment - multiple wounds 3 5 []  - Dermatologic / Skin Assessment (not related to wound area) 0 ASSESSMENTS - Focused Assessment []  - Circumferential Edema Measurements - multi extremities 0 []  - Nutritional Assessment / Counseling / Intervention 0 X - Lower Extremity Assessment (monofilament, tuning fork, pulses) 1 5 []  - Peripheral Arterial Disease Assessment (using hand held doppler) 0 ASSESSMENTS - Ostomy and/or Continence Assessment and Care []  - Incontinence Assessment and Management 0 []  - Ostomy Care Assessment and Management (repouching, etc.) 0 PROCESS - Coordination of Care X - Simple Patient / Family Education for ongoing care 1 15 []  - Complex (extensive) Patient / Family Education for ongoing care 0 []  - Staff obtains Programmer, systems, Records, Test Results / Process Orders 0 []  - Staff telephones HHA, Nursing Homes / Clarify orders / etc 0 []  - Routine Transfer to another Facility (non-emergent condition) 0 Memoli, Talah B. ([id number]) []  - Routine Hospital Admission (non-emergent condition) 0 []  - New Admissions / Biomedical engineer / Ordering NPWT, Apligraf, etc. 0 []  - Emergency Hospital Admission (emergent condition) 0 X - Simple Discharge Coordination 1 10 []  - Complex (extensive) Discharge Coordination 0 PROCESS - Special Needs []  - Pediatric / Minor Patient Management 0 []  - Isolation Patient Management 0 []  - Hearing / Language / Visual special needs 0 []  - Assessment of Community assistance (transportation, D/C planning, etc.) 0 []  - Additional assistance / Altered mentation 0 []  - Support Surface(s) Assessment (bed, cushion, seat, etc.) 0 INTERVENTIONS - Wound Cleansing  / Measurement []  - Simple Wound Cleansing - one wound 0 X - Complex Wound Cleansing - multiple wounds 3 5 X - Wound Imaging (photographs - any  number of wounds) 1 5 []  - Wound Tracing (instead of photographs) 0 []  - Simple Wound Measurement - one wound 0 X - Complex Wound Measurement - multiple wounds 3 5 INTERVENTIONS - Wound Dressings X - Small Wound Dressing one or multiple wounds 1 10 []  - Medium Wound Dressing one or multiple wounds 0 X - Large Wound Dressing one or multiple wounds 1 20 []  - Application of Medications - topical 0 []  - Application of Medications - injection 0 INTERVENTIONS - Miscellaneous []  - External ear exam 0 Kitamura, Odis B. ([id number]) []  - Specimen Collection (cultures, biopsies, blood, body fluids, etc.) 0 []  - Specimen(s) / Culture(s) sent or taken to Lab for analysis 0 []  - Patient Transfer (multiple staff / Harrel Lemon Lift / Similar devices) 0 []  - Simple Staple / Suture removal (25 or less) 0 []  - Complex Staple / Suture removal (26 or more) 0 []  - Hypo / Hyperglycemic Management (close monitor of Blood Glucose) 0 []  - Ankle / Brachial Index (ABI) - do not check if billed separately 0 X - Vital Signs 1 5 Has the patient been seen at the hospital within the last three years: Yes Total Score: 130 Level Of Care: New/Established - Level 4 Electronic Signature(s) Signed: 02/10/2017 4:56:49 PM By: Montey Hora Entered By: Montey Hora on 02/10/2017 16:31:20 Lave, [name] ([id number]) -------------------------------------------------------------------------------- Encounter Discharge Information Details Patient Name: [name] Date of Service: 02/10/2017 3:45 PM Medical Record Number: [id number] Patient Account Number: [id number] Date of Birth/Sex: 1940/11/04 (76 y.o. Female) Treating RN: Montey Hora Primary Care Barnett Elzey: Leighton Ruff Other Clinician: Referring Denilson Salminen: Leighton Ruff Treating Raeshaun Simson/Extender: Frann Rider in Treatment: 1 Encounter Discharge Information Items Discharge Pain Level: 0 Discharge Condition: Stable Ambulatory Status: Wheelchair Discharge Destination: Home Transportation: Private Auto Accompanied By: friend Schedule Follow-up Appointment: Yes Medication Reconciliation completed No and provided to Patient/Care Alexianna Nachreiner: Provided on Clinical Summary of Care: 02/10/2017 Form Type Recipient Paper Patient BI Electronic Signature(s) Signed: 02/10/2017 4:25:30 PM By: Ruthine Dose Entered By: Ruthine Dose on 02/10/2017 16:25:29 Armand, [name] ([id number]) -------------------------------------------------------------------------------- Lower Extremity Assessment Details Patient Name: [name] Date of Service: 02/10/2017 3:45 PM Medical Record Number: [id number] Patient Account Number: [id number] Date of Birth/Sex: 29-Dec-1940 (76 y.o. Female) Treating RN: Montey Hora Primary Care Hayde Kilgour: Leighton Ruff Other Clinician: Referring Zyana Amaro: Leighton Ruff Treating Huel Centola/Extender: Frann Rider in Treatment: 1 Vascular Assessment Pulses: Dorsalis Pedis Palpable: [Right:Yes] Posterior Tibial Extremity colors, hair growth, and conditions: Extremity Color: [Right:Normal] Hair Growth on Extremity: [Right:No] Temperature of Extremity: [Right:Warm] Capillary Refill: [Right:< 3 seconds] Electronic Signature(s) Signed: 02/10/2017 4:56:49 PM By: Montey Hora Entered By: Montey Hora on 02/10/2017 15:58:18 Starke, [name] ([id number]) -------------------------------------------------------------------------------- Multi Wound Chart Details Patient Name: [name] Date of Service: 02/10/2017 3:45 PM Medical Record Number: [id number] Patient Account Number: [id number] Date of Birth/Sex: September 11, 1940 (76 y.o. Female) Treating RN: Montey Hora Primary Care Emilo Gras: Leighton Ruff Other Clinician: Referring Gabrial Poppell: Leighton Ruff Treating Garin Mata/Extender: Frann Rider in Treatment: 1 Vital Signs Height(in): 63 Pulse(bpm): 53 Weight(lbs): 200 Blood Pressure 129/42 (mmHg): Body Mass Index(BMI): 35 Temperature(F): 97.9 Respiratory Rate 18 (breaths/min): Photos: [3:No Photos] [4:No Photos] [5:No Photos] Wound Location: [3:Left Calcaneus] [4:Left Toe Second] [5:Left Toe Third] Wounding Event: [3:Gradually Appeared] [4:Gradually Appeared] [5:Gradually Appeared] Primary Etiology: [3:Lymphedema] [4:Lymphedema] [5:Lymphedema] Comorbid History: [3:Anemia, Coronary Artery Anemia, Coronary Artery N/A Disease, Hypertension, Myocardial Infarction, Type II Diabetes, Osteoarthritis, Neuropathy Osteoarthritis, Neuropathy] [4:Disease, Hypertension, Myocardial Infarction, Type II  Diabetes,] Date Acquired: [  3:01/21/2017] [4:01/21/2017] [5:01/21/2017] Weeks of Treatment: [3:1] [4:1] [5:1] Wound Status: [3:Open] [4:Open] [5:Healed - Epithelialized] Measurements L x W x D 0.1x0.1x0.1 [4:0.9x1.6x0.1] [5:0x0x0] (cm) Area (cm) : [3:0.008] [4:1.131] [5:0] Volume (cm) : [3:0.001] [4:0.113] [5:0] % Reduction in Area: [3:97.90%] [4:54.30%] [5:100.00%] % Reduction in Volume: 97.40% [4:54.30%] [5:100.00%] Classification: [3:Full Thickness Without Exposed Support Structures] [4:Partial Thickness] [5:Partial Thickness] HBO Classification: [3:Grade 1] [4:Grade 1] [5:N/A] Exudate Amount: [3:Large] [4:Large] [5:N/A] Exudate Type: [3:Serous] [4:Serous] [5:N/A] Exudate Color: [3:amber] [4:amber] [5:N/A] Wound Margin: [3:Flat and Intact] [4:Flat and Intact] [5:N/A] Granulation Amount: [3:Large (67-100%)] [4:None Present (0%)] [5:N/A] Granulation Quality: [3:Pink] [4:N/A] [5:N/A] Necrotic Amount: [3:Small (1-33%)] [4:Large (67-100%)] [5:N/A] Necrotic Tissue: [3:Adherent Slough] [4:Eschar] [5:N/A] Exposed Structures: Fascia: No Fascia: No N/A Fat Layer (Subcutaneous Fat Layer (Subcutaneous Tissue) Exposed: No Tissue)  Exposed: No Tendon: No Tendon: No Muscle: No Muscle: No Joint: No Joint: No Bone: No Bone: No Epithelialization: Small (1-33%) None N/A Periwound Skin Texture: Excoriation: No Excoriation: No No Abnormalities Noted Induration: No Induration: No Callus: No Callus: No Crepitus: No Crepitus: No Rash: No Rash: No Scarring: No Scarring: No Periwound Skin Maceration: No Maceration: No No Abnormalities Noted Moisture: Dry/Scaly: No Dry/Scaly: No Periwound Skin Color: Atrophie Blanche: No Atrophie Blanche: No No Abnormalities Noted Cyanosis: No Cyanosis: No Ecchymosis: No Ecchymosis: No Erythema: No Erythema: No Hemosiderin Staining: No Hemosiderin Staining: No Mottled: No Mottled: No Pallor: No Pallor: No Rubor: No Rubor: No Temperature: No Abnormality No Abnormality N/A Tenderness on No No No Palpation: Wound Preparation: Ulcer Cleansing: Ulcer Cleansing: N/A Rinsed/Irrigated with Rinsed/Irrigated with Saline Saline Topical Anesthetic Topical Anesthetic Applied: Other: lidocaine Applied: Other: lidocaine 4% 4% Treatment Notes Electronic Signature(s) Signed: 02/10/2017 4:21:25 PM By: Christin Fudge MD, FACS Entered By: Christin Fudge on 02/10/2017 16:21:24 SHERIDEN, ARCHIBEQUE ([id number]) -------------------------------------------------------------------------------- [place] Details Patient Name: [name] Date of Service: 02/10/2017 3:45 PM Medical Record Number: [id number] Patient Account Number: [id number] Date of Birth/Sex: 06-13-1941 (76 y.o. Female) Treating RN: Montey Hora Primary Care Hayward Rylander: Leighton Ruff Other Clinician: Referring Karsin Pesta: Leighton Ruff Treating Yaroslav Gombos/Extender: Frann Rider in Treatment: 1 Active Inactive ` Orientation to the Wound Care Program Nursing Diagnoses: Knowledge deficit related to the wound healing center program Goals: Patient/caregiver will verbalize understanding of  the [facility] Date Initiated: 02/03/2017 Target Resolution Date: 03/05/2017 Goal Status: Active Interventions: Provide education on orientation to the wound center Notes: ` Wound/Skin Impairment Nursing Diagnoses: Knowledge deficit related to ulceration/compromised skin integrity Goals: Ulcer/skin breakdown will heal within 14 weeks Date Initiated: 02/03/2017 Target Resolution Date: 05/09/2017 Goal Status: Active Interventions: Assess patient/caregiver ability to perform ulcer/skin care regimen upon admission and as needed Assess ulceration(s) every visit Treatment Activities: Topical wound management initiated : 01/28/2017 Notes: Electronic Signature(s) SEJAL, COFIELD ([id number]) Signed: 02/10/2017 4:56:49 PM By: Montey Hora Entered By: Montey Hora on 02/10/2017 15:58:25 Brandenburger, [name] ([id number]) -------------------------------------------------------------------------------- Pain Assessment Details Patient Name: [name] Date of Service: 02/10/2017 3:45 PM Medical Record Number: [id number] Patient Account Number: [id number] Date of Birth/Sex: 20-Feb-1941 (76 y.o. Female) Treating RN: Montey Hora Primary Care Nao Linz: Leighton Ruff Other Clinician: Referring Azure Barrales: Leighton Ruff Treating Brooklen Runquist/Extender: Frann Rider in Treatment: 1 Active Problems Location of Pain Severity and Description of Pain Patient Has Paino No Site Locations Pain Management and Medication Current Pain Management: Notes Topical or injectable lidocaine is offered to patient for acute pain when surgical debridement is performed. If needed, Patient is instructed to use over the counter pain medication  for the following 24-48 hours after debridement. Wound care MDs do not prescribed pain medications. Patient has chronic pain or uncontrolled pain. Patient has been instructed to make an appointment with their Primary Care Physician for pain  management. Electronic Signature(s) Signed: 02/10/2017 4:56:49 PM By: Montey Hora Entered By: Montey Hora on 02/10/2017 15:42:14 Escudero, [name] ([id number]) -------------------------------------------------------------------------------- Patient/Caregiver Education Details Patient Name: [name] Date of Service: 02/10/2017 3:45 PM Medical Record Number: [id number] Patient Account Number: [id number] Date of Birth/Gender: 03/23/1941 (76 y.o. Female) Treating RN: Montey Hora Primary Care Physician: Leighton Ruff Other Clinician: Referring Physician: Leighton Ruff Treating Physician/Extender: Frann Rider in Treatment: 1 Education Assessment Education Provided To: Patient and Caregiver Education Topics Provided Venous: Handouts: Other: leg elevation Methods: Explain/Verbal Responses: State content correctly Electronic Signature(s) Signed: 02/10/2017 4:56:49 PM By: Montey Hora Entered By: Montey Hora on 02/10/2017 16:08:27 Hickey, [name] ([id number]) -------------------------------------------------------------------------------- Wound Assessment Details Patient Name: [name] Date of Service: 02/10/2017 3:45 PM Medical Record Number: [id number] Patient Account Number: [id number] Date of Birth/Sex: 1940/08/18 (76 y.o. Female) Treating RN: Montey Hora Primary Care Constantina Laseter: Leighton Ruff Other Clinician: Referring Lileigh Fahringer: Leighton Ruff Treating Kenia Teagarden/Extender: Frann Rider in Treatment: 1 Wound Status Wound Number: 3 Primary Lymphedema Etiology: Wound Location: Left Calcaneus Wound Open Wounding Event: Gradually Appeared Status: Date Acquired: 01/21/2017 Comorbid Anemia, Coronary Artery Disease, Weeks Of Treatment: 1 History: Hypertension, Myocardial Infarction, Clustered Wound: No Type II Diabetes, Osteoarthritis, Neuropathy Photos Photo Uploaded By: Montey Hora on 02/10/2017 16:33:28 Wound  Measurements Length: (cm) 0.1 Width: (cm) 0.1 Depth: (cm) 0.1 Area: (cm) 0.008 Volume: (cm) 0.001 % Reduction in Area: 97.9% % Reduction in Volume: 97.4% Epithelialization: Small (1-33%) Tunneling: No Undermining: No Wound Description Full Thickness Without Classification: Exposed Support Structures Diabetic Severity Grade 1 (Wagner): Wound Margin: Flat and Intact Exudate Amount: Large Exudate Type: Serous Exudate Color: amber Foul Odor After Cleansing: No Slough/Fibrino Yes Wound Bed Charrier, Madelina B. ([id number]) Granulation Amount: Large (67-100%) Exposed Structure Granulation Quality: Pink Fascia Exposed: No Necrotic Amount: Small (1-33%) Fat Layer (Subcutaneous Tissue) Exposed: No Necrotic Quality: Adherent Slough Tendon Exposed: No Muscle Exposed: No Joint Exposed: No Bone Exposed: No Periwound Skin Texture Texture Color No Abnormalities Noted: No No Abnormalities Noted: No Callus: No Atrophie Blanche: No Crepitus: No Cyanosis: No Excoriation: No Ecchymosis: No Induration: No Erythema: No Rash: No Hemosiderin Staining: No Scarring: No Mottled: No Pallor: No Moisture Rubor: No No Abnormalities Noted: No Dry / Scaly: No Temperature / Pain Maceration: No Temperature: No Abnormality Wound Preparation Ulcer Cleansing: Rinsed/Irrigated with Saline Topical Anesthetic Applied: Other: lidocaine 4%, Treatment Notes Wound #3 (Left Calcaneus) 1. Cleansed with: Cleanse wound with antibacterial soap and water 2. Anesthetic Topical Lidocaine 4% cream to wound bed prior to debridement 4. Dressing Applied: Aquacel Ag 5. Secondary Dressing Applied ABD Pad 7. Secured with Tape Other (specify in notes) Notes Kerlix and Coban base of toes to just below knee. foam used to protect toes Electronic Signature(s) Signed: 02/10/2017 4:56:49 PM By: Montey Hora Entered By: Montey Hora on 02/10/2017 15:57:17 Fredlund, [name] ([id number]) Surowiec,  [name] ([id number]) -------------------------------------------------------------------------------- Wound Assessment Details Patient Name: [name] Date of Service: 02/10/2017 3:45 PM Medical Record Number: [id number] Patient Account Number: [id number] Date of Birth/Sex: [birth year] (76 y.o. Female) Treating RN: Montey Hora Primary Care Elroy Schembri: Leighton Ruff Other Clinician: Referring Wong Steadham: Leighton Ruff Treating Thomos Domine/Extender: Frann Rider in Treatment: 1 Wound Status Wound Number: 4 Primary Lymphedema Etiology: Wound Location: Left Toe Second  Wound Open Wounding Event: Gradually Appeared Status: Date Acquired: 01/21/2017 Comorbid Anemia, Coronary Artery Disease, Weeks Of Treatment: 1 History: Hypertension, Myocardial Infarction, Clustered Wound: No Type II Diabetes, Osteoarthritis, Neuropathy Photos Photo Uploaded By: Montey Hora on 02/10/2017 16:33:28 Wound Measurements Length: (cm) 0.9 Width: (cm) 1.6 Depth: (cm) 0.1 Area: (cm) 1.131 Volume: (cm) 0.113 % Reduction in Area: 54.3% % Reduction in Volume: 54.3% Epithelialization: None Tunneling: No Undermining: No Wound Description Classification: Partial Thickness Foul Odor A Diabetic Severity (Wagner): Grade 1 Slough/Fibr Wound Margin: Flat and Intact Exudate Amount: Large Exudate Type: Serous Exudate Color: amber fter Cleansing: No ino No Wound Bed Granulation Amount: None Present (0%) Exposed Structure Necrotic Amount: Large (67-100%) Fascia Exposed: No Dusseau, Genelle B. ([id number]) Necrotic Quality: Eschar Fat Layer (Subcutaneous Tissue) Exposed: No Tendon Exposed: No Muscle Exposed: No Joint Exposed: No Bone Exposed: No Periwound Skin Texture Texture Color No Abnormalities Noted: No No Abnormalities Noted: No Callus: No Atrophie Blanche: No Crepitus: No Cyanosis: No Excoriation: No Ecchymosis: No Induration: No Erythema: No Rash: No Hemosiderin  Staining: No Scarring: No Mottled: No Pallor: No Moisture Rubor: No No Abnormalities Noted: No Dry / Scaly: No Temperature / Pain Maceration: No Temperature: No Abnormality Wound Preparation Ulcer Cleansing: Rinsed/Irrigated with Saline Topical Anesthetic Applied: Other: lidocaine 4%, Treatment Notes Wound #4 (Left Toe Second) 1. Cleansed with: Cleanse wound with antibacterial soap and water 2. Anesthetic Topical Lidocaine 4% cream to wound bed prior to debridement 4. Dressing Applied: Foam 7. Secured with Tape Other (specify in notes) Notes Kerlix and Coban base of toes to just below knee. foam used to protect toes Electronic Signature(s) Signed: 02/10/2017 4:56:49 PM By: Montey Hora Entered By: Montey Hora on 02/10/2017 15:57:32 Henry, [name] ([id number]) -------------------------------------------------------------------------------- Wound Assessment Details Patient Name: [name] Date of Service: 02/10/2017 3:45 PM Medical Record Number: [id number] Patient Account Number: [id number] Date of Birth/Sex: [birth year] (76 y.o. Female) Treating RN: Montey Hora Primary Care Dacotah Cabello: Leighton Ruff Other Clinician: Referring Markasia Carrol: Leighton Ruff Treating Braxden Lovering/Extender: Frann Rider in Treatment: 1 Wound Status Wound Number: 5 Primary Etiology: Lymphedema Wound Location: Left Toe Third Wound Status: Healed - Epithelialized Wounding Event: Gradually Appeared Date Acquired: 01/21/2017 Weeks Of Treatment: 1 Clustered Wound: No Photos Photo Uploaded By: Montey Hora on 02/10/2017 16:34:01 Wound Measurements Length: (cm) 0 % Reduction Width: (cm) 0 % Reduction Depth: (cm) 0 Area: (cm) 0 Volume: (cm) 0 in Area: 100% in Volume: 100% Wound Description Classification: Partial Thickness Periwound Skin Texture Texture Color No Abnormalities Noted: No No Abnormalities Noted: No Moisture No Abnormalities Noted:  No Electronic Signature(s) Signed: 02/10/2017 4:56:49 PM By: [name], [name] ([id number]) Entered By: Montey Hora on 02/10/2017 15:54:29 Limbert, [name] ([id number]) -------------------------------------------------------------------------------- Vitals Details Patient Name: [name] Date of Service: 02/10/2017 3:45 PM Medical Record Number: [id number] Patient Account Number: [id number] Date of Birth/Sex: June 27, 1941 (76 y.o. Female) Treating RN: Montey Hora Primary Care Deania Siguenza: Leighton Ruff Other Clinician: Referring Jylian Pappalardo: Leighton Ruff Treating Elchonon Maxson/Extender: Frann Rider in Treatment: 1 Vital Signs Time Taken: 15:42 Temperature (F): 97.9 Height (in): 63 Pulse (bpm): 53 Weight (lbs): 200 Respiratory Rate (breaths/min): 18 Body Mass Index (BMI): 35.4 Blood Pressure (mmHg): 129/42 Reference Range: 80 - 120 mg / dl Electronic Signature(s) Signed: 02/10/2017 4:56:49 PM By: Montey Hora Entered By: Montey Hora on 02/10/2017 15:42:38

## 2017-02-14 ENCOUNTER — Other Ambulatory Visit: Payer: Self-pay | Admitting: Pharmacist

## 2017-02-14 ENCOUNTER — Telehealth: Payer: Self-pay | Admitting: Cardiology

## 2017-02-14 NOTE — Patient Outreach (Signed)
[facility]) Care Management  02/14/2017  [name] 17-May-1941 [id number]   Mirna Mires called back from Dr. Marlou Porch office. Said they did not receive the forms faxed to them last week and requested they be resent to:  [phone].   Forms were resent with confirmation.  Mirna Mires was called to make sure they received the fax.  Mirna Mires confirmed they received the fax and will have Dr. Marlou Porch sign when he comes back into the office as he is on vacation.  [name], PharmD, [place] Clinical Pharmacist [phone]

## 2017-02-14 NOTE — Telephone Encounter (Signed)
New message  Alwyn Ren from [facility] call requesting to speak with RN. She states she faxed over a patient assistance packet and she wanted to f/u to see if it was received. Please call back to discuss

## 2017-02-14 NOTE — Patient Outreach (Addendum)
[place] [facility]) Care Management  02/14/2017  [name] 1940-11-10 [id number]   Patient was called to follow up on medication assistance forms.  HIPAA identifiers were obtained.  Dr. Marlou Porch office and [place] Patient Assistance Programs were called on the patient's behalf as well.  [place] does not have an application on file for the patient. Dr. Marlou Porch' office was sent forms via fax on 02/06/17. A message was left for a nurse to call back on the application today.  Application can be faxed back to the [facility] office and we can fax from here.   Plan:  Await a call from the provider.  Follow up in 3-5 business days.  [name], PharmD, [place] Clinical Pharmacist [phone]

## 2017-02-14 NOTE — Telephone Encounter (Signed)
Spoke with Dunn from [facility]. Patient assistant packet for Eliquis refaxed. Packet received and placed in Dr.Skains box to be signed when he is back in office.

## 2017-02-14 NOTE — Telephone Encounter (Signed)
Called and left message on [name]'s identified vm stating we had not received the packet she had faxed and requested she refax the information to [phone]. Left office number should she have any more questions.

## 2017-02-15 ENCOUNTER — Other Ambulatory Visit: Payer: Self-pay | Admitting: Cardiology

## 2017-02-18 ENCOUNTER — Encounter: Payer: Self-pay | Admitting: Pharmacist

## 2017-02-18 ENCOUNTER — Encounter: Payer: PPO | Admitting: Physician Assistant

## 2017-02-18 DIAGNOSIS — S91105D Unspecified open wound of left lesser toe(s) without damage to nail, subsequent encounter: Secondary | ICD-10-CM | POA: Diagnosis not present

## 2017-02-18 DIAGNOSIS — E11610 Type 2 diabetes mellitus with diabetic neuropathic arthropathy: Secondary | ICD-10-CM | POA: Diagnosis not present

## 2017-02-18 DIAGNOSIS — L97429 Non-pressure chronic ulcer of left heel and midfoot with unspecified severity: Secondary | ICD-10-CM | POA: Diagnosis not present

## 2017-02-19 ENCOUNTER — Other Ambulatory Visit: Payer: Self-pay | Admitting: Pharmacist

## 2017-02-19 NOTE — Telephone Encounter (Signed)
[name] aware that patient assistance form is in yellow folder on Dr Marlou Porch' cart waiting for his signature, she is aware Dr Marlou Porch will be in the office 02/24/17.

## 2017-02-19 NOTE — Progress Notes (Signed)
MA, MUNOZ ([id number]) Visit Report for 02/18/2017 Arrival Information Details Patient Name: [name], [name] Date of Service: 02/18/2017 3:30 PM Medical Record Number: [id number] Patient Account Number: [id number] Date of Birth/Sex: 07-03-1941 (76 y.o. Female) Treating RN: Afful, RN, BSN, Velva Harman Primary Care Rhandi Despain: Leighton Ruff Other Clinician: Referring [place] Espina: Leighton Ruff Treating Caliann Leckrone/Extender: Melburn Hake, HOYT Weeks in Treatment: 3 Visit Information History Since Last Visit All ordered tests and consults were completed: No Patient Arrived: Ambulatory Added or deleted any medications: No Arrival Time: 15:31 Any new allergies or adverse reactions: No Accompanied By: self Had a fall or experienced change in No Transfer Assistance: None activities of daily living that may affect Patient Identification Verified: Yes risk of falls: Secondary Verification Process Yes Signs or symptoms of abuse/neglect since last No Completed: visito Patient Has Alerts: Yes Hospitalized since last visit: No Patient Alerts: ABI Has Dressing in Place as Prescribed: Yes [id number] Pain Present Now: No Electronic Signature(s) Signed: 02/18/2017 4:31:04 PM By: Regan Lemming BSN, RN Entered By: Regan Lemming on 02/18/2017 15:34:01 Critcher, [name] ([id number]) -------------------------------------------------------------------------------- Clinic Level of Care Assessment Details Patient Name: [name] Date of Service: 02/18/2017 3:30 PM Medical Record Number: [id number] Patient Account Number: [id number] Date of Birth/Sex: [birth year] (76 y.o. Female) Treating RN: Afful, RN, BSN, Administrator, sports Primary Care Vollie Brunty: Leighton Ruff Other Clinician: Referring Chet Greenley: Leighton Ruff Treating Bettyann Birchler/Extender: Melburn Hake, HOYT Weeks in Treatment: 3 Clinic Level of Care Assessment Items TOOL 4 Quantity Score []  - Use when only an EandM is performed on FOLLOW-UP visit  0 ASSESSMENTS - Nursing Assessment / Reassessment X - Reassessment of Co-morbidities (includes updates in patient status) 1 10 X - Reassessment of Adherence to Treatment Plan 1 5 ASSESSMENTS - Wound and Skin Assessment / Reassessment []  - Simple Wound Assessment / Reassessment - one wound 0 []  - Complex Wound Assessment / Reassessment - multiple wounds 0 []  - Dermatologic / Skin Assessment (not related to wound area) 0 ASSESSMENTS - Focused Assessment []  - Circumferential Edema Measurements - multi extremities 0 []  - Nutritional Assessment / Counseling / Intervention 0 X - Lower Extremity Assessment (monofilament, tuning fork, pulses) 1 5 []  - Peripheral Arterial Disease Assessment (using hand held doppler) 0 ASSESSMENTS - Ostomy and/or Continence Assessment and Care []  - Incontinence Assessment and Management 0 []  - Ostomy Care Assessment and Management (repouching, etc.) 0 PROCESS - Coordination of Care X - Simple Patient / Family Education for ongoing care 1 15 []  - Complex (extensive) Patient / Family Education for ongoing care 0 []  - Staff obtains Programmer, systems, Records, Test Results / Process Orders 0 []  - Staff telephones HHA, Nursing Homes / Clarify orders / etc 0 []  - Routine Transfer to another Facility (non-emergent condition) 0 Rinehart, Bertine B. ([id number]) []  - Routine Hospital Admission (non-emergent condition) 0 []  - New Admissions / Biomedical engineer / Ordering NPWT, Apligraf, etc. 0 []  - Emergency Hospital Admission (emergent condition) 0 []  - Simple Discharge Coordination 0 []  - Complex (extensive) Discharge Coordination 0 PROCESS - Special Needs []  - Pediatric / Minor Patient Management 0 []  - Isolation Patient Management 0 []  - Hearing / Language / Visual special needs 0 []  - Assessment of Community assistance (transportation, D/C planning, etc.) 0 []  - Additional assistance / Altered mentation 0 []  - Support Surface(s) Assessment (bed, cushion, seat, etc.)  0 INTERVENTIONS - Wound Cleansing / Measurement X - Simple Wound Cleansing - one wound 1 5 []  - Complex Wound Cleansing - multiple wounds 0 X -  Wound Imaging (photographs - any number of wounds) 1 5 []  - Wound Tracing (instead of photographs) 0 []  - Simple Wound Measurement - one wound 0 []  - Complex Wound Measurement - multiple wounds 0 INTERVENTIONS - Wound Dressings []  - Small Wound Dressing one or multiple wounds 0 []  - Medium Wound Dressing one or multiple wounds 0 []  - Large Wound Dressing one or multiple wounds 0 []  - Application of Medications - topical 0 []  - Application of Medications - injection 0 INTERVENTIONS - Miscellaneous []  - External ear exam 0 Nance, Zayleigh B. ([id number]) []  - Specimen Collection (cultures, biopsies, blood, body fluids, etc.) 0 []  - Specimen(s) / Culture(s) sent or taken to Lab for analysis 0 []  - Patient Transfer (multiple staff / Harrel Lemon Lift / Similar devices) 0 []  - Simple Staple / Suture removal (25 or less) 0 []  - Complex Staple / Suture removal (26 or more) 0 []  - Hypo / Hyperglycemic Management (close monitor of Blood Glucose) 0 []  - Ankle / Brachial Index (ABI) - do not check if billed separately 0 X - Vital Signs 1 5 Has the patient been seen at the hospital within the last three years: Yes Total Score: 50 Level Of Care: New/Established - Level 2 Electronic Signature(s) Signed: 02/18/2017 4:31:04 PM By: Regan Lemming BSN, RN Entered By: Regan Lemming on 02/18/2017 16:30:02 [name] ([id number]) -------------------------------------------------------------------------------- Encounter Discharge Information Details Patient Name: [name] Date of Service: 02/18/2017 3:30 PM Medical Record Number: [id number] Patient Account Number: [id number] Date of Birth/Sex: [birth year] (76 y.o. Female) Treating RN: Afful, RN, BSN, Velva Harman Primary Care Kailana Benninger: Leighton Ruff Other Clinician: Referring Virgal Warmuth: Leighton Ruff Treating Jahnaya Branscome/Extender: Melburn Hake, HOYT Weeks in Treatment: 3 Encounter Discharge Information Items Discharge Pain Level: 0 Discharge Condition: Stable Ambulatory Status: Wheelchair Discharge Destination: Home Transportation: Private Auto Accompanied By: friend Schedule Follow-up Appointment: No Medication Reconciliation completed and provided to Patient/Care No Lacrecia Delval: Provided on Clinical Summary of Care: 02/18/2017 Form Type Recipient Paper Patient BI Electronic Signature(s) Signed: 02/18/2017 4:30:38 PM By: Regan Lemming BSN, RN Previous Signature: 02/18/2017 4:17:37 PM Version By: Ruthine Dose Entered By: Regan Lemming on 02/18/2017 16:30:37 Mizrahi, [name] ([id number]) -------------------------------------------------------------------------------- Lower Extremity Assessment Details Patient Name: [name] Date of Service: 02/18/2017 3:30 PM Medical Record Number: [id number] Patient Account Number: [id number] Date of Birth/Sex: 1941/01/10 (76 y.o. Female) Treating RN: Afful, RN, BSN, Velva Harman Primary Care Maxie Debose: Leighton Ruff Other Clinician: Referring Mustapha Colson: Leighton Ruff Treating Ledger Heindl/Extender: Melburn Hake, HOYT Weeks in Treatment: 3 Vascular Assessment Pulses: Dorsalis Pedis Palpable: [Left:Yes] Posterior Tibial Extremity colors, hair growth, and conditions: Extremity Color: [Left:Mottled] Hair Growth on Extremity: [Left:No] Temperature of Extremity: [Left:Warm] Capillary Refill: [Left:< 3 seconds] Toe Nail Assessment Left: Right: Thick: Yes Discolored: Yes Deformed: No Improper Length and Hygiene: No Electronic Signature(s) Signed: 02/18/2017 4:31:04 PM By: Regan Lemming BSN, RN Entered By: Regan Lemming on 02/18/2017 15:51:25 Rivkin, [name] ([id number]) -------------------------------------------------------------------------------- Multi Wound Chart Details Patient Name: [name] Date of Service: 02/18/2017  3:30 PM Medical Record Number: [id number] Patient Account Number: [id number] Date of Birth/Sex: 1940-10-21 (77 y.o. Female) Treating RN: Baruch Gouty, RN, BSN, Velva Harman Primary Care Jeweline Reif: Leighton Ruff Other Clinician: Referring Jabree Rebert: Leighton Ruff Treating Mayvis Agudelo/Extender: Melburn Hake, HOYT Weeks in Treatment: 3 Vital Signs Height(in): 63 Pulse(bpm): 60 Weight(lbs): 200 Blood Pressure 158/59 (mmHg): Body Mass Index(BMI): 35 Temperature(F): 97.9 Respiratory Rate 18 (breaths/min): Photos: [3:No Photos] [4:No Photos] [N/A:N/A] Wound Location: [3:Left Calcaneus] [4:Left Toe Second] [N/A:N/A] Wounding Event: [3:Gradually Appeared] [  4:Gradually Appeared] [N/A:N/A] Primary Etiology: [3:Lymphedema] [4:Lymphedema] [N/A:N/A] Comorbid History: [3:Anemia, Coronary Artery Anemia, Coronary Artery N/A Disease, Hypertension, Myocardial Infarction, Type II Diabetes, Osteoarthritis, Neuropathy Osteoarthritis, Neuropathy] [4:Disease, Hypertension, Myocardial Infarction, Type II  Diabetes,] Date Acquired: [3:01/21/2017] [4:01/21/2017] [N/A:N/A] Weeks of Treatment: [3:3] [4:3] [N/A:N/A] Wound Status: [3:Healed - Epithelialized] [4:Healed - Epithelialized] [N/A:N/A] Measurements L x W x D 0x0x0 [4:0x0x0] [N/A:N/A] (cm) Area (cm) : [3:0] [4:0] [N/A:N/A] Volume (cm) : [3:0] [4:0] [N/A:N/A] % Reduction in Area: [3:100.00%] [4:100.00%] [N/A:N/A] % Reduction in Volume: 100.00% [4:100.00%] [N/A:N/A] Classification: [3:Full Thickness Without Exposed Support Structures] [4:Partial Thickness] [N/A:N/A] HBO Classification: [3:Grade 1] [4:Grade 1] [N/A:N/A] Exudate Amount: [3:None Present] [4:None Present] [N/A:N/A] Wound Margin: [3:Flat and Intact] [4:Flat and Intact] [N/A:N/A] Granulation Amount: [3:Large (67-100%)] [4:None Present (0%)] [N/A:N/A] Granulation Quality: [3:Pink] [4:N/A] [N/A:N/A] Necrotic Amount: [3:None Present (0%)] [4:None Present (0%)] [N/A:N/A] Exposed Structures: [3:Fascia: No  Fat Layer (Subcutaneous Fat Layer (Subcutaneous Tissue) Exposed: No] [4:Fascia: No Tissue) Exposed: No] [N/A:N/A] Tendon: No Tendon: No Muscle: No Muscle: No Joint: No Joint: No Bone: No Bone: No Epithelialization: Large (67-100%) Large (67-100%) N/A Periwound Skin Texture: Excoriation: No Excoriation: No N/A Induration: No Induration: No Callus: No Callus: No Crepitus: No Crepitus: No Rash: No Rash: No Scarring: No Scarring: No Periwound Skin Maceration: No Maceration: No N/A Moisture: Dry/Scaly: No Dry/Scaly: No Periwound Skin Color: Atrophie Blanche: No Atrophie Blanche: No N/A Cyanosis: No Cyanosis: No Ecchymosis: No Ecchymosis: No Erythema: No Erythema: No Hemosiderin Staining: No Hemosiderin Staining: No Mottled: No Mottled: No Pallor: No Pallor: No Rubor: No Rubor: No Temperature: No Abnormality No Abnormality N/A Tenderness on No No N/A Palpation: Wound Preparation: Ulcer Cleansing: Ulcer Cleansing: N/A Rinsed/Irrigated with Rinsed/Irrigated with Saline, Other: soap and Saline, Other: soap and water water Topical Anesthetic Topical Anesthetic Applied: None Applied: None Treatment Notes Electronic Signature(s) Signed: 02/18/2017 4:31:04 PM By: Regan Lemming BSN, RN Entered By: Regan Lemming on 02/18/2017 16:02:54 Boullion, [name] ([id number]) -------------------------------------------------------------------------------- Multi-Disciplinary Care Plan Details Patient Name: [name] Date of Service: 02/18/2017 3:30 PM Medical Record Number: [id number] Patient Account Number: [id number] Date of Birth/Sex: [birth year] (76 y.o. Female) Treating RN: Baruch Gouty, RN, BSN, Velva Harman Primary Care Annalysa Mohammad: Leighton Ruff Other Clinician: Referring Quest Tavenner: Leighton Ruff Treating Denver Bentson/Extender: Melburn Hake, HOYT Weeks in Treatment: 3 Active Inactive Electronic Signature(s) Signed: 02/18/2017 4:31:04 PM By: Regan Lemming BSN, RN Entered By: Regan Lemming on 02/18/2017 16:02:14 [name] ([id number]) -------------------------------------------------------------------------------- Pain Assessment Details Patient Name: [name] Date of Service: 02/18/2017 3:30 PM Medical Record Number: [id number] Patient Account Number: [id number] Date of Birth/Sex: 1940-10-31 (76 y.o. Female) Treating RN: Afful, RN, BSN, Velva Harman Primary Care Marijayne Rauth: Leighton Ruff Other Clinician: Referring Contessa Preuss: Leighton Ruff Treating Kasondra Junod/Extender: Melburn Hake, HOYT Weeks in Treatment: 3 Active Problems Location of Pain Severity and Description of Pain Patient Has Paino No Site Locations Pain Management and Medication Current Pain Management: Electronic Signature(s) Signed: 02/18/2017 4:31:04 PM By: Regan Lemming BSN, RN Entered By: Regan Lemming on 02/18/2017 15:34:11 [name] ([id number]) -------------------------------------------------------------------------------- Patient/Caregiver Education Details Patient Name: [name] Date of Service: 02/18/2017 3:30 PM Medical Record Number: [id number] Patient Account Number: [id number] Date of Birth/Gender: Jan 14, 1941 (76 y.o. Female) Treating RN: Afful, RN, BSN, Velva Harman Primary Care Physician: Leighton Ruff Other Clinician: Referring Physician: Leighton Ruff Treating Physician/Extender: Sharalyn Ink in Treatment: 3 Education Assessment Education Provided To: Patient Education Topics Provided Electronic Signature(s) Signed: 02/18/2017 4:31:04 PM By: Regan Lemming BSN, RN Entered By: Regan Lemming on 02/18/2017 16:30:56  MAKEISHA, JENTSCH ([id number]) -------------------------------------------------------------------------------- Wound Assessment Details Patient Name: QUINTESSA, SIMMERMAN Date of Service: 02/18/2017 3:30 PM Medical Record Number: [id number] Patient Account Number: [id number] Date of Birth/Sex: [birth year] (76 y.o. Female) Treating RN: Afful, RN,  BSN, Administrator, sports Primary Care Margit Batte: Leighton Ruff Other Clinician: Referring Madoline Bhatt: Leighton Ruff Treating Suzann Lazaro/Extender: Melburn Hake, HOYT Weeks in Treatment: 3 Wound Status Wound Number: 3 Primary Lymphedema Etiology: Wound Location: Left Calcaneus Wound Healed - Epithelialized Wounding Event: Gradually Appeared Status: Date Acquired: 01/21/2017 Comorbid Anemia, Coronary Artery Disease, Weeks Of Treatment: 3 History: Hypertension, Myocardial Infarction, Clustered Wound: No Type II Diabetes, Osteoarthritis, Neuropathy Photos Photo Uploaded By: Regan Lemming on 02/18/2017 16:38:43 Wound Measurements Length: (cm) 0 % Reduction in Width: (cm) 0 % Reduction in Depth: (cm) 0 Epithelializat Area: (cm) 0 Tunneling: Volume: (cm) 0 Undermining: Area: 100% Volume: 100% ion: Large (67-100%) No No Wound Description Full Thickness Without Foul Odor Aft Classification: Exposed Support Structures Slough/Fibrin Grade 1 Carnero, Edelyn B. ([id number]) er Cleansing: No o Yes Diabetic Severity Earleen Newport): Wound Margin: Flat and Intact Exudate Amount: None Present Wound Bed Granulation Amount: Large (67-100%) Exposed Structure Granulation Quality: Pink Fascia Exposed: No Necrotic Amount: None Present (0%) Fat Layer (Subcutaneous Tissue) Exposed: No Tendon Exposed: No Muscle Exposed: No Joint Exposed: No Bone Exposed: No Periwound Skin Texture Texture Color No Abnormalities Noted: No No Abnormalities Noted: No Callus: No Atrophie Blanche: No Crepitus: No Cyanosis: No Excoriation: No Ecchymosis: No Induration: No Erythema: No Rash: No Hemosiderin Staining: No Scarring: No Mottled: No Pallor: No Moisture Rubor: No No Abnormalities Noted: No Dry / Scaly: No Temperature / Pain Maceration: No Temperature: No Abnormality Wound Preparation Ulcer Cleansing: Rinsed/Irrigated with Saline, Other: soap and water, Topical Anesthetic Applied: None Electronic  Signature(s) Signed: 02/18/2017 4:31:04 PM By: Regan Lemming BSN, RN Entered By: Regan Lemming on 02/18/2017 16:01:28 Botkin, [name] ([id number]) -------------------------------------------------------------------------------- Wound Assessment Details Patient Name: [name] Date of Service: 02/18/2017 3:30 PM Medical Record Number: [id number] Patient Account Number: [id number] Date of Birth/Sex: Oct 08, 1940 (76 y.o. Female) Treating RN: Afful, RN, BSN, Administrator, sports Primary Care Nevae Pinnix: Leighton Ruff Other Clinician: Referring Shelbey Spindler: Leighton Ruff Treating Alysson Geist/Extender: Melburn Hake, HOYT Weeks in Treatment: 3 Wound Status Wound Number: 4 Primary Lymphedema Etiology: Wound Location: Left Toe Second Wound Healed - Epithelialized Wounding Event: Gradually Appeared Status: Date Acquired: 01/21/2017 Comorbid Anemia, Coronary Artery Disease, Weeks Of Treatment: 3 History: Hypertension, Myocardial Infarction, Clustered Wound: No Type II Diabetes, Osteoarthritis, Neuropathy Photos Photo Uploaded By: Regan Lemming on 02/18/2017 16:39:14 Wound Measurements Length: (cm) 0 % Reduction Width: (cm) 0 % Reduction Depth: (cm) 0 Epitheliali Area: (cm) 0 Tunneling: Volume: (cm) 0 Underminin in Area: 100% in Volume: 100% zation: Large (67-100%) No g: No Wound Description Classification: Partial Thickness Foul Odor A Diabetic Severity (Wagner): Grade 1 Slough/Fibr Wound Margin: Flat and Intact Exudate Amount: None Present fter Cleansing: No ino No Wound Bed Granulation Amount: None Present (0%) Exposed Structure Necrotic Amount: None Present (0%) Fascia Exposed: No Fat Layer (Subcutaneous Tissue) Exposed: No Tendon Exposed: No Hynes, Jerene B. ([id number]) Muscle Exposed: No Joint Exposed: No Bone Exposed: No Periwound Skin Texture Texture Color No Abnormalities Noted: No No Abnormalities Noted: No Callus: No Atrophie Blanche: No Crepitus: No Cyanosis:  No Excoriation: No Ecchymosis: No Induration: No Erythema: No Rash: No Hemosiderin Staining: No Scarring: No Mottled: No Pallor: No Moisture Rubor: No No Abnormalities Noted: No Dry / Scaly: No Temperature / Pain Maceration: No Temperature: No Abnormality Wound Preparation Ulcer Cleansing:  Rinsed/Irrigated with Saline, Other: soap and water, Topical Anesthetic Applied: None Electronic Signature(s) Signed: 02/18/2017 4:31:04 PM By: Regan Lemming BSN, RN Entered By: Regan Lemming on 02/18/2017 16:01:42 Pottinger, [name] ([id number]) -------------------------------------------------------------------------------- Vitals Details Patient Name: [name] Date of Service: 02/18/2017 3:30 PM Medical Record Number: [id number] Patient Account Number: [id number] Date of Birth/Sex: [birth year] (76 y.o. Female) Treating RN: Afful, RN, BSN, Velva Harman Primary Care Jolon Degante: Leighton Ruff Other Clinician: Referring Adaliah Hiegel: Leighton Ruff Treating Elene Downum/Extender: Melburn Hake, HOYT Weeks in Treatment: 3 Vital Signs Time Taken: 15:35 Temperature (F): 97.9 Height (in): 63 Pulse (bpm): 60 Weight (lbs): 200 Respiratory Rate (breaths/min): 18 Body Mass Index (BMI): 35.4 Blood Pressure (mmHg): 158/59 Reference Range: 80 - 120 mg / dl Electronic Signature(s) Signed: 02/18/2017 4:31:04 PM By: Regan Lemming BSN, RN Entered By: Regan Lemming on 02/18/2017 15:36:29

## 2017-02-19 NOTE — Telephone Encounter (Signed)
Follow up    Alwyn Ren from [facility] is calling back about paper work that was faxed over for pt. Please call.

## 2017-02-19 NOTE — Progress Notes (Signed)
[name] ([id number]) Visit Report for 02/18/2017 Chief Complaint Document Details Patient Name: [name], [name] Date of Service: 02/18/2017 3:30 PM Medical Record Number: [id number] Patient Account Number: [id number] Date of Birth/Sex: 18-Nov-1940 (76 y.o. Female) Treating RN: Afful, RN, BSN, Velva Harman Primary Care Provider: Leighton Ruff Other Clinician: Referring Provider: Leighton Ruff Treating Provider/Extender: Melburn Hake, HOYT Weeks in Treatment: 3 Information Obtained from: Patient Chief Complaint Patient is here for review of a wound on the left heel Electronic Signature(s) Signed: 02/18/2017 7:35:55 PM By: Worthy Keeler PA-C Entered By: Worthy Keeler on 02/18/2017 15:54:50 Jacot, [name] ([id number]) -------------------------------------------------------------------------------- HPI Details Patient Name: [name] Date of Service: 02/18/2017 3:30 PM Medical Record Number: [id number] Patient Account Number: [id number] Date of Birth/Sex: 13-May-1941 (76 y.o. Female) Treating RN: Afful, RN, BSN, Velva Harman Primary Care Provider: Leighton Ruff Other Clinician: Referring Provider: Leighton Ruff Treating Provider/Extender: Sharalyn Ink in Treatment: 3 History of Present Illness HPI Description: 08/21/16; [name] is a 76 year old woman who I cared for in our [facility] clinic up until January 2016. At that point she had a wound in her left great toe. 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 He follows with vein and vascular and has a follow-up appointment in early February. She is status left femoral to anterior tibial bypass in February 2016. She is also noted to have venous stasis changes and a history of ulcers. 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 fib on Eliquis. She has bladder cancer and has a chronic urostomy. The patient tells me that 2 weeks ago she had a right prosthesis and a new left diabetic shoe. Roughly one week ago she noted a wound on her left lateral heel. She zinc oxide and some silver alginate she had left over until she could be seen here. It was also noted that she has had blistering on her posterior left calf and an open area here as well. Her records note that she has chronic venous insufficiency with chronic inflammation especially in the left mid calf area. The patient has not been systemically unwell no fever or chills. She is reasonably insensate and has not felt any pain 09/04/16; the patient has a fairly substantial wound on the left lateral calcaneus. We have been using Santyl to this area. Blister on the left lower leg appears to of healed the patient also follows with Dr. Sherren Mocha early of vein and vascular. He felt that she should have enough blood flow to heal her left heel wound. He renewed her doxycycline for a further week. She is status post left femoral to anterior tibial bypass in February 2016. 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 She has a remote history of a right BKA. The XRAY that I ordered last week showed no definite evidence of osteomyelitis however a small focus of demineralization was noted in the plantar aspect of the calcaneus which might warrant an MRI possibly indicating early osteomyelitis however this is not in the area of her wound and I'm going to keep this under advisement for now. 09/11/16; still a substantial wound in the left lateral calcaneus we've been using Santyl 09/18/16; still a substantial  wound on the left lateral calcaneus. We are still using Santyl 09/24/16; still a substantial wound on the left lateral calcaneus. We're using Santyl change one or 2 times  a week and mechanical debridement which he comes to [place]. 10/02/16; left lateral calcaneus using Santyl changing 3 times a week. Run Apligraf through her insurance 10/09/16; still using Santyl without much change. Being changed 2 times a week by the patient and once here. We did not run Apligraf last week we'll do that this week 10/16/16; the area on her left lateral heel; started Iodoflex last week. Apligraf set a co-pay of $250 per for [name], [name] B. ([id number]) application 5/39/76; left lateral heel. on iodoflex. approved for appligraft, start next week 10/30/16 Apligraf #1 today 11/13/16- patient is here for follow up evaluation of her left lateral calcaneus ulcer. She had Apligraf #1 placed 2 weeks ago. She is tolerating compression. She is voicing no complaints or concerns. She did return last week for nurse visit. 11/27/16; dramatic improvement in the overall appearance of this wound. What looks to be a tinea infection around this. Apligraf #3 12/11/16; only a small healthy granulated oval wound remains on the lateral aspect of her calcaneus. I think this is probably too small to consider an Apligraf that this point although I gave this some thought. 12/18/16; wound is smaller but still not closed. We'll use Hydrofera Blue last week. Switch to Promogran this week 12/25/16 Wound is totally epithelialized. No surrounding erythema. This was originally a pressure injury at the time of new footwear on her righ prosthesis She has PAD for which she follows with Dr. Donnetta Hutching 01/28/17 READMISSION this is a patient with type 2 diabetes and complications including PAD, neuropathy. Prior history of foot wounds with an amputation of the left great toe and right BKA. We had her in clinic up until a month ago with a very difficult wound on the lateral left heel. She was eventually healed out after a prolonged course of Apligraf. She did well apparently recently she states she was washing her leg and bathing her  leg and noted some skin come off her foot. She is also recently noted blisters on her left calf. She states she does not have compression stockings which is at odds with my last note when we discharged her on 12/25/16. In any case she is here for review in view of multiple concerns on the left leg and foot. She has not been using her foot wear using only the healing sandal that we gave her. She follows with Dr. early for her PAD and tells me/reminds me that the last time she saw him he told her that her vascular supply with quite good I'll need to review this. 6/66/18; patient's wounds looks somewhat better today. Culture I did last week on the left second toe was negative she is completed antibiotics. She had a sanguinous blister on her left third toe which I debridement with a curette today so we can probably dresser wound. The area on the left lateral heel looks as though it's progressing towards closure or edema control is better and the blisters on her anterior leg are also improved. We have been using silver alginate all her wounds 02/10/2017 -- she is here to see me today because of the upcoming holiday and has been doing well overall. 02/18/17 on evaluation today patient's wound appears to be completely healed. She is having no pain and no complications. She does need to be shown how to apply  the Juzo compression wrap. Electronic Signature(s) Signed: 02/18/2017 7:35:55 PM By: Worthy Keeler PA-C Entered By: Worthy Keeler on 02/18/2017 16:03:26 [name], [name] ([id number]) -------------------------------------------------------------------------------- Physical Exam Details Patient Name: [name] Date of Service: 02/18/2017 3:30 PM Medical Record Number: [id number] Patient Account Number: [id number] Date of Birth/Sex: [birth year] (76 y.o. Female) Treating RN: Baruch Gouty, RN, BSN, Velva Harman Primary Care Provider: Leighton Ruff Other Clinician: Referring Provider: Leighton Ruff Treating Provider/Extender: Melburn Hake, HOYT Weeks in Treatment: 3 Constitutional Well-nourished and well-hydrated in no acute distress. Respiratory normal breathing without difficulty. Psychiatric this patient is able to make decisions and demonstrates good insight into disease process. Alert and Oriented x 3. pleasant and cooperative. Notes Patient has some dry skin overlying the wound but no obvious opening and definitely no discharge this appears to be healed. Electronic Signature(s) Signed: 02/18/2017 7:35:55 PM By: Worthy Keeler PA-C Entered By: Worthy Keeler on 02/18/2017 16:03:49 Rusk, [name] ([id number]) -------------------------------------------------------------------------------- Physician Orders Details Patient Name: [name] Date of Service: 02/18/2017 3:30 PM Medical Record Number: [id number] Patient Account Number: [id number] Date of Birth/Sex: [birth year] (76 y.o. 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 Discharge From [facility] o Discharge from [place] Completed Electronic Signature(s) Signed: 02/18/2017 4:31:04 PM By: Regan Lemming BSN, RN Signed: 02/18/2017 7:35:55 PM By: Worthy Keeler PA-C Entered By: Regan Lemming on 02/18/2017 16:06:37 [name], [name] ([id number]) -------------------------------------------------------------------------------- Problem List Details Patient Name: [name] Date of Service: 02/18/2017 3:30 PM Medical Record Number: [id number] Patient Account Number: [id number] Date of Birth/Sex: Dec 05, 1940 (76 y.o. Female) Treating RN: Afful, RN, BSN, Velva Harman Primary Care Provider: Leighton Ruff Other Clinician: Referring Provider: Leighton Ruff Treating Provider/Extender: Worthy Keeler Weeks in Treatment: 3 Active Problems ICD-10 Encounter Code Description Active Date Diagnosis E11.610 Type 2 diabetes mellitus with diabetic neuropathic 01/28/2017 Yes arthropathy E11.51 Type 2 diabetes mellitus with diabetic peripheral 01/28/2017 Yes angiopathy without gangrene E11.40 Type 2 diabetes mellitus with diabetic neuropathy, 01/28/2017 Yes unspecified L97.423 Non-pressure chronic ulcer of left heel and midfoot with 01/28/2017 Yes necrosis of muscle L97.528 Non-pressure chronic ulcer of other part of left foot with 01/28/2017 Yes other specified severity I87.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 hypertension (idiopathic) with 01/28/2017 Yes inflammation of left lower extremity L03.116 Cellulitis of left lower limb 01/28/2017 Yes Inactive Problems Resolved Problems Electronic Signature(s) Signed: 02/18/2017 7:35:55 PM By: Sebastian Ache, Jalasia B. ([id number]) Entered By: Worthy Keeler on 02/18/2017 15:54:34 [name], [name] ([id number]) -------------------------------------------------------------------------------- Progress Note Details Patient Name: [name] Date of Service: 02/18/2017 3:30 PM Medical Record Number: [id number] Patient Account Number: [id number] Date of  Birth/Sex: [birth year] (76 y.o. Female) Treating RN: Afful, RN, BSN, [employer] Primary Care Provider: Leighton Ruff Other Clinician: Referring Provider: Leighton Ruff Treating Provider/Extender:  STONE III, HOYT Weeks in Treatment: 3 Subjective Chief Complaint Information obtained from Patient Patient is here for review of a wound on the left heel History of Present Illness (HPI) 08/21/16; Mrs. Stribling is a 76 year old woman who I cared for in our [facility] clinic up until January 2016. At that point she had a wound in her left great toe. 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 He follows with vein and vascular and has a follow-up appointment in early February. She is status left femoral to anterior tibial bypass in February 2016. She is also noted to have venous stasis changes and a history of ulcers. 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 fib on Eliquis. She has bladder cancer and has a chronic urostomy. The patient tells me that 2 weeks ago she had a right prosthesis and a new left diabetic shoe. Roughly one week ago she noted a wound on her left lateral heel. She zinc oxide and some silver alginate she had left over until she could be seen here. It was also noted that she has had blistering on her posterior left calf and an open area here as well. Her records note that she has chronic venous insufficiency with chronic inflammation especially in the left mid calf area. The patient has not been systemically unwell no fever or chills. She is reasonably insensate and has not felt any pain 09/04/16; the patient has a fairly substantial wound on the left lateral calcaneus. We have been using  Santyl to this area. Blister on the left lower leg appears to of healed the patient also follows with Dr. Sherren Mocha early of vein and vascular. He felt that she should have enough blood flow to heal her left heel wound. He renewed her doxycycline for a further week. She is status post left femoral to anterior tibial bypass in February 2016. 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 She has a remote history of a right BKA. The XRAY that I ordered last week showed no definite evidence of osteomyelitis however a small focus of demineralization was noted in the plantar aspect of the calcaneus which might warrant an MRI possibly indicating early osteomyelitis however this is not in the area of her wound and I'm going to keep this under advisement for now. 09/11/16; still a substantial wound in the left lateral calcaneus we've been using Santyl 09/18/16; still a substantial wound on the left lateral calcaneus. We are still using Santyl 09/24/16; still a substantial wound on the left lateral calcaneus. We're using Santyl change one or 2 times a [name], [name] B. ([id number]) week and mechanical debridement which he comes to [place]. 10/02/16; left lateral calcaneus using Santyl changing 3 times a week. Run Apligraf through her insurance 10/09/16; still using Santyl without much change. Being changed 2 times a week by the patient and once here. We did not run Apligraf last week we'll do that this week 10/16/16; the area on her left lateral heel; started Iodoflex last week. Apligraf set a co-pay of $814 per for application 4/81/85; left lateral heel. on iodoflex. approved for appligraft, start next week 10/30/16 Apligraf #1 today 11/13/16- patient is here for follow up evaluation  of her left lateral calcaneus ulcer. She had Apligraf #1 placed 2 weeks ago. She is tolerating compression. She is voicing no complaints or concerns. She did return last week for nurse visit. 11/27/16; dramatic improvement in the overall  appearance of this wound. What looks to be a tinea infection around this. Apligraf #3 12/11/16; only a small healthy granulated oval wound remains on the lateral aspect of her calcaneus. I think this is probably too small to consider an Apligraf that this point although I gave this some thought. 12/18/16; wound is smaller but still not closed. We'll use Hydrofera Blue last week. Switch to Promogran this week 12/25/16 Wound is totally epithelialized. No surrounding erythema. This was originally a pressure injury at the time of new footwear on her righ prosthesis She has PAD for which she follows with Dr. Donnetta Hutching 01/28/17 READMISSION this is a patient with type 2 diabetes and complications including PAD, neuropathy. Prior history of foot wounds with an amputation of the left great toe and right BKA. We had her in clinic up until a month ago with a very difficult wound on the lateral left heel. She was eventually healed out after a prolonged course of Apligraf. She did well apparently recently she states she was washing her leg and bathing her leg and noted some skin come off her foot. She is also recently noted blisters on her left calf. She states she does not have compression stockings which is at odds with my last note when we discharged her on 12/25/16. In any case she is here for review in view of multiple concerns on the left leg and foot. She has not been using her foot wear using only the healing sandal that we gave her. She follows with Dr. early for her PAD and tells me/reminds me that the last time she saw him he told her that her vascular supply with quite good I'll need to review this. 6/66/18; patient's wounds looks somewhat better today. Culture I did last week on the left second toe was negative she is completed antibiotics. She had a sanguinous blister on her left third toe which I debridement with a curette today so we can probably dresser wound. The area on the left lateral heel looks as  though it's progressing towards closure or edema control is better and the blisters on her anterior leg are also improved. We have been using silver alginate all her wounds 02/10/2017 -- she is here to see me today because of the upcoming holiday and has been doing well overall. 02/18/17 on evaluation today patient's wound appears to be completely healed. She is having no pain and no complications. She does need to be shown how to apply the Juzo compression wrap. [name], [name] B. ([id number]) Objective Constitutional Well-nourished and well-hydrated in no acute distress. Vitals Time Taken: 3:35 PM, Height: 63 in, Weight: 200 lbs, BMI: 35.4, Temperature: 97.9 F, Pulse: 60 bpm, Respiratory Rate: 18 breaths/min, Blood Pressure: 158/59 mmHg. Respiratory normal breathing without difficulty. Psychiatric this patient is able to make decisions and demonstrates good insight into disease process. Alert and Oriented x 3. pleasant and cooperative. General Notes: Patient has some dry skin overlying the wound but no obvious opening and definitely no discharge this appears to be healed. Integumentary (Hair, Skin) Wound #3 status is Healed - Epithelialized. Original cause of wound was Gradually Appeared. The wound is located on the Left Calcaneus. The wound measures 0cm length x 0cm width x 0cm depth; 0cm^2 area and 0cm^3  volume. There is no tunneling or undermining noted. There is a none present amount of drainage noted. The wound margin is flat and intact. There is large (67-100%) pink granulation within the wound bed. There is no necrotic tissue within the wound bed. The periwound skin appearance did not exhibit: Callus, Crepitus, Excoriation, Induration, Rash, Scarring, Dry/Scaly, Maceration, Atrophie Blanche, Cyanosis, Ecchymosis, Hemosiderin Staining, Mottled, Pallor, Rubor, Erythema. Periwound temperature was noted as No Abnormality. Wound #4 status is Healed - Epithelialized. Original cause of  wound was Gradually Appeared. The wound is located on the Left Toe Second. The wound measures 0cm length x 0cm width x 0cm depth; 0cm^2 area and 0cm^3 volume. There is no tunneling or undermining noted. There is a none present amount of drainage noted. The wound margin is flat and intact. There is no granulation within the wound bed. There is no necrotic tissue within the wound bed. The periwound skin appearance did not exhibit: Callus, Crepitus, Excoriation, Induration, Rash, Scarring, Dry/Scaly, Maceration, Atrophie Blanche, Cyanosis, Ecchymosis, Hemosiderin Staining, Mottled, Pallor, Rubor, Erythema. Periwound temperature was noted as No Abnormality. Assessment Active Problems ICD-10 E11.610 - Type 2 diabetes mellitus with diabetic neuropathic arthropathy E11.51 - Type 2 diabetes mellitus with diabetic peripheral angiopathy without gangrene [name], [name] B. 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 Discharge From [facility]: Discharge from [place] Completed We will continue for the next week to apply and Allevyn dressing for protection and patient have some of these that she purchased personally that she can use. This would just be to prevent anything from reopening in the interim. Otherwise we will discontinue wound care services at this point and see her for reevaluation future as needed if anything reopens or worsens. Electronic Signature(s) Signed: 02/18/2017 7:35:55 PM By: Worthy Keeler  PA-C Entered By: Worthy Keeler on 02/18/2017 16:46:01 [name], [name] ([id number]) -------------------------------------------------------------------------------- SuperBill Details Patient Name: [name] Date of Service: 02/18/2017 Medical Record Number: [id number] Patient Account Number: [id number] Date of Birth/Sex: 1941-01-11 (76 y.o. 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 L97.528 Non-pressure chronic ulcer of other part of left foot with other specified severity I87.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 L03.116 Cellulitis of left lower limb Facility Procedures CPT4 Code: [id number] Description: 40102 - WOUND CARE VISIT-LEV 2 EST PT Modifier: Quantity: 1 Physician Procedures CPT4: Description Modifier Quantity Code [id number] - WC PHYS LEVEL 2 - EST PT 1 ICD-10 Description Diagnosis E11.610 Type 2 diabetes mellitus with diabetic neuropathic arthropathy E11.51 Type 2 diabetes mellitus with diabetic peripheral angiopathy  without gangrene E11.40 Type 2 diabetes mellitus with diabetic neuropathy, unspecified L97.423 Non-pressure chronic ulcer of left heel and midfoot with  necrosis of muscle Electronic Signature(s) Signed: 02/18/2017 7:35:55 PM By: Worthy Keeler PA-C Entered By: Worthy Keeler on 02/18/2017 16:46:30

## 2017-02-19 NOTE — Patient Outreach (Addendum)
[facility]) Care Management  02/19/2017  [name] October 15, 1940 [id number]   [place] [employer] was called to follow up on patient assistance forms. The forms were sent to Dr. Marlou Porch' office with instructions for them to be sent back to the [place]. Since the forms were not received but it was confirmed Dr. Marlou Porch' office received the forms and said they would send them, the foundation was called just to be sure things did not get mixed up.  Unfortunately, the [employer] said they did not have an application for the patient.  However, while speaking with the representative, she opened an account for the patient and said she was going to send out another application to the patient's home.  A message was left for Dr. Marlou Porch' nurse to follow up on the forms.  The patient was called to update her on the status and she did not answer the phone.  HIPAA compliant message was left on her voicemail.  Plan:  Call patient back in 5-7 business days    ADDENDUM Nurse from Dr. Marlou Porch' office called back and said they have the forms and they are in Dr. Marlou Porch' box but he will not be back until 02/24/17  [name], PharmD, [place] Clinical Pharmacist [phone]

## 2017-02-24 NOTE — Telephone Encounter (Signed)
Paperwork signed and taken to MR to be faxed.

## 2017-02-25 ENCOUNTER — Other Ambulatory Visit: Payer: Self-pay | Admitting: Pharmacist

## 2017-02-25 NOTE — Patient Outreach (Signed)
[facility]) Care Management  02/25/2017  [name] Dec 10, 1940 [id number]   Dr. Marlou Porch' office was called to follow up on patient assistance forms. The forms were signed but the license number and NPI number were missing.  Spoke with Mirna Mires who said she could assist me with that information and asked that I call back to the main line and ask to be connected with Medical Records.  I stayed on hold for >10 minutes while the issue with the form was discussed without me being muted.  Dr. Kandis Mannan NPI and [place] number were researched online and added to the form.  Forms were faxed to [place].   Plan:  Follow up with patient and [place]  in 10-14 business days.   [name], PharmD, [place] Clinical Pharmacist [phone]

## 2017-02-26 ENCOUNTER — Ambulatory Visit: Payer: Self-pay | Admitting: Pharmacist

## 2017-03-04 ENCOUNTER — Other Ambulatory Visit: Payer: Self-pay | Admitting: Pharmacist

## 2017-03-04 NOTE — Patient Outreach (Signed)
[facility]) Care Management  03/04/2017  [name] [birth year] [id number]   [place] on patient's behalf to inquire about her application.  Representative stated they received the patient's application but it is still in process. Patient was called to notify her of the status of her application. Unfortunately, she did not answer the phone.  HIPAA compliant message was left on the patient's voicemail.  Plan:  Call [place] back in 7-10 business days to check the status of the application.   [name], PharmD, [place] Clinical Pharmacist [phone]

## 2017-03-14 ENCOUNTER — Telehealth: Payer: Self-pay | Admitting: Cardiology

## 2017-03-14 ENCOUNTER — Other Ambulatory Visit: Payer: Self-pay | Admitting: Pharmacist

## 2017-03-14 MED ORDER — APIXABAN 5 MG PO TABS: 5.0000 mg | ORAL_TABLET | Freq: Two times a day (BID) | ORAL | 1 refills | Status: DC

## 2017-03-14 NOTE — Patient Outreach (Signed)
[facility]) Care Management  03/14/2017  [name] 07-15-1941 [id number]   [place] on patient's behalf to inquire about her application.  Representative stated they received the patient's application but are in need of a prescription for Eliquis from Dr. Marlou Porch.. Patient was called to notify her of the status of her application. Unfortunately, she did not answer the phone.  HIPAA compliant message was left on the patient's voicemail.  A message was also left at Dr. Marlou Porch office with Thomes Dinning requesting a new prescription for a 90 day supply with at least 1 refill be sent to: [phone]  Plan:  Call [place] back in 7-10 business days to check the status of the application.   [name], PharmD, [place] Clinical Pharmacist [phone]

## 2017-03-14 NOTE — Telephone Encounter (Signed)
New message        *STAT* If patient is at the pharmacy, call can be transferred to refill team.   1. Which medications need to be refilled? (please list name of each medication and dose if known) eliquis  5mg  2. Which pharmacy/location (including street and city if local pharmacy) is medication to be sent to? Pt assistance program.  Fax presc to [phone] 3. Do they need a 30 day or 90 day supply?  90 day supply with 1 refill

## 2017-03-14 NOTE — Telephone Encounter (Signed)
Pt last saw Dr Marlou Porch 12/05/16, last labs 03/21/16 Creat 1.9, pt needs repeat CBC and BMP soon, labs almost 75 year old, age 77, weight 90.7kg, based on specified criteria pt is on appropriate dosage of Eliquis 5mg  BID.    Pt needs signed copy of Eliquis 5mg  rx signed by Dr Marlou Porch and faxed to [place] pt assistance program [phone].  Will forward message to Dr Marlou Porch nurse as well as print rx for Dr Marlou Porch to sign.    Rx and fax cover sheet given to [facility], RN with Dr Marlou Porch.  Asked her to Please fax rx to requested patient assistance program once signed.

## 2017-03-18 ENCOUNTER — Telehealth: Payer: Self-pay

## 2017-03-18 NOTE — Telephone Encounter (Signed)
The pt has been approved to receive Eliquis through [employer] pt assistancefoundation from 03/15/17 until 08/11/17.

## 2017-03-26 ENCOUNTER — Other Ambulatory Visit: Payer: Self-pay | Admitting: Pharmacist

## 2017-03-26 NOTE — Patient Outreach (Signed)
[facility]) Care Management  03/26/2017  [name] June 02, 1941 [id number]   [name] on the patient's behalf to check on the status of her application.  A note in the patient's chart from Dr. Marlou Porch' office stated patient had been approved until 08/11/17.  Call was placed today to verify delivery date.  [place] confirmed patient is approved through 08/11/17 but cannot receive the medication at this time because they have not received the new prescription from Dr. Marlou Porch' office.  An inbasket message was sent to Thomes Dinning at Dr. Marlou Porch' office.  Plan:  Follow up in 1-2 business days.   [name], PharmD, [place] Clinical Pharmacist [phone]

## 2017-03-27 ENCOUNTER — Telehealth: Payer: Self-pay | Admitting: Cardiology

## 2017-03-27 NOTE — Telephone Encounter (Signed)
New message     Forwarding this message to a nurse.  It was in my staff message basket.  Hello. My name is Denyse Amass and I am a Clinical Pharmacist with [facility]. I was wondering if you could help me with [name]. I have been working to get Eliquis from the [employer]. Ms. Cong has been approved but [employer] is still in need of a new prescription for Eliquis for a 90 day supply with 1 refill to be faxed to them at: [phone]. Although the patient is approved she cannot receive the medication at her home until the prescription is received.    I see some correspondace in the chart about the prescription from 03/14/17 and 03/24/17.    Blessings,   [name], PharmD, [place] Clinical Pharmacist  [phone]

## 2017-03-27 NOTE — Telephone Encounter (Signed)
Alwyn Ren, I have received your message and will complete the prescription for the [facility]. Dr Marlou Porch will not be back in the office until 04/07/2017.

## 2017-03-28 ENCOUNTER — Other Ambulatory Visit: Payer: Self-pay | Admitting: Pharmacist

## 2017-03-28 NOTE — Patient Outreach (Signed)
[facility]) Care Management  03/28/2017  [name] February 26, 1941 [id number]   Patient's chart was reviewed to see if there was a response from the phone call and messages sent about the patient's application and prescription for Eliquis.  Al Pimple, RN from Dr. Marlou Porch office responded that she will complete the prescription but that Dr. Marlou Porch will not be back into the office until 04/07/17.  Patient was called to inform her about the status of her application.  Unfortunately, she did not answer the phone. HIPAA compliant message left on the patient's voicemail.  Plan: Call patient/follow up with [place] in 7-10 business days.  [name], PharmD, [place] Clinical Pharmacist [phone]

## 2017-04-03 ENCOUNTER — Inpatient Hospital Stay (HOSPITAL_COMMUNITY)
Admission: EM | Admit: 2017-04-03 | Discharge: 2017-04-13 | DRG: 871 | Disposition: A | Discharge status: Skilled Nursing Facility | Payer: PPO | Attending: Internal Medicine | Admitting: Internal Medicine

## 2017-04-03 ENCOUNTER — Encounter (HOSPITAL_COMMUNITY): Payer: Self-pay | Admitting: *Deleted

## 2017-04-03 ENCOUNTER — Inpatient Hospital Stay (HOSPITAL_COMMUNITY): Payer: PPO

## 2017-04-03 ENCOUNTER — Emergency Department (HOSPITAL_COMMUNITY): Payer: PPO

## 2017-04-03 DIAGNOSIS — K219 Gastro-esophageal reflux disease without esophagitis: Secondary | ICD-10-CM | POA: Diagnosis not present

## 2017-04-03 DIAGNOSIS — N184 Chronic kidney disease, stage 4 (severe): Secondary | ICD-10-CM | POA: Diagnosis not present

## 2017-04-03 DIAGNOSIS — I252 Old myocardial infarction: Secondary | ICD-10-CM

## 2017-04-03 DIAGNOSIS — Z6839 Body mass index (BMI) 39.0-39.9, adult: Secondary | ICD-10-CM

## 2017-04-03 DIAGNOSIS — I129 Hypertensive chronic kidney disease with stage 1 through stage 4 chronic kidney disease, or unspecified chronic kidney disease: Secondary | ICD-10-CM | POA: Diagnosis not present

## 2017-04-03 DIAGNOSIS — E11319 Type 2 diabetes mellitus with unspecified diabetic retinopathy without macular edema: Secondary | ICD-10-CM | POA: Diagnosis present

## 2017-04-03 DIAGNOSIS — N183 Chronic kidney disease, stage 3 (moderate): Secondary | ICD-10-CM | POA: Diagnosis present

## 2017-04-03 DIAGNOSIS — L8990 Pressure ulcer of unspecified site, unspecified stage: Secondary | ICD-10-CM | POA: Insufficient documentation

## 2017-04-03 DIAGNOSIS — M6281 Muscle weakness (generalized): Secondary | ICD-10-CM | POA: Diagnosis not present

## 2017-04-03 DIAGNOSIS — D72829 Elevated white blood cell count, unspecified: Secondary | ICD-10-CM | POA: Diagnosis not present

## 2017-04-03 DIAGNOSIS — I2510 Atherosclerotic heart disease of native coronary artery without angina pectoris: Secondary | ICD-10-CM | POA: Diagnosis not present

## 2017-04-03 DIAGNOSIS — Z9842 Cataract extraction status, left eye: Secondary | ICD-10-CM

## 2017-04-03 DIAGNOSIS — B961 Klebsiella pneumoniae [K. pneumoniae] as the cause of diseases classified elsewhere: Secondary | ICD-10-CM | POA: Diagnosis not present

## 2017-04-03 DIAGNOSIS — C679 Malignant neoplasm of bladder, unspecified: Secondary | ICD-10-CM | POA: Diagnosis present

## 2017-04-03 DIAGNOSIS — L03116 Cellulitis of left lower limb: Secondary | ICD-10-CM | POA: Diagnosis not present

## 2017-04-03 DIAGNOSIS — A4189 Other specified sepsis: Secondary | ICD-10-CM | POA: Diagnosis not present

## 2017-04-03 DIAGNOSIS — I480 Paroxysmal atrial fibrillation: Secondary | ICD-10-CM | POA: Diagnosis not present

## 2017-04-03 DIAGNOSIS — A419 Sepsis, unspecified organism: Secondary | ICD-10-CM | POA: Diagnosis not present

## 2017-04-03 DIAGNOSIS — Z7189 Other specified counseling: Secondary | ICD-10-CM

## 2017-04-03 DIAGNOSIS — Z89611 Acquired absence of right leg above knee: Secondary | ICD-10-CM | POA: Diagnosis not present

## 2017-04-03 DIAGNOSIS — Z89511 Acquired absence of right leg below knee: Secondary | ICD-10-CM

## 2017-04-03 DIAGNOSIS — H3530 Unspecified macular degeneration: Secondary | ICD-10-CM | POA: Diagnosis present

## 2017-04-03 DIAGNOSIS — E1151 Type 2 diabetes mellitus with diabetic peripheral angiopathy without gangrene: Secondary | ICD-10-CM | POA: Diagnosis present

## 2017-04-03 DIAGNOSIS — H409 Unspecified glaucoma: Secondary | ICD-10-CM | POA: Diagnosis not present

## 2017-04-03 DIAGNOSIS — Z515 Encounter for palliative care: Secondary | ICD-10-CM | POA: Diagnosis not present

## 2017-04-03 DIAGNOSIS — N170 Acute kidney failure with tubular necrosis: Secondary | ICD-10-CM | POA: Diagnosis not present

## 2017-04-03 DIAGNOSIS — I10 Essential (primary) hypertension: Secondary | ICD-10-CM | POA: Diagnosis present

## 2017-04-03 DIAGNOSIS — N179 Acute kidney failure, unspecified: Secondary | ICD-10-CM | POA: Diagnosis present

## 2017-04-03 DIAGNOSIS — E1159 Type 2 diabetes mellitus with other circulatory complications: Secondary | ICD-10-CM | POA: Diagnosis present

## 2017-04-03 DIAGNOSIS — R2689 Other abnormalities of gait and mobility: Secondary | ICD-10-CM | POA: Diagnosis not present

## 2017-04-03 DIAGNOSIS — K5900 Constipation, unspecified: Secondary | ICD-10-CM | POA: Diagnosis present

## 2017-04-03 DIAGNOSIS — E860 Dehydration: Secondary | ICD-10-CM | POA: Diagnosis present

## 2017-04-03 DIAGNOSIS — N390 Urinary tract infection, site not specified: Secondary | ICD-10-CM | POA: Diagnosis not present

## 2017-04-03 DIAGNOSIS — I6789 Other cerebrovascular disease: Secondary | ICD-10-CM | POA: Diagnosis not present

## 2017-04-03 DIAGNOSIS — Z7901 Long term (current) use of anticoagulants: Secondary | ICD-10-CM

## 2017-04-03 DIAGNOSIS — Z89432 Acquired absence of left foot: Secondary | ICD-10-CM

## 2017-04-03 DIAGNOSIS — Z66 Do not resuscitate: Secondary | ICD-10-CM | POA: Diagnosis not present

## 2017-04-03 DIAGNOSIS — Z9841 Cataract extraction status, right eye: Secondary | ICD-10-CM

## 2017-04-03 DIAGNOSIS — Z8551 Personal history of malignant neoplasm of bladder: Secondary | ICD-10-CM | POA: Diagnosis not present

## 2017-04-03 DIAGNOSIS — E785 Hyperlipidemia, unspecified: Secondary | ICD-10-CM | POA: Diagnosis present

## 2017-04-03 DIAGNOSIS — Z794 Long term (current) use of insulin: Secondary | ICD-10-CM

## 2017-04-03 DIAGNOSIS — R251 Tremor, unspecified: Secondary | ICD-10-CM | POA: Diagnosis not present

## 2017-04-03 DIAGNOSIS — Z79899 Other long term (current) drug therapy: Secondary | ICD-10-CM

## 2017-04-03 DIAGNOSIS — E669 Obesity, unspecified: Secondary | ICD-10-CM | POA: Diagnosis present

## 2017-04-03 DIAGNOSIS — L89150 Pressure ulcer of sacral region, unstageable: Secondary | ICD-10-CM | POA: Diagnosis not present

## 2017-04-03 DIAGNOSIS — I7090 Unspecified atherosclerosis: Secondary | ICD-10-CM | POA: Diagnosis not present

## 2017-04-03 DIAGNOSIS — E1122 Type 2 diabetes mellitus with diabetic chronic kidney disease: Secondary | ICD-10-CM | POA: Diagnosis not present

## 2017-04-03 DIAGNOSIS — Z87891 Personal history of nicotine dependence: Secondary | ICD-10-CM

## 2017-04-03 DIAGNOSIS — M549 Dorsalgia, unspecified: Secondary | ICD-10-CM | POA: Diagnosis not present

## 2017-04-03 DIAGNOSIS — E8770 Fluid overload, unspecified: Secondary | ICD-10-CM | POA: Diagnosis present

## 2017-04-03 DIAGNOSIS — Z888 Allergy status to other drugs, medicaments and biological substances status: Secondary | ICD-10-CM

## 2017-04-03 DIAGNOSIS — I2583 Coronary atherosclerosis due to lipid rich plaque: Secondary | ICD-10-CM | POA: Diagnosis not present

## 2017-04-03 DIAGNOSIS — L97929 Non-pressure chronic ulcer of unspecified part of left lower leg with unspecified severity: Secondary | ICD-10-CM | POA: Diagnosis present

## 2017-04-03 DIAGNOSIS — Z936 Other artificial openings of urinary tract status: Secondary | ICD-10-CM

## 2017-04-03 DIAGNOSIS — E871 Hypo-osmolality and hyponatremia: Secondary | ICD-10-CM | POA: Diagnosis not present

## 2017-04-03 DIAGNOSIS — Z961 Presence of intraocular lens: Secondary | ICD-10-CM | POA: Diagnosis present

## 2017-04-03 DIAGNOSIS — E1142 Type 2 diabetes mellitus with diabetic polyneuropathy: Secondary | ICD-10-CM | POA: Diagnosis present

## 2017-04-03 DIAGNOSIS — Z955 Presence of coronary angioplasty implant and graft: Secondary | ICD-10-CM

## 2017-04-03 DIAGNOSIS — I959 Hypotension, unspecified: Secondary | ICD-10-CM | POA: Diagnosis not present

## 2017-04-03 DIAGNOSIS — E11622 Type 2 diabetes mellitus with other skin ulcer: Secondary | ICD-10-CM | POA: Diagnosis present

## 2017-04-03 DIAGNOSIS — D638 Anemia in other chronic diseases classified elsewhere: Secondary | ICD-10-CM | POA: Diagnosis present

## 2017-04-03 DIAGNOSIS — R531 Weakness: Secondary | ICD-10-CM | POA: Diagnosis not present

## 2017-04-03 DIAGNOSIS — N19 Unspecified kidney failure: Secondary | ICD-10-CM | POA: Diagnosis not present

## 2017-04-03 DIAGNOSIS — S37009A Unspecified injury of unspecified kidney, initial encounter: Secondary | ICD-10-CM

## 2017-04-03 DIAGNOSIS — I739 Peripheral vascular disease, unspecified: Secondary | ICD-10-CM | POA: Diagnosis not present

## 2017-04-03 DIAGNOSIS — N281 Cyst of kidney, acquired: Secondary | ICD-10-CM | POA: Diagnosis not present

## 2017-04-03 DIAGNOSIS — R278 Other lack of coordination: Secondary | ICD-10-CM | POA: Diagnosis not present

## 2017-04-03 DIAGNOSIS — R5383 Other fatigue: Secondary | ICD-10-CM | POA: Diagnosis not present

## 2017-04-03 LAB — CBG MONITORING, ED
GLUCOSE-CAPILLARY: 227 mg/dL — AB (ref 65–99)
Glucose-Capillary: 234 mg/dL — ABNORMAL HIGH (ref 65–99)

## 2017-04-03 LAB — CBC WITH DIFFERENTIAL/PLATELET
BASOS PCT: 0 %
Basophils Absolute: 0 10*3/uL (ref 0.0–0.1)
Eosinophils Absolute: 0 10*3/uL (ref 0.0–0.7)
Eosinophils Relative: 0 %
HCT: 30.3 % — ABNORMAL LOW (ref 36.0–46.0)
HEMOGLOBIN: 10 g/dL — AB (ref 12.0–15.0)
LYMPHS PCT: 2 %
Lymphs Abs: 0.5 10*3/uL — ABNORMAL LOW (ref 0.7–4.0)
MCH: 25.1 pg — ABNORMAL LOW (ref 26.0–34.0)
MCHC: 33 g/dL (ref 30.0–36.0)
MCV: 76.1 fL — AB (ref 78.0–100.0)
MONOS PCT: 3 %
Monocytes Absolute: 0.8 10*3/uL (ref 0.1–1.0)
NEUTROS PCT: 95 %
Neutro Abs: 25.5 10*3/uL — ABNORMAL HIGH (ref 1.7–7.7)
PLATELETS: 415 10*3/uL — AB (ref 150–400)
RBC: 3.98 MIL/uL (ref 3.87–5.11)
RDW: 16.6 % — AB (ref 11.5–15.5)
WBC: 26.8 10*3/uL — ABNORMAL HIGH (ref 4.0–10.5)

## 2017-04-03 LAB — I-STAT CG4 LACTIC ACID, ED
LACTIC ACID, VENOUS: 2.81 mmol/L — AB (ref 0.5–1.9)
LACTIC ACID, VENOUS: 1.64 mmol/L (ref 0.5–1.9)

## 2017-04-03 LAB — COMPREHENSIVE METABOLIC PANEL
ALT: 22 U/L (ref 14–54)
ANION GAP: 11 (ref 5–15)
AST: 27 U/L (ref 15–41)
Albumin: 2.4 g/dL — ABNORMAL LOW (ref 3.5–5.0)
Alkaline Phosphatase: 169 U/L — ABNORMAL HIGH (ref 38–126)
BUN: 88 mg/dL — ABNORMAL HIGH (ref 6–20)
CHLORIDE: 95 mmol/L — AB (ref 101–111)
CO2: 23 mmol/L (ref 22–32)
Calcium: 9.6 mg/dL (ref 8.9–10.3)
Creatinine, Ser: 4.09 mg/dL — ABNORMAL HIGH (ref 0.44–1.00)
GFR, EST AFRICAN AMERICAN: 11 mL/min — AB (ref >60)
GFR, EST NON AFRICAN AMERICAN: 10 mL/min — AB (ref >60)
Glucose, Bld: 234 mg/dL — ABNORMAL HIGH (ref 65–99)
POTASSIUM: 4.4 mmol/L (ref 3.5–5.1)
SODIUM: 129 mmol/L — AB (ref 135–145)
Total Bilirubin: 0.6 mg/dL (ref 0.3–1.2)
Total Protein: 7.8 g/dL (ref 6.5–8.1)

## 2017-04-03 MED ORDER — VANCOMYCIN HCL IN DEXTROSE 1-5 GM/200ML-% IV SOLN
1000.0000 mg | Freq: Once | INTRAVENOUS | Status: AC
  Administered 2017-04-03: 1000 mg via INTRAVENOUS
  Filled 2017-04-03: qty 200

## 2017-04-03 MED ORDER — SODIUM CHLORIDE 0.9 % IV SOLN
INTRAVENOUS | Status: DC
  Administered 2017-04-03 – 2017-04-04 (×3): via INTRAVENOUS

## 2017-04-03 MED ORDER — PIPERACILLIN-TAZOBACTAM 3.375 G IVPB 30 MIN
3.3750 g | Freq: Once | INTRAVENOUS | Status: AC
Start: 2017-04-03 — End: 2017-04-03
  Administered 2017-04-03: 3.375 g via INTRAVENOUS
  Filled 2017-04-03: qty 50

## 2017-04-03 MED ORDER — ATORVASTATIN CALCIUM 80 MG PO TABS
80.0000 mg | ORAL_TABLET | Freq: Every day | ORAL | Status: DC
  Administered 2017-04-04 – 2017-04-12 (×9): 80 mg via ORAL
  Filled 2017-04-03 (×2): qty 1
  Filled 2017-04-03: qty 2
  Filled 2017-04-03: qty 1
  Filled 2017-04-03 (×2): qty 2
  Filled 2017-04-03 (×3): qty 1
  Filled 2017-04-03: qty 2
  Filled 2017-04-03: qty 1
  Filled 2017-04-03 (×3): qty 2
  Filled 2017-04-03: qty 1
  Filled 2017-04-03 (×2): qty 2
  Filled 2017-04-03 (×2): qty 1

## 2017-04-03 MED ORDER — SODIUM CHLORIDE 0.9 % IV BOLUS (SEPSIS)
1000.0000 mL | Freq: Once | INTRAVENOUS | Status: AC
  Administered 2017-04-03: 1000 mL via INTRAVENOUS

## 2017-04-03 MED ORDER — APIXABAN 5 MG PO TABS: 5.0000 mg | ORAL_TABLET | Freq: Two times a day (BID) | ORAL | Status: DC

## 2017-04-03 MED ORDER — INSULIN GLARGINE 100 UNIT/ML ~~LOC~~ SOLN
10.0000 [IU] | Freq: Every day | SUBCUTANEOUS | Status: DC
  Administered 2017-04-03 – 2017-04-10 (×8): 10 [IU] via SUBCUTANEOUS
  Filled 2017-04-03 (×9): qty 0.1

## 2017-04-03 MED ORDER — PIPERACILLIN-TAZOBACTAM IN DEX 2-0.25 GM/50ML IV SOLN
2.2500 g | Freq: Three times a day (TID) | INTRAVENOUS | Status: DC
  Administered 2017-04-03 – 2017-04-04 (×2): 2.25 g via INTRAVENOUS
  Filled 2017-04-03 (×3): qty 50

## 2017-04-03 MED ORDER — SODIUM BICARBONATE 650 MG PO TABS
1300.0000 mg | ORAL_TABLET | Freq: Two times a day (BID) | ORAL | Status: DC
Start: 2017-04-03 — End: 2017-04-13
  Administered 2017-04-03 – 2017-04-13 (×20): 1300 mg via ORAL
  Filled 2017-04-03 (×20): qty 2

## 2017-04-03 MED ORDER — SENNA 8.6 MG PO TABS: 2.0000 | ORAL_TABLET | Freq: Every evening | ORAL | Status: DC | PRN

## 2017-04-03 MED ORDER — PANTOPRAZOLE SODIUM 40 MG PO TBEC
40.0000 mg | DELAYED_RELEASE_TABLET | Freq: Every day | ORAL | Status: DC
  Administered 2017-04-03 – 2017-04-13 (×11): 40 mg via ORAL
  Filled 2017-04-03 (×12): qty 1

## 2017-04-03 MED ORDER — LORAZEPAM 2 MG/ML IJ SOLN
0.5000 mg | Freq: Once | INTRAMUSCULAR | Status: AC
  Administered 2017-04-03: 0.5 mg via INTRAVENOUS
  Filled 2017-04-03: qty 1

## 2017-04-03 MED ORDER — NITROGLYCERIN 0.3 MG SL SUBL: 0.6000 mg | SUBLINGUAL_TABLET | SUBLINGUAL | Status: DC | PRN

## 2017-04-03 MED ORDER — ONDANSETRON HCL 4 MG/2ML IJ SOLN
4.0000 mg | Freq: Four times a day (QID) | INTRAMUSCULAR | Status: DC | PRN
  Administered 2017-04-03 – 2017-04-10 (×2): 4 mg via INTRAVENOUS
  Filled 2017-04-03 (×2): qty 2

## 2017-04-03 MED ORDER — INSULIN ASPART 100 UNIT/ML ~~LOC~~ SOLN
0.0000 [IU] | Freq: Three times a day (TID) | SUBCUTANEOUS | Status: DC
  Administered 2017-04-04: 1 [IU] via SUBCUTANEOUS
  Administered 2017-04-04: 3 [IU] via SUBCUTANEOUS
  Administered 2017-04-04 – 2017-04-05 (×3): 2 [IU] via SUBCUTANEOUS
  Administered 2017-04-05 – 2017-04-06 (×2): 1 [IU] via SUBCUTANEOUS
  Administered 2017-04-07: 2 [IU] via SUBCUTANEOUS
  Administered 2017-04-07: 1 [IU] via SUBCUTANEOUS
  Administered 2017-04-08: 3 [IU] via SUBCUTANEOUS
  Administered 2017-04-08: 5 [IU] via SUBCUTANEOUS
  Administered 2017-04-08 – 2017-04-09 (×4): 3 [IU] via SUBCUTANEOUS
  Administered 2017-04-10: 2 [IU] via SUBCUTANEOUS

## 2017-04-03 MED ORDER — HEPARIN SODIUM (PORCINE) 5000 UNIT/ML IJ SOLN
5000.0000 [IU] | Freq: Three times a day (TID) | INTRAMUSCULAR | Status: DC
  Administered 2017-04-03 – 2017-04-13 (×28): 5000 [IU] via SUBCUTANEOUS
  Filled 2017-04-03 (×29): qty 1

## 2017-04-03 MED ORDER — POLYETHYLENE GLYCOL 3350 17 G PO PACK: 17.0000 g | PACK | Freq: Every evening | ORAL | Status: DC | PRN

## 2017-04-03 MED ORDER — ACETAMINOPHEN 500 MG PO TABS
1000.0000 mg | ORAL_TABLET | Freq: Two times a day (BID) | ORAL | Status: DC | PRN
  Administered 2017-04-03 – 2017-04-05 (×4): 1000 mg via ORAL
  Filled 2017-04-03 (×4): qty 2

## 2017-04-03 MED ORDER — AMIODARONE HCL 200 MG PO TABS
200.0000 mg | ORAL_TABLET | Freq: Every day | ORAL | Status: DC
Start: 2017-04-03 — End: 2017-04-13
  Administered 2017-04-03 – 2017-04-12 (×10): 200 mg via ORAL
  Filled 2017-04-03 (×10): qty 1

## 2017-04-03 NOTE — H&P (Addendum)
History and Physical  TRIVA HUEBER YQM:[id number] DOB: 06/07/1941 DOA: 04/03/2017  PCP:  Leighton Ruff, MD   Chief Complaint:  Fatigue Poor appetite   History of Present Illness:  Pt is a 76 yo female with hx of bladder cancer s/p bladder removal / total hystrectomy with nephrostomy bag placed 4 years ago complicated with UTI only once before, comes today with cc of fatigue, poor appetite, nausea for the past 2-3 days with mild epigastric discomfort w/o fever/chills. No diarrhea/constipation. No cough/chest pain/dyspnea. No other complaints. No open wounds.   Review of Systems:  CONSTITUTIONAL:     No night sweats.  +fatigue.  No fever. No chills. Eyes:                            No visual changes.  No eye pain.  No eye discharge.   ENT:                              No epistaxis.  No sinus pain.  No sore throat.   No congestion. RESPIRATORY:           No cough.  No wheeze.  No hemoptysis.  No dyspnea CARDIOVASCULAR   :  No chest pains.  No palpitations. GASTROINTESTINAL:  +abdominal pain.  +nausea. No vomiting.  No diarrhea. No   constipation.  No hematemesis.  No hematochezia.  No melena. GENITOURINARY:      No urgency.  No frequency.  No dysuria.  No hematuria.  No   obstructive symptoms.  No discharge.  No pain.  MUSCULOSKELETAL:  No musculoskeletal pain.  No joint swelling.  No arthritis. NEUROLOGICAL:        No confusion.  No weakness. No headache. No seizure. PSYCHIATRIC:             No depression. No anxiety. No suicidal ideation. SKIN:                             No rashes.  No lesions.  No wounds. ENDOCRINE:                No weight loss.  No polydipsia.  No polyuria.  No polyphagia. HEMATOLOGIC:           No purpura.  No petechiae.  No bleeding.  ALLERGIC                 : No pruritus.  No angioedema Other:  Past Medical and Surgical History:   Past Medical History:  Diagnosis Date  . Anemia   . Blood transfusion   . CAD (coronary artery disease)  01/26/2013   Evidently circumflex stent in 2014 following bladder cancer surgery for old myocardial infarction.   . CKD (chronic kidney disease) stage 3, GFR 30-59 ml/min 01/26/2013  . GERD (gastroesophageal reflux disease)   . Glaucoma   . History of atrial tachycardia   . History of bladder cancer TCC AND CIS  . Hyperlipidemia   . Hypertension   . Macular degeneration of both eyes   . Myocardial infarction ([place])   . Obesity (BMI 30-39.9)   . Peripheral neuropathy LEGS AND HANDS  . Peripheral vascular disease (HCC) S/P RIGHT BKA  . Presence of urostomy ([place])   . Retinopathy due to secondary diabetes mellitus ([place])   . S/P BKA (below knee amputation) unilateral (  [place]) RIGHT  . Type 2 diabetes mellitus with vascular disease [facility])    Past Surgical History:  Procedure Laterality Date  . ABDOMINAL ANGIOGRAM N/A 09/08/2014   Procedure: ABDOMINAL ANGIOGRAM;  Surgeon: Conrad Cooter, MD;  Location: [facility] CATH LAB;  Service: Cardiovascular;  Laterality: N/A;  . ABDOMINAL HYSTERECTOMY    . AMPUTATION Left 09/14/2014   Procedure: LEFT FOOT FIRST RAY AMPUTATION;  Surgeon: Mcarthur Rossetti, MD;  Location: [place];  Service: Orthopedics;  Laterality: Left;  . APPENDECTOMY  1963  . [place]  . BELOW KNEE LEG AMPUTATION  05-02-2007   RIGHT  . CATARACT EXTRACTION W/ INTRAOCULAR LENS  IMPLANT, BILATERAL    . CORONARY STENT PLACEMENT    . CYSTO / RESECTION BLADDER BX'S  04-01-11  &  11-26-10  . CYSTOSCOPY WITH BIOPSY  10/14/2011   Procedure: CYSTOSCOPY WITH BIOPSY;  Surgeon: Claybon Jabs, MD;  Location: [facility];  Service: Urology;  Laterality: N/A;  BLADDER BIOPSY  . FEMORAL-POPLITEAL BYPASS GRAFT Left 09/12/2014   Procedure:  LEFT FEMORAL-POPLITEAL ARTERY BYPASS GRAFT USING NON REVERSE LEFT GREATER SAPHENOUS VEIN;  Surgeon: Rosetta Posner, MD;  Location: [place];  Service: Vascular;  Laterality: Left;  . I & D RIGHT BELOW KNEE AMPUTATION WOUND  06-10-2011  .  LAPAROSCOPIC CHOLECYSTECTOMY  12-10-1999  . REVISION UROSTOMY CUTANEOUS    . REVISION UROSTOMY CUTANEOUS    . RIGHT FOOT I & D / REMOVAL NECROTIC BONE  JUN  &  AUG 2008  . RIGHT GREAT TOE AMPUTATION  11-05-2006    Social History:   reports that she quit smoking about 41 years ago. Her smoking use included Cigarettes. She has a 34.00 pack-year smoking history. She has never used smokeless tobacco. She reports that she drinks alcohol. She reports that she does not use drugs.    Allergies  Allergen Reactions  . Gabapentin Other (See Comments)    Extremely drunk and lethargic  . Lisinopril Other (See Comments)    Increased creatinine levels (pt currently taking low dose)  . Metformin And Related Other (See Comments)    Increased creatinine levels  . Biaxin [Clarithromycin] Itching    Family History  Problem Relation Age of Onset  . Heart failure Mother        died age 21  . Sudden death Father 61  . Heart disease Father   . Hypertension Father   . Heart attack Father   . Diabetes Brother   . Heart attack Brother   . AAA (abdominal aortic aneurysm) Brother   . Heart disease Brother       Prior to Admission medications   Medication Sig Start Date End Date Taking? Authorizing Provider  acetaminophen (TYLENOL) 500 MG tablet Take 1,000 mg by mouth 2 (two) times daily as needed for mild pain or fever.    Yes [provider]  amiodarone (PACERONE) 200 MG tablet Take 1 tablet by mouth daily Patient taking differently: Take 200 mg by mouth at bedtime.  12/30/16  Yes Jerline Pain, MD  atorvastatin (LIPITOR) 80 MG tablet TAKE 1 TABLET (80 MG TOTAL) BY MOUTH EVERY EVENING. 05/31/16  Yes Jerline Pain, MD  calcitRIOL (ROCALTROL) 0.25 MCG capsule Take 0.25 mcg by mouth at bedtime.  08/03/16  Yes Deterding, Jeneen Rinks, MD  ferrous sulfate 325 (65 FE) MG tablet Take 325 mg by mouth at bedtime.   Yes [provider]  furosemide (LASIX)  40 MG tablet Take 80 mg by mouth 2 (two)  times daily.    Yes [provider]  isosorbide mononitrate (IMDUR) 30 MG 24 hr tablet TAKE ONE TABLET BY MOUTH DAILY **MUST CALL MD AT [phone] FOR APPOINTMENT Patient taking differently: TAKE ONE TABLET BY MOUTH in the evening **MUST CALL MD AT [phone] FOR APPOINTMENT 12/30/16  Yes Jerline Pain, MD  LANTUS SOLOSTAR 100 UNIT/ML Solostar Pen Inject 0-65 Units into the skin 2 (two) times daily. Dose depends on sliding scale. 01/24/17  Yes [provider]  Magnesium 400 MG TABS Take 400 mg by mouth at bedtime.    Yes [provider]  metoprolol succinate (TOPROL-XL) 100 MG 24 hr tablet TAKE 1 TABLET (100 MG TOTAL) BY MOUTH EVERY EVENING. TAKE WITH OR IMMEDIATELY FOLLOWING A MEAL. 02/17/17  Yes Jerline Pain, MD  Multiple Vitamins-Minerals (PRESERVISION AREDS PO) Take 1 capsule by mouth 2 (two) times daily.   Yes [provider]  nitroGLYCERIN (NITROSTAT) 0.6 MG SL tablet Take 0.6 mg by mouth every 5 (five) minutes x 3 doses as needed.   Yes [provider]  NOVOLOG FLEXPEN 100 UNIT/ML FlexPen Inject 0-20 Units into the skin 2 (two) times daily before a meal. Use per sliding scale  08/23/16  Yes Leighton Ruff, MD  omeprazole (PRILOSEC) 20 MG capsule Take 20 mg by mouth every evening.    Yes [provider]  polyethylene glycol (MIRALAX / GLYCOLAX) packet Take 17 g by mouth at bedtime as needed for mild constipation.    Yes [provider]  potassium chloride SA (K-DUR,KLOR-CON) 20 MEQ tablet Take 20 mEq by mouth at bedtime.  08/16/13  Yes [provider]  senna (SENOKOT) 8.6 MG TABS tablet Take 2 tablets by mouth at bedtime as needed for mild constipation (([place] [place])).    Yes [provider]  sodium bicarbonate 650 MG tablet Take 1,300 mg by mouth 2 (two) times daily.   Yes [provider]  apixaban (ELIQUIS) 5 MG TABS tablet Take 1 tablet (5 mg total) by mouth 2 (two) times daily. Patient not  taking: Reported on 04/03/2017 03/14/17   Jerline Pain, MD    Physical Exam: BP (!) 96/42   Pulse 67   Temp 99.4 F (37.4 C) (Rectal)   Resp 17   SpO2 100%   GENERAL :   Alert and cooperative, and appears to be in no acute distress. HEAD:           normocephalic. EYES:            PERRL, EOMI.  vision is grossly intact.   NECK:          supple CARDIAC:    Normal S1 and S2. No gallop. No murmurs.  Vascular:     Mild left leg edema LUNGS:       Clear to auscultation  ABDOMEN: Positive bowel sounds. Soft, nondistended, mild tender diffusely to palpation. No guarding or rebound.      MSK:           No joint erythema or tenderness.  S/p R.BKA. L.big toe amputated with edematous lower leg in left with stage I ulcers.   Neuro        : Alert, oriented to person, place, and time.  PSYCH:       No hallucination. Patient is not suicidal.          Labs on Admission:  Reviewed.   Radiological Exams on  Admission: Dg Chest Port 1 View  Result Date: 04/03/2017 CLINICAL DATA:  Weakness for 3 days. EXAM: PORTABLE CHEST 1 VIEW COMPARISON:  Single-view of the chest 11/22/2015. CT chest 11/15/2015. PA and lateral chest 11/14/2015. FINDINGS: Mild, chronic scar in the right base is unchanged. Lungs otherwise clear. Heart size is normal. No pneumothorax or pleural fluid. Aortic atherosclerosis is noted. No acute bony abnormality. IMPRESSION: No acute disease. Atherosclerosis. Electronically Signed   By: Inge Rise M.D.   On: 04/03/2017 16:14     Assessment/Plan  Sepsis:  Unclear source for now but suspected urine : UA /Ucx ordered but pt not making urine now, emptied her bag within last two hours.  Continue zosyn , got van in ED ; will hold on continuing due to AKI w/o identifiable skin/soft tissue source other than urine for now.  bcx sent S/p 3L of NS, continue IVF  AKI on CKD:  Likely prerenal  Continue IVF Will check renal US Consult to nephrology  DMII: keep on SS insulin for  now. Keep on lantus 10 u QHS  Hx of Afib: pt not taking Eliquis due to hematuria that is not existing for the past two weeks. She wants to discuss restarting it with her cardiologist. She is aware of increasing risk of stroke. Continue rate control with BB.keep on Tele  HTN: hold medications for now.      Input & Output: ordered  Lines & Tubes: PIV DVT prophylaxis: [place] hep GI prophylaxis: PPI Consultants: nephrology Code Status: full Family Communication: at bedside Disposition Plan: TBD    Gennaro Africa M.D [facility]

## 2017-04-03 NOTE — Care Management Note (Signed)
Case Management Note  Patient Details  Name: [name] MRN: [id number] Date of Birth: 13-Jan-1941  Subjective/Objective:                  Sepsis  Action/Plan: ED CM spoke with the patient at the bedside along with her close friend Arbie Cookey. Patient states she lives at home with her daughter who is also an amputee. She reports she sat on the toilet yesterday for 30 minutes after having a bowel movement and cleaning herself because she could not "get her legs to work." She reports needing a BSC.  Patient states she has a prosthesis which has been denied by her insurance. She has a bill for $5000. Reports she has been in contact with her health plan but this has not been resolved. CM encouraged her to notify her orthopedic surgeon about the denial and to request assistance with appealing. She and her friend plan to follow-up with her surgeon once she is discharged. She states she did not think to notify him. Arbie Cookey states she thinks home health would be helpful for the patient. CM provided the patient with a list of home health agencies. CM will continue to follow for discharge needs.   Expected Discharge Date:   (unknown)               Expected Discharge Plan:  [place]  In-House Referral:     Discharge planning Services  CM Consult  Post Acute Care Choice:    Choice offered to:  Patient  DME Arranged:    DME Agency:     HH Arranged:    [place] Agency:     Status of Service:  In process, will continue to follow  If discussed at Long Length of Stay Meetings, dates discussed:    Additional Comments:  Apolonio Schneiders, RN 04/03/2017, 7:11 PM

## 2017-04-03 NOTE — Progress Notes (Signed)
  Pharmacy Antibiotic Note  [name] is a 76 y.o. female admitted on 04/03/2017 with sepsis.  Pharmacy has been consulted for Zosyn dosing. Severe AKI noted on admission  Plan:  Zosyn 2.25 g IV q8 hr  F/u SCr     Temp (24hrs), Avg:99.2 F (37.3 C), Min:99 F (37.2 C), Max:99.4 F (37.4 C)   Recent Labs Lab 04/03/17 1627 04/03/17 1644 04/03/17 1905  WBC 26.8*  --   --   CREATININE 4.09*  --   --   LATICACIDVEN  --  2.81* 1.64    CrCl cannot be calculated (Unknown ideal weight.).    Allergies  Allergen Reactions  . Gabapentin Other (See Comments)    Extremely drunk and lethargic  . Lisinopril Other (See Comments)    Increased creatinine levels (pt currently taking low dose)  . Metformin And Related Other (See Comments)    Increased creatinine levels  . Biaxin [Clarithromycin] Itching    Antimicrobials this admission: 8/23 Vanc x 1 8/23 Zosyn >>   Dose adjustments this admission: ---  Microbiology results: 8/23 BCx: sent 8/23 UCx: sent    Thank you for allowing pharmacy to be a part of this patient's care.  Reuel Boom, PharmD, BCPS Pager: [phone] 04/03/2017, 7:10 PM

## 2017-04-03 NOTE — ED Provider Notes (Signed)
  Face-to-face evaluation   History: She presents for evaluation general achiness, with suspected fever characterized by temperature "99."  She denies cough.  She feels that her urinary output per her ostomy is "darker than usual.  Today she was weaker than usual and was unable to help with transfer to a wheelchair, which typically she can do.  She lives with her daughter who was recently hospitalized.  She is here today with a friend who helps her around the house.  She does not currently have any in-home health care.  Physical exam: Obese elderly female.  Mucous membranes are somewhat dry.  Heart regular rate and rhythm no murmur, lungs clear anteriorly.  Abdomen soft and nontender.  Medical screening examination/treatment/procedure(s) were conducted as a shared visit with non-physician practitioner(s) and myself.  I personally evaluated the patient during the encounter    Daleen Bo, MD 04/04/17 [phone]

## 2017-04-03 NOTE — ED Provider Notes (Signed)
[facility] DEPT Provider Note   CSN: [id number] Arrival date & time: 04/03/17  1520     History   Chief Complaint Chief Complaint  Patient presents with  . Weakness    HPI [name] is a 76 y.o. female with a past medical history of anemia, CAD, insulin-dependent diabetes, bladder cancer, obesity, left lower extremity leg wounds, right BKA, MI, A. fib on chronic anticoagulation, who presents emergency Department with chief complaint of fatigue and malaise. Patient states that she began having decreased appetite. 3 days ago. She has barely been able to eat. She states that she was only able to hold on a small bowel of oatmeal over the past 3 days and has had no appetite. Patient states that she aches everywhere, feels dehydrated and has been too weak to stand up on her own today. Notably, her blood pressure is low and she states that this is not normal for her. She has a history of urinary tract infections. Patient states that she did take 50 units of insulin prior to arrival, but has not eaten today. She denies chest pain, shortness of breath, cough. The wounds of her left lower extremity are being followed by the wound care center and have improved greatly over time. Patient states that she was having bleeding from her urostomy and quit taking her liquids about 3 weeks ago of her own accord. She states she has chronic black stool secondary to iron use. She's had no changes in her stool.  HPI  Past Medical History:  Diagnosis Date  . Anemia   . Blood transfusion   . CAD (coronary artery disease) 01/26/2013   Evidently circumflex stent in 2014 following bladder cancer surgery for old myocardial infarction.   . CKD (chronic kidney disease) stage 3, GFR 30-59 ml/min 01/26/2013  . GERD (gastroesophageal reflux disease)   . Glaucoma   . History of atrial tachycardia   . History of bladder cancer TCC AND CIS  . Hyperlipidemia   . Hypertension   . Macular degeneration of both  eyes   . Myocardial infarction ([place])   . Obesity (BMI 30-39.9)   . Peripheral neuropathy LEGS AND HANDS  . Peripheral vascular disease (HCC) S/P RIGHT BKA  . 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 Retinopathy due to secondary diabetes mellitus ([place])   . 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 Type 2 diabetes mellitus with vascular disease [facility])     Patient Active Problem List   Diagnosis Date Noted  . Chronic anticoagulation 12/19/2015  . Controlled type 2 diabetes mellitus with diabetic nephropathy, with long-term current use of insulin ([place])   . New onset atrial fibrillation ([place])   . Sinus bradycardia   . Acute respiratory failure with hypoxia ([place])   . Cardiogenic shock ([place])   . Paroxysmal atrial fibrillation ([place]) 11/19/2015  . Atrial fibrillation with RVR ([place]) 11/18/2015  . AKI (acute kidney injury) ([place]) 11/18/2015  . Metabolic encephalopathy 62/95/2841  . Acute hyperkalemia 11/18/2015  . Abnormal urinalysis 11/18/2015  . SIRS (systemic inflammatory response syndrome) ([place]) 11/18/2015  . Hyperosmolar non-ketotic state in patient with type 2 diabetes mellitus ([place]) 11/18/2015  . Hyperkalemia   . Arterial hypotension   . Anemia 09/17/2014  . 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 Peripheral vascular disease ([place]) 08/24/2013  . Status post above knee amputation of right lower extremity ([place]) 08/24/2013  . Obesity (BMI 30-39.9)   . Dehydration 01/27/2013  . CAD (coronary artery disease) 01/26/2013  .  CKD (chronic kidney disease) stage 3, GFR 30-59 ml/min 01/26/2013  . Type 2 diabetes mellitus with vascular disease ([place])   . HTN (hypertension) 11/14/2012  . History of bladder cancer     Past Surgical History:  Procedure Laterality Date  . ABDOMINAL ANGIOGRAM N/A 09/08/2014   Procedure: ABDOMINAL ANGIOGRAM;  Surgeon: Conrad [place], MD;  Location: [facility] CATH LAB;  Service: Cardiovascular;  Laterality: N/A;  . ABDOMINAL HYSTERECTOMY    . AMPUTATION Left 09/14/2014     Procedure: LEFT FOOT FIRST RAY AMPUTATION;  Surgeon: Mcarthur Rossetti, MD;  Location: [facility];  Service: Orthopedics;  Laterality: Left;  . APPENDECTOMY  1963  . [place]  . BELOW KNEE LEG AMPUTATION  05-02-2007   RIGHT  . CATARACT EXTRACTION W/ INTRAOCULAR LENS  IMPLANT, BILATERAL    . CORONARY STENT PLACEMENT    . CYSTO / RESECTION BLADDER BX'S  04-01-11  &  11-26-10  . CYSTOSCOPY WITH BIOPSY  10/14/2011   Procedure: CYSTOSCOPY WITH BIOPSY;  Surgeon: Claybon Jabs, MD;  Location: [facility];  Service: Urology;  Laterality: N/A;  BLADDER BIOPSY  . FEMORAL-POPLITEAL BYPASS GRAFT Left 09/12/2014   Procedure:  LEFT FEMORAL-POPLITEAL ARTERY BYPASS GRAFT USING NON REVERSE LEFT GREATER SAPHENOUS VEIN;  Surgeon: Rosetta Posner, MD;  Location: [facility];  Service: Vascular;  Laterality: Left;  . I & D RIGHT BELOW KNEE AMPUTATION WOUND  06-10-2011  . LAPAROSCOPIC CHOLECYSTECTOMY  12-10-1999  . REVISION UROSTOMY CUTANEOUS    . REVISION UROSTOMY CUTANEOUS    . RIGHT FOOT I & D / REMOVAL NECROTIC BONE  JUN  &  AUG 2008  . RIGHT GREAT TOE AMPUTATION  11-05-2006    OB History    No data available       Home Medications    Prior to Admission medications   Medication Sig Start Date End Date Taking? Authorizing Provider  acetaminophen (TYLENOL) 500 MG tablet Take 1,000 mg by mouth 2 (two) times daily as needed for mild pain or fever.    Yes [provider]  amiodarone (PACERONE) 200 MG tablet Take 1 tablet by mouth daily Patient taking differently: Take 200 mg by mouth at bedtime.  12/30/16  Yes Jerline Pain, MD  atorvastatin (LIPITOR) 80 MG tablet TAKE 1 TABLET (80 MG TOTAL) BY MOUTH EVERY EVENING. 05/31/16  Yes Jerline Pain, MD  calcitRIOL (ROCALTROL) 0.25 MCG capsule Take 0.25 mcg by mouth at bedtime.  08/03/16  Yes Deterding, Jeneen Rinks, MD  ferrous sulfate 325 (65 FE) MG tablet Take 325 mg by mouth at bedtime.   Yes [provider]   furosemide (LASIX) 40 MG tablet Take 80 mg by mouth 2 (two) times daily.    Yes [provider]  isosorbide mononitrate (IMDUR) 30 MG 24 hr tablet TAKE ONE TABLET BY MOUTH DAILY **MUST CALL MD AT [phone] FOR APPOINTMENT Patient taking differently: TAKE ONE TABLET BY MOUTH in the evening **MUST CALL MD AT [phone] FOR APPOINTMENT 12/30/16  Yes Jerline Pain, MD  LANTUS SOLOSTAR 100 UNIT/ML Solostar Pen Inject 0-65 Units into the skin 2 (two) times daily. Dose depends on sliding scale. 01/24/17  Yes [provider]  Magnesium 400 MG TABS Take 400 mg by mouth at bedtime.    Yes [provider]  metoprolol succinate (TOPROL-XL) 100 MG 24 hr tablet TAKE 1 TABLET (100 MG TOTAL) BY MOUTH EVERY EVENING. TAKE WITH OR IMMEDIATELY FOLLOWING A  MEAL. 02/17/17  Yes Jerline Pain, MD  Multiple Vitamins-Minerals (PRESERVISION AREDS PO) Take 1 capsule by mouth 2 (two) times daily.   Yes [provider]  nitroGLYCERIN (NITROSTAT) 0.6 MG SL tablet Take 0.6 mg by mouth every 5 (five) minutes x 3 doses as needed.   Yes [provider]  NOVOLOG FLEXPEN 100 UNIT/ML FlexPen Inject 0-20 Units into the skin 2 (two) times daily before a meal. Use per sliding scale  08/23/16  Yes Leighton Ruff, MD  omeprazole (PRILOSEC) 20 MG capsule Take 20 mg by mouth every evening.    Yes [provider]  polyethylene glycol (MIRALAX / GLYCOLAX) packet Take 17 g by mouth at bedtime as needed for mild constipation.    Yes [provider]  potassium chloride SA (K-DUR,KLOR-CON) 20 MEQ tablet Take 20 mEq by mouth at bedtime.  08/16/13  Yes [provider]  senna (SENOKOT) 8.6 MG TABS tablet Take 2 tablets by mouth at bedtime as needed for mild constipation (([employer])).    Yes [provider]  sodium bicarbonate 650 MG tablet Take 1,300 mg by mouth 2 (two) times daily.   Yes [provider]  apixaban (ELIQUIS) 5 MG TABS tablet Take 1 tablet  (5 mg total) by mouth 2 (two) times daily. Patient not taking: Reported on 04/03/2017 03/14/17   Jerline Pain, MD    Family History Family History  Problem Relation Age of Onset  . Heart failure Mother        died age 63  . Sudden death Father 4  . Heart disease Father   . Hypertension Father   . Heart attack Father   . Diabetes Brother   . Heart attack Brother   . AAA (abdominal aortic aneurysm) Brother   . Heart disease Brother     Social History Social History  Substance Use Topics  . Smoking status: Former Smoker    Packs/day: 2.00    Years: 17.00    Types: Cigarettes    Quit date: 10/09/1975  . Smokeless tobacco: Never Used  . Alcohol use 0.0 oz/week     Comment: RARE     Allergies   Gabapentin; Lisinopril; Metformin and related; and Biaxin [clarithromycin]   Review of Systems Review of Systems  Ten systems reviewed and are negative for acute change, except as noted in the HPI.   Physical Exam Updated Vital Signs BP 101/73   Pulse 70   Temp 99 F (37.2 C) (Oral)   Resp 17   SpO2 100%   Physical Exam  Constitutional: She is oriented to person, place, and time. She appears well-developed and well-nourished. No distress.  Appears ill, appears pale  HENT:  Head: Normocephalic and atraumatic.  Eyes: Pupils are equal, round, and reactive to light. Conjunctivae and EOM are normal. No scleral icterus.  Neck: Normal range of motion.  Cardiovascular: Normal rate, regular rhythm and normal heart sounds.  Exam reveals no gallop and no friction rub.   No murmur heard. Pulmonary/Chest: Effort normal and breath sounds normal. No respiratory distress.  Abdominal: Soft. Bowel sounds are normal. She exhibits no distension and no mass. There is tenderness. There is no guarding.  Urostomy in place. There appears to be bloody discharge in the urostomy bag. Generalized abdominal tenderness.   Neurological: She is alert and oriented to person, place, and time.  Mentation  is slowed  Skin: Skin is warm and dry. She is not diaphoretic.  Psychiatric: Her behavior is normal.  Nursing note and vitals reviewed.    ED Treatments / Results  Labs (all labs ordered are listed, but only abnormal results are displayed) Labs Reviewed  COMPREHENSIVE METABOLIC PANEL - Abnormal; Notable for the following:       Result Value   Sodium 129 (*)    Chloride 95 (*)    Glucose, Bld 234 (*)    BUN 88 (*)    Creatinine, Ser 4.09 (*)    Albumin 2.4 (*)    Alkaline Phosphatase 169 (*)    GFR calc non Af Amer 10 (*)    GFR calc Af Amer 11 (*)    All other components within normal limits  CBC WITH DIFFERENTIAL/PLATELET - Abnormal; Notable for the following:    WBC 26.8 (*)    Hemoglobin 10.0 (*)    HCT 30.3 (*)    MCV 76.1 (*)    MCH 25.1 (*)    RDW 16.6 (*)    Platelets 415 (*)    Neutro Abs 25.5 (*)    Lymphs Abs 0.5 (*)    All other components within normal limits  I-STAT CG4 LACTIC ACID, ED - Abnormal; Notable for the following:    Lactic Acid, Venous 2.81 (*)    All other components within normal limits  CBG MONITORING, ED - Abnormal; Notable for the following:    Glucose-Capillary 227 (*)    All other components within normal limits  CULTURE, BLOOD (ROUTINE X 2)  CULTURE, BLOOD (ROUTINE X 2)  URINALYSIS, ROUTINE W REFLEX MICROSCOPIC  I-STAT CG4 LACTIC ACID, ED  CBG MONITORING, ED    EKG  EKG Interpretation  Date/Time:  Thursday April 03 2017 16:08:08 EDT Ventricular Rate:  69 PR Interval:    QRS Duration: 107 QT Interval:  465 QTC Calculation: 499 R Axis:   62 Text Interpretation:  Sinus rhythm Borderline prolonged QT interval Since last tracing rate faster and QT has lengthened Confirmed by Daleen Bo [phone]) on 04/03/2017 4:22:12 PM       Radiology Dg Chest Port 1 View  Result Date: 04/03/2017 CLINICAL DATA:  Weakness for 3 days. EXAM: PORTABLE CHEST 1 VIEW COMPARISON:  Single-view of the chest 11/22/2015. CT chest 11/15/2015. PA and  lateral chest 11/14/2015. FINDINGS: Mild, chronic scar in the right base is unchanged. Lungs otherwise clear. Heart size is normal. No pneumothorax or pleural fluid. Aortic atherosclerosis is noted. No acute bony abnormality. IMPRESSION: No acute disease. Atherosclerosis. Electronically Signed   By: Inge Rise M.D.   On: 04/03/2017 16:14    Procedures Procedures (including critical care time)  Medications Ordered in ED Medications  sodium chloride 0.9 % bolus 1,000 mL (not administered)    And  sodium chloride 0.9 % bolus 1,000 mL (0 mLs Intravenous Stopped 04/03/17 1733)  sodium chloride 0.9 % bolus 1,000 mL (1,000 mLs Intravenous New Bag/Given 04/03/17 1747)  piperacillin-tazobactam (ZOSYN) IVPB 3.375 g (0 g Intravenous Stopped 04/03/17 1802)  vancomycin (VANCOCIN) IVPB 1000 mg/200 mL premix (1,000 mg Intravenous New Bag/Given 04/03/17 1732)     Initial Impression / Assessment and Plan / ED Course  I have reviewed the triage vital signs and the nursing notes.  Pertinent labs & imaging results that were available during my care of the patient were reviewed by me and considered in my medical decision making (see chart for details).  Clinical Course as of Apr 03 1836  Thu Apr 03, 2017  1754 Sodium: (!) 129 [AH]  1755 WBC: (!) 26.8 [AH]  1755 Hemoglobin: (!) 10.0 [AH]    Clinical Course User Index [AH] Margarita Mail, PA-C    Patient feels improved. She is asking to eat and drink. She is acute kidney injury, hyponatremia, elevated white count. I suspect urine infection. However, she has not had any urinary output at this time. We'll continue to rehydrate.  Final Clinical Impressions(s) / ED Diagnoses   Final diagnoses:  Sepsis, due to unspecified organism ([place])  Leukocytosis, unspecified type  Acute kidney injury [facility])    New Prescriptions New Prescriptions   No medications on file     Margarita Mail, PA-C 04/03/17 1837    Daleen Bo, MD 04/04/17 [phone]

## 2017-04-03 NOTE — ED Notes (Signed)
BLOOD CULTURES WERE DRAWN BEFORE ANTIBIOTICS

## 2017-04-03 NOTE — ED Notes (Signed)
Bed: WA11 Expected date:  Expected time:  Means of arrival:  Comments: 

## 2017-04-03 NOTE — Progress Notes (Signed)
A consult was received from an ED physician for vancomycin and Zosyn per pharmacy dosing.  The patient's profile has been reviewed for ht/wt/allergies/indication/available labs.   A one time order has been placed for vancomycin 1g and Zosyn 3.375 g.  Further antibiotics/pharmacy consults should be ordered by admitting physician if indicated.                       Thank you,  Reuel Boom, PharmD, BCPS Pager: [phone] 04/03/2017, 5:16 PM

## 2017-04-03 NOTE — ED Triage Notes (Signed)
Per EMS, pt from home complains of weakness x 3 days. Pt has urostomy, states her urine appears darker than usual. Pt reports temperature of 99.8 this morning. Pt took tylenol and an old amoxicillin at ~9AM today. Pt states she stopped taking her eliguis 1 month ago.   CBG 251 BP 94/40

## 2017-04-04 DIAGNOSIS — N179 Acute kidney failure, unspecified: Secondary | ICD-10-CM

## 2017-04-04 DIAGNOSIS — I480 Paroxysmal atrial fibrillation: Secondary | ICD-10-CM

## 2017-04-04 DIAGNOSIS — I2510 Atherosclerotic heart disease of native coronary artery without angina pectoris: Secondary | ICD-10-CM

## 2017-04-04 DIAGNOSIS — Z89611 Acquired absence of right leg above knee: Secondary | ICD-10-CM

## 2017-04-04 DIAGNOSIS — Z7901 Long term (current) use of anticoagulants: Secondary | ICD-10-CM

## 2017-04-04 DIAGNOSIS — E1159 Type 2 diabetes mellitus with other circulatory complications: Secondary | ICD-10-CM

## 2017-04-04 DIAGNOSIS — I2583 Coronary atherosclerosis due to lipid rich plaque: Secondary | ICD-10-CM

## 2017-04-04 DIAGNOSIS — I10 Essential (primary) hypertension: Secondary | ICD-10-CM

## 2017-04-04 LAB — COMPREHENSIVE METABOLIC PANEL
ALT: 20 U/L (ref 14–54)
ANION GAP: 7 (ref 5–15)
AST: 31 U/L (ref 15–41)
Albumin: 1.6 g/dL — ABNORMAL LOW (ref 3.5–5.0)
Alkaline Phosphatase: 135 U/L — ABNORMAL HIGH (ref 38–126)
BUN: 81 mg/dL — ABNORMAL HIGH (ref 6–20)
CHLORIDE: 103 mmol/L (ref 101–111)
CO2: 21 mmol/L — AB (ref 22–32)
Calcium: 8.4 mg/dL — ABNORMAL LOW (ref 8.9–10.3)
Creatinine, Ser: 4.21 mg/dL — ABNORMAL HIGH (ref 0.44–1.00)
GFR calc Af Amer: 11 mL/min — ABNORMAL LOW (ref >60)
GFR calc non Af Amer: 9 mL/min — ABNORMAL LOW (ref >60)
GLUCOSE: 135 mg/dL — AB (ref 65–99)
POTASSIUM: 4.6 mmol/L (ref 3.5–5.1)
Sodium: 131 mmol/L — ABNORMAL LOW (ref 135–145)
Total Bilirubin: 0.5 mg/dL (ref 0.3–1.2)
Total Protein: 5.9 g/dL — ABNORMAL LOW (ref 6.5–8.1)

## 2017-04-04 LAB — GLUCOSE, CAPILLARY
GLUCOSE-CAPILLARY: 193 mg/dL — AB (ref 65–99)
GLUCOSE-CAPILLARY: 227 mg/dL — AB (ref 65–99)
Glucose-Capillary: 127 mg/dL — ABNORMAL HIGH (ref 65–99)
Glucose-Capillary: 166 mg/dL — ABNORMAL HIGH (ref 65–99)

## 2017-04-04 LAB — URINALYSIS, ROUTINE W REFLEX MICROSCOPIC
Bacteria, UA: NONE SEEN
Bilirubin Urine: NEGATIVE
Glucose, UA: NEGATIVE mg/dL
Ketones, ur: NEGATIVE mg/dL
Nitrite: NEGATIVE
Specific Gravity, Urine: 1.023 (ref 1.005–1.030)
pH: 6 (ref 5.0–8.0)

## 2017-04-04 LAB — BLOOD CULTURE ID PANEL (REFLEXED)
ACINETOBACTER BAUMANNII: NOT DETECTED
CANDIDA ALBICANS: NOT DETECTED
CANDIDA GLABRATA: NOT DETECTED
CANDIDA KRUSEI: NOT DETECTED
CANDIDA PARAPSILOSIS: NOT DETECTED
Candida tropicalis: NOT DETECTED
Carbapenem resistance: NOT DETECTED
ENTEROBACTER CLOACAE COMPLEX: NOT DETECTED
ENTEROBACTERIACEAE SPECIES: DETECTED — AB
Enterococcus species: NOT DETECTED
Escherichia coli: NOT DETECTED
Haemophilus influenzae: NOT DETECTED
KLEBSIELLA OXYTOCA: DETECTED — AB
KLEBSIELLA PNEUMONIAE: NOT DETECTED
Listeria monocytogenes: NOT DETECTED
Neisseria meningitidis: NOT DETECTED
PSEUDOMONAS AERUGINOSA: NOT DETECTED
Proteus species: NOT DETECTED
STREPTOCOCCUS PYOGENES: NOT DETECTED
Serratia marcescens: NOT DETECTED
Staphylococcus aureus (BCID): NOT DETECTED
Staphylococcus species: NOT DETECTED
Streptococcus agalactiae: NOT DETECTED
Streptococcus pneumoniae: NOT DETECTED
Streptococcus species: NOT DETECTED

## 2017-04-04 LAB — CBC
HEMATOCRIT: 24.3 % — AB (ref 36.0–46.0)
HEMOGLOBIN: 7.7 g/dL — AB (ref 12.0–15.0)
MCH: 24 pg — AB (ref 26.0–34.0)
MCHC: 31.7 g/dL (ref 30.0–36.0)
MCV: 75.7 fL — AB (ref 78.0–100.0)
Platelets: 308 10*3/uL (ref 150–400)
RBC: 3.21 MIL/uL — AB (ref 3.87–5.11)
RDW: 17.1 % — ABNORMAL HIGH (ref 11.5–15.5)
WBC: 23.4 10*3/uL — ABNORMAL HIGH (ref 4.0–10.5)

## 2017-04-04 MED ORDER — DEXTROSE 5 % IV SOLN
2.0000 g | INTRAVENOUS | Status: DC
  Filled 2017-04-04: qty 2

## 2017-04-04 MED ORDER — VANCOMYCIN HCL IN DEXTROSE 1-5 GM/200ML-% IV SOLN: 1000.0000 mg | INTRAVENOUS | Status: DC

## 2017-04-04 MED ORDER — PIPERACILLIN-TAZOBACTAM IN DEX 2-0.25 GM/50ML IV SOLN
2.2500 g | Freq: Three times a day (TID) | INTRAVENOUS | Status: DC
  Administered 2017-04-04 – 2017-04-05 (×3): 2.25 g via INTRAVENOUS
  Filled 2017-04-04 (×5): qty 50

## 2017-04-04 NOTE — Consult Note (Addendum)
[facility] Nurse wound consult note Reason for Consult: LLE ulcer, cellulitis Wound type:venous insufficiency, cellulitis, and Left Lateral heel has a healing stage IV ulcer now covered with scar tissue.  From the amount of deformity in the area I have to assume it was a stage IV.  Pressure Injury POA: Yes, left lateral heel Measurement:LLE edema measures 12cm long between knee and ankle and encircles the entire calf. Has two small 1cm x 1cm x 0.1cm partial thickness wounds on pretibial area with pink bed, slight yellow exudate, periwound is dry with edema and erythema. Left lateral heel has 3.5cm round healing wound of unknown original stage. Now is scar tissue with darkened perimeter. Her great toe has been amputated previously due to gangrene per pt.  Her second toe has 1cm x 2cm (encircles the bed of nail) dried area that appears to have been a blood blister that is resolving. The third two has a smaller 0.3 round resolving blood blister.  Wound bed:see above Drainage (amount, consistency, odor) see above  Perimeter: see above Dressing procedure/placement/frequency: I have placed and ordered cleansing leg with NS, pat dry, apply Xeroform, wrap with kerlix and wrap with ace from toes to knee in a spiral fashion, change daily. Foam placed and ordered over healing heel wound. Pt does not want a Prevalon boot due to since she got the heel wound she always keeps foot hanging in air by placing on a pillow. She is somewhat mobile (mostly pivoting to wheel chair)  and does not want to remove and replace it.  We will not follow, but will remain available to this patient, to nursing, and the medical and/or surgical teams.Please re-consult if we need to assist further.   Fara Olden, RN-C, WTA-C, [facility] Wound Treatment Associate

## 2017-04-04 NOTE — Progress Notes (Signed)
Nutrition Brief Note  Patient identified on the Malnutrition Screening Tool (MST) Report  Spoke with RD who reports pt has good appetite just really sleepy. Pt reports appetite was stable prior to admission with no unintentional wt loss.   Wt Readings from Last 15 Encounters:  04/03/17 209 lb 14.1 oz (95.2 kg)  12/05/16 200 lb (90.7 kg)  12/03/16 206 lb (93.4 kg)  10/17/16 206 lb (93.4 kg)  08/27/16 210 lb (95.3 kg)  08/16/16 210 lb (95.3 kg)  04/10/16 208 lb 1.9 oz (94.4 kg)  03/12/16 203 lb (92.1 kg)  12/19/15 192 lb 6.4 oz (87.3 kg)  11/26/15 192 lb 1.6 oz (87.1 kg)  11/14/15 198 lb (89.8 kg)  08/29/15 195 lb (88.5 kg)  04/28/15 205 lb 6.4 oz (93.2 kg)  02/14/15 204 lb (92.5 kg)  11/08/14 214 lb (97.1 kg)    Body mass index is 37.18 kg/m. Patient meets criteria for obese based on current BMI.   Current diet order is carb modified, patient is consuming approximately 50% of meals at this time. Labs and medications reviewed.   No nutrition interventions warranted at this time. If nutrition issues arise, please consult RD.   Mariana Single RD, LDN Clinical Nutrition Pager # [phone]

## 2017-04-04 NOTE — Progress Notes (Addendum)
Pharmacy Antibiotic Note  [name] is a 76 y.o. female admitted on 04/03/2017 with bacteremia and cellulits.  Pharmacy has been consulted for vancomycin and zosyn dosing. Pt with Kleb oxytoca bacteremia, likely d/t urinary source, cystectomy w/ nephrostomy bag x 4 yrs. Tmax 100.4 WBC 26.8>23.4, lactate 1.64, Cr 4.21, creat cl < 20 ml/min.  CXR clear, Renal US neg.  MD also concerned about cellulitis of LLE.  Vanc 1 gm given in ED 8/23 at [place] -d/w Dr. Cruzita Lederer- he wants to cover LLE cellulitis with vanc and keep zosyn to cover Kleb in blood and cellulitis instead of narrowing to Rocephin for Kleb oxytoca  Plan: Vancomycin 1000 mg IV every 48 hours.  Goal trough 15-20 mcg/mL.  Zosyn 2.25 gm IV q8h Daily SCr F/u renal function, WBC, temp, culture data Vancomycin levels as needed  Weight: 209 lb 14.1 oz (95.2 kg)  Temp (24hrs), Avg:99.5 F (37.5 C), Min:98.8 F (37.1 C), Max:100.4 F (38 C)   Recent Labs Lab 04/03/17 1627 04/03/17 1644 04/03/17 1905 04/04/17 0656  WBC 26.8*  --   --  23.4*  CREATININE 4.09*  --   --  4.21*  LATICACIDVEN  --  2.81* 1.64  --     Estimated Creatinine Clearance: 12.7 mL/min (A) (by C-G formula based on SCr of 4.21 mg/dL (H)).    Allergies  Allergen Reactions  . Gabapentin Other (See Comments)    Extremely drunk and lethargic  . Lisinopril Other (See Comments)    Increased creatinine levels (pt currently taking low dose)  . Metformin And Related Other (See Comments)    Increased creatinine levels  . Biaxin [Clarithromycin] Itching   Antimicrobials this admission: 8/23 Vanc x 1 at 1732   8/24>> 8/23 Zosyn >>   Dose adjustments this admission:   Microbiology results: 8/23 BCx:  BCID Klebsiella oxytoca 4/4 bottles- 2 sets 8/23 UCx:  Thank you for allowing pharmacy to be a part of this patient's care.  Eudelia Bunch, Pharm.D. [phone] 04/04/2017 10:42 AM

## 2017-04-04 NOTE — Progress Notes (Addendum)
PROGRESS NOTE  [name] YTK:[id number] DOB: 1941-06-27 DOA: 04/03/2017 PCP: Leighton Ruff, MD   LOS: 1 day   Brief Narrative / Interim history: Pt is a 76 yo female with hx of bladder cancer s/p bladder removal / total hystrectomy with urostomy bag placed 4 years ago complicated with UTIs, comes to the hospital with cc of fatigue, poor appetite, nausea for the past 2-3 days with mild epigastric discomfort w/o fever/chills. No diarrhea/constipation. No cough/chest pain/dyspnea. No other complaints. No open wounds.   Assessment & Plan: Active Problems:   History of bladder cancer   HTN (hypertension)   CAD (coronary artery disease)   Type 2 diabetes mellitus with vascular disease (HCC)   Status post above knee amputation of right lower extremity (HCC)   Obesity (BMI 30-39.9)   AKI (acute kidney injury) ([place])   Paroxysmal atrial fibrillation (HCC)   Chronic anticoagulation   Sepsis ([place])   Sepsis due to UTI / Klebsiella bacteremia -Patient still with a fever of 100.4 this morning, her blood pressures are soft.  Lactic acid was initially elevated but resolved with IV fluids.  She was started on broad-spectrum antibiotics -Continue IV fluids -Blood cultures just speciated today with Klebsiella oxytoca, continue Zosyn  Anemia -Hemoglobin dropped to 7.7 after initially being in the 10 range, this looks dilutional, no evidence of bleeding currently, closely monitor  Paroxysmal atrial fibrillation - patient's CHA2DS2-VASc Score for Stroke Risk is > 2.  She was initially on Xarelto however that was later changed to Eliquis.  Patient has noticed some hematuria in her urostomy back about a month ago and has not taken any Eliquis since.  She understands the risks of increasing chance of stroke.  Type 2 diabetes mellitus, poorly controlled, with renal vascular complications -Continue patient's Lantus as well as sliding scale.  Obtain hemoglobin A1c.  Acute kidney injury and chronic  kidney disease stage III -Likely in the setting of sepsis and poor p.o. intake over the last few days, continue aggressive fluids and closely monitor renal function.  Strict monitoring of I's and O's.  Avoid nephrotoxins.  Peripheral arterial disease -Patient is status post BKA amputation on the right.  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  She had a calcaneal ulcer on the left who is now healing.  She has a small partial-thickness wound on the pretibial area on the left with surrounding erythema -she is being followed by Dr. Donnetta Hutching. She is status post left femoral to anterior tibial bypass in February 2016  Possible cellulitis left pretibial area -Margins drawn today, wound care, antibiotics  Coronary artery disease -No reports of chest pain, most recent cardiac evaluation included a 2D echo in April 2017 which showed an EF of 65-70%, moderate LVH, normal wall motions  Hyponatremia -Likely due to dehydration, sodium improving with IV fluids, continue   DVT prophylaxis: heparin Code Status: Full code Family Communication: no family at bedside Disposition Plan: TBD  Consultants:   None   Procedures:   None   Antimicrobials:  Vancomycin 8/23 >>  Zosyn 8/23 >>  Subjective: -Still feeling weak however somewhat improved as compared to when she came to the hospital.  Denies any chest pain, denies any shortness of breath.  Denies any abdominal pain  Objective: Vitals:   04/03/17 1847 04/03/17 2009 04/04/17 0626 04/04/17 0923  BP:  109/71 (!) 96/42 (!) 96/48  Pulse:  69 63 65  Resp:  16 16 16   Temp: 99.4 F (37.4  C) 98.8 F (37.1 C) 99.7 F (37.6 C) (!) 100.4 F (38 C)  TempSrc: Rectal Oral Oral Oral  SpO2:  99% 97% 99%  Weight:  95.2 kg (209 lb 14.1 oz)      Intake/Output Summary (Last 24 hours) at 04/04/17 1005 Last data filed at 04/04/17 0600  Gross per 24 hour  Intake             1625 ml  Output              250 ml  Net             1375 ml    Filed Weights   04/03/17 2009  Weight: 95.2 kg (209 lb 14.1 oz)    Examination:  Vitals:   04/03/17 1847 04/03/17 2009 04/04/17 0626 04/04/17 0923  BP:  109/71 (!) 96/42 (!) 96/48  Pulse:  69 63 65  Resp:  16 16 16   Temp: 99.4 F (37.4 C) 98.8 F (37.1 C) 99.7 F (37.6 C) (!) 100.4 F (38 C)  TempSrc: Rectal Oral Oral Oral  SpO2:  99% 97% 99%  Weight:  95.2 kg (209 lb 14.1 oz)      Constitutional: NAD Eyes: lids and conjunctivae normal ENMT: Mucous membranes are dry Neck: normal, supple Respiratory: clear to auscultation bilaterally, no wheezing, no crackles. Normal respiratory effort.  Cardiovascular: Regular rate and rhythm, no murmurs / rubs / gallops. No LE edema. 2+ pedal pulses.  Abdomen: no tenderness. Bowel sounds positive.  Urostomy bag present with cloudy yellow contents Skin/MSK: Right BKA.  Left calcaneal ulcer fully epithelialized, healing.  Left anterior shin with a small partial-thickness wound, with surrounding cellulitis Neurologic: CN 2-12 grossly intact. Strength 5/5 in all 4.  Psychiatric: Normal judgment and insight. Alert and oriented x 3. Normal mood.    Data Reviewed: I have independently reviewed following labs and imaging studies   CBC:  Recent Labs Lab 04/03/17 1627 04/04/17 0656  WBC 26.8* 23.4*  NEUTROABS 25.5*  --   HGB 10.0* 7.7*  HCT 30.3* 24.3*  MCV 76.1* 75.7*  PLT 415* 161   Basic Metabolic Panel:  Recent Labs Lab 04/03/17 1627 04/04/17 0656  NA 129* 131*  K 4.4 4.6  CL 95* 103  CO2 23 21*  GLUCOSE 234* 135*  BUN 88* 81*  CREATININE 4.09* 4.21*  CALCIUM 9.6 8.4*   GFR: Estimated Creatinine Clearance: 12.7 mL/min (A) (by C-G formula based on SCr of 4.21 mg/dL (H)). Liver Function Tests:  Recent Labs Lab 04/03/17 1627 04/04/17 0656  AST 27 31  ALT 22 20  ALKPHOS 169* 135*  BILITOT 0.6 0.5  PROT 7.8 5.9*  ALBUMIN 2.4* 1.6*   No results for input(s): LIPASE, AMYLASE in the last 168 hours. No results for  input(s): AMMONIA in the last 168 hours. Coagulation Profile: No results for input(s): INR, PROTIME in the last 168 hours. Cardiac Enzymes: No results for input(s): CKTOTAL, CKMB, CKMBINDEX, TROPONINI in the last 168 hours. BNP (last 3 results) No results for input(s): PROBNP in the last 8760 hours. HbA1C: No results for input(s): HGBA1C in the last 72 hours. CBG:  Recent Labs Lab 04/03/17 1612 04/03/17 1859 04/04/17 0747  GLUCAP 227* 234* 127*   Lipid Profile: No results for input(s): CHOL, HDL, LDLCALC, TRIG, CHOLHDL, LDLDIRECT in the last 72 hours. Thyroid Function Tests: No results for input(s): TSH, T4TOTAL, FREET4, T3FREE, THYROIDAB in the last 72 hours. Anemia Panel: No results for input(s): VITAMINB12, FOLATE, FERRITIN, TIBC,  IRON, RETICCTPCT in the last 72 hours. Urine analysis:    Component Value Date/Time   COLORURINE AMBER (A) 04/03/2017 1540   APPEARANCEUR CLOUDY (A) 04/03/2017 1540   LABSPEC 1.023 04/03/2017 1540   PHURINE 6.0 04/03/2017 1540   GLUCOSEU NEGATIVE 04/03/2017 1540   HGBUR LARGE (A) 04/03/2017 1540   BILIRUBINUR NEGATIVE 04/03/2017 1540   KETONESUR NEGATIVE 04/03/2017 1540   PROTEINUR >=300 (A) 04/03/2017 1540   UROBILINOGEN 0.2 09/12/2014 0949   NITRITE NEGATIVE 04/03/2017 1540   LEUKOCYTESUR MODERATE (A) 04/03/2017 1540   Sepsis Labs: Invalid input(s): PROCALCITONIN, LACTICIDVEN  Recent Results (from the past 240 hour(s))  Blood Culture (routine x 2)     Status: None (Preliminary result)   Collection Time: 04/03/17  5:49 PM  Result Value Ref Range Status   Specimen Description BLOOD BLOOD LEFT FOREARM  Final   Special Requests   Final    BOTTLES DRAWN AEROBIC AND ANAEROBIC Blood Culture results may not be optimal due to an inadequate volume of blood received in culture bottles   Culture  Setup Time   Final    GRAM NEGATIVE RODS IN BOTH AEROBIC AND ANAEROBIC BOTTLES CRITICAL RESULT CALLED TO, READ BACK BY AND VERIFIED WITH: Irwin Brakeman AT 3532 04/04/17 BY L BENFIELD Performed at [facility], [place] [address] [place], [place] [address]    Culture GRAM NEGATIVE RODS  Final   Report Status PENDING  Incomplete  Blood Culture ID Panel (Reflexed)     Status: Abnormal   Collection Time: 04/03/17  5:49 PM  Result Value Ref Range Status   Enterococcus species NOT DETECTED NOT DETECTED Final   Listeria monocytogenes NOT DETECTED NOT DETECTED Final   Staphylococcus species NOT DETECTED NOT DETECTED Final   Staphylococcus aureus NOT DETECTED NOT DETECTED Final   Streptococcus species NOT DETECTED NOT DETECTED Final   Streptococcus agalactiae NOT DETECTED NOT DETECTED Final   Streptococcus pneumoniae NOT DETECTED NOT DETECTED Final   Streptococcus pyogenes NOT DETECTED NOT DETECTED Final   Acinetobacter baumannii NOT DETECTED NOT DETECTED Final   Enterobacteriaceae species DETECTED (A) NOT DETECTED Final    Comment: Enterobacteriaceae represent a large family of gram-negative bacteria, not a single organism. CRITICAL RESULT CALLED TO, READ BACK BY AND VERIFIED WITH: M LILLISTON,PHARMD AT 0919 04/04/17 BY L BENFIELD    Enterobacter cloacae complex NOT DETECTED NOT DETECTED Final   Escherichia coli NOT DETECTED NOT DETECTED Final   Klebsiella oxytoca DETECTED (A) NOT DETECTED Final    Comment: CRITICAL RESULT CALLED TO, READ BACK BY AND VERIFIED WITH: M LILLISTON,PHARMD AT 0919 04/04/17 BY L BENFIELD    Klebsiella pneumoniae NOT DETECTED NOT DETECTED Final   Proteus species NOT DETECTED NOT DETECTED Final   Serratia marcescens NOT DETECTED NOT DETECTED Final   Carbapenem resistance NOT DETECTED NOT DETECTED Final   Haemophilus influenzae NOT DETECTED NOT DETECTED Final   Neisseria meningitidis NOT DETECTED NOT DETECTED Final   Pseudomonas aeruginosa NOT DETECTED NOT DETECTED Final   Candida albicans NOT DETECTED NOT DETECTED Final   Candida glabrata NOT DETECTED NOT DETECTED Final   Candida krusei NOT  DETECTED NOT DETECTED Final   Candida parapsilosis NOT DETECTED NOT DETECTED Final   Candida tropicalis NOT DETECTED NOT DETECTED Final    Comment: Performed at [facility], [place] [address], [place], [place] [address]      Radiology Studies: US Renal  Result Date: 04/03/2017 CLINICAL DATA:  Renal injury. EXAM: RENAL / URINARY TRACT ULTRASOUND  COMPLETE COMPARISON:  Ultrasound of January 17, 2017. FINDINGS: Right Kidney: Length: 11.3 cm. 1.7 cm simple cyst is seen in midpole. Echogenicity within normal limits. No mass or hydronephrosis visualized. Left Kidney: Not well-visualized, presumably due to body habitus. Bladder: Surgically removed. IMPRESSION: Simple right renal cyst is noted. Otherwise right kidney appears normal. Left kidney is not well-visualized, presumably due to body habitus. CT scan may be performed for further evaluation. Electronically Signed   By: Marijo Conception, M.D.   On: 04/03/2017 20:40   Dg Chest Port 1 View  Result Date: 04/03/2017 CLINICAL DATA:  Weakness for 3 days. EXAM: PORTABLE CHEST 1 VIEW COMPARISON:  Single-view of the chest 11/22/2015. CT chest 11/15/2015. PA and lateral chest 11/14/2015. FINDINGS: Mild, chronic scar in the right base is unchanged. Lungs otherwise clear. Heart size is normal. No pneumothorax or pleural fluid. Aortic atherosclerosis is noted. No acute bony abnormality. IMPRESSION: No acute disease. Atherosclerosis. Electronically Signed   By: Inge Rise M.D.   On: 04/03/2017 16:14     Scheduled Meds: . amiodarone  200 mg Oral QHS  . atorvastatin  80 mg Oral q1800  . heparin  5,000 Units Subcutaneous Q8H  . insulin aspart  0-9 Units Subcutaneous TID WC  . insulin glargine  10 Units Subcutaneous QHS  . pantoprazole  40 mg Oral Daily  . sodium bicarbonate  1,300 mg Oral BID   Continuous Infusions: . sodium chloride 125 mL/hr at 04/03/17 1900  . piperacillin-tazobactam (ZOSYN)  IV 2.25 g (04/04/17 0600)    Marzetta Board, MD,  PhD [facility] Pager [phone]   If 7PM-7AM, please contact night-coverage [email or web address] Password [email or web address] 04/04/2017, 10:05 AM

## 2017-04-05 DIAGNOSIS — L8990 Pressure ulcer of unspecified site, unspecified stage: Secondary | ICD-10-CM | POA: Insufficient documentation

## 2017-04-05 DIAGNOSIS — D72829 Elevated white blood cell count, unspecified: Secondary | ICD-10-CM

## 2017-04-05 LAB — CBC
HCT: 22.8 % — ABNORMAL LOW (ref 36.0–46.0)
HEMOGLOBIN: 7.3 g/dL — AB (ref 12.0–15.0)
MCH: 24.8 pg — AB (ref 26.0–34.0)
MCHC: 32 g/dL (ref 30.0–36.0)
MCV: 77.6 fL — ABNORMAL LOW (ref 78.0–100.0)
Platelets: 298 10*3/uL (ref 150–400)
RBC: 2.94 MIL/uL — ABNORMAL LOW (ref 3.87–5.11)
RDW: 17.7 % — AB (ref 11.5–15.5)
WBC: 20 10*3/uL — ABNORMAL HIGH (ref 4.0–10.5)

## 2017-04-05 LAB — COMPREHENSIVE METABOLIC PANEL
ALBUMIN: 1.5 g/dL — AB (ref 3.5–5.0)
ALK PHOS: 150 U/L — AB (ref 38–126)
ALT: 23 U/L (ref 14–54)
ANION GAP: 8 (ref 5–15)
AST: 28 U/L (ref 15–41)
BILIRUBIN TOTAL: 0.3 mg/dL (ref 0.3–1.2)
BUN: 81 mg/dL — AB (ref 6–20)
CALCIUM: 8.1 mg/dL — AB (ref 8.9–10.3)
CO2: 19 mmol/L — AB (ref 22–32)
CREATININE: 4.78 mg/dL — AB (ref 0.44–1.00)
Chloride: 103 mmol/L (ref 101–111)
GFR calc Af Amer: 9 mL/min — ABNORMAL LOW (ref >60)
GFR calc non Af Amer: 8 mL/min — ABNORMAL LOW (ref >60)
GLUCOSE: 163 mg/dL — AB (ref 65–99)
Potassium: 4.2 mmol/L (ref 3.5–5.1)
SODIUM: 130 mmol/L — AB (ref 135–145)
TOTAL PROTEIN: 5.8 g/dL — AB (ref 6.5–8.1)

## 2017-04-05 LAB — URINE CULTURE

## 2017-04-05 LAB — HEMOGLOBIN A1C
Hgb A1c MFr Bld: 12 % — ABNORMAL HIGH (ref 4.8–5.6)
Mean Plasma Glucose: 297.7 mg/dL

## 2017-04-05 LAB — GLUCOSE, CAPILLARY
GLUCOSE-CAPILLARY: 150 mg/dL — AB (ref 65–99)
GLUCOSE-CAPILLARY: 155 mg/dL — AB (ref 65–99)
Glucose-Capillary: 178 mg/dL — ABNORMAL HIGH (ref 65–99)
Glucose-Capillary: 188 mg/dL — ABNORMAL HIGH (ref 65–99)

## 2017-04-05 MED ORDER — FUROSEMIDE 10 MG/ML IJ SOLN
80.0000 mg | Freq: Two times a day (BID) | INTRAMUSCULAR | Status: DC
  Administered 2017-04-05 – 2017-04-06 (×2): 80 mg via INTRAVENOUS
  Filled 2017-04-05 (×2): qty 8

## 2017-04-05 MED ORDER — DIPHENHYDRAMINE HCL 25 MG PO CAPS
25.0000 mg | ORAL_CAPSULE | Freq: Every evening | ORAL | Status: DC | PRN
  Administered 2017-04-11 (×2): 25 mg via ORAL
  Filled 2017-04-05 (×2): qty 1

## 2017-04-05 MED ORDER — OXYCODONE-ACETAMINOPHEN 5-325 MG PO TABS
1.0000 | ORAL_TABLET | ORAL | Status: DC | PRN
  Administered 2017-04-05 – 2017-04-13 (×9): 1 via ORAL
  Filled 2017-04-05 (×9): qty 1

## 2017-04-05 MED ORDER — MORPHINE SULFATE (PF) 2 MG/ML IV SOLN
2.0000 mg | INTRAVENOUS | Status: DC | PRN
  Administered 2017-04-05 – 2017-04-06 (×3): 2 mg via INTRAVENOUS
  Filled 2017-04-05 (×3): qty 1

## 2017-04-05 MED ORDER — FUROSEMIDE 10 MG/ML IJ SOLN
20.0000 mg | Freq: Once | INTRAMUSCULAR | Status: AC
  Administered 2017-04-05: 20 mg via INTRAVENOUS
  Filled 2017-04-05: qty 2

## 2017-04-05 MED ORDER — DEXTROSE 5 % IV SOLN
2.0000 g | INTRAVENOUS | Status: DC
  Administered 2017-04-05 – 2017-04-13 (×9): 2 g via INTRAVENOUS
  Filled 2017-04-05 (×9): qty 2

## 2017-04-05 NOTE — Progress Notes (Signed)
PROGRESS NOTE  [name] ZOX:[id number] DOB: 1941/01/30 DOA: 04/03/2017 PCP: Leighton Ruff, MD   LOS: 2 days   Brief Narrative / Interim history: Pt is a 76 yo female with hx of bladder cancer s/p bladder removal / total hystrectomy with urostomy bag placed 4 years ago complicated with UTIs, comes to the hospital with cc of fatigue, poor appetite, nausea for the past 2-3 days with mild epigastric discomfort w/o fever/chills. No diarrhea/constipation. No cough/chest pain/dyspnea. No other complaints. No open wounds.   Assessment & Plan: Active Problems:   History of bladder cancer   HTN (hypertension)   CAD (coronary artery disease)   Type 2 diabetes mellitus with vascular disease (HCC)   Status post above knee amputation of right lower extremity (HCC)   Obesity (BMI 30-39.9)   AKI (acute kidney injury) ([place])   Paroxysmal atrial fibrillation (HCC)   Chronic anticoagulation   Sepsis ([place])   Sepsis due to UTI / Klebsiella bacteremia -Patient's fever curve improving, she is afebrile this morning.  Blood pressure seems to have improved and she is no longer hypotensive -Discontinue IV fluids as patient is beginning to get fluid overloaded -Blood cultures with Klebsiella, narrow to ceftriaxone -Urine culture negative, however suspect UTI to be cause for her bacteremia.  She has no GI symptoms -Obtain surveillance cultures if she remains afebrile until tomorrow -Leukocytosis is still quite high at 20 K  Acute kidney injury and chronic kidney disease stage III -Likely in the setting of sepsis and poor p.o. intake over the last few days -Patient was intermittently hypotensive as well, she was fluid resuscitated aggressively.  She is becoming a little bit more fluid overloaded today, his lower extremity edema as well as hand swelling.  She is on room air and does not seem to have significant respiratory issues -Creatinine continues to worsen despite fluid resuscitation has gone from  4 on admission to 4.8 today -Urine output overnight was only 400 cc.  Consulted nephrology, discussed with Dr. Jonnie Finner, appreciate input.  Will give low-dose Lasix for now -Stop vancomycin and Zosyn as well as above  Anemia -Hemoglobin dropped to 7.7>>7.3 after initially being in the 10 range, this looks dilutional, no evidence of bleeding currently, closely monitor -Sepsis definitely can contribute to acute anemia  Paroxysmal atrial fibrillation -patient's CHA2DS2-VASc Score for Stroke Risk is > 2.  She was initially on Xarelto however that was later changed to Eliquis.  Patient has noticed some hematuria in her urostomy back about a month ago and has not taken any Eliquis since.  She understands the risks of increasing chances of stroke.  Type 2 diabetes mellitus, poorly controlled, with renal vascular complications -Continue patient's Lantus as well as sliding scale.  Hemoglobin A1c 12 -Fasting CBG 150, continue current regimen  Peripheral arterial disease -Patient is status post BKA amputation on the right.  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  She had a calcaneal ulcer on the left who is now healing.  She has a small partial-thickness wound on the pretibial area on the left with surrounding erythema, wound care was consulted -she is being followed by Dr. Donnetta Hutching. She is status post left femoral to anterior tibial bypass in February 2016  Possible cellulitis left pretibial area -Margins drawn today, wound care, antibiotics -Improving  Coronary artery disease -No reports of chest pain, most recent cardiac evaluation included a 2D echo in April 2017 which showed an EF of 65-70%, moderate LVH, normal wall motions  Hyponatremia -Likely due to dehydration, sodium improving with IV fluids, continue   DVT prophylaxis: heparin Code Status: Full code Family Communication: no family at bedside Disposition Plan: TBD  Consultants:   None   Procedures:   None    Antimicrobials:  Vancomycin 8/23 >> 8/25  Zosyn 8/23 >>8/25  Subjective: -Continues to feel poorly, complains of being very weak, has no shortness of breath this morning.  Denies any chest pain.  Objective: Vitals:   04/04/17 1045 04/04/17 1430 04/04/17 2003 04/05/17 0524  BP:  (!) 120/46 (!) 113/53 (!) 116/46  Pulse:  72 74 83  Resp:  18 18 18   Temp: 98.8 F (37.1 C) 98.9 F (37.2 C) 99.1 F (37.3 C) 98.7 F (37.1 C)  TempSrc: Oral Oral Oral Oral  SpO2:  99% 100% 97%  Weight:   95.2 kg (209 lb 14.1 oz)   Height:   5\' 3"  (1.6 m)     Intake/Output Summary (Last 24 hours) at 04/05/17 1134 Last data filed at 04/05/17 7741  Gross per 24 hour  Intake             3080 ml  Output              400 ml  Net             2680 ml   Filed Weights   04/03/17 2009 04/04/17 2003  Weight: 95.2 kg (209 lb 14.1 oz) 95.2 kg (209 lb 14.1 oz)    Examination:  Vitals:   04/04/17 1045 04/04/17 1430 04/04/17 2003 04/05/17 0524  BP:  (!) 120/46 (!) 113/53 (!) 116/46  Pulse:  72 74 83  Resp:  18 18 18   Temp: 98.8 F (37.1 C) 98.9 F (37.2 C) 99.1 F (37.3 C) 98.7 F (37.1 C)  TempSrc: Oral Oral Oral Oral  SpO2:  99% 100% 97%  Weight:   95.2 kg (209 lb 14.1 oz)   Height:   5\' 3"  (1.6 m)    Constitutional: NAD, calm, comfortable Eyes:  lids and conjunctivae normal ENMT: Mucous membranes are moist.  Neck: normal, supple Respiratory: no wheezing, mostly clear. Poor respiratory effort.  Cardiovascular: Regular rate and rhythm, no murmurs / rubs / gallops. 1+ LE edema. 2+ pedal pulses.  Abdomen: no tenderness. Bowel sounds positive.  Skin/MSK: right BKA. Left leg wrapped. Hands appear swollen Neurologic: non focal   Data Reviewed: I have independently reviewed following labs and imaging studies   CBC:  Recent Labs Lab 04/03/17 1627 04/04/17 0656 04/05/17 0529  WBC 26.8* 23.4* 20.0*  NEUTROABS 25.5*  --   --   HGB 10.0* 7.7* 7.3*  HCT 30.3* 24.3* 22.8*  MCV 76.1* 75.7*  77.6*  PLT 415* 308 287   Basic Metabolic Panel:  Recent Labs Lab 04/03/17 1627 04/04/17 0656 04/05/17 0529  NA 129* 131* 130*  K 4.4 4.6 4.2  CL 95* 103 103  CO2 23 21* 19*  GLUCOSE 234* 135* 163*  BUN 88* 81* 81*  CREATININE 4.09* 4.21* 4.78*  CALCIUM 9.6 8.4* 8.1*   GFR: Estimated Creatinine Clearance: 11.2 mL/min (A) (by C-G formula based on SCr of 4.78 mg/dL (H)). Liver Function Tests:  Recent Labs Lab 04/03/17 1627 04/04/17 0656 04/05/17 0529  AST 27 31 28   ALT 22 20 23   ALKPHOS 169* 135* 150*  BILITOT 0.6 0.5 0.3  PROT 7.8 5.9* 5.8*  ALBUMIN 2.4* 1.6* 1.5*   No results for input(s): LIPASE, AMYLASE in the last 168 hours.  No results for input(s): AMMONIA in the last 168 hours. Coagulation Profile: No results for input(s): INR, PROTIME in the last 168 hours. Cardiac Enzymes: No results for input(s): CKTOTAL, CKMB, CKMBINDEX, TROPONINI in the last 168 hours. BNP (last 3 results) No results for input(s): PROBNP in the last 8760 hours. HbA1C:  Recent Labs  04/05/17 0529  HGBA1C 12.0*   CBG:  Recent Labs Lab 04/04/17 1149 04/04/17 1723 04/04/17 2141 04/05/17 0731 04/05/17 1128  GLUCAP 166* 193* 227* 150* 155*   Lipid Profile: No results for input(s): CHOL, HDL, LDLCALC, TRIG, CHOLHDL, LDLDIRECT in the last 72 hours. Thyroid Function Tests: No results for input(s): TSH, T4TOTAL, FREET4, T3FREE, THYROIDAB in the last 72 hours. Anemia Panel: No results for input(s): VITAMINB12, FOLATE, FERRITIN, TIBC, IRON, RETICCTPCT in the last 72 hours. Urine analysis:    Component Value Date/Time   COLORURINE AMBER (A) 04/03/2017 1540   APPEARANCEUR CLOUDY (A) 04/03/2017 1540   LABSPEC 1.023 04/03/2017 1540   PHURINE 6.0 04/03/2017 1540   GLUCOSEU NEGATIVE 04/03/2017 1540   HGBUR LARGE (A) 04/03/2017 1540   BILIRUBINUR NEGATIVE 04/03/2017 1540   KETONESUR NEGATIVE 04/03/2017 1540   PROTEINUR >=300 (A) 04/03/2017 1540   UROBILINOGEN 0.2 09/12/2014 0949     NITRITE NEGATIVE 04/03/2017 1540   LEUKOCYTESUR MODERATE (A) 04/03/2017 1540   Sepsis Labs: Invalid input(s): PROCALCITONIN, LACTICIDVEN  Recent Results (from the past 240 hour(s))  Culture, Urine     Status: Abnormal   Collection Time: 04/03/17  3:40 PM  Result Value Ref Range Status   Specimen Description URINE, RANDOM  Final   Special Requests NONE  Final   Culture MULTIPLE SPECIES PRESENT, SUGGEST RECOLLECTION (A)  Final   Report Status 04/05/2017 FINAL  Final  Blood Culture (routine x 2)     Status: None (Preliminary result)   Collection Time: 04/03/17  4:34 PM  Result Value Ref Range Status   Specimen Description BLOOD BLOOD RIGHT FOREARM  Final   Special Requests   Final    BOTTLES DRAWN AEROBIC AND ANAEROBIC Blood Culture adequate volume   Culture  Setup Time   Final    GRAM NEGATIVE RODS IN BOTH AEROBIC AND ANAEROBIC BOTTLES CRITICAL RESULT CALLED TO, READ BACK BY AND VERIFIED WITH: A RUNYON,PHARMD AT 12330 04/04/17 BY L BENFIELD Performed at [facility], [place] [address], [place], [place] [address]    Culture GRAM NEGATIVE RODS  Final   Report Status PENDING  Incomplete  Blood Culture (routine x 2)     Status: Abnormal (Preliminary result)   Collection Time: 04/03/17  5:49 PM  Result Value Ref Range Status   Specimen Description BLOOD BLOOD LEFT FOREARM  Final   Special Requests   Final    BOTTLES DRAWN AEROBIC AND ANAEROBIC Blood Culture results may not be optimal due to an inadequate volume of blood received in culture bottles   Culture  Setup Time   Final    GRAM NEGATIVE RODS IN BOTH AEROBIC AND ANAEROBIC BOTTLES CRITICAL RESULT CALLED TO, READ BACK BY AND VERIFIED WITH: M LILLISTON,PHARMD AT 0919 04/04/17 BY L BENFIELD    Culture (A)  Final    KLEBSIELLA OXYTOCA SUSCEPTIBILITIES TO FOLLOW Performed at [facility], [place] [address], [place], [place] [address]    Report Status PENDING  Incomplete  Blood Culture ID Panel (Reflexed)     Status:  Abnormal   Collection Time: 04/03/17  5:49 PM  Result Value Ref Range Status   Enterococcus  species NOT DETECTED NOT DETECTED Final   Listeria monocytogenes NOT DETECTED NOT DETECTED Final   Staphylococcus species NOT DETECTED NOT DETECTED Final   Staphylococcus aureus NOT DETECTED NOT DETECTED Final   Streptococcus species NOT DETECTED NOT DETECTED Final   Streptococcus agalactiae NOT DETECTED NOT DETECTED Final   Streptococcus pneumoniae NOT DETECTED NOT DETECTED Final   Streptococcus pyogenes NOT DETECTED NOT DETECTED Final   Acinetobacter baumannii NOT DETECTED NOT DETECTED Final   Enterobacteriaceae species DETECTED (A) NOT DETECTED Final    Comment: Enterobacteriaceae represent a large family of gram-negative bacteria, not a single organism. CRITICAL RESULT CALLED TO, READ BACK BY AND VERIFIED WITH: M LILLISTON,PHARMD AT 0919 04/04/17 BY L BENFIELD    Enterobacter cloacae complex NOT DETECTED NOT DETECTED Final   Escherichia coli NOT DETECTED NOT DETECTED Final   Klebsiella oxytoca DETECTED (A) NOT DETECTED Final    Comment: CRITICAL RESULT CALLED TO, READ BACK BY AND VERIFIED WITH: M LILLISTON,PHARMD AT 0919 04/04/17 BY L BENFIELD    Klebsiella pneumoniae NOT DETECTED NOT DETECTED Final   Proteus species NOT DETECTED NOT DETECTED Final   Serratia marcescens NOT DETECTED NOT DETECTED Final   Carbapenem resistance NOT DETECTED NOT DETECTED Final   Haemophilus influenzae NOT DETECTED NOT DETECTED Final   Neisseria meningitidis NOT DETECTED NOT DETECTED Final   Pseudomonas aeruginosa NOT DETECTED NOT DETECTED Final   Candida albicans NOT DETECTED NOT DETECTED Final   Candida glabrata NOT DETECTED NOT DETECTED Final   Candida krusei NOT DETECTED NOT DETECTED Final   Candida parapsilosis NOT DETECTED NOT DETECTED Final   Candida tropicalis NOT DETECTED NOT DETECTED Final    Comment: Performed at [facility], [place] [address] [place],  [address]      Radiology  Studies: US Renal  Result Date: 04/03/2017 CLINICAL DATA:  Renal injury. EXAM: RENAL / URINARY TRACT ULTRASOUND COMPLETE COMPARISON:  Ultrasound of January 17, 2017. FINDINGS: Right Kidney: Length: 11.3 cm. 1.7 cm simple cyst is seen in midpole. Echogenicity within normal limits. No mass or hydronephrosis visualized. Left Kidney: Not well-visualized, presumably due to body habitus. Bladder: Surgically removed. IMPRESSION: Simple right renal cyst is noted. Otherwise right kidney appears normal. Left kidney is not well-visualized, presumably due to body habitus. CT scan may be performed for further evaluation. Electronically Signed   By: Marijo Conception, M.D.   On: 04/03/2017 20:40   Dg Chest Port 1 View  Result Date: 04/03/2017 CLINICAL DATA:  Weakness for 3 days. EXAM: PORTABLE CHEST 1 VIEW COMPARISON:  Single-view of the chest 11/22/2015. CT chest 11/15/2015. PA and lateral chest 11/14/2015. FINDINGS: Mild, chronic scar in the right base is unchanged. Lungs otherwise clear. Heart size is normal. No pneumothorax or pleural fluid. Aortic atherosclerosis is noted. No acute bony abnormality. IMPRESSION: No acute disease. Atherosclerosis. Electronically Signed   By: Inge Rise M.D.   On: 04/03/2017 16:14     Scheduled Meds: . amiodarone  200 mg Oral QHS  . atorvastatin  80 mg Oral q1800  . heparin  5,000 Units Subcutaneous Q8H  . insulin aspart  0-9 Units Subcutaneous TID WC  . insulin glargine  10 Units Subcutaneous QHS  . pantoprazole  40 mg Oral Daily  . sodium bicarbonate  1,300 mg Oral BID   Continuous Infusions: . cefTRIAXone (ROCEPHIN)  IV      Marzetta Board, MD, PhD [facility] Pager [phone]   If 7PM-7AM, please contact night-coverage [email or web address] Password [facility] 04/05/2017, 11:34 AM

## 2017-04-05 NOTE — Evaluation (Signed)
Physical Therapy Evaluation Patient Details Name: [name] MRN: [id number] DOB: November 29, 1940 Today's Date: 04/05/2017   History of Present Illness  Pt is a 76 yo female with hx of bladder cancer s/p bladder removal / total hystrectomy with urostomy bag placed 4 years ago complicated with UTIs, comes to the hospital with cc of fatigue, poor appetite, nausea for the past 2-3 days with mild epigastric discomfort w/o fever/chills.  Pt admitted with sepsis.  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 Pt currently with functional limitations due to the deficits listed below (see PT Problem List).  Pt will benefit from skilled PT to increase their independence and safety with mobility to allow discharge to the venue listed below.  Pt is quite fatigued during PT eval.  She was apprehensive about getting up, but once up she felt better and was appreciative of PT's assistance.  Recommend SNF and pt is in agreement.     Follow Up Recommendations SNF    Equipment Recommendations  None recommended by PT    Recommendations for Other Services       Precautions / Restrictions Precautions Precautions: Fall Precaution Comments: R BKA Restrictions Weight Bearing Restrictions: No      Mobility  Bed Mobility Overal bed mobility: Needs Assistance Bed Mobility: Supine to Sit     Supine to sit: +2 for physical assistance;HOB elevated;Mod assist     General bed mobility comments: Pt able to initiate transfer, but needed MOD of 2 to complete.  A (due to fatigue) with donning prosthesis at EOB  Transfers Overall transfer level: Needs assistance   Transfers: Lateral/Scoot Transfers          Lateral/Scoot Transfers: Mod assist;+2 physical assistance General transfer comment: Pt felt too weak to attempt a SPT, so performed lateral scoot transfer to drop arm recliner with bed  pads  Ambulation/Gait             General Gait Details: not tested  Stairs            Wheelchair Mobility    Modified Rankin (Stroke Patients Only)       Balance Overall balance assessment: Needs assistance   Sitting balance-Leahy Scale: Fair                                       Pertinent Vitals/Pain Pain Assessment: Faces Faces Pain Scale: Hurts even more Pain Location: "all over" Pain Descriptors / Indicators: Sore Pain Intervention(s): Monitored during session    Home Living Family/patient expects to be discharged to:: Private residence Living Arrangements: Children     Home Access: Ramped entrance     Home Layout: One level Home Equipment: Environmental consultant - 2 wheels;Bedside commode;Wheelchair - manual      Prior Function Level of Independence: Independent         Comments: SPT bed <> w/c with prosthesis on and w/c arm rests intact     Hand Dominance   Dominant Hand: Right    Extremity/Trunk Assessment   Upper Extremity Assessment Upper Extremity Assessment: Generalized weakness    Lower Extremity Assessment Lower Extremity Assessment: Generalized weakness;RLE deficits/detail RLE Deficits / Details: R BKA       Communication   Communication: No difficulties  Cognition Arousal/Alertness: Awake/alert Behavior During Therapy: WFL for tasks assessed/performed Overall  Cognitive Status: Within Functional Limits for tasks assessed                                        General Comments General comments (skin integrity, edema, etc.): Pt reports feeling swollen.  Pt's UE swollen in  hands    Exercises     Assessment/Plan    PT Assessment Patient needs continued PT services  PT Problem List Decreased strength;Decreased activity tolerance;Decreased balance;Decreased mobility;Cardiopulmonary status limiting activity       PT Treatment Interventions DME instruction;Functional mobility training;Balance  training;Therapeutic exercise;Therapeutic activities    PT Goals (Current goals can be found in the Care Plan section)  Acute Rehab PT Goals Patient Stated Goal: go to rehab PT Goal Formulation: With patient Time For Goal Achievement: 04/19/17 Potential to Achieve Goals: Good    Frequency Min 3X/week   Barriers to discharge Decreased caregiver support      Co-evaluation               AM-PAC PT "6 Clicks" Daily Activity  Outcome Measure Difficulty turning over in bed (including adjusting bedclothes, sheets and blankets)?: A Lot Difficulty moving from lying on back to sitting on the side of the bed? : Unable Difficulty sitting down on and standing up from a chair with arms (e.g., wheelchair, bedside commode, etc,.)?: Unable Help needed moving to and from a bed to chair (including a wheelchair)?: A Lot Help needed walking in hospital room?: Total Help needed climbing 3-5 steps with a railing? : Total 6 Click Score: 8    End of Session Equipment Utilized During Treatment: Gait belt Activity Tolerance: Patient tolerated treatment well Patient left: in chair;with call bell/phone within reach;with chair alarm set Nurse Communication: Mobility status PT Visit Diagnosis: Muscle weakness (generalized) (M62.81);Other abnormalities of gait and mobility (R26.89)    Time: 6789-3810 PT Time Calculation (min) (ACUTE ONLY): 25 min   Charges:   PT Evaluation $PT Eval Moderate Complexity: 1 Mod PT Treatments $Therapeutic Activity: 8-22 mins   PT G Codes:        Najmah Carradine L. Tamala Julian, [place] Pager [phone] 04/05/2017   Galen Manila 04/05/2017, 12:57 PM

## 2017-04-05 NOTE — Consult Note (Addendum)
Renal Service Consult Note [place] 04/05/2017 [place] Requesting Physician:  Dr Cruzita Lederer  Reason for Consult:  Acute on CRF HPI: The patient is a 76 y.o. year-old with hx of DM2, PAD / R BKA, HTN, bladder cancer rx with cystectomy w/ ileal conduit Jan 2014. Had R BKA in 2008.  Lives w/ daughter who is also amputee.  Hx CAD w stent, back surg, fem-pop bypass on L leg.  Patient admitted for gen'd weakness, abd pain, acute / CRF and UTI/ fever on 8/23.  Blood cx's are growing Klebsiella.  UCx neg. WBC 20-25k.   Creat was 4.1 on admit, 4.2 yest and is now 4.8.  Baseline creat here was 1.3- 1.7 in 2017. Asked to see for A/ CRF.   Patient very nauseous and uncomfortable with pain diffusely.  No SOB, cough, no nsaid use.    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  Got divorced in 2008 after 59 yrs or marriage and one dtr.  Retired in 2008 as well.  Now lives in [place] from [place] in a small house w/ her daughter.    She requests no heroic measures, "I'm ready to go".        ROS  denies CP  no joint pain   no HA  no blurry vision  no rash  no diarrhea   Past Medical History  Past Medical History:  Diagnosis Date  . Anemia   . Blood transfusion   . CAD (coronary artery disease) 01/26/2013   Evidently circumflex stent in 2014 following bladder cancer surgery for old myocardial infarction.   . CKD (chronic kidney disease) stage 3, GFR 30-59 ml/min 01/26/2013  . GERD (gastroesophageal reflux disease)   . Glaucoma   . History of atrial tachycardia   . History of bladder cancer TCC AND CIS  . Hyperlipidemia   . Hypertension   . Macular degeneration of both eyes   . Myocardial infarction ([place])   . Obesity (BMI 30-39.9)   . Peripheral neuropathy LEGS AND HANDS  . Peripheral vascular  disease (HCC) S/P RIGHT BKA  . Presence of urostomy ([place])   . Retinopathy due to secondary diabetes mellitus ([place])   . 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 Type 2 diabetes mellitus with vascular disease [facility])    Past Surgical History  Past Surgical History:  Procedure Laterality Date  . ABDOMINAL ANGIOGRAM N/A 09/08/2014   Procedure: ABDOMINAL ANGIOGRAM;  Surgeon: Conrad Wacissa, MD;  Location: [facility] CATH LAB;  Service: Cardiovascular;  Laterality: N/A;  . ABDOMINAL HYSTERECTOMY    . AMPUTATION Left 09/14/2014   Procedure: LEFT FOOT FIRST RAY AMPUTATION;  Surgeon: Mcarthur Rossetti, MD;  Location: [place];  Service: Orthopedics;  Laterality: Left;  . APPENDECTOMY  1963  . [place]  . BELOW KNEE LEG AMPUTATION  05-02-2007   RIGHT  . CATARACT EXTRACTION W/ INTRAOCULAR LENS  IMPLANT, BILATERAL    . CORONARY STENT PLACEMENT    . CYSTO / RESECTION BLADDER BX'S  04-01-11  &  11-26-10  . CYSTOSCOPY WITH BIOPSY  10/14/2011   Procedure: CYSTOSCOPY WITH BIOPSY;  Surgeon: Claybon Jabs, MD;  Location: [facility];  Service: Urology;  Laterality: N/A;  BLADDER BIOPSY  .  FEMORAL-POPLITEAL BYPASS GRAFT Left 09/12/2014   Procedure:  LEFT FEMORAL-POPLITEAL ARTERY BYPASS GRAFT USING NON REVERSE LEFT GREATER SAPHENOUS VEIN;  Surgeon: Rosetta Posner, MD;  Location: [place];  Service: Vascular;  Laterality: Left;  . I & D RIGHT BELOW KNEE AMPUTATION WOUND  06-10-2011  . LAPAROSCOPIC CHOLECYSTECTOMY  12-10-1999  . REVISION UROSTOMY CUTANEOUS    . REVISION UROSTOMY CUTANEOUS    . RIGHT FOOT I & D / REMOVAL NECROTIC BONE  JUN  &  AUG 2008  . RIGHT GREAT TOE AMPUTATION  11-05-2006   Family History  Family History  Problem Relation Age of Onset  . Heart failure Mother        died age 30  . Sudden death Father 61  . Heart disease Father   . Hypertension Father   . Heart attack Father   . Diabetes Brother   . Heart attack Brother   . AAA (abdominal aortic  aneurysm) Brother   . Heart disease Brother    Social History  reports that she quit smoking about 41 years ago. Her smoking use included Cigarettes. She has a 34.00 pack-year smoking history. She has never used smokeless tobacco. She reports that she drinks alcohol. She reports that she does not use drugs. Allergies  Allergies  Allergen Reactions  . Gabapentin Other (See Comments)    Extremely drunk and lethargic  . Lisinopril Other (See Comments)    Increased creatinine levels (pt currently taking low dose)  . Metformin And Related Other (See Comments)    Increased creatinine levels  . Biaxin [Clarithromycin] Itching   Home medications Prior to Admission medications   Medication Sig Start Date End Date Taking? Authorizing Provider  acetaminophen (TYLENOL) 500 MG tablet Take 1,000 mg by mouth 2 (two) times daily as needed for mild pain or fever.    Yes [provider]  amiodarone (PACERONE) 200 MG tablet Take 1 tablet by mouth daily Patient taking differently: Take 200 mg by mouth at bedtime.  12/30/16  Yes Jerline Pain, MD  atorvastatin (LIPITOR) 80 MG tablet TAKE 1 TABLET (80 MG TOTAL) BY MOUTH EVERY EVENING. 05/31/16  Yes Jerline Pain, MD  calcitRIOL (ROCALTROL) 0.25 MCG capsule Take 0.25 mcg by mouth at bedtime.  08/03/16  Yes Deterding, Jeneen Rinks, MD  ferrous sulfate 325 (65 FE) MG tablet Take 325 mg by mouth at bedtime.   Yes [provider]  furosemide (LASIX) 40 MG tablet Take 80 mg by mouth 2 (two) times daily.    Yes [provider]  isosorbide mononitrate (IMDUR) 30 MG 24 hr tablet TAKE ONE TABLET BY MOUTH DAILY **MUST CALL MD AT [phone] FOR APPOINTMENT Patient taking differently: TAKE ONE TABLET BY MOUTH in the evening **MUST CALL MD AT [phone] FOR APPOINTMENT 12/30/16  Yes Jerline Pain, MD  LANTUS SOLOSTAR 100 UNIT/ML Solostar Pen Inject 0-65 Units into the skin 2 (two) times daily. Dose depends on sliding scale. 01/24/17  Yes [provider]  Magnesium 400 MG TABS Take 400 mg by mouth at bedtime.    Yes [provider]  metoprolol succinate (TOPROL-XL) 100 MG 24 hr tablet TAKE 1 TABLET (100 MG TOTAL) BY MOUTH EVERY EVENING. TAKE WITH OR IMMEDIATELY FOLLOWING A MEAL. 02/17/17  Yes Jerline Pain, MD  Multiple Vitamins-Minerals (PRESERVISION AREDS PO) Take 1 capsule by mouth 2 (two) times daily.   Yes [provider]  nitroGLYCERIN (NITROSTAT) 0.6 MG SL tablet Take 0.6 mg by mouth every 5 (  five) minutes x 3 doses as needed.   Yes [provider]  NOVOLOG FLEXPEN 100 UNIT/ML FlexPen Inject 0-20 Units into the skin 2 (two) times daily before a meal. Use per sliding scale  08/23/16  Yes Leighton Ruff, MD  omeprazole (PRILOSEC) 20 MG capsule Take 20 mg by mouth every evening.    Yes [provider]  polyethylene glycol (MIRALAX / GLYCOLAX) packet Take 17 g by mouth at bedtime as needed for mild constipation.    Yes [provider]  potassium chloride SA (K-DUR,KLOR-CON) 20 MEQ tablet Take 20 mEq by mouth at bedtime.  08/16/13  Yes [provider]  senna (SENOKOT) 8.6 MG TABS tablet Take 2 tablets by mouth at bedtime as needed for mild constipation (([employer])).    Yes [provider]  sodium bicarbonate 650 MG tablet Take 1,300 mg by mouth 2 (two) times daily.   Yes [provider]  apixaban (ELIQUIS) 5 MG TABS tablet Take 1 tablet (5 mg total) by mouth 2 (two) times daily. Patient not taking: Reported on 04/03/2017 03/14/17   Jerline Pain, MD   Liver Function Tests  Recent Labs Lab 04/03/17 1627 04/04/17 0656 04/05/17 0529  AST 27 31 28   ALT 22 20 23   ALKPHOS 169* 135* 150*  BILITOT 0.6 0.5 0.3  PROT 7.8 5.9* 5.8*  ALBUMIN 2.4* 1.6* 1.5*   No results for input(s): LIPASE, AMYLASE in the last 168 hours. CBC  Recent Labs Lab 04/03/17 1627 04/04/17 0656 04/05/17 0529  WBC 26.8* 23.4* 20.0*  NEUTROABS 25.5*  --   --   HGB 10.0* 7.7*  7.3*  HCT 30.3* 24.3* 22.8*  MCV 76.1* 75.7* 77.6*  PLT 415* 308 606   Basic Metabolic Panel  Recent Labs Lab 04/03/17 1627 04/04/17 0656 04/05/17 0529  NA 129* 131* 130*  K 4.4 4.6 4.2  CL 95* 103 103  CO2 23 21* 19*  GLUCOSE 234* 135* 163*  BUN 88* 81* 81*  CREATININE 4.09* 4.21* 4.78*  CALCIUM 9.6 8.4* 8.1*   Iron/TIBC/Ferritin/ %Sat No results found for: IRON, TIBC, FERRITIN, IRONPCTSAT  Vitals:   04/05/17 0524 04/05/17 1244 04/05/17 1307 04/05/17 1425  BP: (!) 116/46  (!) 123/35 (!) 133/57  Pulse: 83  85   Resp: 18  18   Temp: 98.7 F (37.1 C)  98.5 F (36.9 C)   TempSrc: Oral  Oral   SpO2: 97% 97% 99%   Weight:   100.3 kg (221 lb 1.9 oz)   Height:       Exam Gen tearful, uncomfortable, not in distress No rash, cyanosis or gangrene Sclera anicteric, throat clear +JVD to angle of jaw Chest clear on R , rales L base RRR no MRG Abd soft ntnd no mass or ascites +bs, urostomy LLQ clear urine GU foley in place MS R BKA stump  Ext diffuse 1-2+ LE and dependent hip edema / no wounds or ulcers Neuro is alert, Ox 3 , nf  Home meds :  Amio, lipitor, lasix 80 bid, imdur, lantus 65 bid, toprol-XL, mg, Novolog flexpen, prilosec, miralax, KDur, senna, bicarb, Eliquis  Impression: 1.  Acute on CKD 3 - creat rising, vol overloaded.  +uremic symptoms.  Not dialysis candidate.  Pt thinks this "is my last".  Discussed EOL issues, she is DNR at home and requests the same here. Spoke with daughter on the phone who supports DNR as well.  She is requesting stronger pain medication, have d/w primary team.  Her AKI is likely due to sepsis/ ATN, but cannot r/o GN with blood / protein in urine.  Would not pursue GN w/u in this patient w/ sig chronic debility/ mult comorbidities. Will try IV lasix but not sure it will help much.  Will consult palliative care.   2.  Vol overload/ edema 3.  Hx of bladder cancer/ hx of cystectomy+ urostomy 4.  UTI/ sepsis - +blood cx's, on abx 5.  HTN -  BP med on hold (MTP) 6.  DM on insulin  7.  Afib on amio/ BB/ eliquis at home   Plan - IV lasix trial, changed to DNR.  Pall care consult.    Kelly Splinter MD [employer] pager [phone]   04/05/2017, 7:42 PM

## 2017-04-05 NOTE — Progress Notes (Addendum)
Lovey Newcomer, NP returned page. Stated that due to patients sepsis diagnosis that Lasix was to be held due to nephrotoxicity. Patient informed and verbalized understanding. Patient boosted in the bed and stated that her shortness of breath was better once boosted but would inform us if it did not help. Informed patient that she could speak with MD during rounds in AM.

## 2017-04-05 NOTE — Progress Notes (Signed)
Pharmacy Antibiotic Note  [name] is a 76 y.o. female admitted on 04/03/2017 with bacteremia and cellulits.  Pharmacy has been consulted for vancomycin and zosyn dosing. Pt with Kleb oxytoca bacteremia, likely d/t urinary source, cystectomy w/ nephrostomy bag x 4 yrs. Tmax 100.4 WBC 26.8>23.4, lactate 1.64, Cr 4.21, creat cl < 20 ml/min.  CXR clear, Renal US neg.  MD also concerned about cellulitis of LLE.  Vanc 1 gm given in ED 8/23 at [facility] -d/w Dr. Cruzita Lederer- he wants to cover LLE cellulitis with vanc and keep zosyn to cover Kleb in blood and cellulitis instead of narrowing to Rocephin for Kleb oxytoca  Plan: 1) Rise in SCr, d/c vanc 1g q48 dose, check vanc random level at 1700 this PM 2) Will reorder vanc pending level this PM 3) Continue Zosyn 2.25g IV q8 4) But with patient with klebsiella bacteremia, would recommend discontinue vancomycin anyway and narrow Zosyn when final sensitivities available - note that Rocephin 2g IV q24 is drug of choice per [facility] recommendations per [facility] team until final cx's known  Height: 5\' 3"  (160 cm) Weight: 209 lb 14.1 oz (95.2 kg) IBW/kg (Calculated) : 52.4  Temp (24hrs), Avg:98.9 F (37.2 C), Min:98.7 F (37.1 C), Max:99.1 F (37.3 C)   Recent Labs Lab 04/03/17 1627 04/03/17 1644 04/03/17 1905 04/04/17 0656 04/05/17 0529  WBC 26.8*  --   --  23.4* 20.0*  CREATININE 4.09*  --   --  4.21* 4.78*  LATICACIDVEN  --  2.81* 1.64  --   --     Estimated Creatinine Clearance: 11.2 mL/min (A) (by C-G formula based on SCr of 4.78 mg/dL (H)).    Allergies  Allergen Reactions  . Gabapentin Other (See Comments)    Extremely drunk and lethargic  . Lisinopril Other (See Comments)    Increased creatinine levels (pt currently taking low dose)  . Metformin And Related Other (See Comments)    Increased creatinine levels  . Biaxin [Clarithromycin] Itching   Antimicrobials this admission: 8/23 Vanc x 1 at 1732   8/24>> 8/23 Zosyn  >>   Dose adjustments this admission:   Microbiology results: 8/23 BCx:  BCID Klebsiella oxytoca 4/4 bottles- 2 sets 8/23 UCx:  Thank you for allowing pharmacy to be a part of this patient's care.  Adrian Saran, PharmD, BCPS Pager [phone] 04/05/2017 9:38 AM

## 2017-04-05 NOTE — Progress Notes (Addendum)
Called by Judson Roch, NT in to room.  Patient states that she has a heavy feeling in body and some trouble breathing due to not having lasix since being admitted.  Paged Lovey Newcomer, NP concerning issue.  Awaiting callback.

## 2017-04-05 NOTE — Consult Note (Signed)
[place] Nurse wound consult note Reason for Consult: Deep tissue pressure injury to sacrum, present on admission and incorrectly noted as ecchymosis. This is corrected today by staff/bedside RN. Wound type:Pressure Pressure Injury POA: Yes Measurement: 12cm x 10cm area of purple/maroon ecchymosis with two areas of serum-filled blistering at 12 o'clock and 4 o'clock. Wound bed:As described above Drainage (amount, consistency, odor) none Periwound:Intact, dry Dressing procedure/placement/frequency: Patient will be provided today with a mattress replacement with low air loss feature and a sacral silicone foam dressing will be placed. Patient is in agreement with the POC that includes side to side positioning, minimizing time spent in the supine position to meals.   [place] Nurse ostomy consult note Stoma type/location: RLQ ileal consult.  Well-established, long term.  Patient is assisted in the care of her urostomy by her daughter. Stomal assessment/size: visualized through pouch today, unable to measure.  New pouch was placed on admission. Peristomal assessment: Patient states that she has no irritation at this time Treatment options for stomal/peristomal skin: Skin barrier ring.  Patient uses [employer] brand rings.  She is aware that we stock [employer] rings and is amenable to using those if she does not have her own. Output: clear yellow urine Ostomy pouching: 2pc., 2 and 1/4 inch pouching system with skin barrier ring. Education provided: Patient reassured that nursing team will assist with pouching change should one become necessary and her daughter not be here. She has supplies for one pouch change from home and I tell her that the orders I have entered will guide the nursing staff in the event more are needed. Enrolled patient in [place] program: No. Patient is established with a supplier.  [place] nursing team will not follow, but will remain available to this patient, the nursing and  medical teams.  Please re-consult if needed. Thanks, Maudie Flakes, MSN, RN, [place], Arther Abbott  Pager# [phone]

## 2017-04-06 DIAGNOSIS — Z515 Encounter for palliative care: Secondary | ICD-10-CM

## 2017-04-06 DIAGNOSIS — Z7189 Other specified counseling: Secondary | ICD-10-CM

## 2017-04-06 LAB — BASIC METABOLIC PANEL
ANION GAP: 9 (ref 5–15)
BUN: 83 mg/dL — ABNORMAL HIGH (ref 6–20)
CHLORIDE: 102 mmol/L (ref 101–111)
CO2: 20 mmol/L — AB (ref 22–32)
Calcium: 8.5 mg/dL — ABNORMAL LOW (ref 8.9–10.3)
Creatinine, Ser: 4.66 mg/dL — ABNORMAL HIGH (ref 0.44–1.00)
GFR calc non Af Amer: 8 mL/min — ABNORMAL LOW (ref >60)
GFR, EST AFRICAN AMERICAN: 10 mL/min — AB (ref >60)
Glucose, Bld: 110 mg/dL — ABNORMAL HIGH (ref 65–99)
Potassium: 4 mmol/L (ref 3.5–5.1)
Sodium: 131 mmol/L — ABNORMAL LOW (ref 135–145)

## 2017-04-06 LAB — CBC
HEMATOCRIT: 24.5 % — AB (ref 36.0–46.0)
HEMOGLOBIN: 7.6 g/dL — AB (ref 12.0–15.0)
MCH: 24.1 pg — AB (ref 26.0–34.0)
MCHC: 31 g/dL (ref 30.0–36.0)
MCV: 77.5 fL — AB (ref 78.0–100.0)
Platelets: 308 10*3/uL (ref 150–400)
RBC: 3.16 MIL/uL — AB (ref 3.87–5.11)
RDW: 17.8 % — ABNORMAL HIGH (ref 11.5–15.5)
WBC: 16 10*3/uL — ABNORMAL HIGH (ref 4.0–10.5)

## 2017-04-06 LAB — CULTURE, BLOOD (ROUTINE X 2)

## 2017-04-06 LAB — GLUCOSE, CAPILLARY
GLUCOSE-CAPILLARY: 111 mg/dL — AB (ref 65–99)
GLUCOSE-CAPILLARY: 131 mg/dL — AB (ref 65–99)
GLUCOSE-CAPILLARY: 96 mg/dL (ref 65–99)
Glucose-Capillary: 103 mg/dL — ABNORMAL HIGH (ref 65–99)

## 2017-04-06 MED ORDER — FUROSEMIDE 10 MG/ML IJ SOLN
80.0000 mg | Freq: Three times a day (TID) | INTRAMUSCULAR | Status: DC
  Administered 2017-04-06 – 2017-04-08 (×6): 80 mg via INTRAVENOUS
  Filled 2017-04-06 (×6): qty 8

## 2017-04-06 MED ORDER — HYDROMORPHONE HCL-NACL 0.5-0.9 MG/ML-% IV SOSY
0.5000 mg | PREFILLED_SYRINGE | INTRAVENOUS | Status: DC | PRN
  Administered 2017-04-06 – 2017-04-13 (×12): 0.5 mg via INTRAVENOUS
  Filled 2017-04-06 (×12): qty 1

## 2017-04-06 NOTE — Consult Note (Addendum)
Consultation Note Date: 04/06/2017   Patient Name: [name]  DOB: 1940-11-10  MRN: [id number]  Age / Sex: 76 y.o., female  PCP: Leighton Ruff, MD Referring Physician: Caren Griffins, MD  Reason for Consultation: Establishing goals of care  HPI/Patient Profile: 76 y.o. female   admitted on 04/03/2017    Clinical Assessment and Goals of Care:  76 year old lady with a past medical history significant for diabetes bladder cancer status post cystectomy hypertension peripheral arterial disease status post right BKA lives at home with daughter who also has health conditions of her own. Also has history of coronary artery disease status post stent placement. Patient has been admitted with positive blood cultures, worsening abdominal pain acute on chronic renal failure urinary tract infection. Patient has been seen and evaluated by nephrology. A palliative consult is requested for additional goals of care discussions.  [name] is resting in bed. She is awake and alert. She states she is breathing better today. She believes the Lasix has helped. She has also received a few doses of IV morphine given on a when necessary basis. She states she rested well overnight. She is eating only 20-30% of her meals. I introduced myself and palliative care as follows: Palliative medicine is specialized medical care for people living with serious illness. It focuses on providing relief from the symptoms and stress of a serious illness. The goal is to improve quality of life for both the patient and the family.  Brief life review performed. Patient has lived in [place], [place] or her life. She has one daughter who is 82 years old. She is divorced. She has 1 grandson and has 2 great grandchildren. Patient states she believes she has lived a good life. She states that her life's passion was to play the piano Sunday in  her church. Patient spends a large portion of her visit reflecting on her childhood which she describes as a happy one.  Patient understands her serious medical conditions. Patient hopes for some degree of stabilization/recovery. Patient understands that she will soon require hospice services if her renal function continues to decline. At some point in the past she went to [facility] facility for rehabilitation. We discussed about how palliative care can help under the circumstances. All of her questions answered to the best of my ability. See discussions and recommendations below. Thank you for the consult.  NEXT OF KIN  lives with daughter.   SUMMARY OF RECOMMENDATIONS   DNR DNI re discussed and re confirmed in detail with patient.  Recommend SNF rehab with palliative on discharge, consider home with hospice after that.  Continue current mode of care, Lasix, IV Abx etc.  Will transition from IV Morphine to IV Dilaudid in light of the patient's ongoing renal insufficiency. Monitor renal function Thank you for the consult.   Code Status/Advance Care Planning:  DNR    Symptom Management:      Palliative Prophylaxis:   Bowel Regimen   Psycho-social/Spiritual:   Desire for further Chaplaincy support:yes  Additional Recommendations: Caregiving  Support/Resources  Prognosis:   Guarded, could be less than 6 months or much shorter than that, if her renal function continues to decline.   Discharge Planning: [place] for rehab with Palliative care service follow-up      Primary Diagnoses: Present on Admission: . Sepsis ([place]) . AKI (acute kidney injury) ([place]) . CAD (coronary artery disease) . History of bladder cancer . HTN (hypertension) . Obesity (BMI 30-39.9) . Paroxysmal atrial fibrillation (HCC) . Type 2 diabetes mellitus with vascular disease ([place])   I have reviewed the medical record, interviewed the patient and family, and examined the patient.  The following aspects are pertinent.  Past Medical History:  Diagnosis Date  . Anemia   . Blood transfusion   . CAD (coronary artery disease) 01/26/2013   Evidently circumflex stent in 2014 following bladder cancer surgery for old myocardial infarction.   . CKD (chronic kidney disease) stage 3, GFR 30-59 ml/min 01/26/2013  . GERD (gastroesophageal reflux disease)   . Glaucoma   . History of atrial tachycardia   . History of bladder cancer TCC AND CIS  . Hyperlipidemia   . Hypertension   . Macular degeneration of both eyes   . Myocardial infarction ([place])   . Obesity (BMI 30-39.9)   . Peripheral neuropathy LEGS AND HANDS  . Peripheral vascular disease (HCC) S/P RIGHT BKA  . 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 Retinopathy due to secondary diabetes mellitus ([place])   . 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 Type 2 diabetes mellitus with vascular disease [facility])    Social History   Social History  . Marital status: Divorced    Spouse name: N/A  . Number of children: N/A  . Years of education: N/A   Social History Main Topics  . Smoking status: Former Smoker    Packs/day: 2.00    Years: 17.00    Types: Cigarettes    Quit date: 10/09/1975  . Smokeless tobacco: Never Used  . Alcohol use 0.0 oz/week     Comment: RARE  . Drug use: No  . Sexual activity: Not Asked   Other Topics Concern  . None   Social History Narrative  . None   Family History  Problem Relation Age of Onset  . Heart failure Mother        died age 81  . Sudden death Father 93  . Heart disease Father   . Hypertension Father   . Heart attack Father   . Diabetes Brother   . Heart attack Brother   . AAA (abdominal aortic aneurysm) Brother   . Heart disease Brother    Scheduled Meds: . amiodarone  200 mg Oral QHS  . atorvastatin  80 mg Oral q1800  . furosemide  80 mg Intravenous Q12H  . heparin  5,000 Units Subcutaneous Q8H  . insulin aspart  0-9 Units Subcutaneous TID WC  . insulin  glargine  10 Units Subcutaneous QHS  . pantoprazole  40 mg Oral Daily  . sodium bicarbonate  1,300 mg Oral BID   Continuous Infusions: . cefTRIAXone (ROCEPHIN)  IV Stopped (04/05/17 1315)   PRN Meds:.acetaminophen, diphenhydrAMINE, morphine injection, ondansetron (ZOFRAN) IV, oxyCODONE-acetaminophen, polyethylene glycol, senna Medications Prior to Admission:  Prior to Admission medications   Medication Sig Start Date End Date Taking? Authorizing Provider  acetaminophen (TYLENOL) 500 MG tablet Take 1,000 mg by mouth 2 (two) times daily as needed for mild pain or fever.  Yes [provider]  amiodarone (PACERONE) 200 MG tablet Take 1 tablet by mouth daily Patient taking differently: Take 200 mg by mouth at bedtime.  12/30/16  Yes Jerline Pain, MD  atorvastatin (LIPITOR) 80 MG tablet TAKE 1 TABLET (80 MG TOTAL) BY MOUTH EVERY EVENING. 05/31/16  Yes Jerline Pain, MD  calcitRIOL (ROCALTROL) 0.25 MCG capsule Take 0.25 mcg by mouth at bedtime.  08/03/16  Yes Deterding, Jeneen Rinks, MD  ferrous sulfate 325 (65 FE) MG tablet Take 325 mg by mouth at bedtime.   Yes [provider]  furosemide (LASIX) 40 MG tablet Take 80 mg by mouth 2 (two) times daily.    Yes [provider]  isosorbide mononitrate (IMDUR) 30 MG 24 hr tablet TAKE ONE TABLET BY MOUTH DAILY **MUST CALL MD AT [phone] FOR APPOINTMENT Patient taking differently: TAKE ONE TABLET BY MOUTH in the evening **MUST CALL MD AT [phone] FOR APPOINTMENT 12/30/16  Yes Jerline Pain, MD  LANTUS SOLOSTAR 100 UNIT/ML Solostar Pen Inject 0-65 Units into the skin 2 (two) times daily. Dose depends on sliding scale. 01/24/17  Yes [provider]  Magnesium 400 MG TABS Take 400 mg by mouth at bedtime.    Yes [provider]  metoprolol succinate (TOPROL-XL) 100 MG 24 hr tablet TAKE 1 TABLET (100 MG TOTAL) BY MOUTH EVERY EVENING. TAKE WITH OR IMMEDIATELY FOLLOWING A MEAL. 02/17/17  Yes Jerline Pain, MD    Multiple Vitamins-Minerals (PRESERVISION AREDS PO) Take 1 capsule by mouth 2 (two) times daily.   Yes [provider]  nitroGLYCERIN (NITROSTAT) 0.6 MG SL tablet Take 0.6 mg by mouth every 5 (five) minutes x 3 doses as needed.   Yes [provider]  NOVOLOG FLEXPEN 100 UNIT/ML FlexPen Inject 0-20 Units into the skin 2 (two) times daily before a meal. Use per sliding scale  08/23/16  Yes Leighton Ruff, MD  omeprazole (PRILOSEC) 20 MG capsule Take 20 mg by mouth every evening.    Yes [provider]  polyethylene glycol (MIRALAX / GLYCOLAX) packet Take 17 g by mouth at bedtime as needed for mild constipation.    Yes [provider]  potassium chloride SA (K-DUR,KLOR-CON) 20 MEQ tablet Take 20 mEq by mouth at bedtime.  08/16/13  Yes [provider]  senna (SENOKOT) 8.6 MG TABS tablet Take 2 tablets by mouth at bedtime as needed for mild constipation (([employer])).    Yes [provider]  sodium bicarbonate 650 MG tablet Take 1,300 mg by mouth 2 (two) times daily.   Yes [provider]  apixaban (ELIQUIS) 5 MG TABS tablet Take 1 tablet (5 mg total) by mouth 2 (two) times daily. Patient not taking: Reported on 04/03/2017 03/14/17   Jerline Pain, MD   Allergies  Allergen Reactions  . Gabapentin Other (See Comments)    Extremely drunk and lethargic  . Lisinopril Other (See Comments)    Increased creatinine levels (pt currently taking low dose)  . Metformin And Related Other (See Comments)    Increased creatinine levels  . Biaxin [Clarithromycin] Itching   Review of Systems +feeling weak + less appetite  Physical Exam Awake alert Resting in bed S1 S2 Few rales Abdomen soft has urostomy Has R BKA Has edema of LLE Awake alert oriented  Vital Signs: BP (!) 128/50 (BP Location: Right Arm)   Pulse 84   Temp 98.4 F (36.9 C) (Oral)   Resp 18   Ht 5\' 3"  (1.6 m)  Wt 100.3 kg (221 lb 1.9 oz)   SpO2 96%   BMI 39.17 kg/m   Pain Assessment: 0-10   Pain Score: 0-No pain   SpO2: SpO2: 96 % O2 Device:SpO2: 96 % O2 Flow Rate: .   IO: Intake/output summary:  Intake/Output Summary (Last 24 hours) at 04/06/17 1023 Last data filed at 04/06/17 0830  Gross per 24 hour  Intake              290 ml  Output             1400 ml  Net            -1110 ml    LBM: Last BM Date: 04/04/17 Baseline Weight: Weight: 95.2 kg (209 lb 14.1 oz) Most recent weight: Weight: 100.3 kg (221 lb 1.9 oz)     Palliative Assessment/Data:   Flowsheet Rows     Most Recent Value  Intake Tab  Referral Department  Hospitalist  Unit at Time of Referral  Med/Surg Unit  Palliative Care Primary Diagnosis  Nephrology  Palliative Care Type  New Palliative care  Reason for referral  Clarify Goals of Care, Counsel Regarding Hospice  Date first seen by Palliative Care  04/06/17  Clinical Assessment  Palliative Performance Scale Score  40%  Pain Max last 24 hours  3  Pain Min Last 24 hours  2  Dyspnea Max Last 24 Hours  4  Dyspnea Min Last 24 hours  3  Nausea Max Last 24 Hours  2  Nausea Min Last 24 Hours  1  Anxiety Max Last 24 Hours  3  Anxiety Min Last 24 Hours  2  Psychosocial & Spiritual Assessment  Palliative Care Outcomes  Patient/Family meeting held?  Yes  Who was at the meeting?  patient herself who is decisional.   Palliative Care Outcomes  Clarified goals of care      Time In:  12 Time Out:  13.10 Time Total:   70 min  Greater than 50%  of this time was spent counseling and coordinating care related to the above assessment and plan.  Signed by: Loistine Chance, MD  [phone]  Please contact Palliative Medicine Team phone at [phone] for questions and concerns.  For individual provider: See Shea Evans

## 2017-04-06 NOTE — Progress Notes (Signed)
[facility] Progress Note  Subjective: breathing a little better, diuresing on lasix IV  Vitals:   04/05/17 1307 04/05/17 1425 04/05/17 2108 04/06/17 0559  BP: (!) 123/35 (!) 133/57 133/61 (!) 128/50  Pulse: 85  93 84  Resp: 18  18 18   Temp: 98.5 F (36.9 C)  97.6 F (36.4 C) 98.4 F (36.9 C)  TempSrc: Oral  Oral Oral  SpO2: 99%  99% 96%  Weight: 100.3 kg (221 lb 1.9 oz)     Height:        Inpatient medications: . amiodarone  200 mg Oral QHS  . atorvastatin  80 mg Oral q1800  . furosemide  80 mg Intravenous Q12H  . heparin  5,000 Units Subcutaneous Q8H  . insulin aspart  0-9 Units Subcutaneous TID WC  . insulin glargine  10 Units Subcutaneous QHS  . pantoprazole  40 mg Oral Daily  . sodium bicarbonate  1,300 mg Oral BID   . cefTRIAXone (ROCEPHIN)  IV Stopped (04/06/17 1158)   acetaminophen, diphenhydrAMINE, HYDROmorphone (DILAUDID) injection, ondansetron (ZOFRAN) IV, oxyCODONE-acetaminophen, polyethylene glycol, senna  Exam: Gen tearful, uncomfortable, not in distress No rash, cyanosis or gangrene Sclera anicteric, throat clear +JVD to angle of jaw Chest clear on R , rales L base RRR no MRG Abd soft ntnd no mass or ascites +bs, urostomy LLQ clear urine GU foley in place MS R BKA stump  Ext diffuse 1-2+ LE and dependent hip edema / no wounds or ulcers Neuro is alert, Ox 3 , nf  Home meds :  Amio, lipitor, lasix 80 bid, imdur, lantus 65 bid, toprol-XL, mg, Novolog flexpen, prilosec, miralax, KDur, senna, bicarb, Eliquis  Impression: 1.  Acute on CKD 3 - baseline creat 1.3- 1.7 from 2017 labs.  Creat 4 on admit, up to 4.8 yest and down today w/ diuresis to 4.6.  Not dialysis candidate.  Pall care consult appreciated.  Would continue diuresing as needed, 80- 120 mg IV lasix 2-3 times / day for now.   Was on po lasix 80 bid at home. May need slightly higher dose at dc if renal function not much better.  Will be available as needed.  Will sign off.  2.   Vol excess - as above 3.  Hx of bladder cancer/ hx of cystectomy+ urostomy 4.  UTI/ sepsis - +blood cx's, on abx 5.  HTN - BP med on hold (MTP) 6.  DM on insulin  7.  Afib on amio/ BB/ eliquis at home  Plan - as above   Kelly Splinter MD [place] pager [phone]   04/06/2017, 1:56 PM    Recent Labs Lab 04/04/17 0656 04/05/17 0529 04/06/17 0547  NA 131* 130* 131*  K 4.6 4.2 4.0  CL 103 103 102  CO2 21* 19* 20*  GLUCOSE 135* 163* 110*  BUN 81* 81* 83*  CREATININE 4.21* 4.78* 4.66*  CALCIUM 8.4* 8.1* 8.5*    Recent Labs Lab 04/03/17 1627 04/04/17 0656 04/05/17 0529  AST 27 31 28   ALT 22 20 23   ALKPHOS 169* 135* 150*  BILITOT 0.6 0.5 0.3  PROT 7.8 5.9* 5.8*  ALBUMIN 2.4* 1.6* 1.5*    Recent Labs Lab 04/03/17 1627 04/04/17 0656 04/05/17 0529 04/06/17 0547  WBC 26.8* 23.4* 20.0* 16.0*  NEUTROABS 25.5*  --   --   --   HGB 10.0* 7.7* 7.3* 7.6*  HCT 30.3* 24.3* 22.8* 24.5*  MCV 76.1* 75.7* 77.6* 77.5*  PLT 415* 308 298 308  Iron/TIBC/Ferritin/ %Sat No results found for: IRON, TIBC, FERRITIN, IRONPCTSAT   

## 2017-04-06 NOTE — NC FL2 (Signed)
[place] LEVEL OF CARE SCREENING TOOL     IDENTIFICATION  Patient Name: [name] Birthdate: 1940-08-18 Sex: female Admission Date (Current Location): 04/03/2017  [facility] and [place] Number:  Herbalist and Address:  [facility],  [place] [address] [place]      Provider Number: [id number]  Attending Physician Name and Address:  Caren Griffins, MD  Relative Name and Phone Number:       Current Level of Care: Hospital Recommended Level of Care: [place] Prior Approval Number:    Date Approved/Denied:   PASRR Number: [id number]  Discharge Plan: SNF    Current Diagnoses: Patient Active Problem List   Diagnosis Date Noted  . Pressure injury of skin 04/05/2017  . Sepsis ([place]) 04/03/2017  . Chronic anticoagulation 12/19/2015  . Controlled type 2 diabetes mellitus with diabetic nephropathy, with long-term current use of insulin ([place])   . New onset atrial fibrillation ([place])   . Sinus bradycardia   . Acute respiratory failure with hypoxia ([place])   . Cardiogenic shock ([place])   . Paroxysmal atrial fibrillation ([place]) 11/19/2015  . Atrial fibrillation with RVR ([place]) 11/18/2015  . AKI (acute kidney injury) ([place]) 11/18/2015  . Metabolic encephalopathy 38/18/2993  . Acute hyperkalemia 11/18/2015  . Abnormal urinalysis 11/18/2015  . SIRS (systemic inflammatory response syndrome) ([place]) 11/18/2015  . Hyperosmolar non-ketotic state in patient with type 2 diabetes mellitus ([place]) 11/18/2015  . Hyperkalemia   . Arterial hypotension   . Anemia 09/17/2014  . 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 Peripheral vascular disease ([place]) 08/24/2013  . Status post above knee amputation of right lower extremity ([place]) 08/24/2013  . Obesity (BMI 30-39.9)   . Dehydration 01/27/2013  . CAD (coronary artery disease) 01/26/2013  . CKD (chronic kidney disease) stage 3, GFR 30-59 ml/min 01/26/2013  . Type 2 diabetes mellitus with  vascular disease ([place])   . HTN (hypertension) 11/14/2012  . History of bladder cancer     Orientation RESPIRATION BLADDER Height & Weight     Self, Time, Situation, Place  Normal Continent Weight: 221 lb 1.9 oz (100.3 kg) Height:  5\' 3"  (160 cm)  BEHAVIORAL SYMPTOMS/MOOD NEUROLOGICAL BOWEL NUTRITION STATUS      Continent    AMBULATORY STATUS COMMUNICATION OF NEEDS Skin   Extensive Assist Verbally PU Stage and Appropriate Care PU Stage 1 Dressing: No Dressing                     Personal Care Assistance Level of Assistance  Bathing, Dressing Bathing Assistance: Maximum assistance   Dressing Assistance: Maximum assistance     Functional Limitations Info             SPECIAL CARE FACTORS FREQUENCY  PT (By licensed PT), OT (By licensed OT)     PT Frequency: 5x/wk OT Frequency: 5x/wk            Contractures      Additional Factors Info  Code Status, Allergies, Insulin Sliding Scale Code Status Info: DNR Allergies Info: Gabapentin, Lisinopril, Metformin And Related, Biaxin Clarithromycin   Insulin Sliding Scale Info: 3x/day       Current Medications (04/06/2017):  This is the current hospital active medication list Current Facility-Administered Medications  Medication Dose Route Frequency Provider Last Rate Last Dose  . acetaminophen (TYLENOL) tablet 1,000 mg  1,000 mg Oral BID PRN Gennaro Africa, MD   1,000 mg at 04/05/17 1005  . amiodarone (PACERONE) tablet  200 mg  200 mg Oral QHS Gennaro Africa, MD   200 mg at 04/05/17 2144  . atorvastatin (LIPITOR) tablet 80 mg  80 mg Oral q1800 Gennaro Africa, MD   80 mg at 04/05/17 1713  . cefTRIAXone (ROCEPHIN) 2 g in dextrose 5 % 50 mL IVPB  2 g Intravenous Q24H Caren Griffins, MD   Stopped at 04/05/17 1315  . diphenhydrAMINE (BENADRYL) capsule 25 mg  25 mg Oral QHS PRN Caren Griffins, MD      . furosemide (LASIX) injection 80 mg  80 mg Intravenous Q12H Roney Jaffe, MD   80 mg at 04/06/17 0930  . heparin injection  5,000 Units  5,000 Units Subcutaneous Q8H Gennaro Africa, MD   5,000 Units at 04/05/17 2133  . insulin aspart (novoLOG) injection 0-9 Units  0-9 Units Subcutaneous TID WC Gennaro Africa, MD   2 Units at 04/05/17 1713  . insulin glargine (LANTUS) injection 10 Units  10 Units Subcutaneous QHS Gennaro Africa, MD   10 Units at 04/05/17 2143  . morphine 2 MG/ML injection 2 mg  2 mg Intravenous Q3H PRN Lovey Newcomer T, NP   2 mg at 04/06/17 0603  . ondansetron (ZOFRAN) injection 4 mg  4 mg Intravenous Q6H PRN Gennaro Africa, MD   4 mg at 04/03/17 2132  . oxyCODONE-acetaminophen (PERCOCET/ROXICET) 5-325 MG per tablet 1 tablet  1 tablet Oral Q4H PRN Caren Griffins, MD   1 tablet at 04/05/17 1713  . pantoprazole (PROTONIX) EC tablet 40 mg  40 mg Oral Daily Gennaro Africa, MD   40 mg at 04/06/17 0930  . polyethylene glycol (MIRALAX / GLYCOLAX) packet 17 g  17 g Oral QHS PRN Gennaro Africa, MD      . senna [facility]) tablet 17.2 mg  2 tablet Oral QHS PRN Gennaro Africa, MD      . sodium bicarbonate tablet 1,300 mg  1,300 mg Oral BID Gennaro Africa, MD   1,300 mg at 04/06/17 0930     Discharge Medications: Please see discharge summary for a list of discharge medications.  Relevant Imaging Results:  Relevant Lab Results:   Additional Information SS#: [id number]  Geralynn Ochs, LCSW

## 2017-04-06 NOTE — Progress Notes (Signed)
PROGRESS NOTE  [name] MVH:[id number] DOB: 1940-11-04 DOA: 04/03/2017 PCP: Leighton Ruff, MD   LOS: 3 days   Brief Narrative / Interim history: Pt is a 76 yo female with hx of bladder cancer s/p bladder removal / total hystrectomy with urostomy bag placed 4 years ago complicated with UTIs, comes to the hospital with cc of fatigue, poor appetite, nausea for the past 2-3 days with mild epigastric discomfort w/o fever/chills. No diarrhea/constipation. No cough/chest pain/dyspnea. No other complaints. No open wounds.   Assessment & Plan: Active Problems:   History of bladder cancer   HTN (hypertension)   CAD (coronary artery disease)   Type 2 diabetes mellitus with vascular disease (HCC)   Status post above knee amputation of right lower extremity (HCC)   Obesity (BMI 30-39.9)   AKI (acute kidney injury) ([place])   Paroxysmal atrial fibrillation (HCC)   Chronic anticoagulation   Sepsis ([place])   Pressure injury of skin   Encounter for palliative care   Goals of care, counseling/discussion   Sepsis due to UTI / Klebsiella bacteremia -Patient's fever has resolved.  Her blood pressure is now stable -She is not getting diuresed S became erythematous with fluid resuscitation -Blood cultures with Klebsiella, narrow to ceftriaxone -Urine culture negative, however suspect UTI to be cause for her bacteremia.  She has no GI symptoms -Obtain surveillance cultures today -Leukocytosis improving today at 16  Acute kidney injury and chronic kidney disease stage III /fluid overload -Patient was intermittently hypotensive as well, she was fluid resuscitated admission.  She became fluid overloaded and was started on Lasix yesterday.  Given progressive renal failure nephrology was consulted.  She is not a dialysis candidate, nephrologist suggested that if she does not respond to Lasix to be considered for palliative care/hospice.  Palliative care consulted.  Anemia -Hemoglobin dropped to  7.7>>7.3 after initially being in the 10 range, this looks dilutional, no evidence of bleeding currently, closely monitor -Hemoglobin improving on its own to 7.6 today  Paroxysmal atrial fibrillation -patient's CHA2DS2-VASc Score for Stroke Risk is > 2.  She was initially on Xarelto however that was later changed to Eliquis.  Patient has noticed some hematuria in her urostomy back about a month ago and has not taken any Eliquis since.  She understands the risks of increasing chances of stroke.  Type 2 diabetes mellitus, poorly controlled, with renal vascular complications -Continue patient's Lantus as well as sliding scale.  Hemoglobin A1c 12 -Fasting CBG 150, continue current regimen  Peripheral arterial disease -Patient is status post BKA amputation on the right.  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  She had a calcaneal ulcer on the left who is now healing.  She has a small partial-thickness wound on the pretibial area on the left with surrounding erythema, wound care was consulted -she is being followed by Dr. Donnetta Hutching. She is status post left femoral to anterior tibial bypass in February 2016  Possible cellulitis left pretibial area -on antibiotics as of  Coronary artery disease -No reports of chest pain, most recent cardiac evaluation included a 2D echo in April 2017 which showed an EF of 65-70%, moderate LVH, normal wall motions  Hyponatremia -Likely due to dehydration, sodium improving with IV fluids, continue   DVT prophylaxis: heparin Code Status: Full code Family Communication: no family at bedside Disposition Plan: TBD  Consultants:   None   Procedures:   None   Antimicrobials:  Vancomycin 8/23 >> 8/25  Zosyn 8/23 >>  8/25  Subjective: -Feels a little bit better today, appreciates that her swelling in her hands is gone down  Objective: Vitals:   04/05/17 1307 04/05/17 1425 04/05/17 2108 04/06/17 0559  BP: (!) 123/35 (!) 133/57 133/61 (!)  128/50  Pulse: 85  93 84  Resp: 18  18 18   Temp: 98.5 F (36.9 C)  97.6 F (36.4 C) 98.4 F (36.9 C)  TempSrc: Oral  Oral Oral  SpO2: 99%  99% 96%  Weight: 100.3 kg (221 lb 1.9 oz)     Height:        Intake/Output Summary (Last 24 hours) at 04/06/17 1412 Last data filed at 04/06/17 0830  Gross per 24 hour  Intake              290 ml  Output              950 ml  Net             -660 ml   Filed Weights   04/03/17 2009 04/04/17 2003 04/05/17 1307  Weight: 95.2 kg (209 lb 14.1 oz) 95.2 kg (209 lb 14.1 oz) 100.3 kg (221 lb 1.9 oz)    Examination:  Vitals:   04/05/17 1307 04/05/17 1425 04/05/17 2108 04/06/17 0559  BP: (!) 123/35 (!) 133/57 133/61 (!) 128/50  Pulse: 85  93 84  Resp: 18  18 18   Temp: 98.5 F (36.9 C)  97.6 F (36.4 C) 98.4 F (36.9 C)  TempSrc: Oral  Oral Oral  SpO2: 99%  99% 96%  Weight: 100.3 kg (221 lb 1.9 oz)     Height:       Constitutional: NAD Eyes:  No scleral icterus ENMT: MMM Respiratory: CTA biL, no wheezing, no crackles Cardiovascular: RRR no MRG. 1+ LE edema Abdomen: Soft, nontender to palpation, urostomy bag in place Skin/MSK: Right BKA. Neurologic: no focal findings  Data Reviewed: I have independently reviewed following labs and imaging studies   CBC:  Recent Labs Lab 04/03/17 1627 04/04/17 0656 04/05/17 0529 04/06/17 0547  WBC 26.8* 23.4* 20.0* 16.0*  NEUTROABS 25.5*  --   --   --   HGB 10.0* 7.7* 7.3* 7.6*  HCT 30.3* 24.3* 22.8* 24.5*  MCV 76.1* 75.7* 77.6* 77.5*  PLT 415* 308 298 876   Basic Metabolic Panel:  Recent Labs Lab 04/03/17 1627 04/04/17 0656 04/05/17 0529 04/06/17 0547  NA 129* 131* 130* 131*  K 4.4 4.6 4.2 4.0  CL 95* 103 103 102  CO2 23 21* 19* 20*  GLUCOSE 234* 135* 163* 110*  BUN 88* 81* 81* 83*  CREATININE 4.09* 4.21* 4.78* 4.66*  CALCIUM 9.6 8.4* 8.1* 8.5*   GFR: Estimated Creatinine Clearance: 11.8 mL/min (A) (by C-G formula based on SCr of 4.66 mg/dL (H)). Liver Function  Tests:  Recent Labs Lab 04/03/17 1627 04/04/17 0656 04/05/17 0529  AST 27 31 28   ALT 22 20 23   ALKPHOS 169* 135* 150*  BILITOT 0.6 0.5 0.3  PROT 7.8 5.9* 5.8*  ALBUMIN 2.4* 1.6* 1.5*   No results for input(s): LIPASE, AMYLASE in the last 168 hours. No results for input(s): AMMONIA in the last 168 hours. Coagulation Profile: No results for input(s): INR, PROTIME in the last 168 hours. Cardiac Enzymes: No results for input(s): CKTOTAL, CKMB, CKMBINDEX, TROPONINI in the last 168 hours. BNP (last 3 results) No results for input(s): PROBNP in the last 8760 hours. HbA1C:  Recent Labs  04/05/17 0529  HGBA1C 12.0*   CBG:  Recent Labs Lab 04/05/17 1128 04/05/17 1702 04/05/17 2112 04/06/17 0720 04/06/17 1121  GLUCAP 155* 178* 188* 111* 103*   Lipid Profile: No results for input(s): CHOL, HDL, LDLCALC, TRIG, CHOLHDL, LDLDIRECT in the last 72 hours. Thyroid Function Tests: No results for input(s): TSH, T4TOTAL, FREET4, T3FREE, THYROIDAB in the last 72 hours. Anemia Panel: No results for input(s): VITAMINB12, FOLATE, FERRITIN, TIBC, IRON, RETICCTPCT in the last 72 hours. Urine analysis:    Component Value Date/Time   COLORURINE AMBER (A) 04/03/2017 1540   APPEARANCEUR CLOUDY (A) 04/03/2017 1540   LABSPEC 1.023 04/03/2017 1540   PHURINE 6.0 04/03/2017 1540   GLUCOSEU NEGATIVE 04/03/2017 1540   HGBUR LARGE (A) 04/03/2017 1540   BILIRUBINUR NEGATIVE 04/03/2017 1540   KETONESUR NEGATIVE 04/03/2017 1540   PROTEINUR >=300 (A) 04/03/2017 1540   UROBILINOGEN 0.2 09/12/2014 0949   NITRITE NEGATIVE 04/03/2017 1540   LEUKOCYTESUR MODERATE (A) 04/03/2017 1540   Sepsis Labs: Invalid input(s): PROCALCITONIN, LACTICIDVEN  Recent Results (from the past 240 hour(s))  Culture, Urine     Status: Abnormal   Collection Time: 04/03/17  3:40 PM  Result Value Ref Range Status   Specimen Description URINE, RANDOM  Final   Special Requests NONE  Final   Culture MULTIPLE SPECIES  PRESENT, SUGGEST RECOLLECTION (A)  Final   Report Status 04/05/2017 FINAL  Final  Blood Culture (routine x 2)     Status: Abnormal (Preliminary result)   Collection Time: 04/03/17  4:34 PM  Result Value Ref Range Status   Specimen Description BLOOD BLOOD RIGHT FOREARM  Final   Special Requests   Final    BOTTLES DRAWN AEROBIC AND ANAEROBIC Blood Culture adequate volume   Culture  Setup Time   Final    GRAM NEGATIVE RODS IN BOTH AEROBIC AND ANAEROBIC BOTTLES CRITICAL RESULT CALLED TO, READ BACK BY AND VERIFIED WITH: A RUNYON,PHARMD AT 12330 04/04/17 BY L BENFIELD    Culture (A)  Final    KLEBSIELLA OXYTOCA SUSCEPTIBILITIES PERFORMED ON PREVIOUS CULTURE WITHIN THE LAST 5 DAYS. Performed at [facility], [place] [address] [place], [place] [address]    Report Status PENDING  Incomplete  Blood Culture (routine x 2)     Status: Abnormal (Preliminary result)   Collection Time: 04/03/17  5:49 PM  Result Value Ref Range Status   Specimen Description BLOOD BLOOD LEFT FOREARM  Final   Special Requests   Final    BOTTLES DRAWN AEROBIC AND ANAEROBIC Blood Culture results may not be optimal due to an inadequate volume of blood received in culture bottles   Culture  Setup Time   Final    GRAM NEGATIVE RODS IN BOTH AEROBIC AND ANAEROBIC BOTTLES CRITICAL RESULT CALLED TO, READ BACK BY AND VERIFIED WITH: Irwin Brakeman AT 2694 04/04/17 BY L BENFIELD Performed at [facility], [place] [address], [place], [place] [address]    Culture KLEBSIELLA OXYTOCA (A)  Final   Report Status PENDING  Incomplete   Organism ID, Bacteria KLEBSIELLA OXYTOCA  Final      Susceptibility   Klebsiella oxytoca - MIC*    AMPICILLIN >=32 RESISTANT Resistant     CEFAZOLIN 8 SENSITIVE Sensitive     CEFEPIME <=1 SENSITIVE Sensitive     CEFTAZIDIME <=1 SENSITIVE Sensitive     CEFTRIAXONE <=1 SENSITIVE Sensitive     CIPROFLOXACIN <=0.25 SENSITIVE Sensitive     GENTAMICIN <=1 SENSITIVE Sensitive     IMIPENEM <=0.25  SENSITIVE Sensitive     TRIMETH/SULFA <=  20 SENSITIVE Sensitive     AMPICILLIN/SULBACTAM 16 INTERMEDIATE Intermediate     PIP/TAZO <=4 SENSITIVE Sensitive     Extended ESBL NEGATIVE Sensitive     * KLEBSIELLA OXYTOCA  Blood Culture ID Panel (Reflexed)     Status: Abnormal   Collection Time: 04/03/17  5:49 PM  Result Value Ref Range Status   Enterococcus species NOT DETECTED NOT DETECTED Final   Listeria monocytogenes NOT DETECTED NOT DETECTED Final   Staphylococcus species NOT DETECTED NOT DETECTED Final   Staphylococcus aureus NOT DETECTED NOT DETECTED Final   Streptococcus species NOT DETECTED NOT DETECTED Final   Streptococcus agalactiae NOT DETECTED NOT DETECTED Final   Streptococcus pneumoniae NOT DETECTED NOT DETECTED Final   Streptococcus pyogenes NOT DETECTED NOT DETECTED Final   Acinetobacter baumannii NOT DETECTED NOT DETECTED Final   Enterobacteriaceae species DETECTED (A) NOT DETECTED Final    Comment: Enterobacteriaceae represent a large family of gram-negative bacteria, not a single organism. CRITICAL RESULT CALLED TO, READ BACK BY AND VERIFIED WITH: M LILLISTON,PHARMD AT 0919 04/04/17 BY L BENFIELD    Enterobacter cloacae complex NOT DETECTED NOT DETECTED Final   Escherichia coli NOT DETECTED NOT DETECTED Final   Klebsiella oxytoca DETECTED (A) NOT DETECTED Final    Comment: CRITICAL RESULT CALLED TO, READ BACK BY AND VERIFIED WITH: M LILLISTON,PHARMD AT 0919 04/04/17 BY L BENFIELD    Klebsiella pneumoniae NOT DETECTED NOT DETECTED Final   Proteus species NOT DETECTED NOT DETECTED Final   Serratia marcescens NOT DETECTED NOT DETECTED Final   Carbapenem resistance NOT DETECTED NOT DETECTED Final   Haemophilus influenzae NOT DETECTED NOT DETECTED Final   Neisseria meningitidis NOT DETECTED NOT DETECTED Final   Pseudomonas aeruginosa NOT DETECTED NOT DETECTED Final   Candida albicans NOT DETECTED NOT DETECTED Final   Candida glabrata NOT DETECTED NOT DETECTED Final    Candida krusei NOT DETECTED NOT DETECTED Final   Candida parapsilosis NOT DETECTED NOT DETECTED Final   Candida tropicalis NOT DETECTED NOT DETECTED Final    Comment: Performed at [facility], [place] [address] [place], [place] [address]      Radiology Studies: No results found.   Scheduled Meds: . amiodarone  200 mg Oral QHS  . atorvastatin  80 mg Oral q1800  . furosemide  80 mg Intravenous Q8H  . heparin  5,000 Units Subcutaneous Q8H  . insulin aspart  0-9 Units Subcutaneous TID WC  . insulin glargine  10 Units Subcutaneous QHS  . pantoprazole  40 mg Oral Daily  . sodium bicarbonate  1,300 mg Oral BID   Continuous Infusions: . cefTRIAXone (ROCEPHIN)  IV Stopped (04/06/17 1158)    Marzetta Board, MD, PhD [facility] Pager [phone]   If 7PM-7AM, please contact night-coverage [email or web address] Password [email or web address] 04/06/2017, 2:12 PM

## 2017-04-06 NOTE — Clinical Social Work Note (Signed)
Clinical Social Work Assessment  Patient Details  Name: [name] MRN: [id number] Date of Birth: 1941-05-10  Date of referral:  04/06/17               Reason for consult:  Facility Placement, Discharge Planning                Permission sought to share information with:  Facility Sport and exercise psychologist, Family Supports Permission granted to share information::  Yes, Verbal Permission Granted  Name::     Alvino Blood  Agency::  SNF  Relationship::  Daughter, Friend  Contact Information:     Housing/Transportation Living arrangements for the past 2 months:  Single Family Home Source of Information:  Patient Patient Interpreter Needed:  None Criminal Activity/Legal Involvement Pertinent to Current Situation/Hospitalization:  No - Comment as needed Significant Relationships:  Adult Children, Friend Lives with:  Self Do you feel safe going back to the place where you live?  Yes Need for family participation in patient care:  No (Coment)  Care giving concerns:  Patient has been living at home but would benefit from receiving short term rehab at discharge prior to returning home.   Social Worker assessment / plan:  CSW met with patient and patient's friend, Hoyle Sauer, at bedside to discuss discharge planning. CSW explained recommendation for SNF at discharge, and patient indicated she had been at [facility] twice in the past and it worked out well for her. She would like to return, if able. CSW to fax out referral and confirm bed availability at [facility]. CSW to initiate insurance authorization and follow to facilitate transition to SNF when medically ready.  Employment status:  Retired Nurse, adult PT Recommendations:  [place] / Referral to community resources:     Patient/Family's Response to care:  Patient agreeable to SNF placement.  Patient/Family's Understanding of and Emotional Response to Diagnosis, Current  Treatment, and Prognosis:  Patient and friend discussed patient's long medical history and how they help keep each other moving forward when times get tough. Patient acknowledged that sometimes she gets weighed down by how much she has to go through, but that she is trying to stay hopeful that things will improve for her. Patient indicated understanding of CSW role in discharge planning and appreciative of help.  Emotional Assessment Appearance:  Appears stated age Attitude/Demeanor/Rapport:    Affect (typically observed):  Appropriate Orientation:  Oriented to Self, Oriented to Place, Oriented to  Time, Oriented to Situation Alcohol / Substance use:  Not Applicable Psych involvement (Current and /or in the community):  No (Comment)  Discharge Needs  Concerns to be addressed:  Care Coordination, Discharge Planning Concerns Readmission within the last 30 days:  No Current discharge risk:  Physical Impairment, Lives alone Barriers to Discharge:  Continued Medical Work up, [place], [place] 04/06/2017, 2:53 PM

## 2017-04-07 LAB — CBC
HCT: 23.3 % — ABNORMAL LOW (ref 36.0–46.0)
HEMOGLOBIN: 7.3 g/dL — AB (ref 12.0–15.0)
MCH: 23.9 pg — ABNORMAL LOW (ref 26.0–34.0)
MCHC: 31.3 g/dL (ref 30.0–36.0)
MCV: 76.1 fL — ABNORMAL LOW (ref 78.0–100.0)
PLATELETS: 294 10*3/uL (ref 150–400)
RBC: 3.06 MIL/uL — ABNORMAL LOW (ref 3.87–5.11)
RDW: 17.9 % — AB (ref 11.5–15.5)
WBC: 14.8 10*3/uL — ABNORMAL HIGH (ref 4.0–10.5)

## 2017-04-07 LAB — BASIC METABOLIC PANEL
Anion gap: 9 (ref 5–15)
BUN: 83 mg/dL — ABNORMAL HIGH (ref 6–20)
CALCIUM: 8.4 mg/dL — AB (ref 8.9–10.3)
CHLORIDE: 104 mmol/L (ref 101–111)
CO2: 19 mmol/L — ABNORMAL LOW (ref 22–32)
Creatinine, Ser: 4.48 mg/dL — ABNORMAL HIGH (ref 0.44–1.00)
GFR, EST AFRICAN AMERICAN: 10 mL/min — AB (ref >60)
GFR, EST NON AFRICAN AMERICAN: 9 mL/min — AB (ref >60)
Glucose, Bld: 116 mg/dL — ABNORMAL HIGH (ref 65–99)
Potassium: 4.1 mmol/L (ref 3.5–5.1)
SODIUM: 132 mmol/L — AB (ref 135–145)

## 2017-04-07 LAB — CULTURE, BLOOD (ROUTINE X 2): SPECIAL REQUESTS: ADEQUATE

## 2017-04-07 LAB — GLUCOSE, CAPILLARY
GLUCOSE-CAPILLARY: 174 mg/dL — AB (ref 65–99)
Glucose-Capillary: 108 mg/dL — ABNORMAL HIGH (ref 65–99)
Glucose-Capillary: 141 mg/dL — ABNORMAL HIGH (ref 65–99)
Glucose-Capillary: 200 mg/dL — ABNORMAL HIGH (ref 65–99)

## 2017-04-07 NOTE — Progress Notes (Signed)
Daily Progress Note   Patient Name: [name]       Date: 04/07/2017 DOB: April 26, 1941  Age: 76 y.o. MRN#: [id number] Attending Physician: Caren Griffins, MD Primary Care Physician: Leighton Ruff, MD Admit Date: 04/03/2017  Reason for Consultation/Follow-up: Establishing goals of care  Subjective: [name] is resting in bed. She has no complaints today. She states she feels much better, is breathing better, and is noticing wrinkles in her hands again.  Denies dyspnea Asking for ice chips  Length of Stay: 4  Current Medications: Scheduled Meds:  . amiodarone  200 mg Oral QHS  . atorvastatin  80 mg Oral q1800  . furosemide  80 mg Intravenous Q8H  . heparin  5,000 Units Subcutaneous Q8H  . insulin aspart  0-9 Units Subcutaneous TID WC  . insulin glargine  10 Units Subcutaneous QHS  . pantoprazole  40 mg Oral Daily  . sodium bicarbonate  1,300 mg Oral BID    Continuous Infusions: . cefTRIAXone (ROCEPHIN)  IV Stopped (04/07/17 1213)    PRN Meds: acetaminophen, diphenhydrAMINE, HYDROmorphone (DILAUDID) injection, ondansetron (ZOFRAN) IV, oxyCODONE-acetaminophen, polyethylene glycol, senna  Physical Exam  Constitutional: She is oriented to person, place, and time. She appears well-developed.  HENT:  Head: Normocephalic and atraumatic.  Cardiovascular:  Warm and dry  Pulmonary/Chest: Effort normal. No respiratory distress.  Abdominal: Soft.  Musculoskeletal: She exhibits edema.  Neurological: She is alert and oriented to person, place, and time.  Psychiatric: She has a normal mood and affect. Her behavior is normal.            Vital Signs: BP (!) 130/48 (BP Location: Right Arm)   Pulse 84   Temp 98.4 F (36.9 C) (Oral)   Resp 18   Ht 5\' 3"  (1.6 m)   Wt 100.3  kg (221 lb 1.9 oz)   SpO2 96%   BMI 39.17 kg/m  SpO2: SpO2: 96 % O2 Device: O2 Device: Not Delivered O2 Flow Rate:    Intake/output summary:  Intake/Output Summary (Last 24 hours) at 04/07/17 1529 Last data filed at 04/07/17 0730  Gross per 24 hour  Intake              480 ml  Output             1125 ml  Net             -  645 ml   LBM: Last BM Date: 04/02/17 Baseline Weight: Weight: 95.2 kg (209 lb 14.1 oz) Most recent weight: Weight: 100.3 kg (221 lb 1.9 oz)       Palliative Assessment/Data: 50%    Flowsheet Rows     Most Recent Value  Intake Tab  Referral Department  Hospitalist  Unit at Time of Referral  Med/Surg Unit  Palliative Care Primary Diagnosis  Nephrology  Palliative Care Type  New Palliative care  Reason for referral  Clarify Goals of Care, Counsel Regarding Hospice  Date first seen by Palliative Care  04/06/17  Clinical Assessment  Palliative Performance Scale Score  40%  Pain Max last 24 hours  3  Pain Min Last 24 hours  2  Dyspnea Max Last 24 Hours  4  Dyspnea Min Last 24 hours  3  Nausea Max Last 24 Hours  2  Nausea Min Last 24 Hours  1  Anxiety Max Last 24 Hours  3  Anxiety Min Last 24 Hours  2  Psychosocial & Spiritual Assessment  Palliative Care Outcomes  Patient/Family meeting held?  Yes  Who was at the meeting?  patient herself who is decisional.   Palliative Care Outcomes  Clarified goals of care      Patient Active Problem List   Diagnosis Date Noted  . Encounter for palliative care   . Goals of care, counseling/discussion   . Pressure injury of skin 04/05/2017  . Sepsis ([place]) 04/03/2017  . Chronic anticoagulation 12/19/2015  . Controlled type 2 diabetes mellitus with diabetic nephropathy, with long-term current use of insulin ([place])   . New onset atrial fibrillation ([place])   . Sinus bradycardia   . Acute respiratory failure with hypoxia ([place])   . Cardiogenic shock ([place])   . Paroxysmal atrial fibrillation ([place]) 11/19/2015  . Atrial  fibrillation with RVR ([place]) 11/18/2015  . AKI (acute kidney injury) ([place]) 11/18/2015  . Metabolic encephalopathy 41/32/4401  . Acute hyperkalemia 11/18/2015  . Abnormal urinalysis 11/18/2015  . SIRS (systemic inflammatory response syndrome) ([place]) 11/18/2015  . Hyperosmolar non-ketotic state in patient with type 2 diabetes mellitus ([place]) 11/18/2015  . Hyperkalemia   . Arterial hypotension   . Anemia 09/17/2014  . 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 Peripheral vascular disease ([place]) 08/24/2013  . Status post above knee amputation of right lower extremity ([place]) 08/24/2013  . Obesity (BMI 30-39.9)   . Dehydration 01/27/2013  . CAD (coronary artery disease) 01/26/2013  . CKD (chronic kidney disease) stage 3, GFR 30-59 ml/min 01/26/2013  . Type 2 diabetes mellitus with vascular disease ([place])   . HTN (hypertension) 11/14/2012  . History of bladder cancer     Palliative Care Assessment & Plan   Patient Profile:  76 year old lady with a past medical history significant for diabetes bladder cancer status post cystectomy hypertension peripheral arterial disease status post right BKA lives at home with daughter who also has health conditions of her own. Also has history of coronary artery disease status post stent placement. Patient has been admitted with positive blood cultures, worsening abdominal pain acute on chronic renal failure urinary tract infection. Patient has been seen and evaluated by nephrology. A palliative consult is requested for additional goals of care discussions.   Assessment: [name] is feeling and breathing better. Her creatinine is trending down. Patient still hopes for some degree of stabilization/recovery.  Recommendations/Plan:  Continue current treatment plan including Lasix.   Will continue to follow to determine palliative medicine needs.  Goals of Care and Additional Recommendations:  Limitations on Scope of Treatment: DNR  Code Status:    Code  Status Orders        Start     Ordered   04/05/17 2008  Do not attempt resuscitation (DNR)  Continuous    Question Answer Comment  In the event of cardiac or respiratory ARREST Do not call a "code blue"   In the event of cardiac or respiratory ARREST Do not perform Intubation, CPR, defibrillation or ACLS   In the event of cardiac or respiratory ARREST Use medication by any route, position, wound care, and other measures to relive pain and suffering. May use oxygen, suction and manual treatment of airway obstruction as needed for comfort.      04/05/17 2007    Code Status History    Date Active Date Inactive Code Status Order ID Comments User Context   04/03/2017  6:47 PM 04/05/2017  8:07 PM Full Code [id number]  Gennaro Africa, MD ED   11/18/2015  1:46 PM 11/27/2015  6:13 PM Full Code [id number]  Samella Parr, NP Inpatient   09/12/2014  5:30 PM 09/20/2014  6:43 PM Full Code [id number]  Gabriel Earing, PA-C Inpatient   09/08/2014 12:53 PM 09/08/2014  7:46 PM Full Code [id number]  Conrad Crewe, MD Inpatient   01/26/2013  5:10 PM 01/28/2013  9:17 PM Full Code [id number]  Sheila Oats, MD Inpatient   11/14/2012  4:35 AM 11/18/2012  5:09 PM Full Code [id number]  Etta Quill., DO ED       Prognosis:   Unable to determine This depends on how she respond to Lasix and treatments.   Discharge Planning:  SNF with palliative care. Would consider home hospice after that time if she is a candidate.   Thank you for allowing the Palliative Medicine Team to assist in the care of this patient.   Time In: 15:00 Time Out: 15:35 Total Time 35 min Prolonged Time Billed No       Greater than 50%  of this time was spent counseling and coordinating care related to the above assessment and plan.  Asencion Gowda, NP Loistine Chance MD [phone])  Please contact Palliative Medicine Team phone at [phone] for questions and concerns.

## 2017-04-07 NOTE — Progress Notes (Addendum)
PROGRESS NOTE  [name] UDJ:[id number] DOB: April 13, 1941 DOA: 04/03/2017 PCP: Leighton Ruff, MD   LOS: 4 days   Brief Narrative / Interim history: Pt is a 76 yo female with hx of bladder cancer s/p bladder removal / total hystrectomy with urostomy bag placed 4 years ago complicated with UTIs, comes to the hospital with cc of fatigue, poor appetite, nausea for the past 2-3 days with mild epigastric discomfort w/o fever/chills. No diarrhea/constipation. No cough/chest pain/dyspnea. No other complaints. No open wounds.   Assessment & Plan: Active Problems:   History of bladder cancer   HTN (hypertension)   CAD (coronary artery disease)   Type 2 diabetes mellitus with vascular disease (HCC)   Status post above knee amputation of right lower extremity (HCC)   Obesity (BMI 30-39.9)   AKI (acute kidney injury) ([place])   Paroxysmal atrial fibrillation (HCC)   Chronic anticoagulation   Sepsis ([place])   Pressure injury of skin   Encounter for palliative care   Goals of care, counseling/discussion   Sepsis due to UTI / Klebsiella bacteremia -Patient's fever has resolved.  Her blood pressure is now stable -She is not getting diuresed since she became erythematous with fluid resuscitation -Blood cultures with Klebsiella, narrow to ceftriaxone -Urine culture negative, however suspect UTI to be cause for her bacteremia.  She has no GI symptoms -Surveillance cultures sent on 8/26, no growth today -Leukocytosis improving today to 14  Acute kidney injury and chronic kidney disease stage III /fluid overload -Patient was intermittently hypotensive as well, she was fluid resuscitated admission.  She became fluid overloaded and was started on Lasix.  Given progressive renal failure nephrology was consulted.  She is not a dialysis candidate, nephrologist suggested that if she does not respond to Lasix to be considered for palliative care/hospice.  Palliative care consulted. -Continue Lasix, kidney  function is stable  Anemia -Hemoglobin dropped to 7.7>>7.3 after initially being in the 10 range, this looks dilutional, no evidence of bleeding currently, closely monitor -Hemoglobin stable  Paroxysmal atrial fibrillation -patient's CHA2DS2-VASc Score for Stroke Risk is > 2.  She was initially on Xarelto however that was later changed to Eliquis.  Patient has noticed some hematuria in her urostomy back about a month ago and has not taken any Eliquis since.  She understands the risks of increasing chances of stroke.  Type 2 diabetes mellitus, poorly controlled, with renal vascular complications -Continue patient's Lantus as well as sliding scale.  Hemoglobin A1c 12 -Fasting CBG 10 weight, continue current regimen  Peripheral arterial disease -Patient is status post BKA amputation on the right.  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  She had a calcaneal ulcer on the left who is now healing.  She has a small partial-thickness wound on the pretibial area on the left with surrounding erythema, wound care was consulted -she is being followed by Dr. Donnetta Hutching. She is status post left femoral to anterior tibial bypass in February 2016  Possible cellulitis left pretibial area -on antibiotics as above  Coronary artery disease -No reports of chest pain, most recent cardiac evaluation included a 2D echo in April 2017 which showed an EF of 65-70%, moderate LVH, normal wall motions  Hyponatremia -Initially due to dehydration, now with hyponatremia in the setting of fluid overload  Obesity    DVT prophylaxis: heparin Code Status: Full code Family Communication: no family at bedside Disposition Plan: TBD SNF 2-3 days  Consultants:   None   Procedures:  None   Antimicrobials:  Vancomycin 8/23 >> 8/25  Zosyn 8/23 >>8/25  Subjective: -Feels better, swelling is down.  No chest pain or shortness of breath.  No abdominal pain nausea vomiting.  Objective: Vitals:    04/05/17 2108 04/06/17 0559 04/06/17 2126 04/07/17 0500  BP: 133/61 (!) 128/50 (!) 136/44 (!) 108/40  Pulse: 93 84 86 84  Resp: 18 18 18 18   Temp: 97.6 F (36.4 C) 98.4 F (36.9 C) 98.7 F (37.1 C) 98.2 F (36.8 C)  TempSrc: Oral Oral Oral Oral  SpO2: 99% 96% 97% 97%  Weight:      Height:        Intake/Output Summary (Last 24 hours) at 04/07/17 1215 Last data filed at 04/07/17 0730  Gross per 24 hour  Intake              480 ml  Output             1125 ml  Net             -645 ml   Filed Weights   04/03/17 2009 04/04/17 2003 04/05/17 1307  Weight: 95.2 kg (209 lb 14.1 oz) 95.2 kg (209 lb 14.1 oz) 100.3 kg (221 lb 1.9 oz)    Examination:  Vitals:   04/05/17 2108 04/06/17 0559 04/06/17 2126 04/07/17 0500  BP: 133/61 (!) 128/50 (!) 136/44 (!) 108/40  Pulse: 93 84 86 84  Resp: 18 18 18 18   Temp: 97.6 F (36.4 C) 98.4 F (36.9 C) 98.7 F (37.1 C) 98.2 F (36.8 C)  TempSrc: Oral Oral Oral Oral  SpO2: 99% 96% 97% 97%  Weight:      Height:       Constitutional: NAD Eyes: No scleral icterus, lids and conjunctivae normal ENMT: Moist mucous membranes Respiratory: Clear to auscultation, no crackles heard Cardiovascular: Regular rate and rhythm without murmurs.  1+ lower extremity edema. Abdomen: Soft, nontender, bowel sounds positive, urostomy bag in place Skin/MSK: Right BKA Neurologic: Nonfocal  Data Reviewed: I have independently reviewed following labs and imaging studies   CBC:  Recent Labs Lab 04/03/17 1627 04/04/17 0656 04/05/17 0529 04/06/17 0547 04/07/17 0511  WBC 26.8* 23.4* 20.0* 16.0* 14.8*  NEUTROABS 25.5*  --   --   --   --   HGB 10.0* 7.7* 7.3* 7.6* 7.3*  HCT 30.3* 24.3* 22.8* 24.5* 23.3*  MCV 76.1* 75.7* 77.6* 77.5* 76.1*  PLT 415* 308 298 308 127   Basic Metabolic Panel:  Recent Labs Lab 04/03/17 1627 04/04/17 0656 04/05/17 0529 04/06/17 0547 04/07/17 0511  NA 129* 131* 130* 131* 132*  K 4.4 4.6 4.2 4.0 4.1  CL 95* 103 103 102 104   CO2 23 21* 19* 20* 19*  GLUCOSE 234* 135* 163* 110* 116*  BUN 88* 81* 81* 83* 83*  CREATININE 4.09* 4.21* 4.78* 4.66* 4.48*  CALCIUM 9.6 8.4* 8.1* 8.5* 8.4*   GFR: Estimated Creatinine Clearance: 12.3 mL/min (A) (by C-G formula based on SCr of 4.48 mg/dL (H)). Liver Function Tests:  Recent Labs Lab 04/03/17 1627 04/04/17 0656 04/05/17 0529  AST 27 31 28   ALT 22 20 23   ALKPHOS 169* 135* 150*  BILITOT 0.6 0.5 0.3  PROT 7.8 5.9* 5.8*  ALBUMIN 2.4* 1.6* 1.5*   No results for input(s): LIPASE, AMYLASE in the last 168 hours. No results for input(s): AMMONIA in the last 168 hours. Coagulation Profile: No results for input(s): INR, PROTIME in the last 168 hours. Cardiac Enzymes: No results  for input(s): CKTOTAL, CKMB, CKMBINDEX, TROPONINI in the last 168 hours. BNP (last 3 results) No results for input(s): PROBNP in the last 8760 hours. HbA1C:  Recent Labs  04/05/17 0529  HGBA1C 12.0*   CBG:  Recent Labs Lab 04/06/17 1121 04/06/17 1652 04/06/17 2131 04/07/17 0734 04/07/17 1145  GLUCAP 103* 131* 96 108* 141*   Lipid Profile: No results for input(s): CHOL, HDL, LDLCALC, TRIG, CHOLHDL, LDLDIRECT in the last 72 hours. Thyroid Function Tests: No results for input(s): TSH, T4TOTAL, FREET4, T3FREE, THYROIDAB in the last 72 hours. Anemia Panel: No results for input(s): VITAMINB12, FOLATE, FERRITIN, TIBC, IRON, RETICCTPCT in the last 72 hours. Urine analysis:    Component Value Date/Time   COLORURINE AMBER (A) 04/03/2017 1540   APPEARANCEUR CLOUDY (A) 04/03/2017 1540   LABSPEC 1.023 04/03/2017 1540   PHURINE 6.0 04/03/2017 1540   GLUCOSEU NEGATIVE 04/03/2017 1540   HGBUR LARGE (A) 04/03/2017 1540   BILIRUBINUR NEGATIVE 04/03/2017 1540   KETONESUR NEGATIVE 04/03/2017 1540   PROTEINUR >=300 (A) 04/03/2017 1540   UROBILINOGEN 0.2 09/12/2014 0949   NITRITE NEGATIVE 04/03/2017 1540   LEUKOCYTESUR MODERATE (A) 04/03/2017 1540   Sepsis Labs: Invalid input(s):  PROCALCITONIN, LACTICIDVEN  Recent Results (from the past 240 hour(s))  Culture, Urine     Status: Abnormal   Collection Time: 04/03/17  3:40 PM  Result Value Ref Range Status   Specimen Description URINE, RANDOM  Final   Special Requests NONE  Final   Culture MULTIPLE SPECIES PRESENT, SUGGEST RECOLLECTION (A)  Final   Report Status 04/05/2017 FINAL  Final  Blood Culture (routine x 2)     Status: Abnormal   Collection Time: 04/03/17  4:34 PM  Result Value Ref Range Status   Specimen Description BLOOD BLOOD RIGHT FOREARM  Final   Special Requests   Final    BOTTLES DRAWN AEROBIC AND ANAEROBIC Blood Culture adequate volume   Culture  Setup Time   Final    GRAM NEGATIVE RODS IN BOTH AEROBIC AND ANAEROBIC BOTTLES CRITICAL RESULT CALLED TO, READ BACK BY AND VERIFIED WITH: A RUNYON,PHARMD AT 12330 04/04/17 BY L BENFIELD    Culture (A)  Final    KLEBSIELLA OXYTOCA SUSCEPTIBILITIES PERFORMED ON PREVIOUS CULTURE WITHIN THE LAST 5 DAYS. Performed at [facility], [place] [address], [place], [place] [address]    Report Status 04/07/2017 FINAL  Final  Blood Culture (routine x 2)     Status: Abnormal   Collection Time: 04/03/17  5:49 PM  Result Value Ref Range Status   Specimen Description BLOOD BLOOD LEFT FOREARM  Final   Special Requests   Final    BOTTLES DRAWN AEROBIC AND ANAEROBIC Blood Culture results may not be optimal due to an inadequate volume of blood received in culture bottles   Culture  Setup Time   Final    GRAM NEGATIVE RODS IN BOTH AEROBIC AND ANAEROBIC BOTTLES CRITICAL RESULT CALLED TO, READ BACK BY AND VERIFIED WITH: Irwin Brakeman AT 8453 04/04/17 BY L BENFIELD Performed at [facility], [place] [address], [place], [place] [address]    Culture KLEBSIELLA OXYTOCA (A)  Final   Report Status 04/06/2017 FINAL  Final   Organism ID, Bacteria KLEBSIELLA OXYTOCA  Final      Susceptibility   Klebsiella oxytoca - MIC*    AMPICILLIN >=32 RESISTANT Resistant      CEFAZOLIN 8 SENSITIVE Sensitive     CEFEPIME <=1 SENSITIVE Sensitive     CEFTAZIDIME <=1 SENSITIVE Sensitive  CEFTRIAXONE <=1 SENSITIVE Sensitive     CIPROFLOXACIN <=0.25 SENSITIVE Sensitive     GENTAMICIN <=1 SENSITIVE Sensitive     IMIPENEM <=0.25 SENSITIVE Sensitive     TRIMETH/SULFA <=20 SENSITIVE Sensitive     AMPICILLIN/SULBACTAM 16 INTERMEDIATE Intermediate     PIP/TAZO <=4 SENSITIVE Sensitive     Extended ESBL NEGATIVE Sensitive     * KLEBSIELLA OXYTOCA  Blood Culture ID Panel (Reflexed)     Status: Abnormal   Collection Time: 04/03/17  5:49 PM  Result Value Ref Range Status   Enterococcus species NOT DETECTED NOT DETECTED Final   Listeria monocytogenes NOT DETECTED NOT DETECTED Final   Staphylococcus species NOT DETECTED NOT DETECTED Final   Staphylococcus aureus NOT DETECTED NOT DETECTED Final   Streptococcus species NOT DETECTED NOT DETECTED Final   Streptococcus agalactiae NOT DETECTED NOT DETECTED Final   Streptococcus pneumoniae NOT DETECTED NOT DETECTED Final   Streptococcus pyogenes NOT DETECTED NOT DETECTED Final   Acinetobacter baumannii NOT DETECTED NOT DETECTED Final   Enterobacteriaceae species DETECTED (A) NOT DETECTED Final    Comment: Enterobacteriaceae represent a large family of gram-negative bacteria, not a single organism. CRITICAL RESULT CALLED TO, READ BACK BY AND VERIFIED WITH: M LILLISTON,PHARMD AT 0919 04/04/17 BY L BENFIELD    Enterobacter cloacae complex NOT DETECTED NOT DETECTED Final   Escherichia coli NOT DETECTED NOT DETECTED Final   Klebsiella oxytoca DETECTED (A) NOT DETECTED Final    Comment: CRITICAL RESULT CALLED TO, READ BACK BY AND VERIFIED WITH: M LILLISTON,PHARMD AT 0919 04/04/17 BY L BENFIELD    Klebsiella pneumoniae NOT DETECTED NOT DETECTED Final   Proteus species NOT DETECTED NOT DETECTED Final   Serratia marcescens NOT DETECTED NOT DETECTED Final   Carbapenem resistance NOT DETECTED NOT DETECTED Final   Haemophilus  influenzae NOT DETECTED NOT DETECTED Final   Neisseria meningitidis NOT DETECTED NOT DETECTED Final   Pseudomonas aeruginosa NOT DETECTED NOT DETECTED Final   Candida albicans NOT DETECTED NOT DETECTED Final   Candida glabrata NOT DETECTED NOT DETECTED Final   Candida krusei NOT DETECTED NOT DETECTED Final   Candida parapsilosis NOT DETECTED NOT DETECTED Final   Candida tropicalis NOT DETECTED NOT DETECTED Final    Comment: Performed at [facility], [place] [address], [place], [place] [address]  Culture, blood (Routine X 2) w Reflex to ID Panel     Status: None (Preliminary result)   Collection Time: 04/06/17 10:44 AM  Result Value Ref Range Status   Specimen Description BLOOD LEFT HAND  Final   Special Requests   Final    BOTTLES DRAWN AEROBIC ONLY Blood Culture adequate volume   Culture PENDING  Incomplete   Report Status PENDING  Incomplete      Radiology Studies: No results found.   Scheduled Meds: . amiodarone  200 mg Oral QHS  . atorvastatin  80 mg Oral q1800  . furosemide  80 mg Intravenous Q8H  . heparin  5,000 Units Subcutaneous Q8H  . insulin aspart  0-9 Units Subcutaneous TID WC  . insulin glargine  10 Units Subcutaneous QHS  . pantoprazole  40 mg Oral Daily  . sodium bicarbonate  1,300 mg Oral BID   Continuous Infusions: . cefTRIAXone (ROCEPHIN)  IV 2 g (04/07/17 1143)    Marzetta Board, MD, PhD [facility] Pager [phone]   If 7PM-7AM, please contact night-coverage [email or web address] Password [facility] 04/07/2017, 12:15 PM

## 2017-04-07 NOTE — Telephone Encounter (Signed)
I have faxed signed Eliquis RX to [employer] at [phone]. I did receive confirmation that the fax was successful.

## 2017-04-07 NOTE — Care Management Note (Signed)
Case Management Note  Patient Details  Name: [name] MRN: [id number] Date of Birth: Dec 24, 1940  Subjective/Objective:                  sepsis  Action/Plan: Date:  April 07, 2017  Chart reviewed for concurrent status and case management needs.  Will continue to follow patient progress.  Discharge Planning: following for needs  Expected discharge date: [id number]  Velva Harman, BSN, [place], [place]   Expected Discharge Date:   (unknown)               Expected Discharge Plan:  [place]  In-House Referral:     Discharge planning Services  CM Consult  Post Acute Care Choice:    Choice offered to:  Patient  DME Arranged:    DME Agency:     HH Arranged:    [place] Agency:     Status of Service:  In process, will continue to follow  If discussed at Long Length of Stay Meetings, dates discussed:    Additional Comments:  Leeroy Cha, RN 04/07/2017, 9:21 AM

## 2017-04-07 NOTE — Care Management Important Message (Signed)
Important Message  Patient Details  Name: [name] MRN: [id number] Date of Birth: 1941/01/17   Medicare Important Message Given:  Yes    Kerin Salen 04/07/2017, 11:21 [place] Message  Patient Details  Name: [name] MRN: [id number] Date of Birth: 03/10/1941   Medicare Important Message Given:  Yes    Kerin Salen 04/07/2017, 11:21 AM

## 2017-04-08 ENCOUNTER — Other Ambulatory Visit: Payer: Self-pay | Admitting: Pharmacist

## 2017-04-08 LAB — BASIC METABOLIC PANEL
ANION GAP: 10 (ref 5–15)
BUN: 84 mg/dL — AB (ref 6–20)
CHLORIDE: 101 mmol/L (ref 101–111)
CO2: 20 mmol/L — ABNORMAL LOW (ref 22–32)
Calcium: 8.6 mg/dL — ABNORMAL LOW (ref 8.9–10.3)
Creatinine, Ser: 4.44 mg/dL — ABNORMAL HIGH (ref 0.44–1.00)
GFR, EST AFRICAN AMERICAN: 10 mL/min — AB (ref >60)
GFR, EST NON AFRICAN AMERICAN: 9 mL/min — AB (ref >60)
GLUCOSE: 252 mg/dL — AB (ref 65–99)
Potassium: 4.4 mmol/L (ref 3.5–5.1)
SODIUM: 131 mmol/L — AB (ref 135–145)

## 2017-04-08 LAB — CBC
HEMATOCRIT: 24.4 % — AB (ref 36.0–46.0)
HEMOGLOBIN: 7.8 g/dL — AB (ref 12.0–15.0)
MCH: 24.7 pg — ABNORMAL LOW (ref 26.0–34.0)
MCHC: 32 g/dL (ref 30.0–36.0)
MCV: 77.2 fL — AB (ref 78.0–100.0)
Platelets: 286 10*3/uL (ref 150–400)
RBC: 3.16 MIL/uL — ABNORMAL LOW (ref 3.87–5.11)
RDW: 18.2 % — ABNORMAL HIGH (ref 11.5–15.5)
WBC: 12.3 10*3/uL — ABNORMAL HIGH (ref 4.0–10.5)

## 2017-04-08 LAB — GLUCOSE, CAPILLARY
GLUCOSE-CAPILLARY: 236 mg/dL — AB (ref 65–99)
GLUCOSE-CAPILLARY: 261 mg/dL — AB (ref 65–99)
Glucose-Capillary: 242 mg/dL — ABNORMAL HIGH (ref 65–99)
Glucose-Capillary: 237 mg/dL — ABNORMAL HIGH (ref 65–99)

## 2017-04-08 MED ORDER — FUROSEMIDE 10 MG/ML IJ SOLN
100.0000 mg | Freq: Three times a day (TID) | INTRAVENOUS | Status: DC
  Administered 2017-04-08 – 2017-04-12 (×11): 100 mg via INTRAVENOUS
  Filled 2017-04-08 (×14): qty 10

## 2017-04-08 NOTE — Progress Notes (Signed)
Physical Therapy Treatment Patient Details Name: [name] MRN: [id number] DOB: Mar 26, 1941 Today's Date: 04/08/2017    History of Present Illness Pt is a 76 yo female with hx of bladder cancer s/p bladder removal / total hystrectomy with urostomy bag placed 4 years ago complicated with UTIs, comes to the hospital with cc of fatigue, poor appetite, nausea for the past 2-3 days with mild epigastric discomfort w/o fever/chills.  Pt admitted with sepsis.  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  Once upright, pt able to static sit Indep.  Did require Min Assist to [facility] R prosthesis.  Assisted to drop arm recliner vis lateral scooting.  Pt demonstrated increased ability to self perform at [facility] Assist this session.  Pt also demonstrated increased awareness/cognition. Positioned in recliner to comfort.  Pt will need ST Rehab at SNF to regain prior level of mobility.     Follow Up Recommendations  SNF     Equipment Recommendations  None recommended by PT    Recommendations for Other Services       Precautions / Restrictions Precautions Precautions: Fall Precaution Comments: R BKA/has prosthesis Restrictions Weight Bearing Restrictions: No    Mobility  Bed Mobility Overal bed mobility: Needs Assistance Bed Mobility: Supine to Sit     Supine to sit: +2 for physical assistance;HOB elevated;Mod assist     General bed mobility comments: required elevated HOB and assist with upper body and complete scooting to EOB using bed pad.  Pt on Air Mattress this session.   Transfers Overall transfer level: Needs assistance   Transfers: Lateral/Scoot Transfers          Lateral/Scoot Transfers: Mod assist;+2 physical assistance General transfer comment: performed lateral scoot transfer to drop arm recliner  with bed pads with increased ability to self perform.   Ambulation/Gait                 Stairs            Wheelchair Mobility    Modified Rankin (Stroke Patients Only)       Balance                                            Cognition Arousal/Alertness: Awake/alert Behavior During Therapy: WFL for tasks assessed/performed Overall Cognitive Status: Within Functional Limits for tasks assessed                                 General Comments: pleasant      Exercises      General Comments        Pertinent Vitals/Pain Pain Assessment: Faces Faces Pain Scale: Hurts a little bit Pain Location: ABD Pain Descriptors / Indicators: Constant Pain Intervention(s): Monitored during session    Home Living                      Prior Function            PT Goals (current goals can now be found in the care plan section) Progress towards PT goals: Progressing toward goals    Frequency  Min 3X/week      PT Plan Current plan remains appropriate    Co-evaluation              AM-PAC PT "6 Clicks" Daily Activity  Outcome Measure  Difficulty turning over in bed (including adjusting bedclothes, sheets and blankets)?: A Lot Difficulty moving from lying on back to sitting on the side of the bed? : Unable Difficulty sitting down on and standing up from a chair with arms (e.g., wheelchair, bedside commode, etc,.)?: Unable Help needed moving to and from a bed to chair (including a wheelchair)?: Total Help needed walking in hospital room?: Total Help needed climbing 3-5 steps with a railing? : Total 6 Click Score: 7    End of Session Equipment Utilized During Treatment: Gait belt Activity Tolerance: Patient tolerated treatment well Patient left: in chair;with call bell/phone within reach;with chair alarm set Nurse Communication: Mobility status PT Visit Diagnosis: Muscle weakness (generalized) (M62.81);Other  abnormalities of gait and mobility (R26.89)     Time: 6712-4580 PT Time Calculation (min) (ACUTE ONLY): 13 min  Charges:  $Therapeutic Activity: 8-22 mins                    G Codes:       Rica Koyanagi  PTA [facility]  Acute  Rehab Pager      [phone]

## 2017-04-08 NOTE — Progress Notes (Addendum)
[name] VHQ:[id number] DOB: Dec 22, 1940 DOA: 04/03/2017 PCP: [name] Ruff, MD   LOS: 5 days   Brief Narrative / Interim history: Pt is a 76 yo female with hx of bladder cancer s/p bladder removal / total hystrectomy with urostomy bag placed 4 years ago complicated with UTIs, comes to the hospital with cc of fatigue, poor appetite, nausea for the past 2-3 days with mild epigastric discomfort w/o fever/chills. No diarrhea/constipation. No cough/chest pain/dyspnea. No other complaints. No open wounds.  Patient's hospital course has been complicated by progressive renal failure as well as decreased urine output.  Nephrology was consulted, and patient was deemed not a dialysis candidate, and she was placed on high-dose IV Lasix.  Palliative has been consulted as well.  She initially responded to high-dose IV Lasix, however on 8/28 her urine output seems to be decreasing and patient is beginning to look a bit more fluid overloaded.  We will increase the IV Lasix on 8/28.  Assessment & Plan: Active Problems:   History of bladder cancer   HTN (hypertension)   CAD (coronary artery disease)   Type 2 diabetes mellitus with vascular disease (HCC)   Status post above knee amputation of right lower extremity (HCC)   Obesity (BMI 30-39.9)   AKI (acute kidney injury) ([place])   Paroxysmal atrial fibrillation (HCC)   Chronic anticoagulation   Sepsis ([name])   Pressure injury of skin   Encounter for palliative care   Goals of care, counseling/discussion   Sepsis due to UTI / Klebsiella bacteremia -Patient's fever has resolved.  Her blood pressure is now stable -She was initially fluid resuscitated, and when her blood pressure stabilized she was started on IV furosemide -Blood cultures with Klebsiella, narrow to ceftriaxone -Urine culture negative, however suspect UTI to be cause for her bacteremia.  She has no GI symptoms -Surveillance cultures sent on 8/26, no growth  today -Leukocytosis improving today to 12.3  Acute kidney injury and chronic kidney disease stage III /fluid overload -Patient was intermittently hypotensive as well, she was fluid resuscitated admission.  She became fluid overloaded and was started on Lasix.  Given progressive renal failure nephrology was consulted.  She is not a dialysis candidate, nephrologist suggested that if she does not respond to Lasix to be considered for palliative care/hospice.  Palliative care consulted. -Continue Lasix, kidney function is overall stable however her urine output has decreased over the last 24 hours.  Will increase Lasix today.  If creatinine continues to lack improvement, becomes more edematous and urine output is not picking up, may be candidate at this point for hospice rather than SNF placement with hospice in the future  Anemia -Hemoglobin dropped to 7.7>>7.3 after initially being in the 10 range, this looks dilutional, no evidence of bleeding currently, closely monitor -Hemoglobin stable  Paroxysmal atrial fibrillation -patient's CHA2DS2-VASc Score for Stroke Risk is > 2.  She was initially on Xarelto however that was later changed to Eliquis.  Patient has noticed some hematuria in her urostomy back about a month ago and has not taken any Eliquis since.  She understands the risks of increasing chances of stroke.  Type 2 diabetes mellitus, poorly controlled, with renal vascular complications -Continue patient's Lantus as well as sliding scale.  Hemoglobin A1c 12 -Fasting CBG 242  Peripheral arterial disease -Patient is status post BKA amputation on the right.  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  She had a calcaneal ulcer on the left  who is now healing.  She has a small partial-thickness wound on the pretibial area on the left with surrounding erythema, wound care was consulted -she is being followed by Dr. Donnetta Hutching. She is status post left femoral to anterior tibial bypass in  February 2016  Possible cellulitis left pretibial area -on antibiotics as above, almost resolved  Coronary artery disease -No reports of chest pain, most recent cardiac evaluation included a 2D echo in April 2017 which showed an EF of 65-70%, moderate LVH, normal wall motions  Hyponatremia -Initially due to dehydration, now with hyponatremia in the setting of fluid overload  Obesity   Goals of care -Appreciate palliative care follow-up.  Discussed with Dr. Domingo Cocking today, if her urine output is not increasing with higher doses of Lasix and she is becoming more edematous/uremic, may be heading towards hospice soon as she is not a dialysis candidate  Deep tissue pressure injury to sacrum, present on admission -wound care   DVT prophylaxis: heparin Code Status: Full code Family Communication: no family at bedside Disposition Plan: TBD SNF vs hospice 2-3 days  Consultants:   Nephrology (signed off 8/26)  Palliative   Procedures:   None   Antimicrobials:  Vancomycin 8/23 >> 8/25  Zosyn 8/23 >>8/25  Ceftriaxone 8/25 >>  Subjective: -Feels a bit better, however complains of generalized weakness.  Still feels fluid overloaded.  No chest pain shortness of breath.  No abdominal pain nausea or vomiting.  Objective: Vitals:   04/07/17 0500 04/07/17 1454 04/07/17 2106 04/08/17 0459  BP: (!) 108/40 (!) 130/48 (!) 128/53 (!) 120/51  Pulse: 84 84 92 79  Resp: 18 18 20 20   Temp: 98.2 F (36.8 C) 98.4 F (36.9 C) 98.7 F (37.1 C) (!) 97.5 F (36.4 C)  TempSrc: Oral Oral Oral Oral  SpO2: 97% 96% 96% 98%  Weight:      Height:        Intake/Output Summary (Last 24 hours) at 04/08/17 1040 Last data filed at 04/08/17 0915  Gross per 24 hour  Intake              600 ml  Output             1125 ml  Net             -525 ml   Filed Weights   04/03/17 2009 04/04/17 2003 04/05/17 1307  Weight: 95.2 kg (209 lb 14.1 oz) 95.2 kg (209 lb 14.1 oz) 100.3 kg (221 lb 1.9 oz)     Examination:  Vitals:   04/07/17 0500 04/07/17 1454 04/07/17 2106 04/08/17 0459  BP: (!) 108/40 (!) 130/48 (!) 128/53 (!) 120/51  Pulse: 84 84 92 79  Resp: 18 18 20 20   Temp: 98.2 F (36.8 C) 98.4 F (36.9 C) 98.7 F (37.1 C) (!) 97.5 F (36.4 C)  TempSrc: Oral Oral Oral Oral  SpO2: 97% 96% 96% 98%  Weight:      Height:       Constitutional: NAD Eyes: no scleral icterus ENMT: MMM Respiratory: CTA biL, no crackles Cardiovascular: RRR, no murmurs. 1+ pitting LE edema Abdomen: soft, no tenderness to palpation, no guarding.  Urostomy bag in place Skin/MSK: Right BKA.  Cellulitic area on the left lower extremities improving Neurologic: No focal findings  Data Reviewed: I have independently reviewed following labs and imaging studies   CBC:  Recent Labs Lab 04/03/17 1627 04/04/17 0656 04/05/17 0529 04/06/17 0547 04/07/17 0511 04/08/17 0507  WBC 26.8* 23.4* 20.0*  16.0* 14.8* 12.3*  NEUTROABS 25.5*  --   --   --   --   --   HGB 10.0* 7.7* 7.3* 7.6* 7.3* 7.8*  HCT 30.3* 24.3* 22.8* 24.5* 23.3* 24.4*  MCV 76.1* 75.7* 77.6* 77.5* 76.1* 77.2*  PLT 415* 308 298 308 294 607   Basic Metabolic Panel:  Recent Labs Lab 04/04/17 0656 04/05/17 0529 04/06/17 0547 04/07/17 0511 04/08/17 0507  NA 131* 130* 131* 132* 131*  K 4.6 4.2 4.0 4.1 4.4  CL 103 103 102 104 101  CO2 21* 19* 20* 19* 20*  GLUCOSE 135* 163* 110* 116* 252*  BUN 81* 81* 83* 83* 84*  CREATININE 4.21* 4.78* 4.66* 4.48* 4.44*  CALCIUM 8.4* 8.1* 8.5* 8.4* 8.6*   GFR: Estimated Creatinine Clearance: 12.4 mL/min (A) (by C-G formula based on SCr of 4.44 mg/dL (H)). Liver Function Tests:  Recent Labs Lab 04/03/17 1627 04/04/17 0656 04/05/17 0529  AST 27 31 28   ALT 22 20 23   ALKPHOS 169* 135* 150*  BILITOT 0.6 0.5 0.3  PROT 7.8 5.9* 5.8*  ALBUMIN 2.4* 1.6* 1.5*   No results for input(s): LIPASE, AMYLASE in the last 168 hours. No results for input(s): AMMONIA in the last 168 hours. Coagulation  Profile: No results for input(s): INR, PROTIME in the last 168 hours. Cardiac Enzymes: No results for input(s): CKTOTAL, CKMB, CKMBINDEX, TROPONINI in the last 168 hours. BNP (last 3 results) No results for input(s): PROBNP in the last 8760 hours. HbA1C: No results for input(s): HGBA1C in the last 72 hours. CBG:  Recent Labs Lab 04/07/17 0734 04/07/17 1145 04/07/17 1658 04/07/17 2111 04/08/17 0726  GLUCAP 108* 141* 174* 200* 242*   Lipid Profile: No results for input(s): CHOL, HDL, LDLCALC, TRIG, CHOLHDL, LDLDIRECT in the last 72 hours. Thyroid Function Tests: No results for input(s): TSH, T4TOTAL, FREET4, T3FREE, THYROIDAB in the last 72 hours. Anemia Panel: No results for input(s): VITAMINB12, FOLATE, FERRITIN, TIBC, IRON, RETICCTPCT in the last 72 hours. Urine analysis:    Component Value Date/Time   COLORURINE AMBER (A) 04/03/2017 1540   APPEARANCEUR CLOUDY (A) 04/03/2017 1540   LABSPEC 1.023 04/03/2017 1540   PHURINE 6.0 04/03/2017 1540   GLUCOSEU NEGATIVE 04/03/2017 1540   HGBUR LARGE (A) 04/03/2017 1540   BILIRUBINUR NEGATIVE 04/03/2017 1540   KETONESUR NEGATIVE 04/03/2017 1540   PROTEINUR >=300 (A) 04/03/2017 1540   UROBILINOGEN 0.2 09/12/2014 0949   NITRITE NEGATIVE 04/03/2017 1540   LEUKOCYTESUR MODERATE (A) 04/03/2017 1540   Sepsis Labs: Invalid input(s): PROCALCITONIN, LACTICIDVEN  Recent Results (from the past 240 hour(s))  Culture, Urine     Status: Abnormal   Collection Time: 04/03/17  3:40 PM  Result Value Ref Range Status   Specimen Description URINE, RANDOM  Final   Special Requests NONE  Final   Culture MULTIPLE SPECIES PRESENT, SUGGEST RECOLLECTION (A)  Final   Report Status 04/05/2017 FINAL  Final  Blood Culture (routine x 2)     Status: Abnormal   Collection Time: 04/03/17  4:34 PM  Result Value Ref Range Status   Specimen Description BLOOD BLOOD RIGHT FOREARM  Final   Special Requests   Final    BOTTLES DRAWN AEROBIC AND ANAEROBIC Blood  Culture adequate volume   Culture  Setup Time   Final    GRAM NEGATIVE RODS IN BOTH AEROBIC AND ANAEROBIC BOTTLES CRITICAL RESULT CALLED TO, READ BACK BY AND VERIFIED WITH: A RUNYON,PHARMD AT 12330 04/04/17 BY L BENFIELD    Culture (A)  Final    KLEBSIELLA OXYTOCA SUSCEPTIBILITIES PERFORMED ON PREVIOUS CULTURE WITHIN THE LAST 5 DAYS. Performed at [facility], [place] [address], [place], [place] [address]    Report Status 04/07/2017 FINAL  Final  Blood Culture (routine x 2)     Status: Abnormal   Collection Time: 04/03/17  5:49 PM  Result Value Ref Range Status   Specimen Description BLOOD BLOOD LEFT FOREARM  Final   Special Requests   Final    BOTTLES DRAWN AEROBIC AND ANAEROBIC Blood Culture results may not be optimal due to an inadequate volume of blood received in culture bottles   Culture  Setup Time   Final    GRAM NEGATIVE RODS IN BOTH AEROBIC AND ANAEROBIC BOTTLES CRITICAL RESULT CALLED TO, READ BACK BY AND VERIFIED WITH: Irwin Brakeman AT 0175 04/04/17 BY L BENFIELD Performed at [facility], [place] [address], [place], [place] [address]    Culture KLEBSIELLA OXYTOCA (A)  Final   Report Status 04/06/2017 FINAL  Final   Organism ID, Bacteria KLEBSIELLA OXYTOCA  Final      Susceptibility   Klebsiella oxytoca - MIC*    AMPICILLIN >=32 RESISTANT Resistant     CEFAZOLIN 8 SENSITIVE Sensitive     CEFEPIME <=1 SENSITIVE Sensitive     CEFTAZIDIME <=1 SENSITIVE Sensitive     CEFTRIAXONE <=1 SENSITIVE Sensitive     CIPROFLOXACIN <=0.25 SENSITIVE Sensitive     GENTAMICIN <=1 SENSITIVE Sensitive     IMIPENEM <=0.25 SENSITIVE Sensitive     TRIMETH/SULFA <=20 SENSITIVE Sensitive     AMPICILLIN/SULBACTAM 16 INTERMEDIATE Intermediate     PIP/TAZO <=4 SENSITIVE Sensitive     Extended ESBL NEGATIVE Sensitive     * KLEBSIELLA OXYTOCA  Blood Culture ID Panel (Reflexed)     Status: Abnormal   Collection Time: 04/03/17  5:49 PM  Result Value Ref Range Status   Enterococcus  species NOT DETECTED NOT DETECTED Final   Listeria monocytogenes NOT DETECTED NOT DETECTED Final   Staphylococcus species NOT DETECTED NOT DETECTED Final   Staphylococcus aureus NOT DETECTED NOT DETECTED Final   Streptococcus species NOT DETECTED NOT DETECTED Final   Streptococcus agalactiae NOT DETECTED NOT DETECTED Final   Streptococcus pneumoniae NOT DETECTED NOT DETECTED Final   Streptococcus pyogenes NOT DETECTED NOT DETECTED Final   Acinetobacter baumannii NOT DETECTED NOT DETECTED Final   Enterobacteriaceae species DETECTED (A) NOT DETECTED Final    Comment: Enterobacteriaceae represent a large family of gram-negative bacteria, not a single organism. CRITICAL RESULT CALLED TO, READ BACK BY AND VERIFIED WITH: M LILLISTON,PHARMD AT 0919 04/04/17 BY L BENFIELD    Enterobacter cloacae complex NOT DETECTED NOT DETECTED Final   Escherichia coli NOT DETECTED NOT DETECTED Final   Klebsiella oxytoca DETECTED (A) NOT DETECTED Final    Comment: CRITICAL RESULT CALLED TO, READ BACK BY AND VERIFIED WITH: M LILLISTON,PHARMD AT 0919 04/04/17 BY L BENFIELD    Klebsiella pneumoniae NOT DETECTED NOT DETECTED Final   Proteus species NOT DETECTED NOT DETECTED Final   Serratia marcescens NOT DETECTED NOT DETECTED Final   Carbapenem resistance NOT DETECTED NOT DETECTED Final   Haemophilus influenzae NOT DETECTED NOT DETECTED Final   Neisseria meningitidis NOT DETECTED NOT DETECTED Final   Pseudomonas aeruginosa NOT DETECTED NOT DETECTED Final   Candida albicans NOT DETECTED NOT DETECTED Final   Candida glabrata NOT DETECTED NOT DETECTED Final   Candida krusei NOT DETECTED NOT DETECTED Final   Candida parapsilosis NOT DETECTED NOT DETECTED  Final   Candida tropicalis NOT DETECTED NOT DETECTED Final    Comment: Performed at [facility], [place] [address], [place], [place] [address]  Culture, blood (Routine X 2) w Reflex to ID Panel     Status: None (Preliminary result)   Collection Time: 04/06/17  10:44 AM  Result Value Ref Range Status   Specimen Description BLOOD RIGHT ANTECUBITAL  Final   Special Requests   Final    BOTTLES DRAWN AEROBIC AND ANAEROBIC Blood Culture adequate volume   Culture   Final    NO GROWTH < 24 HOURS Performed at [facility], [place] [address], [place], [place] [address]    Report Status PENDING  Incomplete  Culture, blood (Routine X 2) w Reflex to ID Panel     Status: None (Preliminary result)   Collection Time: 04/06/17 10:44 AM  Result Value Ref Range Status   Specimen Description BLOOD LEFT HAND  Final   Special Requests   Final    BOTTLES DRAWN AEROBIC ONLY Blood Culture adequate volume   Culture   Final    NO GROWTH < 24 HOURS Performed at [facility], [place] [address], [place], [place] [address]    Report Status PENDING  Incomplete      Radiology Studies: No results found.   Scheduled Meds: . amiodarone  200 mg Oral QHS  . atorvastatin  80 mg Oral q1800  . heparin  5,000 Units Subcutaneous Q8H  . insulin aspart  0-9 Units Subcutaneous TID WC  . insulin glargine  10 Units Subcutaneous QHS  . pantoprazole  40 mg Oral Daily  . sodium bicarbonate  1,300 mg Oral BID   Continuous Infusions: . cefTRIAXone (ROCEPHIN)  IV Stopped (04/07/17 1213)  . furosemide      Marzetta Board, MD, PhD [facility] Pager [phone]   If 7PM-7AM, please contact night-coverage [email or web address] Password [email or web address] 04/08/2017, 10:40 AM

## 2017-04-08 NOTE — Patient Outreach (Signed)
[facility]) Care Management  04/08/2017  [name] [birth year] [id number]   Patient is currently hospitalized for sepsis.  An attempt was made to call her on her cell phone but a recording came on stating "the person you are trying to reach is not accepting calls at this time".  As such, a voice mail could not be left for the patient.  An inbasket message was received from patient's cardiology office stating they had faxed the prescription to [facility] to complete the patient's application for Eliquis.  Plan:  Follow up with [place] and the patient  in 5-7 business days   [name], PharmD, [place] Clinical Pharmacist [phone]

## 2017-04-09 LAB — GLUCOSE, CAPILLARY
GLUCOSE-CAPILLARY: 244 mg/dL — AB (ref 65–99)
GLUCOSE-CAPILLARY: 233 mg/dL — AB (ref 65–99)
GLUCOSE-CAPILLARY: 238 mg/dL — AB (ref 65–99)
Glucose-Capillary: 228 mg/dL — ABNORMAL HIGH (ref 65–99)

## 2017-04-09 LAB — CBC
HEMATOCRIT: 23.7 % — AB (ref 36.0–46.0)
Hemoglobin: 7.6 g/dL — ABNORMAL LOW (ref 12.0–15.0)
MCH: 24.3 pg — AB (ref 26.0–34.0)
MCHC: 32.1 g/dL (ref 30.0–36.0)
MCV: 75.7 fL — AB (ref 78.0–100.0)
PLATELETS: 311 10*3/uL (ref 150–400)
RBC: 3.13 MIL/uL — AB (ref 3.87–5.11)
RDW: 17.8 % — ABNORMAL HIGH (ref 11.5–15.5)
WBC: 13.4 10*3/uL — AB (ref 4.0–10.5)

## 2017-04-09 LAB — BASIC METABOLIC PANEL
Anion gap: 10 (ref 5–15)
BUN: 81 mg/dL — AB (ref 6–20)
CHLORIDE: 100 mmol/L — AB (ref 101–111)
CO2: 20 mmol/L — AB (ref 22–32)
CREATININE: 4.07 mg/dL — AB (ref 0.44–1.00)
Calcium: 8.7 mg/dL — ABNORMAL LOW (ref 8.9–10.3)
GFR calc non Af Amer: 10 mL/min — ABNORMAL LOW (ref >60)
GFR, EST AFRICAN AMERICAN: 11 mL/min — AB (ref >60)
Glucose, Bld: 242 mg/dL — ABNORMAL HIGH (ref 65–99)
POTASSIUM: 4 mmol/L (ref 3.5–5.1)
Sodium: 130 mmol/L — ABNORMAL LOW (ref 135–145)

## 2017-04-09 MED ORDER — POLYETHYLENE GLYCOL 3350 17 G PO PACK
17.0000 g | PACK | Freq: Every day | ORAL | Status: DC
  Administered 2017-04-09 – 2017-04-13 (×5): 17 g via ORAL
  Filled 2017-04-09 (×5): qty 1

## 2017-04-09 MED ORDER — SENNA 8.6 MG PO TABS
2.0000 | ORAL_TABLET | Freq: Two times a day (BID) | ORAL | Status: DC
  Administered 2017-04-09 – 2017-04-13 (×9): 17.2 mg via ORAL
  Filled 2017-04-09 (×9): qty 2

## 2017-04-09 NOTE — Progress Notes (Signed)
Inpatient Diabetes Program Recommendations  AACE/ADA: New Consensus Statement on Inpatient Glycemic Control (2015)  Target Ranges:  Prepandial:   less than 140 mg/dL      Peak postprandial:   less than 180 mg/dL (1-2 hours)      Critically ill patients:  140 - 180 mg/dL   Lab Results  Component Value Date   GLUCAP 244 (H) 04/09/2017   HGBA1C 12.0 (H) 04/05/2017    Review of Glycemic Control  Blood sugars > 180 mg/dL. PO intake improving.  Inpatient Diabetes Program Recommendations:   Increase Lantus to 15 units QHS If post-prandials > 180 mg/dL, may need 2-3 units of Novolog tid.  Will follow.  Thank you. Lorenda Peck, RD, LDN, CDE Inpatient Diabetes Coordinator [phone]

## 2017-04-09 NOTE — Progress Notes (Signed)
PROGRESS NOTE  [name] DTO:[id number] DOB: 1940/09/16 DOA: 04/03/2017 PCP: Leighton Ruff, MD   LOS: 6 days   Brief Narrative / Interim history: Pt is a 76 yo female with hx of bladder cancer s/p bladder removal / total hystrectomy with urostomy bag placed 4 years ago complicated with UTIs, comes to the hospital with cc of fatigue, poor appetite, nausea for the past 2-3 days with mild epigastric discomfort w/o fever/chills. No diarrhea/constipation. No cough/chest pain/dyspnea. No other complaints. No open wounds.  Patient's hospital course has been complicated by progressive renal failure as well as decreased urine output.  Nephrology was consulted, and patient was deemed not a dialysis candidate, and she was placed on high-dose IV Lasix.  Palliative has been consulted as well.  She initially responded to high-dose IV Lasix, however on 8/28 her urine output seems to be decreasing and patient is beginning to look a bit more fluid overloaded.  We will increase the IV Lasix on 8/28.  Assessment & Plan: Active Problems:   History of bladder cancer   HTN (hypertension)   CAD (coronary artery disease)   Type 2 diabetes mellitus with vascular disease (HCC)   Status post above knee amputation of right lower extremity (HCC)   Obesity (BMI 30-39.9)   AKI (acute kidney injury) ([place])   Paroxysmal atrial fibrillation (HCC)   Chronic anticoagulation   Sepsis ([place])   Pressure injury of skin   Encounter for palliative care   Goals of care, counseling/discussion   Sepsis due to UTI / Klebsiella bacteremia -Patient's fever has resolved.  Her blood pressure is now stable -She was initially fluid resuscitated, and when her blood pressure stabilized she was started on IV furosemide -Blood cultures with Klebsiella, narrow to ceftriaxone -Urine culture negative, however suspect UTI to be cause for her bacteremia.  She has no GI symptoms -Surveillance cultures sent on 8/26, no growth today -WBC  at 13  Acute kidney injury and chronic kidney disease stage III /fluid overload -Patient was intermittently hypotensive as well, she was fluid resuscitated admission.  She became fluid overloaded and was started on Lasix.  Given progressive renal failure nephrology was consulted.  She is not a dialysis candidate, nephrologist suggested that if she does not respond to Lasix to be considered for palliative care/hospice.  Palliative care consulted. -continue with IV lasix, out put increase to 1.9 L. Cr at 4.   Anemia -Hemoglobin dropped to 7.7>>7.3 after initially being in the 10 range, this looks dilutional, no evidence of bleeding currently, closely monitor -Hemoglobin stable  Paroxysmal atrial fibrillation -patient's CHA2DS2-VASc Score for Stroke Risk is > 2.  She was initially on Xarelto however that was later changed to Eliquis.  Patient has noticed some hematuria in her urostomy back about a month ago and has not taken any Eliquis since.  She understands the risks of increasing chances of stroke. Unable to use eliquis due to renal failure.   Type 2 diabetes mellitus, poorly controlled, with renal vascular complications -Continue patient's Lantus as well as sliding scale.  Hemoglobin A1c 12 -Fasting CBG 242  Peripheral arterial disease -Patient is status post BKA amputation on the right.  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  She had a calcaneal ulcer on the left who is now healing.  She has a small partial-thickness wound on the pretibial area on the left with surrounding erythema, wound care was consulted -she is being followed by Dr. Donnetta Hutching. She is status post  left femoral to anterior tibial bypass in February 2016  Possible cellulitis left pretibial area -on antibiotics as above, almost resolved  Coronary artery disease -No reports of chest pain, most recent cardiac evaluation included a 2D echo in April 2017 which showed an EF of 65-70%, moderate LVH, normal  wall motions  Hyponatremia -Initially due to dehydration, now with hyponatremia in the setting of fluid overload  Obesity   Goals of care -Appreciate palliative care follow-up.  Discussed with Dr. Domingo Cocking today, if her urine output is not increasing with higher doses of Lasix and she is becoming more edematous/uremic, may be heading towards hospice soon as she is not a dialysis candidate  Deep tissue pressure injury to sacrum, present on admission -wound care   DVT prophylaxis: heparin Code Status: Full code Family Communication: no family at bedside Disposition Plan: TBD SNF vs hospice 2-3 days  Consultants:   Nephrology (signed off 8/26)  Palliative   Procedures:   None   Antimicrobials:  Vancomycin 8/23 >> 8/25  Zosyn 8/23 >>8/25  Ceftriaxone 8/25 >>  Subjective: She is feeling better, still with SOB on exertion and after she speak for long period of time.   Objective: Vitals:   04/09/17 0520 04/09/17 0928 04/09/17 1100 04/09/17 1430  BP: (!) 136/51 (!) 153/69  (!) 121/56  Pulse: 79 84  74  Resp: 20     Temp: 97.8 F (36.6 C) 97.8 F (36.6 C)  97.8 F (36.6 C)  TempSrc: Oral Oral  Oral  SpO2: 98%     Weight:   108.4 kg (239 lb)   Height:        Intake/Output Summary (Last 24 hours) at 04/09/17 1802 Last data filed at 04/09/17 1230  Gross per 24 hour  Intake              920 ml  Output              975 ml  Net              -55 ml   Filed Weights   04/04/17 2003 04/05/17 1307 04/09/17 1100  Weight: 95.2 kg (209 lb 14.1 oz) 100.3 kg (221 lb 1.9 oz) 108.4 kg (239 lb)    Examination:  Vitals:   04/09/17 0520 04/09/17 0928 04/09/17 1100 04/09/17 1430  BP: (!) 136/51 (!) 153/69  (!) 121/56  Pulse: 79 84  74  Resp: 20     Temp: 97.8 F (36.6 C) 97.8 F (36.6 C)  97.8 F (36.6 C)  TempSrc: Oral Oral  Oral  SpO2: 98%     Weight:   108.4 kg (239 lb)   Height:       Constitutional: NAD ENMT: MMM Respiratory: CTA biL, no  crackles Cardiovascular: RRR, no murmurs. 1+ pitting LE edema Abdomen: Soft, NT, ND Urostomy bag in place Skin/MSK: Right BKA.  Cellulitic area on the left lower extremities improved.    Data Reviewed: I have independently reviewed following labs and imaging studies   CBC:  Recent Labs Lab 04/03/17 1627  04/05/17 0529 04/06/17 0547 04/07/17 0511 04/08/17 0507 04/09/17 0512  WBC 26.8*  < > 20.0* 16.0* 14.8* 12.3* 13.4*  NEUTROABS 25.5*  --   --   --   --   --   --   HGB 10.0*  < > 7.3* 7.6* 7.3* 7.8* 7.6*  HCT 30.3*  < > 22.8* 24.5* 23.3* 24.4* 23.7*  MCV 76.1*  < > 77.6* 77.5* 76.1* 77.2*  75.7*  PLT 415*  < > 298 308 294 286 311  < > = values in this interval not displayed. Basic Metabolic Panel:  Recent Labs Lab 04/05/17 0529 04/06/17 0547 04/07/17 0511 04/08/17 0507 04/09/17 0512  NA 130* 131* 132* 131* 130*  K 4.2 4.0 4.1 4.4 4.0  CL 103 102 104 101 100*  CO2 19* 20* 19* 20* 20*  GLUCOSE 163* 110* 116* 252* 242*  BUN 81* 83* 83* 84* 81*  CREATININE 4.78* 4.66* 4.48* 4.44* 4.07*  CALCIUM 8.1* 8.5* 8.4* 8.6* 8.7*   GFR: Estimated Creatinine Clearance: 14.1 mL/min (A) (by C-G formula based on SCr of 4.07 mg/dL (H)). Liver Function Tests:  Recent Labs Lab 04/03/17 1627 04/04/17 0656 04/05/17 0529  AST 27 31 28   ALT 22 20 23   ALKPHOS 169* 135* 150*  BILITOT 0.6 0.5 0.3  PROT 7.8 5.9* 5.8*  ALBUMIN 2.4* 1.6* 1.5*   No results for input(s): LIPASE, AMYLASE in the last 168 hours. No results for input(s): AMMONIA in the last 168 hours. Coagulation Profile: No results for input(s): INR, PROTIME in the last 168 hours. Cardiac Enzymes: No results for input(s): CKTOTAL, CKMB, CKMBINDEX, TROPONINI in the last 168 hours. BNP (last 3 results) No results for input(s): PROBNP in the last 8760 hours. HbA1C: No results for input(s): HGBA1C in the last 72 hours. CBG:  Recent Labs Lab 04/08/17 1629 04/08/17 2046 04/09/17 0722 04/09/17 1110 04/09/17 1637   GLUCAP 261* 237* 244* 233* 238*   Lipid Profile: No results for input(s): CHOL, HDL, LDLCALC, TRIG, CHOLHDL, LDLDIRECT in the last 72 hours. Thyroid Function Tests: No results for input(s): TSH, T4TOTAL, FREET4, T3FREE, THYROIDAB in the last 72 hours. Anemia Panel: No results for input(s): VITAMINB12, FOLATE, FERRITIN, TIBC, IRON, RETICCTPCT in the last 72 hours. Urine analysis:    Component Value Date/Time   COLORURINE AMBER (A) 04/03/2017 1540   APPEARANCEUR CLOUDY (A) 04/03/2017 1540   LABSPEC 1.023 04/03/2017 1540   PHURINE 6.0 04/03/2017 1540   GLUCOSEU NEGATIVE 04/03/2017 1540   HGBUR LARGE (A) 04/03/2017 1540   BILIRUBINUR NEGATIVE 04/03/2017 1540   KETONESUR NEGATIVE 04/03/2017 1540   PROTEINUR >=300 (A) 04/03/2017 1540   UROBILINOGEN 0.2 09/12/2014 0949   NITRITE NEGATIVE 04/03/2017 1540   LEUKOCYTESUR MODERATE (A) 04/03/2017 1540   Sepsis Labs: Invalid input(s): PROCALCITONIN, LACTICIDVEN  Recent Results (from the past 240 hour(s))  Culture, Urine     Status: Abnormal   Collection Time: 04/03/17  3:40 PM  Result Value Ref Range Status   Specimen Description URINE, RANDOM  Final   Special Requests NONE  Final   Culture MULTIPLE SPECIES PRESENT, SUGGEST RECOLLECTION (A)  Final   Report Status 04/05/2017 FINAL  Final  Blood Culture (routine x 2)     Status: Abnormal   Collection Time: 04/03/17  4:34 PM  Result Value Ref Range Status   Specimen Description BLOOD BLOOD RIGHT FOREARM  Final   Special Requests   Final    BOTTLES DRAWN AEROBIC AND ANAEROBIC Blood Culture adequate volume   Culture  Setup Time   Final    GRAM NEGATIVE RODS IN BOTH AEROBIC AND ANAEROBIC BOTTLES CRITICAL RESULT CALLED TO, READ BACK BY AND VERIFIED WITH: A RUNYON,PHARMD AT 12330 04/04/17 BY L BENFIELD    Culture (A)  Final    KLEBSIELLA OXYTOCA SUSCEPTIBILITIES PERFORMED ON PREVIOUS CULTURE WITHIN THE LAST 5 DAYS. Performed at [facility], [place] [address], [place], [place]  [address]  Report Status 04/07/2017 FINAL  Final  Blood Culture (routine x 2)     Status: Abnormal   Collection Time: 04/03/17  5:49 PM  Result Value Ref Range Status   Specimen Description BLOOD BLOOD LEFT FOREARM  Final   Special Requests   Final    BOTTLES DRAWN AEROBIC AND ANAEROBIC Blood Culture results may not be optimal due to an inadequate volume of blood received in culture bottles   Culture  Setup Time   Final    GRAM NEGATIVE RODS IN BOTH AEROBIC AND ANAEROBIC BOTTLES CRITICAL RESULT CALLED TO, READ BACK BY AND VERIFIED WITH: Irwin Brakeman AT 6269 04/04/17 BY L BENFIELD Performed at [facility], [place] [address], [place], [place] [address]    Culture KLEBSIELLA OXYTOCA (A)  Final   Report Status 04/06/2017 FINAL  Final   Organism ID, Bacteria KLEBSIELLA OXYTOCA  Final      Susceptibility   Klebsiella oxytoca - MIC*    AMPICILLIN >=32 RESISTANT Resistant     CEFAZOLIN 8 SENSITIVE Sensitive     CEFEPIME <=1 SENSITIVE Sensitive     CEFTAZIDIME <=1 SENSITIVE Sensitive     CEFTRIAXONE <=1 SENSITIVE Sensitive     CIPROFLOXACIN <=0.25 SENSITIVE Sensitive     GENTAMICIN <=1 SENSITIVE Sensitive     IMIPENEM <=0.25 SENSITIVE Sensitive     TRIMETH/SULFA <=20 SENSITIVE Sensitive     AMPICILLIN/SULBACTAM 16 INTERMEDIATE Intermediate     PIP/TAZO <=4 SENSITIVE Sensitive     Extended ESBL NEGATIVE Sensitive     * KLEBSIELLA OXYTOCA  Blood Culture ID Panel (Reflexed)     Status: Abnormal   Collection Time: 04/03/17  5:49 PM  Result Value Ref Range Status   Enterococcus species NOT DETECTED NOT DETECTED Final   Listeria monocytogenes NOT DETECTED NOT DETECTED Final   Staphylococcus species NOT DETECTED NOT DETECTED Final   Staphylococcus aureus NOT DETECTED NOT DETECTED Final   Streptococcus species NOT DETECTED NOT DETECTED Final   Streptococcus agalactiae NOT DETECTED NOT DETECTED Final   Streptococcus pneumoniae NOT DETECTED NOT DETECTED Final   Streptococcus pyogenes  NOT DETECTED NOT DETECTED Final   Acinetobacter baumannii NOT DETECTED NOT DETECTED Final   Enterobacteriaceae species DETECTED (A) NOT DETECTED Final    Comment: Enterobacteriaceae represent a large family of gram-negative bacteria, not a single organism. CRITICAL RESULT CALLED TO, READ BACK BY AND VERIFIED WITH: M LILLISTON,PHARMD AT 0919 04/04/17 BY L BENFIELD    Enterobacter cloacae complex NOT DETECTED NOT DETECTED Final   Escherichia coli NOT DETECTED NOT DETECTED Final   Klebsiella oxytoca DETECTED (A) NOT DETECTED Final    Comment: CRITICAL RESULT CALLED TO, READ BACK BY AND VERIFIED WITH: M LILLISTON,PHARMD AT 0919 04/04/17 BY L BENFIELD    Klebsiella pneumoniae NOT DETECTED NOT DETECTED Final   Proteus species NOT DETECTED NOT DETECTED Final   Serratia marcescens NOT DETECTED NOT DETECTED Final   Carbapenem resistance NOT DETECTED NOT DETECTED Final   Haemophilus influenzae NOT DETECTED NOT DETECTED Final   Neisseria meningitidis NOT DETECTED NOT DETECTED Final   Pseudomonas aeruginosa NOT DETECTED NOT DETECTED Final   Candida albicans NOT DETECTED NOT DETECTED Final   Candida glabrata NOT DETECTED NOT DETECTED Final   Candida krusei NOT DETECTED NOT DETECTED Final   Candida parapsilosis NOT DETECTED NOT DETECTED Final   Candida tropicalis NOT DETECTED NOT DETECTED Final    Comment: Performed at [facility], [place] [address], [place], [place] [address]  Culture, blood (Routine X  2) w Reflex to ID Panel     Status: None (Preliminary result)   Collection Time: 04/06/17 10:44 AM  Result Value Ref Range Status   Specimen Description BLOOD RIGHT ANTECUBITAL  Final   Special Requests   Final    BOTTLES DRAWN AEROBIC AND ANAEROBIC Blood Culture adequate volume   Culture   Final    NO GROWTH 3 DAYS Performed at [facility], [address], [place], [place] [address]    Report Status PENDING  Incomplete  Culture, blood (Routine X 2) w Reflex to ID Panel     Status:  None (Preliminary result)   Collection Time: 04/06/17 10:44 AM  Result Value Ref Range Status   Specimen Description BLOOD LEFT HAND  Final   Special Requests   Final    BOTTLES DRAWN AEROBIC ONLY Blood Culture adequate volume   Culture   Final    NO GROWTH 3 DAYS Performed at [facility], [place] [address], [place], [place] [address]    Report Status PENDING  Incomplete      Radiology Studies: No results found.   Scheduled Meds: . amiodarone  200 mg Oral QHS  . atorvastatin  80 mg Oral q1800  . heparin  5,000 Units Subcutaneous Q8H  . insulin aspart  0-9 Units Subcutaneous TID WC  . insulin glargine  10 Units Subcutaneous QHS  . pantoprazole  40 mg Oral Daily  . polyethylene glycol  17 g Oral Daily  . senna  2 tablet Oral BID  . sodium bicarbonate  1,300 mg Oral BID   Continuous Infusions: . cefTRIAXone (ROCEPHIN)  IV Stopped (04/09/17 1250)  . furosemide Stopped (04/09/17 1507)    Niel Hummer, MD.  [facility] Pager [phone]   If 7PM-7AM, please contact night-coverage [email or web address] Password [email or web address] 04/09/2017, 6:02 PM

## 2017-04-10 LAB — BASIC METABOLIC PANEL
ANION GAP: 11 (ref 5–15)
BUN: 80 mg/dL — AB (ref 6–20)
CALCIUM: 8.7 mg/dL — AB (ref 8.9–10.3)
CO2: 20 mmol/L — AB (ref 22–32)
Chloride: 101 mmol/L (ref 101–111)
Creatinine, Ser: 3.67 mg/dL — ABNORMAL HIGH (ref 0.44–1.00)
GFR calc Af Amer: 13 mL/min — ABNORMAL LOW (ref >60)
GFR, EST NON AFRICAN AMERICAN: 11 mL/min — AB (ref >60)
GLUCOSE: 181 mg/dL — AB (ref 65–99)
Potassium: 4.5 mmol/L (ref 3.5–5.1)
Sodium: 132 mmol/L — ABNORMAL LOW (ref 135–145)

## 2017-04-10 LAB — CBC
HEMATOCRIT: 24.7 % — AB (ref 36.0–46.0)
HEMOGLOBIN: 7.8 g/dL — AB (ref 12.0–15.0)
MCH: 24.1 pg — AB (ref 26.0–34.0)
MCHC: 31.6 g/dL (ref 30.0–36.0)
MCV: 76.5 fL — ABNORMAL LOW (ref 78.0–100.0)
Platelets: 334 10*3/uL (ref 150–400)
RBC: 3.23 MIL/uL — ABNORMAL LOW (ref 3.87–5.11)
RDW: 18 % — AB (ref 11.5–15.5)
WBC: 15.2 10*3/uL — ABNORMAL HIGH (ref 4.0–10.5)

## 2017-04-10 LAB — GLUCOSE, CAPILLARY
GLUCOSE-CAPILLARY: 215 mg/dL — AB (ref 65–99)
Glucose-Capillary: 188 mg/dL — ABNORMAL HIGH (ref 65–99)
Glucose-Capillary: 213 mg/dL — ABNORMAL HIGH (ref 65–99)
Glucose-Capillary: 233 mg/dL — ABNORMAL HIGH (ref 65–99)

## 2017-04-10 MED ORDER — BISACODYL 10 MG RE SUPP
10.0000 mg | Freq: Once | RECTAL | Status: DC | PRN
  Filled 2017-04-10: qty 1

## 2017-04-10 MED ORDER — BISACODYL 10 MG RE SUPP: 10.0000 mg | Freq: Once | RECTAL | Status: DC

## 2017-04-10 MED ORDER — INSULIN ASPART 100 UNIT/ML ~~LOC~~ SOLN
0.0000 [IU] | Freq: Three times a day (TID) | SUBCUTANEOUS | Status: DC
  Administered 2017-04-10 – 2017-04-11 (×3): 5 [IU] via SUBCUTANEOUS
  Administered 2017-04-11: 3 [IU] via SUBCUTANEOUS
  Administered 2017-04-11 – 2017-04-12 (×3): 5 [IU] via SUBCUTANEOUS
  Administered 2017-04-12: 8 [IU] via SUBCUTANEOUS
  Administered 2017-04-13: 5 [IU] via SUBCUTANEOUS
  Administered 2017-04-13: 8 [IU] via SUBCUTANEOUS

## 2017-04-10 NOTE — Care Management Note (Signed)
Case Management Note  Patient Details  Name: RAEANNE DESCHLER MRN: [id number] Date of Birth: [birth year]  Subjective/Objective:    Renal failure/no dialysis/iv lasix/snf versus hospice                Action/Plan: Date:  April 10, 2017 Chart reviewed for concurrent status and case management needs. Will continue to follow patient progress. Discharge Planning: following for needs Expected discharge date: [id number] Velva Harman, BSN, [place], [place]  Expected Discharge Date:   (unknown)               Expected Discharge Plan:  [place]  In-House Referral:     Discharge planning Services  CM Consult  Post Acute Care Choice:    Choice offered to:  Patient  DME Arranged:    DME Agency:     HH Arranged:    [place] Agency:     Status of Service:  In process, will continue to follow  If discussed at Long Length of Stay Meetings, dates discussed:    Additional Comments:  Leeroy Cha, RN 04/10/2017, 9:09 AM

## 2017-04-10 NOTE — Progress Notes (Signed)
Physical Therapy Treatment Patient Details Name: [name] MRN: [id number] DOB: February 16, 1941 Today's Date: 04/10/2017    History of Present Illness Pt is a 76 yo female with hx of bladder cancer s/p bladder removal / total hystrectomy with urostomy bag placed 4 years ago complicated with UTIs, comes to the hospital with cc of fatigue, poor appetite, nausea for the past 2-3 days with mild epigastric discomfort w/o fever/chills.  Pt admitted with sepsis.  PMH of bladder cancer s/p resection and chronic urostomy, PVD, L 1st ray amputation, R BKA     PT Comments    +2 mod assist for supine to sit. In sitting pt reported feeling dizzy & nauseous. She had dry heaving and had nystagmus with head turns. She reports h/o vertigo several years ago. BP sitting 191/72, HR 105, SaO2 98% on RA. RN notified of above. Pt assisted back to bed. Will plan vestibular eval for next PT session.    Follow Up Recommendations  SNF     Equipment Recommendations  None recommended by PT    Recommendations for Other Services       Precautions / Restrictions Precautions Precautions: Fall Precaution Comments: R BKA/has prosthesis Restrictions Weight Bearing Restrictions: No    Mobility  Bed Mobility Overal bed mobility: Needs Assistance Bed Mobility: Sit to Supine;Rolling;Sidelying to Sit Rolling: +2 for physical assistance;Mod assist Sidelying to sit: Mod assist;+2 for physical assistance Supine to sit: +2 for physical assistance;HOB elevated;Max assist     General bed mobility comments: mod A to initiate roll and VCs for set up, mod A to raise trunk; max A to lie down 2* vertigo; pt had nausea/dry heaves/dizziness in sitting, exacerbated with head turns, nystagmus noted with head turns, pt reports h/o vertigo several years ago, BP 191/72 sitting, HR 105, SaO2 98% RA, RN notified  Transfers                 General transfer comment: NT 2* vertigo in sitting  Ambulation/Gait                  Stairs            Wheelchair Mobility    Modified Rankin (Stroke Patients Only)       Balance   Sitting-balance support: Feet supported;Single extremity supported Sitting balance-Leahy Scale: Fair Sitting balance - Comments: sat on EOB x 15 min                                    Cognition Arousal/Alertness: Awake/alert Behavior During Therapy: WFL for tasks assessed/performed Overall Cognitive Status: Within Functional Limits for tasks assessed                                 General Comments: pleasant      Exercises      General Comments        Pertinent Vitals/Pain Pain Score: 4  Pain Location: ABD Pain Descriptors / Indicators: Aching Pain Intervention(s): Limited activity within patient's tolerance;Monitored during session;Premedicated before session    Home Living                      Prior Function            PT Goals (current goals can now be found in the care plan section) Acute Rehab PT Goals Patient  Stated Goal: go to rehab PT Goal Formulation: With patient Time For Goal Achievement: 04/19/17 Potential to Achieve Goals: Good Progress towards PT goals: Not progressing toward goals - comment    Frequency    Min 3X/week      PT Plan Current plan remains appropriate    Co-evaluation              AM-PAC PT "6 Clicks" Daily Activity  Outcome Measure  Difficulty turning over in bed (including adjusting bedclothes, sheets and blankets)?: Unable Difficulty moving from lying on back to sitting on the side of the bed? : Unable Difficulty sitting down on and standing up from a chair with arms (e.g., wheelchair, bedside commode, etc,.)?: Unable Help needed moving to and from a bed to chair (including a wheelchair)?: Total Help needed walking in hospital room?: Total Help needed climbing 3-5 steps with a railing? : Total 6 Click Score: 6    End of Session   Activity Tolerance: Treatment  limited secondary to medical complications (Comment) (vertigo, nausea) Patient left: with call bell/phone within reach;in bed;with bed alarm set;with nursing/sitter in room Nurse Communication: Mobility status PT Visit Diagnosis: Muscle weakness (generalized) (M62.81);Other abnormalities of gait and mobility (R26.89)     Time: 8003-4917 PT Time Calculation (min) (ACUTE ONLY): 41 min  Charges:  $Therapeutic Activity: 38-52 mins                    G Codes:          Philomena Doheny 04/10/2017, 11:53 AM [phone]

## 2017-04-10 NOTE — Progress Notes (Signed)
CSW contacted [facility] Representative informed to start [place] for SNF placement at [facility].  Kathrin Greathouse, Latanya Presser, MSW Clinical Social Worker 5E and Psychiatric Service Line [phone] 04/10/2017  12:29 PM

## 2017-04-10 NOTE — Progress Notes (Signed)
PROGRESS NOTE  [name] PZW:[id number] DOB: [birth year] DOA: 04/03/2017 PCP: [name] Ruff, MD   LOS: 7 days   Brief Narrative / Interim history: Pt is a 76 yo female with hx of bladder cancer s/p bladder removal / total hystrectomy with urostomy bag placed 4 years ago complicated with UTIs, comes to the hospital with cc of fatigue, poor appetite, nausea for the past 2-3 days with mild epigastric discomfort w/o fever/chills. No diarrhea/constipation. No cough/chest pain/dyspnea. No other complaints. No open wounds.  Patient's hospital course has been complicated by progressive renal failure as well as decreased urine output.  Nephrology was consulted, and patient was deemed not a dialysis candidate, and she was placed on high-dose IV Lasix.  Palliative has been consulted as well.  She initially responded to high-dose IV Lasix, however on 8/28 her urine output seems to be decreasing and patient is beginning to look a bit more fluid overloaded.  We will increase the IV Lasix on 8/28.  Assessment & Plan: Active Problems:   History of bladder cancer   HTN (hypertension)   CAD (coronary artery disease)   Type 2 diabetes mellitus with vascular disease (HCC)   Status post above knee amputation of right lower extremity (HCC)   Obesity (BMI 30-39.9)   AKI (acute kidney injury) ([place])   Paroxysmal atrial fibrillation (HCC)   Chronic anticoagulation   Sepsis ([name])   Pressure injury of skin   Encounter for palliative care   Goals of care, counseling/discussion   Sepsis due to UTI / Klebsiella bacteremia -Patient's fever has resolved.  Her blood pressure is now stable -She was initially fluid resuscitated, and when her blood pressure stabilized she was started on IV furosemide -Blood cultures with Klebsiella, narrow to ceftriaxone -Urine culture negative, however suspect UTI to be cause for her bacteremia.  She has no GI symptoms -Surveillance cultures sent on 8/26, no growth  -WBC at  13--15 Will continue with current antibiotics.   Acute kidney injury and chronic kidney disease stage III /fluid overload -Patient was intermittently hypotensive as well, she was fluid resuscitated admission.  She became fluid overloaded and was started on Lasix.  Given progressive renal failure nephrology was consulted.  She is not a dialysis candidate, nephrologist suggested that if she does not respond to Lasix to be considered for palliative care/hospice.  Palliative care consulted. -continue with IV lasix, out put increase to 2.3  L. Cr at 3  Leukocytosis;  Afebrile.  Treat constipation.   Anemia -Hemoglobin dropped to 7.7>>7.3 after initially being in the 10 range, this looks dilutional, no evidence of bleeding currently, closely monitor -Hemoglobin stable  Paroxysmal atrial fibrillation -patient's CHA2DS2-VASc Score for Stroke Risk is > 2.  She was initially on Xarelto however that was later changed to Eliquis.  Patient has noticed some hematuria in her urostomy back about a month ago and has not taken any Eliquis since.  She understands the risks of increasing chances of stroke. Unable to use eliquis due to renal failure.   Type 2 diabetes mellitus, poorly controlled, with renal vascular complications -Continue patient's Lantus as well as sliding scale.  Hemoglobin A1c 12 Monitor on increase SSI to resistance.   Peripheral arterial disease -Patient is status post BKA amputation on the right.  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  She had a calcaneal ulcer on the left who is now healing.  She has a small partial-thickness wound on the pretibial area on the left  with surrounding erythema, wound care was consulted -she is being followed by Dr. Donnetta [name]. She is status post left femoral to anterior tibial bypass in February 2016  Possible cellulitis left pretibial area -on antibiotics as above, almost resolved Improved.   Coronary artery disease -No reports of  chest pain, most recent cardiac evaluation included a 2D echo in April 2017 which showed an EF of 65-70%, moderate LVH, normal wall motions  Hyponatremia -Initially due to dehydration, now with hyponatremia in the setting of fluid overload  Obesity   Goals of care -Appreciate palliative care follow-up.  Discussed with Dr. Domingo [name] today, if her urine output is not increasing with higher doses of Lasix and she is becoming more edematous/uremic, may be heading towards hospice soon as she is not a dialysis candidate  Deep tissue pressure injury to sacrum, present on admission -wound care  Constipation. Will try suppository today.   DVT prophylaxis: heparin Code Status: Full code Family Communication: no family at bedside Disposition Plan: TBD SNF vs hospice 2-3 days  Consultants:   Nephrology (signed off 8/26)  Palliative   Procedures:   None   Antimicrobials:  Vancomycin 8/23 >> 8/25  Zosyn 8/23 >>8/25  Ceftriaxone 8/25 >>  Subjective: She is feeling better. Dyspnea has improved.   Objective: Vitals:   04/09/17 2105 04/10/17 0533 04/10/17 1135 04/10/17 1200  BP: (!) 132/42 (!) 139/39 (!) 191/72 (!) 141/50  Pulse: 81 82 97   Resp: 16 15    Temp: 97.9 F (36.6 C) 97.7 F (36.5 C)    TempSrc: Oral Oral    SpO2: 96% 97%    Weight:  114.8 kg (253 lb)    Height:        Intake/Output Summary (Last 24 hours) at 04/10/17 1446 Last data filed at 04/10/17 0830  Gross per 24 hour  Intake              690 ml  Output             2350 ml  Net            -1660 ml   Filed Weights   04/05/17 1307 04/09/17 1100 04/10/17 0533  Weight: 100.3 kg (221 lb 1.9 oz) 108.4 kg (239 lb) 114.8 kg (253 lb)    Examination:  Vitals:   04/09/17 2105 04/10/17 0533 04/10/17 1135 04/10/17 1200  BP: (!) 132/42 (!) 139/39 (!) 191/72 (!) 141/50  Pulse: 81 82 97   Resp: 16 15    Temp: 97.9 F (36.6 C) 97.7 F (36.5 C)    TempSrc: Oral Oral    SpO2: 96% 97%    Weight:  114.8 kg (253  lb)    Height:       Constitutional: NAD ENMT: MMM Respiratory: few crackles. Normal respiratory effort.  Cardiovascular: S 1, S 2 RRR, trace edema.  Abdomen: Soft, NT, ND, Urostomy bag in place Skin/MSK: Right BKA.  Cellulitic area on the left lower extremities\ with no significant edema or redness.    Data Reviewed: I have independently reviewed following labs and imaging studies   CBC:  Recent Labs Lab 04/03/17 1627  04/06/17 0547 04/07/17 0511 04/08/17 0507 04/09/17 0512 04/10/17 0622  WBC 26.8*  < > 16.0* 14.8* 12.3* 13.4* 15.2*  NEUTROABS 25.5*  --   --   --   --   --   --   HGB 10.0*  < > 7.6* 7.3* 7.8* 7.6* 7.8*  HCT 30.3*  < > 24.5*  23.3* 24.4* 23.7* 24.7*  MCV 76.1*  < > 77.5* 76.1* 77.2* 75.7* 76.5*  PLT 415*  < > 308 294 286 311 334  < > = values in this interval not displayed. Basic Metabolic Panel:  Recent Labs Lab 04/06/17 0547 04/07/17 0511 04/08/17 0507 04/09/17 0512 04/10/17 0515  NA 131* 132* 131* 130* 132*  K 4.0 4.1 4.4 4.0 4.5  CL 102 104 101 100* 101  CO2 20* 19* 20* 20* 20*  GLUCOSE 110* 116* 252* 242* 181*  BUN 83* 83* 84* 81* 80*  CREATININE 4.66* 4.48* 4.44* 4.07* 3.67*  CALCIUM 8.5* 8.4* 8.6* 8.7* 8.7*   GFR: Estimated Creatinine Clearance: 16.2 mL/min (A) (by C-G formula based on SCr of 3.67 mg/dL (H)). Liver Function Tests:  Recent Labs Lab 04/03/17 1627 04/04/17 0656 04/05/17 0529  AST 27 31 28   ALT 22 20 23   ALKPHOS 169* 135* 150*  BILITOT 0.6 0.5 0.3  PROT 7.8 5.9* 5.8*  ALBUMIN 2.4* 1.6* 1.5*   No results for input(s): LIPASE, AMYLASE in the last 168 hours. No results for input(s): AMMONIA in the last 168 hours. Coagulation Profile: No results for input(s): INR, PROTIME in the last 168 hours. Cardiac Enzymes: No results for input(s): CKTOTAL, CKMB, CKMBINDEX, TROPONINI in the last 168 hours. BNP (last 3 results) No results for input(s): PROBNP in the last 8760 hours. HbA1C: No results for input(s): HGBA1C in  the last 72 hours. CBG:  Recent Labs Lab 04/09/17 1110 04/09/17 1637 04/09/17 2109 04/10/17 0721 04/10/17 1143  GLUCAP 233* 238* 228* 188* 215*   Lipid Profile: No results for input(s): CHOL, HDL, LDLCALC, TRIG, CHOLHDL, LDLDIRECT in the last 72 hours. Thyroid Function Tests: No results for input(s): TSH, T4TOTAL, FREET4, T3FREE, THYROIDAB in the last 72 hours. Anemia Panel: No results for input(s): VITAMINB12, FOLATE, FERRITIN, TIBC, IRON, RETICCTPCT in the last 72 hours. Urine analysis:    Component Value Date/Time   COLORURINE AMBER (A) 04/03/2017 1540   APPEARANCEUR CLOUDY (A) 04/03/2017 1540   LABSPEC 1.023 04/03/2017 1540   PHURINE 6.0 04/03/2017 1540   GLUCOSEU NEGATIVE 04/03/2017 1540   HGBUR LARGE (A) 04/03/2017 1540   BILIRUBINUR NEGATIVE 04/03/2017 1540   KETONESUR NEGATIVE 04/03/2017 1540   PROTEINUR >=300 (A) 04/03/2017 1540   UROBILINOGEN 0.2 09/12/2014 0949   NITRITE NEGATIVE 04/03/2017 1540   LEUKOCYTESUR MODERATE (A) 04/03/2017 1540   Sepsis Labs: Invalid input(s): PROCALCITONIN, LACTICIDVEN  Recent Results (from the past 240 hour(s))  Culture, Urine     Status: Abnormal   Collection Time: 04/03/17  3:40 PM  Result Value Ref Range Status   Specimen Description URINE, RANDOM  Final   Special Requests NONE  Final   Culture MULTIPLE SPECIES PRESENT, SUGGEST RECOLLECTION (A)  Final   Report Status 04/05/2017 FINAL  Final  Blood Culture (routine x 2)     Status: Abnormal   Collection Time: 04/03/17  4:34 PM  Result Value Ref Range Status   Specimen Description BLOOD BLOOD RIGHT FOREARM  Final   Special Requests   Final    BOTTLES DRAWN AEROBIC AND ANAEROBIC Blood Culture adequate volume   Culture  Setup Time   Final    GRAM NEGATIVE RODS IN BOTH AEROBIC AND ANAEROBIC BOTTLES CRITICAL RESULT CALLED TO, READ BACK BY AND VERIFIED WITH: A RUNYON,PHARMD AT 12330 04/04/17 BY L BENFIELD    Culture (A)  Final    KLEBSIELLA OXYTOCA SUSCEPTIBILITIES  PERFORMED ON PREVIOUS CULTURE WITHIN THE LAST 5 DAYS. Performed at  [facility], [place] [address], [place], [place] [address]    Report Status 04/07/2017 FINAL  Final  Blood Culture (routine x 2)     Status: Abnormal   Collection Time: 04/03/17  5:49 PM  Result Value Ref Range Status   Specimen Description BLOOD BLOOD LEFT FOREARM  Final   Special Requests   Final    BOTTLES DRAWN AEROBIC AND ANAEROBIC Blood Culture results may not be optimal due to an inadequate volume of blood received in culture bottles   Culture  Setup Time   Final    GRAM NEGATIVE RODS IN BOTH AEROBIC AND ANAEROBIC BOTTLES CRITICAL RESULT CALLED TO, READ BACK BY AND VERIFIED WITH: Irwin Brakeman AT 6213 04/04/17 BY L BENFIELD Performed at [facility], [place] [address], [place], [place] [address]    Culture KLEBSIELLA OXYTOCA (A)  Final   Report Status 04/06/2017 FINAL  Final   Organism ID, Bacteria KLEBSIELLA OXYTOCA  Final      Susceptibility   Klebsiella oxytoca - MIC*    AMPICILLIN >=32 RESISTANT Resistant     CEFAZOLIN 8 SENSITIVE Sensitive     CEFEPIME <=1 SENSITIVE Sensitive     CEFTAZIDIME <=1 SENSITIVE Sensitive     CEFTRIAXONE <=1 SENSITIVE Sensitive     CIPROFLOXACIN <=0.25 SENSITIVE Sensitive     GENTAMICIN <=1 SENSITIVE Sensitive     IMIPENEM <=0.25 SENSITIVE Sensitive     TRIMETH/SULFA <=20 SENSITIVE Sensitive     AMPICILLIN/SULBACTAM 16 INTERMEDIATE Intermediate     PIP/TAZO <=4 SENSITIVE Sensitive     Extended ESBL NEGATIVE Sensitive     * KLEBSIELLA OXYTOCA  Blood Culture ID Panel (Reflexed)     Status: Abnormal   Collection Time: 04/03/17  5:49 PM  Result Value Ref Range Status   Enterococcus species NOT DETECTED NOT DETECTED Final   Listeria monocytogenes NOT DETECTED NOT DETECTED Final   Staphylococcus species NOT DETECTED NOT DETECTED Final   Staphylococcus aureus NOT DETECTED NOT DETECTED Final   Streptococcus species NOT DETECTED NOT DETECTED Final   Streptococcus  agalactiae NOT DETECTED NOT DETECTED Final   Streptococcus pneumoniae NOT DETECTED NOT DETECTED Final   Streptococcus pyogenes NOT DETECTED NOT DETECTED Final   Acinetobacter baumannii NOT DETECTED NOT DETECTED Final   Enterobacteriaceae species DETECTED (A) NOT DETECTED Final    Comment: Enterobacteriaceae represent a large family of gram-negative bacteria, not a single organism. CRITICAL RESULT CALLED TO, READ BACK BY AND VERIFIED WITH: M LILLISTON,PHARMD AT 0919 04/04/17 BY L BENFIELD    Enterobacter cloacae complex NOT DETECTED NOT DETECTED Final   Escherichia coli NOT DETECTED NOT DETECTED Final   Klebsiella oxytoca DETECTED (A) NOT DETECTED Final    Comment: CRITICAL RESULT CALLED TO, READ BACK BY AND VERIFIED WITH: M LILLISTON,PHARMD AT 0919 04/04/17 BY L BENFIELD    Klebsiella pneumoniae NOT DETECTED NOT DETECTED Final   Proteus species NOT DETECTED NOT DETECTED Final   Serratia marcescens NOT DETECTED NOT DETECTED Final   Carbapenem resistance NOT DETECTED NOT DETECTED Final   Haemophilus influenzae NOT DETECTED NOT DETECTED Final   Neisseria meningitidis NOT DETECTED NOT DETECTED Final   Pseudomonas aeruginosa NOT DETECTED NOT DETECTED Final   Candida albicans NOT DETECTED NOT DETECTED Final   Candida glabrata NOT DETECTED NOT DETECTED Final   Candida krusei NOT DETECTED NOT DETECTED Final   Candida parapsilosis NOT DETECTED NOT DETECTED Final   Candida tropicalis NOT DETECTED NOT DETECTED Final    Comment: Performed at [facility], [place] [address], [place], [place] [address]  Culture, blood (Routine X 2) w Reflex to ID Panel     Status: None (Preliminary result)   Collection Time: 04/06/17 10:44 AM  Result Value Ref Range Status   Specimen Description BLOOD RIGHT ANTECUBITAL  Final   Special Requests   Final    BOTTLES DRAWN AEROBIC AND ANAEROBIC Blood Culture adequate volume   Culture   Final    NO GROWTH 4 DAYS Performed at [facility], [place] [address], [place], [place] [address]    Report Status PENDING  Incomplete  Culture, blood (Routine X 2) w Reflex to ID Panel     Status: None (Preliminary result)   Collection Time: 04/06/17 10:44 AM  Result Value Ref Range Status   Specimen Description BLOOD LEFT HAND  Final   Special Requests   Final    BOTTLES DRAWN AEROBIC ONLY Blood Culture adequate volume   Culture   Final    NO GROWTH 4 DAYS Performed at [facility], [place] [address] [place], [place] [address]    Report Status PENDING  Incomplete      Radiology Studies: No results found.   Scheduled Meds: . amiodarone  200 mg Oral QHS  . atorvastatin  80 mg Oral q1800  . heparin  5,000 Units Subcutaneous Q8H  . insulin aspart  0-15 Units Subcutaneous TID WC  . insulin glargine  10 Units Subcutaneous QHS  . pantoprazole  40 mg Oral Daily  . polyethylene glycol  17 g Oral Daily  . senna  2 tablet Oral BID  . sodium bicarbonate  1,300 mg Oral BID   Continuous Infusions: . cefTRIAXone (ROCEPHIN)  IV Stopped (04/10/17 1327)  . furosemide 100 mg (04/10/17 1347)    Niel Hummer, MD.  [facility] Pager [phone]   If 7PM-7AM, please contact night-coverage [email or web address] Password [email or web address] 04/10/2017, 2:46 PM

## 2017-04-11 LAB — BASIC METABOLIC PANEL
Anion gap: 7 (ref 5–15)
BUN: 78 mg/dL — AB (ref 6–20)
CHLORIDE: 101 mmol/L (ref 101–111)
CO2: 25 mmol/L (ref 22–32)
CREATININE: 3.36 mg/dL — AB (ref 0.44–1.00)
Calcium: 9 mg/dL (ref 8.9–10.3)
GFR calc Af Amer: 14 mL/min — ABNORMAL LOW (ref >60)
GFR calc non Af Amer: 12 mL/min — ABNORMAL LOW (ref >60)
Glucose, Bld: 232 mg/dL — ABNORMAL HIGH (ref 65–99)
Potassium: 4.1 mmol/L (ref 3.5–5.1)
Sodium: 133 mmol/L — ABNORMAL LOW (ref 135–145)

## 2017-04-11 LAB — URINALYSIS, ROUTINE W REFLEX MICROSCOPIC
BILIRUBIN URINE: NEGATIVE
Glucose, UA: NEGATIVE mg/dL
KETONES UR: NEGATIVE mg/dL
NITRITE: NEGATIVE
PH: 7 (ref 5.0–8.0)
Protein, ur: 100 mg/dL — AB
SPECIFIC GRAVITY, URINE: 1.009 (ref 1.005–1.030)
Squamous Epithelial / LPF: NONE SEEN

## 2017-04-11 LAB — CULTURE, BLOOD (ROUTINE X 2)
CULTURE: NO GROWTH
Culture: NO GROWTH
SPECIAL REQUESTS: ADEQUATE
Special Requests: ADEQUATE

## 2017-04-11 LAB — GLUCOSE, CAPILLARY
GLUCOSE-CAPILLARY: 205 mg/dL — AB (ref 65–99)
Glucose-Capillary: 226 mg/dL — ABNORMAL HIGH (ref 65–99)
Glucose-Capillary: 196 mg/dL — ABNORMAL HIGH (ref 65–99)
Glucose-Capillary: 228 mg/dL — ABNORMAL HIGH (ref 65–99)

## 2017-04-11 LAB — CBC
HEMATOCRIT: 25.5 % — AB (ref 36.0–46.0)
HEMOGLOBIN: 8.1 g/dL — AB (ref 12.0–15.0)
MCH: 24 pg — ABNORMAL LOW (ref 26.0–34.0)
MCHC: 31.8 g/dL (ref 30.0–36.0)
MCV: 75.4 fL — ABNORMAL LOW (ref 78.0–100.0)
Platelets: 382 10*3/uL (ref 150–400)
RBC: 3.38 MIL/uL — ABNORMAL LOW (ref 3.87–5.11)
RDW: 17.9 % — AB (ref 11.5–15.5)
WBC: 16.5 10*3/uL — ABNORMAL HIGH (ref 4.0–10.5)

## 2017-04-11 MED ORDER — INSULIN GLARGINE 100 UNIT/ML ~~LOC~~ SOLN
12.0000 [IU] | Freq: Every day | SUBCUTANEOUS | Status: DC
Start: 2017-04-11 — End: 2017-04-13
  Administered 2017-04-11 – 2017-04-12 (×2): 12 [IU] via SUBCUTANEOUS
  Filled 2017-04-11 (×3): qty 0.12

## 2017-04-11 NOTE — Progress Notes (Signed)
PROGRESS NOTE  [name] XLK:[id number] DOB: [birth year] DOA: 04/03/2017 PCP: Leighton Ruff, MD   LOS: 8 days   Brief Narrative / Interim history: Pt is a 76 yo female with hx of bladder cancer s/p bladder removal / total hystrectomy with urostomy bag placed 4 years ago complicated with UTIs, comes to the hospital with cc of fatigue, poor appetite, nausea for the past 2-3 days with mild epigastric discomfort w/o fever/chills. No diarrhea/constipation. No cough/chest pain/dyspnea. No other complaints. No open wounds.  Patient's hospital course has been complicated by progressive renal failure as well as decreased urine output.  Nephrology was consulted, and patient was deemed not a dialysis candidate, and she was placed on high-dose IV Lasix.  Palliative has been consulted as well.  She initially responded to high-dose IV Lasix, however on 8/28 her urine output seems to be decreasing and patient is beginning to look a bit more fluid overloaded.  We will increase the IV Lasix on 8/28.  Assessment & Plan: Active Problems:   History of bladder cancer   HTN (hypertension)   CAD (coronary artery disease)   Type 2 diabetes mellitus with vascular disease (HCC)   Status post above knee amputation of right lower extremity (HCC)   Obesity (BMI 30-39.9)   AKI (acute kidney injury) ([place])   Paroxysmal atrial fibrillation (HCC)   Chronic anticoagulation   Sepsis ([place])   Pressure injury of skin   Encounter for palliative care   Goals of care, counseling/discussion   Sepsis due to UTI / Klebsiella bacteremia -Patient's fever has resolved.  Her blood pressure is now stable -She was initially fluid resuscitated, and when her blood pressure stabilized she was started on IV furosemide -Blood cultures with Klebsiella, narrow to ceftriaxone -Urine culture negative, however suspect UTI to be cause for her bacteremia.  She has no GI symptoms -Surveillance cultures sent on 8/26, no growth  -WBC at  13--15--16 Will continue with current antibiotics.   Acute kidney injury and chronic kidney disease stage III /fluid overload -Patient was intermittently hypotensive as well, she was fluid resuscitated admission.  She became fluid overloaded and was started on Lasix.  Given progressive renal failure nephrology was consulted.  She is not a dialysis candidate, nephrologist suggested that if she does not respond to Lasix to be considered for palliative care/hospice.  Palliative care consulted. -continue with IV lasix, out put increase to 1.7  L. Cr at 3  Leukocytosis;  Afebrile.  Had BM.  Increase this morning. Will check blood culture. UA, Urine culture.  Follow trend.   Anemia -Hemoglobin dropped to 7.7>>7.3 after initially being in the 10 range, this looks dilutional, no evidence of bleeding currently, closely monitor -Hemoglobin stable  Paroxysmal atrial fibrillation -patient's CHA2DS2-VASc Score for Stroke Risk is > 2.  She was initially on Xarelto however that was later changed to Eliquis.  Patient has noticed some hematuria in her urostomy back about a month ago and has not taken any Eliquis since.  She understands the risks of increasing chances of stroke. Unable to use eliquis due to renal failure.   Type 2 diabetes mellitus, poorly controlled, with renal vascular complications -Continue patient's Lantus as well as sliding scale.  Hemoglobin A1c 12 Monitor on increase SSI to resistance.   Peripheral arterial disease -Patient is status post BKA amputation on the right.  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  She had a calcaneal ulcer on the left who is now healing.  She has a small partial-thickness wound on the pretibial area on the left with surrounding erythema, wound care was consulted -she is being followed by Dr. Donnetta Hutching. She is status post left femoral to anterior tibial bypass in February 2016  Possible cellulitis left pretibial area -on antibiotics as  above, almost resolved Improved.   Coronary artery disease -No reports of chest pain, most recent cardiac evaluation included a 2D echo in April 2017 which showed an EF of 65-70%, moderate LVH, normal wall motions  Hyponatremia -Initially due to dehydration, now with hyponatremia in the setting of fluid overload  Obesity   Goals of care -Appreciate palliative care follow-up.  Discussed with Dr. Domingo Cocking today, if her urine output is not increasing with higher doses of Lasix and she is becoming more edematous/uremic, may be heading towards hospice soon as she is not a dialysis candidate  Deep tissue pressure injury to sacrum, present on admission -wound care  Constipation. Had BM   DVT prophylaxis: heparin Code Status: Full code Family Communication: no family at bedside Disposition Plan: TBD SNF vs hospice 2-3 days  Consultants:   Nephrology (signed off 8/26)  Palliative   Procedures:   None   Antimicrobials:  Vancomycin 8/23 >> 8/25  Zosyn 8/23 >>8/25  Ceftriaxone 8/25 >>  Subjective: Dyspnea improving.  She is wheelchair bound.    Objective: Vitals:   04/10/17 1500 04/10/17 2117 04/11/17 0626 04/11/17 1409  BP: (!) 143/50 (!) 144/35 (!) 131/44 (!) 134/43  Pulse: 73 75 85 76  Resp: 18 18 18 18   Temp: 97.6 F (36.4 C) 98 F (36.7 C) 97.7 F (36.5 C) (!) 97.5 F (36.4 C)  TempSrc: Oral Oral Oral Oral  SpO2: 99% 100% 100% 96%  Weight:    96.6 kg (213 lb)  Height:        Intake/Output Summary (Last 24 hours) at 04/11/17 1508 Last data filed at 04/11/17 3825  Gross per 24 hour  Intake              170 ml  Output              751 ml  Net             -581 ml   Filed Weights   04/09/17 1100 04/10/17 0533 04/11/17 1409  Weight: 108.4 kg (239 lb) 114.8 kg (253 lb) 96.6 kg (213 lb)    Examination:  Vitals:   04/10/17 1500 04/10/17 2117 04/11/17 0626 04/11/17 1409  BP: (!) 143/50 (!) 144/35 (!) 131/44 (!) 134/43  Pulse: 73 75 85 76  Resp: 18 18 18  18   Temp: 97.6 F (36.4 C) 98 F (36.7 C) 97.7 F (36.5 C) (!) 97.5 F (36.4 C)  TempSrc: Oral Oral Oral Oral  SpO2: 99% 100% 100% 96%  Weight:    96.6 kg (213 lb)  Height:       Constitutional: NAD ENMT: MMM Respiratory: Normal respiratory effort,. No wheezing.  Cardiovascular: S 1, S 2 RRR Abdomen: Soft, NT, ND, Urostomy bag in place.  Skin/MSK: Right BKA.  Cellulitic area on the left lower extremities\ with no significant edema or redness.    Data Reviewed: I have independently reviewed following labs and imaging studies   CBC:  Recent Labs Lab 04/07/17 0511 04/08/17 0507 04/09/17 0512 04/10/17 0622 04/11/17 0517  WBC 14.8* 12.3* 13.4* 15.2* 16.5*  HGB 7.3* 7.8* 7.6* 7.8* 8.1*  HCT 23.3* 24.4* 23.7* 24.7* 25.5*  MCV 76.1* 77.2* 75.7* 76.5* 75.4*  PLT 294 286 311 334 338   Basic Metabolic Panel:  Recent Labs Lab 04/07/17 0511 04/08/17 0507 04/09/17 0512 04/10/17 0515 04/11/17 0517  NA 132* 131* 130* 132* 133*  K 4.1 4.4 4.0 4.5 4.1  CL 104 101 100* 101 101  CO2 19* 20* 20* 20* 25  GLUCOSE 116* 252* 242* 181* 232*  BUN 83* 84* 81* 80* 78*  CREATININE 4.48* 4.44* 4.07* 3.67* 3.36*  CALCIUM 8.4* 8.6* 8.7* 8.7* 9.0   GFR: Estimated Creatinine Clearance: 16 mL/min (A) (by C-G formula based on SCr of 3.36 mg/dL (H)). Liver Function Tests:  Recent Labs Lab 04/05/17 0529  AST 28  ALT 23  ALKPHOS 150*  BILITOT 0.3  PROT 5.8*  ALBUMIN 1.5*   No results for input(s): LIPASE, AMYLASE in the last 168 hours. No results for input(s): AMMONIA in the last 168 hours. Coagulation Profile: No results for input(s): INR, PROTIME in the last 168 hours. Cardiac Enzymes: No results for input(s): CKTOTAL, CKMB, CKMBINDEX, TROPONINI in the last 168 hours. BNP (last 3 results) No results for input(s): PROBNP in the last 8760 hours. HbA1C: No results for input(s): HGBA1C in the last 72 hours. CBG:  Recent Labs Lab 04/10/17 1143 04/10/17 1647 04/10/17 2126  04/11/17 0715 04/11/17 1126  GLUCAP 215* 213* 233* 226* 196*   Lipid Profile: No results for input(s): CHOL, HDL, LDLCALC, TRIG, CHOLHDL, LDLDIRECT in the last 72 hours. Thyroid Function Tests: No results for input(s): TSH, T4TOTAL, FREET4, T3FREE, THYROIDAB in the last 72 hours. Anemia Panel: No results for input(s): VITAMINB12, FOLATE, FERRITIN, TIBC, IRON, RETICCTPCT in the last 72 hours. Urine analysis:    Component Value Date/Time   COLORURINE AMBER (A) 04/03/2017 1540   APPEARANCEUR CLOUDY (A) 04/03/2017 1540   LABSPEC 1.023 04/03/2017 1540   PHURINE 6.0 04/03/2017 1540   GLUCOSEU NEGATIVE 04/03/2017 1540   HGBUR LARGE (A) 04/03/2017 1540   BILIRUBINUR NEGATIVE 04/03/2017 1540   KETONESUR NEGATIVE 04/03/2017 1540   PROTEINUR >=300 (A) 04/03/2017 1540   UROBILINOGEN 0.2 09/12/2014 0949   NITRITE NEGATIVE 04/03/2017 1540   LEUKOCYTESUR MODERATE (A) 04/03/2017 1540   Sepsis Labs: Invalid input(s): PROCALCITONIN, LACTICIDVEN  Recent Results (from the past 240 hour(s))  Culture, Urine     Status: Abnormal   Collection Time: 04/03/17  3:40 PM  Result Value Ref Range Status   Specimen Description URINE, RANDOM  Final   Special Requests NONE  Final   Culture MULTIPLE SPECIES PRESENT, SUGGEST RECOLLECTION (A)  Final   Report Status 04/05/2017 FINAL  Final  Blood Culture (routine x 2)     Status: Abnormal   Collection Time: 04/03/17  4:34 PM  Result Value Ref Range Status   Specimen Description BLOOD BLOOD RIGHT FOREARM  Final   Special Requests   Final    BOTTLES DRAWN AEROBIC AND ANAEROBIC Blood Culture adequate volume   Culture  Setup Time   Final    GRAM NEGATIVE RODS IN BOTH AEROBIC AND ANAEROBIC BOTTLES CRITICAL RESULT CALLED TO, READ BACK BY AND VERIFIED WITH: A RUNYON,PHARMD AT 12330 04/04/17 BY L BENFIELD    Culture (A)  Final    KLEBSIELLA OXYTOCA SUSCEPTIBILITIES PERFORMED ON PREVIOUS CULTURE WITHIN THE LAST 5 DAYS. Performed at [facility], [place] [address], [place], [place] [address]    Report Status 04/07/2017 FINAL  Final  Blood Culture (routine x 2)     Status: Abnormal   Collection Time: 04/03/17  5:49 PM  Result Value Ref  Range Status   Specimen Description BLOOD BLOOD LEFT FOREARM  Final   Special Requests   Final    BOTTLES DRAWN AEROBIC AND ANAEROBIC Blood Culture results may not be optimal due to an inadequate volume of blood received in culture bottles   Culture  Setup Time   Final    GRAM NEGATIVE RODS IN BOTH AEROBIC AND ANAEROBIC BOTTLES CRITICAL RESULT CALLED TO, READ BACK BY AND VERIFIED WITH: Irwin Brakeman AT 5329 04/04/17 BY L BENFIELD Performed at [facility], [place] [address], [place], [place] [address]    Culture KLEBSIELLA OXYTOCA (A)  Final   Report Status 04/06/2017 FINAL  Final   Organism ID, Bacteria KLEBSIELLA OXYTOCA  Final      Susceptibility   Klebsiella oxytoca - MIC*    AMPICILLIN >=32 RESISTANT Resistant     CEFAZOLIN 8 SENSITIVE Sensitive     CEFEPIME <=1 SENSITIVE Sensitive     CEFTAZIDIME <=1 SENSITIVE Sensitive     CEFTRIAXONE <=1 SENSITIVE Sensitive     CIPROFLOXACIN <=0.25 SENSITIVE Sensitive     GENTAMICIN <=1 SENSITIVE Sensitive     IMIPENEM <=0.25 SENSITIVE Sensitive     TRIMETH/SULFA <=20 SENSITIVE Sensitive     AMPICILLIN/SULBACTAM 16 INTERMEDIATE Intermediate     PIP/TAZO <=4 SENSITIVE Sensitive     Extended ESBL NEGATIVE Sensitive     * KLEBSIELLA OXYTOCA  Blood Culture ID Panel (Reflexed)     Status: Abnormal   Collection Time: 04/03/17  5:49 PM  Result Value Ref Range Status   Enterococcus species NOT DETECTED NOT DETECTED Final   Listeria monocytogenes NOT DETECTED NOT DETECTED Final   Staphylococcus species NOT DETECTED NOT DETECTED Final   Staphylococcus aureus NOT DETECTED NOT DETECTED Final   Streptococcus species NOT DETECTED NOT DETECTED Final   Streptococcus agalactiae NOT DETECTED NOT DETECTED Final   Streptococcus pneumoniae NOT DETECTED NOT DETECTED Final     Streptococcus pyogenes NOT DETECTED NOT DETECTED Final   Acinetobacter baumannii NOT DETECTED NOT DETECTED Final   Enterobacteriaceae species DETECTED (A) NOT DETECTED Final    Comment: Enterobacteriaceae represent a large family of gram-negative bacteria, not a single organism. CRITICAL RESULT CALLED TO, READ BACK BY AND VERIFIED WITH: M LILLISTON,PHARMD AT 0919 04/04/17 BY L BENFIELD    Enterobacter cloacae complex NOT DETECTED NOT DETECTED Final   Escherichia coli NOT DETECTED NOT DETECTED Final   Klebsiella oxytoca DETECTED (A) NOT DETECTED Final    Comment: CRITICAL RESULT CALLED TO, READ BACK BY AND VERIFIED WITH: M LILLISTON,PHARMD AT 0919 04/04/17 BY L BENFIELD    Klebsiella pneumoniae NOT DETECTED NOT DETECTED Final   Proteus species NOT DETECTED NOT DETECTED Final   Serratia marcescens NOT DETECTED NOT DETECTED Final   Carbapenem resistance NOT DETECTED NOT DETECTED Final   Haemophilus influenzae NOT DETECTED NOT DETECTED Final   Neisseria meningitidis NOT DETECTED NOT DETECTED Final   Pseudomonas aeruginosa NOT DETECTED NOT DETECTED Final   Candida albicans NOT DETECTED NOT DETECTED Final   Candida glabrata NOT DETECTED NOT DETECTED Final   Candida krusei NOT DETECTED NOT DETECTED Final   Candida parapsilosis NOT DETECTED NOT DETECTED Final   Candida tropicalis NOT DETECTED NOT DETECTED Final    Comment: Performed at [facility], [place] [address] [place], [place] [address]  Culture, blood (Routine X 2) w Reflex to ID Panel     Status: None (Preliminary result)   Collection Time: 04/06/17 10:44 AM  Result Value Ref Range Status  Specimen Description BLOOD RIGHT ANTECUBITAL  Final   Special Requests   Final    BOTTLES DRAWN AEROBIC AND ANAEROBIC Blood Culture adequate volume   Culture   Final    NO GROWTH 4 DAYS Performed at [facility], [address] [place], [place] [address]    Report Status PENDING  Incomplete  Culture, blood (Routine X 2) w Reflex  to ID Panel     Status: None (Preliminary result)   Collection Time: 04/06/17 10:44 AM  Result Value Ref Range Status   Specimen Description BLOOD LEFT HAND  Final   Special Requests   Final    BOTTLES DRAWN AEROBIC ONLY Blood Culture adequate volume   Culture   Final    NO GROWTH 4 DAYS Performed at [facility], [place] [address], [place], [place] [address]    Report Status PENDING  Incomplete      Radiology Studies: No results found.   Scheduled Meds: . amiodarone  200 mg Oral QHS  . atorvastatin  80 mg Oral q1800  . heparin  5,000 Units Subcutaneous Q8H  . insulin aspart  0-15 Units Subcutaneous TID WC  . insulin glargine  10 Units Subcutaneous QHS  . pantoprazole  40 mg Oral Daily  . polyethylene glycol  17 g Oral Daily  . senna  2 tablet Oral BID  . sodium bicarbonate  1,300 mg Oral BID   Continuous Infusions: . cefTRIAXone (ROCEPHIN)  IV Stopped (04/10/17 1327)  . furosemide 100 mg (04/10/17 2302)    Niel Hummer, MD.  [facility] Pager [phone]   If 7PM-7AM, please contact night-coverage [email or web address] Password [email or web address] 04/11/2017, 3:08 PM

## 2017-04-11 NOTE — Progress Notes (Signed)
Inpatient Diabetes Program Recommendations  AACE/ADA: New Consensus Statement on Inpatient Glycemic Control (2015)  Target Ranges:  Prepandial:   less than 140 mg/dL      Peak postprandial:   less than 180 mg/dL (1-2 hours)      Critically ill patients:  140 - 180 mg/dL   Lab Results  Component Value Date   GLUCAP 226 (H) 04/11/2017   HGBA1C 12.0 (H) 04/05/2017    Review of Glycemic Control  Blood sugars > 180 mg/dL. Needs insulin adjustment. Would benefit from meal coverage insulin. Doubtful HgbA1C is accurate with low H/H  Inpatient Diabetes Program Recommendations:   Increase Lantus to 15 units QHS Add Novolog 3 units tidwc for meal coverage insulin.  Will continue to follow.  Thank you. Lorenda Peck, RD, LDN, CDE Inpatient Diabetes Coordinator [phone]

## 2017-04-12 LAB — URINE CULTURE: Culture: NO GROWTH

## 2017-04-12 LAB — BASIC METABOLIC PANEL
ANION GAP: 8 (ref 5–15)
BUN: 74 mg/dL — AB (ref 6–20)
CHLORIDE: 100 mmol/L — AB (ref 101–111)
CO2: 24 mmol/L (ref 22–32)
Calcium: 8.8 mg/dL — ABNORMAL LOW (ref 8.9–10.3)
Creatinine, Ser: 3.28 mg/dL — ABNORMAL HIGH (ref 0.44–1.00)
GFR calc Af Amer: 15 mL/min — ABNORMAL LOW (ref >60)
GFR, EST NON AFRICAN AMERICAN: 13 mL/min — AB (ref >60)
GLUCOSE: 277 mg/dL — AB (ref 65–99)
POTASSIUM: 4.3 mmol/L (ref 3.5–5.1)
SODIUM: 132 mmol/L — AB (ref 135–145)

## 2017-04-12 LAB — GLUCOSE, CAPILLARY
GLUCOSE-CAPILLARY: 279 mg/dL — AB (ref 65–99)
GLUCOSE-CAPILLARY: 212 mg/dL — AB (ref 65–99)
Glucose-Capillary: 249 mg/dL — ABNORMAL HIGH (ref 65–99)

## 2017-04-12 LAB — CBC WITH DIFFERENTIAL/PLATELET
BASOS ABS: 0 10*3/uL (ref 0.0–0.1)
Basophils Relative: 0 %
EOS PCT: 4 %
Eosinophils Absolute: 0.5 10*3/uL (ref 0.0–0.7)
HCT: 27.7 % — ABNORMAL LOW (ref 36.0–46.0)
HEMOGLOBIN: 8.6 g/dL — AB (ref 12.0–15.0)
LYMPHS ABS: 1.5 10*3/uL (ref 0.7–4.0)
LYMPHS PCT: 12 %
MCH: 24 pg — AB (ref 26.0–34.0)
MCHC: 31 g/dL (ref 30.0–36.0)
MCV: 77.2 fL — AB (ref 78.0–100.0)
Monocytes Absolute: 0.9 10*3/uL (ref 0.1–1.0)
Monocytes Relative: 7 %
NEUTROS PCT: 77 %
Neutro Abs: 10.1 10*3/uL — ABNORMAL HIGH (ref 1.7–7.7)
PLATELETS: 387 10*3/uL (ref 150–400)
RBC: 3.59 MIL/uL — AB (ref 3.87–5.11)
RDW: 18.4 % — ABNORMAL HIGH (ref 11.5–15.5)
WBC: 13 10*3/uL — AB (ref 4.0–10.5)

## 2017-04-12 MED ORDER — FUROSEMIDE 40 MG PO TABS
80.0000 mg | ORAL_TABLET | Freq: Three times a day (TID) | ORAL | Status: DC
  Administered 2017-04-12 – 2017-04-13 (×3): 80 mg via ORAL
  Filled 2017-04-12 (×3): qty 2

## 2017-04-12 NOTE — Progress Notes (Signed)
Physical Therapy Treatment Patient Details Name: [name] MRN: [id number] DOB: 1941/03/15 Today's Date: 04/12/2017    History of Present Illness Pt is a 76 yo female with hx of bladder cancer s/p bladder removal / total hystrectomy with urostomy bag placed 4 years ago complicated with UTIs, comes to the hospital with cc of fatigue, poor appetite, nausea for the past 2-3 days with mild epigastric discomfort w/o fever/chills.  Pt admitted with sepsis.  PMH of bladder cancer s/p resection and chronic urostomy, PVD, L 1st ray amputation, R BKA     PT Comments    Pt feeling "weak" and "tired".  Assisted to EOB pt c/o dizziness and nausea.  Required extended sitting rest break to decrease symptoms.  + 2 assist with sit to stand using EVA walker.  Pt was able to pull self up from walker handles and static stand approx 45 sec.  Attempted pre pivot transfer performing marching however pt was unable to support self on R LE as hips and knee buckled.  Pt was able to stand on her L LE.  Performed a lateral scoot from elevated bed to drop arm recliner.  Pt required extended rest breaks between each activity.  Positioned in recliner.   Follow Up Recommendations  SNF ([facility])     Equipment Recommendations  None recommended by PT    Recommendations for Other Services       Precautions / Restrictions Precautions Precautions: Fall Precaution Comments: R BKA/has prosthesis Restrictions Weight Bearing Restrictions: No    Mobility  Bed Mobility Overal bed mobility: Needs Assistance Bed Mobility: Supine to Sit     Supine to sit: Min assist;Mod assist     General bed mobility comments: HOB elevated and use of rails, pt more able to self assist.  Once upright, pt required 5 min sitting rest break due to mod c/o dizziness and nausea.    Transfers Overall transfer level: Needs assistance   Transfers: Sit to/from Stand;Lateral/Scoot Transfers Sit to Stand: Min assist;Mod assist;+2  safety/equipment        Lateral/Scoot Transfers: Min assist;Mod assist;+2 safety/equipment General transfer comment: using EVA walker for increased support, pt was able to perform sit to stand pulling self up from walker handle to static stand approx 45 sec before c/o fatigue and needing to sit.    during standing, attempted pre gait stepping however pt was unable to support self during R LE stance/L foot up.  Hips and knee buckled.  Pt was able to static stand on L LE.  Did not attempt pivot sit to recliner.  Performed   Ambulation/Gait             General Gait Details: transfers only   Stairs            Wheelchair Mobility    Modified Rankin (Stroke Patients Only)       Balance                                            Cognition Arousal/Alertness: Awake/alert Behavior During Therapy: WFL for tasks assessed/performed Overall Cognitive Status: Within Functional Limits for tasks assessed                                 General Comments: pleasant      Exercises  General Comments        Pertinent Vitals/Pain Pain Assessment: No/denies pain    Home Living                      Prior Function            PT Goals (current goals can now be found in the care plan section) Progress towards PT goals: Progressing toward goals    Frequency    Min 3X/week      PT Plan Current plan remains appropriate    Co-evaluation              AM-PAC PT "6 Clicks" Daily Activity  Outcome Measure  Difficulty turning over in bed (including adjusting bedclothes, sheets and blankets)?: Unable Difficulty moving from lying on back to sitting on the side of the bed? : Unable Difficulty sitting down on and standing up from a chair with arms (e.g., wheelchair, bedside commode, etc,.)?: Unable Help needed moving to and from a bed to chair (including a wheelchair)?: Total Help needed walking in hospital room?: Total Help  needed climbing 3-5 steps with a railing? : Total 6 Click Score: 6    End of Session Equipment Utilized During Treatment: Gait belt Activity Tolerance: Treatment limited secondary to medical complications (Comment) Patient left: with call bell/phone within reach;with bed alarm set;with nursing/sitter in room;in chair Nurse Communication: Mobility status (reported to NT to lateral scoot back to bed "do not attempt standing") PT Visit Diagnosis: Muscle weakness (generalized) (M62.81);Other abnormalities of gait and mobility (R26.89)     Time: 1450-1515 PT Time Calculation (min) (ACUTE ONLY): 25 min  Charges:  $Gait Training: 8-22 mins $Therapeutic Activity: 8-22 mins                    G Codes:       {Dynver Clemson  PTA [facility]  Acute  Rehab Pager      [phone]

## 2017-04-12 NOTE — Clinical Social Work Note (Signed)
SNF bed available at [facility] when medically stable.  Insurance auth with [employer] is in place. Awaiting stability per MD  (progress note 04/11/17 indicates d/c 2-3 days. Lorie Phenix. Pauline Good, [place] (weekend coverage)

## 2017-04-12 NOTE — Progress Notes (Signed)
PROGRESS NOTE  [name] NTI:[id number] DOB: 12-04-1940 DOA: 04/03/2017 PCP: Leighton Ruff, MD   LOS: 9 days   Brief Narrative / Interim history: Pt is a 76 yo female with hx of bladder cancer s/p bladder removal / total hystrectomy with urostomy bag placed 4 years ago complicated with UTIs, comes to the hospital with cc of fatigue, poor appetite, nausea for the past 2-3 days with mild epigastric discomfort w/o fever/chills. No diarrhea/constipation. No cough/chest pain/dyspnea. No other complaints. No open wounds.  Patient's hospital course has been complicated by progressive renal failure as well as decreased urine output.  Nephrology was consulted, and patient was deemed not a dialysis candidate, and she was placed on high-dose IV Lasix.  Palliative has been consulted as well.  She initially responded to high-dose IV Lasix, however on 8/28 her urine output seems to be decreasing and patient is beginning to look a bit more fluid overloaded.  We will increase the IV Lasix on 8/28.  Assessment & Plan: Active Problems:   History of bladder cancer   HTN (hypertension)   CAD (coronary artery disease)   Type 2 diabetes mellitus with vascular disease (HCC)   Status post above knee amputation of right lower extremity (HCC)   Obesity (BMI 30-39.9)   AKI (acute kidney injury) ([place])   Paroxysmal atrial fibrillation (HCC)   Chronic anticoagulation   Sepsis ([place])   Pressure injury of skin   Encounter for palliative care   Goals of care, counseling/discussion   Sepsis due to UTI / Klebsiella bacteremia -Patient's fever has resolved.  Her blood pressure is now stable -She was initially fluid resuscitated, and when her blood pressure stabilized she was started on IV furosemide -Blood cultures with Klebsiella, narrow to ceftriaxone -Urine culture negative, however suspect UTI to be cause for her bacteremia.  She has no GI symptoms -Surveillance cultures sent on 8/26, no growth  -WBC at  13--15--16--13 Will continue with current antibiotics. Discharge on keflex. Needs 8 more days to complete 2 weeks from last blood culture negative.   Acute kidney injury and chronic kidney disease stage III /fluid overload -Patient was intermittently hypotensive as well, she was fluid resuscitated admission.  She became fluid overloaded and was started on Lasix.  Given progressive renal failure nephrology was consulted.  She is not a dialysis candidate, nephrologist suggested that if she does not respond to Lasix to be considered for palliative care/hospice.  Palliative care consulted. -change to oral lasix.   Leukocytosis;  Afebrile.  Had BM.  Blood culture negative. Urine culture pending.  Follow trend.   Anemia -Hemoglobin dropped to 7.7>>7.3 after initially being in the 10 range, this looks dilutional, no evidence of bleeding currently, closely monitor -Hemoglobin stable  Paroxysmal atrial fibrillation -patient's CHA2DS2-VASc Score for Stroke Risk is > 2.  She was initially on Xarelto however that was later changed to Eliquis.  Patient has noticed some hematuria in her urostomy back about a month ago and has not taken any Eliquis since.  She understands the risks of increasing chances of stroke. Unable to use eliquis due to renal failure.   Type 2 diabetes mellitus, poorly controlled, with renal vascular complications -Continue patient's Lantus as well as sliding scale.  Hemoglobin A1c 12 Monitor on increase SSI to resistance.   Peripheral arterial disease -Patient is status post BKA amputation on the right.  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  She had a calcaneal ulcer on the left who  is now healing.  She has a small partial-thickness wound on the pretibial area on the left with surrounding erythema, wound care was consulted -she is being followed by Dr. Donnetta Hutching. She is status post left femoral to anterior tibial bypass in February 2016  Possible cellulitis  left pretibial area -on antibiotics as above, almost resolved Improved.   Coronary artery disease -No reports of chest pain, most recent cardiac evaluation included a 2D echo in April 2017 which showed an EF of 65-70%, moderate LVH, normal wall motions  Hyponatremia -Initially due to dehydration, now with hyponatremia in the setting of fluid overload  Obesity   Goals of care -Appreciate palliative care follow-up.  Discussed with Dr. Domingo Cocking today, if her urine output is not increasing with higher doses of Lasix and she is becoming more edematous/uremic, may be heading towards hospice soon as she is not a dialysis candidate  Deep tissue pressure injury to sacrum, present on admission -wound care  Constipation. Had BM   DVT prophylaxis: heparin Code Status: Full code Family Communication: no family at bedside Disposition Plan: SNF in 24 hours.   Consultants:   Nephrology (signed off 8/26)  Palliative   Procedures:   None   Antimicrobials:  Vancomycin 8/23 >> 8/25  Zosyn 8/23 >>8/25  Ceftriaxone 8/25 >>  Subjective: Dyspnea improved.  Had BM yesterday   Objective: Vitals:   04/11/17 0626 04/11/17 1409 04/11/17 1954 04/12/17 0559  BP: (!) 131/44 (!) 134/43 (!) 149/45 (!) 129/46  Pulse: 85 76 80 77  Resp: 18 18 17 16   Temp: 97.7 F (36.5 C) (!) 97.5 F (36.4 C) (!) 97.5 F (36.4 C) 97.7 F (36.5 C)  TempSrc: Oral Oral Oral Oral  SpO2: 100% 96% 99% 99%  Weight:  96.6 kg (213 lb)  120.2 kg (265 lb)  Height:        Intake/Output Summary (Last 24 hours) at 04/12/17 1340 Last data filed at 04/12/17 1115  Gross per 24 hour  Intake              480 ml  Output             1750 ml  Net            -1270 ml   Filed Weights   04/10/17 0533 04/11/17 1409 04/12/17 0559  Weight: 114.8 kg (253 lb) 96.6 kg (213 lb) 120.2 kg (265 lb)    Examination:  Vitals:   04/11/17 0626 04/11/17 1409 04/11/17 1954 04/12/17 0559  BP: (!) 131/44 (!) 134/43 (!) 149/45 (!)  129/46  Pulse: 85 76 80 77  Resp: 18 18 17 16   Temp: 97.7 F (36.5 C) (!) 97.5 F (36.4 C) (!) 97.5 F (36.4 C) 97.7 F (36.5 C)  TempSrc: Oral Oral Oral Oral  SpO2: 100% 96% 99% 99%  Weight:  96.6 kg (213 lb)  120.2 kg (265 lb)  Height:       Constitutional: NAD ENMT: MMM Respiratory: Normal respiratory effort. CTA Cardiovascular: S 1, S 2 RRR Abdomen: soft, nt Urostomy bag in place.  Skin/MSK: Right BKA.  Cellulitic area on the left lower extremities\ with no significant edema or redness.    Data Reviewed: I have independently reviewed following labs and imaging studies   CBC:  Recent Labs Lab 04/08/17 0507 04/09/17 0512 04/10/17 0622 04/11/17 0517 04/12/17 0628  WBC 12.3* 13.4* 15.2* 16.5* 13.0*  NEUTROABS  --   --   --   --  10.1*  HGB 7.8*  7.6* 7.8* 8.1* 8.6*  HCT 24.4* 23.7* 24.7* 25.5* 27.7*  MCV 77.2* 75.7* 76.5* 75.4* 77.2*  PLT 286 311 334 382 161   Basic Metabolic Panel:  Recent Labs Lab 04/08/17 0507 04/09/17 0512 04/10/17 0515 04/11/17 0517 04/12/17 0628  NA 131* 130* 132* 133* 132*  K 4.4 4.0 4.5 4.1 4.3  CL 101 100* 101 101 100*  CO2 20* 20* 20* 25 24  GLUCOSE 252* 242* 181* 232* 277*  BUN 84* 81* 80* 78* 74*  CREATININE 4.44* 4.07* 3.67* 3.36* 3.28*  CALCIUM 8.6* 8.7* 8.7* 9.0 8.8*   GFR: Estimated Creatinine Clearance: 18.6 mL/min (A) (by C-G formula based on SCr of 3.28 mg/dL (H)). Liver Function Tests: No results for input(s): AST, ALT, ALKPHOS, BILITOT, PROT, ALBUMIN in the last 168 hours. No results for input(s): LIPASE, AMYLASE in the last 168 hours. No results for input(s): AMMONIA in the last 168 hours. Coagulation Profile: No results for input(s): INR, PROTIME in the last 168 hours. Cardiac Enzymes: No results for input(s): CKTOTAL, CKMB, CKMBINDEX, TROPONINI in the last 168 hours. BNP (last 3 results) No results for input(s): PROBNP in the last 8760 hours. HbA1C: No results for input(s): HGBA1C in the last 72  hours. CBG:  Recent Labs Lab 04/11/17 1126 04/11/17 1716 04/11/17 1951 04/12/17 0713 04/12/17 1110  GLUCAP 196* 205* 228* 279* 249*   Lipid Profile: No results for input(s): CHOL, HDL, LDLCALC, TRIG, CHOLHDL, LDLDIRECT in the last 72 hours. Thyroid Function Tests: No results for input(s): TSH, T4TOTAL, FREET4, T3FREE, THYROIDAB in the last 72 hours. Anemia Panel: No results for input(s): VITAMINB12, FOLATE, FERRITIN, TIBC, IRON, RETICCTPCT in the last 72 hours. Urine analysis:    Component Value Date/Time   COLORURINE YELLOW 04/11/2017 [place] 04/11/2017 1431   LABSPEC 1.009 04/11/2017 1431   PHURINE 7.0 04/11/2017 1431   GLUCOSEU NEGATIVE 04/11/2017 1431   HGBUR SMALL (A) 04/11/2017 1431   BILIRUBINUR NEGATIVE 04/11/2017 1431   KETONESUR NEGATIVE 04/11/2017 1431   PROTEINUR 100 (A) 04/11/2017 1431   UROBILINOGEN 0.2 09/12/2014 0949   NITRITE NEGATIVE 04/11/2017 1431   LEUKOCYTESUR LARGE (A) 04/11/2017 1431   Sepsis Labs: Invalid input(s): PROCALCITONIN, LACTICIDVEN  Recent Results (from the past 240 hour(s))  Culture, Urine     Status: Abnormal   Collection Time: 04/03/17  3:40 PM  Result Value Ref Range Status   Specimen Description URINE, RANDOM  Final   Special Requests NONE  Final   Culture MULTIPLE SPECIES PRESENT, SUGGEST RECOLLECTION (A)  Final   Report Status 04/05/2017 FINAL  Final  Blood Culture (routine x 2)     Status: Abnormal   Collection Time: 04/03/17  4:34 PM  Result Value Ref Range Status   Specimen Description BLOOD BLOOD RIGHT FOREARM  Final   Special Requests   Final    BOTTLES DRAWN AEROBIC AND ANAEROBIC Blood Culture adequate volume   Culture  Setup Time   Final    GRAM NEGATIVE RODS IN BOTH AEROBIC AND ANAEROBIC BOTTLES CRITICAL RESULT CALLED TO, READ BACK BY AND VERIFIED WITH: A RUNYON,PHARMD AT 12330 04/04/17 BY L BENFIELD    Culture (A)  Final    KLEBSIELLA OXYTOCA SUSCEPTIBILITIES PERFORMED ON PREVIOUS CULTURE  WITHIN THE LAST 5 DAYS. Performed at [facility], [place] [address], [place], [place] [address]    Report Status 04/07/2017 FINAL  Final  Blood Culture (routine x 2)     Status: Abnormal   Collection Time: 04/03/17  5:49 PM  Result Value Ref Range Status   Specimen Description BLOOD BLOOD LEFT FOREARM  Final   Special Requests   Final    BOTTLES DRAWN AEROBIC AND ANAEROBIC Blood Culture results may not be optimal due to an inadequate volume of blood received in culture bottles   Culture  Setup Time   Final    GRAM NEGATIVE RODS IN BOTH AEROBIC AND ANAEROBIC BOTTLES CRITICAL RESULT CALLED TO, READ BACK BY AND VERIFIED WITH: Irwin Brakeman AT 8101 04/04/17 BY L BENFIELD Performed at [facility], [place] [address], [place], [place] [address]    Culture KLEBSIELLA OXYTOCA (A)  Final   Report Status 04/06/2017 FINAL  Final   Organism ID, Bacteria KLEBSIELLA OXYTOCA  Final      Susceptibility   Klebsiella oxytoca - MIC*    AMPICILLIN >=32 RESISTANT Resistant     CEFAZOLIN 8 SENSITIVE Sensitive     CEFEPIME <=1 SENSITIVE Sensitive     CEFTAZIDIME <=1 SENSITIVE Sensitive     CEFTRIAXONE <=1 SENSITIVE Sensitive     CIPROFLOXACIN <=0.25 SENSITIVE Sensitive     GENTAMICIN <=1 SENSITIVE Sensitive     IMIPENEM <=0.25 SENSITIVE Sensitive     TRIMETH/SULFA <=20 SENSITIVE Sensitive     AMPICILLIN/SULBACTAM 16 INTERMEDIATE Intermediate     PIP/TAZO <=4 SENSITIVE Sensitive     Extended ESBL NEGATIVE Sensitive     * KLEBSIELLA OXYTOCA  Blood Culture ID Panel (Reflexed)     Status: Abnormal   Collection Time: 04/03/17  5:49 PM  Result Value Ref Range Status   Enterococcus species NOT DETECTED NOT DETECTED Final   Listeria monocytogenes NOT DETECTED NOT DETECTED Final   Staphylococcus species NOT DETECTED NOT DETECTED Final   Staphylococcus aureus NOT DETECTED NOT DETECTED Final   Streptococcus species NOT DETECTED NOT DETECTED Final   Streptococcus agalactiae NOT DETECTED NOT  DETECTED Final   Streptococcus pneumoniae NOT DETECTED NOT DETECTED Final   Streptococcus pyogenes NOT DETECTED NOT DETECTED Final   Acinetobacter baumannii NOT DETECTED NOT DETECTED Final   Enterobacteriaceae species DETECTED (A) NOT DETECTED Final    Comment: Enterobacteriaceae represent a large family of gram-negative bacteria, not a single organism. CRITICAL RESULT CALLED TO, READ BACK BY AND VERIFIED WITH: M LILLISTON,PHARMD AT 0919 04/04/17 BY L BENFIELD    Enterobacter cloacae complex NOT DETECTED NOT DETECTED Final   Escherichia coli NOT DETECTED NOT DETECTED Final   Klebsiella oxytoca DETECTED (A) NOT DETECTED Final    Comment: CRITICAL RESULT CALLED TO, READ BACK BY AND VERIFIED WITH: M LILLISTON,PHARMD AT 0919 04/04/17 BY L BENFIELD    Klebsiella pneumoniae NOT DETECTED NOT DETECTED Final   Proteus species NOT DETECTED NOT DETECTED Final   Serratia marcescens NOT DETECTED NOT DETECTED Final   Carbapenem resistance NOT DETECTED NOT DETECTED Final   Haemophilus influenzae NOT DETECTED NOT DETECTED Final   Neisseria meningitidis NOT DETECTED NOT DETECTED Final   Pseudomonas aeruginosa NOT DETECTED NOT DETECTED Final   Candida albicans NOT DETECTED NOT DETECTED Final   Candida glabrata NOT DETECTED NOT DETECTED Final   Candida krusei NOT DETECTED NOT DETECTED Final   Candida parapsilosis NOT DETECTED NOT DETECTED Final   Candida tropicalis NOT DETECTED NOT DETECTED Final    Comment: Performed at [facility], [place] [address] [place], [place] [address]  Culture, blood (Routine X 2) w Reflex to ID Panel     Status: None   Collection Time: 04/06/17 10:44 AM  Result Value Ref Range  Status   Specimen Description BLOOD RIGHT ANTECUBITAL  Final   Special Requests   Final    BOTTLES DRAWN AEROBIC AND ANAEROBIC Blood Culture adequate volume   Culture   Final    NO GROWTH 5 DAYS Performed at [facility], [address] [place], [place] [address]    Report Status  04/11/2017 FINAL  Final  Culture, blood (Routine X 2) w Reflex to ID Panel     Status: None   Collection Time: 04/06/17 10:44 AM  Result Value Ref Range Status   Specimen Description BLOOD LEFT HAND  Final   Special Requests   Final    BOTTLES DRAWN AEROBIC ONLY Blood Culture adequate volume   Culture   Final    NO GROWTH 5 DAYS Performed at [facility], [place] [address], [place], [place] [address]    Report Status 04/11/2017 FINAL  Final  Culture, blood (routine x 2)     Status: None (Preliminary result)   Collection Time: 04/11/17 10:47 AM  Result Value Ref Range Status   Specimen Description BLOOD RIGHT HAND  Final   Special Requests IN PEDIATRIC BOTTLE Blood Culture adequate volume  Final   Culture   Final    NO GROWTH < 24 HOURS Performed at [facility], [place] [address] [place], [place] [address]    Report Status PENDING  Incomplete  Culture, blood (routine x 2)     Status: None (Preliminary result)   Collection Time: 04/11/17 10:58 AM  Result Value Ref Range Status   Specimen Description RIGHT ANTECUBITAL  Final   Special Requests   Final    BOTTLES DRAWN AEROBIC AND ANAEROBIC Blood Culture adequate volume   Culture   Final    NO GROWTH < 24 HOURS Performed at [facility], [place] [address], [place], [place] [address]    Report Status PENDING  Incomplete      Radiology Studies: No results found.   Scheduled Meds: . amiodarone  200 mg Oral QHS  . atorvastatin  80 mg Oral q1800  . furosemide  80 mg Oral TID  . heparin  5,000 Units Subcutaneous Q8H  . insulin aspart  0-15 Units Subcutaneous TID WC  . insulin glargine  12 Units Subcutaneous QHS  . pantoprazole  40 mg Oral Daily  . polyethylene glycol  17 g Oral Daily  . senna  2 tablet Oral BID  . sodium bicarbonate  1,300 mg Oral BID   Continuous Infusions: . cefTRIAXone (ROCEPHIN)  IV Stopped (04/12/17 1232)    Niel Hummer, MD.  [facility] Pager [phone]   If 7PM-7AM,  please contact night-coverage [email or web address] Password [email or web address] 04/12/2017, 1:40 PM

## 2017-04-13 DIAGNOSIS — D509 Iron deficiency anemia, unspecified: Secondary | ICD-10-CM | POA: Diagnosis not present

## 2017-04-13 DIAGNOSIS — C679 Malignant neoplasm of bladder, unspecified: Secondary | ICD-10-CM | POA: Diagnosis not present

## 2017-04-13 DIAGNOSIS — L8990 Pressure ulcer of unspecified site, unspecified stage: Secondary | ICD-10-CM | POA: Diagnosis not present

## 2017-04-13 DIAGNOSIS — Z794 Long term (current) use of insulin: Secondary | ICD-10-CM | POA: Diagnosis not present

## 2017-04-13 DIAGNOSIS — M6281 Muscle weakness (generalized): Secondary | ICD-10-CM | POA: Diagnosis not present

## 2017-04-13 DIAGNOSIS — I509 Heart failure, unspecified: Secondary | ICD-10-CM | POA: Diagnosis not present

## 2017-04-13 DIAGNOSIS — S91105A Unspecified open wound of left lesser toe(s) without damage to nail, initial encounter: Secondary | ICD-10-CM | POA: Diagnosis not present

## 2017-04-13 DIAGNOSIS — R278 Other lack of coordination: Secondary | ICD-10-CM | POA: Diagnosis not present

## 2017-04-13 DIAGNOSIS — I739 Peripheral vascular disease, unspecified: Secondary | ICD-10-CM | POA: Diagnosis not present

## 2017-04-13 DIAGNOSIS — D72829 Elevated white blood cell count, unspecified: Secondary | ICD-10-CM | POA: Diagnosis not present

## 2017-04-13 DIAGNOSIS — I2510 Atherosclerotic heart disease of native coronary artery without angina pectoris: Secondary | ICD-10-CM | POA: Diagnosis not present

## 2017-04-13 DIAGNOSIS — R2689 Other abnormalities of gait and mobility: Secondary | ICD-10-CM | POA: Diagnosis not present

## 2017-04-13 DIAGNOSIS — I480 Paroxysmal atrial fibrillation: Secondary | ICD-10-CM | POA: Diagnosis not present

## 2017-04-13 DIAGNOSIS — A414 Sepsis due to anaerobes: Secondary | ICD-10-CM | POA: Diagnosis not present

## 2017-04-13 DIAGNOSIS — E1122 Type 2 diabetes mellitus with diabetic chronic kidney disease: Secondary | ICD-10-CM | POA: Diagnosis not present

## 2017-04-13 DIAGNOSIS — D631 Anemia in chronic kidney disease: Secondary | ICD-10-CM | POA: Diagnosis not present

## 2017-04-13 DIAGNOSIS — Z8551 Personal history of malignant neoplasm of bladder: Secondary | ICD-10-CM | POA: Diagnosis not present

## 2017-04-13 DIAGNOSIS — E782 Mixed hyperlipidemia: Secondary | ICD-10-CM | POA: Diagnosis not present

## 2017-04-13 DIAGNOSIS — L89621 Pressure ulcer of left heel, stage 1: Secondary | ICD-10-CM | POA: Diagnosis not present

## 2017-04-13 DIAGNOSIS — S91105D Unspecified open wound of left lesser toe(s) without damage to nail, subsequent encounter: Secondary | ICD-10-CM | POA: Diagnosis not present

## 2017-04-13 DIAGNOSIS — M549 Dorsalgia, unspecified: Secondary | ICD-10-CM | POA: Diagnosis not present

## 2017-04-13 DIAGNOSIS — K5900 Constipation, unspecified: Secondary | ICD-10-CM | POA: Diagnosis not present

## 2017-04-13 DIAGNOSIS — I4891 Unspecified atrial fibrillation: Secondary | ICD-10-CM | POA: Diagnosis not present

## 2017-04-13 DIAGNOSIS — Z89519 Acquired absence of unspecified leg below knee: Secondary | ICD-10-CM | POA: Diagnosis not present

## 2017-04-13 DIAGNOSIS — I959 Hypotension, unspecified: Secondary | ICD-10-CM | POA: Diagnosis not present

## 2017-04-13 DIAGNOSIS — T7840XA Allergy, unspecified, initial encounter: Secondary | ICD-10-CM | POA: Diagnosis not present

## 2017-04-13 DIAGNOSIS — E1159 Type 2 diabetes mellitus with other circulatory complications: Secondary | ICD-10-CM | POA: Diagnosis not present

## 2017-04-13 DIAGNOSIS — E46 Unspecified protein-calorie malnutrition: Secondary | ICD-10-CM | POA: Diagnosis not present

## 2017-04-13 DIAGNOSIS — I129 Hypertensive chronic kidney disease with stage 1 through stage 4 chronic kidney disease, or unspecified chronic kidney disease: Secondary | ICD-10-CM | POA: Diagnosis not present

## 2017-04-13 DIAGNOSIS — R531 Weakness: Secondary | ICD-10-CM | POA: Diagnosis not present

## 2017-04-13 DIAGNOSIS — I10 Essential (primary) hypertension: Secondary | ICD-10-CM | POA: Diagnosis not present

## 2017-04-13 DIAGNOSIS — A419 Sepsis, unspecified organism: Secondary | ICD-10-CM | POA: Diagnosis not present

## 2017-04-13 DIAGNOSIS — L89150 Pressure ulcer of sacral region, unstageable: Secondary | ICD-10-CM | POA: Diagnosis not present

## 2017-04-13 DIAGNOSIS — N183 Chronic kidney disease, stage 3 (moderate): Secondary | ICD-10-CM | POA: Diagnosis not present

## 2017-04-13 DIAGNOSIS — N184 Chronic kidney disease, stage 4 (severe): Secondary | ICD-10-CM | POA: Diagnosis not present

## 2017-04-13 DIAGNOSIS — Z89611 Acquired absence of right leg above knee: Secondary | ICD-10-CM | POA: Diagnosis not present

## 2017-04-13 DIAGNOSIS — Z7901 Long term (current) use of anticoagulants: Secondary | ICD-10-CM | POA: Diagnosis not present

## 2017-04-13 DIAGNOSIS — T7840XD Allergy, unspecified, subsequent encounter: Secondary | ICD-10-CM | POA: Diagnosis not present

## 2017-04-13 DIAGNOSIS — N179 Acute kidney failure, unspecified: Secondary | ICD-10-CM | POA: Diagnosis not present

## 2017-04-13 DIAGNOSIS — N390 Urinary tract infection, site not specified: Secondary | ICD-10-CM | POA: Diagnosis not present

## 2017-04-13 LAB — GLUCOSE, CAPILLARY
GLUCOSE-CAPILLARY: 205 mg/dL — AB (ref 65–99)
Glucose-Capillary: 266 mg/dL — ABNORMAL HIGH (ref 65–99)

## 2017-04-13 MED ORDER — OXYCODONE-ACETAMINOPHEN 5-325 MG PO TABS: 1.0000 | ORAL_TABLET | ORAL | 0 refills | Status: AC | PRN

## 2017-04-13 MED ORDER — FUROSEMIDE 80 MG PO TABS
80.0000 mg | ORAL_TABLET | Freq: Three times a day (TID) | ORAL | 0 refills | Status: AC
Start: 2017-04-13 — End: ?

## 2017-04-13 MED ORDER — LANTUS SOLOSTAR 100 UNIT/ML ~~LOC~~ SOPN
12.0000 [IU] | PEN_INJECTOR | Freq: Every day | SUBCUTANEOUS | 11 refills | Status: AC
Start: 2017-04-13 — End: ?

## 2017-04-13 MED ORDER — CEPHALEXIN 500 MG PO CAPS: 500.0000 mg | ORAL_CAPSULE | Freq: Two times a day (BID) | ORAL | 0 refills | Status: AC

## 2017-04-13 MED ORDER — DIPHENHYDRAMINE HCL 25 MG PO CAPS: 25.0000 mg | ORAL_CAPSULE | Freq: Every evening | ORAL | 0 refills | Status: AC | PRN

## 2017-04-13 NOTE — Progress Notes (Signed)
Patient will discharge to [facility]. Anticipated discharge date: 04/13/17 Family notified: Kae Heller, friend Transportation by: Corey Harold  Nurse to call report to [phone].   CSW signing off.  Estanislado Emms, [place]  Clinical Social Worker

## 2017-04-13 NOTE — Discharge Summary (Signed)
Physician Discharge Summary  [name] BMW:[id number] DOB: 04-Jan-1941 DOA: 04/03/2017  PCP: Leighton Ruff, MD  Admit date: 04/03/2017 Discharge date: 04/13/2017  Admitted From: Home  Disposition:  SNF  Recommendations for Outpatient Follow-up:  1. Follow up with PCP in 1-2 weeks 2. Please obtain BMP/CBC in one week 3. Monitor renal function, and urine out put. Adjust lasix as needed.  4. Will benefit from palliative care follow up.    Discharge Condition:Stable.  CODE STATUS: DNR Diet recommendation: Heart Healthy  Brief/Interim Summary: Brief Narrative / Interim history: Pt is a 76 yo female with hx of bladder cancer s/p bladder removal / total hystrectomy with urostomy bag placed 4 years ago complicated with UTIs, comes to the hospital with cc of fatigue, poor appetite, nausea for the past 2-3 days with mild epigastric discomfort w/o fever/chills. No diarrhea/constipation. No cough/chest pain/dyspnea. No other complaints. No open wounds.  Patient's hospital course has been complicated by progressive renal failure as well as decreased urine output.  Nephrology was consulted, and patient was deemed not a dialysis candidate, and she was placed on high-dose IV Lasix.  Palliative has been consulted as well.  She initially responded to high-dose IV Lasix, however on 8/28 her urine output seems to be decreasing and patient is beginning to look a bit more fluid overloaded.  We will increase the IV Lasix on 8/28.  Assessment & Plan: Active Problems:   History of bladder cancer   HTN (hypertension)   CAD (coronary artery disease)   Type 2 diabetes mellitus with vascular disease (HCC)   Status post above knee amputation of right lower extremity (HCC)   Obesity (BMI 30-39.9)   AKI (acute kidney injury) ([place])   Paroxysmal atrial fibrillation (HCC)   Chronic anticoagulation   Sepsis ([place])   Pressure injury of skin   Encounter for palliative care   Goals of care,  counseling/discussion   Sepsis due to UTI / Klebsiella bacteremia -Patient's fever has resolved.  Her blood pressure is now stable -She was initially fluid resuscitated, and when her blood pressure stabilized she was started on IV furosemide -Blood cultures with Klebsiella, narrow to ceftriaxone -Urine culture negative, however suspect UTI to be cause for her bacteremia.  She has no GI symptoms -Surveillance cultures sent on 8/26, no growth  -WBC at 13--15--16--13  Discharge on keflex. Needs 8 more days to complete 2 weeks from last blood culture negative.   Acute kidney injury and chronic kidney disease stage III /fluid overload -Patient was intermittently hypotensive as well, she was fluid resuscitated admission.  She became fluid overloaded and was started on Lasix.  Given progressive renal failure nephrology was consulted.  She is not a dialysis candidate, nephrologist suggested that if she does not respond to Lasix to be considered for palliative care/hospice.  Palliative care consulted. -change to oral lasix. , good urine out put. 1.6 L.  Discharge on 80- mg TID. Monitor renal function.  She will benefit from palliative follow up at SNF  Leukocytosis;  Afebrile.  Had BM.  Blood culture negative. Urine culture no growth  Follow trend.   Anemia -Hemoglobin dropped to 7.7>>7.3 after initially being in the 10 range, this looks dilutional, no evidence of bleeding currently, closely monitor -Hemoglobin stable  Paroxysmal atrial fibrillation -patient's CHA2DS2-VASc Score for Stroke Risk is > 2.  She was initially on Xarelto however that was later changed to Eliquis.  Patient has noticed some hematuria in her urostomy back about a month ago and  has not taken any Eliquis since.  She understands the risks of increasing chances of stroke. Unable to use eliquis due to renal failure.   Type 2 diabetes mellitus, poorly controlled, with renal vascular complications -Continue patient's  Lantus as well as sliding scale.  Hemoglobin A1c 12 Monitor on increase SSI to resistance.   Peripheral arterial disease -Patient is status post BKA amputation on the right.  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  She had a calcaneal ulcer on the left who is now healing.  She has a small partial-thickness wound on the pretibial area on the left with surrounding erythema, wound care was consulted -she is being followed by Dr. Donnetta Hutching. She is status post left femoral to anterior tibial bypass in February 2016  Possible cellulitis left pretibial area -on antibiotics as above, almost resolved Improved.   Coronary artery disease -No reports of chest pain, most recent cardiac evaluation included a 2D echo in April 2017 which showed an EF of 65-70%, moderate LVH, normal wall motions  Hyponatremia -Initially due to dehydration, now with hyponatremia in the setting of fluid overload  Obesity Deep tissue pressure injury to sacrum, present on admission -wound care  Constipation. Had BM   Discharge Diagnoses:  Active Problems:   History of bladder cancer   HTN (hypertension)   CAD (coronary artery disease)   Type 2 diabetes mellitus with vascular disease (HCC)   Status post above knee amputation of right lower extremity (HCC)   Obesity (BMI 30-39.9)   AKI (acute kidney injury) ([place])   Paroxysmal atrial fibrillation (HCC)   Chronic anticoagulation   Sepsis ([place])   Pressure injury of skin   Encounter for palliative care   Goals of care, counseling/discussion    Discharge Instructions  Discharge Instructions    Diet - low sodium heart healthy    Complete by:  As directed    Increase activity slowly    Complete by:  As directed      Allergies as of 04/13/2017      Reactions   Gabapentin Other (See Comments)   Extremely drunk and lethargic   Lisinopril Other (See Comments)   Increased creatinine levels (pt currently taking low dose)   Metformin And  Related Other (See Comments)   Increased creatinine levels   Biaxin [clarithromycin] Itching      Medication List    STOP taking these medications   apixaban 5 MG Tabs tablet Commonly known as:  ELIQUIS   calcitRIOL 0.25 MCG capsule Commonly known as:  ROCALTROL   isosorbide mononitrate 30 MG 24 hr tablet Commonly known as:  IMDUR   potassium chloride SA 20 MEQ tablet Commonly known as:  K-DUR,KLOR-CON     TAKE these medications   acetaminophen 500 MG tablet Commonly known as:  TYLENOL Take 1,000 mg by mouth 2 (two) times daily as needed for mild pain or fever.   amiodarone 200 MG tablet Commonly known as:  PACERONE Take 1 tablet by mouth daily What changed:  how much to take  how to take this  when to take this  additional instructions   atorvastatin 80 MG tablet Commonly known as:  LIPITOR TAKE 1 TABLET (80 MG TOTAL) BY MOUTH EVERY EVENING.   cephALEXin 500 MG capsule Commonly known as:  KEFLEX Take 1 capsule (500 mg total) by mouth 2 (two) times daily.   diphenhydrAMINE 25 mg capsule Commonly known as:  BENADRYL Take 1 capsule (25 mg total) by mouth  at bedtime as needed for sleep.   ferrous sulfate 325 (65 FE) MG tablet Take 325 mg by mouth at bedtime.   furosemide 80 MG tablet Commonly known as:  LASIX Take 1 tablet (80 mg total) by mouth 3 (three) times daily. What changed:  medication strength  when to take this   LANTUS SOLOSTAR 100 UNIT/ML Solostar Pen Generic drug:  Insulin Glargine Inject 12 Units into the skin daily at 10 pm. Dose depends on sliding scale. What changed:  how much to take  when to take this   Magnesium 400 MG Tabs Take 400 mg by mouth at bedtime.   metoprolol succinate 100 MG 24 hr tablet Commonly known as:  TOPROL-XL TAKE 1 TABLET (100 MG TOTAL) BY MOUTH EVERY EVENING. TAKE WITH OR IMMEDIATELY FOLLOWING A MEAL.   NITROSTAT 0.6 MG SL tablet Generic drug:  nitroGLYCERIN Take 0.6 mg by mouth every 5 (five)  minutes x 3 doses as needed.   NOVOLOG FLEXPEN 100 UNIT/ML FlexPen Generic drug:  insulin aspart Inject 0-20 Units into the skin 2 (two) times daily before a meal. Use per sliding scale   omeprazole 20 MG capsule Commonly known as:  PRILOSEC Take 20 mg by mouth every evening.   oxyCODONE-acetaminophen 5-325 MG tablet Commonly known as:  PERCOCET/ROXICET Take 1 tablet by mouth every 4 (four) hours as needed for severe pain.   polyethylene glycol packet Commonly known as:  MIRALAX / GLYCOLAX Take 17 g by mouth at bedtime as needed for mild constipation.   PRESERVISION AREDS PO Take 1 capsule by mouth 2 (two) times daily.   senna 8.6 MG Tabs tablet Commonly known as:  SENOKOT Take 2 tablets by mouth at bedtime as needed for mild constipation (([employer])).   sodium bicarbonate 650 MG tablet Take 1,300 mg by mouth 2 (two) times daily.            Discharge Care Instructions        Start     Ordered   04/13/17 0000  diphenhydrAMINE (BENADRYL) 25 mg capsule  At bedtime PRN     04/13/17 1033   04/13/17 0000  furosemide (LASIX) 80 MG tablet  3 times daily     04/13/17 1033   04/13/17 0000  LANTUS SOLOSTAR 100 UNIT/ML Solostar Pen  Daily at 10 pm     04/13/17 1033   04/13/17 0000  oxyCODONE-acetaminophen (PERCOCET/ROXICET) 5-325 MG tablet  Every 4 hours PRN     04/13/17 1033   04/13/17 0000  cephALEXin (KEFLEX) 500 MG capsule  2 times daily     04/13/17 1033   04/13/17 0000  Increase activity slowly     04/13/17 1033   04/13/17 0000  Diet - low sodium heart healthy     04/13/17 1033      Allergies  Allergen Reactions  . Gabapentin Other (See Comments)    Extremely drunk and lethargic  . Lisinopril Other (See Comments)    Increased creatinine levels (pt currently taking low dose)  . Metformin And Related Other (See Comments)    Increased creatinine levels  . Biaxin [Clarithromycin] Itching    Consultations: Palliative  Procedures/Studies: US  Renal  Result Date: 2017/04/10 CLINICAL DATA:  Renal injury. EXAM: RENAL / URINARY TRACT ULTRASOUND COMPLETE COMPARISON:  Ultrasound of January 17, 2017. FINDINGS: Right Kidney: Length: 11.3 cm. 1.7 cm simple cyst is seen in midpole. Echogenicity within normal limits. No mass or hydronephrosis visualized. Left Kidney: Not well-visualized, presumably due to body  habitus. Bladder: Surgically removed. IMPRESSION: Simple right renal cyst is noted. Otherwise right kidney appears normal. Left kidney is not well-visualized, presumably due to body habitus. CT scan may be performed for further evaluation. Electronically Signed   By: Marijo Conception, M.D.   On: 04/03/2017 20:40   Dg Chest Port 1 View  Result Date: 04/03/2017 CLINICAL DATA:  Weakness for 3 days. EXAM: PORTABLE CHEST 1 VIEW COMPARISON:  Single-view of the chest 11/22/2015. CT chest 11/15/2015. PA and lateral chest 11/14/2015. FINDINGS: Mild, chronic scar in the right base is unchanged. Lungs otherwise clear. Heart size is normal. No pneumothorax or pleural fluid. Aortic atherosclerosis is noted. No acute bony abnormality. IMPRESSION: No acute disease. Atherosclerosis. Electronically Signed   By: Inge Rise M.D.   On: 04/03/2017 16:14      Subjective: Feeling ok, dyspnea improved. Complaints of back pain   Discharge Exam: Vitals:   04/12/17 2103 04/13/17 0500  BP: (!) 142/60 123/61  Pulse: 76 77  Resp: 17 16  Temp: 97.6 F (36.4 C) 97.6 F (36.4 C)  SpO2: 100% 99%   Vitals:   04/12/17 0559 04/12/17 1407 04/12/17 2103 04/13/17 0500  BP: (!) 129/46 (!) 123/36 (!) 142/60 123/61  Pulse: 77 75 76 77  Resp: 16 16 17 16   Temp: 97.7 F (36.5 C) 97.6 F (36.4 C) 97.6 F (36.4 C) 97.6 F (36.4 C)  TempSrc: Oral Oral Oral Oral  SpO2: 99% 99% 100% 99%  Weight: 120.2 kg (265 lb)   124.3 kg (274 lb)  Height:        General: Pt is alert, awake, not in acute distress Cardiovascular: RRR, S1/S2 +, no rubs, no gallops Respiratory: CTA  bilaterally, no wheezing, no rhonchi Abdominal: Soft, NT, ND, bowel sounds + Extremities: no edema, no cyanosis    The results of significant diagnostics from this hospitalization (including imaging, microbiology, ancillary and laboratory) are listed below for reference.     Microbiology: Recent Results (from the past 240 hour(s))  Culture, Urine     Status: Abnormal   Collection Time: 04/03/17  3:40 PM  Result Value [place] Status   Specimen Description URINE, RANDOM  Final   Special Requests NONE  Final   Culture MULTIPLE SPECIES PRESENT, SUGGEST RECOLLECTION (A)  Final   Report Status 04/05/2017 FINAL  Final  Blood Culture (routine x 2)     Status: Abnormal   Collection Time: 04/03/17  4:34 PM  Result Value [place] Status   Specimen Description BLOOD BLOOD RIGHT FOREARM  Final   Special Requests   Final    BOTTLES DRAWN AEROBIC AND ANAEROBIC Blood Culture adequate volume   Culture  Setup Time   Final    GRAM NEGATIVE RODS IN BOTH AEROBIC AND ANAEROBIC BOTTLES CRITICAL RESULT CALLED TO, READ BACK BY AND VERIFIED WITH: A RUNYON,PHARMD AT 12330 04/04/17 BY L BENFIELD    Culture (A)  Final    KLEBSIELLA OXYTOCA SUSCEPTIBILITIES PERFORMED ON PREVIOUS CULTURE WITHIN THE LAST 5 DAYS. Performed at [facility], [place] [address] [place], [place] [address]    Report Status 04/07/2017 FINAL  Final  Blood Culture (routine x 2)     Status: Abnormal   Collection Time: 04/03/17  5:49 PM  Result Value [place] Status   Specimen Description BLOOD BLOOD LEFT FOREARM  Final   Special Requests   Final    BOTTLES DRAWN AEROBIC AND ANAEROBIC Blood Culture results may not be optimal due to an  inadequate volume of blood received in culture bottles   Culture  Setup Time   Final    GRAM NEGATIVE RODS IN BOTH AEROBIC AND ANAEROBIC BOTTLES CRITICAL RESULT CALLED TO, READ BACK BY AND VERIFIED WITH: Irwin Brakeman AT 3329 04/04/17 BY L BENFIELD Performed at [facility], [address] [place], [place] [address]    Culture KLEBSIELLA OXYTOCA (A)  Final   Report Status 04/06/2017 FINAL  Final   Organism ID, Bacteria KLEBSIELLA OXYTOCA  Final      Susceptibility   Klebsiella oxytoca - MIC*    AMPICILLIN >=32 RESISTANT Resistant     CEFAZOLIN 8 SENSITIVE Sensitive     CEFEPIME <=1 SENSITIVE Sensitive     CEFTAZIDIME <=1 SENSITIVE Sensitive     CEFTRIAXONE <=1 SENSITIVE Sensitive     CIPROFLOXACIN <=0.25 SENSITIVE Sensitive     GENTAMICIN <=1 SENSITIVE Sensitive     IMIPENEM <=0.25 SENSITIVE Sensitive     TRIMETH/SULFA <=20 SENSITIVE Sensitive     AMPICILLIN/SULBACTAM 16 INTERMEDIATE Intermediate     PIP/TAZO <=4 SENSITIVE Sensitive     Extended ESBL NEGATIVE Sensitive     * KLEBSIELLA OXYTOCA  Blood Culture ID Panel (Reflexed)     Status: Abnormal   Collection Time: 04/03/17  5:49 PM  Result Value [place] Status   Enterococcus species NOT DETECTED NOT DETECTED Final   Listeria monocytogenes NOT DETECTED NOT DETECTED Final   Staphylococcus species NOT DETECTED NOT DETECTED Final   Staphylococcus aureus NOT DETECTED NOT DETECTED Final   Streptococcus species NOT DETECTED NOT DETECTED Final   Streptococcus agalactiae NOT DETECTED NOT DETECTED Final   Streptococcus pneumoniae NOT DETECTED NOT DETECTED Final   Streptococcus pyogenes NOT DETECTED NOT DETECTED Final   Acinetobacter baumannii NOT DETECTED NOT DETECTED Final   Enterobacteriaceae species DETECTED (A) NOT DETECTED Final    Comment: Enterobacteriaceae represent a large family of gram-negative bacteria, not a single organism. CRITICAL RESULT CALLED TO, READ BACK BY AND VERIFIED WITH: M LILLISTON,PHARMD AT 0919 04/04/17 BY L BENFIELD    Enterobacter cloacae complex NOT DETECTED NOT DETECTED Final   Escherichia coli NOT DETECTED NOT DETECTED Final   Klebsiella oxytoca DETECTED (A) NOT DETECTED Final    Comment: CRITICAL RESULT CALLED TO, READ BACK BY AND VERIFIED WITH: M LILLISTON,PHARMD AT 0919  04/04/17 BY L BENFIELD    Klebsiella pneumoniae NOT DETECTED NOT DETECTED Final   Proteus species NOT DETECTED NOT DETECTED Final   Serratia marcescens NOT DETECTED NOT DETECTED Final   Carbapenem resistance NOT DETECTED NOT DETECTED Final   Haemophilus influenzae NOT DETECTED NOT DETECTED Final   Neisseria meningitidis NOT DETECTED NOT DETECTED Final   Pseudomonas aeruginosa NOT DETECTED NOT DETECTED Final   Candida albicans NOT DETECTED NOT DETECTED Final   Candida glabrata NOT DETECTED NOT DETECTED Final   Candida krusei NOT DETECTED NOT DETECTED Final   Candida parapsilosis NOT DETECTED NOT DETECTED Final   Candida tropicalis NOT DETECTED NOT DETECTED Final    Comment: Performed at [facility], [place] [address] [place], [place] [address]  Culture, blood (Routine X 2) w Reflex to ID Panel     Status: None   Collection Time: 04/06/17 10:44 AM  Result Value [place] Status   Specimen Description BLOOD RIGHT ANTECUBITAL  Final   Special Requests   Final    BOTTLES DRAWN AEROBIC AND ANAEROBIC Blood Culture adequate volume   Culture   Final    NO GROWTH 5 DAYS  Performed at [facility], [place] [address], [place], [place] [address]    Report Status 04/11/2017 FINAL  Final  Culture, blood (Routine X 2) w Reflex to ID Panel     Status: None   Collection Time: 04/06/17 10:44 AM  Result Value [place] Status   Specimen Description BLOOD LEFT HAND  Final   Special Requests   Final    BOTTLES DRAWN AEROBIC ONLY Blood Culture adequate volume   Culture   Final    NO GROWTH 5 DAYS Performed at [facility], [place] [address], [place], [place] [address]    Report Status 04/11/2017 FINAL  Final  Culture, blood (routine x 2)     Status: None (Preliminary result)   Collection Time: 04/11/17 10:47 AM  Result Value [place] Status   Specimen Description BLOOD RIGHT HAND  Final   Special Requests IN PEDIATRIC BOTTLE Blood Culture adequate volume  Final   Culture   Final    NO  GROWTH < 24 HOURS Performed at [facility], [place] [address], [place], [place] [address]    Report Status PENDING  Incomplete  Culture, blood (routine x 2)     Status: None (Preliminary result)   Collection Time: 04/11/17 10:58 AM  Result Value [place] Status   Specimen Description RIGHT ANTECUBITAL  Final   Special Requests   Final    BOTTLES DRAWN AEROBIC AND ANAEROBIC Blood Culture adequate volume   Culture   Final    NO GROWTH < 24 HOURS Performed at [facility], [place] [address], [place], [place] [address]    Report Status PENDING  Incomplete  Urine Culture     Status: None   Collection Time: 04/11/17  2:31 PM  Result Value [place] Status   Specimen Description URINE, RANDOM  Final   Special Requests NONE  Final   Culture   Final    NO GROWTH Performed at [facility], [place] [address], [place], [place] [address]    Report Status 04/12/2017 FINAL  Final     Labs: BNP (last 3 results) No results for input(s): BNP in the last 8760 hours. Basic Metabolic Panel:  Recent Labs Lab 04/08/17 0507 04/09/17 0512 04/10/17 0515 04/11/17 0517 04/12/17 0628  NA 131* 130* 132* 133* 132*  K 4.4 4.0 4.5 4.1 4.3  CL 101 100* 101 101 100*  CO2 20* 20* 20* 25 24  GLUCOSE 252* 242* 181* 232* 277*  BUN 84* 81* 80* 78* 74*  CREATININE 4.44* 4.07* 3.67* 3.36* 3.28*  CALCIUM 8.6* 8.7* 8.7* 9.0 8.8*   Liver Function Tests: No results for input(s): AST, ALT, ALKPHOS, BILITOT, PROT, ALBUMIN in the last 168 hours. No results for input(s): LIPASE, AMYLASE in the last 168 hours. No results for input(s): AMMONIA in the last 168 hours. CBC:  Recent Labs Lab 04/08/17 0507 04/09/17 0512 04/10/17 0622 04/11/17 0517 04/12/17 0628  WBC 12.3* 13.4* 15.2* 16.5* 13.0*  NEUTROABS  --   --   --   --  10.1*  HGB 7.8* 7.6* 7.8* 8.1* 8.6*  HCT 24.4* 23.7* 24.7* 25.5* 27.7*  MCV 77.2* 75.7* 76.5* 75.4* 77.2*  PLT 286 311 334 382 387   Cardiac Enzymes: No results for  input(s): CKTOTAL, CKMB, CKMBINDEX, TROPONINI in the last 168 hours. BNP: Invalid input(s): POCBNP CBG:  Recent Labs Lab 04/11/17 1951 04/12/17 0713 04/12/17 1110 04/12/17 1635 04/13/17 0728  GLUCAP 228* 279* 249* 212*  266*   D-Dimer No results for input(s): DDIMER in the last 72 hours. Hgb A1c No results for input(s): HGBA1C in the last 72 hours. Lipid Profile No results for input(s): CHOL, HDL, LDLCALC, TRIG, CHOLHDL, LDLDIRECT in the last 72 hours. Thyroid function studies No results for input(s): TSH, T4TOTAL, T3FREE, THYROIDAB in the last 72 hours.  Invalid input(s): FREET3 Anemia work up No results for input(s): VITAMINB12, FOLATE, FERRITIN, TIBC, IRON, RETICCTPCT in the last 72 hours. Urinalysis    Component Value Date/Time   COLORURINE YELLOW 04/11/2017 1431   APPEARANCEUR CLEAR 04/11/2017 1431   LABSPEC 1.009 04/11/2017 1431   PHURINE 7.0 04/11/2017 1431   GLUCOSEU NEGATIVE 04/11/2017 1431   HGBUR SMALL (A) 04/11/2017 1431   BILIRUBINUR NEGATIVE 04/11/2017 1431   KETONESUR NEGATIVE 04/11/2017 1431   PROTEINUR 100 (A) 04/11/2017 1431   UROBILINOGEN 0.2 09/12/2014 0949   NITRITE NEGATIVE 04/11/2017 1431   LEUKOCYTESUR LARGE (A) 04/11/2017 1431   Sepsis Labs Invalid input(s): PROCALCITONIN,  WBC,  LACTICIDVEN Microbiology Recent Results (from the past 240 hour(s))  Culture, Urine     Status: Abnormal   Collection Time: 04/03/17  3:40 PM  Result Value [place] Status   Specimen Description URINE, RANDOM  Final   Special Requests NONE  Final   Culture MULTIPLE SPECIES PRESENT, SUGGEST RECOLLECTION (A)  Final   Report Status 04/05/2017 FINAL  Final  Blood Culture (routine x 2)     Status: Abnormal   Collection Time: 04/03/17  4:34 PM  Result Value [place] Status   Specimen Description BLOOD BLOOD RIGHT FOREARM  Final   Special Requests   Final    BOTTLES DRAWN AEROBIC AND ANAEROBIC Blood Culture adequate volume   Culture  Setup Time   Final    GRAM  NEGATIVE RODS IN BOTH AEROBIC AND ANAEROBIC BOTTLES CRITICAL RESULT CALLED TO, READ BACK BY AND VERIFIED WITH: A RUNYON,PHARMD AT 12330 04/04/17 BY L BENFIELD    Culture (A)  Final    KLEBSIELLA OXYTOCA SUSCEPTIBILITIES PERFORMED ON PREVIOUS CULTURE WITHIN THE LAST 5 DAYS. Performed at [facility], [place] [address], [place], [place] [address]    Report Status 04/07/2017 FINAL  Final  Blood Culture (routine x 2)     Status: Abnormal   Collection Time: 04/03/17  5:49 PM  Result Value [place] Status   Specimen Description BLOOD BLOOD LEFT FOREARM  Final   Special Requests   Final    BOTTLES DRAWN AEROBIC AND ANAEROBIC Blood Culture results may not be optimal due to an inadequate volume of blood received in culture bottles   Culture  Setup Time   Final    GRAM NEGATIVE RODS IN BOTH AEROBIC AND ANAEROBIC BOTTLES CRITICAL RESULT CALLED TO, READ BACK BY AND VERIFIED WITH: Irwin Brakeman AT 8841 04/04/17 BY L BENFIELD Performed at [facility], [place] [address], [place], [place] [address]    Culture KLEBSIELLA OXYTOCA (A)  Final   Report Status 04/06/2017 FINAL  Final   Organism ID, Bacteria KLEBSIELLA OXYTOCA  Final      Susceptibility   Klebsiella oxytoca - MIC*    AMPICILLIN >=32 RESISTANT Resistant     CEFAZOLIN 8 SENSITIVE Sensitive     CEFEPIME <=1 SENSITIVE Sensitive     CEFTAZIDIME <=1 SENSITIVE Sensitive     CEFTRIAXONE <=1 SENSITIVE Sensitive     CIPROFLOXACIN <=0.25 SENSITIVE Sensitive     GENTAMICIN <=1 SENSITIVE Sensitive     IMIPENEM <=0.25 SENSITIVE Sensitive  TRIMETH/SULFA <=20 SENSITIVE Sensitive     AMPICILLIN/SULBACTAM 16 INTERMEDIATE Intermediate     PIP/TAZO <=4 SENSITIVE Sensitive     Extended ESBL NEGATIVE Sensitive     * KLEBSIELLA OXYTOCA  Blood Culture ID Panel (Reflexed)     Status: Abnormal   Collection Time: 04/03/17  5:49 PM  Result Value [place] Status   Enterococcus species NOT DETECTED NOT DETECTED Final   Listeria monocytogenes  NOT DETECTED NOT DETECTED Final   Staphylococcus species NOT DETECTED NOT DETECTED Final   Staphylococcus aureus NOT DETECTED NOT DETECTED Final   Streptococcus species NOT DETECTED NOT DETECTED Final   Streptococcus agalactiae NOT DETECTED NOT DETECTED Final   Streptococcus pneumoniae NOT DETECTED NOT DETECTED Final   Streptococcus pyogenes NOT DETECTED NOT DETECTED Final   Acinetobacter baumannii NOT DETECTED NOT DETECTED Final   Enterobacteriaceae species DETECTED (A) NOT DETECTED Final    Comment: Enterobacteriaceae represent a large family of gram-negative bacteria, not a single organism. CRITICAL RESULT CALLED TO, READ BACK BY AND VERIFIED WITH: M LILLISTON,PHARMD AT 0919 04/04/17 BY L BENFIELD    Enterobacter cloacae complex NOT DETECTED NOT DETECTED Final   Escherichia coli NOT DETECTED NOT DETECTED Final   Klebsiella oxytoca DETECTED (A) NOT DETECTED Final    Comment: CRITICAL RESULT CALLED TO, READ BACK BY AND VERIFIED WITH: M LILLISTON,PHARMD AT 0919 04/04/17 BY L BENFIELD    Klebsiella pneumoniae NOT DETECTED NOT DETECTED Final   Proteus species NOT DETECTED NOT DETECTED Final   Serratia marcescens NOT DETECTED NOT DETECTED Final   Carbapenem resistance NOT DETECTED NOT DETECTED Final   Haemophilus influenzae NOT DETECTED NOT DETECTED Final   Neisseria meningitidis NOT DETECTED NOT DETECTED Final   Pseudomonas aeruginosa NOT DETECTED NOT DETECTED Final   Candida albicans NOT DETECTED NOT DETECTED Final   Candida glabrata NOT DETECTED NOT DETECTED Final   Candida krusei NOT DETECTED NOT DETECTED Final   Candida parapsilosis NOT DETECTED NOT DETECTED Final   Candida tropicalis NOT DETECTED NOT DETECTED Final    Comment: Performed at [facility], [place] [address], [place], [place] [address]  Culture, blood (Routine X 2) w Reflex to ID Panel     Status: None   Collection Time: 04/06/17 10:44 AM  Result Value Ref Range Status   Specimen Description BLOOD RIGHT ANTECUBITAL   Final   Special Requests   Final    BOTTLES DRAWN AEROBIC AND ANAEROBIC Blood Culture adequate volume   Culture   Final    NO GROWTH 5 DAYS Performed at [facility], [place] [address], [place], [place] [address]    Report Status 04/11/2017 FINAL  Final  Culture, blood (Routine X 2) w Reflex to ID Panel     Status: None   Collection Time: 04/06/17 10:44 AM  Result Value Ref Range Status   Specimen Description BLOOD LEFT HAND  Final   Special Requests   Final    BOTTLES DRAWN AEROBIC ONLY Blood Culture adequate volume   Culture   Final    NO GROWTH 5 DAYS Performed at [facility], [place] [address] [place], [place] [address]    Report Status 04/11/2017 FINAL  Final  Culture, blood (routine x 2)     Status: None (Preliminary result)   Collection Time: 04/11/17 10:47 AM  Result Value Ref Range Status   Specimen Description BLOOD RIGHT HAND  Final   Special Requests IN PEDIATRIC BOTTLE Blood Culture adequate volume  Final  Culture   Final    NO GROWTH < 24 HOURS Performed at [facility], [place] [address], [place], [place] [address]    Report Status PENDING  Incomplete  Culture, blood (routine x 2)     Status: None (Preliminary result)   Collection Time: 04/11/17 10:58 AM  Result Value Ref Range Status   Specimen Description RIGHT ANTECUBITAL  Final   Special Requests   Final    BOTTLES DRAWN AEROBIC AND ANAEROBIC Blood Culture adequate volume   Culture   Final    NO GROWTH < 24 HOURS Performed at [facility], [place] [address], [place], [place] [address]    Report Status PENDING  Incomplete  Urine Culture     Status: None   Collection Time: 04/11/17  2:31 PM  Result Value Ref Range Status   Specimen Description URINE, RANDOM  Final   Special Requests NONE  Final   Culture   Final    NO GROWTH Performed at [facility], [place] [address] [place], [place] [address]    Report Status 04/12/2017 FINAL  Final     Time coordinating discharge: Over 30  minutes  SIGNED:   Elmarie Shiley, MD  [facility] 04/13/2017, 10:33 AM Pager [phone]  If 7PM-7AM, please contact night-coverage [email or web address] Password [email or web address]

## 2017-04-13 NOTE — Clinical Social Work Placement (Signed)
   CLINICAL SOCIAL WORK PLACEMENT  NOTE  Date:  04/13/2017  Patient Details  Name: [name] MRN: [id number] Date of Birth: 11-21-1940  Clinical Social Work is seeking post-discharge placement for this patient at the [place] level of care (*CSW will initial, date and re-position this form in  chart as items are completed):  Yes   Patient/family provided with [place] Work Department's list of facilities offering this level of care within the geographic area requested by the patient (or if unable, by the patient's family).  Yes   Patient/family informed of their freedom to choose among providers that offer the needed level of care, that participate in Medicare, Medicaid or managed care program needed by the patient, have an available bed and are willing to accept the patient.  Yes   Patient/family informed of [facility]'s ownership interest in [facility] and [facility], as well as of the fact that they are under no obligation to receive care at these facilities.  PASRR submitted to EDS on       PASRR number received on       Existing PASRR number confirmed on 04/06/17     FL2 transmitted to all facilities in geographic area requested by pt/family on 04/06/17     FL2 transmitted to all facilities within larger geographic area on       Patient informed that his/her managed care company has contracts with or will negotiate with certain facilities, including the following:  [place]     Yes   Patient/family informed of bed offers received.  Patient chooses bed at [facility]     Physician recommends and patient chooses bed at      Patient to be transferred to [facility] on 04/13/17.  Patient to be transferred to facility by [facility]     Patient family notified on 04/13/17 of transfer.  Name of family member notified:        PHYSICIAN       Additional Comment:     _______________________________________________ Estanislado Emms, LCSW 04/13/2017, 11:24 AM

## 2017-04-13 NOTE — Progress Notes (Signed)
Attempted to call report 2 times.  Left number on packet to call and get report.

## 2017-04-15 DIAGNOSIS — I480 Paroxysmal atrial fibrillation: Secondary | ICD-10-CM | POA: Diagnosis not present

## 2017-04-15 DIAGNOSIS — N390 Urinary tract infection, site not specified: Secondary | ICD-10-CM | POA: Diagnosis not present

## 2017-04-15 DIAGNOSIS — N179 Acute kidney failure, unspecified: Secondary | ICD-10-CM | POA: Diagnosis not present

## 2017-04-15 DIAGNOSIS — I739 Peripheral vascular disease, unspecified: Secondary | ICD-10-CM | POA: Diagnosis not present

## 2017-04-15 LAB — GLUCOSE, CAPILLARY: GLUCOSE-CAPILLARY: 260 mg/dL — AB (ref 65–99)

## 2017-04-16 LAB — CULTURE, BLOOD (ROUTINE X 2)
CULTURE: NO GROWTH
Culture: NO GROWTH
SPECIAL REQUESTS: ADEQUATE
SPECIAL REQUESTS: ADEQUATE

## 2017-04-17 ENCOUNTER — Ambulatory Visit: Payer: Self-pay | Admitting: Pharmacist

## 2017-04-18 DIAGNOSIS — N184 Chronic kidney disease, stage 4 (severe): Secondary | ICD-10-CM | POA: Diagnosis not present

## 2017-04-18 DIAGNOSIS — I10 Essential (primary) hypertension: Secondary | ICD-10-CM | POA: Diagnosis not present

## 2017-04-18 DIAGNOSIS — E46 Unspecified protein-calorie malnutrition: Secondary | ICD-10-CM | POA: Diagnosis not present

## 2017-04-18 DIAGNOSIS — N179 Acute kidney failure, unspecified: Secondary | ICD-10-CM | POA: Diagnosis not present

## 2017-04-18 DIAGNOSIS — I2510 Atherosclerotic heart disease of native coronary artery without angina pectoris: Secondary | ICD-10-CM | POA: Diagnosis not present

## 2017-04-18 DIAGNOSIS — N390 Urinary tract infection, site not specified: Secondary | ICD-10-CM | POA: Diagnosis not present

## 2017-04-18 DIAGNOSIS — Z8551 Personal history of malignant neoplasm of bladder: Secondary | ICD-10-CM | POA: Diagnosis not present

## 2017-04-18 DIAGNOSIS — A419 Sepsis, unspecified organism: Secondary | ICD-10-CM | POA: Diagnosis not present

## 2017-04-22 ENCOUNTER — Other Ambulatory Visit: Payer: Self-pay | Admitting: Pharmacist

## 2017-04-22 ENCOUNTER — Ambulatory Visit: Payer: Self-pay | Admitting: Pharmacist

## 2017-04-22 DIAGNOSIS — E1122 Type 2 diabetes mellitus with diabetic chronic kidney disease: Secondary | ICD-10-CM | POA: Diagnosis not present

## 2017-04-22 DIAGNOSIS — N179 Acute kidney failure, unspecified: Secondary | ICD-10-CM | POA: Diagnosis not present

## 2017-04-22 DIAGNOSIS — I2510 Atherosclerotic heart disease of native coronary artery without angina pectoris: Secondary | ICD-10-CM | POA: Diagnosis not present

## 2017-04-22 DIAGNOSIS — N183 Chronic kidney disease, stage 3 (moderate): Secondary | ICD-10-CM | POA: Diagnosis not present

## 2017-04-22 IMAGING — CR DG OS CALCIS 2+V*L*
1 series · 2 of 2 positions shown · non-contrast
Comparison: None.

CLINICAL DATA: Nonhealing wound on the plantar aspect of the heel,
neuropathy

EXAM:
LEFT OS CALCIS - 2+ VIEW

[Series 1: dg os calcis left · 0.14mm/px · 2 of 2 slices shown]
[im 1/2]
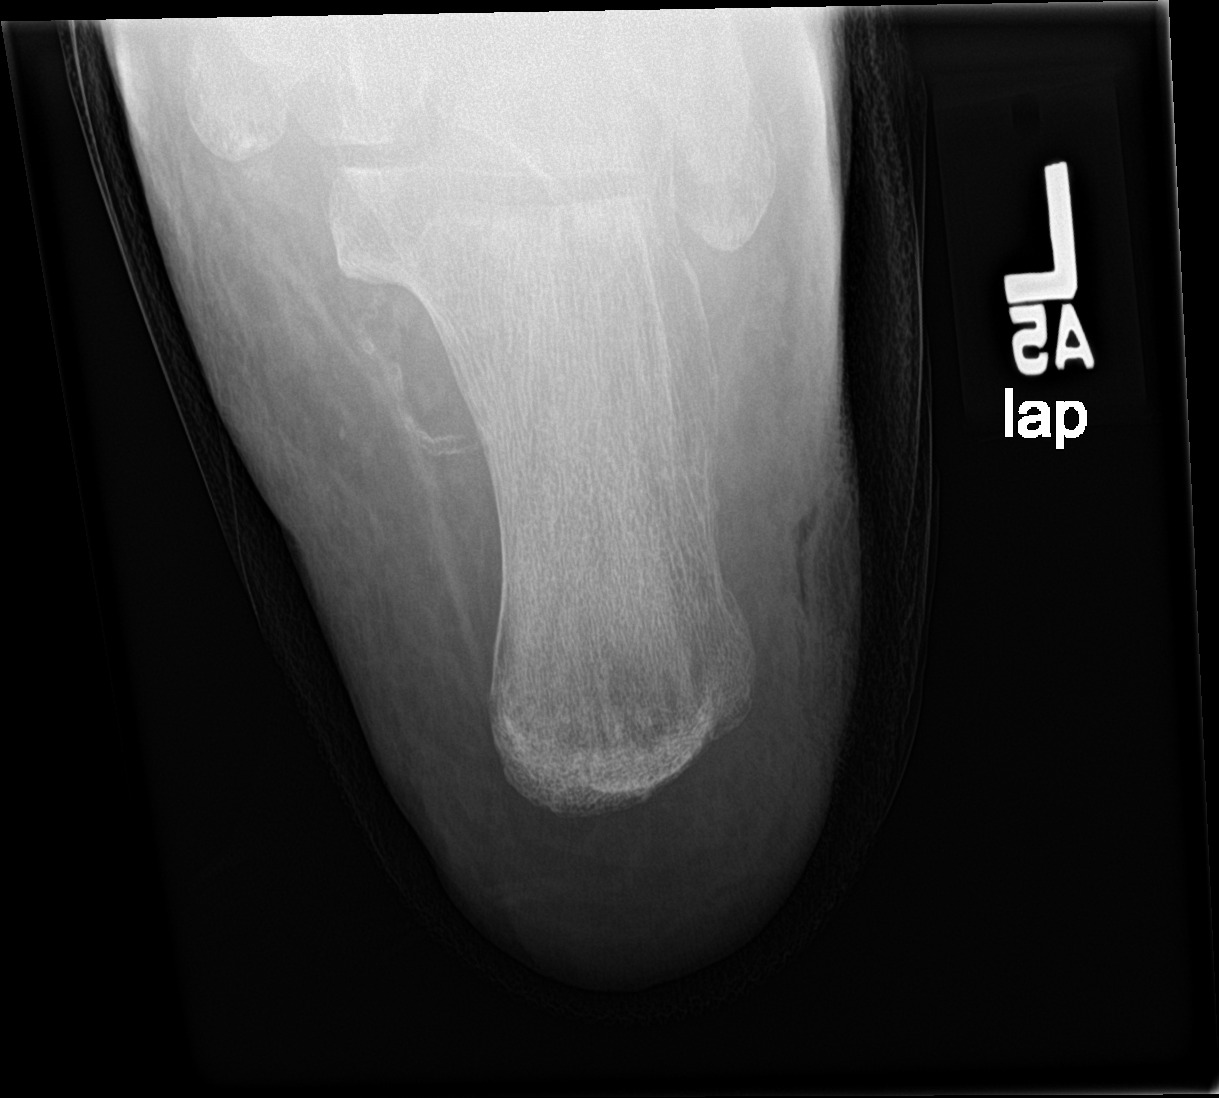
[im 2/2]
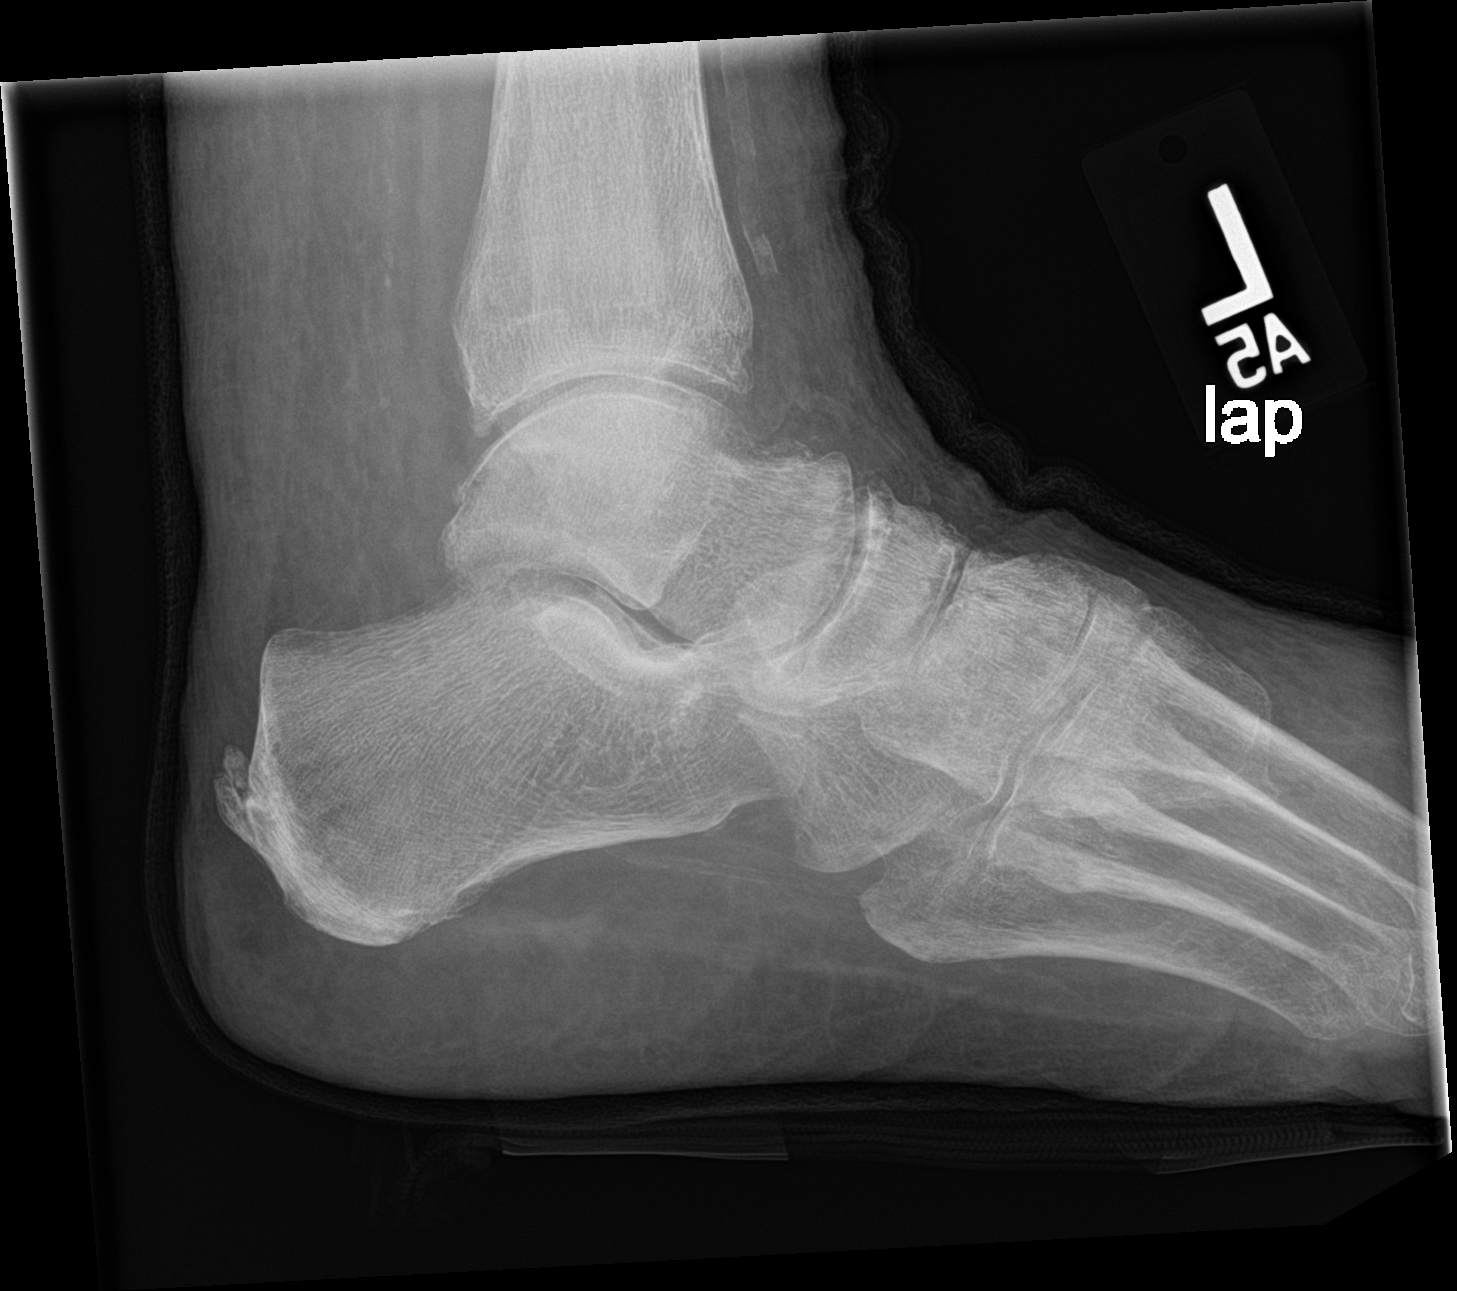

[2 of 2 positions shown; findings below may reference images not displayed]

FINDINGS: No definite radiographic evidence of osteomyelitis is seen. However
near the wound along the posterior plantar heel, there is a small
focus of demineralization and very early osteomyelitis cannot be
excluded. A degenerative spur is noted at the insertion of the
Achilles tendon. Mild degenerative changes noted in the mid feet.
IMPRESSION: Although there is no definite evidence of osteomyelitis, a small
focus of demineralization involving the plantar aspect of calcaneus
could indicate very early osteomyelitis. Consider MR if warranted.

## 2017-04-22 NOTE — Patient Outreach (Signed)
[facility]) Care Management  04/22/2017  [name] 05-Jul-1941 [id number]   Patient was called to follow up on medication assistance with Eliquis and to follow up with her post discharge. Patient did not answer the phone. A woman answered and said patient was not at home.  HIPAA compliant message was left for the patient.  Plan:  Call patient back in 3-5 business days if I do not hear back from the patient sooner.   [name], PharmD, [place] Clinical Pharmacist [phone]

## 2017-04-23 DIAGNOSIS — S91105A Unspecified open wound of left lesser toe(s) without damage to nail, initial encounter: Secondary | ICD-10-CM | POA: Diagnosis not present

## 2017-04-23 DIAGNOSIS — L89621 Pressure ulcer of left heel, stage 1: Secondary | ICD-10-CM | POA: Diagnosis not present

## 2017-04-23 DIAGNOSIS — L89150 Pressure ulcer of sacral region, unstageable: Secondary | ICD-10-CM | POA: Diagnosis not present

## 2017-04-24 ENCOUNTER — Other Ambulatory Visit: Payer: Self-pay

## 2017-04-24 NOTE — Addendum Note (Signed)
Addended by: Lianne Cure A on: 04/24/2017 12:08 PM   Modules accepted: Orders

## 2017-04-25 DIAGNOSIS — K5900 Constipation, unspecified: Secondary | ICD-10-CM | POA: Diagnosis not present

## 2017-04-25 DIAGNOSIS — N390 Urinary tract infection, site not specified: Secondary | ICD-10-CM | POA: Diagnosis not present

## 2017-04-25 DIAGNOSIS — I480 Paroxysmal atrial fibrillation: Secondary | ICD-10-CM | POA: Diagnosis not present

## 2017-04-25 DIAGNOSIS — I10 Essential (primary) hypertension: Secondary | ICD-10-CM | POA: Diagnosis not present

## 2017-04-28 DIAGNOSIS — R531 Weakness: Secondary | ICD-10-CM | POA: Diagnosis not present

## 2017-04-29 ENCOUNTER — Other Ambulatory Visit (HOSPITAL_COMMUNITY): Payer: PPO

## 2017-04-29 ENCOUNTER — Ambulatory Visit: Payer: PPO | Admitting: Family

## 2017-04-29 ENCOUNTER — Encounter (HOSPITAL_COMMUNITY): Payer: PPO

## 2017-04-29 DIAGNOSIS — I4891 Unspecified atrial fibrillation: Secondary | ICD-10-CM | POA: Diagnosis not present

## 2017-04-29 DIAGNOSIS — N184 Chronic kidney disease, stage 4 (severe): Secondary | ICD-10-CM | POA: Diagnosis not present

## 2017-04-29 DIAGNOSIS — Z89519 Acquired absence of unspecified leg below knee: Secondary | ICD-10-CM | POA: Diagnosis not present

## 2017-04-29 DIAGNOSIS — D509 Iron deficiency anemia, unspecified: Secondary | ICD-10-CM | POA: Diagnosis not present

## 2017-04-29 DIAGNOSIS — E782 Mixed hyperlipidemia: Secondary | ICD-10-CM | POA: Diagnosis not present

## 2017-04-29 DIAGNOSIS — C679 Malignant neoplasm of bladder, unspecified: Secondary | ICD-10-CM | POA: Diagnosis not present

## 2017-04-29 DIAGNOSIS — I2510 Atherosclerotic heart disease of native coronary artery without angina pectoris: Secondary | ICD-10-CM | POA: Diagnosis not present

## 2017-04-29 DIAGNOSIS — A414 Sepsis due to anaerobes: Secondary | ICD-10-CM | POA: Diagnosis not present

## 2017-04-29 DIAGNOSIS — I509 Heart failure, unspecified: Secondary | ICD-10-CM | POA: Diagnosis not present

## 2017-04-29 DIAGNOSIS — D631 Anemia in chronic kidney disease: Secondary | ICD-10-CM | POA: Diagnosis not present

## 2017-04-29 DIAGNOSIS — I129 Hypertensive chronic kidney disease with stage 1 through stage 4 chronic kidney disease, or unspecified chronic kidney disease: Secondary | ICD-10-CM | POA: Diagnosis not present

## 2017-04-30 DIAGNOSIS — L89621 Pressure ulcer of left heel, stage 1: Secondary | ICD-10-CM | POA: Diagnosis not present

## 2017-04-30 DIAGNOSIS — S91105D Unspecified open wound of left lesser toe(s) without damage to nail, subsequent encounter: Secondary | ICD-10-CM | POA: Diagnosis not present

## 2017-04-30 DIAGNOSIS — L89150 Pressure ulcer of sacral region, unstageable: Secondary | ICD-10-CM | POA: Diagnosis not present

## 2017-05-02 DIAGNOSIS — E1122 Type 2 diabetes mellitus with diabetic chronic kidney disease: Secondary | ICD-10-CM | POA: Diagnosis not present

## 2017-05-02 DIAGNOSIS — N179 Acute kidney failure, unspecified: Secondary | ICD-10-CM | POA: Diagnosis not present

## 2017-05-02 DIAGNOSIS — I480 Paroxysmal atrial fibrillation: Secondary | ICD-10-CM | POA: Diagnosis not present

## 2017-05-02 DIAGNOSIS — N183 Chronic kidney disease, stage 3 (moderate): Secondary | ICD-10-CM | POA: Diagnosis not present

## 2017-05-05 ENCOUNTER — Other Ambulatory Visit: Payer: Self-pay

## 2017-05-05 DIAGNOSIS — R531 Weakness: Secondary | ICD-10-CM | POA: Diagnosis not present

## 2017-05-05 DIAGNOSIS — N185 Chronic kidney disease, stage 5: Secondary | ICD-10-CM

## 2017-05-05 DIAGNOSIS — Z01810 Encounter for preprocedural cardiovascular examination: Secondary | ICD-10-CM

## 2017-05-06 DIAGNOSIS — N183 Chronic kidney disease, stage 3 (moderate): Secondary | ICD-10-CM | POA: Diagnosis not present

## 2017-05-06 DIAGNOSIS — T7840XA Allergy, unspecified, initial encounter: Secondary | ICD-10-CM | POA: Diagnosis not present

## 2017-05-06 DIAGNOSIS — C679 Malignant neoplasm of bladder, unspecified: Secondary | ICD-10-CM | POA: Diagnosis not present

## 2017-05-06 DIAGNOSIS — N390 Urinary tract infection, site not specified: Secondary | ICD-10-CM | POA: Diagnosis not present

## 2017-05-07 DIAGNOSIS — N390 Urinary tract infection, site not specified: Secondary | ICD-10-CM | POA: Diagnosis not present

## 2017-05-07 DIAGNOSIS — T7840XD Allergy, unspecified, subsequent encounter: Secondary | ICD-10-CM | POA: Diagnosis not present

## 2017-05-07 DIAGNOSIS — I480 Paroxysmal atrial fibrillation: Secondary | ICD-10-CM | POA: Diagnosis not present

## 2017-05-07 DIAGNOSIS — E1122 Type 2 diabetes mellitus with diabetic chronic kidney disease: Secondary | ICD-10-CM | POA: Diagnosis not present

## 2017-05-08 DIAGNOSIS — S91105D Unspecified open wound of left lesser toe(s) without damage to nail, subsequent encounter: Secondary | ICD-10-CM | POA: Diagnosis not present

## 2017-05-08 DIAGNOSIS — L89621 Pressure ulcer of left heel, stage 1: Secondary | ICD-10-CM | POA: Diagnosis not present

## 2017-05-08 DIAGNOSIS — L89150 Pressure ulcer of sacral region, unstageable: Secondary | ICD-10-CM | POA: Diagnosis not present

## 2017-05-09 ENCOUNTER — Other Ambulatory Visit: Payer: Self-pay | Admitting: *Deleted

## 2017-05-09 DIAGNOSIS — I480 Paroxysmal atrial fibrillation: Secondary | ICD-10-CM | POA: Diagnosis not present

## 2017-05-09 DIAGNOSIS — N183 Chronic kidney disease, stage 3 (moderate): Secondary | ICD-10-CM | POA: Diagnosis not present

## 2017-05-09 DIAGNOSIS — E1122 Type 2 diabetes mellitus with diabetic chronic kidney disease: Secondary | ICD-10-CM | POA: Diagnosis not present

## 2017-05-09 DIAGNOSIS — I739 Peripheral vascular disease, unspecified: Secondary | ICD-10-CM | POA: Diagnosis not present

## 2017-05-09 NOTE — Patient Outreach (Signed)
[facility]) Care Management  05/09/2017  DEVERY ODWYER 1941/07/20 [id number]   Met with Vickii Chafe, SW at facilty She reports patient to discharge 05/10/17. She will discharge home with Palliative Care services through [facility] and Palliative care.   Plan to let [facility] care team know of discharge disposition and sign off Mary E. Laymond Purser, RN, BSN, [place] [phone]) Business Cell  [phone]) Toll Free Office

## 2017-06-11 ENCOUNTER — Encounter (HOSPITAL_COMMUNITY): Payer: PPO

## 2017-06-11 ENCOUNTER — Encounter: Payer: PPO | Admitting: Vascular Surgery

## 2017-06-11 ENCOUNTER — Other Ambulatory Visit (HOSPITAL_COMMUNITY): Payer: PPO

## 2017-07-02 ENCOUNTER — Other Ambulatory Visit: Payer: Self-pay | Admitting: Pharmacist

## 2017-07-02 NOTE — Patient Outreach (Signed)
[place] [facility]) Care Management  07/02/2017  [name] 01/11/1941 [id number]   Patient was called to follow up. HIPAA identifiers were obtained. Patient shared that Hospice has been called in to help her in her home and that a nurse comes to see her daily.  Patient confirmed she is no longer taking Eliquis which was the medication we were trying to obtain from the [place] Patient Assistance Program.  In addition, the patient said EMS had to be called for her this morning because she tried to walk but had to slide down onto the floor because her legs would not support her. She reported her nurse had just left her home and evaluated her post fall/incident.  Plan: Close patient's case as she now has Hospice.

## 2017-08-12 DEATH — deceased

## 2017-12-02 IMAGING — US US RENAL
1 series · 14 of 25 positions shown · non-contrast
Comparison: CT Abdomen and Pelvis 10/05/2015.

CLINICAL DATA: 76-year-old female with stage IV chronic renal
disease. Status post urinary bladder resection with ileal conduit.

EXAM:
RENAL / URINARY TRACT ULTRASOUND COMPLETE

[Series 1: us renal · 0.21mm/px · 14 of 55 slices shown]
[im 1/55]
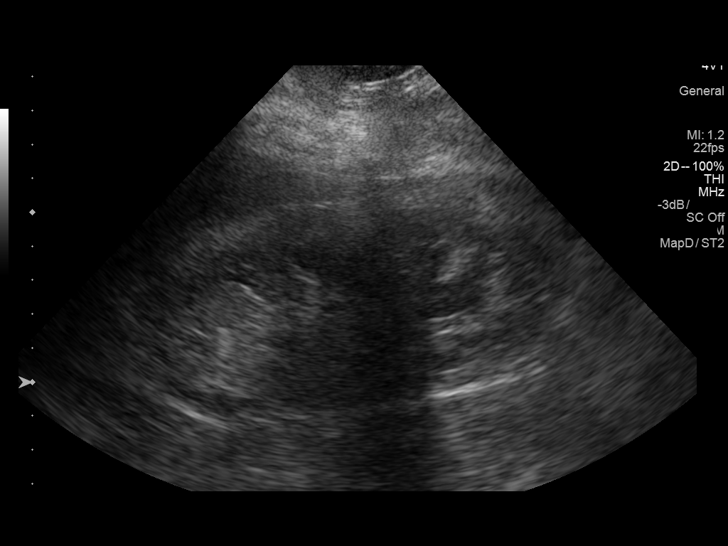
[im 5/55]
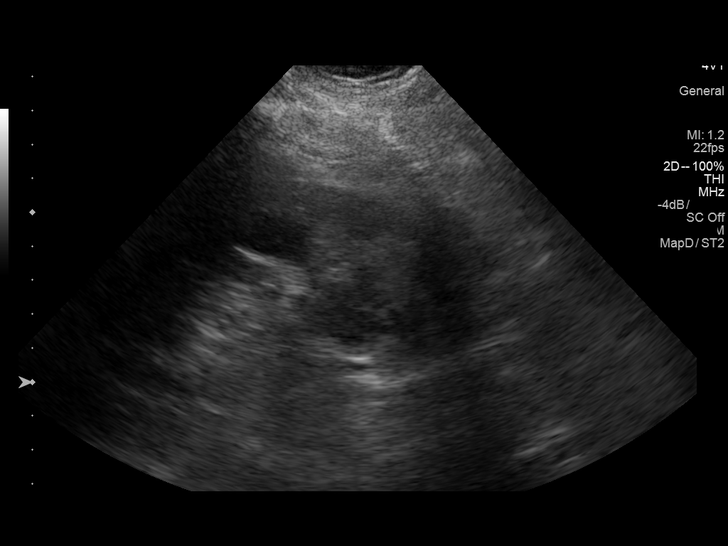
[im 10/55]
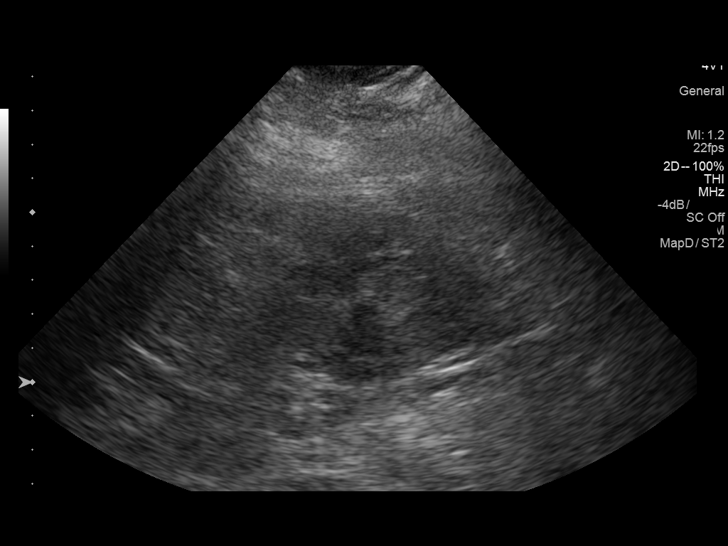
[im 14/55]
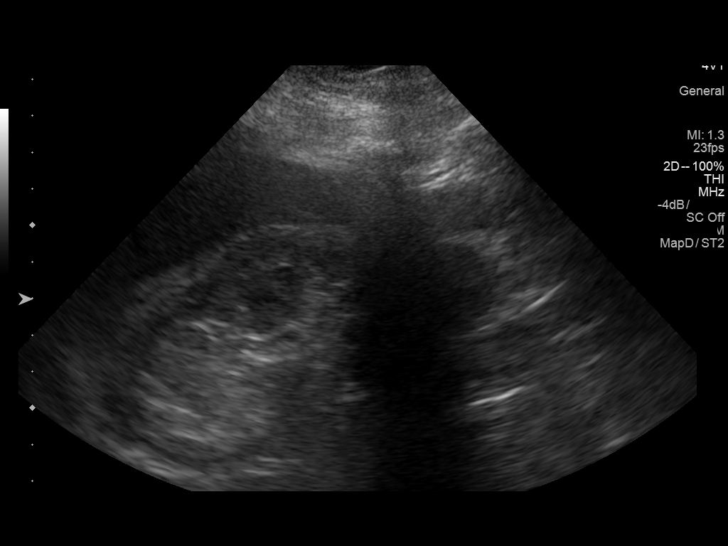
[im 19/55]
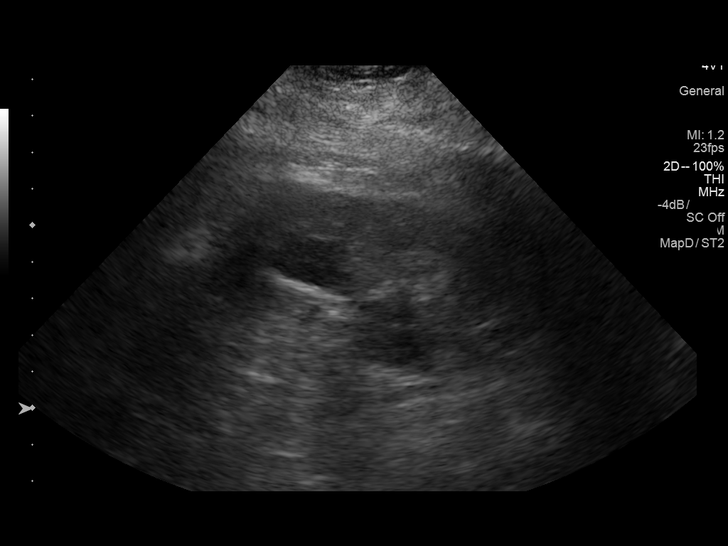
[im 21/55]
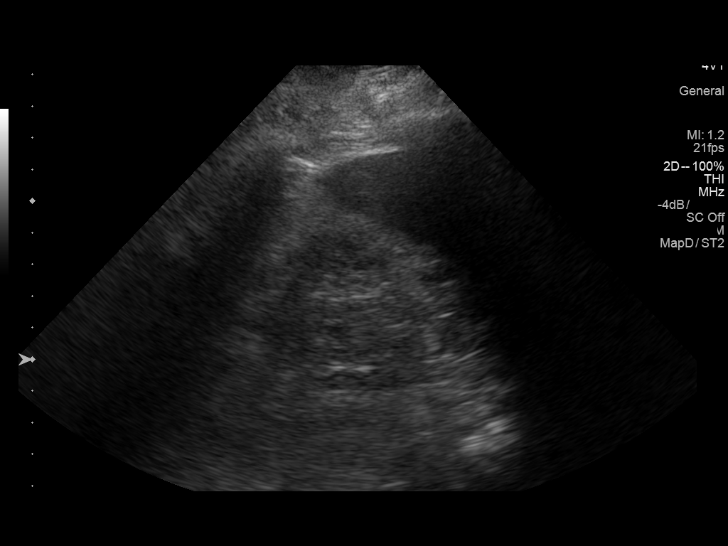
[im 25/55]
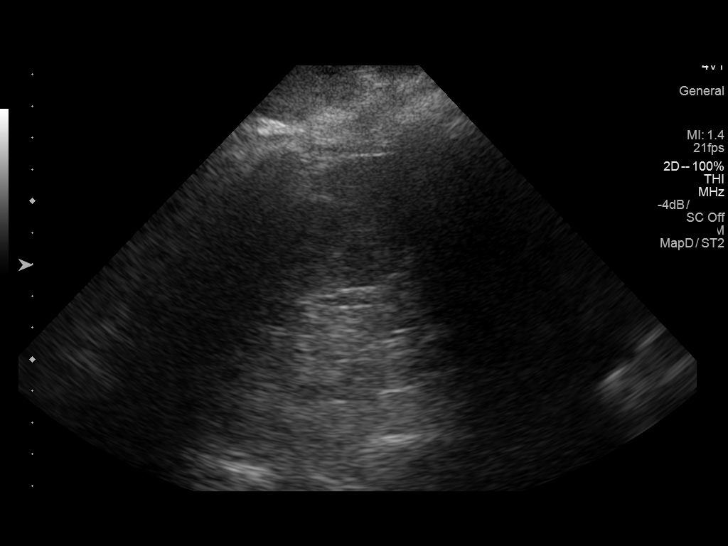
[im 30/55]
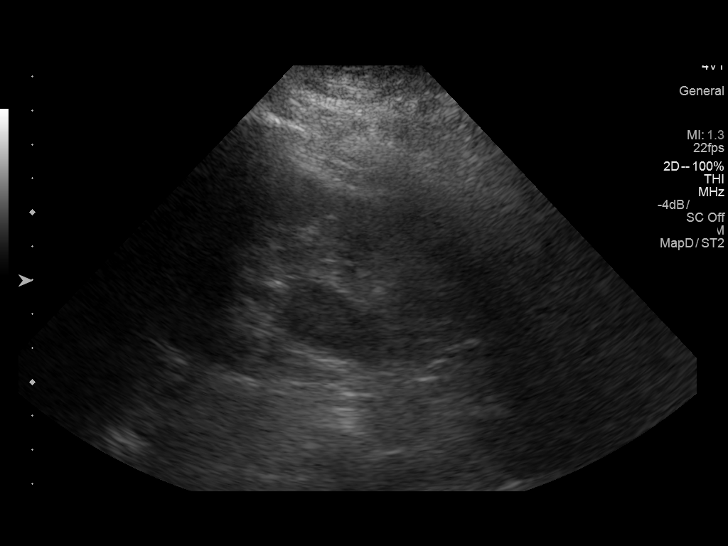
[im 34/55]
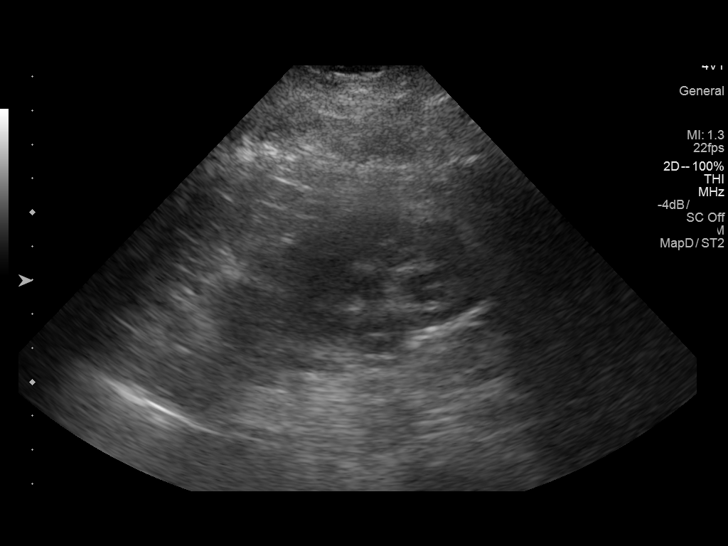
[im 37/55]
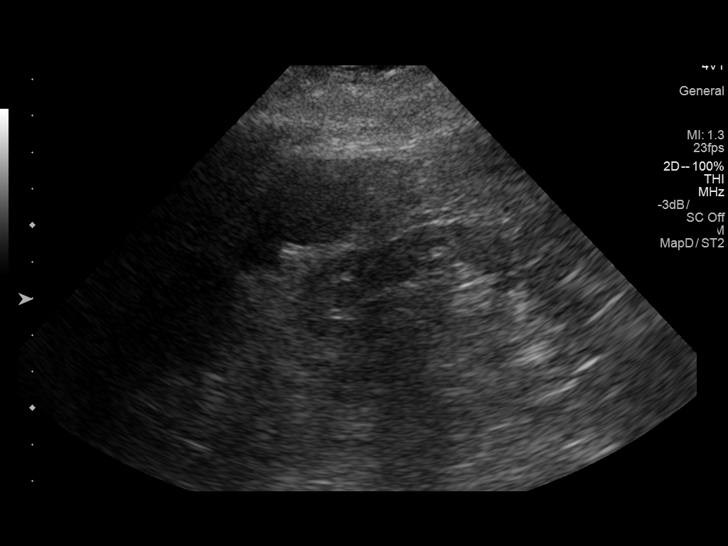
[im 41/55]
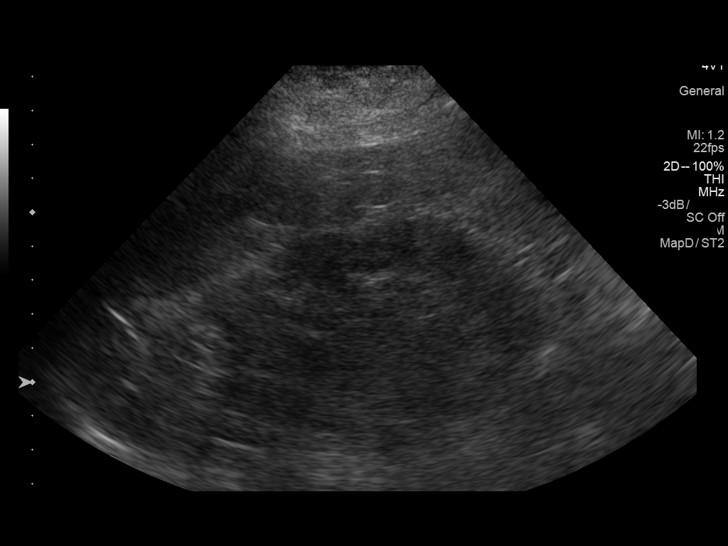
[im 46/55]
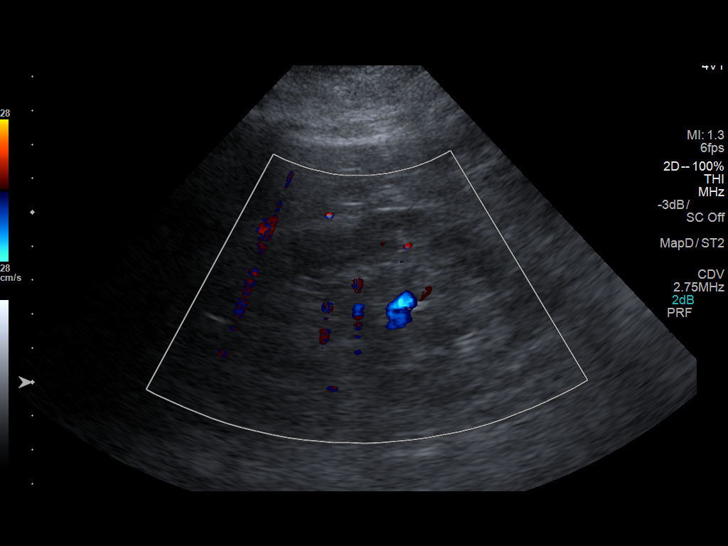
[im 50/55]
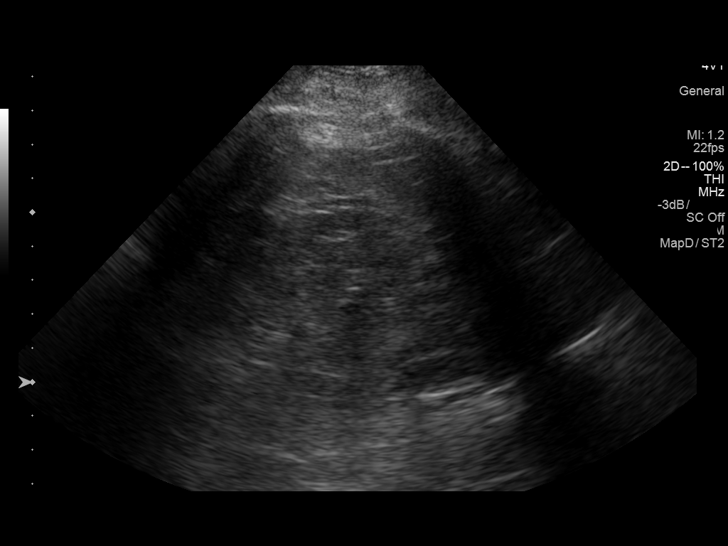
[im 55/55]
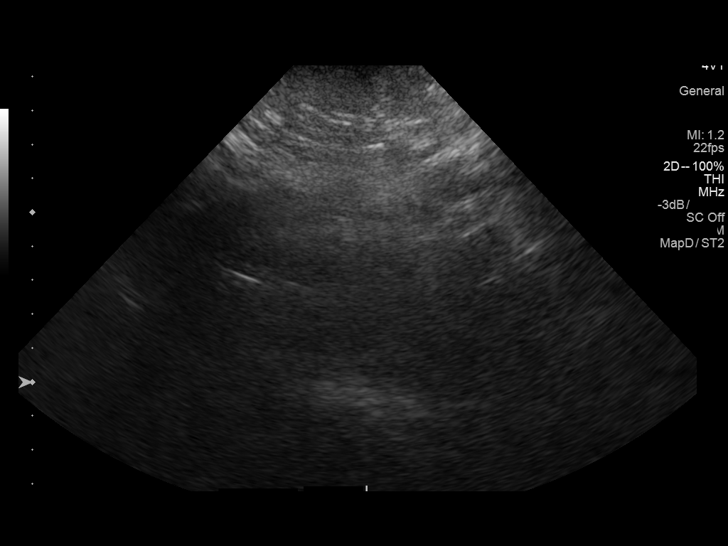

[14 of 25 positions shown; findings below may reference images not displayed]

FINDINGS: Right Kidney:

Length: Estimated at 13.0 cm. Renal cortical volume loss and/or
increased cortical echogenicity.

Prominent column of Bertin at the midpole is felt to be simulating a
mass on image 14 (compare to the prior CT including sagittal series
605, image 60).

Mild enlargement of the renal pelvis but no intrarenal collecting
system dilatation identified to confirm hydronephrosis.

Left Kidney:

Length: 11.1 cm. Renal cortical volume loss and/or increased
cortical echogenicity. No hydronephrosis.

Bladder:

Surgically absent
IMPRESSION: 1. No convincing hydronephrosis.
2. Renal cortical volume loss and/or increased cortical echogenicity
compatible with chronic medical renal disease.
3. Prominent right renal midpole column of Bertin.
